# Patient Record
Sex: Female | Born: 1975 | Race: Black or African American | Hispanic: No | Smoking: Former smoker
Health system: Southern US, Community
[De-identification: ages and names within clinical notes are randomized; demographics above are authoritative.]

## PROBLEM LIST (undated history)

## (undated) DIAGNOSIS — C801 Malignant (primary) neoplasm, unspecified: Secondary | ICD-10-CM

## (undated) DIAGNOSIS — I10 Essential (primary) hypertension: Secondary | ICD-10-CM

## (undated) DIAGNOSIS — F419 Anxiety disorder, unspecified: Secondary | ICD-10-CM

## (undated) DIAGNOSIS — F32A Depression, unspecified: Secondary | ICD-10-CM

## (undated) DIAGNOSIS — R011 Cardiac murmur, unspecified: Secondary | ICD-10-CM

## (undated) HISTORY — DX: Anxiety disorder, unspecified: F41.9

## (undated) HISTORY — PX: DILATION AND CURETTAGE OF UTERUS: SHX78

## (undated) HISTORY — DX: Depression, unspecified: F32.A

## (undated) MED FILL — Dexamethasone Sodium Phosphate Inj 100 MG/10ML: INTRAMUSCULAR | Qty: 1 | Status: AC

---

## 2014-04-18 ENCOUNTER — Emergency Department (HOSPITAL_COMMUNITY)
Admission: EM | Admit: 2014-04-18 | Discharge: 2014-04-18 | Disposition: A | Discharge status: Home / Self Care | Payer: Medicaid - Out of State | Attending: Emergency Medicine | Admitting: Emergency Medicine

## 2014-04-18 ENCOUNTER — Encounter (HOSPITAL_COMMUNITY): Payer: Self-pay | Admitting: Emergency Medicine

## 2014-04-18 ENCOUNTER — Telehealth (HOSPITAL_BASED_OUTPATIENT_CLINIC_OR_DEPARTMENT_OTHER): Payer: Self-pay | Admitting: *Deleted

## 2014-04-18 DIAGNOSIS — I10 Essential (primary) hypertension: Secondary | ICD-10-CM | POA: Insufficient documentation

## 2014-04-18 DIAGNOSIS — F17200 Nicotine dependence, unspecified, uncomplicated: Secondary | ICD-10-CM | POA: Insufficient documentation

## 2014-04-18 DIAGNOSIS — H01009 Unspecified blepharitis unspecified eye, unspecified eyelid: Secondary | ICD-10-CM | POA: Insufficient documentation

## 2014-04-18 DIAGNOSIS — H01003 Unspecified blepharitis right eye, unspecified eyelid: Secondary | ICD-10-CM

## 2014-04-18 HISTORY — DX: Essential (primary) hypertension: I10

## 2014-04-18 MED ORDER — ERYTHROMYCIN 5 MG/GM OP OINT: TOPICAL_OINTMENT | OPHTHALMIC | Status: DC

## 2014-04-18 MED ORDER — TETRACAINE HCL 0.5 % OP SOLN
2.0000 [drp] | Freq: Once | OPHTHALMIC | Status: AC
  Administered 2014-04-18: 2 [drp] via OPHTHALMIC
  Filled 2014-04-18: qty 2

## 2014-04-18 MED ORDER — FLUORESCEIN SODIUM 1 MG OP STRP
1.0000 | ORAL_STRIP | Freq: Once | OPHTHALMIC | Status: AC
  Administered 2014-04-18: 1 via OPHTHALMIC
  Filled 2014-04-18: qty 1

## 2014-04-18 NOTE — ED Provider Notes (Signed)
CSN: [id number]     Arrival date & time 04/18/14  [id number] History   First MD Initiated Contact with Patient 04/18/14 0904    This chart was scribed for Sheryl FinnerErin O'Malley PA-C, a non-physician practitioner working with Layla MawKristen N Ward, DO by Lewanda RifeAlexandra Hurtado, ED Scribe. This patient was seen in room TR04C/TR04C and the patient's care was started at 9:31 AM      Chief Complaint  Patient presents with  . Eye Pain  . Belepharitis     (Consider location/radiation/quality/duration/timing/severity/associated sxs/prior Treatment) The history is provided by the patient. No language interpreter was used.   HPI Comments: Sheryl NasutiCassandra Silva is a 38 y.o. female who presents to the Emergency Department complaining of constant right eye pain onset 2 days. States she works in a [employer] Describes pain as pruritic in nature and moderate in severity. Reports associated mild eye drainage, and foreign body sensation. Reports trying Visine with no relief of symptoms. Denies any aggravating factors. Denies associated fever, recent eye trauma, photophobia, and eye redness. Denies wearing contacts.   Past Medical History  Diagnosis Date  . Hypertension    Past Surgical History  Procedure Laterality Date  . Cesarean section     No family history on file. History  Substance Use Topics  . Smoking status: Current Every Day Smoker -- 0.50 packs/day    Types: Cigarettes  . Smokeless tobacco: Not on file  . Alcohol Use: No   OB History   Grav Para Term Preterm Abortions TAB SAB Ect Mult Living                 Review of Systems  Constitutional: Negative for fever.  Eyes: Positive for pain, discharge and itching. Negative for photophobia.      Allergies  Review of patient's allergies indicates no known allergies.  Home Medications   Prior to Admission medications   Medication Sig Start Date End Date Taking? Authorizing Provider  erythromycin ophthalmic ointment Place a 1/2 inch ribbon of ointment  into the lower eyelid. Continue at least 24 hours after symptoms have resolved. 04/18/14   Sheryl FinnerErin O'Malley, PA-C   BP 159/89  Pulse 66  Temp(Src) 98.9 F (37.2 C) (Oral)  Resp 18  Ht 5\' 7"  (1.702 m)  Wt 180 lb (81.647 kg)  BMI 28.19 kg/m2  SpO2 100%  LMP 03/21/2014 Physical Exam  Nursing note and vitals reviewed. Constitutional: She is oriented to person, place, and time. She appears well-developed and well-nourished.  HENT:  Head: Normocephalic and atraumatic.  Eyes: EOM and lids are normal. Pupils are equal, round, and reactive to light. Lids are everted and swept, no foreign bodies found. Right eye exhibits no chemosis, no discharge, no exudate and no hordeolum. No foreign body present in the right eye. Left eye exhibits no chemosis, no discharge, no exudate and no hordeolum. No foreign body present in the left eye. Right conjunctiva is not injected. No scleral icterus.  Slit lamp exam:      The right eye shows no corneal abrasion, no corneal flare, no corneal ulcer, no foreign body, no hyphema, no hypopyon and no fluorescein uptake.  Right eyelid-mild erythema and edema with tenderness.    Visual Acuity  Right Eye Distance: 20/70 Left Eye Distance: 20/50 Bilateral Distance: 20/50   Neck: Normal range of motion.  Cardiovascular: Normal rate.   Pulmonary/Chest: Effort normal.  Musculoskeletal: Normal range of motion.  Neurological: She is alert and oriented to person, place, and time.  Skin: Skin is  warm and dry.  Psychiatric: She has a normal mood and affect. Her behavior is normal.    ED Course  Procedures (including critical care time) COORDINATION OF CARE:  Nursing notes reviewed. Vital signs reviewed. Initial pt interview and examination performed.   Filed Vitals:   04/18/14 0904  BP: 159/89  Pulse: 66  Temp: 98.9 F (37.2 C)  TempSrc: Oral  Resp: 18  Height: 5\' 7"  (1.702 m)  Weight: 180 lb (81.647 kg)  SpO2: 100%    9:31 AM-Discussed work up plan with pt  at bedside, which includes  Orders Placed This Encounter  Procedures  . Visual acuity screening    Standing Status: Standing     Number of Occurrences: 1     Standing Expiration Date:   . Pt agrees with plan.   Treatment plan initiated: Medications  tetracaine (PONTOCAINE) 0.5 % ophthalmic solution 2 drop (2 drops Right Eye Given 04/18/14 0919)  fluorescein ophthalmic strip 1 strip (1 strip Right Eye Given 04/18/14 0920)     Initial diagnostic testing ordered.       Labs Review Labs Reviewed - No data to display  Imaging Review No results found.   EKG Interpretation None      MDM   Final diagnoses:  Blepharitis of right eye   Pt is a 38yo female c/o 2 day hx of right eye erythema, irritation and edema with foreign body sensation. No foreign body seen on exam.  No corneal abrasion. Signs and symptoms consistent with blepharitis. Will tx with erythromycin ointment and advised to use warm compresses. Advised to f/u with Dr. Gwen PoundsKowalski, ophthalmology, in 2-3 days if not improving. Return precautions provided. Pt verbalized understanding and agreement with tx plan.    I personally performed the services described in this documentation, which was scribed in my presence. The recorded information has been reviewed and is accurate.    Sheryl FinnerErin O'Malley, PA-C 04/18/14 [phone]

## 2014-04-18 NOTE — ED Notes (Signed)
Patient refused wheelchair.  Walked patient out.

## 2014-04-18 NOTE — ED Notes (Signed)
Patient states started having eyelid swelling about 2 days ago.  Patient states some minor drainage.

## 2014-04-18 NOTE — ED Provider Notes (Signed)
Medical screening examination/treatment/procedure(s) were performed by non-physician practitioner and as supervising physician I was immediately available for consultation/collaboration.   EKG Interpretation None        Layla MawKristen N Ward, DO 04/18/14 1244

## 2014-04-18 NOTE — Discharge Instructions (Signed)
Blepharitis Blepharitis is redness, soreness, and swelling (inflammation) of one or both eyelids. It may be caused by an allergic reaction or a bacterial infection. Blepharitis may also be associated with reddened, scaly skin (seborrhea) of the scalp and eyebrows. While you sleep, eye discharge may cause your eyelashes to stick together. Your eyelids may itch, burn, swell, and may lose their lashes. These will grow back. Your eyes may become sensitive. Blepharitis may recur and need repeated treatment. If this is the case, you may require further evaluation by an eye specialist (ophthalmologist). HOME CARE INSTRUCTIONS   Keep your hands clean.  Use a clean towel each time you dry your eyelids. Do not use this towel to clean other areas. Do not share a towel or makeup with anyone.  Wash your eyelids with warm water or warm water mixed with a small amount of baby shampoo. Do this twice a day or as often as needed.  Wash your face and eyebrows at least once a day.  Use warm compresses 2 times a day for 10 minutes at a time, or as directed by your caregiver.  Apply antibiotic ointment as directed by your caregiver.  Avoid rubbing your eyes.  Avoid wearing makeup until you get better.  Follow up with your caregiver as directed. SEEK IMMEDIATE MEDICAL CARE IF:   You have pain, redness, or swelling that gets worse or spreads to other parts of your face.  Your vision changes, or you have pain when looking at lights or moving objects.  You have a fever.  Your symptoms continue for longer than 2 to 4 days or become worse. MAKE SURE YOU:   Understand these instructions.  Will watch your condition.  Will get help right away if you are not doing well or get worse. Document Released: 10/15/2000 Document Revised: 01/10/2012 Document Reviewed: 11/25/2010 ExitCare Patient Information 2015 [employer]. This information is not intended to replace advice given to you by your health care  provider. Make sure you discuss any questions you have with your health care provider.  

## 2014-04-25 ENCOUNTER — Emergency Department (HOSPITAL_COMMUNITY)
Admission: EM | Admit: 2014-04-25 | Discharge: 2014-04-25 | Disposition: A | Discharge status: Home / Self Care | Payer: Medicaid - Out of State | Attending: Emergency Medicine | Admitting: Emergency Medicine

## 2014-04-25 ENCOUNTER — Encounter (HOSPITAL_COMMUNITY): Payer: Self-pay | Admitting: Emergency Medicine

## 2014-04-25 DIAGNOSIS — F17200 Nicotine dependence, unspecified, uncomplicated: Secondary | ICD-10-CM | POA: Insufficient documentation

## 2014-04-25 DIAGNOSIS — Z79899 Other long term (current) drug therapy: Secondary | ICD-10-CM | POA: Insufficient documentation

## 2014-04-25 DIAGNOSIS — H00013 Hordeolum externum right eye, unspecified eyelid: Secondary | ICD-10-CM

## 2014-04-25 DIAGNOSIS — H00019 Hordeolum externum unspecified eye, unspecified eyelid: Secondary | ICD-10-CM | POA: Insufficient documentation

## 2014-04-25 DIAGNOSIS — I10 Essential (primary) hypertension: Secondary | ICD-10-CM | POA: Insufficient documentation

## 2014-04-25 NOTE — ED Notes (Signed)
Pt from home with c/o worsening eye lid swelling.  Pt states she was seen last week for the same but it is getting worse and is more painful.  Pt in NAD, A&O.

## 2014-04-25 NOTE — Discharge Instructions (Signed)

## 2014-04-25 NOTE — ED Notes (Signed)
Observed room was empty and pt was gone.  Discussed with PA Konrad DoloresMerrell, who stated she had not seen the pt recently and believed the pt has left already.  Pt left prior to discharge paperwork and additional vital signs.

## 2014-04-25 NOTE — ED Provider Notes (Signed)
Medical screening examination/treatment/procedure(s) were performed by non-physician practitioner and as supervising physician I was immediately available for consultation/collaboration.   EKG Interpretation None        Joya Gaskinsonald W Wickline, MD 04/25/14 2001

## 2014-04-25 NOTE — ED Provider Notes (Signed)
CSN: [id number]     Arrival date & time 04/25/14  0803 History   First MD Initiated Contact with Patient 04/25/14 [phone]     Chief Complaint  Patient presents with  . Facial Swelling     (Consider location/radiation/quality/duration/timing/severity/associated sxs/prior Treatment) HPI Comments: Patient is a 38 year old female with history of hypertension who presents today with right eyelid swelling. She reports that she was seen last week for similar symptoms. When she was seen previously she was having pain, itching, drainage. She was given erythromycin ointment and instructed to use warm compresses. She has been using these medications as prescribed. She denies any blurry vision, double vision, photophobia, eye redness. She does not wear contacts. She reports that her symptoms have generally subsided with the exception of the unsightly swelling to the top of her eyelid. She was unable to see an ophthalmologist because of her insurance. She has out of state Medicaid.   The history is provided by the patient. No language interpreter was used.    Past Medical History  Diagnosis Date  . Hypertension    Past Surgical History  Procedure Laterality Date  . Cesarean section     No family history on file. History  Substance Use Topics  . Smoking status: Current Every Day Smoker -- 0.50 packs/day    Types: Cigarettes  . Smokeless tobacco: Never Used  . Alcohol Use: No   OB History   Grav Para Term Preterm Abortions TAB SAB Ect Mult Living                 Review of Systems  Constitutional: Negative for fever and chills.  Eyes: Negative for photophobia, pain, discharge, redness, itching and visual disturbance.       Eyelid swelling  Gastrointestinal: Negative for nausea and vomiting.  All other systems reviewed and are negative.     Allergies  Review of patient's allergies indicates no known allergies.  Home Medications   Prior to Admission medications   Medication Sig Start  Date End Date Taking? Authorizing Provider  atenolol-chlorthalidone (TENORETIC) 50-25 MG per tablet Take 1 tablet by mouth daily.  01/23/14  Yes Historical Provider, MD  ibuprofen (ADVIL,MOTRIN) 800 MG tablet Take 800 mg by mouth every 8 (eight) hours as needed for headache or cramping.  01/25/14  Yes Historical Provider, MD   BP 127/72  Temp(Src) 98.2 F (36.8 C) (Oral)  Resp 18  Ht 5\' 6"  (1.676 m)  Wt 180 lb (81.647 kg)  BMI 29.07 kg/m2  LMP 04/24/2014 Physical Exam  Nursing note and vitals reviewed. Constitutional: She is oriented to person, place, and time. She appears well-developed and well-nourished. No distress.  Patient is very well appearing  HENT:  Head: Normocephalic and atraumatic.  Right Ear: External ear normal.  Left Ear: External ear normal.  Nose: Nose normal.  Mouth/Throat: Oropharynx is clear and moist.  Eyes: Conjunctivae and EOM are normal. Pupils are equal, round, and reactive to light. Right eye exhibits hordeolum. Right eye exhibits no chemosis, no discharge and no exudate. No foreign body present in the right eye. Left eye exhibits no chemosis, no discharge and no exudate. No foreign body present in the left eye. Right conjunctiva is not injected. Right conjunctiva has no hemorrhage. Left conjunctiva is not injected. Left conjunctiva has no hemorrhage. Right eye exhibits normal extraocular motion and no nystagmus. Left eye exhibits normal extraocular motion and no nystagmus. Right pupil is round and reactive. Left pupil is round and reactive.  Right eyelid  with mild swelling to mid lid near eyelashes. No tenderness to palpation. No surrounding orbital erythema, streaking.  Neck: Normal range of motion.  Cardiovascular: Normal rate, regular rhythm and normal heart sounds.   Pulmonary/Chest: Effort normal and breath sounds normal. No stridor. No respiratory distress. She has no wheezes. She has no rales.  Abdominal: Soft. She exhibits no distension.  Musculoskeletal:  Normal range of motion.  Neurological: She is alert and oriented to person, place, and time. She has normal strength.  Skin: Skin is warm and dry. She is not diaphoretic. No erythema.  Psychiatric: She has a normal mood and affect. Her behavior is normal.    ED Course  Procedures (including critical care time) Labs Review Labs Reviewed - No data to display  Imaging Review No results found.   EKG Interpretation None      MDM   Final diagnoses:  Stye external, right   Patient presents to ED for evaluation of right eye swelling. Patient with small, focal swelling to right mid eyelid, along eyelash line. No active drainage or warmth. Patient without symptoms other than this swelling. Patient has been using warm compresses 2 x a day and erythromycin ointment. She cannot see opthalmology due to insurance issues. She becomes very angry with my diagnosis and states I am withholding care and not letting her see a specialist in the ED due to her insurance. I explained to patient this was not the case, that there were no in house opthamologist and that there were no emergent conditions present requiring the opthamologist to come to the ED. I offered Dr. Bebe ShaggyWickline to evaluate patient for second opinion. Patient agreed to this plan, but eloped from the department shortly after. He was unable to evaluate this patient. She eloped from the department before a visual acuity was able to be obtained. Patient without complaints of visual disturbance and I believe patient can still be discharged in good faith.  I encouraged patient to follow up with opthalmology, increase number of warm compresses done daily, and return to the ED for any new pain, swelling, visual disturbance, or any symptom that is concerning to her. Vital signs stable for discharge. Patient / Family / Caregiver informed of clinical course, understand medical decision-making process, and agree with plan.     Mora BellmanHannah S Kylar Speelman, PA-C 04/25/14  1334

## 2017-03-25 ENCOUNTER — Encounter: Payer: Self-pay | Admitting: Emergency Medicine

## 2017-03-25 ENCOUNTER — Emergency Department
Admission: EM | Admit: 2017-03-25 | Discharge: 2017-03-25 | Disposition: A | Discharge status: Home / Self Care | Payer: BLUE CROSS/BLUE SHIELD | Attending: Emergency Medicine | Admitting: Emergency Medicine

## 2017-03-25 DIAGNOSIS — F329 Major depressive disorder, single episode, unspecified: Secondary | ICD-10-CM | POA: Insufficient documentation

## 2017-03-25 DIAGNOSIS — F17210 Nicotine dependence, cigarettes, uncomplicated: Secondary | ICD-10-CM | POA: Insufficient documentation

## 2017-03-25 DIAGNOSIS — F32A Depression, unspecified: Secondary | ICD-10-CM

## 2017-03-25 DIAGNOSIS — I10 Essential (primary) hypertension: Secondary | ICD-10-CM | POA: Insufficient documentation

## 2017-03-25 DIAGNOSIS — F419 Anxiety disorder, unspecified: Secondary | ICD-10-CM | POA: Diagnosis not present

## 2017-03-25 MED ORDER — ESCITALOPRAM OXALATE 10 MG PO TABS: 10.0000 mg | ORAL_TABLET | Freq: Every day | ORAL | 2 refills | Status: DC

## 2017-03-25 MED ORDER — LORAZEPAM 0.5 MG PO TABS: 0.5000 mg | ORAL_TABLET | Freq: Three times a day (TID) | ORAL | 0 refills | Status: AC | PRN

## 2017-03-25 MED ORDER — LORAZEPAM 1 MG PO TABS
1.0000 mg | ORAL_TABLET | Freq: Once | ORAL | Status: AC
  Administered 2017-03-25: 1 mg via ORAL
  Filled 2017-03-25: qty 1

## 2017-03-25 NOTE — ED Notes (Addendum)
Pt dressed out into appropriate behavioral health clothing. Pt belongings consist of a white necklace with a cross, a pair of white ball earrings, a colorful purse,a cell phone, white flip flops, a pair of blue jeans, a black belt, a black shirt, a black bra and a pair of Menna panties. Pt boyfriend Melina ModenaJerome Payton took all pt belongings with him to lobby.

## 2017-03-25 NOTE — ED Notes (Signed)
TTS with pt  

## 2017-03-25 NOTE — ED Notes (Signed)
Pt. Verbalizes understanding of d/c instructions and follow-up. VS stable and pain controlled per pt.  Pt. In NAD at time of d/c and denies further concerns regarding this visit. Resources provided by TTS. Pt. Stable at the time of departure from the unit, departing unit by the safest and most appropriate manner per that pt condition and limitations. Pt advised to return to the ED at any time for emergent concerns, or for new/worsening symptoms.
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
## 2017-03-25 NOTE — ED Notes (Signed)
Discussed patient with ER MD (Dr. Rance MuirWillams) and patient is able to discharge home when medically cleared. Patient was giving referral information and instructions on how to follow up with Outpatient Treatment ([facility] and [facility]) and Mobile Crisis.  Writer also advised the patient to call the toll free phone on his insurance card for the counselors and agencies in his network.  Patient advised to call the [facility] through their job to help with Outpatient Treatment.  Patient denies SI/HI and AV/H.

## 2017-03-25 NOTE — ED Notes (Signed)
Melina ModenaJerome Payton (pt boyfriend) [phone].

## 2017-03-25 NOTE — ED Triage Notes (Signed)
Pt to ED via POV for c/o depression and generalized body aches. Pt states that she has been feeling depressed for the last 3-4 days. Pt states that she has been under a lot of stress recently. Pt states that she has barely been able to get out of the bed the past few days. Pt states that she is unable a lot of stress at her job because the "environment is too much". Pt denies SI/HI.

## 2017-05-23 DIAGNOSIS — Z720 Tobacco use: Secondary | ICD-10-CM | POA: Insufficient documentation

## 2017-05-23 DIAGNOSIS — R7989 Other specified abnormal findings of blood chemistry: Secondary | ICD-10-CM | POA: Insufficient documentation

## 2017-08-12 ENCOUNTER — Encounter: Payer: Self-pay | Admitting: Emergency Medicine

## 2017-08-12 ENCOUNTER — Emergency Department
Admission: EM | Admit: 2017-08-12 | Discharge: 2017-08-12 | Disposition: A | Discharge status: Home / Self Care | Payer: BLUE CROSS/BLUE SHIELD | Attending: Emergency Medicine | Admitting: Emergency Medicine

## 2017-08-12 DIAGNOSIS — I10 Essential (primary) hypertension: Secondary | ICD-10-CM | POA: Diagnosis not present

## 2017-08-12 DIAGNOSIS — F17210 Nicotine dependence, cigarettes, uncomplicated: Secondary | ICD-10-CM | POA: Insufficient documentation

## 2017-08-12 DIAGNOSIS — N939 Abnormal uterine and vaginal bleeding, unspecified: Secondary | ICD-10-CM | POA: Diagnosis not present

## 2017-08-12 DIAGNOSIS — Z79899 Other long term (current) drug therapy: Secondary | ICD-10-CM | POA: Diagnosis not present

## 2017-08-12 LAB — CBC WITH DIFFERENTIAL/PLATELET
Basophils Absolute: 0.1 10*3/uL (ref 0–0.1)
Basophils Relative: 1 %
Eosinophils Absolute: 0.1 10*3/uL (ref 0–0.7)
Eosinophils Relative: 2 %
HEMATOCRIT: 40.2 % (ref 35.0–47.0)
HEMOGLOBIN: 13.9 g/dL (ref 12.0–16.0)
LYMPHS PCT: 44 %
Lymphs Abs: 2.8 10*3/uL (ref 1.0–3.6)
MCH: 32.1 pg (ref 26.0–34.0)
MCHC: 34.6 g/dL (ref 32.0–36.0)
MCV: 92.7 fL (ref 80.0–100.0)
Monocytes Absolute: 0.4 10*3/uL (ref 0.2–0.9)
Monocytes Relative: 6 %
NEUTROS ABS: 2.9 10*3/uL (ref 1.4–6.5)
Neutrophils Relative %: 47 %
Platelets: 251 10*3/uL (ref 150–440)
RBC: 4.34 MIL/uL (ref 3.80–5.20)
RDW: 13 % (ref 11.5–14.5)
WBC: 6.3 10*3/uL (ref 3.6–11.0)

## 2017-08-12 LAB — URINALYSIS, COMPLETE (UACMP) WITH MICROSCOPIC
Bilirubin Urine: NEGATIVE
Glucose, UA: NEGATIVE mg/dL
KETONES UR: NEGATIVE mg/dL
Nitrite: NEGATIVE
PH: 6 (ref 5.0–8.0)
Protein, ur: NEGATIVE mg/dL
Specific Gravity, Urine: 1.006 (ref 1.005–1.030)

## 2017-08-12 LAB — BASIC METABOLIC PANEL
ANION GAP: 6 (ref 5–15)
BUN: 9 mg/dL (ref 6–20)
CO2: 26 mmol/L (ref 22–32)
Calcium: 9.5 mg/dL (ref 8.9–10.3)
Chloride: 107 mmol/L (ref 101–111)
Creatinine, Ser: 1.07 mg/dL — ABNORMAL HIGH (ref 0.44–1.00)
GFR calc Af Amer: >60 mL/min (ref >60)
GFR calc non Af Amer: >60 mL/min (ref >60)
Glucose, Bld: 94 mg/dL (ref 65–99)
POTASSIUM: 4.4 mmol/L (ref 3.5–5.1)
Sodium: 139 mmol/L (ref 135–145)

## 2017-08-12 LAB — POCT PREGNANCY, URINE: Preg Test, Ur: NEGATIVE

## 2017-08-12 MED ORDER — TRANEXAMIC ACID 650 MG PO TABS: 1300.0000 mg | ORAL_TABLET | Freq: Three times a day (TID) | ORAL | 0 refills | Status: AC

## 2017-08-12 MED ORDER — DICYCLOMINE HCL 20 MG PO TABS: 20.0000 mg | ORAL_TABLET | Freq: Three times a day (TID) | ORAL | 0 refills | Status: DC | PRN

## 2017-08-12 NOTE — ED Notes (Signed)
Pt alert and oriented X4, active, cooperative, pt in NAD. RR even and unlabored, color WNL.  Pt informed to return if any life threatening symptoms occur.  Discharge and followup instructions reviewed.  

## 2017-08-12 NOTE — ED Provider Notes (Signed)
[facility] Emergency Department Provider Note  ____________________________________________   I have reviewed the triage vital signs and the nursing notes.   HISTORY  Chief Complaint Vaginal Bleeding   History limited by: Not Limited   HPI Sheryl Silva is a 41 y.o. female who presents to the emergency department today because of continued vaginal bleeding.   LOCATION:vaginal DURATION:1 month TIMING: constant QUALITY: blood clots CONTEXT: patient has been having bleeding for one month. Roughly 2 weeks ago saw an ob/gyn doctor. States she had US done and a biopsy without abnormal findings. Was placed on Provera.  MODIFYING FACTORS: Provera has not helped the bleeding ASSOCIATED SYMPTOMS: abdominal discomfort.  Per medical record review patient has a history of irregular periods.  Past Medical History:  Diagnosis Date  . Hypertension     There are no active problems to display for this patient.   Past Surgical History:  Procedure Laterality Date  . CESAREAN SECTION      Prior to Admission medications   Medication Sig Start Date End Date Taking? Authorizing Provider  atenolol-chlorthalidone (TENORETIC) 50-25 MG per tablet Take 1 tablet by mouth daily.  01/23/14   [provider]  escitalopram (LEXAPRO) 10 MG tablet Take 1 tablet (10 mg total) by mouth daily. 03/25/17 03/25/18  Emily Filbert, MD  ibuprofen (ADVIL,MOTRIN) 800 MG tablet Take 800 mg by mouth every 8 (eight) hours as needed for headache or cramping.  01/25/14   [provider]  LORazepam (ATIVAN) 0.5 MG tablet Take 1 tablet (0.5 mg total) by mouth every 8 (eight) hours as needed for anxiety. 03/25/17 03/25/18  Emily Filbert, MD    Allergies Patient has no known allergies.  No family history on file.  Social History Social History  Substance Use Topics  . Smoking status: Current Every Day Smoker    Packs/day: 0.50    Types: Cigarettes  .  Smokeless tobacco: Never Used  . Alcohol use Yes     Comment: occ    Review of Systems Constitutional: No fever/chills Eyes: No visual changes. ENT: No sore throat. Cardiovascular: Denies chest pain. Respiratory: Denies shortness of breath. Gastrointestinal: Positive for abdominal pain. Genitourinary: Positive for vaginal bleeding. Musculoskeletal: Negative for back pain. Skin: Negative for rash. Neurological: Negative for headaches, focal weakness or numbness.  ____________________________________________   PHYSICAL EXAM:  VITAL SIGNS: ED Triage Vitals  Enc Vitals Group     BP 08/12/17 1716 (!) 162/98     Pulse Rate 08/12/17 1716 72     Resp 08/12/17 1716 16     Temp 08/12/17 1716 98.9 F (37.2 C)     Temp Source 08/12/17 1716 Oral     SpO2 08/12/17 1716 100 %     Weight 08/12/17 1718 180 lb (81.6 kg)     Height --      Head Circumference --      Peak Flow --      Pain Score 08/12/17 1716 9    Constitutional: Alert and oriented. Well appearing and in no distress. Eyes: Conjunctivae are normal.  ENT   Head: Normocephalic and atraumatic.   Nose: No congestion/rhinnorhea.   Mouth/Throat: Mucous membranes are moist.   Neck: No stridor. Hematological/Lymphatic/Immunilogical: No cervical lymphadenopathy. Cardiovascular: Normal rate, regular rhythm.  No murmurs, rubs, or gallops.  Respiratory: Normal respiratory effort without tachypnea nor retractions. Breath sounds are clear and equal bilaterally. No wheezes/rales/rhonchi. Gastrointestinal: Soft and non tender. No rebound. No guarding.  Genitourinary: Deferred Musculoskeletal: Normal range  of motion in all extremities. No lower extremity edema. Neurologic:  Normal speech and language. No gross focal neurologic deficits are appreciated.  Skin:  Skin is warm, dry and intact. No rash noted. Psychiatric: Mood and affect are normal. Speech and behavior are normal. Patient exhibits appropriate insight and  judgment.  ____________________________________________    LABS (pertinent positives/negatives)  preg neg Cbc wnl Bmp cr 1.07 ua with trace leukocytes, no wbcs. 6-30 rbcs.  ____________________________________________   EKG  None  ____________________________________________    RADIOLOGY  None  ____________________________________________   PROCEDURES  Procedures  ____________________________________________   INITIAL IMPRESSION / ASSESSMENT AND PLAN / ED COURSE  Pertinent labs & imaging results that were available during my care of the patient were reviewed by me and considered in my medical decision making (see chart for details).  patient presents to the emergency department today because of concerns for vaginal bleeding and abdominal pain. Differential would include cancer, fibroids, abnormal endometrial growth among other etiologies. Patient did state however she had an ultrasound and biopsy done roughly 2 weeks ago which were both normal. She was placed on Provera. I discussed with patient about importance of continuing OB/GYN follow-up. Blood work today without any concerning anemia from the bleeding. Did discuss other medications to try for abnormal uterine bleeding. Did discussed the x-ray. Patient did put on a hormonal treatment without relief. Discuss possible risk of other blood clots developing with TX a. Patient however would like to try. Also give prescription for Bentyl. Discussed with patient importance of OB/GYN follow-up.  ____________________________________________   FINAL CLINICAL IMPRESSION(S) / ED DIAGNOSES  Final diagnoses:  Abnormal uterine bleeding (AUB)     Note: This dictation was prepared with Dragon dictation. Any transcriptional errors that result from this process are unintentional     Phineas Semen, MD 08/12/17 2100

## 2017-08-12 NOTE — ED Triage Notes (Signed)
Pt to ED via POV c/o vaginal bleeding x 30 days. Pt states that she seen her OBGYN when she was in [place] 2 weeks ago and was given Provera. Pt states that she has 4 days left but she is still bleeding. Pt had an ultrasound done and states that it came back ok. Pt states that she is passing some clots. Pt states that she is using 2 pads per hours at times but her flow varies.

## 2017-08-12 NOTE — Discharge Instructions (Signed)
Please stop taking your oral hormone treatment and start taking the medication prescribed today. Please talk to your primary care doctor about following up with an ob/gyn. Please seek medical attention for any high fevers, chest pain, shortness of breath, change in behavior, persistent vomiting, bloody stool or any other new or concerning symptoms.

## 2018-09-27 ENCOUNTER — Other Ambulatory Visit: Payer: Self-pay

## 2018-09-27 ENCOUNTER — Emergency Department
Admission: EM | Admit: 2018-09-27 | Discharge: 2018-09-27 | Disposition: A | Discharge status: Home / Self Care | Payer: BLUE CROSS/BLUE SHIELD | Attending: Emergency Medicine | Admitting: Emergency Medicine

## 2018-09-27 DIAGNOSIS — I10 Essential (primary) hypertension: Secondary | ICD-10-CM | POA: Insufficient documentation

## 2018-09-27 DIAGNOSIS — Z79899 Other long term (current) drug therapy: Secondary | ICD-10-CM | POA: Insufficient documentation

## 2018-09-27 DIAGNOSIS — R519 Headache, unspecified: Secondary | ICD-10-CM

## 2018-09-27 DIAGNOSIS — F17210 Nicotine dependence, cigarettes, uncomplicated: Secondary | ICD-10-CM | POA: Insufficient documentation

## 2018-09-27 DIAGNOSIS — R51 Headache: Secondary | ICD-10-CM | POA: Insufficient documentation

## 2018-09-27 LAB — COMPREHENSIVE METABOLIC PANEL
ALK PHOS: 46 U/L (ref 38–126)
ALT: 22 U/L (ref 0–44)
AST: 24 U/L (ref 15–41)
Albumin: 4 g/dL (ref 3.5–5.0)
Anion gap: 10 (ref 5–15)
BUN: 15 mg/dL (ref 6–20)
CALCIUM: 9.2 mg/dL (ref 8.9–10.3)
CHLORIDE: 104 mmol/L (ref 98–111)
CO2: 23 mmol/L (ref 22–32)
CREATININE: 0.95 mg/dL (ref 0.44–1.00)
GFR calc Af Amer: >60 mL/min (ref >60)
GFR calc non Af Amer: >60 mL/min (ref >60)
Glucose, Bld: 94 mg/dL (ref 70–99)
Potassium: 4.3 mmol/L (ref 3.5–5.1)
Sodium: 137 mmol/L (ref 135–145)
Total Bilirubin: 0.4 mg/dL (ref 0.3–1.2)
Total Protein: 7.2 g/dL (ref 6.5–8.1)

## 2018-09-27 LAB — CBC
HCT: 44.5 % (ref 36.0–46.0)
HEMOGLOBIN: 15.1 g/dL — AB (ref 12.0–15.0)
MCH: 30.6 pg (ref 26.0–34.0)
MCHC: 33.9 g/dL (ref 30.0–36.0)
MCV: 90.3 fL (ref 80.0–100.0)
PLATELETS: 270 10*3/uL (ref 150–400)
RBC: 4.93 MIL/uL (ref 3.87–5.11)
RDW: 12.4 % (ref 11.5–15.5)
WBC: 7.2 10*3/uL (ref 4.0–10.5)
nRBC: 0 % (ref 0.0–0.2)

## 2018-09-27 LAB — TROPONIN I: Troponin I: <0.03 ng/mL (ref <0.03)

## 2018-09-27 MED ORDER — BUTALBITAL-APAP-CAFFEINE 50-325-40 MG PO TABS
1.0000 | ORAL_TABLET | Freq: Once | ORAL | Status: AC
  Administered 2018-09-27: 1 via ORAL
  Filled 2018-09-27: qty 1

## 2018-09-27 NOTE — Discharge Instructions (Addendum)
As discussed it is very important you take your blood pressure medication daily and as prescribed. Please seek medical attention for any high fevers, chest pain, shortness of breath, change in behavior, persistent vomiting, bloody stool or any other new or concerning symptoms.

## 2018-09-27 NOTE — ED Provider Notes (Signed)
[facility] Emergency Department Provider Note  _________________________________________   I have reviewed the triage vital signs and the nursing notes.   HISTORY  Chief Complaint Hypertension   History limited by: Not Limited   HPI Sheryl Silva is a 42 y.o. female who presents to the emergency department today with complaints for high blood pressure as well as headache.  Patient states that she has a long history of hypertension however does not consistently take her blood pressure medications.  She says that she is not taking her blood pressure medications in roughly 1 month.  Over the past couple days she has noticed some headache as well as feelings of dizziness.  [facility] and noted that her blood pressure was elevated.  She checked it a couple of times over the past few days.  Highest systolic she said was in the low 200s.  She did take blood pressure medication today before arrival and at the time my exam states that she is feeling better except for a slight headache.  Per medical record review patient has a history of HTN  Past Medical History:  Diagnosis Date  . Hypertension     There are no active problems to display for this patient.   Past Surgical History:  Procedure Laterality Date  . CESAREAN SECTION      Prior to Admission medications   Medication Sig Start Date End Date Taking? Authorizing Provider  atenolol-chlorthalidone (TENORETIC) 50-25 MG per tablet Take 1 tablet by mouth daily.  01/23/14   [provider]  dicyclomine (BENTYL) 20 MG tablet Take 1 tablet (20 mg total) by mouth 3 (three) times daily as needed (abdominal pain). 08/12/17   Phineas SemenGoodman, Morghan Kester, MD  escitalopram (LEXAPRO) 10 MG tablet Take 1 tablet (10 mg total) by mouth daily. 03/25/17 03/25/18  Emily FilbertWilliams, Jonathan E, MD  ibuprofen (ADVIL,MOTRIN) 800 MG tablet Take 800 mg by mouth every 8 (eight) hours as needed for headache or cramping.  01/25/14    [provider]    Allergies Patient has no known allergies.  No family history on file.  Social History Social History   Tobacco Use  . Smoking status: Current Every Day Smoker    Packs/day: 0.50    Types: Cigarettes  . Smokeless tobacco: Never Used  Substance Use Topics  . Alcohol use: Yes    Comment: occ  . Drug use: No    Review of Systems Constitutional: No fever/chills Eyes: No visual changes. ENT: No sore throat. Cardiovascular: Denies chest pain. Respiratory: Denies shortness of breath. Gastrointestinal: No abdominal pain.  No nausea, no vomiting.  No diarrhea.   Genitourinary: Negative for dysuria. Musculoskeletal: Negative for back pain. Skin: Negative for rash. Neurological: Positive for headache. Positive for dizziness.   ____________________________________________   PHYSICAL EXAM:  VITAL SIGNS: ED Triage Vitals  Enc Vitals Group     BP 09/27/18 2050 (!) 168/99     Pulse Rate 09/27/18 2050 62     Resp 09/27/18 2050 20     Temp 09/27/18 2050 98.2 F (36.8 C)     Temp Source 09/27/18 2050 Oral     SpO2 09/27/18 2050 100 %     Weight 09/27/18 2051 180 lb (81.6 kg)     Height 09/27/18 2051 5\' 6"  (1.676 m)     Head Circumference --      Peak Flow --      Pain Score 09/27/18 2051 9   Constitutional: Alert and oriented.  Eyes: Conjunctivae are normal.  ENT      Head: Normocephalic and atraumatic.      Nose: No congestion/rhinnorhea.      Mouth/Throat: Mucous membranes are moist.      Neck: No stridor. Hematological/Lymphatic/Immunilogical: No cervical lymphadenopathy. Cardiovascular: Normal rate, regular rhythm.  No murmurs, rubs, or gallops.  Respiratory: Normal respiratory effort without tachypnea nor retractions. Breath sounds are clear and equal bilaterally. No wheezes/rales/rhonchi. Gastrointestinal: Soft and non tender. No rebound. No guarding.  Genitourinary: Deferred Musculoskeletal: Normal range of motion in all extremities.  No lower extremity edema. Neurologic:  Normal speech and language. No gross focal neurologic deficits are appreciated.  Skin:  Skin is warm, dry and intact. No rash noted. Psychiatric: Mood and affect are normal. Speech and behavior are normal. Patient exhibits appropriate insight and judgment.  ____________________________________________    LABS (pertinent positives/negatives)  Trop <0.03 CMP wnl CBC wbc 7.2, hgb 15.1, plt 270  ____________________________________________   EKG  I, Phineas Semen, attending physician, personally viewed and interpreted this EKG  EKG Time: 2053 Rate: 53 Rhythm: sinus bradycardia Axis: normal Intervals: qtc 424 QRS: narrow ST changes: no st elevation Impression: abnormal ekg  ____________________________________________    RADIOLOGY  None  ____________________________________________   PROCEDURES  Procedures  ____________________________________________   INITIAL IMPRESSION / ASSESSMENT AND PLAN / ED COURSE  Pertinent labs & imaging results that were available during my care of the patient were reviewed by me and considered in my medical decision making (see chart for details).   Patient presented to the emergency department today with concerns for high blood pressure as well as some headache.  Terms of the blood pressure it is a lower than what she was reporting at home.  No evidence of kidney or heart injury.  Discussed this with the patient.  Had a discussion about the importance of taking her blood pressure medication to prevent long-term chronic illness. In terms of the headache doubt intracranial bleed at this point. Did improve with lower blood pressure. No focal neuro deficits.  Will discharge follow-up with primary care.  ____________________________________________   FINAL CLINICAL IMPRESSION(S) / ED DIAGNOSES  Final diagnoses:  Hypertension, unspecified type  Bad headache     Note: This dictation was prepared  with Dragon dictation. Any transcriptional errors that result from this process are unintentional     Phineas Semen, MD 09/27/18 2253

## 2018-09-27 NOTE — ED Notes (Signed)
Pt stated that she has not taken her BP medication for the past week (pt stated that she felt like she did not need them). Pt stated that for the past 2 days she has been feeling weak, headache and off balance. Pt stated that she took her BP medication twice today last dose was around 2pm. Pt stated that the only issue she is having now is that her head is still hurting.

## 2018-09-27 NOTE — ED Triage Notes (Signed)
Pt in with co headache and sweatiness, states feels hot. Has been off bp med for a week, started back yesterday.

## 2020-03-19 ENCOUNTER — Telehealth: Payer: Self-pay

## 2020-03-19 NOTE — Telephone Encounter (Signed)
I returned patient's call regarding scheduling a [facility] appointment , c/o breast pain. Patient states she is closer to the [employer], and has scheduled an appointment with them.

## 2020-03-26 ENCOUNTER — Encounter: Payer: Self-pay | Admitting: *Deleted

## 2020-03-26 ENCOUNTER — Other Ambulatory Visit: Payer: Self-pay

## 2020-03-26 ENCOUNTER — Ambulatory Visit
Admission: RE | Admit: 2020-03-26 | Discharge: 2020-03-26 | Disposition: A | Discharge status: Home / Self Care | Payer: Self-pay | Source: Ambulatory Visit | Attending: Oncology | Admitting: Oncology

## 2020-03-26 ENCOUNTER — Ambulatory Visit: Payer: Medicaid Other | Attending: Oncology | Admitting: *Deleted

## 2020-03-26 VITALS — BP 123/85 | HR 65 | Temp 98.2°F | Ht 68.0 in | Wt 189.0 lb

## 2020-03-26 DIAGNOSIS — Z0000 Encounter for general adult medical examination without abnormal findings: Secondary | ICD-10-CM

## 2020-03-26 IMAGING — MG DIGITAL SCREENING BILAT W/ TOMO W/ CAD
8 series · 8 of 24 positions shown · non-contrast
Comparison: None.

CLINICAL DATA: Screening.

EXAM:
DIGITAL SCREENING BILATERAL MAMMOGRAM WITH TOMO AND CAD

[R CC synth-2D]
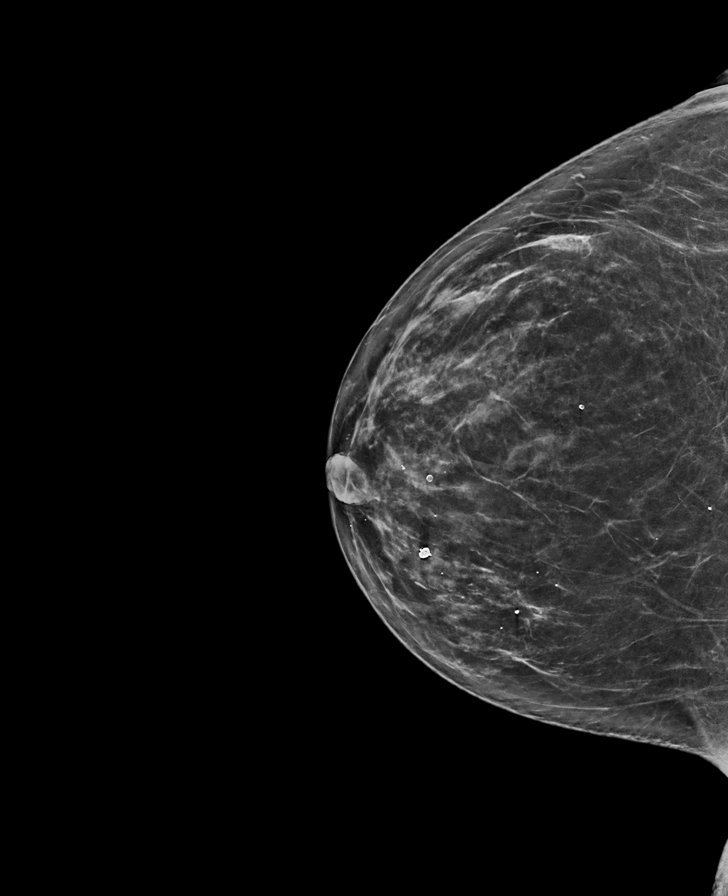

[L CC synth-2D]
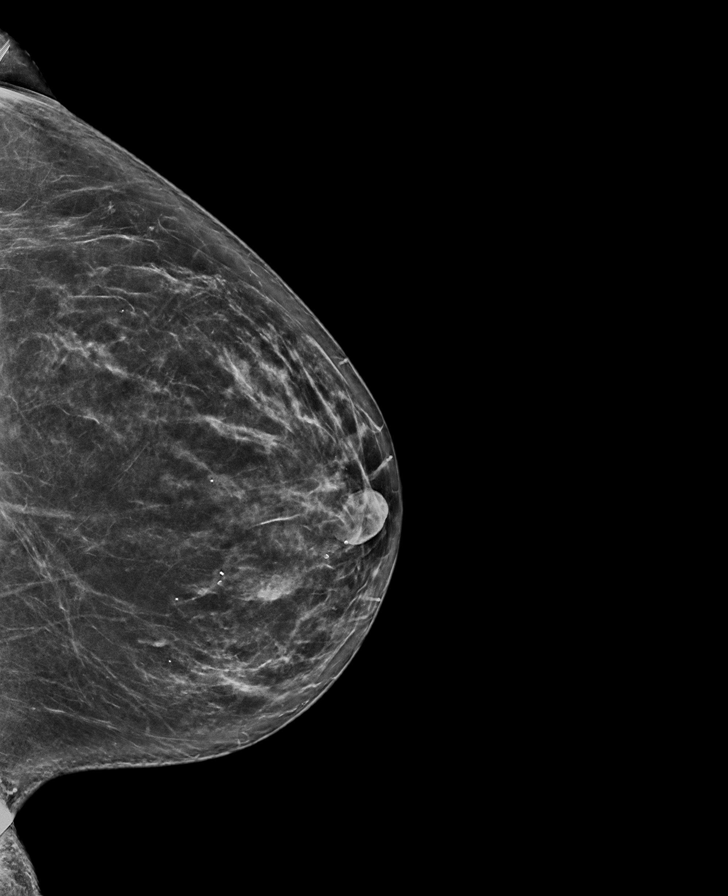

[R MLO synth-2D]
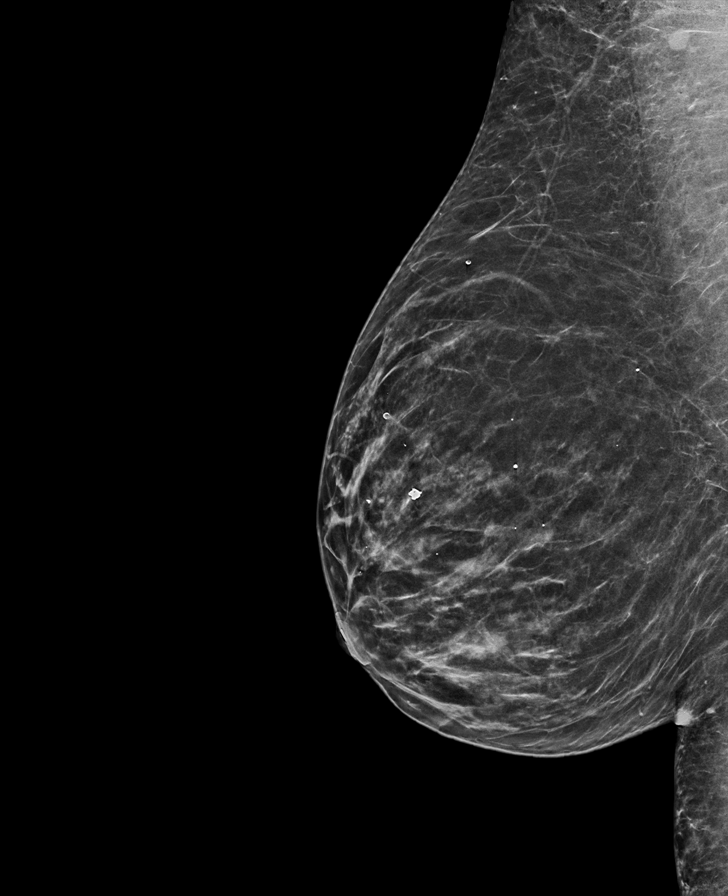

[L MLO synth-2D]
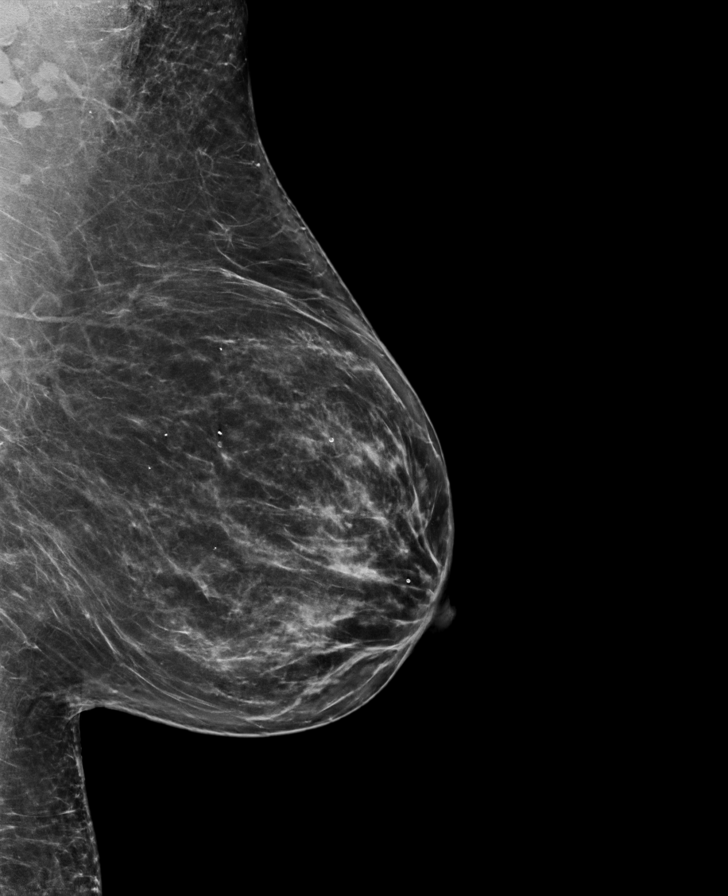

[R CC tomo · tomo slice 37/72.0]
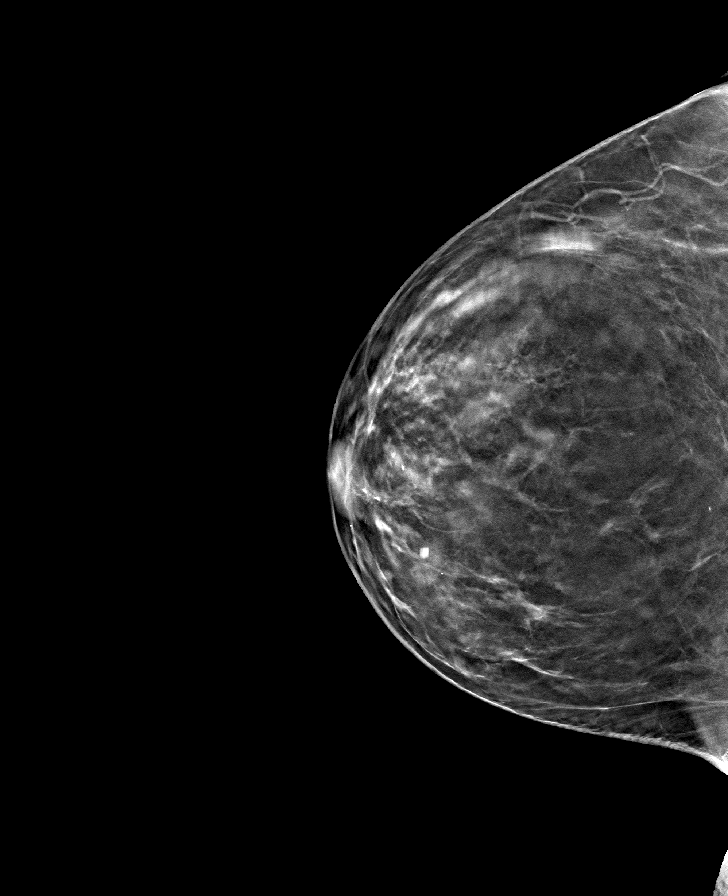

[L CC tomo · tomo slice 39/76.0]
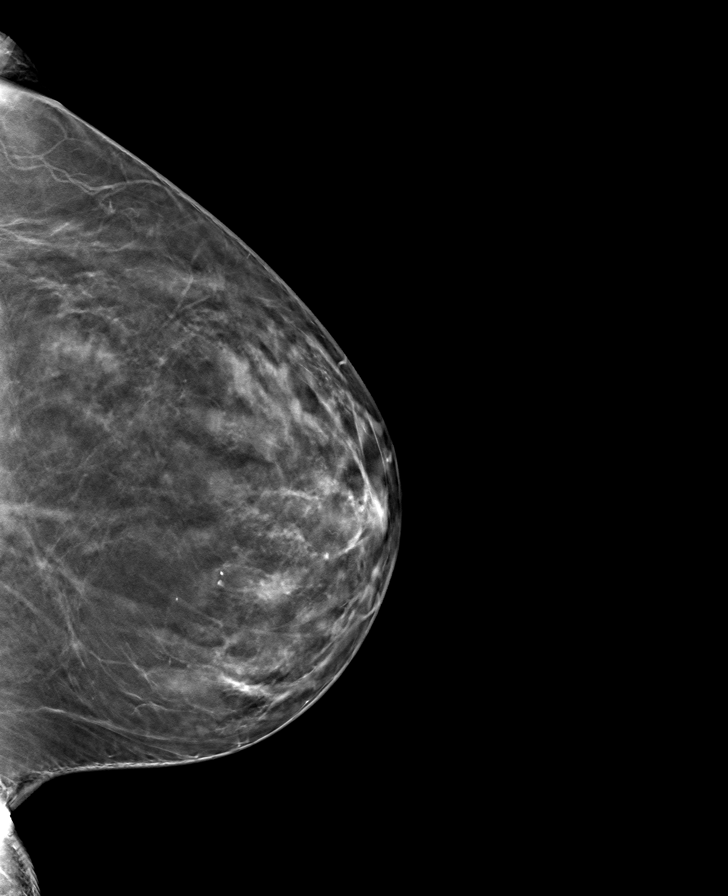

[R MLO tomo · tomo slice 32/63.0]
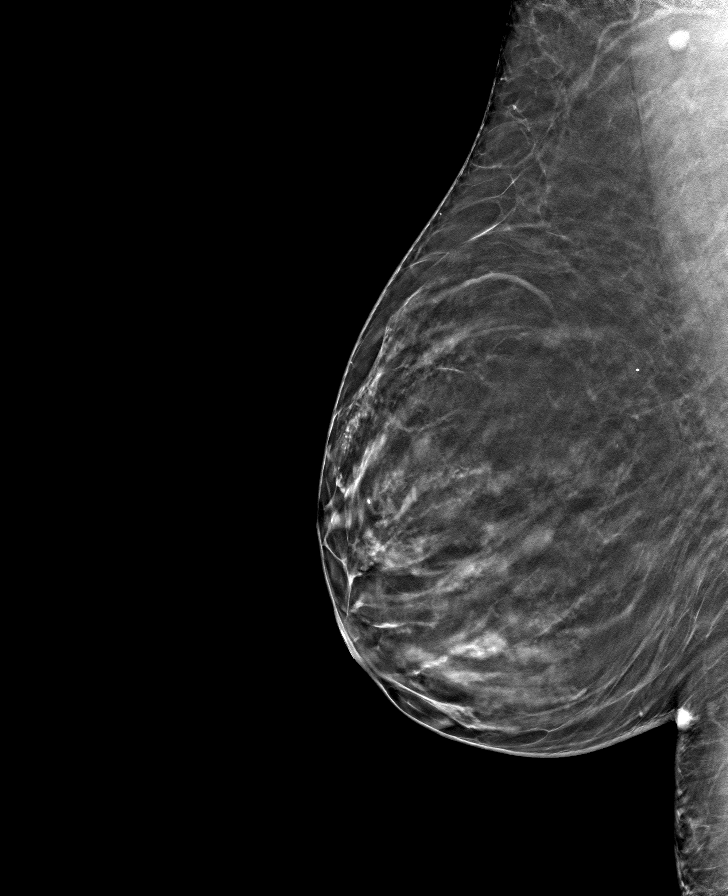

[L MLO tomo · tomo slice 41/82.0]
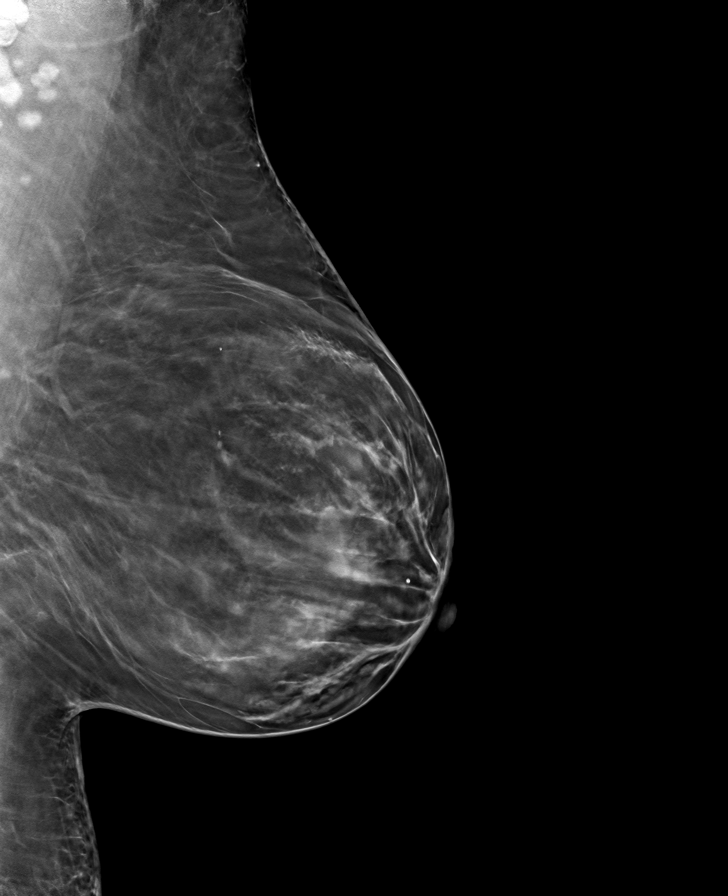

[8 of 24 positions shown; findings below may reference images not displayed]

ACR Breast Density Category b: There are scattered areas of
fibroglandular density.
FINDINGS: There are no findings suspicious for malignancy. Images were
processed with CAD.
IMPRESSION: No mammographic evidence of malignancy. A result letter of this
screening mammogram will be mailed directly to the patient.

RECOMMENDATION:
Screening mammogram in one year. (Code:[1T])

BI-RADS CATEGORY  1: Negative.

## 2020-03-26 NOTE — Patient Instructions (Signed)
Gave patient hand-out, Women Staying Healthy, Active and Well from [facility], with education on breast health, pap smears, heart and colon health. 

## 2020-03-26 NOTE — Progress Notes (Signed)
  Subjective:     Patient ID: Sheryl Silva, female   DOB: [birth year], 44 y.o.   MRN: [id number]  HPI   Review of Systems     Objective:   Physical Exam Chest:     Breasts:        Right: No swelling, bleeding, inverted nipple, mass, nipple discharge, skin change or tenderness.        Left: No swelling, bleeding, inverted nipple, mass, nipple discharge, skin change or tenderness.    Lymphadenopathy:     Upper Body:     Right upper body: No supraclavicular or axillary adenopathy.     Left upper body: No supraclavicular or axillary adenopathy.        Assessment:     44 year old Black female presents to [facility] for clinical breast exam and mammogram.  States she has been having irregular periods, bilateral breast tenderness, and hot flashes.  Discussed perimenopausal symptoms.  Clinical breast exam reveals bilateral thickening at the upper outer quadrants with slightly greater thickening on the left.  Taught self breast awareness.  Last pap smear on 05/19/17 was negative / negative.  Next pap due in 2023.  Patient has been screened for eligibility.  She does not have any insurance, Medicare or Medicaid.  She also meets financial eligibility.   Risk Assessment    Risk Scores      03/26/2020   Last edited by: Scarlett Presto, RN   5-year risk: 0.9 %   Lifetime risk: 9.3 %            Plan:     Screening mammogram ordered.  Will follow up per [facility] protocol.

## 2020-03-27 ENCOUNTER — Encounter: Payer: Self-pay | Admitting: *Deleted

## 2020-03-27 NOTE — Progress Notes (Signed)
Letter mailed from the [facility] to inform patient of her normal mammogram results.  Patient is to follow-up with annual screening in one year. 

## 2020-09-05 ENCOUNTER — Ambulatory Visit (LOCAL_COMMUNITY_HEALTH_CENTER): Payer: Medicaid Other

## 2020-09-05 ENCOUNTER — Other Ambulatory Visit: Payer: Self-pay

## 2020-09-05 DIAGNOSIS — Z111 Encounter for screening for respiratory tuberculosis: Secondary | ICD-10-CM

## 2020-09-08 ENCOUNTER — Other Ambulatory Visit: Payer: Self-pay

## 2020-09-08 ENCOUNTER — Ambulatory Visit (LOCAL_COMMUNITY_HEALTH_CENTER): Payer: Self-pay

## 2020-09-08 DIAGNOSIS — Z111 Encounter for screening for respiratory tuberculosis: Secondary | ICD-10-CM

## 2020-09-08 LAB — TB SKIN TEST
Induration: 0 mm
TB Skin Test: NEGATIVE

## 2020-10-19 ENCOUNTER — Emergency Department
Admission: EM | Admit: 2020-10-19 | Discharge: 2020-10-19 | Disposition: A | Discharge status: Home / Self Care | Payer: Medicaid Other | Attending: Emergency Medicine | Admitting: Emergency Medicine

## 2020-10-19 ENCOUNTER — Encounter: Payer: Self-pay | Admitting: Emergency Medicine

## 2020-10-19 ENCOUNTER — Other Ambulatory Visit: Payer: Self-pay

## 2020-10-19 DIAGNOSIS — F17210 Nicotine dependence, cigarettes, uncomplicated: Secondary | ICD-10-CM | POA: Insufficient documentation

## 2020-10-19 DIAGNOSIS — H6992 Unspecified Eustachian tube disorder, left ear: Secondary | ICD-10-CM | POA: Insufficient documentation

## 2020-10-19 DIAGNOSIS — Z79899 Other long term (current) drug therapy: Secondary | ICD-10-CM | POA: Insufficient documentation

## 2020-10-19 DIAGNOSIS — H6982 Other specified disorders of Eustachian tube, left ear: Secondary | ICD-10-CM

## 2020-10-19 DIAGNOSIS — H9202 Otalgia, left ear: Secondary | ICD-10-CM

## 2020-10-19 DIAGNOSIS — I10 Essential (primary) hypertension: Secondary | ICD-10-CM | POA: Insufficient documentation

## 2020-10-19 MED ORDER — KETOROLAC TROMETHAMINE 30 MG/ML IJ SOLN
30.0000 mg | Freq: Once | INTRAMUSCULAR | Status: AC
  Administered 2020-10-19: 30 mg via INTRAMUSCULAR

## 2020-10-19 MED ORDER — FLUTICASONE PROPIONATE 50 MCG/ACT NA SUSP: 1.0000 | Freq: Every day | NASAL | 2 refills | Status: DC

## 2020-10-19 MED ORDER — CETIRIZINE HCL 10 MG PO TABS: 10.0000 mg | ORAL_TABLET | Freq: Every day | ORAL | 0 refills | Status: DC

## 2020-10-19 NOTE — ED Provider Notes (Signed)
Emergency Department Provider Note  ____________________________________________  Time seen: Approximately 5:12 PM  I have reviewed the triage vital signs and the nursing notes.   HISTORY  Chief Complaint Ear Pain   Historian Patient     HPI Sheryl Silva is a 44 y.o. female presents to the emergency department with left ear pain for the past 2 weeks.  Patient states that it feels like she has consistent pressure and pain.  She has been on amoxicillin for the past 3 days.  She denies fever and chills.  No discharge from the left ear.  No changes in hearing.  No similar issues in the past.  No other alleviating measures have been attempted.   Past Medical History:  Diagnosis Date  . Hypertension      Immunizations up to date:  Yes.     Past Medical History:  Diagnosis Date  . Hypertension     There are no problems to display for this patient.   Past Surgical History:  Procedure Laterality Date  . CESAREAN SECTION      Prior to Admission medications   Medication Sig Start Date End Date Taking? Authorizing Provider  atenolol-chlorthalidone (TENORETIC) 50-25 MG per tablet Take 1 tablet by mouth daily.  01/23/14   [provider]  cetirizine (ZYRTEC ALLERGY) 10 MG tablet Take 1 tablet (10 mg total) by mouth daily. 10/19/20 10/19/21  Orvil Feil, PA-C  dicyclomine (BENTYL) 20 MG tablet Take 1 tablet (20 mg total) by mouth 3 (three) times daily as needed (abdominal pain). 08/12/17   Phineas Semen, MD  escitalopram (LEXAPRO) 10 MG tablet Take 1 tablet (10 mg total) by mouth daily. 03/25/17 03/25/18  Emily Filbert, MD  fluticasone (FLONASE) 50 MCG/ACT nasal spray Place 1 spray into both nostrils daily. 10/19/20 10/19/21  Orvil Feil, PA-C  ibuprofen (ADVIL,MOTRIN) 800 MG tablet Take 800 mg by mouth every 8 (eight) hours as needed for headache or cramping.  01/25/14   [provider]    Allergies Patient has no known allergies.  No  family history on file.  Social History Social History   Tobacco Use  . Smoking status: Current Every Day Smoker    Packs/day: 0.50    Types: Cigarettes  . Smokeless tobacco: Never Used  Substance Use Topics  . Alcohol use: Yes    Comment: occ  . Drug use: No     Review of Systems  Constitutional: No fever/chills Eyes:  No discharge ENT: Patient has left ear pain.  Respiratory: no cough. No SOB/ use of accessory muscles to breath Gastrointestinal:   No nausea, no vomiting.  No diarrhea.  No constipation. Musculoskeletal: Negative for musculoskeletal pain. Skin: Negative for rash, abrasions, lacerations, ecchymosis.   ____________________________________________   PHYSICAL EXAM:  VITAL SIGNS: ED Triage Vitals  Enc Vitals Group     BP 10/19/20 1519 (!) 142/84     Pulse Rate 10/19/20 1519 72     Resp 10/19/20 1519 18     Temp 10/19/20 1519 98.6 F (37 C)     Temp Source 10/19/20 1519 Oral     SpO2 10/19/20 1519 100 %     Weight 10/19/20 1504 185 lb (83.9 kg)     Height 10/19/20 1504 5\' 8"  (1.727 m)     Head Circumference --      Peak Flow --      Pain Score 10/19/20 1504 9     Pain Loc --      Pain  Edu? --      Excl. in GC? --      Constitutional: Alert and oriented. Well appearing and in no acute distress. Eyes: Conjunctivae are normal. PERRL. EOMI. Head: Atraumatic. ENT:      Ears: Patient's left TM is bulging and erythematous.  No tenderness with palpation of the left tragus.      Nose: No congestion/rhinnorhea.      Mouth/Throat: Mucous membranes are moist.  Neck: No stridor.  No cervical spine tenderness to palpation.  Cardiovascular: Normal rate, regular rhythm. Normal S1 and S2.  Good peripheral circulation. Respiratory: Normal respiratory effort without tachypnea or retractions. Lungs CTAB. Good air entry to the bases with no decreased or absent breath sounds Skin:  Skin is warm, dry and intact. No rash noted. Psychiatric: Mood and affect are  normal for age. Speech and behavior are normal.   ____________________________________________   LABS (all labs ordered are listed, but only abnormal results are displayed)  Labs Reviewed - No data to display ____________________________________________  EKG   ____________________________________________  RADIOLOGY   No results found.  ____________________________________________    PROCEDURES  Procedure(s) performed:     Procedures     Medications  ketorolac (TORADOL) 30 MG/ML injection 30 mg (30 mg Intramuscular Given 10/19/20 1710)     ____________________________________________   INITIAL IMPRESSION / ASSESSMENT AND PLAN / ED COURSE  Pertinent labs & imaging results that were available during my care of the patient were reviewed by me and considered in my medical decision making (see chart for details).      Assessment and plan Left ear pain 44 year old female presents to the emergency department with acute left ear pain.  On exam, left TM was erythematous and bulging.  Recommended patient continuing amoxicillin until completion.  Also prescribed patient Zyrtec and Flonase to address eustachian tube dysfunction.  All patient questions were answered.     ____________________________________________  FINAL CLINICAL IMPRESSION(S) / ED DIAGNOSES  Final diagnoses:  Left ear pain  Dysfunction of left eustachian tube      NEW MEDICATIONS STARTED DURING THIS VISIT:  ED Discharge Orders         Ordered    cetirizine (ZYRTEC ALLERGY) 10 MG tablet  Daily,   Status:  Discontinued        10/19/20 1629    fluticasone (FLONASE) 50 MCG/ACT nasal spray  Daily,   Status:  Discontinued        10/19/20 1629    cetirizine (ZYRTEC ALLERGY) 10 MG tablet  Daily        10/19/20 1701    fluticasone (FLONASE) 50 MCG/ACT nasal spray  Daily        10/19/20 1701              This chart was dictated using voice recognition software/Dragon. Despite best  efforts to proofread, errors can occur which can change the meaning. Any change was purely unintentional.     Orvil Feil, PA-C 10/19/20 1726    Sharyn Creamer, MD 10/20/20 1534

## 2020-10-19 NOTE — ED Triage Notes (Signed)
Pt reports pain to her left ear for 2 weeks. Pt took abx but still feels pressure

## 2020-10-19 NOTE — Discharge Instructions (Signed)
Take Zyrtec once daily before bed. Use 1 spray of Flonase on each side.

## 2022-02-17 ENCOUNTER — Other Ambulatory Visit: Payer: Self-pay | Admitting: Internal Medicine

## 2022-02-17 DIAGNOSIS — R69 Illness, unspecified: Secondary | ICD-10-CM | POA: Diagnosis not present

## 2022-02-17 DIAGNOSIS — E538 Deficiency of other specified B group vitamins: Secondary | ICD-10-CM | POA: Diagnosis not present

## 2022-02-17 DIAGNOSIS — F17210 Nicotine dependence, cigarettes, uncomplicated: Secondary | ICD-10-CM | POA: Diagnosis not present

## 2022-02-17 DIAGNOSIS — R7989 Other specified abnormal findings of blood chemistry: Secondary | ICD-10-CM | POA: Diagnosis not present

## 2022-02-17 DIAGNOSIS — Z1231 Encounter for screening mammogram for malignant neoplasm of breast: Secondary | ICD-10-CM

## 2022-02-17 DIAGNOSIS — Z124 Encounter for screening for malignant neoplasm of cervix: Secondary | ICD-10-CM | POA: Diagnosis not present

## 2022-02-17 DIAGNOSIS — M545 Low back pain, unspecified: Secondary | ICD-10-CM | POA: Diagnosis not present

## 2022-02-17 DIAGNOSIS — I10 Essential (primary) hypertension: Secondary | ICD-10-CM | POA: Diagnosis not present

## 2022-02-17 DIAGNOSIS — R7309 Other abnormal glucose: Secondary | ICD-10-CM | POA: Diagnosis not present

## 2022-02-17 DIAGNOSIS — Z0000 Encounter for general adult medical examination without abnormal findings: Secondary | ICD-10-CM | POA: Diagnosis not present

## 2022-02-17 DIAGNOSIS — F411 Generalized anxiety disorder: Secondary | ICD-10-CM | POA: Diagnosis not present

## 2022-03-15 DIAGNOSIS — G8929 Other chronic pain: Secondary | ICD-10-CM | POA: Diagnosis not present

## 2022-03-15 DIAGNOSIS — E876 Hypokalemia: Secondary | ICD-10-CM | POA: Diagnosis not present

## 2022-03-15 DIAGNOSIS — M545 Low back pain, unspecified: Secondary | ICD-10-CM | POA: Diagnosis not present

## 2022-03-23 ENCOUNTER — Ambulatory Visit
Admission: RE | Admit: 2022-03-23 | Discharge: 2022-03-23 | Disposition: A | Discharge status: Home / Self Care | Payer: 59 | Source: Ambulatory Visit | Attending: Internal Medicine | Admitting: Internal Medicine

## 2022-03-23 DIAGNOSIS — Z1231 Encounter for screening mammogram for malignant neoplasm of breast: Secondary | ICD-10-CM | POA: Insufficient documentation

## 2022-03-23 IMAGING — MG MM DIGITAL SCREENING BILAT W/ TOMO AND CAD
8 series · 8 of 24 positions shown · non-contrast
Comparison: Previous exam(s).

CLINICAL DATA: Screening.

EXAM:
DIGITAL SCREENING BILATERAL MAMMOGRAM WITH TOMOSYNTHESIS AND CAD
TECHNIQUE: Bilateral screening digital craniocaudal and mediolateral oblique
mammograms were obtained. Bilateral screening digital breast
tomosynthesis was performed. The images were evaluated with
computer-aided detection.

[R CC synth-2D]
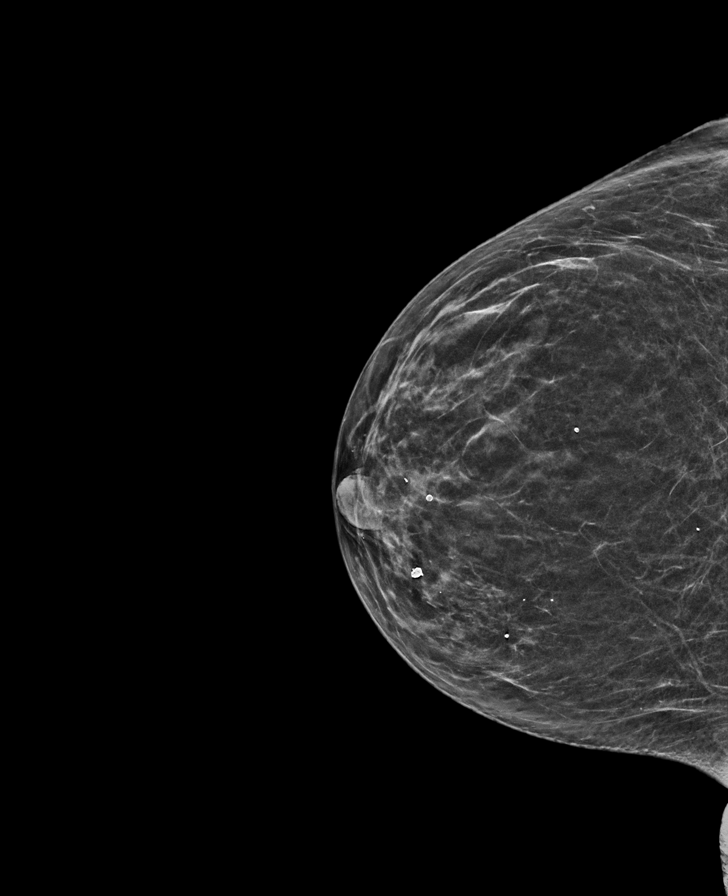

[L MLO synth-2D]
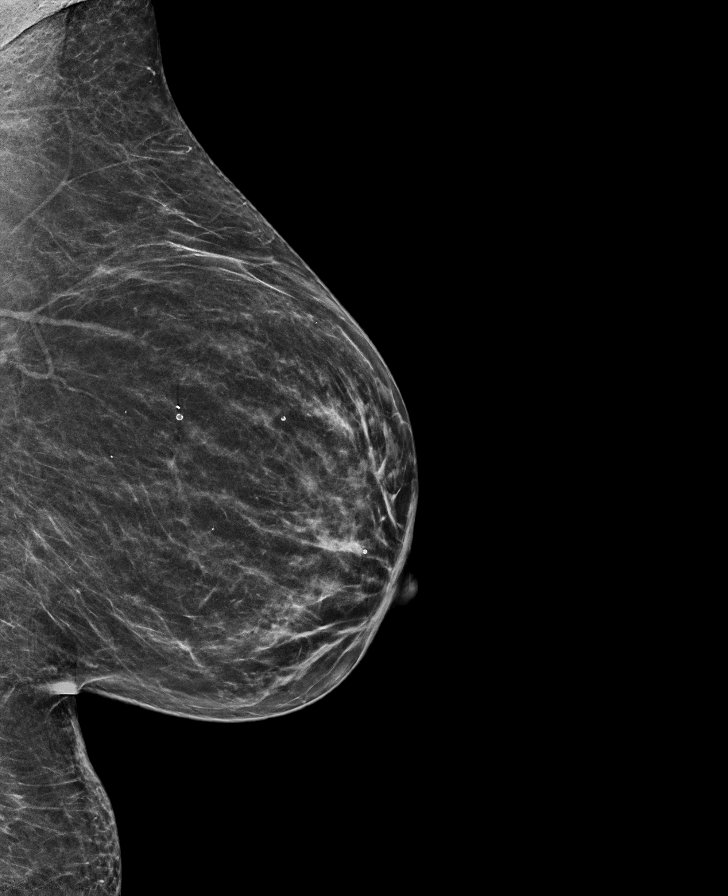

[L CC synth-2D]
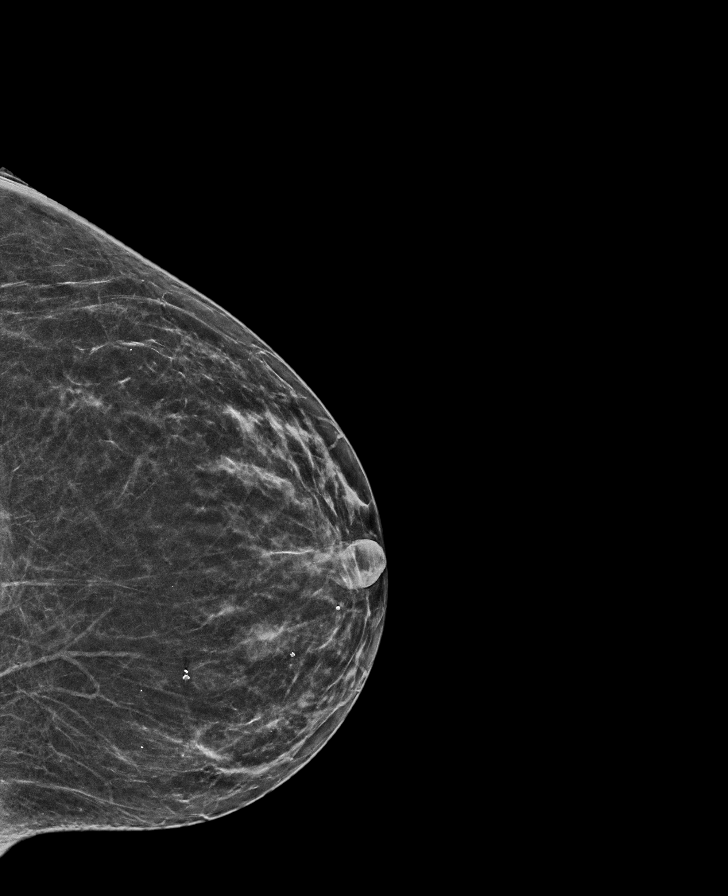

[R MLO synth-2D]
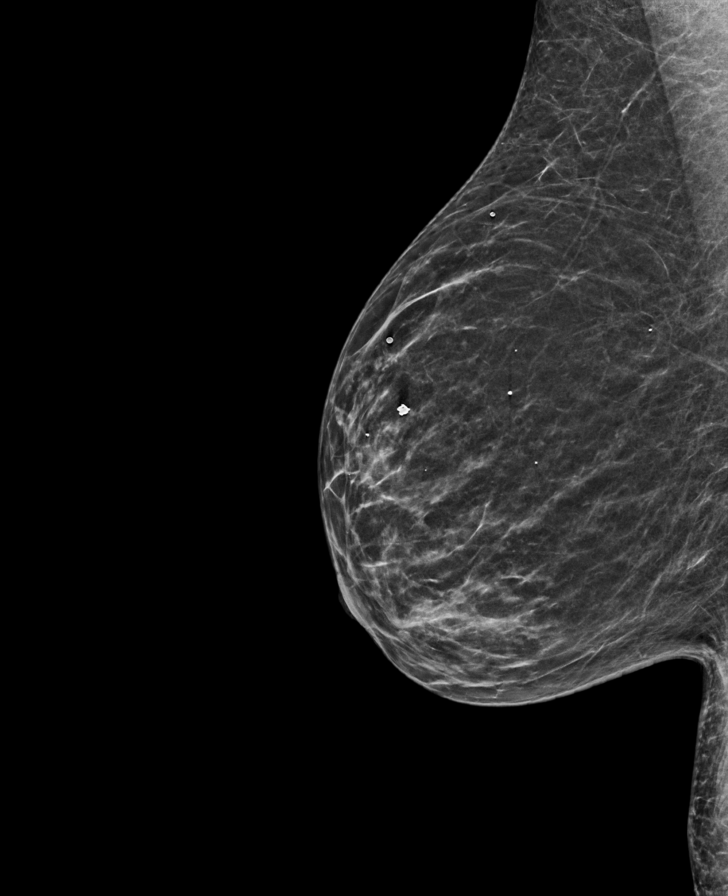

[L MLO tomo · tomo slice 29/57.0]
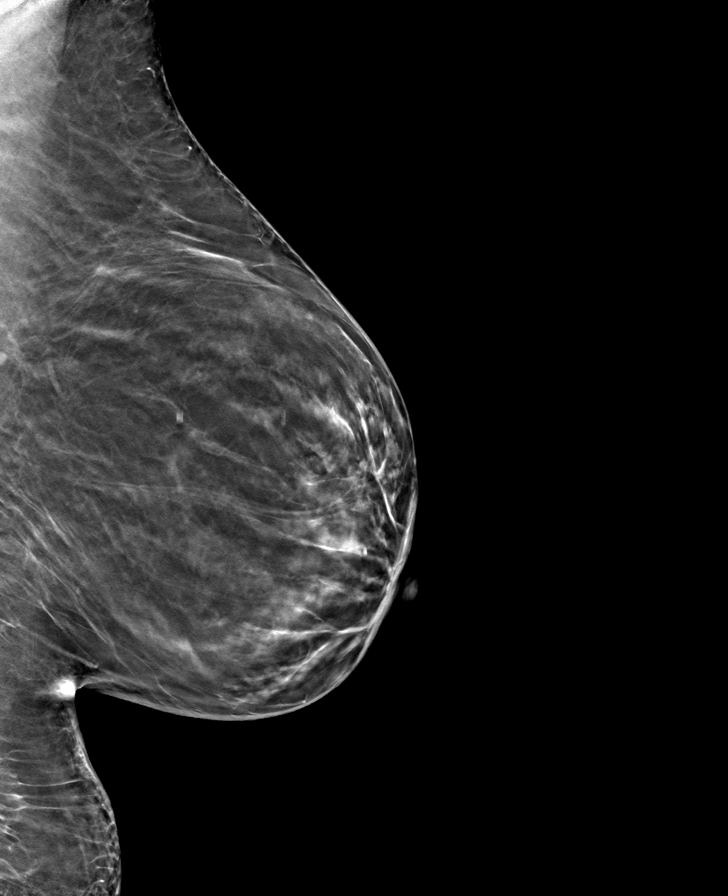

[L CC tomo · tomo slice 28/55.0]
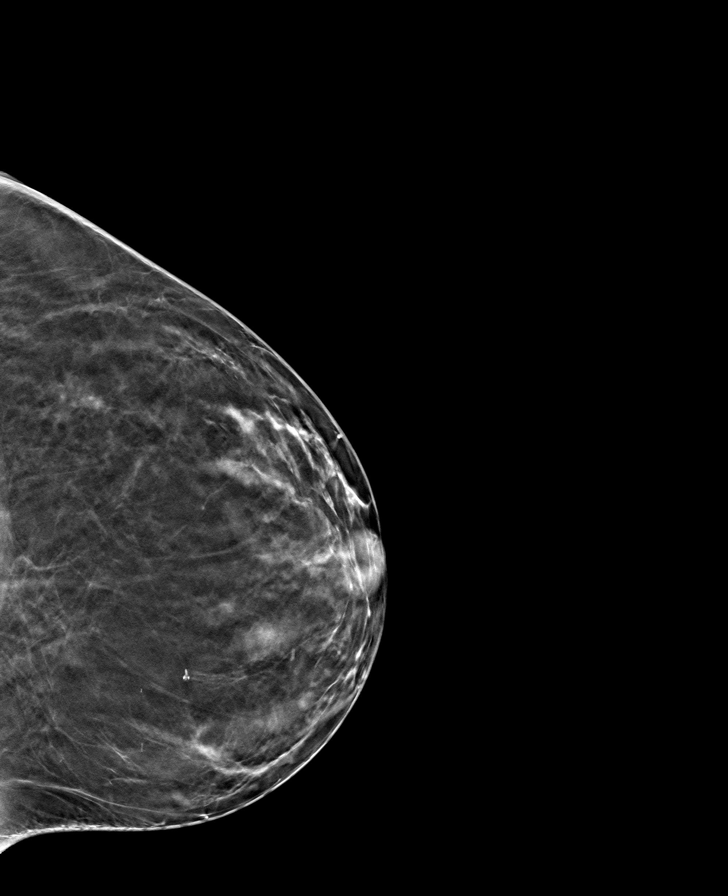

[R CC tomo · tomo slice 27/52.0]
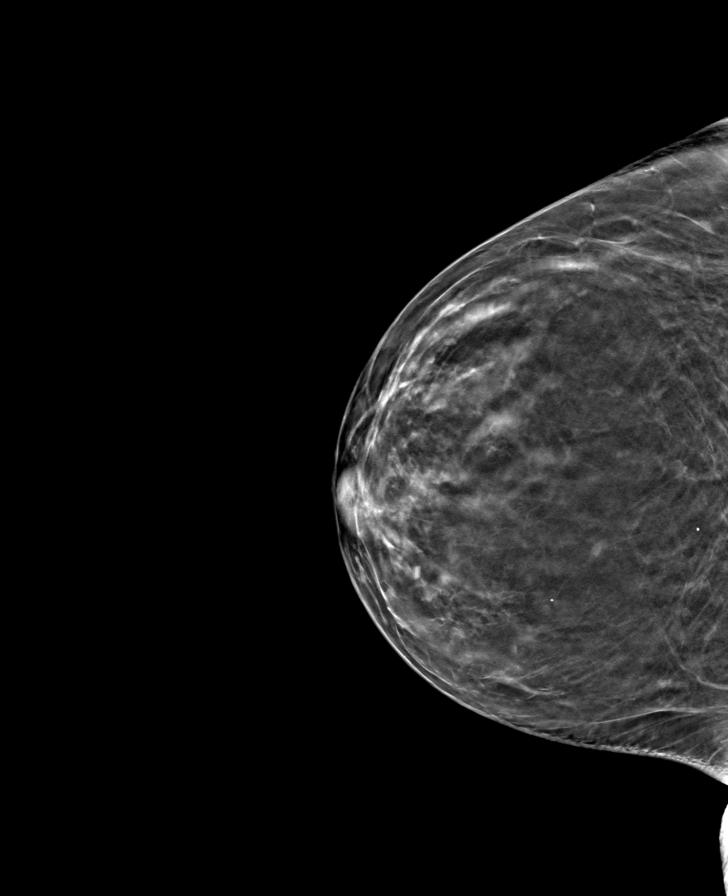

[R MLO tomo · tomo slice 27/52.0]
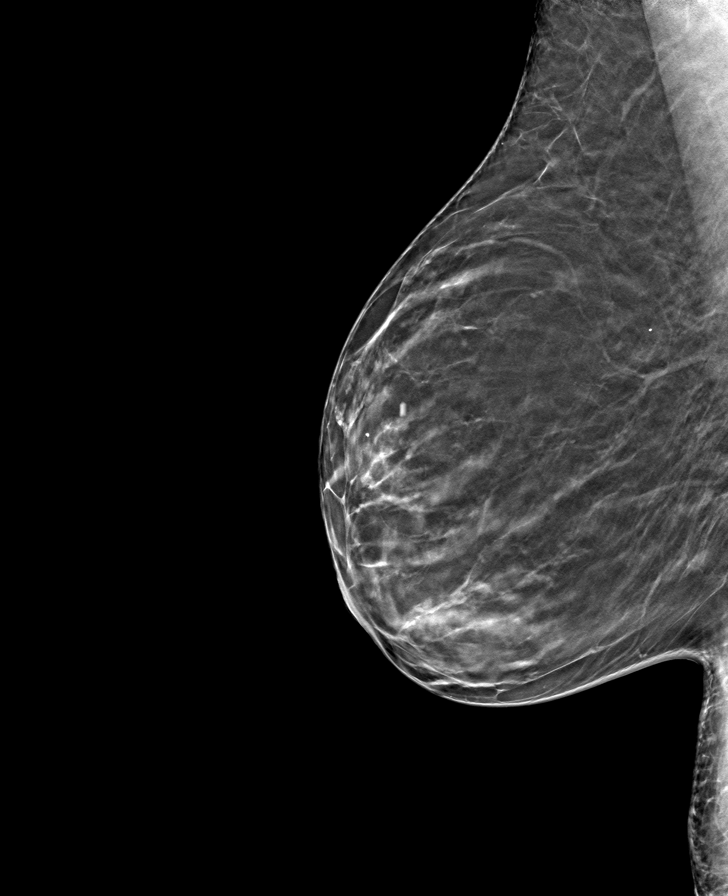

[8 of 24 positions shown; findings below may reference images not displayed]

ACR Breast Density Category b: There are scattered areas of
fibroglandular density.
FINDINGS: There are no findings suspicious for malignancy.
IMPRESSION: No mammographic evidence of malignancy. A result letter of this
screening mammogram will be mailed directly to the patient.

RECOMMENDATION:
Screening mammogram in one year. (Code:[BY])

BI-RADS CATEGORY  1: Negative.

## 2022-03-25 DIAGNOSIS — R29898 Other symptoms and signs involving the musculoskeletal system: Secondary | ICD-10-CM | POA: Diagnosis not present

## 2022-03-25 DIAGNOSIS — R42 Dizziness and giddiness: Secondary | ICD-10-CM | POA: Diagnosis not present

## 2022-03-25 DIAGNOSIS — G8929 Other chronic pain: Secondary | ICD-10-CM | POA: Diagnosis not present

## 2022-03-25 DIAGNOSIS — Z720 Tobacco use: Secondary | ICD-10-CM | POA: Diagnosis not present

## 2022-03-25 DIAGNOSIS — M5441 Lumbago with sciatica, right side: Secondary | ICD-10-CM | POA: Diagnosis not present

## 2022-03-25 DIAGNOSIS — M5442 Lumbago with sciatica, left side: Secondary | ICD-10-CM | POA: Diagnosis not present

## 2022-03-25 DIAGNOSIS — E876 Hypokalemia: Secondary | ICD-10-CM | POA: Diagnosis not present

## 2022-03-25 DIAGNOSIS — E538 Deficiency of other specified B group vitamins: Secondary | ICD-10-CM | POA: Diagnosis not present

## 2022-04-04 DIAGNOSIS — M4802 Spinal stenosis, cervical region: Secondary | ICD-10-CM | POA: Diagnosis not present

## 2022-04-04 DIAGNOSIS — R208 Other disturbances of skin sensation: Secondary | ICD-10-CM | POA: Diagnosis not present

## 2022-04-04 DIAGNOSIS — R200 Anesthesia of skin: Secondary | ICD-10-CM | POA: Diagnosis not present

## 2022-04-04 DIAGNOSIS — M50221 Other cervical disc displacement at C4-C5 level: Secondary | ICD-10-CM | POA: Diagnosis not present

## 2022-04-04 DIAGNOSIS — E876 Hypokalemia: Secondary | ICD-10-CM | POA: Diagnosis not present

## 2022-04-04 DIAGNOSIS — M50021 Cervical disc disorder at C4-C5 level with myelopathy: Secondary | ICD-10-CM | POA: Diagnosis not present

## 2022-04-04 DIAGNOSIS — M5442 Lumbago with sciatica, left side: Secondary | ICD-10-CM | POA: Diagnosis not present

## 2022-04-04 DIAGNOSIS — Z9289 Personal history of other medical treatment: Secondary | ICD-10-CM | POA: Diagnosis not present

## 2022-04-06 DIAGNOSIS — M4802 Spinal stenosis, cervical region: Secondary | ICD-10-CM | POA: Insufficient documentation

## 2022-04-09 DIAGNOSIS — Z09 Encounter for follow-up examination after completed treatment for conditions other than malignant neoplasm: Secondary | ICD-10-CM | POA: Diagnosis not present

## 2022-04-09 DIAGNOSIS — R519 Headache, unspecified: Secondary | ICD-10-CM | POA: Diagnosis not present

## 2022-04-09 DIAGNOSIS — R29810 Facial weakness: Secondary | ICD-10-CM | POA: Diagnosis not present

## 2022-04-09 DIAGNOSIS — M542 Cervicalgia: Secondary | ICD-10-CM | POA: Diagnosis not present

## 2022-04-09 DIAGNOSIS — R531 Weakness: Secondary | ICD-10-CM | POA: Diagnosis not present

## 2022-04-09 DIAGNOSIS — R202 Paresthesia of skin: Secondary | ICD-10-CM | POA: Diagnosis not present

## 2022-04-09 DIAGNOSIS — M5442 Lumbago with sciatica, left side: Secondary | ICD-10-CM | POA: Diagnosis not present

## 2022-04-12 ENCOUNTER — Other Ambulatory Visit: Payer: Self-pay | Admitting: Physician Assistant

## 2022-04-12 DIAGNOSIS — M5442 Lumbago with sciatica, left side: Secondary | ICD-10-CM

## 2022-04-12 DIAGNOSIS — R519 Headache, unspecified: Secondary | ICD-10-CM

## 2022-04-12 DIAGNOSIS — R531 Weakness: Secondary | ICD-10-CM

## 2022-04-12 DIAGNOSIS — R29810 Facial weakness: Secondary | ICD-10-CM

## 2022-04-13 ENCOUNTER — Encounter: Payer: Self-pay | Admitting: Radiology

## 2022-04-13 ENCOUNTER — Ambulatory Visit
Admission: RE | Admit: 2022-04-13 | Discharge: 2022-04-13 | Disposition: A | Discharge status: Home / Self Care | Payer: 59 | Source: Ambulatory Visit | Attending: Physician Assistant | Admitting: Physician Assistant

## 2022-04-13 DIAGNOSIS — R29898 Other symptoms and signs involving the musculoskeletal system: Secondary | ICD-10-CM | POA: Diagnosis not present

## 2022-04-13 DIAGNOSIS — R531 Weakness: Secondary | ICD-10-CM | POA: Diagnosis not present

## 2022-04-13 DIAGNOSIS — R519 Headache, unspecified: Secondary | ICD-10-CM | POA: Insufficient documentation

## 2022-04-13 DIAGNOSIS — M5442 Lumbago with sciatica, left side: Secondary | ICD-10-CM | POA: Diagnosis not present

## 2022-04-13 DIAGNOSIS — R29810 Facial weakness: Secondary | ICD-10-CM | POA: Insufficient documentation

## 2022-04-13 IMAGING — MR MR HEAD WO/W CM
15 series · 48 of 48 positions shown · IV contrast (gadavist)
Comparison: None Available.

CLINICAL DATA: Left leg weakness.

EXAM:
MRI HEAD WITHOUT AND WITH CONTRAST
TECHNIQUE: Multiplanar, multiecho pulse sequences of the brain and surrounding
structures were obtained without and with intravenous contrast.
CONTRAST:  7.5mL GADAVIST GADOBUTROL 1 MMOL/ML IV SOLN

[Series 5: ax dwi_tracew · axial · 3.0mm · 0.71mm/px · z∈[-91,+74]mm · 4 of 56 slices shown]
[im 1/56]
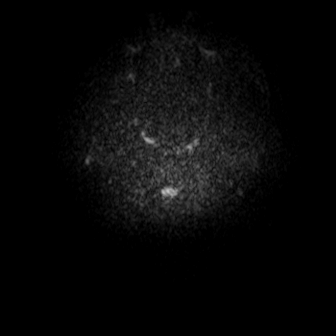
[im 19/56]
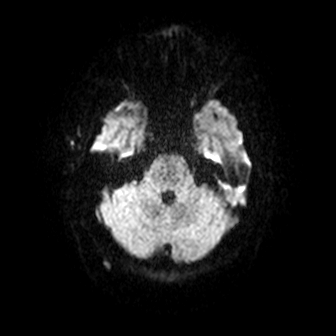
[im 37/56]
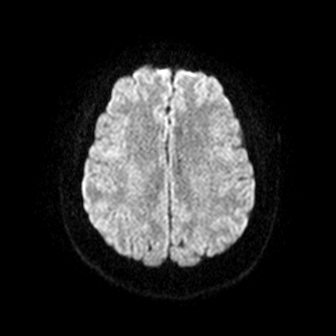
[im 56/56]
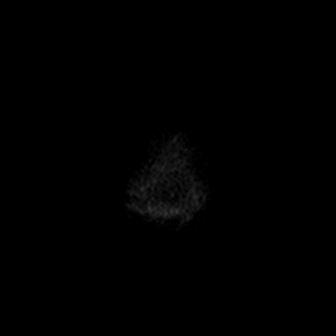

[Series 6: ax dwi_adc · axial · 3.0mm · 0.71mm/px · z∈[-91,+74]mm · 3 of 56 slices shown]
[im 1/56]
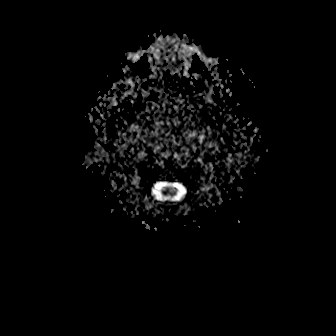
[im 28/56]
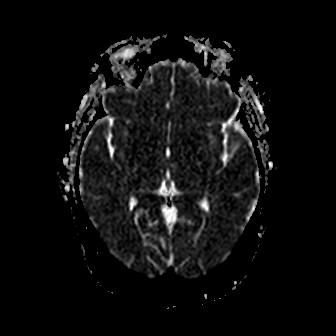
[im 56/56]
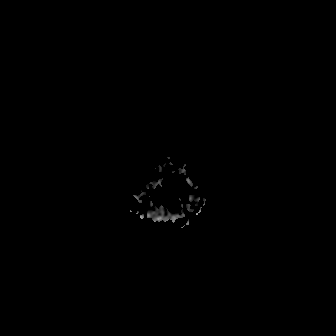

[Series 7: cor dwi_tracew · coronal · 5.0mm · 0.68mm/px · 2 of 40 slices shown]
[im 1/40]
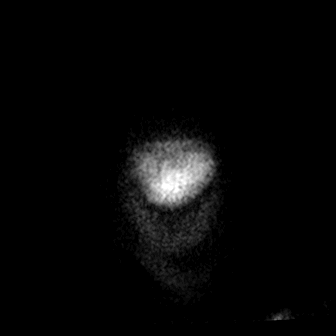
[im 40/40]
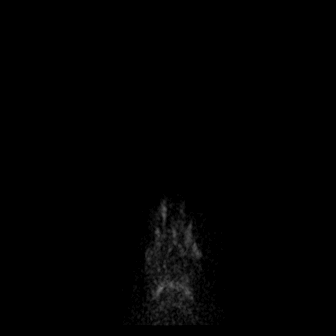

[Series 8: cor dwi_adc · coronal · 5.0mm · 0.68mm/px · 2 of 40 slices shown]
[im 1/40]
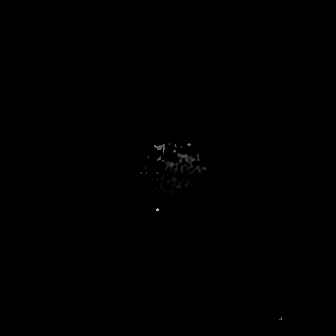
[im 40/40]
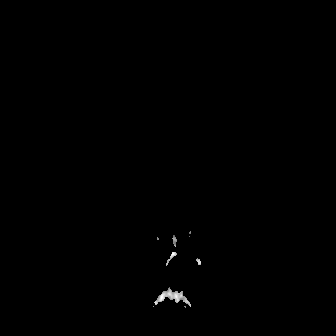

[Series 9: T1 · sagittal · 5.0mm · 0.47mm/px · 1 of 24 slices shown (1 of 2)]
[im 1/24]
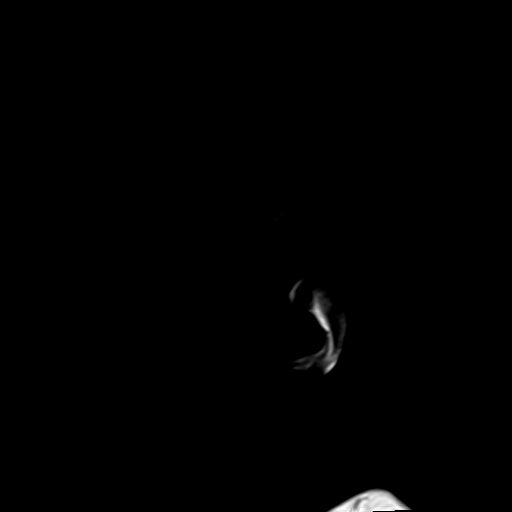

[Series 10: T2 · axial · 5.0mm · 0.86mm/px · 1 of 25 slices shown (1 of 2)]
[im 1/25]
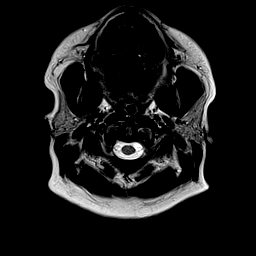

[Series 11: ax swi_mag · axial · 3.0mm · 0.90mm/px · z∈[-92,+72]mm · 3 of 56 slices shown]
[im 1/56]
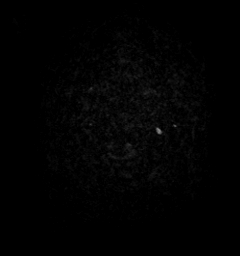
[im 28/56]
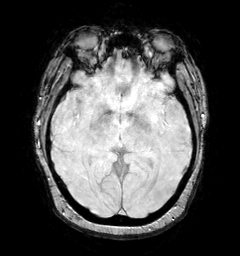
[im 56/56]
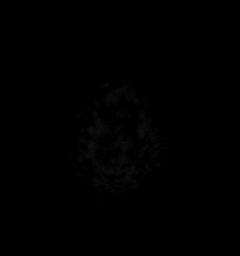

[Series 12: ax swi_pha · axial · 3.0mm · 0.90mm/px · z∈[-92,+72]mm · 3 of 56 slices shown]
[im 1/56]
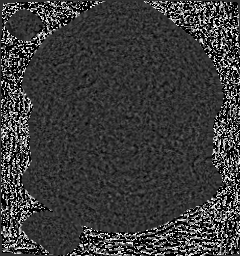
[im 28/56]
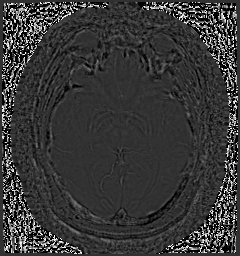
[im 56/56]
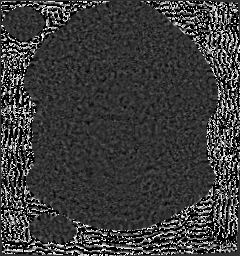

[Series 13: ax swi_swi · axial · 3.0mm · 0.90mm/px · z∈[-92,+72]mm · 3 of 56 slices shown]
[im 1/56]
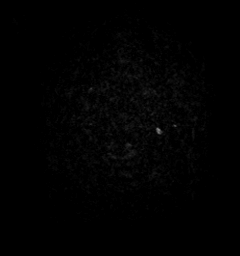
[im 28/56]
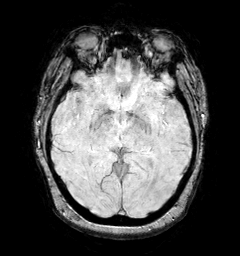
[im 56/56]
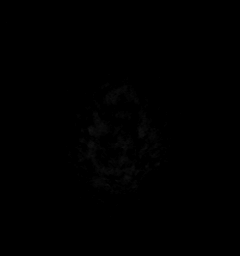

[Series 15: FLAIR · axial · 3.0mm · 0.69mm/px · z∈[-91,+71]mm · 3 of 55 slices shown]
[im 1/55]
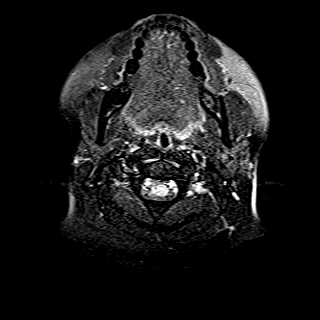
[im 28/55]
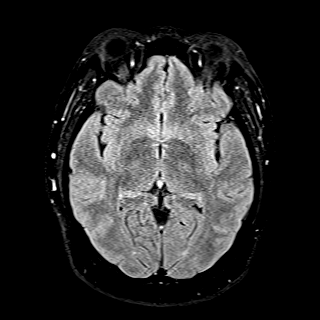
[im 55/55]
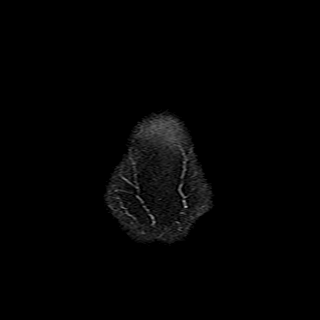

[Series 16: T1 · axial · 1.0mm · 0.98mm/px · z∈[-89,+85]mm · 9 of 175 slices shown (2 of 2)]
[im 1/175]
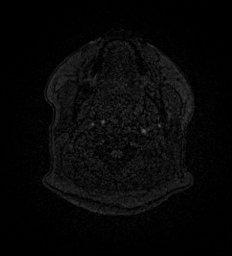
[im 22/175]
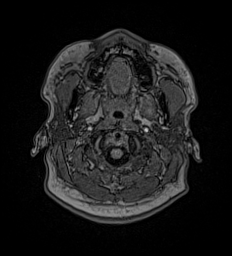
[im 44/175]
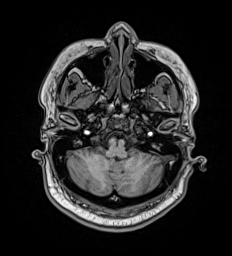
[im 66/175]
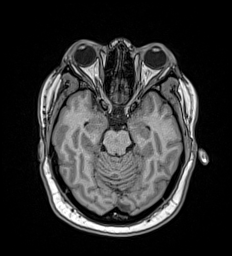
[im 88/175]
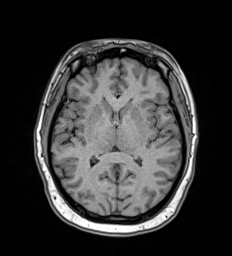
[im 109/175]
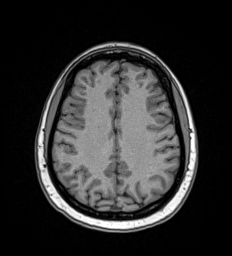
[im 131/175]
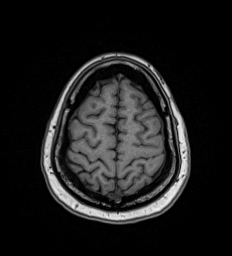
[im 153/175]
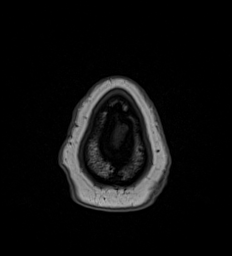
[im 175/175]
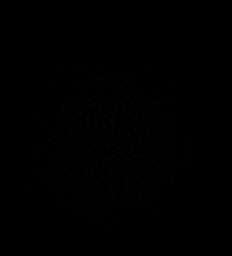

[Series 17: T2 · coronal · 5.0mm · 0.86mm/px · 2 of 30 slices shown (2 of 2)]
[im 1/30]
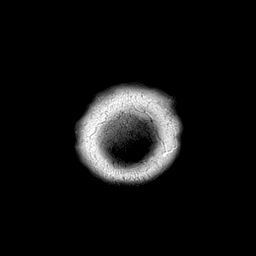
[im 30/30]
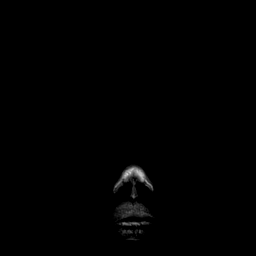

[Series 18: T1 post-contrast · axial · 1.0mm · 0.98mm/px · z∈[-89,+85]mm · 9 of 175 slices shown (1 of 3)]
[im 1/175]
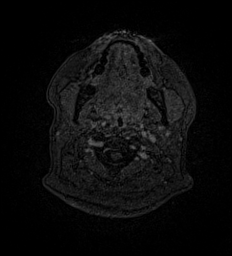
[im 22/175]
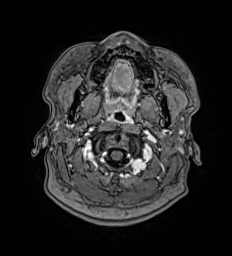
[im 44/175]
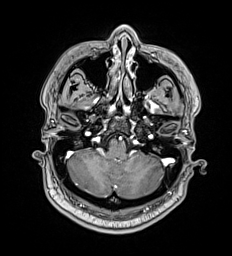
[im 66/175]
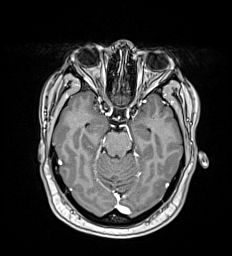
[im 88/175]
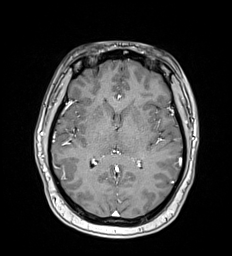
[im 109/175]
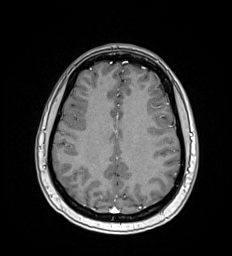
[im 131/175]
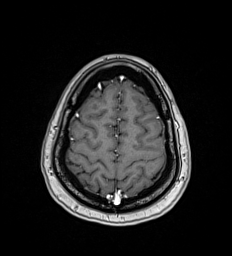
[im 153/175]
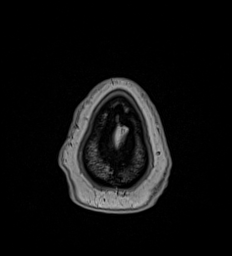
[im 175/175]
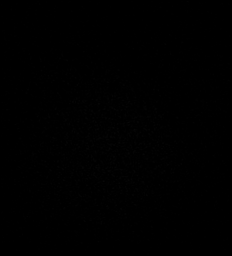

[Series 19: T1 post-contrast · coronal · 5.0mm · 0.43mm/px · 2 of 30 slices shown (2 of 3)]
[im 1/30]
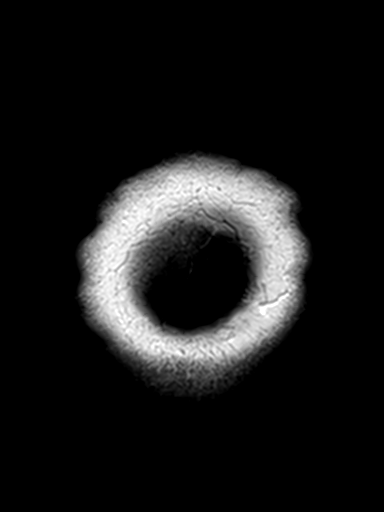
[im 30/30]
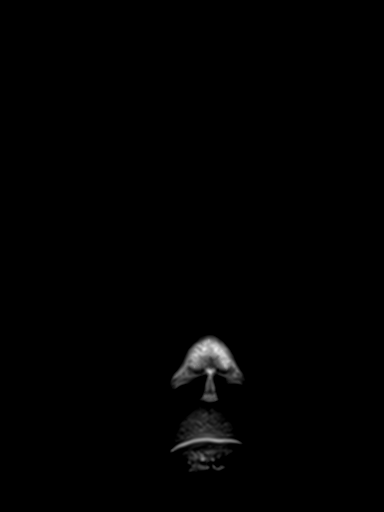

[Series 20: T1 post-contrast · sagittal · 5.0mm · 0.47mm/px · 1 of 24 slices shown (3 of 3)]
[im 1/24]
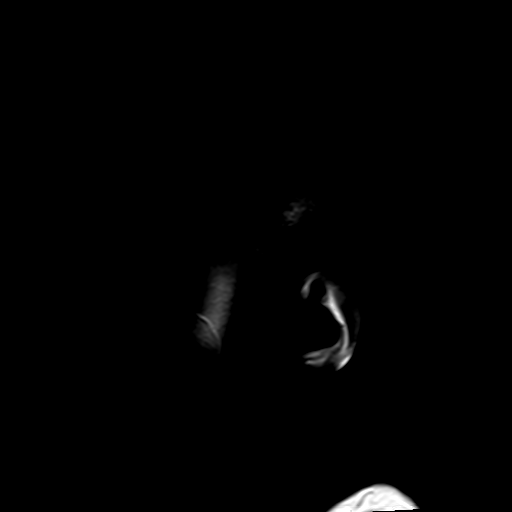

[48 of 48 positions shown; findings below may reference images not displayed]

FINDINGS: Brain: No acute infarction, hemorrhage, hydrocephalus, extra-axial
collection or mass lesion. No pathologic enhancement. A few small
T2/FLAIR hyperintensities in the white matter, nonspecific but
considered within normal limits for patient age.

Vascular: Major arterial flow voids are maintained at the skull
base.

Skull and upper cervical spine: Normal marrow signal.

Sinuses/Orbits: Clear sinuses.  No acute orbital findings.

Other: No mastoid effusions.
IMPRESSION: Normal brain MRI.  No evidence of acute intracranial abnormality.

## 2022-04-13 MED ORDER — GADOBUTROL 1 MMOL/ML IV SOLN
7.5000 mL | Freq: Once | INTRAVENOUS | Status: AC | PRN
  Administered 2022-04-13: 7.5 mL via INTRAVENOUS

## 2022-04-14 DIAGNOSIS — M5126 Other intervertebral disc displacement, lumbar region: Secondary | ICD-10-CM | POA: Diagnosis not present

## 2022-04-14 DIAGNOSIS — R262 Difficulty in walking, not elsewhere classified: Secondary | ICD-10-CM | POA: Diagnosis not present

## 2022-04-14 DIAGNOSIS — E7800 Pure hypercholesterolemia, unspecified: Secondary | ICD-10-CM | POA: Diagnosis not present

## 2022-04-14 DIAGNOSIS — M5416 Radiculopathy, lumbar region: Secondary | ICD-10-CM | POA: Diagnosis not present

## 2022-04-15 ENCOUNTER — Other Ambulatory Visit: Payer: Self-pay | Admitting: Family Medicine

## 2022-04-15 DIAGNOSIS — M5412 Radiculopathy, cervical region: Secondary | ICD-10-CM | POA: Diagnosis not present

## 2022-04-15 DIAGNOSIS — M5030 Other cervical disc degeneration, unspecified cervical region: Secondary | ICD-10-CM | POA: Diagnosis not present

## 2022-04-15 DIAGNOSIS — M5136 Other intervertebral disc degeneration, lumbar region: Secondary | ICD-10-CM | POA: Diagnosis not present

## 2022-04-15 DIAGNOSIS — M5416 Radiculopathy, lumbar region: Secondary | ICD-10-CM | POA: Diagnosis not present

## 2022-04-17 ENCOUNTER — Ambulatory Visit
Admission: RE | Admit: 2022-04-17 | Discharge: 2022-04-17 | Disposition: A | Discharge status: Home / Self Care | Payer: 59 | Source: Ambulatory Visit | Attending: Family Medicine | Admitting: Family Medicine

## 2022-04-17 DIAGNOSIS — M5416 Radiculopathy, lumbar region: Secondary | ICD-10-CM | POA: Insufficient documentation

## 2022-04-17 DIAGNOSIS — M545 Low back pain, unspecified: Secondary | ICD-10-CM | POA: Diagnosis not present

## 2022-04-17 DIAGNOSIS — R200 Anesthesia of skin: Secondary | ICD-10-CM | POA: Diagnosis not present

## 2022-04-17 IMAGING — MR MR LUMBAR SPINE W/O CM
5 series · 31 of 48 positions shown · non-contrast
Comparison: Report of lumbar spine radiographs [DATE] at
[REDACTED].

CLINICAL DATA: Low back pain for 1 month. Pain extends into the
left lower extremity. Left lower extremity numbness and weakness.

EXAM:
MRI LUMBAR SPINE WITHOUT CONTRAST
TECHNIQUE: Multiplanar, multisequence MR imaging of the lumbar spine was
performed. No intravenous contrast was administered.

[Series 5: T2 · sagittal · 4.0mm · 0.81mm/px · 6 of 17 slices shown (1 of 2)]
[im 1/17]
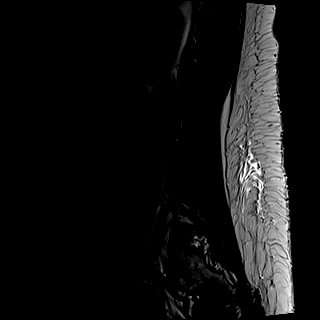
[im 4/17]
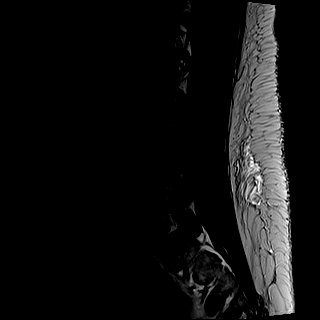
[im 7/17]
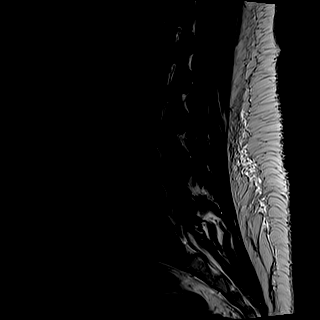
[im 10/17]
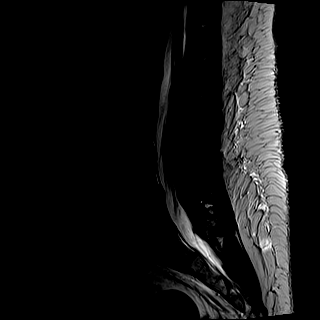
[im 13/17]
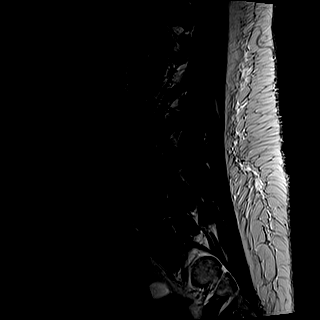
[im 17/17]
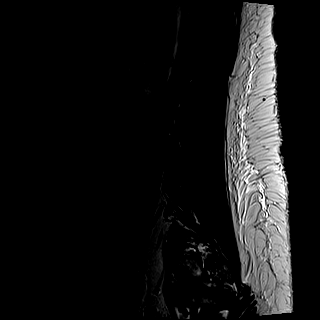

[Series 6: T1 · sagittal · 4.0mm · 0.81mm/px · 7 of 17 slices shown (1 of 2)]
[im 1/17]
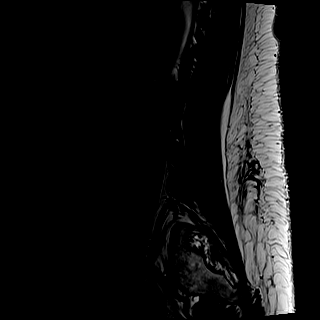
[im 3/17]
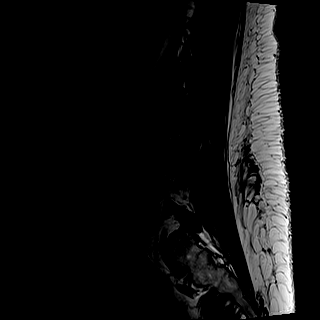
[im 6/17]
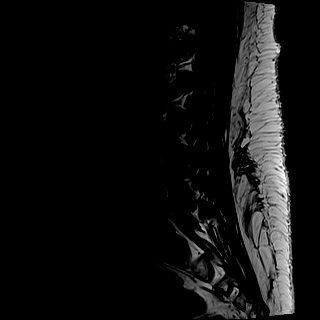
[im 9/17]
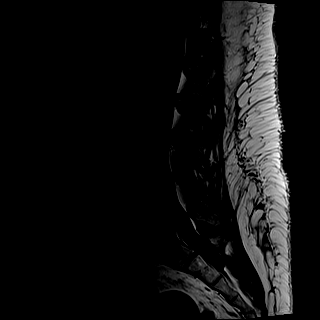
[im 11/17]
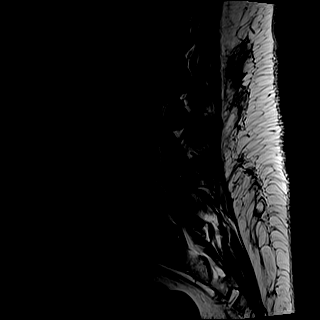
[im 14/17]
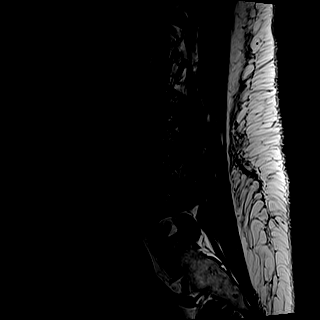
[im 17/17]
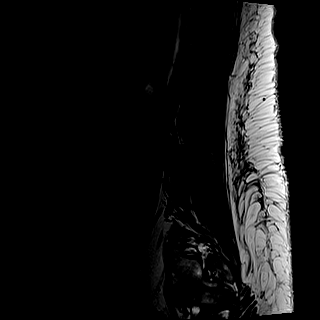

[Series 7: STIR · sagittal · 4.0mm · 0.41mm/px · 2 of 17 slices shown]
[im 1/17]
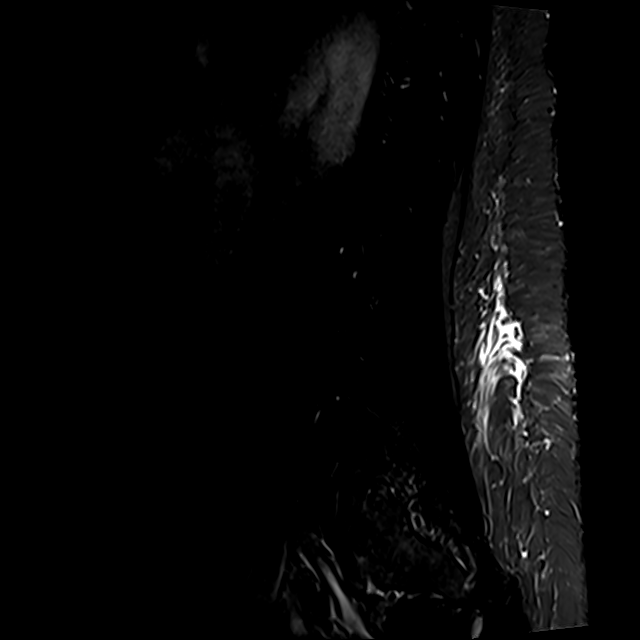
[im 3/17]
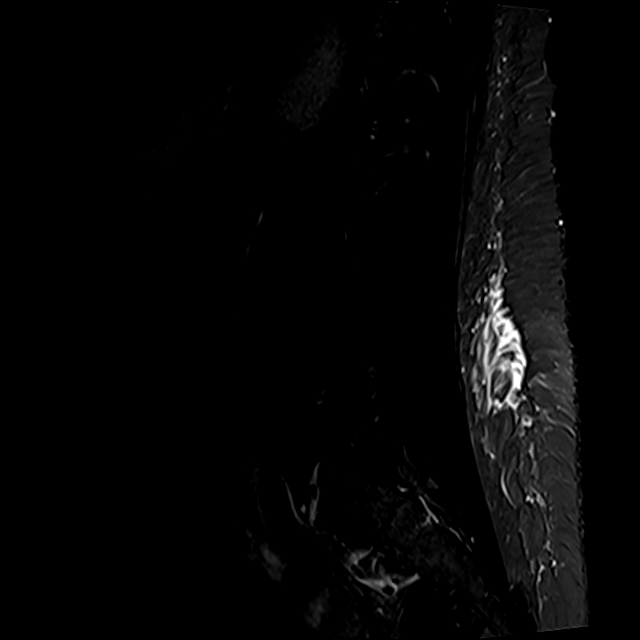

[Series 8: T2 · axial · 4.0mm · 0.78mm/px · z∈[-31,+176]mm · 8 of 35 slices shown (2 of 2)]
[im 1/35]
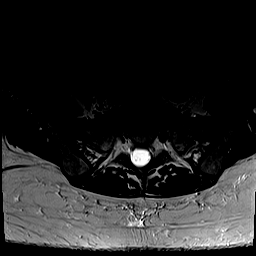
[im 6/35]
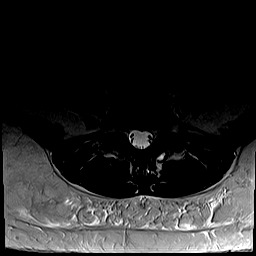
[im 11/35]
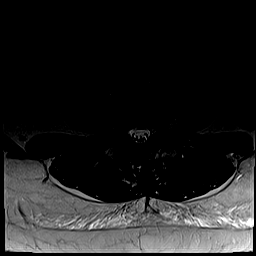
[im 16/35]
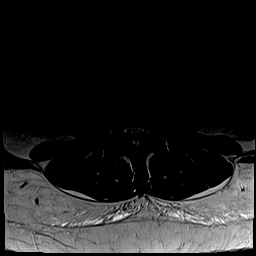
[im 19/35]
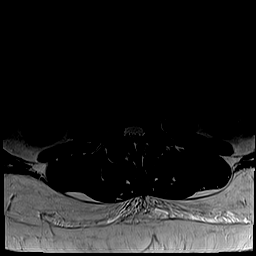
[im 24/35]
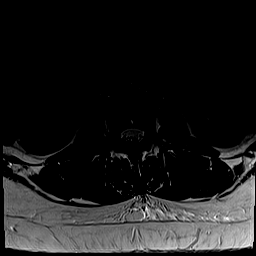
[im 29/35]
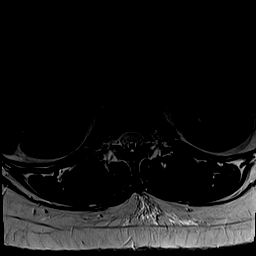
[im 35/35]
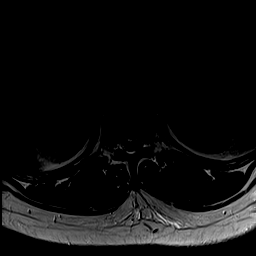

[Series 9: T1 · axial · 4.0mm · 0.39mm/px · z∈[-31,+176]mm · 8 of 35 slices shown (2 of 2)]
[im 1/35]
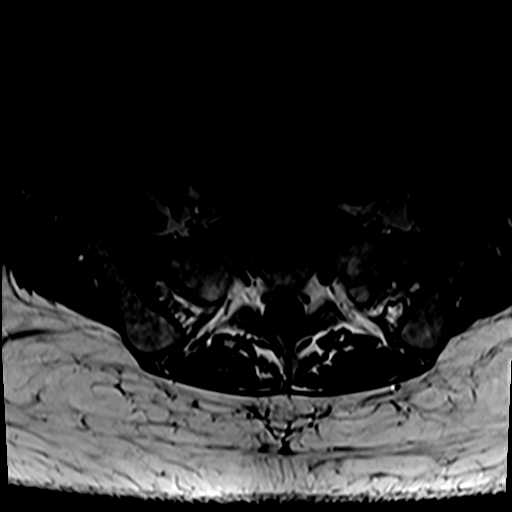
[im 6/35]
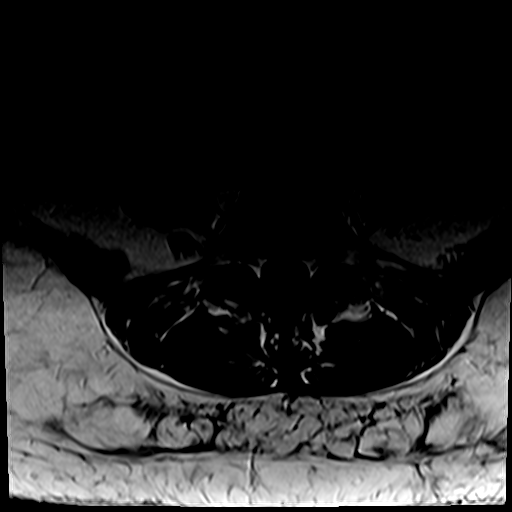
[im 11/35]
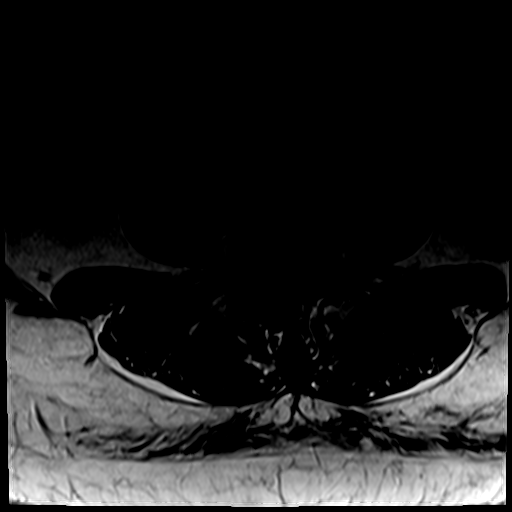
[im 16/35]
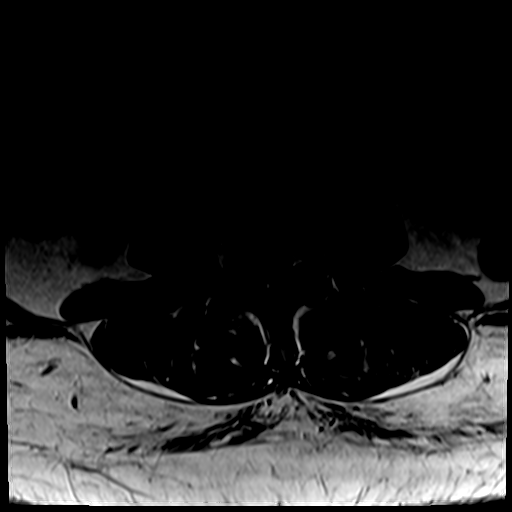
[im 19/35]
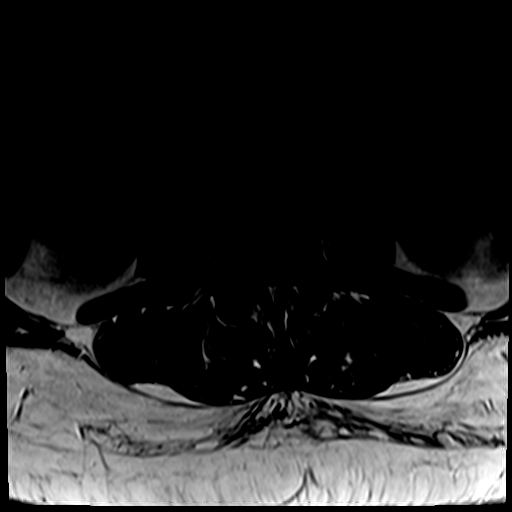
[im 24/35]
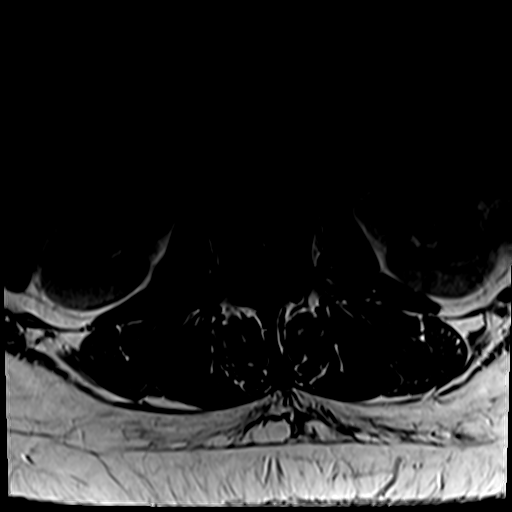
[im 29/35]
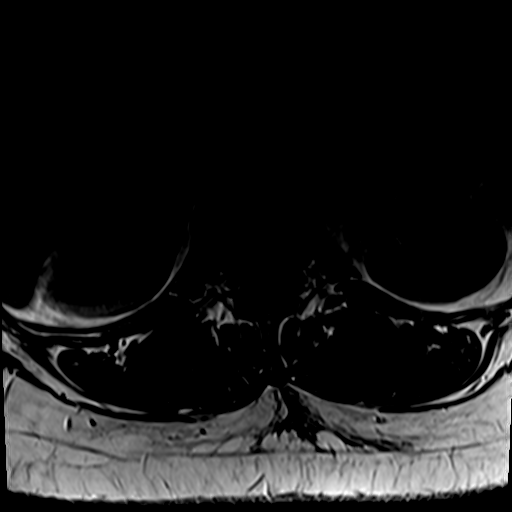
[im 35/35]
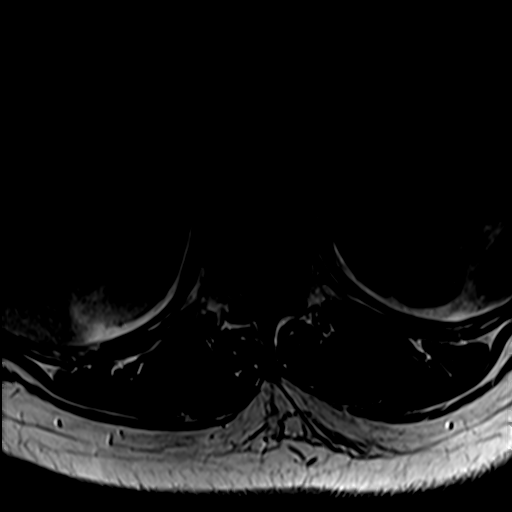

[31 of 48 positions shown; findings below may reference images not displayed]

FINDINGS: Segmentation: 5 non rib-bearing lumbar type vertebral bodies are
present. The lowest fully formed vertebral body is L5.

Alignment: No significant listhesis is present. Lumbar lordosis is
preserved.

Vertebrae:  Marrow signal and vertebral body heights are normal.

Conus medullaris and cauda equina: Conus extends to the L1 level.
Conus and cauda equina appear normal.

Paraspinal and other soft tissues: Limited imaging the abdomen is
unremarkable. There is no significant adenopathy. No solid organ
lesions are present.

Disc levels:

L1-2: Negative.

L2-3: Negative.

L3-4: Broad-based disc protrusion is asymmetric to the left.
Moderate facet hypertrophy is noted bilaterally. Moderate foraminal
stenosis is worse left than right.

L4-5: A broad-based disc protrusion is present. A far left annular
tear is noted. Moderate facet hypertrophy is present bilaterally.
Moderate foraminal stenosis is worse left than right.

L5-S1: A left paramedian disc protrusion is present. This contacts
and slightly displaces the left S1 nerve roots. Foramina are patent.
IMPRESSION: 1. Moderate foraminal stenosis bilaterally at L3-4 and L4-5 is worse
on the left than the right.
2. Left paramedian disc protrusion at L5-S1 contacts and slightly
displaces the left S1 nerve roots without significant foraminal
stenosis.

## 2022-04-21 DIAGNOSIS — M5412 Radiculopathy, cervical region: Secondary | ICD-10-CM | POA: Diagnosis not present

## 2022-04-21 DIAGNOSIS — M4802 Spinal stenosis, cervical region: Secondary | ICD-10-CM | POA: Diagnosis not present

## 2022-04-21 DIAGNOSIS — M5126 Other intervertebral disc displacement, lumbar region: Secondary | ICD-10-CM | POA: Diagnosis not present

## 2022-04-21 DIAGNOSIS — M62838 Other muscle spasm: Secondary | ICD-10-CM | POA: Diagnosis not present

## 2022-04-21 DIAGNOSIS — M5030 Other cervical disc degeneration, unspecified cervical region: Secondary | ICD-10-CM | POA: Diagnosis not present

## 2022-04-21 DIAGNOSIS — M5136 Other intervertebral disc degeneration, lumbar region: Secondary | ICD-10-CM | POA: Diagnosis not present

## 2022-04-21 DIAGNOSIS — M5416 Radiculopathy, lumbar region: Secondary | ICD-10-CM | POA: Diagnosis not present

## 2022-04-26 DIAGNOSIS — M5416 Radiculopathy, lumbar region: Secondary | ICD-10-CM | POA: Diagnosis not present

## 2022-04-26 DIAGNOSIS — M5126 Other intervertebral disc displacement, lumbar region: Secondary | ICD-10-CM | POA: Diagnosis not present

## 2022-05-19 DIAGNOSIS — M4802 Spinal stenosis, cervical region: Secondary | ICD-10-CM | POA: Diagnosis not present

## 2022-05-19 DIAGNOSIS — M5416 Radiculopathy, lumbar region: Secondary | ICD-10-CM | POA: Diagnosis not present

## 2022-05-19 DIAGNOSIS — M5412 Radiculopathy, cervical region: Secondary | ICD-10-CM | POA: Diagnosis not present

## 2022-05-19 DIAGNOSIS — M5030 Other cervical disc degeneration, unspecified cervical region: Secondary | ICD-10-CM | POA: Diagnosis not present

## 2022-05-19 DIAGNOSIS — M62838 Other muscle spasm: Secondary | ICD-10-CM | POA: Diagnosis not present

## 2022-05-19 DIAGNOSIS — M5126 Other intervertebral disc displacement, lumbar region: Secondary | ICD-10-CM | POA: Diagnosis not present

## 2022-05-19 DIAGNOSIS — M5136 Other intervertebral disc degeneration, lumbar region: Secondary | ICD-10-CM | POA: Diagnosis not present

## 2022-05-20 DIAGNOSIS — J45909 Unspecified asthma, uncomplicated: Secondary | ICD-10-CM | POA: Diagnosis not present

## 2022-06-20 DIAGNOSIS — J45909 Unspecified asthma, uncomplicated: Secondary | ICD-10-CM | POA: Diagnosis not present

## 2022-06-23 DIAGNOSIS — F331 Major depressive disorder, recurrent, moderate: Secondary | ICD-10-CM | POA: Diagnosis not present

## 2022-06-23 DIAGNOSIS — Z720 Tobacco use: Secondary | ICD-10-CM | POA: Diagnosis not present

## 2022-06-23 DIAGNOSIS — M5416 Radiculopathy, lumbar region: Secondary | ICD-10-CM | POA: Diagnosis not present

## 2022-06-23 DIAGNOSIS — R262 Difficulty in walking, not elsewhere classified: Secondary | ICD-10-CM | POA: Diagnosis not present

## 2022-06-23 DIAGNOSIS — E8809 Other disorders of plasma-protein metabolism, not elsewhere classified: Secondary | ICD-10-CM | POA: Diagnosis not present

## 2022-06-23 DIAGNOSIS — M5442 Lumbago with sciatica, left side: Secondary | ICD-10-CM | POA: Diagnosis not present

## 2022-06-23 DIAGNOSIS — E876 Hypokalemia: Secondary | ICD-10-CM | POA: Insufficient documentation

## 2022-06-23 DIAGNOSIS — M542 Cervicalgia: Secondary | ICD-10-CM | POA: Diagnosis not present

## 2022-06-23 DIAGNOSIS — M5126 Other intervertebral disc displacement, lumbar region: Secondary | ICD-10-CM | POA: Diagnosis not present

## 2022-07-21 DIAGNOSIS — J45909 Unspecified asthma, uncomplicated: Secondary | ICD-10-CM | POA: Diagnosis not present

## 2022-07-26 DIAGNOSIS — M4802 Spinal stenosis, cervical region: Secondary | ICD-10-CM | POA: Diagnosis not present

## 2022-07-26 DIAGNOSIS — M5136 Other intervertebral disc degeneration, lumbar region: Secondary | ICD-10-CM | POA: Diagnosis not present

## 2022-07-26 DIAGNOSIS — M5030 Other cervical disc degeneration, unspecified cervical region: Secondary | ICD-10-CM | POA: Diagnosis not present

## 2022-07-26 DIAGNOSIS — M5126 Other intervertebral disc displacement, lumbar region: Secondary | ICD-10-CM | POA: Diagnosis not present

## 2022-07-26 DIAGNOSIS — M5412 Radiculopathy, cervical region: Secondary | ICD-10-CM | POA: Diagnosis not present

## 2022-07-26 DIAGNOSIS — M5416 Radiculopathy, lumbar region: Secondary | ICD-10-CM | POA: Diagnosis not present

## 2022-07-27 ENCOUNTER — Other Ambulatory Visit: Payer: Self-pay | Admitting: Physical Medicine and Rehabilitation

## 2022-07-27 DIAGNOSIS — M5416 Radiculopathy, lumbar region: Secondary | ICD-10-CM

## 2022-08-05 ENCOUNTER — Other Ambulatory Visit: Payer: Self-pay

## 2022-08-05 ENCOUNTER — Ambulatory Visit
Admission: RE | Admit: 2022-08-05 | Discharge: 2022-08-05 | Disposition: A | Discharge status: Home / Self Care | Payer: Self-pay | Source: Ambulatory Visit | Attending: Orthopedic Surgery | Admitting: Orthopedic Surgery

## 2022-08-05 DIAGNOSIS — Z049 Encounter for examination and observation for unspecified reason: Secondary | ICD-10-CM

## 2022-08-09 ENCOUNTER — Ambulatory Visit
Admission: RE | Admit: 2022-08-09 | Discharge: 2022-08-09 | Disposition: A | Discharge status: Home / Self Care | Payer: Self-pay | Source: Ambulatory Visit | Attending: Orthopedic Surgery | Admitting: Orthopedic Surgery

## 2022-08-09 DIAGNOSIS — Z049 Encounter for examination and observation for unspecified reason: Secondary | ICD-10-CM

## 2022-08-10 NOTE — Progress Notes (Signed)
Referring Physician:  Harvest Dark, [address] [place],  [place] [address]  Primary Physician:  Tracie Harrier, MD  History of Present Illness: 08/13/2022 Ms. Sheryl Silva has been seeing PMR at [facility] and [facility]).   She has constant neck pain with right arm pain to her hand x 4 months, but has been severe in last 2 months. She has numbness, tingling, and weakness in her right arm. No left arm pain. She feels like her balance is off. She has difficulty opening a water bottle and picking things up. She has headaches as well. No alleviating factors. She has trouble sleeping at night due to pain.   She also has constant mid to lower back pain with radiation to buttocks and right posterior leg pain to her foot. No left leg pain. She has numbness and tingling in both feet. No weakness in her legs. No alleviating factors.   Waiting to be seen by the pain clinic [facility]).   Leaving to [place] tomorrow to stay with her daughter for a couple of weeks for help.  She is on wait list for PT at [facility]. She is being scheduled for bilateral L4-L5 TF ESI with Dr. Sharlet Salina.   Conservative measures:  Physical therapy: on wait list for [facility]  Multimodal medical therapy including regular antiinflammatories: flexeril, percocet, ultram, neurontin, motrin Injections:  04/26/2022: Left L5-S1 and left S1 transforaminal ESI (mild relief for approximately 2 days) 04/21/2022: Left trapezius ridge trigger point injection (no relief)  Past Surgery: no spinal surgery.   Sheryl Silva has symptoms of cervical myelopathy.  The symptoms are causing a significant impact on the patient's life.   Review of Systems:  A 10 point review of systems is negative, except for the pertinent positives and negatives detailed in the HPI.  Past Medical History: Past Medical History:  Diagnosis Date   Hypertension     Past Surgical History: Past  Surgical History:  Procedure Laterality Date   CESAREAN SECTION      Allergies: Allergies as of 08/13/2022   (No Known Allergies)    Medications: Outpatient Encounter Medications as of 08/13/2022  Medication Sig   albuterol (PROVENTIL) (2.5 MG/3ML) 0.083% nebulizer solution Take 2.5 mg by nebulization every 6 (six) hours as needed for wheezing or shortness of breath.   atenolol-chlorthalidone (TENORETIC) 50-25 MG per tablet Take 1 tablet by mouth daily.    atorvastatin (LIPITOR) 10 MG tablet Take 10 mg by mouth daily.   cetirizine (ZYRTEC ALLERGY) 10 MG tablet Take 1 tablet (10 mg total) by mouth daily.   cyanocobalamin (VITAMIN B12) 500 MCG tablet Take 500 mcg by mouth daily.   DULoxetine (CYMBALTA) 30 MG capsule Take 30 mg by mouth daily.   gabapentin (NEURONTIN) 300 MG capsule Take 300 mg by mouth 3 (three) times daily.   Multiple Vitamin (MULTIVITAMIN) tablet Take 1 tablet by mouth daily.   omeprazole (PRILOSEC) 20 MG capsule Take 20 mg by mouth daily.   potassium chloride (KLOR-CON) 10 MEQ tablet Take 10 mEq by mouth daily.   fluticasone (FLONASE) 50 MCG/ACT nasal spray Place 1 spray into both nostrils daily.   [DISCONTINUED] dicyclomine (BENTYL) 20 MG tablet Take 1 tablet (20 mg total) by mouth 3 (three) times daily as needed (abdominal pain).   [DISCONTINUED] escitalopram (LEXAPRO) 10 MG tablet Take 1 tablet (10 mg total) by mouth daily.   [DISCONTINUED] ibuprofen (ADVIL,MOTRIN) 800 MG tablet Take 800 mg by mouth every 8 (eight) hours  as needed for headache or cramping.    No facility-administered encounter medications on file as of 08/13/2022.    Social History: Social History   Tobacco Use   Smoking status: Every Day    Packs/day: 0.10    Types: Cigarettes   Smokeless tobacco: Never   Tobacco comments:    About 2 cigarettes per day  Vaping Use   Vaping Use: Never used  Substance Use Topics   Alcohol use: Yes    Comment: occ   Drug use: No    Family Medical  History: History reviewed. No pertinent family history.  Physical Examination: Vitals:   08/13/22 0914  BP: (!) 145/96  Pulse: 65    General: Patient is well developed, well nourished, calm, collected, and in no apparent distress. Attention to examination is appropriate.  Respiratory: Patient is breathing without any difficulty.   NEUROLOGICAL:     Awake, alert, oriented to person, place, and time.  Speech is clear and fluent. Fund of knowledge is appropriate.   Cranial Nerves: Pupils equal round and reactive to light.  Facial tone is symmetric.  Facial sensation is symmetric.  ROM of spine:  Limited ROM of cervical spine with pain Limited ROM of lumbar spine with pain  No abnormal lesions on exposed skin.   Strength: Side Biceps Triceps Deltoid Interossei Grip Wrist Ext. Wrist Flex.  R 4 5 5 4 4 5 5   L 5 5 5 5 5 5 5    Side Iliopsoas Quads Hamstring PF DF EHL  R 5 5 5 5 5 5   L 5 5 5 5 5 5    Reflexes are 2+ and symmetric at the biceps, triceps, brachioradialis, patella and achilles.   Hoffman's is absent.  Clonus is not present.   Bilateral upper and lower extremity sensation is intact to light touch, but diminished right arm from elbow down and entire right leg.   Gait is slow and unsteady. She ambulates with a cane.    Medical Decision Making  Imaging: MRI cervical spine 04/04/22:  FINDINGS:  Alignment: Trace retrolisthesis of C4 on C5 and straightening of lordotic  curvature.  Bone marrow Signal: no suspicious lesions  Spinal cord: No compression.  Normal in size and signal.   Paraspinal Soft Tissues: Unremarkable.   Occiput-C1: no significant degenerative change  Atlanto-dental interval: normal  C1-2 lateral masses: no significant degenerative change   C2-C3: Unremarkable.  C3-C4: Small central disc protrusion. No significant stenosis.  C4-C5: Right central disc extrusion abuts abuts and flattens the ventral  cervical spinal cord. In combination with  uncovertebral degenerative  changes there is mild-to-moderate right neuroforaminal stenosis, no  significant left neuroforaminal stenosis.  C5-C6: Unremarkable.  C6-C7: Mild disc bulge. No significant stenosis.  C7-T1: Unremarkable.    IMPRESSION:  Degenerative changes of the cervical spine most notably at the C4-C5 level  where there is right central disc extrusion which results in flattening of  the ventral spinal cord at this level and moderate to severe central spinal  stenosis involving the right aspect of the spinal canal.   The preliminary report (critical or emergent communication) was reviewed  prior to this dictation and there are no substantial differences between  the preliminary results and the impressions in this final report.    Electronically Reviewed by:  Jackqulyn Livings, MD, [facility]  Electronically Reviewed on:  04/05/2022 9:28 AM   I have reviewed the images and concur with the above findings.   Electronically Signed by:  Thayer Jew  Barbie Banner, MD, [facility]  Electronically Signed on:  04/05/2022 12:31 PM  MRI lumbar spine 04/17/22:  FINDINGS: Segmentation: 5 non rib-bearing lumbar type vertebral bodies are present. The lowest fully formed vertebral body is L5.   Alignment: No significant listhesis is present. Lumbar lordosis is preserved.   Vertebrae:  Marrow signal and vertebral body heights are normal.   Conus medullaris and cauda equina: Conus extends to the L1 level. Conus and cauda equina appear normal.   Paraspinal and other soft tissues: Limited imaging the abdomen is unremarkable. There is no significant adenopathy. No solid organ lesions are present.   Disc levels:   L1-2: Negative.   L2-3: Negative.   L3-4: Broad-based disc protrusion is asymmetric to the left. Moderate facet hypertrophy is noted bilaterally. Moderate foraminal stenosis is worse left than right.   L4-5: A broad-based disc protrusion is present. A far left annular tear  is noted. Moderate facet hypertrophy is present bilaterally. Moderate foraminal stenosis is worse left than right.   L5-S1: A left paramedian disc protrusion is present. This contacts and slightly displaces the left S1 nerve roots. Foramina are patent.   IMPRESSION: 1. Moderate foraminal stenosis bilaterally at L3-4 and L4-5 is worse on the left than the right. 2. Left paramedian disc protrusion at L5-S1 contacts and slightly displaces the left S1 nerve roots without significant foraminal stenosis.     Electronically Signed   By: San Morelle M.D.   On: 04/17/2022 13:57  I have personally reviewed the images and agree with the above interpretation.  Xrays of lumbar spine 03/15/22: Slip at L4-L5 with mild DDD. Report not available for review.   Assessment and Plan: Ms. Kurzawa is a pleasant 46 y.o. female with constant neck pain with right arm pain to her hand x 4 months, but has been severe in last 2 months. No left arm pain. She feels like her balance is off. She has difficulty opening a water bottle and picking things up.   She has weakness in right arm on exam. She has balance issues, but no signs of myelopathy. She has known right sided disc at C4-C5 level with flattening of  the ventral spinal cord at this level and moderate to severe central spinal stenosis on the right. This is likely cause of above symptoms.   She also has constant mid to lower back pain with radiation to buttocks and right posterior leg pain to her foot. No left leg pain.   She has known moderate foraminal stenosis bilaterally at L3-4 and L4-5, worse on left. Also with left sided disc at L5-S1 that displaces the left S1 nerve roots. LBP and right leg pain are likely multifactorial.   Treatment options discussed with patient and following plan made:   - Recommend she see Dr. Izora Ribas to discuss surgery options for her cervical spine due to right arm weakness and findings of cervical stenosis on her  MRI.  - Smoking cessation discussed and recommended prior to any cervical surgery.  - Order for physical therapy for lumbar spine. Orders printed for her to take to [place]. Will be there with her daughter for a few weeks.  - Agree with repeat lumbar ESI with Dr. Sharlet Salina.  - She asked about pain medication and we reviewed our office policy. She is to discuss further with PCP. May need referral to another pain clinic.  - She has known depression that has been worse recently. No SI/HI. Discussed she should go to ED if this changes.  -  BP today was 145/96 and this is likely elevated due to pain. Advised to check at home and if it remains elevated to call her PCP.  - Patient called me back into the room after her visit and at her request, I spoke with her friend on speaker phone about above plan.  - She will f/u with Dr. Izora Ribas on 09/02/22. Sooner appointment declined by patient as she will out of town.   Of note, Dr. Izora Ribas was on his way over from the hospital and I offered for her to wait 30 minutes to see him. She declined.   She was in the lobby when he arrived and I went back out to make sure she did not want to see him now to discuss possible cervical surgery. She declined stating she was too tired and that she needed her daughter or her friend to be with her to help her make a decision about surgery.   I spent a total of 45 minutes in face-to-face and non-face-to-face activities related to this patient's care today.  Thank you for involving me in the care of this patient.   Geronimo Boot PA-C Dept. of Neurosurgery

## 2022-08-13 ENCOUNTER — Encounter: Payer: Self-pay | Admitting: Orthopedic Surgery

## 2022-08-13 ENCOUNTER — Ambulatory Visit: Payer: 59 | Admitting: Orthopedic Surgery

## 2022-08-13 VITALS — BP 145/96 | HR 65 | Ht 66.0 in | Wt 186.0 lb

## 2022-08-13 DIAGNOSIS — M4316 Spondylolisthesis, lumbar region: Secondary | ICD-10-CM

## 2022-08-13 DIAGNOSIS — M5416 Radiculopathy, lumbar region: Secondary | ICD-10-CM

## 2022-08-13 DIAGNOSIS — M5010 Cervical disc disorder with radiculopathy, unspecified cervical region: Secondary | ICD-10-CM

## 2022-08-13 DIAGNOSIS — M5136 Other intervertebral disc degeneration, lumbar region: Secondary | ICD-10-CM

## 2022-08-13 DIAGNOSIS — R29898 Other symptoms and signs involving the musculoskeletal system: Secondary | ICD-10-CM | POA: Diagnosis not present

## 2022-08-13 DIAGNOSIS — M51369 Other intervertebral disc degeneration, lumbar region without mention of lumbar back pain or lower extremity pain: Secondary | ICD-10-CM

## 2022-08-13 NOTE — Patient Instructions (Signed)
It was so nice to see you today, I am sorry that you are hurting so much.   You have a herniated disc in your neck that is likely causing your neck and arm pain/weakness.   I want you to see Dr. Izora Ribas for this to discuss surgery. We will schedule this for you.   You also have wear and tear in your lower back and this is likely causing your back and leg pain.   I agree with the injection you have scheduled with Dr. Sharlet Salina in your lower back.   I recommend physical therapy for your lower back. You can start in [place] if you like. We will give you a prescription.   Discuss pain medications with your PCP.   Try to cut down/quit smoking- this interferes with healing after surgery.   Please do not hesitate to call if you have any questions or concerns. You can also message me in [place].   Geronimo Boot PA-C [phone]

## 2022-09-02 ENCOUNTER — Ambulatory Visit: Payer: 59 | Admitting: Neurosurgery

## 2022-09-13 DIAGNOSIS — G894 Chronic pain syndrome: Secondary | ICD-10-CM | POA: Diagnosis not present

## 2022-09-13 DIAGNOSIS — Z79891 Long term (current) use of opiate analgesic: Secondary | ICD-10-CM | POA: Diagnosis not present

## 2022-09-13 DIAGNOSIS — G8929 Other chronic pain: Secondary | ICD-10-CM | POA: Diagnosis not present

## 2022-09-13 DIAGNOSIS — M545 Low back pain, unspecified: Secondary | ICD-10-CM | POA: Diagnosis not present

## 2022-09-13 DIAGNOSIS — M542 Cervicalgia: Secondary | ICD-10-CM | POA: Diagnosis not present

## 2022-09-13 DIAGNOSIS — M62838 Other muscle spasm: Secondary | ICD-10-CM | POA: Diagnosis not present

## 2022-10-08 DIAGNOSIS — N898 Other specified noninflammatory disorders of vagina: Secondary | ICD-10-CM | POA: Diagnosis not present

## 2022-10-08 DIAGNOSIS — R399 Unspecified symptoms and signs involving the genitourinary system: Secondary | ICD-10-CM | POA: Diagnosis not present

## 2022-10-08 DIAGNOSIS — Z113 Encounter for screening for infections with a predominantly sexual mode of transmission: Secondary | ICD-10-CM | POA: Diagnosis not present

## 2022-10-12 DIAGNOSIS — G8929 Other chronic pain: Secondary | ICD-10-CM | POA: Diagnosis not present

## 2022-10-12 DIAGNOSIS — M545 Low back pain, unspecified: Secondary | ICD-10-CM | POA: Diagnosis not present

## 2022-10-12 DIAGNOSIS — Z79891 Long term (current) use of opiate analgesic: Secondary | ICD-10-CM | POA: Diagnosis not present

## 2022-10-12 DIAGNOSIS — M542 Cervicalgia: Secondary | ICD-10-CM | POA: Diagnosis not present

## 2022-10-12 DIAGNOSIS — G894 Chronic pain syndrome: Secondary | ICD-10-CM | POA: Diagnosis not present

## 2022-10-12 DIAGNOSIS — M62838 Other muscle spasm: Secondary | ICD-10-CM | POA: Diagnosis not present

## 2022-11-09 DIAGNOSIS — Z79891 Long term (current) use of opiate analgesic: Secondary | ICD-10-CM | POA: Diagnosis not present

## 2022-11-09 DIAGNOSIS — G894 Chronic pain syndrome: Secondary | ICD-10-CM | POA: Diagnosis not present

## 2022-11-09 DIAGNOSIS — M62838 Other muscle spasm: Secondary | ICD-10-CM | POA: Diagnosis not present

## 2022-11-09 DIAGNOSIS — M545 Low back pain, unspecified: Secondary | ICD-10-CM | POA: Diagnosis not present

## 2022-11-09 DIAGNOSIS — M542 Cervicalgia: Secondary | ICD-10-CM | POA: Diagnosis not present

## 2022-11-09 DIAGNOSIS — G8929 Other chronic pain: Secondary | ICD-10-CM | POA: Diagnosis not present

## 2022-12-01 DIAGNOSIS — F331 Major depressive disorder, recurrent, moderate: Secondary | ICD-10-CM | POA: Diagnosis not present

## 2022-12-01 DIAGNOSIS — R209 Unspecified disturbances of skin sensation: Secondary | ICD-10-CM | POA: Diagnosis not present

## 2022-12-01 DIAGNOSIS — M79645 Pain in left finger(s): Secondary | ICD-10-CM | POA: Diagnosis not present

## 2022-12-01 DIAGNOSIS — R262 Difficulty in walking, not elsewhere classified: Secondary | ICD-10-CM | POA: Diagnosis not present

## 2022-12-01 DIAGNOSIS — Z720 Tobacco use: Secondary | ICD-10-CM | POA: Diagnosis not present

## 2022-12-01 DIAGNOSIS — M4802 Spinal stenosis, cervical region: Secondary | ICD-10-CM | POA: Diagnosis not present

## 2022-12-01 DIAGNOSIS — G8929 Other chronic pain: Secondary | ICD-10-CM | POA: Diagnosis not present

## 2022-12-01 DIAGNOSIS — M79644 Pain in right finger(s): Secondary | ICD-10-CM | POA: Diagnosis not present

## 2022-12-01 DIAGNOSIS — R202 Paresthesia of skin: Secondary | ICD-10-CM | POA: Diagnosis not present

## 2022-12-01 DIAGNOSIS — E538 Deficiency of other specified B group vitamins: Secondary | ICD-10-CM | POA: Diagnosis not present

## 2022-12-05 DIAGNOSIS — M79642 Pain in left hand: Secondary | ICD-10-CM | POA: Diagnosis not present

## 2022-12-05 DIAGNOSIS — R200 Anesthesia of skin: Secondary | ICD-10-CM | POA: Diagnosis not present

## 2022-12-05 DIAGNOSIS — M79641 Pain in right hand: Secondary | ICD-10-CM | POA: Diagnosis not present

## 2022-12-05 DIAGNOSIS — F17210 Nicotine dependence, cigarettes, uncomplicated: Secondary | ICD-10-CM | POA: Diagnosis not present

## 2022-12-07 DIAGNOSIS — M62838 Other muscle spasm: Secondary | ICD-10-CM | POA: Diagnosis not present

## 2022-12-07 DIAGNOSIS — G894 Chronic pain syndrome: Secondary | ICD-10-CM | POA: Diagnosis not present

## 2022-12-07 DIAGNOSIS — M545 Low back pain, unspecified: Secondary | ICD-10-CM | POA: Diagnosis not present

## 2022-12-07 DIAGNOSIS — Z79891 Long term (current) use of opiate analgesic: Secondary | ICD-10-CM | POA: Diagnosis not present

## 2022-12-07 DIAGNOSIS — M542 Cervicalgia: Secondary | ICD-10-CM | POA: Diagnosis not present

## 2022-12-07 DIAGNOSIS — G8929 Other chronic pain: Secondary | ICD-10-CM | POA: Diagnosis not present

## 2022-12-10 ENCOUNTER — Ambulatory Visit (INDEPENDENT_AMBULATORY_CARE_PROVIDER_SITE_OTHER): Payer: 59 | Admitting: Vascular Surgery

## 2022-12-10 ENCOUNTER — Encounter (INDEPENDENT_AMBULATORY_CARE_PROVIDER_SITE_OTHER): Payer: Self-pay | Admitting: Vascular Surgery

## 2022-12-10 VITALS — BP 136/86 | HR 96 | Resp 16

## 2022-12-10 DIAGNOSIS — R230 Cyanosis: Secondary | ICD-10-CM | POA: Diagnosis not present

## 2022-12-10 DIAGNOSIS — I10 Essential (primary) hypertension: Secondary | ICD-10-CM | POA: Diagnosis not present

## 2022-12-10 NOTE — Progress Notes (Signed)
Patient ID: Sheryl Silva, female   DOB: 06/03/1976, 47 y.o.   MRN: [id number]  Chief Complaint  Patient presents with   New Patient (Initial Visit)    Ref Hande consult paresthesia of both hands, bilateral pain in fingers    HPI Sheryl Silva is a 47 y.o. female.  I am asked to see the patient by Dr. Ginette Pitman for evaluation of painful, cool and discolored fingers on both hands.  Patient reports this happened abruptly about 3 to 4 weeks ago.  This is very painful.  She describes it as a 10 out of 10 and nothing has been helping.  She has had both injections as well as narcotic pills.  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  She has been trying to avoid cold stimulation as this seems to exacerbate her symptoms.  She does still smoke but only a couple of cigarettes a day.  She denies any open wounds or infection.  The skin has gotten thick and dark and very tender to touch on her fingertips.  She has other fingers that are cold, but the middle fingers are the worst and the most painful.  There is no clear cause or inciting event that started the symptoms.  No trauma or injury.   Past Medical History:  Diagnosis Date   Anxiety    Depression    Hypertension     Past Surgical History:  Procedure Laterality Date   CESAREAN SECTION       Family History No bleeding disorders, clotting disorders, or autoimmune diseases. Mother died from an aneurysm   Social History   Tobacco Use   Smoking status: Every Day    Packs/day: 0.10    Types: Cigarettes   Smokeless tobacco: Never   Tobacco comments:    About 2 cigarettes per day  Vaping Use   Vaping Use: Never used  Substance Use Topics   Alcohol use: Yes    Comment: occ   Drug use: No     No Known Allergies  Current Outpatient Medications  Medication Sig Dispense Refill   albuterol (PROVENTIL) (2.5 MG/3ML) 0.083% nebulizer solution Take 2.5 mg by nebulization every 6 (six) hours as needed for wheezing or  shortness of breath.     ALPRAZolam (XANAX) 0.25 MG tablet Take 0.25 mg by mouth at bedtime as needed for anxiety.     atorvastatin (LIPITOR) 10 MG tablet Take 10 mg by mouth daily.     cetirizine (ZYRTEC ALLERGY) 10 MG tablet Take 1 tablet (10 mg total) by mouth daily. 30 tablet 0   cyanocobalamin (VITAMIN B12) 500 MCG tablet Take 500 mcg by mouth daily.     cyclobenzaprine (FLEXERIL) 10 MG tablet Take 10 mg by mouth 3 (three) times daily as needed for muscle spasms.     DULoxetine (CYMBALTA) 30 MG capsule Take 30 mg by mouth daily.     gabapentin (NEURONTIN) 300 MG capsule Take 300 mg by mouth 3 (three) times daily.     Multiple Vitamin (MULTIVITAMIN) tablet Take 1 tablet by mouth daily.     NIFEdipine (ADALAT CC) 30 MG 24 hr tablet Take 30 mg by mouth daily.     omeprazole (PRILOSEC) 20 MG capsule Take 20 mg by mouth daily.     oxyCODONE-acetaminophen (PERCOCET/ROXICET) 5-325 MG tablet Take 1 tablet by mouth 2 (two) times daily.     potassium chloride (KLOR-CON) 10 MEQ tablet Take 10 mEq by mouth daily.  atenolol-chlorthalidone (TENORETIC) 50-25 MG per tablet Take 1 tablet by mouth daily.  (Patient not taking: Reported on 12/10/2022)     No current facility-administered medications for this visit.      REVIEW OF SYSTEMS (Negative unless checked)  Constitutional: []$ Weight loss  []$ Fever  []$ Chills Cardiac: []$ Chest pain   []$ Chest pressure   []$ Palpitations   []$ Shortness of breath when laying flat   []$ Shortness of breath at rest   []$ Shortness of breath with exertion. Vascular:  []$ Pain in legs with walking   []$ Pain in legs at rest   []$ Pain in legs when laying flat   []$ Claudication   []$ Pain in feet when walking  []$ Pain in feet at rest  []$ Pain in feet when laying flat   []$ History of DVT   []$ Phlebitis   []$ Swelling in legs   []$ Varicose veins   []$ Non-healing ulcers Pulmonary:   []$ Uses home oxygen   []$ Productive cough   []$ Hemoptysis   []$ Wheeze  []$ COPD   []$ Asthma Neurologic:  []$ Dizziness  []$ Blackouts    []$ Seizures   []$ History of stroke   []$ History of TIA  []$ Aphasia   []$ Temporary blindness   []$ Dysphagia   []$ Weakness or numbness in arms   []$ Weakness or numbness in legs Musculoskeletal:  [x]$ Arthritis   []$ Joint swelling   []$ Joint pain   [x]$ Low back pain Hematologic:  []$ Easy bruising  []$ Easy bleeding   []$ Hypercoagulable state   []$ Anemic  []$ Hepatitis Gastrointestinal:  []$ Blood in stool   []$ Vomiting blood  []$ Gastroesophageal reflux/heartburn   []$ Abdominal pain Genitourinary:  []$ Chronic kidney disease   []$ Difficult urination  []$ Frequent urination  []$ Burning with urination   []$ Hematuria Skin:  []$ Rashes   []$ Ulcers   []$ Wounds Psychological:  [x]$ History of anxiety   [x]$  History of major depression.    Physical Exam BP 136/86 (BP Location: Left Arm)   Pulse 96   Resp 16  Gen:  WD/WN, NAD Head: [place]/AT, No temporalis wasting.  Ear/Nose/Throat: Hearing grossly intact, nares w/o erythema or drainage, oropharynx w/o Erythema/Exudate Eyes: Conjunctiva clear, sclera non-icteric  Neck: trachea midline.  No JVD.  Pulmonary:  Good air movement, respirations not labored, no use of accessory muscles  Cardiac: RRR, no JVD Vascular:  Vessel Right Left  Radial Palpable Palpable                                   Gastrointestinal:. No masses, surgical incisions, or scars. Musculoskeletal: M/S 5/5 throughout.  Dark discoloration of the tips of the middle fingers bilaterally.  The second and third fingers are cool to touch.  The middle fingers are tender to palpation. Neurologic: Sensation grossly intact in extremities.  Symmetrical.  Speech is fluent. Motor exam as listed above. Psychiatric: Judgment intact, Mood & affect appropriate for pt's clinical situation. Dermatologic: No rashes or ulcers noted.  No cellulitis or open wounds.    Radiology No results found.  Labs No results found for this or any previous visit (from the past 2160 hour(s)).  Assessment/Plan:  Cyanosis of tip of finger The  patient has dark discoloration which is persistent on the tips of the third fingers bilaterally.  This is painful and cool.  She has some pallor and discoloration of other fingertips, but the third finger on each side is the most severe.  The fact that this happened simultaneously bilaterally would rule against native atherosclerotic disease in most instances.  Thoracic aortic aneurysm or severe atherosclerotic disease of the thoracic aortic could cause  embolization to both sides concomitantly but that is not his typical.  Cardiac embolization would also be a possibility.  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  She is already on nifedipine which would be a treatment for vasospastic disorder.  To evaluate for thoracic aneurysm, dissection, or other sources of embolization CT angiogram will be performed.  I am also going to refer her to cardiology to assess her for potential cardiac embolic source. We can see her back after her CT angiogram.    Hypertension blood pressure control important in reducing the progression of atherosclerotic disease. On appropriate oral medications.      Leotis Pain 12/10/2022, 5:11 PM   This note was created with Dragon medical transcription system.  Any errors from dictation are unintentional.

## 2022-12-10 NOTE — Assessment & Plan Note (Signed)
The patient has dark discoloration which is persistent on the tips of the third fingers bilaterally.  This is painful and cool.  She has some pallor and discoloration of other fingertips, but the third finger on each side is the most severe.  The fact that this happened simultaneously bilaterally would rule against native atherosclerotic disease in most instances.  Thoracic aortic aneurysm or severe atherosclerotic disease of the thoracic aortic could cause embolization to both sides concomitantly but that is not his typical.  Cardiac embolization would also be a possibility.  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  She is already on nifedipine which would be a treatment for vasospastic disorder.  To evaluate for thoracic aneurysm, dissection, or other sources of embolization CT angiogram will be performed.  I am also going to refer her to cardiology to assess her for potential cardiac embolic source. We can see her back after her CT angiogram.

## 2022-12-10 NOTE — Assessment & Plan Note (Signed)
blood pressure control important in reducing the progression of atherosclerotic disease. On appropriate oral medications.  

## 2022-12-15 ENCOUNTER — Telehealth (INDEPENDENT_AMBULATORY_CARE_PROVIDER_SITE_OTHER): Payer: Self-pay | Admitting: Vascular Surgery

## 2022-12-15 NOTE — Telephone Encounter (Signed)
Patient called stating that her fingers are red and painful. They are hurting worse. Patient states that they have not been red before this. She knows that she needs a CT (I am waiting on prior auth and pt knows that). She wants to know if she should go to the ED or wait to have the CT. Please advise.

## 2022-12-15 NOTE — Telephone Encounter (Signed)
If she's in pain and unable to wait, I would advise going to the ED. I'd also strongly advise her to stop smoking because that is certainly making this worse

## 2022-12-16 ENCOUNTER — Ambulatory Visit: Payer: 59

## 2022-12-16 ENCOUNTER — Encounter (INDEPENDENT_AMBULATORY_CARE_PROVIDER_SITE_OTHER): Payer: 59 | Admitting: Vascular Surgery

## 2022-12-16 NOTE — Telephone Encounter (Signed)
Left a detailed message on patient voicemail

## 2022-12-17 DIAGNOSIS — Q789 Osteochondrodysplasia, unspecified: Secondary | ICD-10-CM | POA: Diagnosis not present

## 2022-12-17 DIAGNOSIS — R0789 Other chest pain: Secondary | ICD-10-CM | POA: Diagnosis not present

## 2022-12-17 DIAGNOSIS — M79645 Pain in left finger(s): Secondary | ICD-10-CM | POA: Diagnosis not present

## 2022-12-17 DIAGNOSIS — M7989 Other specified soft tissue disorders: Secondary | ICD-10-CM | POA: Diagnosis not present

## 2022-12-17 DIAGNOSIS — R590 Localized enlarged lymph nodes: Secondary | ICD-10-CM | POA: Diagnosis not present

## 2022-12-17 DIAGNOSIS — R079 Chest pain, unspecified: Secondary | ICD-10-CM | POA: Diagnosis not present

## 2022-12-17 DIAGNOSIS — M79644 Pain in right finger(s): Secondary | ICD-10-CM | POA: Diagnosis not present

## 2022-12-17 DIAGNOSIS — I700 Atherosclerosis of aorta: Secondary | ICD-10-CM | POA: Diagnosis not present

## 2022-12-17 DIAGNOSIS — M8500 Fibrous dysplasia (monostotic), unspecified site: Secondary | ICD-10-CM | POA: Diagnosis not present

## 2022-12-17 NOTE — Telephone Encounter (Signed)
Received a denial for the CT requested by Dr. Lucky Cowboy.  I LVM for pt explaining that we will be requesting a peer to peer with her insurance. I advised that I will update her as soon as I know a little more information.

## 2022-12-18 DIAGNOSIS — I10 Essential (primary) hypertension: Secondary | ICD-10-CM | POA: Insufficient documentation

## 2022-12-18 DIAGNOSIS — M8500 Fibrous dysplasia (monostotic), unspecified site: Secondary | ICD-10-CM | POA: Insufficient documentation

## 2022-12-18 DIAGNOSIS — Q789 Osteochondrodysplasia, unspecified: Secondary | ICD-10-CM | POA: Diagnosis not present

## 2022-12-18 DIAGNOSIS — E8809 Other disorders of plasma-protein metabolism, not elsewhere classified: Secondary | ICD-10-CM | POA: Diagnosis not present

## 2022-12-18 DIAGNOSIS — M79645 Pain in left finger(s): Secondary | ICD-10-CM | POA: Insufficient documentation

## 2022-12-18 DIAGNOSIS — R809 Proteinuria, unspecified: Secondary | ICD-10-CM | POA: Diagnosis not present

## 2022-12-18 DIAGNOSIS — M79601 Pain in right arm: Secondary | ICD-10-CM | POA: Diagnosis not present

## 2022-12-18 DIAGNOSIS — M7989 Other specified soft tissue disorders: Secondary | ICD-10-CM | POA: Diagnosis not present

## 2022-12-18 DIAGNOSIS — R590 Localized enlarged lymph nodes: Secondary | ICD-10-CM | POA: Diagnosis not present

## 2022-12-18 DIAGNOSIS — I771 Stricture of artery: Secondary | ICD-10-CM | POA: Diagnosis not present

## 2022-12-18 DIAGNOSIS — C773 Secondary and unspecified malignant neoplasm of axilla and upper limb lymph nodes: Secondary | ICD-10-CM | POA: Diagnosis not present

## 2022-12-18 DIAGNOSIS — G9589 Other specified diseases of spinal cord: Secondary | ICD-10-CM | POA: Diagnosis not present

## 2022-12-18 DIAGNOSIS — C801 Malignant (primary) neoplasm, unspecified: Secondary | ICD-10-CM | POA: Diagnosis not present

## 2022-12-18 DIAGNOSIS — F331 Major depressive disorder, recurrent, moderate: Secondary | ICD-10-CM | POA: Diagnosis not present

## 2022-12-18 DIAGNOSIS — I731 Thromboangiitis obliterans [Buerger's disease]: Secondary | ICD-10-CM | POA: Diagnosis not present

## 2022-12-18 DIAGNOSIS — R079 Chest pain, unspecified: Secondary | ICD-10-CM | POA: Diagnosis not present

## 2022-12-18 DIAGNOSIS — M79644 Pain in right finger(s): Secondary | ICD-10-CM | POA: Diagnosis not present

## 2022-12-18 DIAGNOSIS — I700 Atherosclerosis of aorta: Secondary | ICD-10-CM | POA: Diagnosis not present

## 2022-12-18 DIAGNOSIS — C799 Secondary malignant neoplasm of unspecified site: Secondary | ICD-10-CM | POA: Diagnosis not present

## 2022-12-18 DIAGNOSIS — F411 Generalized anxiety disorder: Secondary | ICD-10-CM | POA: Diagnosis not present

## 2022-12-18 DIAGNOSIS — F17209 Nicotine dependence, unspecified, with unspecified nicotine-induced disorders: Secondary | ICD-10-CM | POA: Diagnosis not present

## 2022-12-18 DIAGNOSIS — M79602 Pain in left arm: Secondary | ICD-10-CM | POA: Diagnosis not present

## 2022-12-18 DIAGNOSIS — Z171 Estrogen receptor negative status [ER-]: Secondary | ICD-10-CM | POA: Diagnosis not present

## 2022-12-18 DIAGNOSIS — M899 Disorder of bone, unspecified: Secondary | ICD-10-CM | POA: Diagnosis not present

## 2022-12-18 DIAGNOSIS — F17200 Nicotine dependence, unspecified, uncomplicated: Secondary | ICD-10-CM | POA: Diagnosis not present

## 2022-12-18 DIAGNOSIS — R92323 Mammographic fibroglandular density, bilateral breasts: Secondary | ICD-10-CM | POA: Diagnosis not present

## 2022-12-18 DIAGNOSIS — R0789 Other chest pain: Secondary | ICD-10-CM | POA: Diagnosis not present

## 2022-12-19 DIAGNOSIS — R079 Chest pain, unspecified: Secondary | ICD-10-CM | POA: Diagnosis not present

## 2022-12-19 DIAGNOSIS — R809 Proteinuria, unspecified: Secondary | ICD-10-CM | POA: Diagnosis not present

## 2022-12-19 DIAGNOSIS — M79645 Pain in left finger(s): Secondary | ICD-10-CM | POA: Diagnosis not present

## 2022-12-19 DIAGNOSIS — M79644 Pain in right finger(s): Secondary | ICD-10-CM | POA: Diagnosis not present

## 2022-12-19 DIAGNOSIS — M8500 Fibrous dysplasia (monostotic), unspecified site: Secondary | ICD-10-CM | POA: Diagnosis not present

## 2022-12-20 DIAGNOSIS — I731 Thromboangiitis obliterans [Buerger's disease]: Secondary | ICD-10-CM | POA: Diagnosis not present

## 2022-12-20 DIAGNOSIS — M8500 Fibrous dysplasia (monostotic), unspecified site: Secondary | ICD-10-CM | POA: Diagnosis not present

## 2022-12-20 DIAGNOSIS — M79645 Pain in left finger(s): Secondary | ICD-10-CM | POA: Diagnosis not present

## 2022-12-20 DIAGNOSIS — M79644 Pain in right finger(s): Secondary | ICD-10-CM | POA: Diagnosis not present

## 2022-12-21 DIAGNOSIS — F17200 Nicotine dependence, unspecified, uncomplicated: Secondary | ICD-10-CM | POA: Diagnosis not present

## 2022-12-21 DIAGNOSIS — M79645 Pain in left finger(s): Secondary | ICD-10-CM | POA: Diagnosis not present

## 2022-12-21 DIAGNOSIS — M79644 Pain in right finger(s): Secondary | ICD-10-CM | POA: Diagnosis not present

## 2022-12-21 DIAGNOSIS — I771 Stricture of artery: Secondary | ICD-10-CM | POA: Diagnosis not present

## 2022-12-21 DIAGNOSIS — M8500 Fibrous dysplasia (monostotic), unspecified site: Secondary | ICD-10-CM | POA: Diagnosis not present

## 2022-12-21 DIAGNOSIS — I731 Thromboangiitis obliterans [Buerger's disease]: Secondary | ICD-10-CM | POA: Diagnosis not present

## 2022-12-22 DIAGNOSIS — M8500 Fibrous dysplasia (monostotic), unspecified site: Secondary | ICD-10-CM | POA: Diagnosis not present

## 2022-12-22 DIAGNOSIS — M79644 Pain in right finger(s): Secondary | ICD-10-CM | POA: Diagnosis not present

## 2022-12-22 DIAGNOSIS — I731 Thromboangiitis obliterans [Buerger's disease]: Secondary | ICD-10-CM | POA: Diagnosis not present

## 2022-12-22 DIAGNOSIS — M79645 Pain in left finger(s): Secondary | ICD-10-CM | POA: Diagnosis not present

## 2022-12-23 DIAGNOSIS — R92323 Mammographic fibroglandular density, bilateral breasts: Secondary | ICD-10-CM | POA: Diagnosis not present

## 2022-12-23 DIAGNOSIS — R590 Localized enlarged lymph nodes: Secondary | ICD-10-CM | POA: Diagnosis not present

## 2022-12-23 DIAGNOSIS — C801 Malignant (primary) neoplasm, unspecified: Secondary | ICD-10-CM | POA: Diagnosis not present

## 2022-12-23 DIAGNOSIS — M79645 Pain in left finger(s): Secondary | ICD-10-CM | POA: Diagnosis not present

## 2022-12-23 DIAGNOSIS — M79644 Pain in right finger(s): Secondary | ICD-10-CM | POA: Diagnosis not present

## 2022-12-23 DIAGNOSIS — M8500 Fibrous dysplasia (monostotic), unspecified site: Secondary | ICD-10-CM | POA: Diagnosis not present

## 2022-12-23 DIAGNOSIS — Z171 Estrogen receptor negative status [ER-]: Secondary | ICD-10-CM | POA: Diagnosis not present

## 2022-12-23 DIAGNOSIS — I731 Thromboangiitis obliterans [Buerger's disease]: Secondary | ICD-10-CM | POA: Diagnosis not present

## 2022-12-23 DIAGNOSIS — C773 Secondary and unspecified malignant neoplasm of axilla and upper limb lymph nodes: Secondary | ICD-10-CM | POA: Diagnosis not present

## 2022-12-23 DIAGNOSIS — C799 Secondary malignant neoplasm of unspecified site: Secondary | ICD-10-CM | POA: Diagnosis not present

## 2022-12-24 DIAGNOSIS — M8500 Fibrous dysplasia (monostotic), unspecified site: Secondary | ICD-10-CM | POA: Diagnosis not present

## 2022-12-24 DIAGNOSIS — I731 Thromboangiitis obliterans [Buerger's disease]: Secondary | ICD-10-CM | POA: Diagnosis not present

## 2022-12-24 DIAGNOSIS — F17209 Nicotine dependence, unspecified, with unspecified nicotine-induced disorders: Secondary | ICD-10-CM | POA: Diagnosis not present

## 2022-12-24 DIAGNOSIS — M79645 Pain in left finger(s): Secondary | ICD-10-CM | POA: Diagnosis not present

## 2022-12-24 DIAGNOSIS — M79644 Pain in right finger(s): Secondary | ICD-10-CM | POA: Diagnosis not present

## 2022-12-25 DIAGNOSIS — M899 Disorder of bone, unspecified: Secondary | ICD-10-CM | POA: Diagnosis not present

## 2022-12-25 DIAGNOSIS — I731 Thromboangiitis obliterans [Buerger's disease]: Secondary | ICD-10-CM | POA: Diagnosis not present

## 2022-12-25 DIAGNOSIS — M79601 Pain in right arm: Secondary | ICD-10-CM | POA: Diagnosis not present

## 2022-12-25 DIAGNOSIS — M79602 Pain in left arm: Secondary | ICD-10-CM | POA: Diagnosis not present

## 2022-12-26 DIAGNOSIS — M79601 Pain in right arm: Secondary | ICD-10-CM | POA: Diagnosis not present

## 2022-12-26 DIAGNOSIS — M899 Disorder of bone, unspecified: Secondary | ICD-10-CM | POA: Diagnosis not present

## 2022-12-26 DIAGNOSIS — M79602 Pain in left arm: Secondary | ICD-10-CM | POA: Diagnosis not present

## 2022-12-26 DIAGNOSIS — I731 Thromboangiitis obliterans [Buerger's disease]: Secondary | ICD-10-CM | POA: Diagnosis not present

## 2022-12-27 DIAGNOSIS — M899 Disorder of bone, unspecified: Secondary | ICD-10-CM | POA: Diagnosis not present

## 2022-12-27 DIAGNOSIS — M79602 Pain in left arm: Secondary | ICD-10-CM | POA: Diagnosis not present

## 2022-12-27 DIAGNOSIS — I731 Thromboangiitis obliterans [Buerger's disease]: Secondary | ICD-10-CM | POA: Diagnosis not present

## 2022-12-27 DIAGNOSIS — M79601 Pain in right arm: Secondary | ICD-10-CM | POA: Diagnosis not present

## 2022-12-28 DIAGNOSIS — F17200 Nicotine dependence, unspecified, uncomplicated: Secondary | ICD-10-CM | POA: Diagnosis not present

## 2022-12-28 DIAGNOSIS — C799 Secondary malignant neoplasm of unspecified site: Secondary | ICD-10-CM | POA: Diagnosis not present

## 2022-12-28 DIAGNOSIS — I731 Thromboangiitis obliterans [Buerger's disease]: Secondary | ICD-10-CM | POA: Diagnosis not present

## 2022-12-28 DIAGNOSIS — M79601 Pain in right arm: Secondary | ICD-10-CM | POA: Diagnosis not present

## 2022-12-28 DIAGNOSIS — M79602 Pain in left arm: Secondary | ICD-10-CM | POA: Diagnosis not present

## 2022-12-28 DIAGNOSIS — M8500 Fibrous dysplasia (monostotic), unspecified site: Secondary | ICD-10-CM | POA: Diagnosis not present

## 2022-12-28 DIAGNOSIS — M899 Disorder of bone, unspecified: Secondary | ICD-10-CM | POA: Diagnosis not present

## 2022-12-29 DIAGNOSIS — R109 Unspecified abdominal pain: Secondary | ICD-10-CM | POA: Diagnosis not present

## 2022-12-29 DIAGNOSIS — M899 Disorder of bone, unspecified: Secondary | ICD-10-CM | POA: Diagnosis not present

## 2022-12-29 DIAGNOSIS — R1013 Epigastric pain: Secondary | ICD-10-CM | POA: Diagnosis not present

## 2022-12-29 DIAGNOSIS — C801 Malignant (primary) neoplasm, unspecified: Secondary | ICD-10-CM | POA: Diagnosis not present

## 2022-12-29 DIAGNOSIS — K3189 Other diseases of stomach and duodenum: Secondary | ICD-10-CM | POA: Diagnosis not present

## 2022-12-29 DIAGNOSIS — M79602 Pain in left arm: Secondary | ICD-10-CM | POA: Diagnosis not present

## 2022-12-29 DIAGNOSIS — I731 Thromboangiitis obliterans [Buerger's disease]: Secondary | ICD-10-CM | POA: Diagnosis not present

## 2022-12-29 DIAGNOSIS — M79601 Pain in right arm: Secondary | ICD-10-CM | POA: Diagnosis not present

## 2022-12-31 DIAGNOSIS — D4989 Neoplasm of unspecified behavior of other specified sites: Secondary | ICD-10-CM | POA: Diagnosis not present

## 2022-12-31 DIAGNOSIS — R58 Hemorrhage, not elsewhere classified: Secondary | ICD-10-CM | POA: Diagnosis not present

## 2022-12-31 DIAGNOSIS — I731 Thromboangiitis obliterans [Buerger's disease]: Secondary | ICD-10-CM | POA: Diagnosis not present

## 2022-12-31 DIAGNOSIS — Z87891 Personal history of nicotine dependence: Secondary | ICD-10-CM | POA: Diagnosis not present

## 2022-12-31 DIAGNOSIS — C799 Secondary malignant neoplasm of unspecified site: Secondary | ICD-10-CM | POA: Diagnosis not present

## 2022-12-31 DIAGNOSIS — I10 Essential (primary) hypertension: Secondary | ICD-10-CM | POA: Diagnosis not present

## 2023-01-11 DIAGNOSIS — D4989 Neoplasm of unspecified behavior of other specified sites: Secondary | ICD-10-CM | POA: Diagnosis not present

## 2023-01-11 DIAGNOSIS — C773 Secondary and unspecified malignant neoplasm of axilla and upper limb lymph nodes: Secondary | ICD-10-CM | POA: Diagnosis not present

## 2023-01-11 DIAGNOSIS — C801 Malignant (primary) neoplasm, unspecified: Secondary | ICD-10-CM | POA: Diagnosis not present

## 2023-01-11 DIAGNOSIS — Z87891 Personal history of nicotine dependence: Secondary | ICD-10-CM | POA: Diagnosis not present

## 2023-01-11 DIAGNOSIS — C799 Secondary malignant neoplasm of unspecified site: Secondary | ICD-10-CM | POA: Diagnosis not present

## 2023-01-13 DIAGNOSIS — I731 Thromboangiitis obliterans [Buerger's disease]: Secondary | ICD-10-CM | POA: Insufficient documentation

## 2023-01-13 DIAGNOSIS — C801 Malignant (primary) neoplasm, unspecified: Secondary | ICD-10-CM | POA: Diagnosis not present

## 2023-01-13 DIAGNOSIS — M79645 Pain in left finger(s): Secondary | ICD-10-CM | POA: Diagnosis not present

## 2023-01-13 DIAGNOSIS — M79644 Pain in right finger(s): Secondary | ICD-10-CM | POA: Diagnosis not present

## 2023-01-18 DIAGNOSIS — I731 Thromboangiitis obliterans [Buerger's disease]: Secondary | ICD-10-CM | POA: Diagnosis not present

## 2023-01-18 DIAGNOSIS — Q782 Osteopetrosis: Secondary | ICD-10-CM | POA: Diagnosis not present

## 2023-01-18 DIAGNOSIS — M899 Disorder of bone, unspecified: Secondary | ICD-10-CM | POA: Diagnosis not present

## 2023-01-18 DIAGNOSIS — R590 Localized enlarged lymph nodes: Secondary | ICD-10-CM | POA: Diagnosis not present

## 2023-01-18 DIAGNOSIS — F411 Generalized anxiety disorder: Secondary | ICD-10-CM | POA: Diagnosis not present

## 2023-01-18 DIAGNOSIS — C773 Secondary and unspecified malignant neoplasm of axilla and upper limb lymph nodes: Secondary | ICD-10-CM | POA: Diagnosis not present

## 2023-01-18 DIAGNOSIS — I10 Essential (primary) hypertension: Secondary | ICD-10-CM | POA: Diagnosis not present

## 2023-01-19 DIAGNOSIS — C801 Malignant (primary) neoplasm, unspecified: Secondary | ICD-10-CM | POA: Diagnosis not present

## 2023-01-19 DIAGNOSIS — C773 Secondary and unspecified malignant neoplasm of axilla and upper limb lymph nodes: Secondary | ICD-10-CM | POA: Diagnosis not present

## 2023-01-19 DIAGNOSIS — M8500 Fibrous dysplasia (monostotic), unspecified site: Secondary | ICD-10-CM | POA: Diagnosis not present

## 2023-01-20 DIAGNOSIS — Z79891 Long term (current) use of opiate analgesic: Secondary | ICD-10-CM | POA: Diagnosis not present

## 2023-01-20 DIAGNOSIS — M545 Low back pain, unspecified: Secondary | ICD-10-CM | POA: Diagnosis not present

## 2023-01-20 DIAGNOSIS — I731 Thromboangiitis obliterans [Buerger's disease]: Secondary | ICD-10-CM | POA: Diagnosis not present

## 2023-01-20 DIAGNOSIS — G894 Chronic pain syndrome: Secondary | ICD-10-CM | POA: Diagnosis not present

## 2023-01-20 DIAGNOSIS — M5416 Radiculopathy, lumbar region: Secondary | ICD-10-CM | POA: Diagnosis not present

## 2023-01-20 DIAGNOSIS — M5431 Sciatica, right side: Secondary | ICD-10-CM | POA: Diagnosis not present

## 2023-01-20 DIAGNOSIS — M5412 Radiculopathy, cervical region: Secondary | ICD-10-CM | POA: Diagnosis not present

## 2023-01-20 DIAGNOSIS — M542 Cervicalgia: Secondary | ICD-10-CM | POA: Diagnosis not present

## 2023-01-27 DIAGNOSIS — F411 Generalized anxiety disorder: Secondary | ICD-10-CM | POA: Insufficient documentation

## 2023-01-27 DIAGNOSIS — M5030 Other cervical disc degeneration, unspecified cervical region: Secondary | ICD-10-CM | POA: Insufficient documentation

## 2023-01-27 DIAGNOSIS — M5136 Other intervertebral disc degeneration, lumbar region: Secondary | ICD-10-CM | POA: Insufficient documentation

## 2023-01-27 DIAGNOSIS — F332 Major depressive disorder, recurrent severe without psychotic features: Secondary | ICD-10-CM | POA: Insufficient documentation

## 2023-01-27 DIAGNOSIS — Z79891 Long term (current) use of opiate analgesic: Secondary | ICD-10-CM | POA: Insufficient documentation

## 2023-02-02 DIAGNOSIS — R0602 Shortness of breath: Secondary | ICD-10-CM | POA: Diagnosis not present

## 2023-02-02 DIAGNOSIS — C169 Malignant neoplasm of stomach, unspecified: Secondary | ICD-10-CM | POA: Diagnosis not present

## 2023-02-02 DIAGNOSIS — M545 Low back pain, unspecified: Secondary | ICD-10-CM | POA: Diagnosis not present

## 2023-02-02 DIAGNOSIS — R079 Chest pain, unspecified: Secondary | ICD-10-CM | POA: Diagnosis not present

## 2023-02-03 DIAGNOSIS — G893 Neoplasm related pain (acute) (chronic): Secondary | ICD-10-CM | POA: Diagnosis not present

## 2023-02-03 DIAGNOSIS — K5903 Drug induced constipation: Secondary | ICD-10-CM | POA: Diagnosis not present

## 2023-02-03 DIAGNOSIS — E871 Hypo-osmolality and hyponatremia: Secondary | ICD-10-CM | POA: Diagnosis not present

## 2023-02-03 DIAGNOSIS — C7951 Secondary malignant neoplasm of bone: Secondary | ICD-10-CM | POA: Diagnosis not present

## 2023-02-03 DIAGNOSIS — T40605A Adverse effect of unspecified narcotics, initial encounter: Secondary | ICD-10-CM | POA: Diagnosis not present

## 2023-02-03 DIAGNOSIS — R0602 Shortness of breath: Secondary | ICD-10-CM | POA: Diagnosis not present

## 2023-02-03 DIAGNOSIS — M549 Dorsalgia, unspecified: Secondary | ICD-10-CM | POA: Diagnosis not present

## 2023-02-03 DIAGNOSIS — K5900 Constipation, unspecified: Secondary | ICD-10-CM | POA: Diagnosis not present

## 2023-02-03 DIAGNOSIS — R52 Pain, unspecified: Secondary | ICD-10-CM | POA: Diagnosis not present

## 2023-02-03 DIAGNOSIS — I10 Essential (primary) hypertension: Secondary | ICD-10-CM | POA: Diagnosis not present

## 2023-02-03 DIAGNOSIS — C169 Malignant neoplasm of stomach, unspecified: Secondary | ICD-10-CM | POA: Diagnosis not present

## 2023-02-03 DIAGNOSIS — C773 Secondary and unspecified malignant neoplasm of axilla and upper limb lymph nodes: Secondary | ICD-10-CM | POA: Diagnosis not present

## 2023-02-03 DIAGNOSIS — Z5989 Other problems related to housing and economic circumstances: Secondary | ICD-10-CM | POA: Insufficient documentation

## 2023-02-03 DIAGNOSIS — C768 Malignant neoplasm of other specified ill-defined sites: Secondary | ICD-10-CM | POA: Diagnosis not present

## 2023-02-03 DIAGNOSIS — E785 Hyperlipidemia, unspecified: Secondary | ICD-10-CM | POA: Diagnosis not present

## 2023-02-03 DIAGNOSIS — R109 Unspecified abdominal pain: Secondary | ICD-10-CM | POA: Diagnosis not present

## 2023-02-03 DIAGNOSIS — N838 Other noninflammatory disorders of ovary, fallopian tube and broad ligament: Secondary | ICD-10-CM | POA: Diagnosis not present

## 2023-02-03 DIAGNOSIS — I731 Thromboangiitis obliterans [Buerger's disease]: Secondary | ICD-10-CM | POA: Diagnosis not present

## 2023-02-03 DIAGNOSIS — M545 Low back pain, unspecified: Secondary | ICD-10-CM | POA: Diagnosis not present

## 2023-02-03 DIAGNOSIS — C801 Malignant (primary) neoplasm, unspecified: Secondary | ICD-10-CM | POA: Diagnosis not present

## 2023-02-03 DIAGNOSIS — F419 Anxiety disorder, unspecified: Secondary | ICD-10-CM | POA: Diagnosis not present

## 2023-02-03 DIAGNOSIS — Z515 Encounter for palliative care: Secondary | ICD-10-CM | POA: Diagnosis not present

## 2023-02-03 DIAGNOSIS — R1909 Other intra-abdominal and pelvic swelling, mass and lump: Secondary | ICD-10-CM | POA: Diagnosis not present

## 2023-02-03 DIAGNOSIS — R079 Chest pain, unspecified: Secondary | ICD-10-CM | POA: Diagnosis not present

## 2023-02-03 DIAGNOSIS — Z20822 Contact with and (suspected) exposure to covid-19: Secondary | ICD-10-CM | POA: Diagnosis not present

## 2023-02-04 DIAGNOSIS — C801 Malignant (primary) neoplasm, unspecified: Secondary | ICD-10-CM | POA: Insufficient documentation

## 2023-02-04 DIAGNOSIS — Z515 Encounter for palliative care: Secondary | ICD-10-CM | POA: Insufficient documentation

## 2023-02-04 DIAGNOSIS — R1030 Lower abdominal pain, unspecified: Secondary | ICD-10-CM | POA: Insufficient documentation

## 2023-02-04 DIAGNOSIS — N838 Other noninflammatory disorders of ovary, fallopian tube and broad ligament: Secondary | ICD-10-CM | POA: Insufficient documentation

## 2023-02-04 DIAGNOSIS — R0602 Shortness of breath: Secondary | ICD-10-CM | POA: Insufficient documentation

## 2023-02-04 DIAGNOSIS — F419 Anxiety disorder, unspecified: Secondary | ICD-10-CM | POA: Insufficient documentation

## 2023-02-04 DIAGNOSIS — M549 Dorsalgia, unspecified: Secondary | ICD-10-CM | POA: Insufficient documentation

## 2023-02-07 DIAGNOSIS — G893 Neoplasm related pain (acute) (chronic): Secondary | ICD-10-CM | POA: Insufficient documentation

## 2023-02-07 DIAGNOSIS — K5900 Constipation, unspecified: Secondary | ICD-10-CM | POA: Insufficient documentation

## 2023-02-09 DIAGNOSIS — I440 Atrioventricular block, first degree: Secondary | ICD-10-CM | POA: Diagnosis not present

## 2023-02-09 DIAGNOSIS — I252 Old myocardial infarction: Secondary | ICD-10-CM | POA: Diagnosis not present

## 2023-02-09 DIAGNOSIS — I4940 Unspecified premature depolarization: Secondary | ICD-10-CM | POA: Diagnosis not present

## 2023-02-09 DIAGNOSIS — R9431 Abnormal electrocardiogram [ECG] [EKG]: Secondary | ICD-10-CM | POA: Diagnosis not present

## 2023-02-10 ENCOUNTER — Inpatient Hospital Stay
Admission: RE | Admit: 2023-02-10 | Discharge: 2023-02-10 | Disposition: A | Discharge status: Home / Self Care | Payer: Self-pay | Source: Ambulatory Visit | Attending: Oncology | Admitting: Oncology

## 2023-02-10 ENCOUNTER — Other Ambulatory Visit: Payer: Self-pay

## 2023-02-10 DIAGNOSIS — C801 Malignant (primary) neoplasm, unspecified: Secondary | ICD-10-CM

## 2023-02-14 ENCOUNTER — Encounter: Payer: Self-pay | Admitting: Oncology

## 2023-02-14 ENCOUNTER — Encounter: Payer: Self-pay | Admitting: Student

## 2023-02-14 ENCOUNTER — Inpatient Hospital Stay: Payer: 59 | Attending: Oncology | Admitting: Oncology

## 2023-02-14 ENCOUNTER — Telehealth: Payer: Self-pay | Admitting: Oncology

## 2023-02-14 ENCOUNTER — Inpatient Hospital Stay (HOSPITAL_BASED_OUTPATIENT_CLINIC_OR_DEPARTMENT_OTHER): Payer: 59 | Admitting: Hospice and Palliative Medicine

## 2023-02-14 ENCOUNTER — Inpatient Hospital Stay: Payer: 59

## 2023-02-14 VITALS — BP 136/88 | HR 72 | Temp 96.4°F | Resp 16 | Ht 66.0 in | Wt 185.3 lb

## 2023-02-14 DIAGNOSIS — Z87891 Personal history of nicotine dependence: Secondary | ICD-10-CM | POA: Diagnosis not present

## 2023-02-14 DIAGNOSIS — M545 Low back pain, unspecified: Secondary | ICD-10-CM | POA: Diagnosis not present

## 2023-02-14 DIAGNOSIS — D239 Other benign neoplasm of skin, unspecified: Secondary | ICD-10-CM | POA: Diagnosis not present

## 2023-02-14 DIAGNOSIS — R970 Elevated carcinoembryonic antigen [CEA]: Secondary | ICD-10-CM | POA: Diagnosis not present

## 2023-02-14 DIAGNOSIS — C7951 Secondary malignant neoplasm of bone: Secondary | ICD-10-CM | POA: Diagnosis not present

## 2023-02-14 DIAGNOSIS — R52 Pain, unspecified: Secondary | ICD-10-CM | POA: Insufficient documentation

## 2023-02-14 DIAGNOSIS — C801 Malignant (primary) neoplasm, unspecified: Secondary | ICD-10-CM | POA: Diagnosis not present

## 2023-02-14 DIAGNOSIS — I731 Thromboangiitis obliterans [Buerger's disease]: Secondary | ICD-10-CM | POA: Diagnosis not present

## 2023-02-14 DIAGNOSIS — R590 Localized enlarged lymph nodes: Secondary | ICD-10-CM | POA: Diagnosis not present

## 2023-02-14 DIAGNOSIS — G8929 Other chronic pain: Secondary | ICD-10-CM | POA: Diagnosis not present

## 2023-02-14 DIAGNOSIS — R971 Elevated cancer antigen 125 [CA 125]: Secondary | ICD-10-CM | POA: Diagnosis not present

## 2023-02-14 DIAGNOSIS — Z09 Encounter for follow-up examination after completed treatment for conditions other than malignant neoplasm: Secondary | ICD-10-CM | POA: Diagnosis not present

## 2023-02-14 NOTE — Progress Notes (Signed)
Mentioned having bilateral under arm pain and what feels like swelling and lower abdominal pain that has occurred within the last couple of weeks.

## 2023-02-14 NOTE — Progress Notes (Signed)
Dr. Marcello Fennel has sent a referral to [facility] for colonoscopy. This appointment was scheduled in August. I have reached out to GI to see if this can be done urgently. Will contact Ms. Sobczak with port appointment once arranged. She did not feel that she needed to see palliative care today and this appointment was cancelled. Following with pain management in [place]. Referral sent to social work for financial assist request.

## 2023-02-14 NOTE — Telephone Encounter (Signed)
PER [place] [facility], Auth req for PET Scan. Will schedule when approved

## 2023-02-14 NOTE — Progress Notes (Signed)
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  INTERVAL HISTORY: Patient is a 47 year old female who was diagnosed with the above-stated malignancy approximately 2 months ago at [facility].  She received a second opinion at [facility] last week.  She presents to clinic today to discuss her diagnostic workup and treatment planning.  She has significant lower back pain, but otherwise feels well.  She has no neurologic complaints.  She denies any recent fevers or illnesses.  She has a good appetite and denies weight loss.  She has no chest pain, shortness of breath, cough, or hemoptysis.  She denies any nausea, vomiting, constipation, or diarrhea.  She has no urinary complaints.  Patient otherwise feels well and offers no further specific complaints today.  REVIEW OF SYSTEMS:   Review of Systems  Constitutional: Negative.  Negative for fever, malaise/fatigue and weight loss.  Respiratory: Negative.  Negative for cough, hemoptysis and shortness of breath.   Cardiovascular: Negative.  Negative for chest pain and leg swelling.  Gastrointestinal: Negative.  Negative for abdominal pain.  Genitourinary: Negative.  Negative for dysuria.  Musculoskeletal:  Positive for back pain.  Skin: Negative.  Negative for rash.  Neurological: Negative.  Negative for dizziness, focal weakness, weakness and headaches.  Psychiatric/Behavioral: Negative.  The patient is not nervous/anxious.     As per HPI. Otherwise, a complete review of systems is negative.  PAST MEDICAL HISTORY: Past Medical History:  Diagnosis  Date   Anxiety    Depression    Hypertension     PAST SURGICAL HISTORY: Past Surgical History:  Procedure Laterality Date   CESAREAN SECTION      FAMILY HISTORY: Family History  Problem Relation Age of Onset   Cancer Maternal Grandmother     ADVANCED DIRECTIVES (Y/N):  N  HEALTH MAINTENANCE: Social History   Tobacco Use   Smoking status: Former    Packs/day: .1    Types: Cigarettes    Quit date: 12/19/2022    Years since quitting: 0.1   Smokeless tobacco: Never   Tobacco comments:    Quit smoking 12/2022  Vaping Use   Vaping Use: Never used  Substance Use Topics   Alcohol use: Yes    Comment: occ   Drug use: No     Colonoscopy:  PAP:  Bone density:  Lipid panel:  Allergies  Allergen Reactions   Nifedipine Palpitations    Current Outpatient Medications  Medication Sig Dispense Refill   acetaminophen (TYLENOL) 500 MG tablet Take 1,000 mg by mouth every 8 (eight) hours as needed.     albuterol (PROVENTIL) (2.5 MG/3ML) 0.083% nebulizer solution Take 2.5 mg by nebulization every 6 (six) hours as needed for wheezing or shortness of breath.     Oxycodone HCl 10 MG TABS Take 10 mg by mouth every 4 (four) hours.     oxyCODONE-acetaminophen (PERCOCET/ROXICET) 5-325 MG tablet Take 1 tablet by mouth 2 (two) times daily.     polyethylene glycol (MIRALAX / GLYCOLAX) 17 g packet Take 17 g by mouth daily.     tiZANidine (ZANAFLEX) 2 MG tablet Take 2 mg by mouth at bedtime.     traMADol (ULTRAM-ER)  100 MG 24 hr tablet Take 100 mg by mouth daily.     cetirizine (ZYRTEC ALLERGY) 10 MG tablet Take 1 tablet (10 mg total) by mouth daily. 30 tablet 0   cilostazol (PLETAL) 100 MG tablet Take 100 mg by mouth 2 (two) times daily. (Patient not taking: Reported on 02/14/2023)     cyanocobalamin (VITAMIN B12) 500 MCG tablet Take 500 mcg by mouth daily. (Patient not taking: Reported on 02/14/2023)     cyclobenzaprine (FLEXERIL) 10 MG tablet Take 10 mg by mouth 3 (three) times daily as  needed for muscle spasms. (Patient not taking: Reported on 02/14/2023)     DULoxetine (CYMBALTA) 30 MG capsule Take 30 mg by mouth daily. (Patient not taking: Reported on 02/14/2023)     gabapentin (NEURONTIN) 300 MG capsule Take 300 mg by mouth 3 (three) times daily. (Patient not taking: Reported on 02/14/2023)     Multiple Vitamin (MULTIVITAMIN) tablet Take 1 tablet by mouth daily. (Patient not taking: Reported on 02/14/2023)     naloxone [facility]) nasal spray 4 mg/0.1 mL Place 1 spray into the nose once. (Patient not taking: Reported on 02/14/2023)     NIFEdipine (ADALAT CC) 30 MG 24 hr tablet Take 30 mg by mouth daily. (Patient not taking: Reported on 02/14/2023)     omeprazole (PRILOSEC) 20 MG capsule Take 20 mg by mouth daily. (Patient not taking: Reported on 02/14/2023)     ondansetron (ZOFRAN-ODT) 4 MG disintegrating tablet Take 4 mg by mouth 2 (two) times daily as needed for nausea or vomiting. (Patient not taking: Reported on 02/14/2023)     potassium chloride (KLOR-CON) 10 MEQ tablet Take 10 mEq by mouth daily. (Patient not taking: Reported on 02/14/2023)     Vitamin D, Ergocalciferol, (DRISDOL) 1.25 MG (50000 UNIT) CAPS capsule Take 50,000 Units by mouth every 7 (seven) days. (Patient not taking: Reported on 02/14/2023)     No current facility-administered medications for this visit.    OBJECTIVE: Vitals:   02/14/23 1137  BP: 136/88  Pulse: 72  Resp: 16  Temp: (!) 96.4 F (35.8 C)  SpO2: 100%     Body mass index is 29.91 kg/m.    ECOG FS:1 - Symptomatic but completely ambulatory  General: Well-developed, well-nourished, no acute distress.  Sitting in a wheelchair. Eyes: Pink conjunctiva, anicteric sclera. HEENT: Normocephalic, moist mucous membranes. Lungs: No audible wheezing or coughing. Heart: Regular rate and rhythm. Abdomen: Soft, nontender, no obvious distention. Musculoskeletal: No edema, cyanosis, or clubbing. Neuro: Alert, answering all questions appropriately. Cranial  nerves grossly intact. Skin: No rashes or petechiae noted. Psych: Normal affect. Lymphatics: No cervical, calvicular, axillary or inguinal LAD.   LAB RESULTS:  Lab Results  Component Value Date   NA 137 09/27/2018   K 4.3 09/27/2018   CL 104 09/27/2018   CO2 23 09/27/2018   GLUCOSE 94 09/27/2018   BUN 15 09/27/2018   CREATININE 0.95 09/27/2018   CALCIUM 9.2 09/27/2018   PROT 7.2 09/27/2018   ALBUMIN 4.0 09/27/2018   AST 24 09/27/2018   ALT 22 09/27/2018   ALKPHOS 46 09/27/2018   BILITOT 0.4 09/27/2018   GFRNONAA >60 09/27/2018   GFRAA >60 09/27/2018    Lab Results  Component Value Date   WBC 7.2 09/27/2018   NEUTROABS 2.9 08/12/2017   HGB 15.1 (H) 09/27/2018   HCT 44.5 09/27/2018   MCV 90.3 09/27/2018   PLT 270 09/27/2018     STUDIES: No results found.  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  Subsequent biopsy of a right axillary lymph node revealed the above-stated adenocarcinoma.  By report patient also had a type tumor mutational burden.  Additional workup at [facility] in April 2024 revealed a negative EGD, and negative CT scanning of the brain.  CT scan of the abdomen and follow-up pelvic ultrasound revealed a large left adnexal mass that was not commented on PET scan from February.  CA 19-9 is normal, but CA125 and CEA are mildly elevated.  Patient will likely require systemic chemotherapy either with infusional 5-FU or FOLFOX plus immunotherapy.  Patient will also require port placement.  Will repeat PET scan in the next 1 to 2 weeks to assess for interval change.  Return to clinic 2 to 3 days after PET scan to discuss the  results and treatment planning. Adnexal mass: PET scan as above.  If hypermetabolic, will recommend biopsy to assess whether this is the primary.  If negative, will consider a benign cyst and proceed with GI based regimen as above. Pain: Patient reports that she is seen in the pain clinic in [place], [place].  If PET scan positive in her lower back, will give a referral to radiation oncology.  I spent a total of 60 minutes reviewing chart data, face-to-face evaluation with the patient, counseling and coordination of care as detailed above.   Patient expressed understanding and was in agreement with this plan. She also understands that She can call clinic at any time with any questions, concerns, or complaints.    Cancer Staging  Signet ring cell adenocarcinoma Staging form: Exocrine Pancreas, AJCC 8th Edition - Clinical stage from 02/07/2023: Stage IV (cTX, cNX, pM1) - Signed by Jeralyn Ruths, MD on 02/14/2023 Stage prefix: Initial diagnosis   Jeralyn Ruths, MD   02/14/2023 4:06 PM

## 2023-02-14 NOTE — Progress Notes (Signed)
Patient here for second opinion to see Dr. Orlie Dakin.  Palliative care consult canceled per request of Dr. Orlie Dakin. Patient not seen.

## 2023-02-15 ENCOUNTER — Other Ambulatory Visit: Payer: Self-pay | Admitting: Student

## 2023-02-15 ENCOUNTER — Inpatient Hospital Stay: Payer: 59 | Admitting: Licensed Clinical Social Worker

## 2023-02-15 DIAGNOSIS — C801 Malignant (primary) neoplasm, unspecified: Secondary | ICD-10-CM

## 2023-02-15 NOTE — Progress Notes (Signed)
[facility] Clinical Social Work  Initial Assessment   Sheryl Silva is a 47 y.o. year old female contacted by phone. Clinical Social Work was referred by medical provider for assessment of psychosocial needs.   SDOH (Social Determinants of Health) assessments performed: Yes SDOH Interventions    Flowsheet Row Clinical Support from 02/15/2023 in [facility] at [facility] Visit from 02/14/2023 in [facility]  SDOH Interventions    Housing Interventions -- Intervention Not Indicated  Utilities Interventions -- Intervention Not Indicated  Alcohol Usage Interventions Intervention Not Indicated (Score <7) --  Financial Strain Interventions Other (Comment)  [employer] referral] --  Physical Activity Interventions Intervention Not Indicated --  Stress Interventions Intervention Not Indicated --  Social Connections Interventions Intervention Not Indicated --       SDOH Screenings   Food Insecurity: Food Insecurity Present (02/14/2023)  Housing: Low Risk  (02/14/2023)  Transportation Needs: Unmet Transportation Needs (02/14/2023)  Utilities: Not At Risk (02/14/2023)  Alcohol Screen: Low Risk  (02/15/2023)  Depression (PHQ2-9): Low Risk  (02/14/2023)  Financial Resource Strain: Medium Risk (02/15/2023)  Physical Activity: Inactive (02/15/2023)  Social Connections: Socially Isolated (02/15/2023)  Stress: Stress Concern Present (02/15/2023)  Tobacco Use: Medium Risk (02/14/2023)     Distress Screen completed: Yes     No data to display            Family/Social Information:  Housing Arrangement: Patient lives alone, main contact is daughter Sheryl Silva [phone]  Family members/support persons in your life? Family, Friends, and Ambulance person concerns: yes, patient concern with cost of fuel, requested fuel card assistance, patient does have Medicaid transportation available  Employment: Unemployed patient stated  she stopped working "some time ago".  Income source: Supported by [employer] and Friends and No income Financial concerns: Yes, due to illness and/or loss of work during treatment Type of concern:  general financial concerns , food insecurity Food access concerns: yes Religious or spiritual practice: Not known Services Currently in place:  [employer] [facility], [place] MEDICAID [employer] - Patient is planning to move back to [place] to be closer to family.  Patient is currently working on the move but does not have a specific date.  Coping/ Adjustment to diagnosis: Patient understands treatment plan and what happens next? yes Concerns about diagnosis and/or treatment: Pain or discomfort during procedures, Losing my job and/or losing income, Overwhelmed by information, Afraid of cancer, How will I care for myself, and Quality of life Patient reported stressors: Housing, Actuary, English as a second language teacher, Arts administrator, Anxiety/ nervousness, Adjusting to my illness, and Physical issues Hopes and/or priorities: to move to [place] Patient enjoys  N/A Current coping skills/ strengths: Average or above average intelligence , Capable of independent living , Manufacturing systems engineer , Motivation for treatment/growth , Supportive family/friends , and Work skills     SUMMARY: Current SDOH Barriers:  Financial constraints related to no current income, Limited social support, Transportation, and Limited access to food  Clinical Social Work Clinical Goal(s):  No clinical social work goals at this time  Interventions: Discussed common feeling and emotions when being diagnosed with cancer, and the importance of support during treatment Informed patient of the support team roles and support services at [facility] Provided CSW contact information and encouraged patient to call with any questions or concerns Referred patient to [employer] for financial assistance, food pantry assistance,  and Provided patient with information  about CSW role in patient care and other available resources.  CSW will email calendar of events/groups to [id number]   Follow Up Plan: Patient will contact CSW with any support or resource needs Patient verbalizes understanding of plan: Yes    Joseph Art, LCSW   Patient is participating in a Managed Medicaid Plan:  Yes

## 2023-02-16 ENCOUNTER — Other Ambulatory Visit: Payer: Self-pay

## 2023-02-16 ENCOUNTER — Ambulatory Visit
Admission: RE | Admit: 2023-02-16 | Discharge: 2023-02-16 | Disposition: A | Discharge status: Home / Self Care | Payer: 59 | Source: Ambulatory Visit | Attending: Oncology | Admitting: Oncology

## 2023-02-16 ENCOUNTER — Encounter: Payer: Self-pay | Admitting: Radiology

## 2023-02-16 ENCOUNTER — Inpatient Hospital Stay (HOSPITAL_BASED_OUTPATIENT_CLINIC_OR_DEPARTMENT_OTHER): Payer: 59 | Admitting: Hospice and Palliative Medicine

## 2023-02-16 DIAGNOSIS — F32A Depression, unspecified: Secondary | ICD-10-CM | POA: Diagnosis not present

## 2023-02-16 DIAGNOSIS — I10 Essential (primary) hypertension: Secondary | ICD-10-CM | POA: Diagnosis not present

## 2023-02-16 DIAGNOSIS — Z452 Encounter for adjustment and management of vascular access device: Secondary | ICD-10-CM | POA: Diagnosis not present

## 2023-02-16 DIAGNOSIS — Z87891 Personal history of nicotine dependence: Secondary | ICD-10-CM | POA: Insufficient documentation

## 2023-02-16 DIAGNOSIS — C801 Malignant (primary) neoplasm, unspecified: Secondary | ICD-10-CM | POA: Diagnosis not present

## 2023-02-16 DIAGNOSIS — F419 Anxiety disorder, unspecified: Secondary | ICD-10-CM | POA: Diagnosis not present

## 2023-02-16 HISTORY — PX: IR IMAGING GUIDED PORT INSERTION: IMG5740

## 2023-02-16 MED ORDER — HEPARIN SOD (PORK) LOCK FLUSH 100 UNIT/ML IV SOLN
500.0000 [IU] | Freq: Once | INTRAVENOUS | Status: AC
  Administered 2023-02-16: 500 [IU]

## 2023-02-16 MED ORDER — MIDAZOLAM HCL 2 MG/2ML IJ SOLN
INTRAMUSCULAR | Status: AC
  Filled 2023-02-16: qty 2

## 2023-02-16 MED ORDER — SODIUM CHLORIDE 0.9 % IV SOLN: INTRAVENOUS | Status: DC

## 2023-02-16 MED ORDER — MIDAZOLAM HCL 2 MG/2ML IJ SOLN
INTRAMUSCULAR | Status: AC | PRN
  Administered 2023-02-16 (×2): 1 mg via INTRAVENOUS

## 2023-02-16 MED ORDER — LIDOCAINE-EPINEPHRINE 1 %-1:100000 IJ SOLN
20.0000 mL | Freq: Once | INTRAMUSCULAR | Status: AC
  Administered 2023-02-16: 20 mL via INTRADERMAL

## 2023-02-16 MED ORDER — LIDOCAINE HCL 1 % IJ SOLN: 10.0000 mL | Freq: Once | INTRAMUSCULAR | Status: DC

## 2023-02-16 MED ORDER — FENTANYL CITRATE (PF) 100 MCG/2ML IJ SOLN
INTRAMUSCULAR | Status: AC
  Filled 2023-02-16: qty 2

## 2023-02-16 MED ORDER — FENTANYL CITRATE (PF) 100 MCG/2ML IJ SOLN
INTRAMUSCULAR | Status: AC | PRN
  Administered 2023-02-16 (×2): 50 ug via INTRAVENOUS
  Administered 2023-02-16: 25 ug via INTRAVENOUS

## 2023-02-16 MED ORDER — HEPARIN SOD (PORK) LOCK FLUSH 100 UNIT/ML IV SOLN
INTRAVENOUS | Status: AC
  Filled 2023-02-16: qty 5

## 2023-02-16 MED ORDER — LIDOCAINE-EPINEPHRINE 1 %-1:100000 IJ SOLN
INTRAMUSCULAR | Status: AC
  Filled 2023-02-16: qty 1

## 2023-02-16 NOTE — Telephone Encounter (Signed)
PET scheduled for 4/25.

## 2023-02-16 NOTE — Progress Notes (Signed)
Spoke with Sheryl Silva, she will be in next week to sign paperwork for [employer], she will also bring in letter of support.

## 2023-02-16 NOTE — Progress Notes (Signed)
Pt eating and drinking and talking on phone

## 2023-02-16 NOTE — Progress Notes (Signed)
Multidisciplinary Oncology Council Documentation  Sheryl Silva was presented by our [facility] on 02/16/2023, which included representatives from:  Palliative Care Dietitian  Physical/Occupational Therapist Nurse Navigator Genetics Speech Therapist Social work Survivorship RN Financial Navigator Research RN   Sheryl Silva currently presents with history of signet ring cancer  We reviewed previous medical and familial history, history of present illness, and recent lab results along with all available histopathologic and imaging studies. The MOC considered available treatment options and made the following recommendations/referrals:  Nutrition  The MOC is a meeting of clinicians from various specialty areas who evaluate and discuss patients for whom a multidisciplinary approach is being considered. Final determinations in the plan of care are those of the provider(s).   Today's extended care, comprehensive team conference, Sheryl Silva was not present for the discussion and was not examined.

## 2023-02-16 NOTE — Procedures (Signed)
Interventional Radiology Procedure Note  Date of Procedure: 02/16/2023  Procedure: Port placement   Findings:  1. Right chest port placement    Complications: No immediate complications noted.   Estimated Blood Loss: minimal  Follow-up and Recommendations: 1. Ready for use    Olive Bass, MD  Vascular & Interventional Radiology  02/16/2023 3:13 PM

## 2023-02-16 NOTE — H&P (Signed)
Chief Complaint: Patient was seen in consultation today for metastatic adenocarcinoma; port-a-catheter placement.    Referring Physician(s): Finnegan,Timothy J  Supervising Physician: Pernell Dupre  Patient Status: [facility] - Out-pt  History of Present Illness: Sheryl Silva is a 47 y.o. 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 She was diagnosed approximately 2 months ago at [facility] and received a second opinion at [facility] last week. The cancer was discovered during work up for significant lower back pain. Her oncology team is preparing her for systemic therapy and she will require durable venous access.   Interventional Radiology has been asked to evaluate this patient for an image-guided port-a-catheter placement to facilitate her treatment goals.   Past Medical History:  Diagnosis Date   Anxiety    Depression    Hypertension     Past Surgical History:  Procedure Laterality Date   CESAREAN SECTION      Allergies: Nifedipine  Medications: Prior to Admission medications   Medication Sig Start Date End Date Taking? Authorizing Provider  acetaminophen (TYLENOL) 500 MG tablet Take 1,000 mg by mouth every 8 (eight) hours as needed. 02/09/23 02/19/23  [provider]  albuterol (PROVENTIL) (2.5 MG/3ML) 0.083% nebulizer solution Take 2.5 mg by nebulization every 6 (six) hours as needed for wheezing or shortness of breath.    [provider]  cetirizine (ZYRTEC ALLERGY) 10 MG tablet Take 1 tablet (10 mg total) by mouth daily. 10/19/20 12/10/22  Orvil Feil, PA-C  cilostazol (PLETAL) 100 MG tablet Take 100 mg by mouth 2 (two) times daily. Patient not taking: Reported on 02/14/2023 01/24/23 04/24/23  [provider]  cyanocobalamin (VITAMIN B12) 500 MCG tablet Take 500 mcg by mouth daily. Patient not taking: Reported on 02/14/2023    [provider]  cyclobenzaprine (FLEXERIL) 10 MG tablet Take 10 mg by mouth 3 (three) times daily as needed for muscle spasms. Patient not taking: Reported on 02/14/2023    [provider]  DULoxetine (CYMBALTA) 30 MG capsule Take 30 mg by mouth daily. Patient not taking: Reported on 02/14/2023    [provider]  gabapentin (NEURONTIN) 300 MG capsule Take 300 mg by mouth 3 (three) times daily. Patient not taking: Reported on 02/14/2023    [provider]  Multiple Vitamin (MULTIVITAMIN) tablet Take 1 tablet by mouth daily. Patient not taking: Reported on 02/14/2023    [provider]  naloxone [facility]) nasal spray 4 mg/0.1 mL Place 1 spray into the nose once. Patient not taking: Reported on 02/14/2023 02/09/23   [provider]  NIFEdipine (ADALAT CC) 30 MG 24 hr tablet Take 30 mg by mouth daily. Patient not taking: Reported on 02/14/2023 12/01/22 12/01/23  [provider]  omeprazole (PRILOSEC) 20 MG capsule Take 20 mg by mouth daily. Patient not taking: Reported on 02/14/2023    [provider]  ondansetron (ZOFRAN-ODT) 4 MG disintegrating tablet Take 4 mg by mouth 2 (two) times daily as needed for nausea or vomiting. Patient not taking: Reported on 02/14/2023 01/26/23   [provider]  Oxycodone HCl 10 MG TABS Take 10 mg by mouth every 4 (four) hours. 02/09/23 02/23/23  [provider]  oxyCODONE-acetaminophen (PERCOCET/ROXICET) 5-325 MG tablet Take 1 tablet by mouth 2 (two) times daily. 09/14/22   [provider]  polyethylene glycol (MIRALAX / GLYCOLAX) 17 g packet Take 17 g by mouth daily. 02/09/23   [provider]  potassium chloride (KLOR-CON) 10 MEQ tablet Take 10 mEq by mouth daily. Patient not taking: Reported on 02/14/2023    [provider]  tiZANidine (ZANAFLEX) 2 MG tablet Take 2 mg by mouth at bedtime. 01/20/23   [provider]  traMADol (ULTRAM-ER) 100 MG 24 hr tablet Take  100 mg by mouth daily. 01/26/23   [provider]  Vitamin D, Ergocalciferol, (DRISDOL) 1.25 MG (50000 UNIT) CAPS capsule Take 50,000 Units by mouth every 7 (seven) days. Patient not taking: Reported on 02/14/2023 01/30/23   [provider]     Family History  Problem Relation Age of Onset   Cancer Maternal Grandmother     Social History   Socioeconomic History   Marital status: Divorced    Spouse name: Not on file   Number of children: Not on file   Years of education: Not on file   Highest education level: Not on file  Occupational History   Not on file  Tobacco Use   Smoking status: Former    Packs/day: .1    Types: Cigarettes    Quit date: 12/19/2022    Years since quitting: 0.1   Smokeless tobacco: Never   Tobacco comments:    Quit smoking 12/2022  Vaping Use   Vaping Use: Never used  Substance and Sexual Activity   Alcohol use: Yes    Comment: occ   Drug use: No   Sexual activity: Not on file  Other Topics Concern   Not on file  Social History Narrative   Not on file   Social Determinants of Health   Financial Resource Strain: Medium Risk (02/15/2023)   Overall Financial Resource Strain (CARDIA)    Difficulty of Paying Living Expenses: Somewhat hard  Food Insecurity: Food Insecurity Present (02/14/2023)   Hunger Vital Sign    Worried About Running Out of Food in the Last Year: Sometimes true    Ran Out of Food in the Last Year: Sometimes true  Transportation Needs: Unmet Transportation Needs (02/14/2023)   PRAPARE - Transportation    Lack of Transportation (Medical): Yes    Lack of Transportation (Non-Medical): Yes  Physical Activity: Inactive (02/15/2023)   Exercise Vital Sign    Days of Exercise per Week: 0 days    Minutes of Exercise per Session: 0 min  Stress: Stress Concern Present (02/15/2023)   [employer] of Occupational Health - Occupational Stress Questionnaire    Feeling of Stress : To some extent  Social Connections:  Socially Isolated (02/15/2023)   Social Connection and Isolation Panel [NHANES]    Frequency of Communication with Friends and Family: More than three times a week    Frequency of Social Gatherings with Friends and Family: Once a week    Attends Religious Services: Never    Database administrator or Organizations: No    Attends Banker Meetings: Never    Marital Status: Divorced    Review of Systems: A 12 point ROS discussed and pertinent positives are indicated in the HPI above.  All other systems are negative.  Review of Systems  Constitutional:  Negative for appetite change and fatigue.  Respiratory:  Negative for cough and shortness of breath.   Cardiovascular:  Negative for chest pain and leg swelling.  Gastrointestinal:  Negative for abdominal pain, diarrhea, nausea and vomiting.  Musculoskeletal:  Positive for arthralgias, back pain, joint swelling and myalgias.  Neurological:  Negative for dizziness and headaches.    Vital Signs: BP 130/81  Pulse 69   Temp 98 F (36.7 C) (Oral)   Resp 17   Ht 5\' 6"  (1.676 m)   Wt 187 lb 6.3 oz (85 kg)   SpO2 100%   BMI 30.25 kg/m   Physical Exam Constitutional:      General: She is not in acute distress.    Appearance: She is not ill-appearing.  HENT:     Mouth/Throat:     Mouth: Mucous membranes are moist.     Pharynx: Oropharynx is clear.  Cardiovascular:     Rate and Rhythm: Normal rate and regular rhythm.     Pulses: Normal pulses.  Pulmonary:     Effort: Pulmonary effort is normal.  Abdominal:     Palpations: Abdomen is soft.     Tenderness: There is no abdominal tenderness.  Musculoskeletal:     Right lower leg: No edema.     Left lower leg: No edema.  Skin:    General: Skin is warm and dry.  Neurological:     Mental Status: She is alert and oriented to person, place, and time.  Psychiatric:        Mood and Affect: Mood normal.        Behavior: Behavior normal.        Thought Content: Thought  content normal.        Judgment: Judgment normal.     Imaging: CT OUTSIDE FILMS CHEST  Result Date: 02/15/2023 This examination belongs to an outside facility and is stored here for comparison purposes only.  Contact the originating outside institution for any associated report or interpretation.   Labs:  CBC: No results for input(s): "WBC", "HGB", "HCT", "PLT" in the last 8760 hours.  COAGS: No results for input(s): "INR", "APTT" in the last 8760 hours.  BMP: No results for input(s): "NA", "K", "CL", "CO2", "GLUCOSE", "BUN", "CALCIUM", "CREATININE", "GFRNONAA", "GFRAA" in the last 8760 hours.  Invalid input(s): "CMP"  LIVER FUNCTION TESTS: No results for input(s): "BILITOT", "AST", "ALT", "ALKPHOS", "PROT", "ALBUMIN" in the last 8760 hours.  TUMOR MARKERS: No results for input(s): "AFPTM", "CEA", "CA199", "CHROMGRNA" in the last 8760 hours.  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 Interventional Radiology department for an image-guided port-a-catheter placement.  Risks and benefits of image-guided port-a-catheter placement were discussed with the patient including, but not limited to bleeding, infection, pneumothorax, or fibrin sheath development and need for additional procedures.  All of the patient's questions were answered, patient is agreeable to proceed. She has been NPO. She is a full code.   Consent signed and in chart.  Thank you for this interesting consult.  I greatly enjoyed meeting Sheryl Silva and look forward to participating in their care.  A copy of this report was sent to the requesting provider on this date.  Electronically Signed: Alwyn Ren, AGACNP-BC [phone] 02/16/2023, 1:26 PM   I spent a total of  30 Minutes   in face to face in clinical consultation, greater than 50% of which was  counseling/coordinating care for port-a-catheter placement.

## 2023-02-17 DIAGNOSIS — M5431 Sciatica, right side: Secondary | ICD-10-CM | POA: Diagnosis not present

## 2023-02-17 DIAGNOSIS — Z79891 Long term (current) use of opiate analgesic: Secondary | ICD-10-CM | POA: Diagnosis not present

## 2023-02-17 DIAGNOSIS — M5416 Radiculopathy, lumbar region: Secondary | ICD-10-CM | POA: Diagnosis not present

## 2023-02-17 DIAGNOSIS — M542 Cervicalgia: Secondary | ICD-10-CM | POA: Diagnosis not present

## 2023-02-17 DIAGNOSIS — G894 Chronic pain syndrome: Secondary | ICD-10-CM | POA: Diagnosis not present

## 2023-02-17 DIAGNOSIS — M5412 Radiculopathy, cervical region: Secondary | ICD-10-CM | POA: Diagnosis not present

## 2023-02-17 DIAGNOSIS — M545 Low back pain, unspecified: Secondary | ICD-10-CM | POA: Diagnosis not present

## 2023-02-17 DIAGNOSIS — I731 Thromboangiitis obliterans [Buerger's disease]: Secondary | ICD-10-CM | POA: Diagnosis not present

## 2023-02-18 ENCOUNTER — Encounter: Payer: Self-pay | Admitting: *Deleted

## 2023-02-21 ENCOUNTER — Ambulatory Visit
Admission: RE | Admit: 2023-02-21 | Discharge: 2023-02-21 | Disposition: A | Discharge status: Home / Self Care | Payer: 59 | Attending: Gastroenterology | Admitting: Gastroenterology

## 2023-02-21 ENCOUNTER — Ambulatory Visit: Payer: 59 | Admitting: General Practice

## 2023-02-21 ENCOUNTER — Encounter: Payer: Self-pay | Admitting: *Deleted

## 2023-02-21 ENCOUNTER — Telehealth: Payer: Self-pay | Admitting: Oncology

## 2023-02-21 ENCOUNTER — Encounter
Admission: RE | Disposition: A | Discharge status: Home / Self Care | Payer: Self-pay | Source: Home / Self Care | Attending: Gastroenterology

## 2023-02-21 DIAGNOSIS — I10 Essential (primary) hypertension: Secondary | ICD-10-CM | POA: Insufficient documentation

## 2023-02-21 DIAGNOSIS — K219 Gastro-esophageal reflux disease without esophagitis: Secondary | ICD-10-CM | POA: Diagnosis not present

## 2023-02-21 DIAGNOSIS — J45909 Unspecified asthma, uncomplicated: Secondary | ICD-10-CM | POA: Diagnosis not present

## 2023-02-21 DIAGNOSIS — Z86010 Personal history of colonic polyps: Secondary | ICD-10-CM | POA: Diagnosis not present

## 2023-02-21 DIAGNOSIS — Z1211 Encounter for screening for malignant neoplasm of colon: Secondary | ICD-10-CM | POA: Insufficient documentation

## 2023-02-21 DIAGNOSIS — I739 Peripheral vascular disease, unspecified: Secondary | ICD-10-CM | POA: Insufficient documentation

## 2023-02-21 DIAGNOSIS — Z800 Family history of malignant neoplasm of digestive organs: Secondary | ICD-10-CM | POA: Diagnosis not present

## 2023-02-21 DIAGNOSIS — C801 Malignant (primary) neoplasm, unspecified: Secondary | ICD-10-CM | POA: Insufficient documentation

## 2023-02-21 DIAGNOSIS — Z79899 Other long term (current) drug therapy: Secondary | ICD-10-CM | POA: Diagnosis not present

## 2023-02-21 DIAGNOSIS — Z87891 Personal history of nicotine dependence: Secondary | ICD-10-CM | POA: Insufficient documentation

## 2023-02-21 HISTORY — PX: COLONOSCOPY WITH PROPOFOL: SHX5780

## 2023-02-21 HISTORY — DX: Cardiac murmur, unspecified: R01.1

## 2023-02-21 HISTORY — DX: Malignant (primary) neoplasm, unspecified: C80.1

## 2023-02-21 LAB — POCT PREGNANCY, URINE: Preg Test, Ur: POSITIVE — AB

## 2023-02-21 SURGERY — COLONOSCOPY WITH PROPOFOL
Anesthesia: General

## 2023-02-21 MED ORDER — PROPOFOL 10 MG/ML IV BOLUS
INTRAVENOUS | Status: DC | PRN
  Administered 2023-02-21: 150 ug/kg/min via INTRAVENOUS
  Administered 2023-02-21: 100 mg via INTRAVENOUS

## 2023-02-21 MED ORDER — SODIUM CHLORIDE 0.9 % IV SOLN: INTRAVENOUS | Status: DC

## 2023-02-21 MED ORDER — PHENYLEPHRINE HCL (PRESSORS) 10 MG/ML IV SOLN
INTRAVENOUS | Status: DC | PRN
  Administered 2023-02-21: 80 ug via INTRAVENOUS

## 2023-02-21 MED ORDER — PROPOFOL 10 MG/ML IV BOLUS
INTRAVENOUS | Status: AC
  Filled 2023-02-21: qty 40

## 2023-02-21 MED ORDER — STERILE WATER FOR IRRIGATION IR SOLN
Status: DC | PRN
  Administered 2023-02-21: 200 mL

## 2023-02-21 NOTE — Transfer of Care (Signed)
Immediate Anesthesia Transfer of Care Note  Patient: Sheryl Silva  Procedure(s) Performed: COLONOSCOPY WITH PROPOFOL  Patient Location: Endoscopy Unit  Anesthesia Type:General  Level of Consciousness: drowsy  Airway & Oxygen Therapy: Patient Spontanous Breathing  Post-op Assessment: Report given to RN and Post -op Vital signs reviewed and stable  Post vital signs: Reviewed and stable  Last Vitals:  Vitals Value Taken Time  BP 101/77 0823  Temp 35.9 0823  Pulse 74 0823  Resp 15 02/21/23 0823  SpO2 98 0823  Vitals shown include unvalidated device data.  Last Pain:  Vitals:   02/21/23 0720  TempSrc: Temporal  PainSc: 8          Complications: No notable events documented.

## 2023-02-21 NOTE — H&P (Signed)
Outpatient short stay form Pre-procedure 02/21/2023  Sheryl Bill, MD  Primary Physician: [name], MD  Reason for visit:  Adenocarcinoma of unknown origin  History of present illness:    47 y/o lady with adenocarcinoma of unknown primary here for colonoscopy. Has never had colonoscopy before. No blood thinners. Had a grandparent with esophageal cancer. History of a c-section.    Current Facility-Administered Medications:    0.9 %  sodium chloride infusion, , Intravenous, Continuous, Daimien Patmon, Rossie Muskrat, MD, Last Rate: 20 mL/hr at 02/21/23 0744, New Bag at 02/21/23 0744  Medications Prior to Admission  Medication Sig Dispense Refill Last Dose   albuterol (PROVENTIL) (2.5 MG/3ML) 0.083% nebulizer solution Take 2.5 mg by nebulization every 6 (six) hours as needed for wheezing or shortness of breath.      cetirizine (ZYRTEC ALLERGY) 10 MG tablet Take 1 tablet (10 mg total) by mouth daily. 30 tablet 0    cilostazol (PLETAL) 100 MG tablet Take 100 mg by mouth 2 (two) times daily. (Patient not taking: Reported on 02/14/2023)      cyanocobalamin (VITAMIN B12) 500 MCG tablet Take 500 mcg by mouth daily.      cyclobenzaprine (FLEXERIL) 10 MG tablet Take 10 mg by mouth 3 (three) times daily as needed for muscle spasms. (Patient not taking: Reported on 02/14/2023)      DULoxetine (CYMBALTA) 30 MG capsule Take 30 mg by mouth daily. (Patient not taking: Reported on 02/14/2023)      gabapentin (NEURONTIN) 300 MG capsule Take 300 mg by mouth 3 (three) times daily.      Multiple Vitamin (MULTIVITAMIN) tablet Take 1 tablet by mouth daily.      naloxone (NARCAN) nasal spray 4 mg/0.1 mL Place 1 spray into the nose once. (Patient not taking: Reported on 02/14/2023)      NIFEdipine (ADALAT CC) 30 MG 24 hr tablet Take 30 mg by mouth daily. (Patient not taking: Reported on 02/14/2023)      omeprazole (PRILOSEC) 20 MG capsule Take 20 mg by mouth daily. (Patient not taking: Reported on 02/14/2023)       ondansetron (ZOFRAN-ODT) 4 MG disintegrating tablet Take 4 mg by mouth 2 (two) times daily as needed for nausea or vomiting. (Patient not taking: Reported on 02/14/2023)      Oxycodone HCl 10 MG TABS Take 10 mg by mouth every 4 (four) hours.   4   oxyCODONE-acetaminophen (PERCOCET/ROXICET) 5-325 MG tablet Take 1 tablet by mouth 2 (two) times daily.      polyethylene glycol (MIRALAX / GLYCOLAX) 17 g packet Take 17 g by mouth daily.      potassium chloride (KLOR-CON) 10 MEQ tablet Take 10 mEq by mouth daily. (Patient not taking: Reported on 02/14/2023)      tiZANidine (ZANAFLEX) 2 MG tablet Take 2 mg by mouth at bedtime.   02/19/2023   traMADol (ULTRAM-ER) 100 MG 24 hr tablet Take 100 mg by mouth daily.      Vitamin D, Ergocalciferol, (DRISDOL) 1.25 MG (50000 UNIT) CAPS capsule Take 50,000 Units by mouth every 7 (seven) days.        Allergies  Allergen Reactions   Nifedipine Palpitations     Past Medical History:  Diagnosis Date   Anxiety    Cancer    Depression    Heart murmur    Hypertension     Review of systems:  Otherwise negative.    Physical Exam  Gen: Alert, oriented. Appears stated age.  HEENT: PERRLA. Lungs: No  respiratory distress CV: RRR Abd: soft, benign, no masses Ext: No edema    Planned procedures: Proceed with colonoscopy. The patient understands the nature of the planned procedure, indications, risks, alternatives and potential complications including but not limited to bleeding, infection, perforation, damage to internal organs and possible oversedation/side effects from anesthesia. The patient agrees and gives consent to proceed.  Please refer to procedure notes for findings, recommendations and patient disposition/instructions.     Sheryl Rubenstein, MD [facility]

## 2023-02-21 NOTE — Telephone Encounter (Signed)
Created in error

## 2023-02-21 NOTE — Op Note (Addendum)
[facility] Gastroenterology Patient Name: Sheryl Silva Procedure Date: 02/21/2023 7:27 AM MRN: [id number] Account #: [id number] Date of Birth: 31-Dec-1975 Admit Type: Outpatient Age: 47 Room: [facility] ENDO ROOM 3 Gender: Female Note Status: Finalized Instrument Name: Prentice Docker [id number] Procedure:             Colonoscopy Indications:           Personal history of malignant neoplasm Providers:             Eather Colas MD, MD Medicines:             Monitored Anesthesia Care Complications:         No immediate complications. Procedure:             Pre-Anesthesia Assessment:                        - Prior to the procedure, a History and Physical was                         performed, and patient medications and allergies were                         reviewed. The patient is competent. The risks and                         benefits of the procedure and the sedation options and                         risks were discussed with the patient. All questions                         were answered and informed consent was obtained.                         Patient identification and proposed procedure were                         verified by the physician, the nurse, the                         anesthesiologist, the anesthetist and the technician                         in the endoscopy suite. Mental Status Examination:                         alert and oriented. Airway Examination: normal                         oropharyngeal airway and neck mobility. Respiratory                         Examination: clear to auscultation. CV Examination:                         normal. Prophylactic Antibiotics: The patient does not                         require prophylactic antibiotics. Prior  Anticoagulants: The patient has taken no anticoagulant                         or antiplatelet agents. ASA Grade Assessment: II - A                         patient with mild  systemic disease. After reviewing                         the risks and benefits, the patient was deemed in                         satisfactory condition to undergo the procedure. The                         anesthesia plan was to use monitored anesthesia care                         (MAC). Immediately prior to administration of                         medications, the patient was re-assessed for adequacy                         to receive sedatives. The heart rate, respiratory                         rate, oxygen saturations, blood pressure, adequacy of                         pulmonary ventilation, and response to care were                         monitored throughout the procedure. The physical                         status of the patient was re-assessed after the                         procedure.                        After obtaining informed consent, the colonoscope was                         passed under direct vision. Throughout the procedure,                         the patient's blood pressure, pulse, and oxygen                         saturations were monitored continuously. The                         Colonoscope was introduced through the anus and                         advanced to the the terminal ileum. The colonoscopy  was performed without difficulty. The patient                         tolerated the procedure well. The quality of the bowel                         preparation was adequate to identify polyps. The                         terminal ileum, ileocecal valve, appendiceal orifice,                         and rectum were photographed. Findings:      The perianal and digital rectal examinations were normal.      The terminal ileum appeared normal.      The entire examined colon appeared normal on direct and retroflexion       views. Impression:            - The examined portion of the ileum was normal.                        - The entire  examined colon is normal on direct and                         retroflexion views.                        - No specimens collected. Recommendation:        - Discharge patient to home.                        - Resume previous diet.                        - Continue present medications.                        - Repeat colonoscopy in 10 years for screening                         purposes.                        - Return to referring physician as previously                         scheduled. Procedure Code(s):     --- Professional ---                        [phone], Colonoscopy, flexible; diagnostic, including                         collection of specimen(s) by brushing or washing, when                         performed (separate procedure) Diagnosis Code(s):     --- Professional ---                        Z85.9, Personal history of malignant neoplasm,  unspecified CPT copyright 2022 American Medical Association. All rights reserved. The codes documented in this report are preliminary and upon coder review may  be revised to meet current compliance requirements. Eather Colas MD, MD 02/21/2023 8:23:34 AM Number of Addenda: 0 Note Initiated On: 02/21/2023 7:27 AM Scope Withdrawal Time: 0 hours 11 minutes 8 seconds  Total Procedure Duration: 0 hours 16 minutes 38 seconds  Estimated Blood Loss:  Estimated blood loss: none.      [facility]

## 2023-02-21 NOTE — Addendum Note (Signed)
Addendum  created 02/21/23 0900 by Morene Crocker, CRNA   Intraprocedure Meds edited

## 2023-02-21 NOTE — Interval H&P Note (Signed)
History and Physical Interval Note:  02/21/2023 7:53 AM  Sheryl Silva  has presented today for surgery, with the diagnosis of adenocarcinoma of unknown primary.  The various methods of treatment have been discussed with the patient and family. After consideration of risks, benefits and other options for treatment, the patient has consented to  Procedure(s): COLONOSCOPY WITH PROPOFOL (N/A) as a surgical intervention.  The patient's history has been reviewed, patient examined, no change in status, stable for surgery.  I have reviewed the patient's chart and labs.  Questions were answered to the patient's satisfaction.     Regis Bill  Ok to proceed with colonoscopy

## 2023-02-21 NOTE — Anesthesia Postprocedure Evaluation (Signed)
Anesthesia Post Note  Patient: Carle Dargan  Procedure(s) Performed: COLONOSCOPY WITH PROPOFOL  Patient location during evaluation: Endoscopy Anesthesia Type: General Level of consciousness: awake and alert Pain management: pain level controlled Vital Signs Assessment: post-procedure vital signs reviewed and stable Respiratory status: spontaneous breathing, nonlabored ventilation, respiratory function stable and patient connected to nasal cannula oxygen Cardiovascular status: blood pressure returned to baseline and stable Postop Assessment: no apparent nausea or vomiting Anesthetic complications: no  No notable events documented.   Last Vitals:  Vitals:   02/21/23 0838 02/21/23 0843  BP: (!) 87/31 115/84  Pulse:  70  Resp: 20 (!) 22  Temp:    SpO2:  100%    Last Pain:  Vitals:   02/21/23 0843  TempSrc:   PainSc: 0-No pain                 Stephanie Coup

## 2023-02-21 NOTE — Anesthesia Preprocedure Evaluation (Addendum)
Anesthesia Evaluation  Patient identified by MRN, date of birth, ID band Patient awake    Reviewed: Allergy & Precautions, NPO status , Patient's Chart, lab work & pertinent test results  Airway Mallampati: III  TM Distance: >3 FB Neck ROM: full    Dental  (+) Chipped, Dental Advidsory Given   Pulmonary asthma , former smoker   Pulmonary exam normal        Cardiovascular hypertension, (-) angina + Peripheral Vascular Disease  (-) Past MI Normal cardiovascular exam     Neuro/Psych  PSYCHIATRIC DISORDERS      negative neurological ROS     GI/Hepatic Neg liver ROS,GERD  Medicated and Controlled,,  Endo/Other  negative endocrine ROS    Renal/GU negative Renal ROS  negative genitourinary   Musculoskeletal   Abdominal   Peds  Hematology negative hematology ROS (+)   Anesthesia Other Findings Patient has a positive pregnancy test done here pre op. Patient states that she was told with her cancer that she would always test postive but there is no chance that she is actually pregnant. Explained all risks for undergoing anesthesia while pregnant. Will sign a consent form accepting all risk to the baby if she is pregnant.   Past Medical History: No date: Anxiety No date: Cancer No date: Depression No date: Heart murmur No date: Hypertension  Past Surgical History: No date: CESAREAN SECTION No date: DILATION AND CURETTAGE OF UTERUS 02/16/2023: IR IMAGING GUIDED PORT INSERTION     Reproductive/Obstetrics negative OB ROS                             Anesthesia Physical Anesthesia Plan  ASA: 2  Anesthesia Plan: General   Post-op Pain Management: Minimal or no pain anticipated   Induction: Intravenous  PONV Risk Score and Plan: 3 and Propofol infusion, TIVA and Ondansetron  Airway Management Planned: Nasal Cannula  Additional Equipment: None  Intra-op Plan:   Post-operative Plan:    Informed Consent: I have reviewed the patients History and Physical, chart, labs and discussed the procedure including the risks, benefits and alternatives for the proposed anesthesia with the patient or authorized representative who has indicated his/her understanding and acceptance.     Dental advisory given  Plan Discussed with: CRNA and Surgeon  Anesthesia Plan Comments: (Discussed risks of anesthesia with patient, including possibility of difficulty with spontaneous ventilation under anesthesia necessitating airway intervention, PONV, and rare risks such as cardiac or respiratory or neurological events, and allergic reactions. Discussed the role of CRNA in patient's perioperative care. Patient understands.)        Anesthesia Quick Evaluation

## 2023-02-23 ENCOUNTER — Encounter: Payer: Self-pay | Admitting: Gastroenterology

## 2023-02-24 ENCOUNTER — Ambulatory Visit: Payer: 59 | Admitting: Oncology

## 2023-02-24 ENCOUNTER — Inpatient Hospital Stay: Payer: 59

## 2023-02-24 ENCOUNTER — Ambulatory Visit
Admission: RE | Admit: 2023-02-24 | Discharge: 2023-02-24 | Disposition: A | Discharge status: Home / Self Care | Payer: 59 | Source: Ambulatory Visit | Attending: Oncology | Admitting: Oncology

## 2023-02-24 ENCOUNTER — Inpatient Hospital Stay: Payer: 59 | Admitting: Oncology

## 2023-02-24 ENCOUNTER — Other Ambulatory Visit: Payer: Self-pay

## 2023-02-24 DIAGNOSIS — I700 Atherosclerosis of aorta: Secondary | ICD-10-CM | POA: Insufficient documentation

## 2023-02-24 DIAGNOSIS — R590 Localized enlarged lymph nodes: Secondary | ICD-10-CM | POA: Insufficient documentation

## 2023-02-24 DIAGNOSIS — D259 Leiomyoma of uterus, unspecified: Secondary | ICD-10-CM | POA: Diagnosis not present

## 2023-02-24 DIAGNOSIS — C7970 Secondary malignant neoplasm of unspecified adrenal gland: Secondary | ICD-10-CM | POA: Diagnosis not present

## 2023-02-24 DIAGNOSIS — C801 Malignant (primary) neoplasm, unspecified: Secondary | ICD-10-CM | POA: Insufficient documentation

## 2023-02-24 DIAGNOSIS — Z3201 Encounter for pregnancy test, result positive: Secondary | ICD-10-CM | POA: Insufficient documentation

## 2023-02-24 DIAGNOSIS — C7951 Secondary malignant neoplasm of bone: Secondary | ICD-10-CM | POA: Insufficient documentation

## 2023-02-24 LAB — GLUCOSE, CAPILLARY: Glucose-Capillary: 89 mg/dL (ref 70–99)

## 2023-02-24 MED ORDER — FLUDEOXYGLUCOSE F - 18 (FDG) INJECTION
9.5000 | Freq: Once | INTRAVENOUS | Status: AC | PRN
  Administered 2023-02-24: 9.41 via INTRAVENOUS

## 2023-02-25 ENCOUNTER — Telehealth: Payer: Self-pay

## 2023-02-25 ENCOUNTER — Encounter: Payer: Self-pay | Admitting: Oncology

## 2023-02-25 ENCOUNTER — Other Ambulatory Visit: Payer: Self-pay | Admitting: Oncology

## 2023-02-25 DIAGNOSIS — C801 Malignant (primary) neoplasm, unspecified: Secondary | ICD-10-CM

## 2023-02-25 MED ORDER — LIDOCAINE-PRILOCAINE 2.5-2.5 % EX CREA
TOPICAL_CREAM | CUTANEOUS | 3 refills | Status: DC
Start: 2023-02-25 — End: 2023-08-31

## 2023-02-25 MED ORDER — ONDANSETRON HCL 8 MG PO TABS
8.0000 mg | ORAL_TABLET | Freq: Three times a day (TID) | ORAL | 2 refills | Status: DC | PRN
Start: 2023-02-25 — End: 2023-10-11

## 2023-02-25 MED ORDER — PROCHLORPERAZINE MALEATE 10 MG PO TABS
10.0000 mg | ORAL_TABLET | Freq: Four times a day (QID) | ORAL | 2 refills | Status: DC | PRN
Start: 2023-02-25 — End: 2023-10-06

## 2023-02-25 NOTE — Telephone Encounter (Signed)
Received call back from Sheryl Silva with family members on the phone. Answered multiple questions to the best of my ability for her. Encouraged her to keep appointment on Monday with intent to start treatment on Wednesday as it has been 2 months since she was diagnosed with cancer.

## 2023-02-25 NOTE — Telephone Encounter (Signed)
Spoke with Sheryl Silva. Updated that Dr. Orlie Dakin will see her Monday 4/29 at 1030 to review her treatment plan. She will attend chemo class on 4/30 with the plan to start treatment on Wednesday, 5/1. She verbalized understanding. She is having increased abdominal pain and back pain and was thinking about going to the ED. She was offered a symptom management visit here at the cancer center. She is going to speak to her family and let us know. Will request slides from [facility] for pathology review.

## 2023-02-25 NOTE — Progress Notes (Signed)
START OFF PATHWAY REGIMEN - Colorectal   [id number] (Leucovorin IV D1 + Fluorouracil IV D1/CIV D1,2 + Oxaliplatin IV D1) q14 Days:   A cycle is every 14 days:     Oxaliplatin      Leucovorin      Fluorouracil      Fluorouracil   **Always confirm dose/schedule in your pharmacy ordering system**  Patient Characteristics: Distant Metastases, Nonsurgical Candidate, KRAS/NRAS Wild-Type (BRAF V600 Wild-Type/Unknown), Targeted/Immunotherapy (MSI-H/dMMR or HER2-Positive), MSS/pMMR and HER2 Negative/Unknown Tumor Location: Colon Therapeutic Status: Distant Metastases Microsatellite/Mismatch Repair Status: Unknown BRAF Mutation Status: Wild-Type (no mutation) KRAS/NRAS Mutation Status: Wild-Type (no mutation) Preferred Therapy Approach: Targeted/Immunotherapy (MSI-H/dMMR or  HER2-Positive) HER2 Status: Negative Intent of Therapy: Non-Curative / Palliative Intent, Discussed with Patient

## 2023-02-26 ENCOUNTER — Other Ambulatory Visit: Payer: Self-pay

## 2023-02-28 ENCOUNTER — Other Ambulatory Visit: Payer: Self-pay

## 2023-02-28 ENCOUNTER — Encounter: Payer: Self-pay | Admitting: Oncology

## 2023-02-28 ENCOUNTER — Ambulatory Visit: Payer: 59 | Admitting: Oncology

## 2023-02-28 ENCOUNTER — Inpatient Hospital Stay (HOSPITAL_BASED_OUTPATIENT_CLINIC_OR_DEPARTMENT_OTHER): Payer: 59 | Admitting: Oncology

## 2023-02-28 VITALS — BP 127/80 | HR 73 | Temp 96.0°F | Resp 18 | Wt 178.0 lb

## 2023-02-28 DIAGNOSIS — C801 Malignant (primary) neoplasm, unspecified: Secondary | ICD-10-CM | POA: Diagnosis not present

## 2023-02-28 DIAGNOSIS — C7951 Secondary malignant neoplasm of bone: Secondary | ICD-10-CM | POA: Diagnosis not present

## 2023-02-28 DIAGNOSIS — R52 Pain, unspecified: Secondary | ICD-10-CM | POA: Diagnosis not present

## 2023-02-28 DIAGNOSIS — R590 Localized enlarged lymph nodes: Secondary | ICD-10-CM | POA: Diagnosis not present

## 2023-02-28 NOTE — Progress Notes (Signed)
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  INTERVAL HISTORY: Patient returns to clinic today for further evaluation, discussion of her PET scan results, and treatment planning.  She continues to have pain and weight loss, but otherwise feels well. She has no neurologic complaints.  She denies any recent fevers or illnesses.  She has no chest pain, shortness of breath, cough, or hemoptysis.  She denies any nausea, vomiting, constipation, or diarrhea.  She has no urinary complaints.  Patient offers no further specific complaints today.  REVIEW OF SYSTEMS:   Review of Systems  Constitutional:  Positive for malaise/fatigue and weight loss. Negative for fever.  Respiratory: Negative.  Negative for cough, hemoptysis and shortness of breath.   Cardiovascular: Negative.  Negative for chest pain and leg swelling.  Gastrointestinal: Negative.  Negative for abdominal pain.  Genitourinary: Negative.  Negative for dysuria.  Musculoskeletal:  Positive for back pain.  Skin: Negative.  Negative for rash.  Neurological: Negative.  Negative for dizziness, focal weakness, weakness and headaches.  Psychiatric/Behavioral: Negative.  The patient is not nervous/anxious.     As per HPI. Otherwise, a complete review of systems is negative.  PAST MEDICAL HISTORY: Past Medical History:  Diagnosis Date   Anxiety    Cancer (HCC)    Depression    Heart murmur    Hypertension     PAST SURGICAL HISTORY: Past Surgical History:  Procedure Laterality Date   CESAREAN SECTION     COLONOSCOPY WITH PROPOFOL N/A  02/21/2023   Procedure: COLONOSCOPY WITH PROPOFOL;  Surgeon: Regis Bill, MD;  Location: [facility] ENDOSCOPY;  Service: Endoscopy;  Laterality: N/A;   DILATION AND CURETTAGE OF UTERUS     IR IMAGING GUIDED PORT INSERTION  02/16/2023    FAMILY HISTORY: Family History  Problem Relation Age of Onset   Cancer Maternal Grandmother     ADVANCED DIRECTIVES (Y/N):  N  HEALTH MAINTENANCE: Social History   Tobacco Use   Smoking status: Former    Packs/day: .1    Types: Cigarettes    Quit date: 12/19/2022    Years since quitting: 0.1   Smokeless tobacco: Never   Tobacco comments:    Quit smoking 12/2022  Vaping Use   Vaping Use: Never used  Substance Use Topics   Alcohol use: Yes    Comment: occ   Drug use: No     Colonoscopy:  PAP:  Bone density:  Lipid panel:  Allergies  Allergen Reactions   Nifedipine Palpitations    Current Outpatient Medications  Medication Sig Dispense Refill   albuterol (PROVENTIL) (2.5 MG/3ML) 0.083% nebulizer solution Take 2.5 mg by nebulization every 6 (six) hours as needed for wheezing or shortness of breath.     cetirizine (ZYRTEC ALLERGY) 10 MG tablet Take 1 tablet (10 mg total) by mouth daily. 30 tablet 0   cyanocobalamin (VITAMIN B12) 500 MCG tablet Take 500 mcg by mouth daily.     gabapentin (NEURONTIN) 300 MG capsule Take 300 mg by mouth 3 (three) times daily.     lidocaine-prilocaine (EMLA) cream Apply to affected area once 30 g 3   Multiple Vitamin (MULTIVITAMIN) tablet Take 1 tablet  by mouth daily.     omeprazole (PRILOSEC) 20 MG capsule Take 20 mg by mouth daily.     ondansetron (ZOFRAN) 8 MG tablet Take 1 tablet (8 mg total) by mouth every 8 (eight) hours as needed for nausea or vomiting. Start on the third day after chemotherapy. 60 tablet 2   ondansetron (ZOFRAN-ODT) 4 MG disintegrating tablet Take 4 mg by mouth 2 (two) times daily as needed for nausea or vomiting.     oxyCODONE-acetaminophen (PERCOCET/ROXICET) 5-325 MG tablet Take  1 tablet by mouth 2 (two) times daily.     polyethylene glycol (MIRALAX / GLYCOLAX) 17 g packet Take 17 g by mouth daily.     prochlorperazine (COMPAZINE) 10 MG tablet Take 1 tablet (10 mg total) by mouth every 6 (six) hours as needed for nausea or vomiting. 60 tablet 2   senna (SENOKOT) 8.6 MG tablet Take 2 tablets by mouth daily.     tiZANidine (ZANAFLEX) 2 MG tablet Take 2 mg by mouth at bedtime.     traMADol (ULTRAM-ER) 100 MG 24 hr tablet Take 100 mg by mouth daily.     Vitamin D, Ergocalciferol, (DRISDOL) 1.25 MG (50000 UNIT) CAPS capsule Take 50,000 Units by mouth every 7 (seven) days.     cilostazol (PLETAL) 100 MG tablet Take 100 mg by mouth 2 (two) times daily. (Patient not taking: Reported on 02/14/2023)     cyclobenzaprine (FLEXERIL) 10 MG tablet Take 10 mg by mouth 3 (three) times daily as needed for muscle spasms. (Patient not taking: Reported on 02/14/2023)     DULoxetine (CYMBALTA) 30 MG capsule Take 30 mg by mouth daily. (Patient not taking: Reported on 02/14/2023)     naloxone [facility]) nasal spray 4 mg/0.1 mL Place 1 spray into the nose once. (Patient not taking: Reported on 02/14/2023)     NIFEdipine (ADALAT CC) 30 MG 24 hr tablet Take 30 mg by mouth daily. (Patient not taking: Reported on 02/14/2023)     potassium chloride (KLOR-CON) 10 MEQ tablet Take 10 mEq by mouth daily. (Patient not taking: Reported on 02/14/2023)     No current facility-administered medications for this visit.    OBJECTIVE: Vitals:   02/28/23 1028  BP: 127/80  Pulse: 73  Resp: 18  Temp: (!) 96 F (35.6 C)  SpO2: 100%     Body mass index is 34.76 kg/m.    ECOG FS:1 - Symptomatic but completely ambulatory  General: Well-developed, well-nourished, no acute distress. Eyes: Pink conjunctiva, anicteric sclera. HEENT: Normocephalic, moist mucous membranes. Lungs: No audible wheezing or coughing. Heart: Regular rate and rhythm. Abdomen: Soft, nontender, no obvious distention. Musculoskeletal: No edema,  cyanosis, or clubbing. Neuro: Alert, answering all questions appropriately. Cranial nerves grossly intact. Skin: No rashes or petechiae noted. Psych: Normal affect.  LAB RESULTS:  Lab Results  Component Value Date   NA 137 09/27/2018   K 4.3 09/27/2018   CL 104 09/27/2018   CO2 23 09/27/2018   GLUCOSE 94 09/27/2018   BUN 15 09/27/2018   CREATININE 0.95 09/27/2018   CALCIUM 9.2 09/27/2018   PROT 7.2 09/27/2018   ALBUMIN 4.0 09/27/2018   AST 24 09/27/2018   ALT 22 09/27/2018   ALKPHOS 46 09/27/2018   BILITOT 0.4 09/27/2018   GFRNONAA >60 09/27/2018   GFRAA >60 09/27/2018    Lab Results  Component Value Date   WBC 7.2 09/27/2018   NEUTROABS 2.9 08/12/2017   HGB 15.1 (H) 09/27/2018   HCT 44.5 09/27/2018   MCV  90.3 09/27/2018   PLT 270 09/27/2018     STUDIES: NM PET Image Initial (PI) Skull Base To Thigh  Result Date: 02/24/2023 CLINICAL DATA:  Subsequent treatment strategy for adenocarcinoma of unknown origin. EXAM: NUCLEAR MEDICINE PET SKULL BASE TO THIGH TECHNIQUE: 9.41 mCi F-18 FDG was injected intravenously. Full-ring PET imaging was performed from the skull base to thigh after the radiotracer. CT data was obtained and used for attenuation correction and anatomic localization. Fasting blood glucose: 89 mg/dl COMPARISON:  Outside PET-CT from [facility] dated 12/22/2022 FINDINGS: Mediastinal blood pool activity: SUV max 2.39 Liver activity: SUV max NA NECK: Multi station hypermetabolic cervical lymphadenopathy. SUV max in right level 2 node has an SUV max of 8.07. Left-sided posterior cervical lymph node has an SUV max of 6.0 Incidental CT findings: None CHEST: Hypermetabolic axillary and mediastinal lymph nodes. 9 mm node in the left axilla on image 47/4 has an SUV max of 6.77. Right axillary node measuring 10 mm on image 46/4 has an SUV max of 5.79. 9 mm prevascular node has an SUV max of 5.12. Precarinal node has an SUV max of 4.10. No worrisome pulmonary nodules. No  hypermetabolic breast lesions are identified. Incidental CT findings: Right IJ Port-A-Cath in good position. ABDOMEN/PELVIS: 9 mm celiac axis node has an SUV max of 8.19. 12 mm para duodenal node has an SUV max of 4.67. Bilateral retroperitoneal hypermetabolic adenopathy. Inter aortocaval node has an SUV max of 6.98. Left para-aortic lymph nodes with SUV max of 6.17. No hepatic, splenic, pancreatic or adrenal gland lesions. No hypermetabolic bowel lesions are identified. No obstructive findings. Incidental CT findings: Cholelithiasis noted. Age advanced atherosclerotic calcifications involving the abdominal aorta and iliac arteries. Left adnexal adnexal lesion measuring 4.6 x 4.4 cm. Part of this is a simple cyst but other parts are solid and demonstrate low level hypermetabolism with SUV max of 3.62. Interval increase in size when compared to the prior CT scan from 12/18/2022. The right ovary is normal. The uterus is unremarkable. SKELETON: Diffuse hypermetabolic skeletal metastatic disease. This is largely sclerotic disease although there are some areas of mixed lytic and sclerotic changes. There is diffuse involvement of the axial and appendicular skeleton. Index lesions: Left proximal humerus SUV max 6.20, C2 SUV max 5.63, left manubrial sternum SUV max 7.08, T11 7.75, L5 8.87, left iliac bone 9.47, intertrochanteric region of the right hip 9.25. Incidental CT findings: No findings for spinal canal compromise. There is some cortical irregularity/erosive change involving the right hip lesion. IMPRESSION: 1. Diffuse hypermetabolic skeletal metastatic disease. 2. Hypermetabolic cervical, axillary, mediastinal, upper abdominal and retroperitoneal adenopathy. 3. Enlarging left adnexal lesion with low level hypermetabolism. No other definite primary neoplastic site is identified. 4. Age advanced atherosclerotic calcifications involving the abdominal aorta and iliac arteries. 5. Aortic atherosclerosis. Aortic  Atherosclerosis (ICD10-I70.0). Electronically Signed   By: Rudie Meyer M.D.   On: 02/24/2023 15:52   US PELVIC COMPLETE WITH TRANSVAGINAL  Result Date: 02/24/2023 CLINICAL DATA:  Positive pregnancy test. Thickened endometrial stripe. Quantitative beta HCG was 53.1 on 02/08/2023. EXAM: TRANSABDOMINAL AND TRANSVAGINAL ULTRASOUND OF PELVIS TECHNIQUE: Both transabdominal and transvaginal ultrasound examinations of the pelvis were performed. Transabdominal technique was performed for global imaging of the pelvis including uterus, ovaries, adnexal regions, and pelvic cul-de-sac. It was necessary to proceed with endovaginal exam following the transabdominal exam to visualize the uterus, endometrium ovaries, and adnexal regions. COMPARISON:  02/04/2023 ultrasound and 02/03/2023 CT of the abdomen and pelvis from [facility] FINDINGS: Uterus Measurements:  7.6 x 3.9 x 4.0 centimeters = volume: 61 mL. Uterus is anteverted. There is no intrauterine gestational sac. Small posterior intramural fibroid is 1.4 x 0.9 x 1.3 centimeters. Nabothian cyst in the UPPER cervix is 1.9 centimeters. Endometrium Thickness: 6.7 millimeters.  No focal abnormality visualized. Right ovary Measurements: 2.2 x 1.4 x 1.7 centimeters = volume: 3.0 mL. Normal appearance/no adnexal mass. Left ovary Measurements: A normal LEFT ovary is not identified. Within the LEFT adnexal region there is a solid hypervascular mass measuring 4.8 x 4.6 x 4.3 centimeters. Other findings Small moderate free pelvic fluid. IMPRESSION: 1. No ultrasound evidence for intrauterine pregnancy. Early intrauterine or adnexal pregnancy cannot be entirely excluded. 2. Normal appearance of the RIGHT ovary. 3. Solid hypervascular mass in the LEFT adnexal region is 4.8 centimeters. Findings are suspicious for ovarian metastasis or primary ovarian neoplasm. Electronically Signed   By: Norva Pavlov M.D.   On: 02/24/2023 14:15   IR IMAGING GUIDED PORT INSERTION  Result Date:  02/16/2023 INDICATION: chemotherapy administration EXAM: Chest port placement using ultrasound and fluoroscopic guidance MEDICATIONS: Documented in the EMR ANESTHESIA/SEDATION: Moderate (conscious) sedation was employed during this procedure. A total of Versed 2 mg and Fentanyl 125 mcg was administered intravenously. Moderate Sedation Time: 36 minutes. The patient's level of consciousness and vital signs were monitored continuously by radiology nursing throughout the procedure under my direct supervision. FLUOROSCOPY TIME:  Fluoroscopy Time: 0.8 minutes (4 mGy) COMPLICATIONS: None immediate. PROCEDURE: Informed written consent was obtained from the patient after a thorough discussion of the procedural risks, benefits and alternatives. All questions were addressed. Maximal Sterile Barrier Technique was utilized including caps, mask, sterile gowns, sterile gloves, sterile drape, hand hygiene and skin antiseptic. A timeout was performed prior to the initiation of the procedure. The patient was placed supine on the exam table. The right neck and chest was prepped and draped in the standard sterile fashion. A preliminary ultrasound of the right neck was performed and demonstrates a patent right internal jugular vein. A permanent ultrasound image was stored in the electronic medical record. The overlying skin was anesthetized with 1% Lidocaine. Using ultrasound guidance, access was obtained into the right internal jugular vein using a 21 gauge micropuncture set. A wire was advanced into the SVC, a short incision was made at the puncture site, and serial dilatation performed. Next, in an ipsilateral infraclavicular location, an incision was made at the site of the subcutaneous reservoir. Blunt dissection was used to open a pocket to contain the reservoir. A subcutaneous tunnel was then created from the port site to the puncture site. A(n) 8 Fr single lumen catheter was advanced through the tunnel. The catheter was attached  to the port and this was placed in the subcutaneous pocket. Under fluoroscopic guidance, a peel away sheath was placed, and the catheter was trimmed to the appropriate length and was advanced into the central veins. The catheter length is 23 cm. The tip of the catheter lies near the superior cavoatrial junction. The port flushes and aspirates appropriately. The port was flushed and locked with heparinized saline. The port pocket was closed in 2 layers using 3-0 and 4-0 Vicryl/absorbable suture. Dermabond was also applied to both incisions. The patient tolerated the procedure well and was transferred to recovery in stable condition. IMPRESSION: Successful placement of a right-sided chest port via the right internal jugular vein. The port is ready for immediate use. Electronically Signed   By: Olive Bass M.D.   On: 02/16/2023 15:16  CT OUTSIDE FILMS CHEST  Result Date: 02/15/2023 This examination belongs to an outside facility and is stored here for comparison purposes only.  Contact the originating outside institution for any associated report or interpretation.   ONCOLOGY: Patient's workup was initiated at [facility] in February 2024 at which point PET scan revealed widely metastatic disease with innumerable sclerotic bony lesions as well as axillary, mediastinal, and abdominal lymphadenopathy.  Subsequent biopsy of a right axillary lymph node revealed signet ring adenocarcinoma, likely GI origin.  Additional testing revealed high tumor mutational burden.  Further workup at [facility] in April 2024 revealed a negative EGD, and negative CT scanning of the brain.  CT scan of the abdomen and follow-up pelvic ultrasound revealed a large left adnexal mass that was not commented on PET scan from February.  CA 19-9 is normal, but CA125 and CEA are mildly elevated.    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  PLAN:    Metastatic signet ring adenocarcinoma,  likely GI origin: See workup from outside facilities as above.  Repeat PET scan on February 24, 2023 reviewed independently and report as above confirming widespread metastatic disease.  Left adnexal mass is now PET positive of unclear clinical significance.  Patient will have a gyn-onc appointment on Wednesday.  Plan is to treat with FOLFOX plus Keytruda every 3 weeks for 12 cycles followed by maintenance Keytruda for at least 2 years.  Patient has had port placed.  Return to clinic on Mar 02, 2023 to initiate cycle 1.   Adnexal mass: PET scan as above.  I have requested pathology from outside biopsy for further evaluation.  Follow-up with gyn-onc as above. Pain: Patient reports that she is seen in the pain clinic in [place], [place].  Can consider radiation oncology in the future if necessary.  I spent a total of 30 minutes reviewing chart data, face-to-face evaluation with the patient, counseling and coordination of care as detailed above.   Patient expressed understanding and was in agreement with this plan. She also understands that She can call clinic at any time with any questions, concerns, or complaints.    Cancer Staging  Signet ring cell adenocarcinoma [facility]) Staging form: Exocrine Pancreas, AJCC 8th Edition - Clinical stage from 02/07/2023: Stage IV (cTX, cNX, pM1) - Signed by Jeralyn Ruths, MD on 02/14/2023 Stage prefix: Initial diagnosis   Jeralyn Ruths, MD   02/28/2023 10:43 AM

## 2023-03-01 ENCOUNTER — Inpatient Hospital Stay: Payer: 59

## 2023-03-01 ENCOUNTER — Other Ambulatory Visit: Payer: Self-pay | Admitting: Oncology

## 2023-03-01 ENCOUNTER — Telehealth: Payer: Self-pay | Admitting: Oncology

## 2023-03-01 ENCOUNTER — Encounter: Payer: Self-pay | Admitting: Gastroenterology

## 2023-03-01 ENCOUNTER — Inpatient Hospital Stay: Payer: 59 | Admitting: Oncology

## 2023-03-01 MED FILL — Dexamethasone Sodium Phosphate Inj 100 MG/10ML: INTRAMUSCULAR | Qty: 1 | Status: AC

## 2023-03-01 NOTE — Telephone Encounter (Signed)
Patient would like to change 5/21 and future appointments to be on Monday. Please advise on scheduling.   Thank you,

## 2023-03-01 NOTE — Progress Notes (Signed)
Nutrition Assessment   Reason for Assessment:  Starting chemotherapy   ASSESSMENT:  47 year old female with metastatic signet ring adenocarcinoma, likely GI origin.  Past medical history of anxiety, depression, heart murmur, HTN.  Planning folfox plus keytruda for 12 cycles than maintenance keytruda.  Met with patient following chemotherapy education.  Patient reports that she is eating to live, not living to eat.  Has done this for most of her life.  Reports that her family has been doing research and wants her to eat just fruits and vegetables.  She has been doing this mostly along with jucing.  She ate chicken and broccoli recently.  Did have pizza and salad.  She wants to be able to eat what she wants to eat. Denies any nutrition impact symptoms at this time.    Medications: compazine, zofran, MVI, KCL, miralax, senna, prilosec, Vit D, Vit B 12   Labs: reviewed   Anthropometrics:   Height: 60 inches Weight: 178 lb UBW: 190-200 lb a couple of months ago per patient BMI: 34  6% weight loss in the last 2 months, concerning   Estimated Energy Needs  Kcals: 2025-2400 Protein: 101-120 g Fluid: 2025-2400 ml   NUTRITION DIAGNOSIS: Unintentional weight loss related to cancer diagnosis as evidenced by 6% weight loss in the last 2 months and changing eating habits.    INTERVENTION:  Recommend liberalizing diet especially with planned chemotherapy and potential side effects from treatment effecting intake.  Food is also causing additional stress on patient at this time.  RD worked to relieve some of this stress Encouraged good sources of protein Contact information provided   MONITORING, EVALUATION, GOAL: weight trends, intake   Next Visit: Tuesday, May 21 phone call  Sheryl Silva B. Sheryl Silva, RD, LDN Registered Dietitian [phone]

## 2023-03-02 ENCOUNTER — Inpatient Hospital Stay (HOSPITAL_BASED_OUTPATIENT_CLINIC_OR_DEPARTMENT_OTHER): Payer: 59 | Admitting: Obstetrics and Gynecology

## 2023-03-02 ENCOUNTER — Other Ambulatory Visit: Payer: Self-pay

## 2023-03-02 ENCOUNTER — Other Ambulatory Visit: Payer: 59

## 2023-03-02 ENCOUNTER — Inpatient Hospital Stay: Payer: 59

## 2023-03-02 ENCOUNTER — Inpatient Hospital Stay: Payer: 59 | Attending: Oncology | Admitting: Oncology

## 2023-03-02 VITALS — BP 146/87 | HR 72 | Temp 97.8°F | Resp 20 | Wt 175.0 lb

## 2023-03-02 DIAGNOSIS — Z452 Encounter for adjustment and management of vascular access device: Secondary | ICD-10-CM | POA: Insufficient documentation

## 2023-03-02 DIAGNOSIS — N9489 Other specified conditions associated with female genital organs and menstrual cycle: Secondary | ICD-10-CM

## 2023-03-02 DIAGNOSIS — C801 Malignant (primary) neoplasm, unspecified: Secondary | ICD-10-CM

## 2023-03-02 DIAGNOSIS — Z79620 Long term (current) use of immunosuppressive biologic: Secondary | ICD-10-CM | POA: Diagnosis not present

## 2023-03-02 DIAGNOSIS — R7401 Elevation of levels of liver transaminase levels: Secondary | ICD-10-CM | POA: Diagnosis not present

## 2023-03-02 DIAGNOSIS — D649 Anemia, unspecified: Secondary | ICD-10-CM | POA: Insufficient documentation

## 2023-03-02 DIAGNOSIS — C778 Secondary and unspecified malignant neoplasm of lymph nodes of multiple regions: Secondary | ICD-10-CM | POA: Diagnosis not present

## 2023-03-02 DIAGNOSIS — C7951 Secondary malignant neoplasm of bone: Secondary | ICD-10-CM | POA: Diagnosis not present

## 2023-03-02 DIAGNOSIS — R970 Elevated carcinoembryonic antigen [CEA]: Secondary | ICD-10-CM | POA: Diagnosis not present

## 2023-03-02 DIAGNOSIS — Z111 Encounter for screening for respiratory tuberculosis: Secondary | ICD-10-CM | POA: Insufficient documentation

## 2023-03-02 DIAGNOSIS — Z5112 Encounter for antineoplastic immunotherapy: Secondary | ICD-10-CM | POA: Insufficient documentation

## 2023-03-02 LAB — CBC WITH DIFFERENTIAL (CANCER CENTER ONLY)
Abs Immature Granulocytes: 0.03 10*3/uL (ref 0.00–0.07)
Basophils Absolute: 0 10*3/uL (ref 0.0–0.1)
Basophils Relative: 1 %
Eosinophils Absolute: 0.2 10*3/uL (ref 0.0–0.5)
Eosinophils Relative: 4 %
HCT: 35.5 % — ABNORMAL LOW (ref 36.0–46.0)
Hemoglobin: 11.8 g/dL — ABNORMAL LOW (ref 12.0–15.0)
Immature Granulocytes: 1 %
Lymphocytes Relative: 25 %
Lymphs Abs: 1.4 10*3/uL (ref 0.7–4.0)
MCH: 29.8 pg (ref 26.0–34.0)
MCHC: 33.2 g/dL (ref 30.0–36.0)
MCV: 89.6 fL (ref 80.0–100.0)
Monocytes Absolute: 0.5 10*3/uL (ref 0.1–1.0)
Monocytes Relative: 9 %
Neutro Abs: 3.4 10*3/uL (ref 1.7–7.7)
Neutrophils Relative %: 60 %
Platelet Count: 265 10*3/uL (ref 150–400)
RBC: 3.96 MIL/uL (ref 3.87–5.11)
RDW: 12.5 % (ref 11.5–15.5)
WBC Count: 5.5 10*3/uL (ref 4.0–10.5)
nRBC: 0 % (ref 0.0–0.2)

## 2023-03-02 LAB — PREGNANCY, URINE: Preg Test, Ur: NEGATIVE

## 2023-03-02 LAB — TSH: TSH: 0.637 u[IU]/mL (ref 0.350–4.500)

## 2023-03-02 LAB — CMP (CANCER CENTER ONLY)
ALT: 77 U/L — ABNORMAL HIGH (ref 0–44)
AST: 56 U/L — ABNORMAL HIGH (ref 15–41)
Albumin: 3.7 g/dL (ref 3.5–5.0)
Alkaline Phosphatase: 599 U/L — ABNORMAL HIGH (ref 38–126)
Anion gap: 7 (ref 5–15)
BUN: 16 mg/dL (ref 6–20)
CO2: 23 mmol/L (ref 22–32)
Calcium: 8.7 mg/dL — ABNORMAL LOW (ref 8.9–10.3)
Chloride: 103 mmol/L (ref 98–111)
Creatinine: 0.83 mg/dL (ref 0.44–1.00)
GFR, Estimated: >60 mL/min (ref >60)
Glucose, Bld: 89 mg/dL (ref 70–99)
Potassium: 4 mmol/L (ref 3.5–5.1)
Sodium: 134 mmol/L — ABNORMAL LOW (ref 135–145)
Total Bilirubin: 0.4 mg/dL (ref 0.3–1.2)
Total Protein: 7.2 g/dL (ref 6.5–8.1)

## 2023-03-02 MED ORDER — FLUOROURACIL CHEMO INJECTION 2.5 GM/50ML
400.0000 mg/m2 | Freq: Once | INTRAVENOUS | Status: AC
  Administered 2023-03-02: 750 mg via INTRAVENOUS
  Filled 2023-03-02: qty 15

## 2023-03-02 MED ORDER — LEUCOVORIN CALCIUM INJECTION 350 MG
400.0000 mg/m2 | Freq: Once | INTRAVENOUS | Status: AC
  Administered 2023-03-02: 748 mg via INTRAVENOUS
  Filled 2023-03-02: qty 37.4

## 2023-03-02 MED ORDER — SODIUM CHLORIDE 0.9 % IV SOLN
200.0000 mg | Freq: Once | INTRAVENOUS | Status: AC
  Administered 2023-03-02: 200 mg via INTRAVENOUS
  Filled 2023-03-02: qty 200

## 2023-03-02 MED ORDER — DEXTROSE 5 % IV SOLN
Freq: Once | INTRAVENOUS | Status: AC
  Filled 2023-03-02: qty 250

## 2023-03-02 MED ORDER — SODIUM CHLORIDE 0.9 % IV SOLN
2400.0000 mg/m2 | INTRAVENOUS | Status: DC
  Administered 2023-03-02: 4500 mg via INTRAVENOUS
  Filled 2023-03-02: qty 90

## 2023-03-02 MED ORDER — PALONOSETRON HCL INJECTION 0.25 MG/5ML
0.2500 mg | Freq: Once | INTRAVENOUS | Status: AC
  Administered 2023-03-02: 0.25 mg via INTRAVENOUS
  Filled 2023-03-02: qty 5

## 2023-03-02 MED ORDER — SODIUM CHLORIDE 0.9 % IV SOLN
10.0000 mg | Freq: Once | INTRAVENOUS | Status: AC
  Administered 2023-03-02: 10 mg via INTRAVENOUS
  Filled 2023-03-02: qty 10

## 2023-03-02 MED ORDER — OXALIPLATIN CHEMO INJECTION 100 MG/20ML
85.0000 mg/m2 | Freq: Once | INTRAVENOUS | Status: AC
  Administered 2023-03-02: 150 mg via INTRAVENOUS
  Filled 2023-03-02: qty 30

## 2023-03-02 NOTE — Progress Notes (Signed)
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 female who is seen in consultation from Dr. 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   12/17/22- presented to [facility] with finger pain & was admitted.  12/18/22- CTA Chest showed diffuse sclerotic lesions through axial and appendicular skeleton 12/19/22- CT AP with widespread sclerotic lesions throughout skeleton, enlarged porta hepatic LN, CEA 20, CA 19-9 6, CA125 35.9 12/22/22- PET with diffuse sclerosis throughout appendicular and axial skeleton without hypermetabolic activity, mild avidity in porta hepatis LN and mildly prominent R>L axillary LN 12/23/22, mammogram with no breast mass, re-demonstration of axillary adenopathy. 12/23/22, biopsy of axillary LN with pathology revelaing the following: Metastatic adenocarcinoma involving lymph node. Comment: "The biopsies show lymph node extensively replaced by adenocarcinoma. Many of the tumor cells have a signet ring cell morphology. The presence of CDX2 expression by the tumor cells suggests this could be of gastrointestinal origin, with stomach being a relatively common site for signet ring cell carcinomas. Breast origin is less likely, but cannot be entirely excluded." ER/PR negative and her2 2+ by IHC / negative by FISH; CPS 50 12/29/22, EGD with no abnormality and random biopsy negative for malignancy / H. Pylori 01/13/23, seen in clinic by Dr. Rod Mae. 5FU/Nivo was recommended with palliative intent.  02/02/23- went to ER at [facility] for mid chest pain, shortness of breath. She was admitted for management of pain. She was seen by palliative care and pain medication was adjusted. CT CAP while here at [facility] with "innumerable sclerotic lesions throughout the axillary and appendicular skeleton  consistent with known metastatic disease, no acute pathologic fracture. It demonstrated axillary, mediastinal, and abdominal lymphadenopathy. There is a heterogenous structure in the left adnexa continuous with the uterus, most likely representing an exophytic subserosal fibroid. No PE." Pelvic TVUS demonstrating 3.9 x 2.9 x 3.8 solid left adnexal mass with internal vascularity and single unilocular cyst corresponding to mass seen on CT. Normal right ovary and normal uterus. EMS measuring 6 mm, and overall average sized uterus. At [facility], Solid oncology consulted, recommended AFP, tumor markers, gyn consult and OP colonoscopy.: so far results as follows:CEA 33.5 (H), CA 125 56 (H), AFP 5.6 (wnl) , HCG 46.4, inhibin A 3 and inhibin B <10. 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 Patient was seen by gyn onc and med onc during hospitalization.  02/14/23- Seen by Dr Orlie Dakin for 2nd opinion at [facility].  02/15/23- Seen by GI at [facility] for colonoscopy. Perianal, DRE were normal. Terminal ileum was normal. Entire colon was normal. No specimens were collected.  02/21/23- Colonoscopy with Dr. Mia Creek  02/24/23- PET at [facility]- diffuse hypermetabolic skeletal metastatic disease. Hypermetabolic cervical, axillary, mediastinal, upper abdominal, and retroperitoneal adenopathy, enlarging left adnexal lesion with low level hypermetabolism. No other definite primary neoplastic site identified. Age advanced atherosclerotic calcifications involving the abdominal aorta and iliac arteries.   Of note, she had endometrial biopsy at [facility] in 2018 for AUB, disordered proliferative endometrium, no hyperplasia or cancer identified. No menses in past 6 months.   She would like another opinion regarding enlarging adnexal mass and questions chemotherapy plan. She is scheduled to start chemo & immunotherapy with Dr. Orlie Dakin today. Complains of ongoing weight loss, low grade fevers, and night sweats.   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   Cyanosis of tip of finger 12/10/2022   Hypertension 12/10/2022    Past Medical History: Past Medical History:  Diagnosis Date   Anxiety    Cancer (HCC)    Depression    Heart murmur    Hypertension     Past Surgical History: Past Surgical History:  Procedure Laterality Date   CESAREAN SECTION     COLONOSCOPY WITH PROPOFOL N/A 02/21/2023   Procedure: COLONOSCOPY WITH PROPOFOL;  Surgeon: Regis Bill, MD;  Location: [facility] ENDOSCOPY;  Service: Endoscopy;  Laterality: N/A;   DILATION AND CURETTAGE OF UTERUS     IR IMAGING GUIDED PORT INSERTION  02/16/2023    Past Gynecologic History:  Post menopausal    OB History:  OB History  No obstetric history on file.  G5 P2 T0 P0 A2 L2 SAB2 TAB0 Ectopic0 Molar0 Multiple0 Live Births2   Family History: Family History  Problem Relation Age of Onset   Cancer Maternal Grandmother     Social History: Social History   Socioeconomic History   Marital status: Divorced    Spouse name: Not on file   Number of children: Not on file   Years of education: Not on file   Highest education level: Not on file  Occupational History   Not on file  Tobacco Use   Smoking status: Former    Packs/day: .1    Types: Cigarettes    Quit date: 12/19/2022    Years since quitting: 0.2   Smokeless tobacco: Never   Tobacco comments:    Quit smoking 12/2022  Vaping Use   Vaping Use: Never used  Substance and Sexual Activity   Alcohol use: Yes    Comment: occ   Drug use: No   Sexual activity: Not on file  Other Topics Concern   Not on file  Social History Narrative   Not on file   Social Determinants of Health   Financial Resource Strain: Medium Risk (02/15/2023)   Overall Financial Resource Strain (CARDIA)    Difficulty of Paying Living Expenses: Somewhat hard  Food Insecurity: Food Insecurity Present (02/14/2023)   Hunger  Vital Sign    Worried About Running Out of Food in the Last Year: Sometimes true    Ran Out of Food in the Last Year: Sometimes true  Transportation Needs: Unmet Transportation Needs (02/14/2023)   PRAPARE - Transportation    Lack of Transportation (Medical): Yes    Lack of Transportation (Non-Medical): Yes  Physical Activity: Inactive (02/15/2023)   Exercise Vital Sign    Days of Exercise per Week: 0 days    Minutes of Exercise per Session: 0 min  Stress: Stress Concern Present (02/15/2023)   [employer] of Occupational Health - Occupational Stress Questionnaire    Feeling of Stress : To some extent  Social Connections: Socially Isolated (02/15/2023)   Social Connection and Isolation Panel [NHANES]    Frequency of Communication with Friends and Family: More than three times a week    Frequency of Social Gatherings with Friends and Family: Once a week    Attends Religious Services: Never    Database administrator or Organizations: No    Attends Banker Meetings: Never    Marital Status: Divorced  Catering manager Violence: Not At Risk (02/14/2023)   Humiliation, Afraid, Rape, and Kick questionnaire    Fear of Current or Ex-Partner: No    Emotionally Abused: No  Physically Abused: No    Sexually Abused: No    Allergies: Allergies  Allergen Reactions   Nifedipine Palpitations    Current Medications: Current Outpatient Medications  Medication Sig Dispense Refill   albuterol (PROVENTIL) (2.5 MG/3ML) 0.083% nebulizer solution Take 2.5 mg by nebulization every 6 (six) hours as needed for wheezing or shortness of breath.     cetirizine (ZYRTEC ALLERGY) 10 MG tablet Take 1 tablet (10 mg total) by mouth daily. 30 tablet 0   cyanocobalamin (VITAMIN B12) 500 MCG tablet Take 500 mcg by mouth daily.     DULoxetine (CYMBALTA) 30 MG capsule Take 30 mg by mouth daily.     gabapentin (NEURONTIN) 300 MG capsule Take 300 mg by mouth 3 (three) times daily.      lidocaine-prilocaine (EMLA) cream Apply to affected area once 30 g 3   Multiple Vitamin (MULTIVITAMIN) tablet Take 1 tablet by mouth daily.     omeprazole (PRILOSEC) 20 MG capsule Take 20 mg by mouth daily.     ondansetron (ZOFRAN) 8 MG tablet Take 1 tablet (8 mg total) by mouth every 8 (eight) hours as needed for nausea or vomiting. Start on the third day after chemotherapy. 60 tablet 2   ondansetron (ZOFRAN-ODT) 4 MG disintegrating tablet Take 4 mg by mouth 2 (two) times daily as needed for nausea or vomiting.     oxyCODONE-acetaminophen (PERCOCET/ROXICET) 5-325 MG tablet Take 1 tablet by mouth 2 (two) times daily.     polyethylene glycol (MIRALAX / GLYCOLAX) 17 g packet Take 17 g by mouth daily.     prochlorperazine (COMPAZINE) 10 MG tablet Take 1 tablet (10 mg total) by mouth every 6 (six) hours as needed for nausea or vomiting. 60 tablet 2   senna (SENOKOT) 8.6 MG tablet Take 2 tablets by mouth daily.     tiZANidine (ZANAFLEX) 2 MG tablet Take 2 mg by mouth at bedtime.     traMADol (ULTRAM-ER) 100 MG 24 hr tablet Take 100 mg by mouth daily.     Vitamin D, Ergocalciferol, (DRISDOL) 1.25 MG (50000 UNIT) CAPS capsule Take 50,000 Units by mouth every 7 (seven) days.     cilostazol (PLETAL) 100 MG tablet Take 100 mg by mouth 2 (two) times daily. (Patient not taking: Reported on 02/14/2023)     cyclobenzaprine (FLEXERIL) 10 MG tablet Take 10 mg by mouth 3 (three) times daily as needed for muscle spasms. (Patient not taking: Reported on 02/14/2023)     naloxone [facility]) nasal spray 4 mg/0.1 mL Place 1 spray into the nose once. (Patient not taking: Reported on 02/14/2023)     NIFEdipine (ADALAT CC) 30 MG 24 hr tablet Take 30 mg by mouth daily. (Patient not taking: Reported on 02/14/2023)     potassium chloride (KLOR-CON) 10 MEQ tablet Take 10 mEq by mouth daily. (Patient not taking: Reported on 02/14/2023)     No current facility-administered medications for this visit.    Review of Systems General:  per HPI Skin: negative for changes in moles or sores or rash Eyes: negative for changes in vision HEENT: negative for change in hearing, tinnitus, voice changes Pulmonary: negative for dyspnea, orthopnea, productive cough, wheezing Cardiac: negative for palpitations, pain Gastrointestinal: negative for nausea, vomiting, constipation, diarrhea, hematemesis, hematochezia Genitourinary/Sexual: negative for dysuria, retention, hematuria, incontinence Ob/Gyn:  negative for abnormal bleeding, or pain Musculoskeletal: negative for pain, joint pain, back pain Hematology: negative for easy bruising, abnormal bleeding Neurologic/Psych: negative for headaches, seizures, paralysis, weakness, numbness  Objective:  Physical  Examination:  BP (!) 146/87   Pulse 72   Temp 97.8 F (36.6 C)   Resp 20   Wt 175 lb (79.4 kg)   SpO2 100%   BMI 34.18 kg/m     ECOG Performance Status: 2 - Symptomatic, <50% confined to bed  GENERAL: Patient is a well appearing female in no acute distress HEENT:  Sclera clear. Anicteric NODES:  Negative axillary, supraclavicular, inguinal lymph node survery LUNGS:  Clear to auscultation bilaterally.   HEART:  Regular rate and rhythm.  ABDOMEN:  Soft, nontender.  No hernias, incisions well healed. No masses or ascites EXTREMITIES:  No peripheral edema. Atraumatic. No cyanosis SKIN:  Clear with no obvious rashes or skin changes.  NEURO:  Nonfocal. Well oriented.  Appropriate affect.  Pelvic: Exam chaperoned by RN EGBUS: no lesions Cervix: no lesions, nontender, mobile Vagina: no lesions, no discharge or bleeding Uterus: normal size, nontender, mobile Adnexa: no palpable masses Rectovaginal: confirmatory  Lab Review Labs on site today: Lab Results  Component Value Date   WBC 5.5 03/02/2023   HGB 11.8 (L) 03/02/2023   HCT 35.5 (L) 03/02/2023   MCV 89.6 03/02/2023   PLT 265 03/02/2023     Chemistry      Component Value Date/Time   NA 134 (L) 03/02/2023 0821    K 4.0 03/02/2023 0821   CL 103 03/02/2023 0821   CO2 23 03/02/2023 0821   BUN 16 03/02/2023 0821   CREATININE 0.83 03/02/2023 0821      Component Value Date/Time   CALCIUM 8.7 (L) 03/02/2023 0821   ALKPHOS 599 (H) 03/02/2023 0821   AST 56 (H) 03/02/2023 0821   ALT 77 (H) 03/02/2023 0821   BILITOT 0.4 03/02/2023 1610       Assessment:  Sheryl Silva is a 47 y.o. 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  She has extensive nodal and skeletal disease.  Although it is possible that this cancer arose in the ovarian mass, this is unlikely in view of low level PET positivity and treatment would be the same regardless.  CEA is elevated to 33 with CA125 only 56, and this also more consistent with GI primary.   She has low level elevation of hCG which is likely due to ectopic secretion by the cancer or heterophile antibody.   Medical co-morbidities complicating care: HTN, obesity BMI 34.  Plan:   Problem List Items Addressed This Visit       Other   Signet ring cell adenocarcinoma (HCC)   Other Visit Diagnoses     Adnexal mass    -  Primary      We discussed options for management including FOLFOX chemotherapy with Keytruda per Dr Milinda Cave plan.  This was also recommended by Dr Rod Mae at [facility] GI med onc. She will start treatment today.  Also discussed that there is nothing to be gained by removing the left ovarian tumor in the presence of extensive skeletal and nodal metastases, as it is not large or causing symptoms.  If this changes in the future, we could reconsider the option of surgery.  The patient's diagnosis, an outline of the further diagnostic and laboratory studies which will be required, the recommendation for surgery, and alternatives were discussed with her and  her accompanying family members.  All questions were  answered to their satisfaction.  Alinda Dooms, NP  I personally interviewed and examined the patient. Agreed with the above/below plan of care. I have directly contributed to assessment and plan of care of this patient and educated and discussed with patient and family.  Leida Lauth, MD    CC:  [name], MD [address] [place],  [place] [address] [phone]

## 2023-03-02 NOTE — Progress Notes (Signed)
Slides have been received and taken to pathology for review.

## 2023-03-02 NOTE — Patient Instructions (Signed)
[facility]  Discharge Instructions: Thank you for choosing [facility] to provide your oncology and hematology care.  If you have a lab appointment with the [facility], please go directly to the [facility] and check in at the registration area.  Wear comfortable clothing and clothing appropriate for easy access to any Portacath or PICC line.   We strive to give you quality time with your provider. You may need to reschedule your appointment if you arrive late (15 or more minutes).  Arriving late affects you and other patients whose appointments are after yours.  Also, if you miss three or more appointments without notifying the office, you may be dismissed from the clinic at the provider's discretion.      For prescription refill requests, have your pharmacy contact our office and allow 72 hours for refills to be completed.    Today you received the following chemotherapy and/or immunotherapy agents KEYTRUDA, OXALIPLATIN, LEUCOVORIN, 5 FU      To help prevent nausea and vomiting after your treatment, we encourage you to take your nausea medication as directed.  BELOW ARE SYMPTOMS THAT SHOULD BE REPORTED IMMEDIATELY: *FEVER GREATER THAN 100.4 F (38 C) OR HIGHER *CHILLS OR SWEATING *NAUSEA AND VOMITING THAT IS NOT CONTROLLED WITH YOUR NAUSEA MEDICATION *UNUSUAL SHORTNESS OF BREATH *UNUSUAL BRUISING OR BLEEDING *URINARY PROBLEMS (pain or burning when urinating, or frequent urination) *BOWEL PROBLEMS (unusual diarrhea, constipation, pain near the anus) TENDERNESS IN MOUTH AND THROAT WITH OR WITHOUT PRESENCE OF ULCERS (sore throat, sores in mouth, or a toothache) UNUSUAL RASH, SWELLING OR PAIN  UNUSUAL VAGINAL DISCHARGE OR ITCHING   Items with * indicate a potential emergency and should be followed up as soon as possible or go to the Emergency Department if any problems should occur.  Please show the CHEMOTHERAPY ALERT CARD or  IMMUNOTHERAPY ALERT CARD at check-in to the Emergency Department and triage nurse.  Should you have questions after your visit or need to cancel or reschedule your appointment, please contact [name]  [phone] and follow the prompts.  Office hours are 8:00 a.m. to 4:30 p.m. Monday - Friday. Please note that voicemails left after 4:00 p.m. may not be returned until the following business day.  We are closed weekends and major holidays. You have access to a nurse at all times for urgent questions. Please call the main number to the clinic [phone] and follow the prompts.  For any non-urgent questions, you may also contact your provider using MyChart. We now offer e-Visits for anyone 7 and older to request care online for non-urgent symptoms. For details visit [email or web address].   Also download the MyChart app! Go to the app store, search "MyChart", open the app, select , and log in with your MyChart username and password.  Pembrolizumab Injection What is this medication? PEMBROLIZUMAB (PEM broe LIZ ue mab) treats some types of cancer. It works by helping your immune system slow or stop the spread of cancer cells. It is a monoclonal antibody. This medicine may be used for other purposes; ask your health care provider or pharmacist if you have questions. COMMON BRAND NAME(S): Keytruda What should I tell my care team before I take this medication? They need to know if you have any of these conditions: Allogeneic stem cell transplant (uses someone else's stem cells) Autoimmune diseases, such as Crohn disease, ulcerative colitis, lupus History of chest radiation Nervous system problems, such as  Guillain-Barre syndrome, myasthenia gravis Organ transplant An unusual or allergic reaction to pembrolizumab, other medications, foods, dyes, or preservatives Pregnant or trying to get pregnant Breast-feeding How should I use this  medication? This medication is injected into a vein. It is given by your care team in a hospital or clinic setting. A special MedGuide will be given to you before each treatment. Be sure to read this information carefully each time. Talk to your care team about the use of this medication in children. While it may be prescribed for children as young as 6 months for selected conditions, precautions do apply. Overdosage: If you think you have taken too much of this medicine contact a poison control [facility] or emergency room at once. NOTE: This medicine is only for you. Do not share this medicine with others. What if I miss a dose? Keep appointments for follow-up doses. It is important not to miss your dose. Call your care team if you are unable to keep an appointment. What may interact with this medication? Interactions have not been studied. This list may not describe all possible interactions. Give your health care provider a list of all the medicines, herbs, non-prescription drugs, or dietary supplements you use. Also tell them if you smoke, drink alcohol, or use illegal drugs. Some items may interact with your medicine. What should I watch for while using this medication? Your condition will be monitored carefully while you are receiving this medication. You may need blood work while taking this medication. This medication may cause serious skin reactions. They can happen weeks to months after starting the medication. Contact your care team right away if you notice fevers or flu-like symptoms with a rash. The rash may be red or purple and then turn into blisters or peeling of the skin. You may also notice a red rash with swelling of the face, lips, or lymph nodes in your neck or under your arms. Tell your care team right away if you have any change in your eyesight. Talk to your care team if you may be pregnant. Serious birth defects can occur if you take this medication during pregnancy and for 4  months after the last dose. You will need a negative pregnancy test before starting this medication. Contraception is recommended while taking this medication and for 4 months after the last dose. Your care team can help you find the option that works for you. Do not breastfeed while taking this medication and for 4 months after the last dose. What side effects may I notice from receiving this medication? Side effects that you should report to your care team as soon as possible: Allergic reactions--skin rash, itching, hives, swelling of the face, lips, tongue, or throat Dry cough, shortness of breath or trouble breathing Eye pain, redness, irritation, or discharge with blurry or decreased vision Heart muscle inflammation--unusual weakness or fatigue, shortness of breath, chest pain, fast or irregular heartbeat, dizziness, swelling of the ankles, feet, or hands Hormone gland problems--headache, sensitivity to light, unusual weakness or fatigue, dizziness, fast or irregular heartbeat, increased sensitivity to cold or heat, excessive sweating, constipation, hair loss, increased thirst or amount of urine, tremors or shaking, irritability Infusion reactions--chest pain, shortness of breath or trouble breathing, feeling faint or lightheaded Kidney injury (glomerulonephritis)--decrease in the amount of urine, red or dark Kleven urine, foamy or bubbly urine, swelling of the ankles, hands, or feet Liver injury--right upper belly pain, loss of appetite, nausea, light-colored stool, dark yellow or [place] urine, yellowing skin  or eyes, unusual weakness or fatigue Pain, tingling, or numbness in the hands or feet, muscle weakness, change in vision, confusion or trouble speaking, loss of balance or coordination, trouble walking, seizures Rash, fever, and swollen lymph nodes Redness, blistering, peeling, or loosening of the skin, including inside the mouth Sudden or severe stomach pain, bloody diarrhea, fever, nausea,  vomiting Side effects that usually do not require medical attention (report to your care team if they continue or are bothersome): Bone, joint, or muscle pain Diarrhea Fatigue Loss of appetite Nausea Skin rash This list may not describe all possible side effects. Call your doctor for medical advice about side effects. You may report side effects to FDA at 1-800-FDA-1088. Where should I keep my medication? This medication is given in a hospital or clinic. It will not be stored at home. NOTE: This sheet is a summary. It may not cover all possible information. If you have questions about this medicine, talk to your doctor, pharmacist, or health care provider.  2023 Elsevier/Gold Standard (2022-03-02 00:00:00)  Oxaliplatin Injection What is this medication? OXALIPLATIN (ox AL i PLA tin) treats colorectal cancer. It works by slowing down the growth of cancer cells. This medicine may be used for other purposes; ask your health care provider or pharmacist if you have questions. COMMON BRAND NAME(S): Eloxatin What should I tell my care team before I take this medication? They need to know if you have any of these conditions: Heart disease History of irregular heartbeat or rhythm Liver disease Low blood cell levels (white cells, red cells, and platelets) Lung or breathing disease, such as asthma Take medications that treat or prevent blood clots Tingling of the fingers, toes, or other nerve disorder An unusual or allergic reaction to oxaliplatin, other medications, foods, dyes, or preservatives If you or your partner are pregnant or trying to get pregnant Breast-feeding How should I use this medication? This medication is injected into a vein. It is given by your care team in a hospital or clinic setting. Talk to your care team about the use of this medication in children. Special care may be needed. Overdosage: If you think you have taken too much of this medicine contact a poison control  [facility] or emergency room at once. NOTE: This medicine is only for you. Do not share this medicine with others. What if I miss a dose? Keep appointments for follow-up doses. It is important not to miss a dose. Call your care team if you are unable to keep an appointment. What may interact with this medication? Do not take this medication with any of the following: Cisapride Dronedarone Pimozide Thioridazine This medication may also interact with the following: Aspirin and aspirin-like medications Certain medications that treat or prevent blood clots, such as warfarin, apixaban, dabigatran, and rivaroxaban Cisplatin Cyclosporine Diuretics Medications for infection, such as acyclovir, adefovir, amphotericin B, bacitracin, cidofovir, foscarnet, ganciclovir, gentamicin, pentamidine, vancomycin NSAIDs, medications for pain and inflammation, such as ibuprofen or naproxen Other medications that cause heart rhythm changes Pamidronate Zoledronic acid This list may not describe all possible interactions. Give your health care provider a list of all the medicines, herbs, non-prescription drugs, or dietary supplements you use. Also tell them if you smoke, drink alcohol, or use illegal drugs. Some items may interact with your medicine. What should I watch for while using this medication? Your condition will be monitored carefully while you are receiving this medication. You may need blood work while taking this medication. This medication may make you  feel generally unwell. This is not uncommon as chemotherapy can affect healthy cells as well as cancer cells. Report any side effects. Continue your course of treatment even though you feel ill unless your care team tells you to stop. This medication may increase your risk of getting an infection. Call your care team for advice if you get a fever, chills, sore throat, or other symptoms of a cold or flu. Do not treat yourself. Try to avoid being around  people who are sick. Avoid taking medications that contain aspirin, acetaminophen, ibuprofen, naproxen, or ketoprofen unless instructed by your care team. These medications may hide a fever. Be careful brushing or flossing your teeth or using a toothpick because you may get an infection or bleed more easily. If you have any dental work done, tell your dentist you are receiving this medication. This medication can make you more sensitive to cold. Do not drink cold drinks or use ice. Cover exposed skin before coming in contact with cold temperatures or cold objects. When out in cold weather wear warm clothing and cover your mouth and nose to warm the air that goes into your lungs. Tell your care team if you get sensitive to the cold. Talk to your care team if you or your partner are pregnant or think either of you might be pregnant. This medication can cause serious birth defects if taken during pregnancy and for 9 months after the last dose. A negative pregnancy test is required before starting this medication. A reliable form of contraception is recommended while taking this medication and for 9 months after the last dose. Talk to your care team about effective forms of contraception. Do not father a child while taking this medication and for 6 months after the last dose. Use a condom while having sex during this time period. Do not breastfeed while taking this medication and for 3 months after the last dose. This medication may cause infertility. Talk to your care team if you are concerned about your fertility. What side effects may I notice from receiving this medication? Side effects that you should report to your care team as soon as possible: Allergic reactions--skin rash, itching, hives, swelling of the face, lips, tongue, or throat Bleeding--bloody or black, tar-like stools, vomiting blood or [place] material that looks like coffee grounds, red or dark Brines urine, small red or purple spots on skin,  unusual bruising or bleeding Dry cough, shortness of breath or trouble breathing Heart rhythm changes--fast or irregular heartbeat, dizziness, feeling faint or lightheaded, chest pain, trouble breathing Infection--fever, chills, cough, sore throat, wounds that don't heal, pain or trouble when passing urine, general feeling of discomfort or being unwell Liver injury--right upper belly pain, loss of appetite, nausea, light-colored stool, dark yellow or [place] urine, yellowing skin or eyes, unusual weakness or fatigue Low red blood cell level--unusual weakness or fatigue, dizziness, headache, trouble breathing Muscle injury--unusual weakness or fatigue, muscle pain, dark yellow or Shedd urine, decrease in amount of urine Pain, tingling, or numbness in the hands or feet Sudden and severe headache, confusion, change in vision, seizures, which may be signs of posterior reversible encephalopathy syndrome (PRES) Unusual bruising or bleeding Side effects that usually do not require medical attention (report to your care team if they continue or are bothersome): Diarrhea Nausea Pain, redness, or swelling with sores inside the mouth or throat Unusual weakness or fatigue Vomiting This list may not describe all possible side effects. Call your doctor for medical advice about side  effects. You may report side effects to FDA at 1-800-FDA-1088. Where should I keep my medication? This medication is given in a hospital or clinic. It will not be stored at home. NOTE: This sheet is a summary. It may not cover all possible information. If you have questions about this medicine, talk to your doctor, pharmacist, or health care provider.  2023 Elsevier/Gold Standard (2007-12-09 00:00:00)  Leucovorin Injection What is this medication? LEUCOVORIN (loo koe VOR in) prevents side effects from certain medications, such as methotrexate. It works by increasing folate levels. This helps protect healthy cells in your body. It  may also be used to treat anemia caused by low levels of folate. It can also be used with fluorouracil, a type of chemotherapy, to treat colorectal cancer. It works by increasing the effects of fluorouracil in the body. This medicine may be used for other purposes; ask your health care provider or pharmacist if you have questions. What should I tell my care team before I take this medication? They need to know if you have any of these conditions: Anemia from low levels of vitamin B12 in the blood An unusual or allergic reaction to leucovorin, folic acid, other medications, foods, dyes, or preservatives Pregnant or trying to get pregnant Breastfeeding How should I use this medication? This medication is injected into a vein or a muscle. It is given by your care team in a hospital or clinic setting. Talk to your care team about the use of this medication in children. Special care may be needed. Overdosage: If you think you have taken too much of this medicine contact a poison control [facility] or emergency room at once. NOTE: This medicine is only for you. Do not share this medicine with others. What if I miss a dose? Keep appointments for follow-up doses. It is important not to miss your dose. Call your care team if you are unable to keep an appointment. What may interact with this medication? Capecitabine Fluorouracil Phenobarbital Phenytoin Primidone Trimethoprim;sulfamethoxazole This list may not describe all possible interactions. Give your health care provider a list of all the medicines, herbs, non-prescription drugs, or dietary supplements you use. Also tell them if you smoke, drink alcohol, or use illegal drugs. Some items may interact with your medicine. What should I watch for while using this medication? Your condition will be monitored carefully while you are receiving this medication. This medication may increase the side effects of 5-fluorouracil. Tell your care team if you have  diarrhea or mouth sores that do not get better or that get worse. What side effects may I notice from receiving this medication? Side effects that you should report to your care team as soon as possible: Allergic reactions--skin rash, itching, hives, swelling of the face, lips, tongue, or throat This list may not describe all possible side effects. Call your doctor for medical advice about side effects. You may report side effects to FDA at 1-800-FDA-1088. Where should I keep my medication? This medication is given in a hospital or clinic. It will not be stored at home. NOTE: This sheet is a summary. It may not cover all possible information. If you have questions about this medicine, talk to your doctor, pharmacist, or health care provider.  2023 Elsevier/Gold Standard (2022-02-26 00:00:00)  Fluorouracil Injection What is this medication? FLUOROURACIL (flure oh YOOR a sil) treats some types of cancer. It works by slowing down the growth of cancer cells. This medicine may be used for other purposes; ask your health  care provider or pharmacist if you have questions. COMMON BRAND NAME(S): Adrucil What should I tell my care team before I take this medication? They need to know if you have any of these conditions: Blood disorders Dihydropyrimidine dehydrogenase (DPD) deficiency Infection, such as chickenpox, cold sores, herpes Kidney disease Liver disease Poor nutrition Recent or ongoing radiation therapy An unusual or allergic reaction to fluorouracil, other medications, foods, dyes, or preservatives If you or your partner are pregnant or trying to get pregnant Breast-feeding How should I use this medication? This medication is injected into a vein. It is administered by your care team in a hospital or clinic setting. Talk to your care team about the use of this medication in children. Special care may be needed. Overdosage: If you think you have taken too much of this medicine contact a  poison control [facility] or emergency room at once. NOTE: This medicine is only for you. Do not share this medicine with others. What if I miss a dose? Keep appointments for follow-up doses. It is important not to miss your dose. Call your care team if you are unable to keep an appointment. What may interact with this medication? Do not take this medication with any of the following: Live virus vaccines This medication may also interact with the following: Medications that treat or prevent blood clots, such as warfarin, enoxaparin, dalteparin This list may not describe all possible interactions. Give your health care provider a list of all the medicines, herbs, non-prescription drugs, or dietary supplements you use. Also tell them if you smoke, drink alcohol, or use illegal drugs. Some items may interact with your medicine. What should I watch for while using this medication? Your condition will be monitored carefully while you are receiving this medication. This medication may make you feel generally unwell. This is not uncommon as chemotherapy can affect healthy cells as well as cancer cells. Report any side effects. Continue your course of treatment even though you feel ill unless your care team tells you to stop. In some cases, you may be given additional medications to help with side effects. Follow all directions for their use. This medication may increase your risk of getting an infection. Call your care team for advice if you get a fever, chills, sore throat, or other symptoms of a cold or flu. Do not treat yourself. Try to avoid being around people who are sick. This medication may increase your risk to bruise or bleed. Call your care team if you notice any unusual bleeding. Be careful brushing or flossing your teeth or using a toothpick because you may get an infection or bleed more easily. If you have any dental work done, tell your dentist you are receiving this medication. Avoid taking  medications that contain aspirin, acetaminophen, ibuprofen, naproxen, or ketoprofen unless instructed by your care team. These medications may hide a fever. Do not treat diarrhea with over the counter products. Contact your care team if you have diarrhea that lasts more than 2 days or if it is severe and watery. This medication can make you more sensitive to the sun. Keep out of the sun. If you cannot avoid being in the sun, wear protective clothing and sunscreen. Do not use sun lamps, tanning beds, or tanning booths. Talk to your care team if you or your partner wish to become pregnant or think you might be pregnant. This medication can cause serious birth defects if taken during pregnancy and for 3 months after the last dose.  A reliable form of contraception is recommended while taking this medication and for 3 months after the last dose. Talk to your care team about effective forms of contraception. Do not father a child while taking this medication and for 3 months after the last dose. Use a condom while having sex during this time period. Do not breastfeed while taking this medication. This medication may cause infertility. Talk to your care team if you are concerned about your fertility. What side effects may I notice from receiving this medication? Side effects that you should report to your care team as soon as possible: Allergic reactions--skin rash, itching, hives, swelling of the face, lips, tongue, or throat Heart attack--pain or tightness in the chest, shoulders, arms, or jaw, nausea, shortness of breath, cold or clammy skin, feeling faint or lightheaded Heart failure--shortness of breath, swelling of the ankles, feet, or hands, sudden weight gain, unusual weakness or fatigue Heart rhythm changes--fast or irregular heartbeat, dizziness, feeling faint or lightheaded, chest pain, trouble breathing High ammonia level--unusual weakness or fatigue, confusion, loss of appetite, nausea, vomiting,  seizures Infection--fever, chills, cough, sore throat, wounds that don't heal, pain or trouble when passing urine, general feeling of discomfort or being unwell Low red blood cell level--unusual weakness or fatigue, dizziness, headache, trouble breathing Pain, tingling, or numbness in the hands or feet, muscle weakness, change in vision, confusion or trouble speaking, loss of balance or coordination, trouble walking, seizures Redness, swelling, and blistering of the skin over hands and feet Severe or prolonged diarrhea Unusual bruising or bleeding Side effects that usually do not require medical attention (report to your care team if they continue or are bothersome): Dry skin Headache Increased tears Nausea Pain, redness, or swelling with sores inside the mouth or throat Sensitivity to light Vomiting This list may not describe all possible side effects. Call your doctor for medical advice about side effects. You may report side effects to FDA at 1-800-FDA-1088. Where should I keep my medication? This medication is given in a hospital or clinic. It will not be stored at home. NOTE: This sheet is a summary. It may not cover all possible information. If you have questions about this medicine, talk to your doctor, pharmacist, or health care provider.  2023 Elsevier/Gold Standard (2022-02-16 00:00:00)

## 2023-03-02 NOTE — Progress Notes (Signed)
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  INTERVAL HISTORY: Patient returns to clinic today for further evaluation and initiation of cycle 1 of FOLFOX plus Keytruda.  She continues to have pain and weight loss, but otherwise feels well. She has no neurologic complaints.  She denies any recent fevers or illnesses.  She has no chest pain, shortness of breath, cough, or hemoptysis.  She denies any nausea, vomiting, constipation, or diarrhea.  She has no urinary complaints.  Patient offers no further specific complaints today.  REVIEW OF SYSTEMS:   Review of Systems  Constitutional:  Positive for malaise/fatigue and weight loss. Negative for fever.  Respiratory: Negative.  Negative for cough, hemoptysis and shortness of breath.   Cardiovascular: Negative.  Negative for chest pain and leg swelling.  Gastrointestinal: Negative.  Negative for abdominal pain.  Genitourinary: Negative.  Negative for dysuria.  Musculoskeletal:  Positive for back pain.  Skin: Negative.  Negative for rash.  Neurological: Negative.  Negative for dizziness, focal weakness, weakness and headaches.  Psychiatric/Behavioral: Negative.  The patient is not nervous/anxious.     As per HPI. Otherwise, a complete review of systems is negative.  PAST MEDICAL HISTORY: Past Medical History:  Diagnosis Date   Anxiety    Cancer (HCC)    Depression    Heart murmur    Hypertension     PAST SURGICAL HISTORY: Past Surgical History:  Procedure Laterality Date   CESAREAN SECTION     COLONOSCOPY WITH PROPOFOL N/A 02/21/2023    Procedure: COLONOSCOPY WITH PROPOFOL;  Surgeon: Regis Bill, MD;  Location: [facility] ENDOSCOPY;  Service: Endoscopy;  Laterality: N/A;   DILATION AND CURETTAGE OF UTERUS     IR IMAGING GUIDED PORT INSERTION  02/16/2023    FAMILY HISTORY: Family History  Problem Relation Age of Onset   Cancer Maternal Grandmother     ADVANCED DIRECTIVES (Y/N):  N  HEALTH MAINTENANCE: Social History   Tobacco Use   Smoking status: Former    Packs/day: .1    Types: Cigarettes    Quit date: 12/19/2022    Years since quitting: 0.2   Smokeless tobacco: Never   Tobacco comments:    Quit smoking 12/2022  Vaping Use   Vaping Use: Never used  Substance Use Topics   Alcohol use: Yes    Comment: occ   Drug use: No     Colonoscopy:  PAP:  Bone density:  Lipid panel:  Allergies  Allergen Reactions   Nifedipine Palpitations    Current Outpatient Medications  Medication Sig Dispense Refill   albuterol (PROVENTIL) (2.5 MG/3ML) 0.083% nebulizer solution Take 2.5 mg by nebulization every 6 (six) hours as needed for wheezing or shortness of breath.     cetirizine (ZYRTEC ALLERGY) 10 MG tablet Take 1 tablet (10 mg total) by mouth daily. 30 tablet 0   cilostazol (PLETAL) 100 MG tablet Take 100 mg by mouth 2 (two) times daily. (Patient not taking: Reported on 02/14/2023)     cyanocobalamin (VITAMIN B12) 500 MCG tablet Take 500 mcg by mouth daily.     cyclobenzaprine (FLEXERIL) 10 MG tablet Take 10 mg by mouth 3 (three) times daily  as needed for muscle spasms. (Patient not taking: Reported on 02/14/2023)     DULoxetine (CYMBALTA) 30 MG capsule Take 30 mg by mouth daily.     gabapentin (NEURONTIN) 300 MG capsule Take 300 mg by mouth 3 (three) times daily.     lidocaine-prilocaine (EMLA) cream Apply to affected area once 30 g 3   Multiple Vitamin (MULTIVITAMIN) tablet Take 1 tablet by mouth daily.     naloxone (NARCAN) nasal spray 4 mg/0.1 mL Place 1 spray into the nose once. (Patient not taking: Reported  on 02/14/2023)     NIFEdipine (ADALAT CC) 30 MG 24 hr tablet Take 30 mg by mouth daily. (Patient not taking: Reported on 02/14/2023)     omeprazole (PRILOSEC) 20 MG capsule Take 20 mg by mouth daily.     ondansetron (ZOFRAN) 8 MG tablet Take 1 tablet (8 mg total) by mouth every 8 (eight) hours as needed for nausea or vomiting. Start on the third day after chemotherapy. 60 tablet 2   ondansetron (ZOFRAN-ODT) 4 MG disintegrating tablet Take 4 mg by mouth 2 (two) times daily as needed for nausea or vomiting.     oxyCODONE-acetaminophen (PERCOCET/ROXICET) 5-325 MG tablet Take 1 tablet by mouth 2 (two) times daily.     polyethylene glycol (MIRALAX / GLYCOLAX) 17 g packet Take 17 g by mouth daily.     potassium chloride (KLOR-CON) 10 MEQ tablet Take 10 mEq by mouth daily. (Patient not taking: Reported on 02/14/2023)     prochlorperazine (COMPAZINE) 10 MG tablet Take 1 tablet (10 mg total) by mouth every 6 (six) hours as needed for nausea or vomiting. 60 tablet 2   senna (SENOKOT) 8.6 MG tablet Take 2 tablets by mouth daily.     tiZANidine (ZANAFLEX) 2 MG tablet Take 2 mg by mouth at bedtime.     traMADol (ULTRAM-ER) 100 MG 24 hr tablet Take 100 mg by mouth daily.     Vitamin D, Ergocalciferol, (DRISDOL) 1.25 MG (50000 UNIT) CAPS capsule Take 50,000 Units by mouth every 7 (seven) days.     No current facility-administered medications for this visit.   Facility-Administered Medications Ordered in Other Visits  Medication Dose Route Frequency Provider Last Rate Last Admin   fluorouracil (ADRUCIL) 4,500 mg in sodium chloride 0.9 % 60 mL chemo infusion  2,400 mg/m2 (Treatment Plan Recorded) Intravenous 1 day or 1 dose Orlie Dakin, Tollie Pizza, MD       fluorouracil (ADRUCIL) chemo injection 750 mg  400 mg/m2 (Treatment Plan Recorded) Intravenous Once Jeralyn Ruths, MD       leucovorin 748 mg in dextrose 5 % 250 mL infusion  400 mg/m2 (Treatment Plan Recorded) Intravenous Once Jeralyn Ruths, MD 144 mL/hr  at 03/02/23 1111 748 mg at 03/02/23 1111   oxaliplatin (ELOXATIN) 150 mg in dextrose 5 % 500 mL chemo infusion  85 mg/m2 (Treatment Plan Recorded) Intravenous Once Jeralyn Ruths, MD 265 mL/hr at 03/02/23 1113 150 mg at 03/02/23 1113    OBJECTIVE: There were no vitals filed for this visit.    There is no height or weight on file to calculate BMI.    ECOG FS:1 - Symptomatic but completely ambulatory  General: Well-developed, well-nourished, no acute distress.  Sitting in a wheelchair. Eyes: Pink conjunctiva, anicteric sclera. HEENT: Normocephalic, moist mucous membranes. Lungs: No audible wheezing or coughing. Heart: Regular rate and rhythm. Abdomen: Soft, nontender, no obvious distention. Musculoskeletal: No edema, cyanosis, or clubbing. Neuro: Alert, answering all questions appropriately. Cranial nerves  grossly intact. Skin: No rashes or petechiae noted. Psych: Normal affect.  LAB RESULTS:  Lab Results  Component Value Date   NA 134 (L) 03/02/2023   K 4.0 03/02/2023   CL 103 03/02/2023   CO2 23 03/02/2023   GLUCOSE 89 03/02/2023   BUN 16 03/02/2023   CREATININE 0.83 03/02/2023   CALCIUM 8.7 (L) 03/02/2023   PROT 7.2 03/02/2023   ALBUMIN 3.7 03/02/2023   AST 56 (H) 03/02/2023   ALT 77 (H) 03/02/2023   ALKPHOS 599 (H) 03/02/2023   BILITOT 0.4 03/02/2023   GFRNONAA >60 03/02/2023   GFRAA >60 09/27/2018    Lab Results  Component Value Date   WBC 5.5 03/02/2023   NEUTROABS 3.4 03/02/2023   HGB 11.8 (L) 03/02/2023   HCT 35.5 (L) 03/02/2023   MCV 89.6 03/02/2023   PLT 265 03/02/2023     STUDIES: NM PET Image Initial (PI) Skull Base To Thigh  Result Date: 02/24/2023 CLINICAL DATA:  Subsequent treatment strategy for adenocarcinoma of unknown origin. EXAM: NUCLEAR MEDICINE PET SKULL BASE TO THIGH TECHNIQUE: 9.41 mCi F-18 FDG was injected intravenously. Full-ring PET imaging was performed from the skull base to thigh after the radiotracer. CT data was obtained and  used for attenuation correction and anatomic localization. Fasting blood glucose: 89 mg/dl COMPARISON:  Outside PET-CT from [facility] dated 12/22/2022 FINDINGS: Mediastinal blood pool activity: SUV max 2.39 Liver activity: SUV max NA NECK: Multi station hypermetabolic cervical lymphadenopathy. SUV max in right level 2 node has an SUV max of 8.07. Left-sided posterior cervical lymph node has an SUV max of 6.0 Incidental CT findings: None CHEST: Hypermetabolic axillary and mediastinal lymph nodes. 9 mm node in the left axilla on image 47/4 has an SUV max of 6.77. Right axillary node measuring 10 mm on image 46/4 has an SUV max of 5.79. 9 mm prevascular node has an SUV max of 5.12. Precarinal node has an SUV max of 4.10. No worrisome pulmonary nodules. No hypermetabolic breast lesions are identified. Incidental CT findings: Right IJ Port-A-Cath in good position. ABDOMEN/PELVIS: 9 mm celiac axis node has an SUV max of 8.19. 12 mm para duodenal node has an SUV max of 4.67. Bilateral retroperitoneal hypermetabolic adenopathy. Inter aortocaval node has an SUV max of 6.98. Left para-aortic lymph nodes with SUV max of 6.17. No hepatic, splenic, pancreatic or adrenal gland lesions. No hypermetabolic bowel lesions are identified. No obstructive findings. Incidental CT findings: Cholelithiasis noted. Age advanced atherosclerotic calcifications involving the abdominal aorta and iliac arteries. Left adnexal adnexal lesion measuring 4.6 x 4.4 cm. Part of this is a simple cyst but other parts are solid and demonstrate low level hypermetabolism with SUV max of 3.62. Interval increase in size when compared to the prior CT scan from 12/18/2022. The right ovary is normal. The uterus is unremarkable. SKELETON: Diffuse hypermetabolic skeletal metastatic disease. This is largely sclerotic disease although there are some areas of mixed lytic and sclerotic changes. There is diffuse involvement of the axial and appendicular skeleton. Index  lesions: Left proximal humerus SUV max 6.20, C2 SUV max 5.63, left manubrial sternum SUV max 7.08, T11 7.75, L5 8.87, left iliac bone 9.47, intertrochanteric region of the right hip 9.25. Incidental CT findings: No findings for spinal canal compromise. There is some cortical irregularity/erosive change involving the right hip lesion. IMPRESSION: 1. Diffuse hypermetabolic skeletal metastatic disease. 2. Hypermetabolic cervical, axillary, mediastinal, upper abdominal and retroperitoneal adenopathy. 3. Enlarging left adnexal lesion with low level hypermetabolism. No other definite  primary neoplastic site is identified. 4. Age advanced atherosclerotic calcifications involving the abdominal aorta and iliac arteries. 5. Aortic atherosclerosis. Aortic Atherosclerosis (ICD10-I70.0). Electronically Signed   By: Rudie Meyer M.D.   On: 02/24/2023 15:52   US PELVIC COMPLETE WITH TRANSVAGINAL  Result Date: 02/24/2023 CLINICAL DATA:  Positive pregnancy test. Thickened endometrial stripe. Quantitative beta HCG was 53.1 on 02/08/2023. EXAM: TRANSABDOMINAL AND TRANSVAGINAL ULTRASOUND OF PELVIS TECHNIQUE: Both transabdominal and transvaginal ultrasound examinations of the pelvis were performed. Transabdominal technique was performed for global imaging of the pelvis including uterus, ovaries, adnexal regions, and pelvic cul-de-sac. It was necessary to proceed with endovaginal exam following the transabdominal exam to visualize the uterus, endometrium ovaries, and adnexal regions. COMPARISON:  02/04/2023 ultrasound and 02/03/2023 CT of the abdomen and pelvis from [facility] FINDINGS: Uterus Measurements: 7.6 x 3.9 x 4.0 centimeters = volume: 61 mL. Uterus is anteverted. There is no intrauterine gestational sac. Small posterior intramural fibroid is 1.4 x 0.9 x 1.3 centimeters. Nabothian cyst in the UPPER cervix is 1.9 centimeters. Endometrium Thickness: 6.7 millimeters.  No focal abnormality visualized. Right ovary  Measurements: 2.2 x 1.4 x 1.7 centimeters = volume: 3.0 mL. Normal appearance/no adnexal mass. Left ovary Measurements: A normal LEFT ovary is not identified. Within the LEFT adnexal region there is a solid hypervascular mass measuring 4.8 x 4.6 x 4.3 centimeters. Other findings Small moderate free pelvic fluid. IMPRESSION: 1. No ultrasound evidence for intrauterine pregnancy. Early intrauterine or adnexal pregnancy cannot be entirely excluded. 2. Normal appearance of the RIGHT ovary. 3. Solid hypervascular mass in the LEFT adnexal region is 4.8 centimeters. Findings are suspicious for ovarian metastasis or primary ovarian neoplasm. Electronically Signed   By: Norva Pavlov M.D.   On: 02/24/2023 14:15   IR IMAGING GUIDED PORT INSERTION  Result Date: 02/16/2023 INDICATION: chemotherapy administration EXAM: Chest port placement using ultrasound and fluoroscopic guidance MEDICATIONS: Documented in the EMR ANESTHESIA/SEDATION: Moderate (conscious) sedation was employed during this procedure. A total of Versed 2 mg and Fentanyl 125 mcg was administered intravenously. Moderate Sedation Time: 36 minutes. The patient's level of consciousness and vital signs were monitored continuously by radiology nursing throughout the procedure under my direct supervision. FLUOROSCOPY TIME:  Fluoroscopy Time: 0.8 minutes (4 mGy) COMPLICATIONS: None immediate. PROCEDURE: Informed written consent was obtained from the patient after a thorough discussion of the procedural risks, benefits and alternatives. All questions were addressed. Maximal Sterile Barrier Technique was utilized including caps, mask, sterile gowns, sterile gloves, sterile drape, hand hygiene and skin antiseptic. A timeout was performed prior to the initiation of the procedure. The patient was placed supine on the exam table. The right neck and chest was prepped and draped in the standard sterile fashion. A preliminary ultrasound of the right neck was performed and  demonstrates a patent right internal jugular vein. A permanent ultrasound image was stored in the electronic medical record. The overlying skin was anesthetized with 1% Lidocaine. Using ultrasound guidance, access was obtained into the right internal jugular vein using a 21 gauge micropuncture set. A wire was advanced into the SVC, a short incision was made at the puncture site, and serial dilatation performed. Next, in an ipsilateral infraclavicular location, an incision was made at the site of the subcutaneous reservoir. Blunt dissection was used to open a pocket to contain the reservoir. A subcutaneous tunnel was then created from the port site to the puncture site. A(n) 8 Fr single lumen catheter was advanced through the tunnel. The catheter was attached  to the port and this was placed in the subcutaneous pocket. Under fluoroscopic guidance, a peel away sheath was placed, and the catheter was trimmed to the appropriate length and was advanced into the central veins. The catheter length is 23 cm. The tip of the catheter lies near the superior cavoatrial junction. The port flushes and aspirates appropriately. The port was flushed and locked with heparinized saline. The port pocket was closed in 2 layers using 3-0 and 4-0 Vicryl/absorbable suture. Dermabond was also applied to both incisions. The patient tolerated the procedure well and was transferred to recovery in stable condition. IMPRESSION: Successful placement of a right-sided chest port via the right internal jugular vein. The port is ready for immediate use. Electronically Signed   By: Olive Bass M.D.   On: 02/16/2023 15:16   CT OUTSIDE FILMS CHEST  Result Date: 02/15/2023 This examination belongs to an outside facility and is stored here for comparison purposes only.  Contact the originating outside institution for any associated report or interpretation.   ONCOLOGY: Patient's workup was initiated at [facility] in February 2024 at which  point PET scan revealed widely metastatic disease with innumerable sclerotic bony lesions as well as axillary, mediastinal, and abdominal lymphadenopathy.  Subsequent biopsy of a right axillary lymph node revealed signet ring adenocarcinoma, likely GI origin.  Additional testing revealed high tumor mutational burden.  Further workup at [facility] in April 2024 revealed a negative EGD, and negative CT scanning of the brain.  CT scan of the abdomen and follow-up pelvic ultrasound revealed a large left adnexal mass that was not commented on PET scan from February.  CA 19-9 is normal, but CA125 and CEA are mildly elevated.    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  Repeat PET scan on February 24, 2023 reviewed independently confirming widespread metastatic disease.  Left adnexal mass is now PET positive of unclear clinical significance and after evaluation by gyn-onc, ultimately would not change the overall treatment plan.  Patient will receive FOLFOX plus Keytruda every 3 weeks for 12 cycles followed by maintenance Keytruda for at least 2 years.  Patient has had port placed.  Proceed with cycle 1 of treatment today.  Patient is returning to [place] in between treatments, therefore she will return to clinic in 2 days for pump removal and then in 2 weeks for further evaluation and consideration of cycle 2.  Will also add Zometa in with even number treatments.  Adnexal mass: PET scan as above.  I have requested pathology from outside biopsy for further evaluation.  Appreciate gyn-onc input. Pain: Patient reports that she is seen in the pain clinic in [place], [place].  Can consider radiation oncology in the future if necessary. Bony disease: Zometa as above. Transaminitis: Mild, monitor. Anemia: Mild, monitor.  Patient expressed understanding and was  in agreement with this plan. She also understands that She can call clinic at any time with any questions, concerns, or complaints.    Cancer Staging  Signet ring cell adenocarcinoma [facility]) Staging form: Exocrine Pancreas, AJCC 8th Edition - Clinical stage from 02/07/2023: Stage IV (cTX, cNX, pM1) - Signed by Jeralyn Ruths, MD on 02/14/2023 Stage prefix: Initial diagnosis   Jeralyn Ruths, MD   03/02/2023 12:24 PM

## 2023-03-03 ENCOUNTER — Telehealth: Payer: Self-pay

## 2023-03-03 LAB — SLIDE CONSULT, PATHOLOGY ARMC

## 2023-03-03 NOTE — Progress Notes (Signed)
Enrolled [employer] into the [employer] and [employer].

## 2023-03-03 NOTE — Telephone Encounter (Signed)
Telephone call to patient for follow up after receiving first infusion.   Patient states infusion went great.  States eating good and drinking plenty of fluids.   Denies any nausea or vomiting.  Encouraged patient to call for any concerns or questions. 

## 2023-03-04 ENCOUNTER — Inpatient Hospital Stay: Payer: 59

## 2023-03-04 VITALS — BP 143/93 | HR 74 | Resp 18

## 2023-03-04 DIAGNOSIS — C801 Malignant (primary) neoplasm, unspecified: Secondary | ICD-10-CM

## 2023-03-04 DIAGNOSIS — Z5112 Encounter for antineoplastic immunotherapy: Secondary | ICD-10-CM | POA: Diagnosis not present

## 2023-03-04 LAB — T4: T4, Total: 7.9 ug/dL (ref 4.5–12.0)

## 2023-03-04 MED ORDER — HEPARIN SOD (PORK) LOCK FLUSH 100 UNIT/ML IV SOLN
500.0000 [IU] | Freq: Once | INTRAVENOUS | Status: AC | PRN
  Administered 2023-03-04: 500 [IU]
  Filled 2023-03-04: qty 5

## 2023-03-04 MED ORDER — SODIUM CHLORIDE 0.9% FLUSH
10.0000 mL | INTRAVENOUS | Status: DC | PRN
  Administered 2023-03-04: 10 mL
  Filled 2023-03-04: qty 10

## 2023-03-09 ENCOUNTER — Telehealth: Payer: Self-pay

## 2023-03-09 NOTE — Telephone Encounter (Signed)
Received a call from Ms. Rockwell with complaints of increasing back pain and decreased appetite. I have encouraged her to see palliative care and she now states she is ready. In the meantime, I have encouraged her to reach out to triage for her symptoms. Scheduling notified to arrange palliative care visit.

## 2023-03-10 ENCOUNTER — Telehealth: Payer: Self-pay | Admitting: *Deleted

## 2023-03-10 ENCOUNTER — Inpatient Hospital Stay (HOSPITAL_BASED_OUTPATIENT_CLINIC_OR_DEPARTMENT_OTHER): Payer: 59 | Admitting: Hospice and Palliative Medicine

## 2023-03-10 DIAGNOSIS — C801 Malignant (primary) neoplasm, unspecified: Secondary | ICD-10-CM | POA: Diagnosis not present

## 2023-03-10 NOTE — Telephone Encounter (Signed)
Per Sheryl Ditty, NP- RN contacted [facility] [phone]. Left msg with staff from Dr. Meryl Crutch office (pain mgmt). I explained that pt is currently out of town and experiencing worsening back pain. She is currently followed by pain mgmt and presumably under a pain contract. Pt taking her percocet every 4 hours for her back pain (which is different than prescribed by pain mgmt). Pt noted improvement in symptoms. Staff will let Dr. Chriss Silva know and they will contact the patient directly with their recommendations.

## 2023-03-10 NOTE — Progress Notes (Signed)
Virtual Visit via Telephone Note  I connected with Sheryl Silva on 03/10/23 at  1:30 PM EDT by telephone and verified that I am speaking with the correct person using two identifiers.  Location: Patient: Home Provider: Clinic   I discussed the limitations, risks, security and privacy concerns of performing an evaluation and management service by telephone and the availability of in person appointments. I also discussed with the patient that there may be a patient responsible charge related to this service. The patient expressed understanding and agreed to proceed.   History of Present Illness: Sheryl Silva is a 47 year old woman with multiple medical problems including metastatic signet ring adenocarcinoma of likely GI origin.  Patient has known skeletal metastases and has had ongoing pain.  She also has chronic pain and is followed by pain clinic in [place]. She was referred to palliative care to address goals and manage ongoing symptoms.   Observations/Objective: I spoke with patient by phone.  She is currently traveling and visiting family in [place].  Patient says that she has had worse back pain, which she describes as a sharp, stabbing pain.  She is followed by pain management and is on Percocet 5-325 mg in addition to Ultram ER 100 mg daily.  Patient states that she has recently been taking Percocet every 4 hours and she says that that has improved her pain overall.  She understands that we will not be independently dose adjusting her opioids as she is currently under pain contract and she wants to continue having her pain physician - Dr. Derek Mound [facility]) to manage.  However, will reach out to her pain physician about her reported symptoms.  Alternatives to opioids, could consider NSAIDs given bony metastasis.  Will send referral to radiation oncology as well.  Assessment and Plan: Stage IV metastatic signet ring adenocarcinoma -on treatment with FOLFOX plus  Keytruda  Neoplasm related pain -on Ultram ER + Percocet.  Patient is followed by pain clinic.  Will reach out to pain physician regarding management.  Referral to radiation oncology.  Follow Up Instructions: Follow-up telephone visit 1 month   I discussed the assessment and treatment plan with the patient. The patient was provided an opportunity to ask questions and all were answered. The patient agreed with the plan and demonstrated an understanding of the instructions.   The patient was advised to call back or seek an in-person evaluation if the symptoms worsen or if the condition fails to improve as anticipated.  I provided 10 minutes of non-face-to-face time during this encounter.   Malachy Moan, NP

## 2023-03-16 ENCOUNTER — Ambulatory Visit: Payer: 59 | Attending: Radiation Oncology | Admitting: Radiation Oncology

## 2023-03-18 MED FILL — Dexamethasone Sodium Phosphate Inj 100 MG/10ML: INTRAMUSCULAR | Qty: 1 | Status: AC

## 2023-03-21 ENCOUNTER — Inpatient Hospital Stay: Payer: 59

## 2023-03-21 ENCOUNTER — Encounter: Payer: Self-pay | Admitting: Oncology

## 2023-03-21 ENCOUNTER — Encounter: Payer: Self-pay | Admitting: *Deleted

## 2023-03-21 ENCOUNTER — Inpatient Hospital Stay (HOSPITAL_BASED_OUTPATIENT_CLINIC_OR_DEPARTMENT_OTHER): Payer: 59 | Admitting: Oncology

## 2023-03-21 ENCOUNTER — Other Ambulatory Visit: Payer: Self-pay | Admitting: Lab

## 2023-03-21 ENCOUNTER — Telehealth: Payer: Self-pay | Admitting: Oncology

## 2023-03-21 ENCOUNTER — Ambulatory Visit: Payer: 59

## 2023-03-21 VITALS — BP 155/95 | HR 91 | Temp 96.3°F | Resp 16 | Ht 60.0 in | Wt 182.0 lb

## 2023-03-21 DIAGNOSIS — C801 Malignant (primary) neoplasm, unspecified: Secondary | ICD-10-CM

## 2023-03-21 DIAGNOSIS — R350 Frequency of micturition: Secondary | ICD-10-CM

## 2023-03-21 DIAGNOSIS — Z5112 Encounter for antineoplastic immunotherapy: Secondary | ICD-10-CM | POA: Diagnosis not present

## 2023-03-21 LAB — CBC WITH DIFFERENTIAL (CANCER CENTER ONLY)
Abs Immature Granulocytes: 0.03 10*3/uL (ref 0.00–0.07)
Basophils Absolute: 0 10*3/uL (ref 0.0–0.1)
Basophils Relative: 1 %
Eosinophils Absolute: 0.2 10*3/uL (ref 0.0–0.5)
Eosinophils Relative: 8 %
HCT: 33.9 % — ABNORMAL LOW (ref 36.0–46.0)
Hemoglobin: 11.3 g/dL — ABNORMAL LOW (ref 12.0–15.0)
Immature Granulocytes: 1 %
Lymphocytes Relative: 49 %
Lymphs Abs: 1.3 10*3/uL (ref 0.7–4.0)
MCH: 30 pg (ref 26.0–34.0)
MCHC: 33.3 g/dL (ref 30.0–36.0)
MCV: 89.9 fL (ref 80.0–100.0)
Monocytes Absolute: 0.3 10*3/uL (ref 0.1–1.0)
Monocytes Relative: 13 %
Neutro Abs: 0.7 10*3/uL — ABNORMAL LOW (ref 1.7–7.7)
Neutrophils Relative %: 28 %
Platelet Count: 254 10*3/uL (ref 150–400)
RBC: 3.77 MIL/uL — ABNORMAL LOW (ref 3.87–5.11)
RDW: 13.2 % (ref 11.5–15.5)
WBC Count: 2.7 10*3/uL — ABNORMAL LOW (ref 4.0–10.5)
nRBC: 0 % (ref 0.0–0.2)

## 2023-03-21 LAB — URINALYSIS, COMPLETE (UACMP) WITH MICROSCOPIC
Bacteria, UA: NONE SEEN
Bilirubin Urine: NEGATIVE
Glucose, UA: NEGATIVE mg/dL
Hgb urine dipstick: NEGATIVE
Ketones, ur: 5 mg/dL — AB
Leukocytes,Ua: NEGATIVE
Nitrite: NEGATIVE
Protein, ur: 30 mg/dL — AB
Specific Gravity, Urine: 1.034 — ABNORMAL HIGH (ref 1.005–1.030)
pH: 5 (ref 5.0–8.0)

## 2023-03-21 LAB — CMP (CANCER CENTER ONLY)
ALT: 23 U/L (ref 0–44)
AST: 34 U/L (ref 15–41)
Albumin: 3.5 g/dL (ref 3.5–5.0)
Alkaline Phosphatase: 591 U/L — ABNORMAL HIGH (ref 38–126)
Anion gap: 9 (ref 5–15)
BUN: 16 mg/dL (ref 6–20)
CO2: 24 mmol/L (ref 22–32)
Calcium: 8.7 mg/dL — ABNORMAL LOW (ref 8.9–10.3)
Chloride: 103 mmol/L (ref 98–111)
Creatinine: 0.69 mg/dL (ref 0.44–1.00)
GFR, Estimated: >60 mL/min (ref >60)
Glucose, Bld: 145 mg/dL — ABNORMAL HIGH (ref 70–99)
Potassium: 3.3 mmol/L — ABNORMAL LOW (ref 3.5–5.1)
Sodium: 136 mmol/L (ref 135–145)
Total Bilirubin: 0.6 mg/dL (ref 0.3–1.2)
Total Protein: 6.9 g/dL (ref 6.5–8.1)

## 2023-03-21 MED ORDER — MEGESTROL ACETATE 40 MG PO TABS: 40.0000 mg | ORAL_TABLET | Freq: Every day | ORAL | 2 refills | Status: DC

## 2023-03-21 NOTE — Progress Notes (Signed)
Having urinary frequency. Having pressure and discomfort. States she feels a pins and needles feeling in her port area. Did use her cream this morning. Wants to know if she can be given something to help with her appetite. Swelling under left arm.

## 2023-03-21 NOTE — Telephone Encounter (Signed)
Vm left with patient notifying her that due to the upcoming Holiday, she will skip her treatment next week and instead return on 6/3 at 8:15. These changes have been reflected in [facility] per Dr. Orlie Dakin. Requested a call back with any conflicts, questions or concerns.

## 2023-03-21 NOTE — Progress Notes (Signed)
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  INTERVAL HISTORY: Patient returns to clinic today for further evaluation and consideration of cycle 2 of FOLFOX plus Keytruda.  She has some increased fatigue and nausea with her first treatment, but otherwise tolerated it well.  She continues to have chronic pain and poor appetite. She has no neurologic complaints.  She denies any recent fevers or illnesses.  She has no chest pain, shortness of breath, cough, or hemoptysis.  She denies any nausea, vomiting, constipation, or diarrhea.  She has no urinary complaints.  Patient offers no further specific complaints today.  REVIEW OF SYSTEMS:   Review of Systems  Constitutional:  Positive for malaise/fatigue and weight loss. Negative for fever.  Respiratory: Negative.  Negative for cough, hemoptysis and shortness of breath.   Cardiovascular: Negative.  Negative for chest pain and leg swelling.  Gastrointestinal: Negative.  Negative for abdominal pain.  Genitourinary:  Positive for frequency. Negative for dysuria.  Musculoskeletal:  Positive for back pain.  Skin: Negative.  Negative for rash.  Neurological: Negative.  Negative for dizziness, focal weakness, weakness and headaches.  Psychiatric/Behavioral: Negative.  The patient is not nervous/anxious.     As per HPI. Otherwise, a complete review of systems is negative.  PAST MEDICAL HISTORY: Past Medical History:  Diagnosis Date   Anxiety    Cancer (HCC)    Depression    Heart murmur    Hypertension     PAST SURGICAL HISTORY: Past Surgical  History:  Procedure Laterality Date   CESAREAN SECTION     COLONOSCOPY WITH PROPOFOL N/A 02/21/2023   Procedure: COLONOSCOPY WITH PROPOFOL;  Surgeon: Regis Bill, MD;  Location: [facility] ENDOSCOPY;  Service: Endoscopy;  Laterality: N/A;   DILATION AND CURETTAGE OF UTERUS     IR IMAGING GUIDED PORT INSERTION  02/16/2023    FAMILY HISTORY: Family History  Problem Relation Age of Onset   Cancer Maternal Grandmother     ADVANCED DIRECTIVES (Y/N):  N  HEALTH MAINTENANCE: Social History   Tobacco Use   Smoking status: Former    Packs/day: .1    Types: Cigarettes    Quit date: 12/19/2022    Years since quitting: 0.2   Smokeless tobacco: Never   Tobacco comments:    Quit smoking 12/2022  Vaping Use   Vaping Use: Never used  Substance Use Topics   Alcohol use: Yes    Comment: occ   Drug use: No     Colonoscopy:  PAP:  Bone density:  Lipid panel:  Allergies  Allergen Reactions   Nifedipine Palpitations    Current Outpatient Medications  Medication Sig Dispense Refill   albuterol (PROVENTIL) (2.5 MG/3ML) 0.083% nebulizer solution Take 2.5 mg by nebulization every 6 (six) hours as needed for wheezing or shortness of breath.     cyanocobalamin (VITAMIN B12) 500 MCG tablet Take 500 mcg by mouth daily.     DULoxetine (CYMBALTA) 30 MG capsule Take 30 mg by mouth daily.     gabapentin (NEURONTIN) 300 MG capsule Take 300 mg by mouth 3 (three) times daily.     lidocaine-prilocaine (EMLA) cream Apply to affected area once 30  g 3   megestrol (MEGACE) 40 MG tablet Take 1 tablet (40 mg total) by mouth daily. 30 tablet 2   Multiple Vitamin (MULTIVITAMIN) tablet Take 1 tablet by mouth daily.     omeprazole (PRILOSEC) 20 MG capsule Take 20 mg by mouth daily.     ondansetron (ZOFRAN) 8 MG tablet Take 1 tablet (8 mg total) by mouth every 8 (eight) hours as needed for nausea or vomiting. Start on the third day after chemotherapy. 60 tablet 2   ondansetron (ZOFRAN-ODT) 4 MG  disintegrating tablet Take 4 mg by mouth 2 (two) times daily as needed for nausea or vomiting.     oxyCODONE-acetaminophen (PERCOCET/ROXICET) 5-325 MG tablet Take 1 tablet by mouth 2 (two) times daily.     polyethylene glycol (MIRALAX / GLYCOLAX) 17 g packet Take 17 g by mouth daily.     prochlorperazine (COMPAZINE) 10 MG tablet Take 1 tablet (10 mg total) by mouth every 6 (six) hours as needed for nausea or vomiting. 60 tablet 2   tiZANidine (ZANAFLEX) 2 MG tablet Take 2 mg by mouth at bedtime.     traMADol (ULTRAM-ER) 100 MG 24 hr tablet Take 100 mg by mouth daily.     Vitamin D, Ergocalciferol, (DRISDOL) 1.25 MG (50000 UNIT) CAPS capsule Take 50,000 Units by mouth every 7 (seven) days.     cetirizine (ZYRTEC ALLERGY) 10 MG tablet Take 1 tablet (10 mg total) by mouth daily. 30 tablet 0   cilostazol (PLETAL) 100 MG tablet Take 100 mg by mouth 2 (two) times daily. (Patient not taking: Reported on 02/14/2023)     cyclobenzaprine (FLEXERIL) 10 MG tablet Take 10 mg by mouth 3 (three) times daily as needed for muscle spasms. (Patient not taking: Reported on 02/14/2023)     naloxone [facility]) nasal spray 4 mg/0.1 mL Place 1 spray into the nose once. (Patient not taking: Reported on 02/14/2023)     NIFEdipine (ADALAT CC) 30 MG 24 hr tablet Take 30 mg by mouth daily. (Patient not taking: Reported on 02/14/2023)     potassium chloride (KLOR-CON) 10 MEQ tablet Take 10 mEq by mouth daily. (Patient not taking: Reported on 02/14/2023)     No current facility-administered medications for this visit.    OBJECTIVE: Vitals:   03/21/23 0900  BP: (!) 155/95  Pulse: 91  Resp: 16  Temp: (!) 96.3 F (35.7 C)  SpO2: 100%      Body mass index is 35.54 kg/m.    ECOG FS:1 - Symptomatic but completely ambulatory  General: Well-developed, well-nourished, no acute distress. Eyes: Pink conjunctiva, anicteric sclera. HEENT: Normocephalic, moist mucous membranes. Lungs: No audible wheezing or coughing. Heart: Regular  rate and rhythm. Abdomen: Soft, nontender, no obvious distention. Musculoskeletal: No edema, cyanosis, or clubbing. Neuro: Alert, answering all questions appropriately. Cranial nerves grossly intact. Skin: No rashes or petechiae noted. Psych: Normal affect.   LAB RESULTS:  Lab Results  Component Value Date   NA 136 03/21/2023   K 3.3 (L) 03/21/2023   CL 103 03/21/2023   CO2 24 03/21/2023   GLUCOSE 145 (H) 03/21/2023   BUN 16 03/21/2023   CREATININE 0.69 03/21/2023   CALCIUM 8.7 (L) 03/21/2023   PROT 6.9 03/21/2023   ALBUMIN 3.5 03/21/2023   AST 34 03/21/2023   ALT 23 03/21/2023   ALKPHOS 591 (H) 03/21/2023   BILITOT 0.6 03/21/2023   GFRNONAA >60 03/21/2023   GFRAA >60 09/27/2018    Lab Results  Component Value Date   WBC  2.7 (L) 03/21/2023   NEUTROABS 0.7 (L) 03/21/2023   HGB 11.3 (L) 03/21/2023   HCT 33.9 (L) 03/21/2023   MCV 89.9 03/21/2023   PLT 254 03/21/2023     STUDIES: NM PET Image Initial (PI) Skull Base To Thigh  Result Date: 02/24/2023 CLINICAL DATA:  Subsequent treatment strategy for adenocarcinoma of unknown origin. EXAM: NUCLEAR MEDICINE PET SKULL BASE TO THIGH TECHNIQUE: 9.41 mCi F-18 FDG was injected intravenously. Full-ring PET imaging was performed from the skull base to thigh after the radiotracer. CT data was obtained and used for attenuation correction and anatomic localization. Fasting blood glucose: 89 mg/dl COMPARISON:  Outside PET-CT from [facility] dated 12/22/2022 FINDINGS: Mediastinal blood pool activity: SUV max 2.39 Liver activity: SUV max NA NECK: Multi station hypermetabolic cervical lymphadenopathy. SUV max in right level 2 node has an SUV max of 8.07. Left-sided posterior cervical lymph node has an SUV max of 6.0 Incidental CT findings: None CHEST: Hypermetabolic axillary and mediastinal lymph nodes. 9 mm node in the left axilla on image 47/4 has an SUV max of 6.77. Right axillary node measuring 10 mm on image 46/4 has an SUV max of 5.79. 9 mm  prevascular node has an SUV max of 5.12. Precarinal node has an SUV max of 4.10. No worrisome pulmonary nodules. No hypermetabolic breast lesions are identified. Incidental CT findings: Right IJ Port-A-Cath in good position. ABDOMEN/PELVIS: 9 mm celiac axis node has an SUV max of 8.19. 12 mm para duodenal node has an SUV max of 4.67. Bilateral retroperitoneal hypermetabolic adenopathy. Inter aortocaval node has an SUV max of 6.98. Left para-aortic lymph nodes with SUV max of 6.17. No hepatic, splenic, pancreatic or adrenal gland lesions. No hypermetabolic bowel lesions are identified. No obstructive findings. Incidental CT findings: Cholelithiasis noted. Age advanced atherosclerotic calcifications involving the abdominal aorta and iliac arteries. Left adnexal adnexal lesion measuring 4.6 x 4.4 cm. Part of this is a simple cyst but other parts are solid and demonstrate low level hypermetabolism with SUV max of 3.62. Interval increase in size when compared to the prior CT scan from 12/18/2022. The right ovary is normal. The uterus is unremarkable. SKELETON: Diffuse hypermetabolic skeletal metastatic disease. This is largely sclerotic disease although there are some areas of mixed lytic and sclerotic changes. There is diffuse involvement of the axial and appendicular skeleton. Index lesions: Left proximal humerus SUV max 6.20, C2 SUV max 5.63, left manubrial sternum SUV max 7.08, T11 7.75, L5 8.87, left iliac bone 9.47, intertrochanteric region of the right hip 9.25. Incidental CT findings: No findings for spinal canal compromise. There is some cortical irregularity/erosive change involving the right hip lesion. IMPRESSION: 1. Diffuse hypermetabolic skeletal metastatic disease. 2. Hypermetabolic cervical, axillary, mediastinal, upper abdominal and retroperitoneal adenopathy. 3. Enlarging left adnexal lesion with low level hypermetabolism. No other definite primary neoplastic site is identified. 4. Age advanced  atherosclerotic calcifications involving the abdominal aorta and iliac arteries. 5. Aortic atherosclerosis. Aortic Atherosclerosis (ICD10-I70.0). Electronically Signed   By: Rudie Meyer M.D.   On: 02/24/2023 15:52   US PELVIC COMPLETE WITH TRANSVAGINAL  Result Date: 02/24/2023 CLINICAL DATA:  Positive pregnancy test. Thickened endometrial stripe. Quantitative beta HCG was 53.1 on 02/08/2023. EXAM: TRANSABDOMINAL AND TRANSVAGINAL ULTRASOUND OF PELVIS TECHNIQUE: Both transabdominal and transvaginal ultrasound examinations of the pelvis were performed. Transabdominal technique was performed for global imaging of the pelvis including uterus, ovaries, adnexal regions, and pelvic cul-de-sac. It was necessary to proceed with endovaginal exam following the transabdominal exam to visualize the  uterus, endometrium ovaries, and adnexal regions. COMPARISON:  02/04/2023 ultrasound and 02/03/2023 CT of the abdomen and pelvis from [facility] FINDINGS: Uterus Measurements: 7.6 x 3.9 x 4.0 centimeters = volume: 61 mL. Uterus is anteverted. There is no intrauterine gestational sac. Small posterior intramural fibroid is 1.4 x 0.9 x 1.3 centimeters. Nabothian cyst in the UPPER cervix is 1.9 centimeters. Endometrium Thickness: 6.7 millimeters.  No focal abnormality visualized. Right ovary Measurements: 2.2 x 1.4 x 1.7 centimeters = volume: 3.0 mL. Normal appearance/no adnexal mass. Left ovary Measurements: A normal LEFT ovary is not identified. Within the LEFT adnexal region there is a solid hypervascular mass measuring 4.8 x 4.6 x 4.3 centimeters. Other findings Small moderate free pelvic fluid. IMPRESSION: 1. No ultrasound evidence for intrauterine pregnancy. Early intrauterine or adnexal pregnancy cannot be entirely excluded. 2. Normal appearance of the RIGHT ovary. 3. Solid hypervascular mass in the LEFT adnexal region is 4.8 centimeters. Findings are suspicious for ovarian metastasis or primary ovarian neoplasm.  Electronically Signed   By: Norva Pavlov M.D.   On: 02/24/2023 14:15    ONCOLOGY: Patient's workup was initiated at [facility] in February 2024 at which point PET scan revealed widely metastatic disease with innumerable sclerotic bony lesions as well as axillary, mediastinal, and abdominal lymphadenopathy.  Subsequent biopsy of a right axillary lymph node revealed signet ring adenocarcinoma, likely GI origin.  Additional testing revealed high tumor mutational burden.  Further workup at [facility] in April 2024 revealed a negative EGD, and negative CT scanning of the brain.  CT scan of the abdomen and follow-up pelvic ultrasound revealed a large left adnexal mass that was not commented on PET scan from February.  CA 19-9 is normal, but CA125 and CEA are mildly elevated.    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  Repeat PET scan on February 24, 2023 reviewed independently confirming widespread metastatic disease.  Left adnexal mass is now PET positive of unclear clinical significance and after evaluation by gyn-onc, ultimately would not change the overall treatment plan.  Patient will receive FOLFOX plus Keytruda every 3 weeks for 12 cycles followed by maintenance Keytruda for at least 2 years.  Patient has had port placed.  Delay cycle 2 of treatment today secondary to neutropenia.  Will add for fulphilia with pump removal for the remainder of the cycles.  Return to clinic as scheduled for further evaluation and reconsideration of cycle 2. Will also add Zometa in with even number treatments.  Adnexal mass: PET scan as above.  Awaiting pathology from outside biopsy for further evaluation.  Appreciate gyn-onc input. Pain: Patient reports that she is seen in the pain clinic in [place], [place].  Can consider radiation oncology in the future if  necessary. Bony disease: Zometa as above. Transaminitis: Resolved. Anemia: Chronic and needs.  Patient's hemoglobin is 11.3. Neutropenia: Delay treatment as above and add fulphilia with pump removal.  Patient expressed understanding and was in agreement with this plan. She also understands that She can call clinic at any time with any questions, concerns, or complaints.    Cancer Staging  Signet ring cell adenocarcinoma [facility]) Staging form: Exocrine Pancreas, AJCC 8th Edition - Clinical stage from 02/07/2023: Stage IV (cTX, cNX, pM1) - Signed by Jeralyn Ruths, MD on 02/14/2023 Stage prefix: Initial diagnosis   Jeralyn Ruths, MD   03/21/2023 1:17 PM

## 2023-03-22 ENCOUNTER — Inpatient Hospital Stay: Payer: 59

## 2023-03-22 LAB — URINE CULTURE: Culture: NO GROWTH

## 2023-03-22 NOTE — Progress Notes (Signed)
Nutrition Follow-up:  Patient with metastatic signet ring adenocarcinoma, likely GI origin.  Patient received cycle 1 of folfox and keytruda, cycle 2 held due to neutropenia.  Fulphilia being added at pump removal.  Spoke with patient via phone for follow-up.  Reports that her appetite has been good since starting megace.  Able to eat egg and bacon sandwich this am.  Had nausea for about 3-4 days after first treatment but medication helped.  Has been drinking oral nutrition supplements. Yesterday able to eat steak and cheese sub and fruit.  Has also been eating chicken salad that daughter prepared for her.      Medications: megace added  Labs: reviewed  Anthropometrics:   Weight 182 lb on 5/20 178 lb on 4/30 UBW 190-200 lb   NUTRITION DIAGNOSIS: Unintentional weight loss stable    INTERVENTION:  Encouraged high calorie, high protein foods to maintain weight Continue antiemetics to help control symptoms so patient can eat.      MONITORING, EVALUATION, GOAL: weight trends, intake   NEXT VISIT: Wednesday, June 5 after pump removal  Amaree Leeper B. Freida Busman, RD, LDN Registered Dietitian [phone]

## 2023-03-23 ENCOUNTER — Ambulatory Visit: Payer: 59

## 2023-03-23 ENCOUNTER — Inpatient Hospital Stay: Payer: 59

## 2023-03-23 ENCOUNTER — Other Ambulatory Visit: Payer: 59

## 2023-03-23 ENCOUNTER — Ambulatory Visit: Payer: 59 | Admitting: Oncology

## 2023-03-23 ENCOUNTER — Other Ambulatory Visit: Payer: Self-pay

## 2023-03-25 ENCOUNTER — Telehealth: Payer: Self-pay | Admitting: *Deleted

## 2023-03-25 NOTE — Telephone Encounter (Signed)
Patient called and left message that she has a lump on her shoulder and that she is having sharp pain in her left lung when she breathes. I returned her call for more info and she states that she awoke this morning and has a tangerine sized lump on her shoulder next to her neck , has swelling under her left arm and has sharp pain in her left lung when she takes a deep breath. She then said that she is in [place]. I advised that she go to the ER for evaluation today. She agrees to get her daughter to take her to ER.  I asked that she lt [place] know what happens and she agreed

## 2023-03-26 ENCOUNTER — Other Ambulatory Visit: Payer: Self-pay

## 2023-03-29 ENCOUNTER — Encounter: Payer: Self-pay | Admitting: Oncology

## 2023-03-29 ENCOUNTER — Other Ambulatory Visit: Payer: 59

## 2023-03-29 ENCOUNTER — Ambulatory Visit: Payer: 59

## 2023-03-29 ENCOUNTER — Ambulatory Visit: Payer: 59 | Admitting: Oncology

## 2023-03-31 MED FILL — Dexamethasone Sodium Phosphate Inj 100 MG/10ML: INTRAMUSCULAR | Qty: 1 | Status: AC

## 2023-04-01 ENCOUNTER — Other Ambulatory Visit: Payer: Self-pay

## 2023-04-01 MED FILL — Fluorouracil IV Soln 5 GM/100ML (50 MG/ML): INTRAVENOUS | Qty: 90 | Status: AC

## 2023-04-01 MED FILL — Fluorouracil IV Soln 2.5 GM/50ML (50 MG/ML): INTRAVENOUS | Qty: 15 | Status: AC

## 2023-04-04 ENCOUNTER — Encounter: Payer: Self-pay | Admitting: Oncology

## 2023-04-04 ENCOUNTER — Inpatient Hospital Stay (HOSPITAL_BASED_OUTPATIENT_CLINIC_OR_DEPARTMENT_OTHER): Payer: 59 | Admitting: Oncology

## 2023-04-04 ENCOUNTER — Inpatient Hospital Stay: Payer: 59

## 2023-04-04 ENCOUNTER — Inpatient Hospital Stay: Payer: 59 | Attending: Oncology

## 2023-04-04 DIAGNOSIS — R7401 Elevation of levels of liver transaminase levels: Secondary | ICD-10-CM | POA: Diagnosis not present

## 2023-04-04 DIAGNOSIS — I10 Essential (primary) hypertension: Secondary | ICD-10-CM | POA: Diagnosis not present

## 2023-04-04 DIAGNOSIS — C801 Malignant (primary) neoplasm, unspecified: Secondary | ICD-10-CM

## 2023-04-04 DIAGNOSIS — M549 Dorsalgia, unspecified: Secondary | ICD-10-CM | POA: Diagnosis not present

## 2023-04-04 DIAGNOSIS — Z79620 Long term (current) use of immunosuppressive biologic: Secondary | ICD-10-CM | POA: Diagnosis not present

## 2023-04-04 DIAGNOSIS — Z5112 Encounter for antineoplastic immunotherapy: Secondary | ICD-10-CM | POA: Diagnosis present

## 2023-04-04 DIAGNOSIS — Z3201 Encounter for pregnancy test, result positive: Secondary | ICD-10-CM | POA: Diagnosis not present

## 2023-04-04 DIAGNOSIS — Z5189 Encounter for other specified aftercare: Secondary | ICD-10-CM | POA: Diagnosis not present

## 2023-04-04 DIAGNOSIS — Z111 Encounter for screening for respiratory tuberculosis: Secondary | ICD-10-CM | POA: Diagnosis present

## 2023-04-04 DIAGNOSIS — D649 Anemia, unspecified: Secondary | ICD-10-CM | POA: Diagnosis not present

## 2023-04-04 DIAGNOSIS — R634 Abnormal weight loss: Secondary | ICD-10-CM | POA: Insufficient documentation

## 2023-04-04 DIAGNOSIS — Z452 Encounter for adjustment and management of vascular access device: Secondary | ICD-10-CM | POA: Diagnosis not present

## 2023-04-04 LAB — CBC WITH DIFFERENTIAL (CANCER CENTER ONLY)
Abs Immature Granulocytes: 0.04 10*3/uL (ref 0.00–0.07)
Basophils Absolute: 0 10*3/uL (ref 0.0–0.1)
Basophils Relative: 1 %
Eosinophils Absolute: 0.4 10*3/uL (ref 0.0–0.5)
Eosinophils Relative: 6 %
HCT: 33.8 % — ABNORMAL LOW (ref 36.0–46.0)
Hemoglobin: 11.3 g/dL — ABNORMAL LOW (ref 12.0–15.0)
Immature Granulocytes: 1 %
Lymphocytes Relative: 29 %
Lymphs Abs: 2 10*3/uL (ref 0.7–4.0)
MCH: 30.1 pg (ref 26.0–34.0)
MCHC: 33.4 g/dL (ref 30.0–36.0)
MCV: 89.9 fL (ref 80.0–100.0)
Monocytes Absolute: 0.7 10*3/uL (ref 0.1–1.0)
Monocytes Relative: 9 %
Neutro Abs: 3.9 10*3/uL (ref 1.7–7.7)
Neutrophils Relative %: 54 %
Platelet Count: 269 10*3/uL (ref 150–400)
RBC: 3.76 MIL/uL — ABNORMAL LOW (ref 3.87–5.11)
RDW: 13.7 % (ref 11.5–15.5)
WBC Count: 7.1 10*3/uL (ref 4.0–10.5)
nRBC: 0 % (ref 0.0–0.2)

## 2023-04-04 LAB — PREGNANCY, URINE: Preg Test, Ur: NEGATIVE

## 2023-04-04 LAB — CMP (CANCER CENTER ONLY)
ALT: 29 U/L (ref 0–44)
AST: 29 U/L (ref 15–41)
Albumin: 3.6 g/dL (ref 3.5–5.0)
Alkaline Phosphatase: 572 U/L — ABNORMAL HIGH (ref 38–126)
Anion gap: 8 (ref 5–15)
BUN: 17 mg/dL (ref 6–20)
CO2: 20 mmol/L — ABNORMAL LOW (ref 22–32)
Calcium: 8.9 mg/dL (ref 8.9–10.3)
Chloride: 107 mmol/L (ref 98–111)
Creatinine: 0.6 mg/dL (ref 0.44–1.00)
GFR, Estimated: >60 mL/min (ref >60)
Glucose, Bld: 118 mg/dL — ABNORMAL HIGH (ref 70–99)
Potassium: 3.6 mmol/L (ref 3.5–5.1)
Sodium: 135 mmol/L (ref 135–145)
Total Bilirubin: 0.5 mg/dL (ref 0.3–1.2)
Total Protein: 7.5 g/dL (ref 6.5–8.1)

## 2023-04-04 MED ORDER — OXALIPLATIN CHEMO INJECTION 100 MG/20ML
85.0000 mg/m2 | Freq: Once | INTRAVENOUS | Status: AC
  Administered 2023-04-04: 150 mg via INTRAVENOUS
  Filled 2023-04-04: qty 25.67

## 2023-04-04 MED ORDER — DEXTROSE 5 % IV SOLN
Freq: Once | INTRAVENOUS | Status: AC
  Filled 2023-04-04: qty 250

## 2023-04-04 MED ORDER — LEUCOVORIN CALCIUM INJECTION 350 MG
400.0000 mg/m2 | Freq: Once | INTRAVENOUS | Status: AC
  Administered 2023-04-04: 748 mg via INTRAVENOUS
  Filled 2023-04-04: qty 37.4

## 2023-04-04 MED ORDER — ZOLEDRONIC ACID 4 MG/100ML IV SOLN
4.0000 mg | Freq: Once | INTRAVENOUS | Status: AC
  Administered 2023-04-04: 4 mg via INTRAVENOUS
  Filled 2023-04-04: qty 100

## 2023-04-04 MED ORDER — SODIUM CHLORIDE 0.9 % IV SOLN
200.0000 mg | Freq: Once | INTRAVENOUS | Status: AC
  Administered 2023-04-04: 200 mg via INTRAVENOUS
  Filled 2023-04-04: qty 200

## 2023-04-04 MED ORDER — SODIUM CHLORIDE 0.9 % IV SOLN
Freq: Once | INTRAVENOUS | Status: DC
  Filled 2023-04-04: qty 250

## 2023-04-04 MED ORDER — FLUOROURACIL CHEMO INJECTION 2.5 GM/50ML
400.0000 mg/m2 | Freq: Once | INTRAVENOUS | Status: AC
  Administered 2023-04-04: 750 mg via INTRAVENOUS
  Filled 2023-04-04: qty 15

## 2023-04-04 MED ORDER — SODIUM CHLORIDE 0.9 % IV SOLN
10.0000 mg | Freq: Once | INTRAVENOUS | Status: AC
  Administered 2023-04-04: 10 mg via INTRAVENOUS
  Filled 2023-04-04: qty 10
  Filled 2023-04-04: qty 1

## 2023-04-04 MED ORDER — SODIUM CHLORIDE 0.9 % IV SOLN
2400.0000 mg/m2 | INTRAVENOUS | Status: DC
  Administered 2023-04-04: 4500 mg via INTRAVENOUS
  Filled 2023-04-04: qty 90

## 2023-04-04 MED ORDER — PALONOSETRON HCL INJECTION 0.25 MG/5ML
0.2500 mg | Freq: Once | INTRAVENOUS | Status: AC
  Administered 2023-04-04: 0.25 mg via INTRAVENOUS
  Filled 2023-04-04: qty 5

## 2023-04-04 NOTE — Progress Notes (Addendum)
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  INTERVAL HISTORY: Patient returns to clinic today for further evaluation and reconsideration of cycle 2 of FOLFOX plus Keytruda.  She noticed increased swelling in her left supraclavicular fossa, but otherwise feels well.  She does not complain of pain today.  Her appetite has improved, but has continued weight loss.  She has no neurologic complaints.  She denies any recent fevers or illnesses.  She has no chest pain, shortness of breath, cough, or hemoptysis.  She denies any nausea, vomiting, constipation, or diarrhea.  She has no urinary complaints.  Patient offers no further specific complaints today.  REVIEW OF SYSTEMS:   Review of Systems  Constitutional:  Positive for malaise/fatigue and weight loss. Negative for fever.  Respiratory: Negative.  Negative for cough, hemoptysis and shortness of breath.   Cardiovascular: Negative.  Negative for chest pain and leg swelling.  Gastrointestinal: Negative.  Negative for abdominal pain.  Genitourinary: Negative.  Negative for dysuria and frequency.  Musculoskeletal: Negative.  Negative for back pain.  Skin: Negative.  Negative for rash.  Neurological: Negative.  Negative for dizziness, focal weakness, weakness and headaches.  Psychiatric/Behavioral: Negative.  The patient is not nervous/anxious.     As per HPI. Otherwise, a complete review of systems is negative.  PAST MEDICAL HISTORY: Past Medical History:  Diagnosis Date   Anxiety    Cancer (HCC)    Depression    Heart murmur    Hypertension      PAST SURGICAL HISTORY: Past Surgical History:  Procedure Laterality Date   CESAREAN SECTION     COLONOSCOPY WITH PROPOFOL N/A 02/21/2023   Procedure: COLONOSCOPY WITH PROPOFOL;  Surgeon: Regis Bill, MD;  Location: [facility] ENDOSCOPY;  Service: Endoscopy;  Laterality: N/A;   DILATION AND CURETTAGE OF UTERUS     IR IMAGING GUIDED PORT INSERTION  02/16/2023    FAMILY HISTORY: Family History  Problem Relation Age of Onset   Cancer Maternal Grandmother     ADVANCED DIRECTIVES (Y/N):  N  HEALTH MAINTENANCE: Social History   Tobacco Use   Smoking status: Former    Packs/day: .1    Types: Cigarettes    Quit date: 12/19/2022    Years since quitting: 0.2   Smokeless tobacco: Never   Tobacco comments:    Quit smoking 12/2022  Vaping Use   Vaping Use: Never used  Substance Use Topics   Alcohol use: Yes    Comment: occ   Drug use: No     Colonoscopy:  PAP:  Bone density:  Lipid panel:  Allergies  Allergen Reactions   Nifedipine Palpitations    Current Outpatient Medications  Medication Sig Dispense Refill   albuterol (PROVENTIL) (2.5 MG/3ML) 0.083% nebulizer solution Take 2.5 mg by nebulization every 6 (six) hours as needed for wheezing or shortness of breath.     Buprenorphine HCl-Naloxone HCl 4-1 MG FILM Place 4 mg of opioid under the tongue every 12 (twelve) hours.     cyanocobalamin (VITAMIN B12) 500 MCG tablet Take 500 mcg by mouth daily.     DULoxetine (CYMBALTA) 30 MG capsule Take 30 mg by mouth daily.  gabapentin (NEURONTIN) 300 MG capsule Take 300 mg by mouth 3 (three) times daily.     lidocaine-prilocaine (EMLA) cream Apply to affected area once 30 g 3   megestrol (MEGACE) 40 MG tablet Take 1 tablet (40 mg total) by mouth daily. 30 tablet 2   Multiple Vitamin (MULTIVITAMIN) tablet Take 1 tablet by mouth daily.     omeprazole (PRILOSEC) 20 MG capsule Take 20 mg by mouth daily.     ondansetron (ZOFRAN) 8 MG tablet Take 1 tablet (8 mg total) by mouth  every 8 (eight) hours as needed for nausea or vomiting. Start on the third day after chemotherapy. 60 tablet 2   ondansetron (ZOFRAN-ODT) 4 MG disintegrating tablet Take 4 mg by mouth 2 (two) times daily as needed for nausea or vomiting.     polyethylene glycol (MIRALAX / GLYCOLAX) 17 g packet Take 17 g by mouth daily.     prochlorperazine (COMPAZINE) 10 MG tablet Take 1 tablet (10 mg total) by mouth every 6 (six) hours as needed for nausea or vomiting. 60 tablet 2   tiZANidine (ZANAFLEX) 2 MG tablet Take 2 mg by mouth at bedtime.     traMADol (ULTRAM-ER) 100 MG 24 hr tablet Take 100 mg by mouth daily.     Vitamin D, Ergocalciferol, (DRISDOL) 1.25 MG (50000 UNIT) CAPS capsule Take 50,000 Units by mouth every 7 (seven) days.     cetirizine (ZYRTEC ALLERGY) 10 MG tablet Take 1 tablet (10 mg total) by mouth daily. 30 tablet 0   cilostazol (PLETAL) 100 MG tablet Take 100 mg by mouth 2 (two) times daily. (Patient not taking: Reported on 02/14/2023)     cyclobenzaprine (FLEXERIL) 10 MG tablet Take 10 mg by mouth 3 (three) times daily as needed for muscle spasms. (Patient not taking: Reported on 02/14/2023)     naloxone [facility]) nasal spray 4 mg/0.1 mL Place 1 spray into the nose once. (Patient not taking: Reported on 02/14/2023)     NIFEdipine (ADALAT CC) 30 MG 24 hr tablet Take 30 mg by mouth daily. (Patient not taking: Reported on 02/14/2023)     oxyCODONE-acetaminophen (PERCOCET/ROXICET) 5-325 MG tablet Take 1 tablet by mouth 2 (two) times daily. (Patient not taking: Reported on 04/04/2023)     potassium chloride (KLOR-CON) 10 MEQ tablet Take 10 mEq by mouth daily. (Patient not taking: Reported on 02/14/2023)     No current facility-administered medications for this visit.   Facility-Administered Medications Ordered in Other Visits  Medication Dose Route Frequency Provider Last Rate Last Admin   fluorouracil (ADRUCIL) 4,500 mg in sodium chloride 0.9 % 60 mL chemo infusion  2,400 mg/m2 (Treatment Plan  Recorded) Intravenous 1 day or 1 dose Orlie Dakin, Tollie Pizza, MD       fluorouracil (ADRUCIL) chemo injection 750 mg  400 mg/m2 (Treatment Plan Recorded) Intravenous Once Jeralyn Ruths, MD       leucovorin 748 mg in dextrose 5 % 250 mL infusion  400 mg/m2 (Treatment Plan Recorded) Intravenous Once Jeralyn Ruths, MD       oxaliplatin (ELOXATIN) 150 mg in dextrose 5 % 500 mL chemo infusion  85 mg/m2 (Treatment Plan Recorded) Intravenous Once Jeralyn Ruths, MD       palonosetron (ALOXI) injection 0.25 mg  0.25 mg Intravenous Once Jeralyn Ruths, MD       pembrolizumab [facility]) 200 mg in sodium chloride 0.9 % 50 mL chemo infusion  200 mg Intravenous Once Jeralyn Ruths, MD  OBJECTIVE: Vitals:   04/04/23 0841  BP: (!) 166/81  Pulse: 87  Resp: 16  Temp: (!) 96.5 F (35.8 C)  SpO2: 100%      Body mass index is 34.94 kg/m.    ECOG FS:1 - Symptomatic but completely ambulatory  General: Well-developed, well-nourished, no acute distress. Eyes: Pink conjunctiva, anicteric sclera. HEENT: Normocephalic, moist mucous membranes. Lungs: No audible wheezing or coughing. Heart: Regular rate and rhythm. Abdomen: Soft, nontender, no obvious distention. Musculoskeletal: No edema, cyanosis, or clubbing. Neuro: Alert, answering all questions appropriately. Cranial nerves grossly intact. Skin: No rashes or petechiae noted. Psych: Normal affect.  LAB RESULTS:  Lab Results  Component Value Date   NA 135 04/04/2023   K 3.6 04/04/2023   CL 107 04/04/2023   CO2 20 (L) 04/04/2023   GLUCOSE 118 (H) 04/04/2023   BUN 17 04/04/2023   CREATININE 0.60 04/04/2023   CALCIUM 8.9 04/04/2023   PROT 7.5 04/04/2023   ALBUMIN 3.6 04/04/2023   AST 29 04/04/2023   ALT 29 04/04/2023   ALKPHOS 572 (H) 04/04/2023   BILITOT 0.5 04/04/2023   GFRNONAA >60 04/04/2023   GFRAA >60 09/27/2018    Lab Results  Component Value Date   WBC 7.1 04/04/2023   NEUTROABS 3.9 04/04/2023   HGB  11.3 (L) 04/04/2023   HCT 33.8 (L) 04/04/2023   MCV 89.9 04/04/2023   PLT 269 04/04/2023     STUDIES: No results found.  ONCOLOGY: Patient's workup was initiated at [facility] in February 2024 at which point PET scan revealed widely metastatic disease with innumerable sclerotic bony lesions as well as axillary, mediastinal, and abdominal lymphadenopathy.  Subsequent biopsy of a right axillary lymph node revealed signet ring adenocarcinoma, likely GI origin.  Additional testing revealed high tumor mutational burden.  Further workup at [facility] in April 2024 revealed a negative EGD, and negative CT scanning of the brain.  CT scan of the abdomen and follow-up pelvic ultrasound revealed a large left adnexal mass that was not commented on PET scan from February.  CA 19-9 is normal, but CA125 and CEA are mildly elevated.    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  Repeat PET scan on February 24, 2023 reviewed independently confirming widespread metastatic disease.  Left adnexal mass is now PET positive of unclear clinical significance and after evaluation by gyn-onc, ultimately would not change the overall treatment plan.  Patient will receive FOLFOX plus Keytruda every 3 weeks for 12 cycles followed by maintenance Keytruda for at least 2 years.  Patient has had port placed.  Proceed with cycle 2 today.  Return to clinic in 2 days for pump removal and  fulphilia.  Patient will then return to clinic in 3 weeks for further evaluation and consideration of cycle 3. Will also add Zometa in with even number treatments.  Adnexal mass: PET scan as above.  Awaiting pathology from outside biopsy for further evaluation.  Appreciate gyn-onc input. Pain: Patient does not complain of this today.  Patient reports that she is seen in the pain clinic in [place],  [place].  Can consider radiation oncology in the future if necessary. Bony disease: Zometa on even number cycles as above. Transaminitis: Resolved. Anemia: Chronic and unchanged.  Patient's hemoglobin is 11.3 today. Neutropenia: Resolved.  Fulphilia with pump removal as above. Weight loss: Follow-up with dietary as scheduled. Hypertension:  Monitor.  Patient expressed understanding and was in agreement with this plan. She also understands that She can call clinic at any time with any questions, concerns, or complaints.    Cancer Staging  Signet ring cell adenocarcinoma [facility]) Staging form: Exocrine Pancreas, AJCC 8th Edition - Clinical stage from 02/07/2023: Stage IV (cTX, cNX, pM1) - Signed by Jeralyn Ruths, MD on 02/14/2023 Stage prefix: Initial diagnosis   Jeralyn Ruths, MD   04/04/2023 9:47 AM

## 2023-04-04 NOTE — Patient Instructions (Addendum)
Please pick up Calcium 600mg /Vitamin D tablets over the counter. You will also need to get Claritin and Tylenol to prevent bone pain. Please let us know if you have any questions.   [facility]  Discharge Instructions: Thank you for choosing [facility] to provide your oncology and hematology care.  If you have a lab appointment with the Cancer Center, please go directly to the Cancer Center and check in at the registration area.  Wear comfortable clothing and clothing appropriate for easy access to any Portacath or PICC line.   We strive to give you quality time with your provider. You may need to reschedule your appointment if you arrive late (15 or more minutes).  Arriving late affects you and other patients whose appointments are after yours.  Also, if you miss three or more appointments without notifying the office, you may be dismissed from the clinic at the provider's discretion.      For prescription refill requests, have your pharmacy contact our office and allow 72 hours for refills to be completed.    Today you received the following chemotherapy and/or immunotherapy agents FOLFOX, Keytruda      To help prevent nausea and vomiting after your treatment, we encourage you to take your nausea medication as directed.  BELOW ARE SYMPTOMS THAT SHOULD BE REPORTED IMMEDIATELY: *FEVER GREATER THAN 100.4 F (38 C) OR HIGHER *CHILLS OR SWEATING *NAUSEA AND VOMITING THAT IS NOT CONTROLLED WITH YOUR NAUSEA MEDICATION *UNUSUAL SHORTNESS OF BREATH *UNUSUAL BRUISING OR BLEEDING *URINARY PROBLEMS (pain or burning when urinating, or frequent urination) *BOWEL PROBLEMS (unusual diarrhea, constipation, pain near the anus) TENDERNESS IN MOUTH AND THROAT WITH OR WITHOUT PRESENCE OF ULCERS (sore throat, sores in mouth, or a toothache) UNUSUAL RASH, SWELLING OR PAIN  UNUSUAL VAGINAL DISCHARGE OR ITCHING   Items with * indicate a potential emergency and  should be followed up as soon as possible or go to the Emergency Department if any problems should occur.  Please show the CHEMOTHERAPY ALERT CARD or IMMUNOTHERAPY ALERT CARD at check-in to the Emergency Department and triage nurse.  Should you have questions after your visit or need to cancel or reschedule your appointment, please contact [facility]  [phone] and follow the prompts.  Office hours are 8:00 a.m. to 4:30 p.m. Monday - Friday. Please note that voicemails left after 4:00 p.m. may not be returned until the following business day.  We are closed weekends and major holidays. You have access to a nurse at all times for urgent questions. Please call the main number to the clinic [phone] and follow the prompts.  For any non-urgent questions, you may also contact your provider using MyChart. We now offer e-Visits for anyone 79 and older to request care online for non-urgent symptoms. For details visit [email or web address].   Also download the MyChart app! Go to the app store, search "MyChart", open the app, select [place], and log in with your MyChart username and password.

## 2023-04-04 NOTE — Progress Notes (Signed)
Has knot in left neck/shoulder area. No pain associated.

## 2023-04-06 ENCOUNTER — Inpatient Hospital Stay: Payer: 59

## 2023-04-06 VITALS — BP 130/84 | HR 90 | Resp 18

## 2023-04-06 DIAGNOSIS — C801 Malignant (primary) neoplasm, unspecified: Secondary | ICD-10-CM

## 2023-04-06 DIAGNOSIS — Z5112 Encounter for antineoplastic immunotherapy: Secondary | ICD-10-CM | POA: Diagnosis not present

## 2023-04-06 MED ORDER — PEGFILGRASTIM-JMDB 6 MG/0.6ML ~~LOC~~ SOSY
6.0000 mg | PREFILLED_SYRINGE | Freq: Once | SUBCUTANEOUS | Status: AC
  Administered 2023-04-06: 6 mg via SUBCUTANEOUS
  Filled 2023-04-06: qty 0.6

## 2023-04-06 MED ORDER — HEPARIN SOD (PORK) LOCK FLUSH 100 UNIT/ML IV SOLN
500.0000 [IU] | Freq: Once | INTRAVENOUS | Status: AC | PRN
  Administered 2023-04-06: 500 [IU]
  Filled 2023-04-06: qty 5

## 2023-04-06 MED ORDER — SODIUM CHLORIDE 0.9% FLUSH
10.0000 mL | INTRAVENOUS | Status: DC | PRN
  Administered 2023-04-06: 10 mL
  Filled 2023-04-06: qty 10

## 2023-04-06 NOTE — Progress Notes (Signed)
Nutrition Follow-up:  Patient with metastatic signet ring adenocarcinoma, likely GI origin.  Patient receiving folfox and Clover Mealy with patient and daughter following pump removal.  Patient reports that overall appetite is pretty good.  Appetite decreases usually today and for few more days will be down then will pick back up.  Has had some nausea and taking medication.  Ate a grilled cheese today and drank and ensure.  Has been trying to eat good sources of protein.    Medications: reviewed  Labs: reviewed  Anthropometrics:   Weight 178 lb 14.4 oz on 6/3 182 lb on 5/20 178 lb on 4/30  UBW of 190-200 lb   NUTRITION DIAGNOSIS: Unintentional weight loss stable    INTERVENTION:  Encouraged high calorie, high protein foods to maintain weight Complimentary case of ensure HP provided to patient due to decreased intake, history of weight loss, financial concerns.       MONITORING, EVALUATION, GOAL: weight trends, intake   NEXT VISIT: Wednesday, July 10 phone call  Antawan Mchugh B. Freida Busman, RD, LDN Registered Dietitian [phone]

## 2023-04-07 ENCOUNTER — Inpatient Hospital Stay: Payer: 59 | Admitting: Hospice and Palliative Medicine

## 2023-04-07 ENCOUNTER — Other Ambulatory Visit: Payer: Self-pay

## 2023-04-08 ENCOUNTER — Inpatient Hospital Stay (HOSPITAL_BASED_OUTPATIENT_CLINIC_OR_DEPARTMENT_OTHER): Payer: 59 | Admitting: Hospice and Palliative Medicine

## 2023-04-08 DIAGNOSIS — C801 Malignant (primary) neoplasm, unspecified: Secondary | ICD-10-CM

## 2023-04-08 NOTE — Progress Notes (Signed)
Unable to reach patient for scheduled telephone visit.  Unable to leave voicemail as mailbox is full.  Will reschedule.

## 2023-04-09 ENCOUNTER — Other Ambulatory Visit: Payer: Self-pay

## 2023-04-11 ENCOUNTER — Ambulatory Visit: Payer: 59

## 2023-04-11 ENCOUNTER — Other Ambulatory Visit: Payer: 59

## 2023-04-11 ENCOUNTER — Ambulatory Visit: Payer: 59 | Admitting: Oncology

## 2023-04-12 ENCOUNTER — Telehealth: Payer: Self-pay | Admitting: *Deleted

## 2023-04-12 NOTE — Telephone Encounter (Signed)
Patient called yesterday and left message that she thinks she has a UTI. I had returned her call and got her voice mail and left message that I needed more info as to her symptoms. She called back this afternoon stating that she is having frequency of small amounts of urine. Please advise

## 2023-04-22 MED FILL — Dexamethasone Sodium Phosphate Inj 100 MG/10ML: INTRAMUSCULAR | Qty: 1 | Status: AC

## 2023-04-22 MED FILL — Fluorouracil IV Soln 5 GM/100ML (50 MG/ML): INTRAVENOUS | Qty: 90 | Status: AC

## 2023-04-22 MED FILL — Fluorouracil IV Soln 2.5 GM/50ML (50 MG/ML): INTRAVENOUS | Qty: 15 | Status: AC

## 2023-04-25 ENCOUNTER — Other Ambulatory Visit: Payer: Self-pay

## 2023-04-25 ENCOUNTER — Inpatient Hospital Stay: Payer: 59

## 2023-04-25 ENCOUNTER — Inpatient Hospital Stay (HOSPITAL_BASED_OUTPATIENT_CLINIC_OR_DEPARTMENT_OTHER): Payer: 59 | Admitting: Nurse Practitioner

## 2023-04-25 ENCOUNTER — Encounter: Payer: Self-pay | Admitting: Nurse Practitioner

## 2023-04-25 VITALS — BP 140/86 | Temp 96.3°F | Wt 181.0 lb

## 2023-04-25 DIAGNOSIS — Z5111 Encounter for antineoplastic chemotherapy: Secondary | ICD-10-CM

## 2023-04-25 DIAGNOSIS — C801 Malignant (primary) neoplasm, unspecified: Secondary | ICD-10-CM

## 2023-04-25 DIAGNOSIS — F410 Panic disorder [episodic paroxysmal anxiety] without agoraphobia: Secondary | ICD-10-CM | POA: Insufficient documentation

## 2023-04-25 DIAGNOSIS — Z5112 Encounter for antineoplastic immunotherapy: Secondary | ICD-10-CM

## 2023-04-25 DIAGNOSIS — G893 Neoplasm related pain (acute) (chronic): Secondary | ICD-10-CM

## 2023-04-25 DIAGNOSIS — R7989 Other specified abnormal findings of blood chemistry: Secondary | ICD-10-CM

## 2023-04-25 DIAGNOSIS — C7951 Secondary malignant neoplasm of bone: Secondary | ICD-10-CM

## 2023-04-25 LAB — CBC WITH DIFFERENTIAL (CANCER CENTER ONLY)
Abs Immature Granulocytes: 0.16 10*3/uL — ABNORMAL HIGH (ref 0.00–0.07)
Basophils Absolute: 0 10*3/uL (ref 0.0–0.1)
Basophils Relative: 1 %
Eosinophils Absolute: 0.2 10*3/uL (ref 0.0–0.5)
Eosinophils Relative: 2 %
HCT: 30.6 % — ABNORMAL LOW (ref 36.0–46.0)
Hemoglobin: 10.2 g/dL — ABNORMAL LOW (ref 12.0–15.0)
Immature Granulocytes: 2 %
Lymphocytes Relative: 25 %
Lymphs Abs: 1.8 10*3/uL (ref 0.7–4.0)
MCH: 30.4 pg (ref 26.0–34.0)
MCHC: 33.3 g/dL (ref 30.0–36.0)
MCV: 91.3 fL (ref 80.0–100.0)
Monocytes Absolute: 0.6 10*3/uL (ref 0.1–1.0)
Monocytes Relative: 8 %
Neutro Abs: 4.7 10*3/uL (ref 1.7–7.7)
Neutrophils Relative %: 62 %
Platelet Count: 184 10*3/uL (ref 150–400)
RBC: 3.35 MIL/uL — ABNORMAL LOW (ref 3.87–5.11)
RDW: 15.1 % (ref 11.5–15.5)
WBC Count: 7.5 10*3/uL (ref 4.0–10.5)
nRBC: 1.2 % — ABNORMAL HIGH (ref 0.0–0.2)

## 2023-04-25 LAB — HCG, QUANTITATIVE, PREGNANCY: hCG, Beta Chain, Quant, S: 66 m[IU]/mL — ABNORMAL HIGH (ref <5)

## 2023-04-25 LAB — CMP (CANCER CENTER ONLY)
ALT: 19 U/L (ref 0–44)
AST: 26 U/L (ref 15–41)
Albumin: 3.4 g/dL — ABNORMAL LOW (ref 3.5–5.0)
Alkaline Phosphatase: 399 U/L — ABNORMAL HIGH (ref 38–126)
Anion gap: 6 (ref 5–15)
BUN: 15 mg/dL (ref 6–20)
CO2: 21 mmol/L — ABNORMAL LOW (ref 22–32)
Calcium: 8.1 mg/dL — ABNORMAL LOW (ref 8.9–10.3)
Chloride: 108 mmol/L (ref 98–111)
Creatinine: 0.66 mg/dL (ref 0.44–1.00)
GFR, Estimated: >60 mL/min (ref >60)
Glucose, Bld: 137 mg/dL — ABNORMAL HIGH (ref 70–99)
Potassium: 3.5 mmol/L (ref 3.5–5.1)
Sodium: 135 mmol/L (ref 135–145)
Total Bilirubin: 0.8 mg/dL (ref 0.3–1.2)
Total Protein: 6.8 g/dL (ref 6.5–8.1)

## 2023-04-25 LAB — IRON AND TIBC
Iron: 93 ug/dL (ref 28–170)
Saturation Ratios: 34 % — ABNORMAL HIGH (ref 10.4–31.8)
TIBC: 273 ug/dL (ref 250–450)
UIBC: 180 ug/dL

## 2023-04-25 LAB — VITAMIN B12: Vitamin B-12: 660 pg/mL (ref 180–914)

## 2023-04-25 LAB — FERRITIN: Ferritin: 500 ng/mL — ABNORMAL HIGH (ref 11–307)

## 2023-04-25 LAB — PREGNANCY, URINE: Preg Test, Ur: POSITIVE — AB

## 2023-04-25 LAB — TSH: TSH: 0.714 u[IU]/mL (ref 0.350–4.500)

## 2023-04-25 MED ORDER — SODIUM CHLORIDE 0.9 % IV SOLN
2400.0000 mg/m2 | INTRAVENOUS | Status: DC
  Administered 2023-04-25: 4500 mg via INTRAVENOUS
  Filled 2023-04-25: qty 90

## 2023-04-25 MED ORDER — FLUOROURACIL CHEMO INJECTION 2.5 GM/50ML
400.0000 mg/m2 | Freq: Once | INTRAVENOUS | Status: AC
  Administered 2023-04-25: 750 mg via INTRAVENOUS
  Filled 2023-04-25: qty 15

## 2023-04-25 MED ORDER — LEUCOVORIN CALCIUM INJECTION 350 MG
400.0000 mg/m2 | Freq: Once | INTRAVENOUS | Status: AC
  Administered 2023-04-25: 748 mg via INTRAVENOUS
  Filled 2023-04-25: qty 37.4

## 2023-04-25 MED ORDER — SODIUM CHLORIDE 0.9 % IV SOLN
10.0000 mg | Freq: Once | INTRAVENOUS | Status: AC
  Administered 2023-04-25: 10 mg via INTRAVENOUS
  Filled 2023-04-25: qty 10

## 2023-04-25 MED ORDER — SODIUM CHLORIDE 0.9 % IV SOLN
200.0000 mg | Freq: Once | INTRAVENOUS | Status: AC
  Administered 2023-04-25: 200 mg via INTRAVENOUS
  Filled 2023-04-25: qty 200

## 2023-04-25 MED ORDER — DEXTROSE 5 % IV SOLN
Freq: Once | INTRAVENOUS | Status: AC
  Filled 2023-04-25: qty 250

## 2023-04-25 MED ORDER — OXALIPLATIN CHEMO INJECTION 100 MG/20ML
85.0000 mg/m2 | Freq: Once | INTRAVENOUS | Status: AC
  Administered 2023-04-25: 150 mg via INTRAVENOUS
  Filled 2023-04-25: qty 10

## 2023-04-25 MED ORDER — PALONOSETRON HCL INJECTION 0.25 MG/5ML
0.2500 mg | Freq: Once | INTRAVENOUS | Status: AC
  Administered 2023-04-25: 0.25 mg via INTRAVENOUS
  Filled 2023-04-25: qty 5

## 2023-04-25 NOTE — Progress Notes (Signed)
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  INTERVAL HISTORY: Patient returns to clinic today for further evaluation and consideration of cycle 3 of FOLFOX plus Keytruda. She complains of back pain which is ongoing. She has gained some weight today and is taking appetite stimulant. She has no neurologic complaints.  She denies any recent fevers or illnesses.  She has no chest pain, shortness of breath, cough, or hemoptysis.  She denies any nausea, vomiting, constipation, or diarrhea.  She has no urinary complaints.  Patient offers no further specific complaints today.  REVIEW OF SYSTEMS:   Review of Systems  Constitutional:  Positive for malaise/fatigue. Negative for fever and weight loss.  Respiratory: Negative.  Negative for cough, hemoptysis and shortness of breath.   Cardiovascular: Negative.  Negative for chest pain and leg swelling.  Gastrointestinal: Negative.  Negative for abdominal pain.  Genitourinary: Negative.  Negative for dysuria and frequency.       Recent menstrual period  Musculoskeletal:  Positive for myalgias. Negative for back pain and falls.       Aching legs, worse at night  Skin: Negative.  Negative for rash.  Neurological: Negative.  Negative for dizziness, focal weakness, weakness and headaches.  Psychiatric/Behavioral: Negative.  Negative for depression. The patient is not nervous/anxious.   As per HPI. Otherwise, a complete review of systems is negative.  PAST MEDICAL HISTORY: Past Medical History:  Diagnosis Date   Anxiety    Cancer (HCC)    Depression    Heart  murmur    Hypertension     PAST SURGICAL HISTORY: Past Surgical History:  Procedure Laterality Date   CESAREAN SECTION     COLONOSCOPY WITH PROPOFOL N/A 02/21/2023   Procedure: COLONOSCOPY WITH PROPOFOL;  Surgeon: Regis Bill, MD;  Location: [facility] ENDOSCOPY;  Service: Endoscopy;  Laterality: N/A;   DILATION AND CURETTAGE OF UTERUS     IR IMAGING GUIDED PORT INSERTION  02/16/2023    FAMILY HISTORY: Family History  Problem Relation Age of Onset   Cancer Maternal Grandmother     ADVANCED DIRECTIVES (Y/N):  N  HEALTH MAINTENANCE: Social History   Tobacco Use   Smoking status: Former    Packs/day: .1    Types: Cigarettes    Quit date: 12/19/2022    Years since quitting: 0.3   Smokeless tobacco: Never   Tobacco comments:    Quit smoking 12/2022  Vaping Use   Vaping Use: Never used  Substance Use Topics   Alcohol use: Yes    Comment: occ   Drug use: No    Colonoscopy:  PAP:  Bone density:  Lipid panel:  Allergies  Allergen Reactions   Nifedipine Palpitations    Current Outpatient Medications  Medication Sig Dispense Refill   albuterol (PROVENTIL) (2.5 MG/3ML) 0.083% nebulizer solution Take 2.5 mg by nebulization every 6 (six) hours as needed for wheezing or shortness of breath.     atenolol-chlorthalidone (TENORETIC) 50-25 MG tablet Take 1 tablet by mouth daily.     atorvastatin (LIPITOR) 10 MG tablet Take 10 mg by mouth daily.     Buprenorphine HCl-Naloxone HCl 4-1 MG FILM Place 4 mg of opioid  under the tongue every 12 (twelve) hours.     cilostazol (PLETAL) 100 MG tablet Take 100 mg by mouth 2 (two) times daily.     cyanocobalamin (VITAMIN B12) 500 MCG tablet Take 500 mcg by mouth daily.     DULoxetine (CYMBALTA) 30 MG capsule Take 30 mg by mouth daily.     gabapentin (NEURONTIN) 300 MG capsule Take 300 mg by mouth 3 (three) times daily.     hydrocortisone 2.5 % cream Apply 1 Application topically 2 (two) times daily.     lidocaine-prilocaine (EMLA) cream  Apply to affected area once 30 g 3   megestrol (MEGACE) 40 MG tablet Take 1 tablet (40 mg total) by mouth daily. 30 tablet 2   Multiple Vitamin (MULTIVITAMIN) tablet Take 1 tablet by mouth daily.     omeprazole (PRILOSEC) 20 MG capsule Take 20 mg by mouth daily.     ondansetron (ZOFRAN) 8 MG tablet Take 1 tablet (8 mg total) by mouth every 8 (eight) hours as needed for nausea or vomiting. Start on the third day after chemotherapy. 60 tablet 2   ondansetron (ZOFRAN-ODT) 4 MG disintegrating tablet Take 4 mg by mouth 2 (two) times daily as needed for nausea or vomiting.     polyethylene glycol (MIRALAX / GLYCOLAX) 17 g packet Take 17 g by mouth daily.     prochlorperazine (COMPAZINE) 10 MG tablet Take 1 tablet (10 mg total) by mouth every 6 (six) hours as needed for nausea or vomiting. 60 tablet 2   SENEXON-S 8.6-50 MG tablet Take 1 tablet by mouth 2 (two) times daily.     tiZANidine (ZANAFLEX) 2 MG tablet Take 2 mg by mouth at bedtime.     traMADol (ULTRAM-ER) 100 MG 24 hr tablet Take 100 mg by mouth daily.     Vitamin D, Ergocalciferol, (DRISDOL) 1.25 MG (50000 UNIT) CAPS capsule Take 50,000 Units by mouth every 7 (seven) days.     cetirizine (ZYRTEC ALLERGY) 10 MG tablet Take 1 tablet (10 mg total) by mouth daily. 30 tablet 0   cyclobenzaprine (FLEXERIL) 10 MG tablet Take 10 mg by mouth 3 (three) times daily as needed for muscle spasms. (Patient not taking: Reported on 02/14/2023)     naloxone [facility]) nasal spray 4 mg/0.1 mL Place 1 spray into the nose once. (Patient not taking: Reported on 02/14/2023)     NIFEdipine (ADALAT CC) 30 MG 24 hr tablet Take 30 mg by mouth daily. (Patient not taking: Reported on 02/14/2023)     oxyCODONE-acetaminophen (PERCOCET/ROXICET) 5-325 MG tablet Take 1 tablet by mouth 2 (two) times daily. (Patient not taking: Reported on 04/04/2023)     potassium chloride (KLOR-CON) 10 MEQ tablet Take 10 mEq by mouth daily. (Patient not taking: Reported on 02/14/2023)     No current  facility-administered medications for this visit.    OBJECTIVE: Vitals:   04/25/23 0858  BP: (!) 140/86  Temp: (!) 96.3 F (35.7 C)      Body mass index is 35.35 kg/m.     ECOG FS:1 - Symptomatic but completely ambulatory  General: Well-developed, well-nourished, no acute distress. Eyes: Pink conjunctiva, anicteric sclera. Lungs: Clear to auscultation bilaterally.  No audible wheezing or coughing Heart: Regular rate and rhythm.  Abdomen: Soft, nontender, nondistended.  Musculoskeletal: No edema, cyanosis, or clubbing. Neuro: Alert, answering all questions appropriately. Cranial nerves grossly intact. Skin: No rashes or petechiae noted. Psych: Normal affect.   LAB RESULTS:  Lab Results  Component Value Date  NA 135 04/25/2023   K 3.5 04/25/2023   CL 108 04/25/2023   CO2 21 (L) 04/25/2023   GLUCOSE 137 (H) 04/25/2023   BUN 15 04/25/2023   CREATININE 0.66 04/25/2023   CALCIUM 8.1 (L) 04/25/2023   PROT 6.8 04/25/2023   ALBUMIN 3.4 (L) 04/25/2023   AST 26 04/25/2023   ALT 19 04/25/2023   ALKPHOS 399 (H) 04/25/2023   BILITOT 0.8 04/25/2023   GFRNONAA >60 04/25/2023   GFRAA >60 09/27/2018    Lab Results  Component Value Date   WBC 7.5 04/25/2023   NEUTROABS 4.7 04/25/2023   HGB 10.2 (L) 04/25/2023   HCT 30.6 (L) 04/25/2023   MCV 91.3 04/25/2023   PLT 184 04/25/2023   Iron/TIBC/Ferritin/ %Sat    Component Value Date/Time   IRON 93 04/25/2023 0834   TIBC 273 04/25/2023 0834   FERRITIN 500 (H) 04/25/2023 0834   IRONPCTSAT 34 (H) 04/25/2023 0834     STUDIES: No results found.  ONCOLOGY: Patient's workup was initiated at [facility] in February 2024 at which point PET scan revealed widely metastatic disease with innumerable sclerotic bony lesions as well as axillary, mediastinal, and abdominal lymphadenopathy.  Subsequent biopsy of a right axillary lymph node revealed signet ring adenocarcinoma, likely GI origin.  Additional testing revealed high tumor  mutational burden.  Further workup at [facility] in April 2024 revealed a negative EGD, and negative CT scanning of the brain.  CT scan of the abdomen and follow-up pelvic ultrasound revealed a large left adnexal mass that was not commented on PET scan from February.  CA 19-9 is normal, but CA125 and CEA are mildly elevated.    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  Repeat PET scan on February 24, 2023 confirmed widespread metastatic disease.  Left adnexal mass is now PET positive of unclear clinical significance and after evaluation by gyn-onc, ultimately would not change the overall treatment plan.  Patient will receive FOLFOX plus Keytruda every 3 weeks for 12 cycles followed by maintenance Keytruda for at least 2 years.  Patient has had port placed. Labs reviewed and acceptable for treatment. Proceed with cycle 3 today. She will return to clinic in 3 days for pump removal and fulphila/GCSF support. She will then return to clinic in 3 weeks for further evaluation and consideration of cycle 4. TSH normal. Monitor in setting of keytruda.  Adnexal mass: PET scan with incidental mass as above. Saw Dr Berchuck/gyn onc 03/02/23. Hold on biopsy or resection of mass given her above metastatic disease and that hse is asymptomatic. If that changes, could have her reevaluated with gyn onc or possible surgery.  Pain: Patient does not complain of this today.  Patient reports that she is seen in the pain clinic in [place], [place].  Can consider radiation oncology in the future if necessary. Bony disease: Zometa on even number cycles as above. Continue calcium 1200 mg and vitamin d 1000 units daily. Calcium 8.1. Encouraged her to check dose of oral calcium as we may need to increase.  Transaminitis: Alk phos elevated likely related to disease and improving. AST  and ALT elevations have resolved.  Anemia: Chronic and unchanged.  Patient's hemoglobin is 10.2 today. Symptomatic of IDA. Add ferritin and iron studies today. Iron stores are well replenished. Likely d/t chemotherapy. Monitor.  Neutropenia: Fulphilia with pump removal as above. Resolved.  Weight loss: Follow-up with dietary as scheduled.  Hypertension: Monitor. Positive hcg- likely related to her tumor vs hertophile antibody. Patient is sexually active, menstruating, and not on birth control however. She verbalizes consent to proceed with treatment today after discussion of potential life threatening or damaging effects to embryo. Megace not likely to cause ovarian suppression. Quantitative hcg is elevated at 66 but not a significant change from 50s in April.  Declines pelvic ultrasound to evaluate for pregnancy. Recommend birth control including abstinence, condoms, OCPs, Depo-Lupron, vs IUD. Discussed pros and cons of each. She will think about these and let us know how she would like to proceed.   Disposition:  Treatment today D3 pump off, fulphila 3 weeks- port/labs, Dr Orlie Dakin, +/- Cycle 4 FOLFOX + Keytruda. D3 Fulphila & pump off - la  Patient expressed understanding and was in agreement with this plan. She also understands that She can call clinic at any time with any questions, concerns, or complaints.    Cancer Staging  Signet ring cell adenocarcinoma [facility] [place]) Staging form: Exocrine Pancreas, AJCC 8th Edition - Clinical stage from 02/07/2023: Stage IV (cTX, cNX, pM1) - Signed by Jeralyn Ruths, MD on 02/14/2023 Stage prefix: Initial diagnosis  Alinda Dooms, NP   04/25/2023   CC: Dr. Orlie Dakin

## 2023-04-25 NOTE — Progress Notes (Signed)
Positive preg test - see NP notes.  Ok to proceed

## 2023-04-25 NOTE — Patient Instructions (Signed)
[place] [facility]  Discharge Instructions: Thank you for choosing [facility] to provide your oncology and hematology care.  If you have a lab appointment with the Cancer Center, please go directly to the Cancer Center and check in at the registration area.  Wear comfortable clothing and clothing appropriate for easy access to any Portacath or PICC line.   We strive to give you quality time with your provider. You may need to reschedule your appointment if you arrive late (15 or more minutes).  Arriving late affects you and other patients whose appointments are after yours.  Also, if you miss three or more appointments without notifying the office, you may be dismissed from the clinic at the provider's discretion.      For prescription refill requests, have your pharmacy contact our office and allow 72 hours for refills to be completed.    Today you received the following chemotherapy and/or immunotherapy agents Keytruda, Oxaliplatin, Leucovorin and Adrucil.    To help prevent nausea and vomiting after your treatment, we encourage you to take your nausea medication as directed.  BELOW ARE SYMPTOMS THAT SHOULD BE REPORTED IMMEDIATELY: *FEVER GREATER THAN 100.4 F (38 C) OR HIGHER *CHILLS OR SWEATING *NAUSEA AND VOMITING THAT IS NOT CONTROLLED WITH YOUR NAUSEA MEDICATION *UNUSUAL SHORTNESS OF BREATH *UNUSUAL BRUISING OR BLEEDING *URINARY PROBLEMS (pain or burning when urinating, or frequent urination) *BOWEL PROBLEMS (unusual diarrhea, constipation, pain near the anus) TENDERNESS IN MOUTH AND THROAT WITH OR WITHOUT PRESENCE OF ULCERS (sore throat, sores in mouth, or a toothache) UNUSUAL RASH, SWELLING OR PAIN  UNUSUAL VAGINAL DISCHARGE OR ITCHING   Items with * indicate a potential emergency and should be followed up as soon as possible or go to the Emergency Department if any problems should occur.  Please show the CHEMOTHERAPY ALERT CARD or  IMMUNOTHERAPY ALERT CARD at check-in to the Emergency Department and triage nurse.  Should you have questions after your visit or need to cancel or reschedule your appointment, please contact [place] [facility]  [phone] and follow the prompts.  Office hours are 8:00 a.m. to 4:30 p.m. Monday - Friday. Please note that voicemails left after 4:00 p.m. may not be returned until the following business day.  We are closed weekends and major holidays. You have access to a nurse at all times for urgent questions. Please call the main number to the clinic [phone] and follow the prompts.  For any non-urgent questions, you may also contact your provider using MyChart. We now offer e-Visits for anyone 67 and older to request care online for non-urgent symptoms. For details visit [email or web address].   Also download the MyChart app! Go to the app store, search "MyChart", open the app, select [place], and log in with your MyChart username and password.

## 2023-04-26 LAB — T4: T4, Total: 6.5 ug/dL (ref 4.5–12.0)

## 2023-04-27 ENCOUNTER — Inpatient Hospital Stay: Payer: 59

## 2023-04-27 VITALS — BP 165/85 | HR 86 | Resp 18

## 2023-04-27 DIAGNOSIS — C801 Malignant (primary) neoplasm, unspecified: Secondary | ICD-10-CM

## 2023-04-27 DIAGNOSIS — Z5112 Encounter for antineoplastic immunotherapy: Secondary | ICD-10-CM | POA: Diagnosis not present

## 2023-04-27 MED ORDER — HEPARIN SOD (PORK) LOCK FLUSH 100 UNIT/ML IV SOLN
500.0000 [IU] | Freq: Once | INTRAVENOUS | Status: AC | PRN
  Administered 2023-04-27: 500 [IU]
  Filled 2023-04-27: qty 5

## 2023-04-27 MED ORDER — SODIUM CHLORIDE 0.9% FLUSH
10.0000 mL | INTRAVENOUS | Status: DC | PRN
  Administered 2023-04-27: 10 mL
  Filled 2023-04-27: qty 10

## 2023-04-27 MED ORDER — PEGFILGRASTIM-JMDB 6 MG/0.6ML ~~LOC~~ SOSY
6.0000 mg | PREFILLED_SYRINGE | Freq: Once | SUBCUTANEOUS | Status: AC
  Administered 2023-04-27: 6 mg via SUBCUTANEOUS
  Filled 2023-04-27: qty 0.6

## 2023-05-03 ENCOUNTER — Other Ambulatory Visit: Payer: Self-pay

## 2023-05-05 ENCOUNTER — Other Ambulatory Visit: Payer: Self-pay

## 2023-05-11 ENCOUNTER — Inpatient Hospital Stay: Payer: 59 | Attending: Oncology

## 2023-05-11 DIAGNOSIS — Z5189 Encounter for other specified aftercare: Secondary | ICD-10-CM | POA: Insufficient documentation

## 2023-05-11 DIAGNOSIS — C801 Malignant (primary) neoplasm, unspecified: Secondary | ICD-10-CM | POA: Insufficient documentation

## 2023-05-11 DIAGNOSIS — Z5112 Encounter for antineoplastic immunotherapy: Secondary | ICD-10-CM | POA: Insufficient documentation

## 2023-05-11 DIAGNOSIS — R9389 Abnormal findings on diagnostic imaging of other specified body structures: Secondary | ICD-10-CM | POA: Insufficient documentation

## 2023-05-11 DIAGNOSIS — M549 Dorsalgia, unspecified: Secondary | ICD-10-CM | POA: Insufficient documentation

## 2023-05-11 DIAGNOSIS — D649 Anemia, unspecified: Secondary | ICD-10-CM | POA: Insufficient documentation

## 2023-05-11 DIAGNOSIS — Z5111 Encounter for antineoplastic chemotherapy: Secondary | ICD-10-CM | POA: Insufficient documentation

## 2023-05-11 DIAGNOSIS — I10 Essential (primary) hypertension: Secondary | ICD-10-CM | POA: Insufficient documentation

## 2023-05-11 DIAGNOSIS — C7951 Secondary malignant neoplasm of bone: Secondary | ICD-10-CM | POA: Insufficient documentation

## 2023-05-11 DIAGNOSIS — D709 Neutropenia, unspecified: Secondary | ICD-10-CM | POA: Insufficient documentation

## 2023-05-11 DIAGNOSIS — Z452 Encounter for adjustment and management of vascular access device: Secondary | ICD-10-CM | POA: Insufficient documentation

## 2023-05-11 NOTE — Progress Notes (Signed)
Nutrition Follow-up:   Patient with metastatic signet ring adenocarcinoma, likely GI origin.  Patient receiving folfox and keytruda.    Spoke with patient via phone.  Reports that appetite is usually 50% while on the pump and then increases back to normal following pump removal.  Reports that she wants to increase her protein in her diet.  Has been drinking ensure shakes  Medications: megace  Labs: glucose 137  Anthropometrics:   Weight 181 lb on 6/24 178 lb 14.4 oz on 6/3 182 lb on 5/20 178 lb on 4/30   NUTRITION DIAGNOSIS: Unintentional weight loss improved    INTERVENTION:  Encouraged high calorie, high protein foods to maintain weight Reviewed foods high in protein. Encouraged protein rich food at every meal Patient provided with one complimentary case of ensure due to one or more of the following reasons:  malnutrition, weight loss/maintenance, poor appetite AND food insecurity or financial hardship.  Patient to pick up on 7/15     MONITORING, EVALUATION, GOAL: weight trends, intake   NEXT VISIT: Wednesday, August 7 at pump removal   Maressa Apollo B. Freida Busman, RD, LDN Registered Dietitian [phone]

## 2023-05-16 ENCOUNTER — Encounter: Payer: Self-pay | Admitting: Oncology

## 2023-05-16 ENCOUNTER — Inpatient Hospital Stay (HOSPITAL_BASED_OUTPATIENT_CLINIC_OR_DEPARTMENT_OTHER): Payer: 59 | Admitting: Oncology

## 2023-05-16 ENCOUNTER — Inpatient Hospital Stay: Payer: 59

## 2023-05-16 DIAGNOSIS — D649 Anemia, unspecified: Secondary | ICD-10-CM | POA: Diagnosis not present

## 2023-05-16 DIAGNOSIS — C801 Malignant (primary) neoplasm, unspecified: Secondary | ICD-10-CM

## 2023-05-16 DIAGNOSIS — Z5111 Encounter for antineoplastic chemotherapy: Secondary | ICD-10-CM | POA: Diagnosis present

## 2023-05-16 DIAGNOSIS — Z5189 Encounter for other specified aftercare: Secondary | ICD-10-CM | POA: Diagnosis not present

## 2023-05-16 DIAGNOSIS — I10 Essential (primary) hypertension: Secondary | ICD-10-CM | POA: Diagnosis not present

## 2023-05-16 DIAGNOSIS — Z5112 Encounter for antineoplastic immunotherapy: Secondary | ICD-10-CM | POA: Diagnosis present

## 2023-05-16 DIAGNOSIS — M549 Dorsalgia, unspecified: Secondary | ICD-10-CM | POA: Diagnosis not present

## 2023-05-16 DIAGNOSIS — D709 Neutropenia, unspecified: Secondary | ICD-10-CM | POA: Diagnosis not present

## 2023-05-16 DIAGNOSIS — C7951 Secondary malignant neoplasm of bone: Secondary | ICD-10-CM | POA: Diagnosis not present

## 2023-05-16 DIAGNOSIS — Z452 Encounter for adjustment and management of vascular access device: Secondary | ICD-10-CM | POA: Diagnosis not present

## 2023-05-16 DIAGNOSIS — R9389 Abnormal findings on diagnostic imaging of other specified body structures: Secondary | ICD-10-CM | POA: Diagnosis not present

## 2023-05-16 LAB — CBC WITH DIFFERENTIAL (CANCER CENTER ONLY)
Abs Immature Granulocytes: 0.27 10*3/uL — ABNORMAL HIGH (ref 0.00–0.07)
Basophils Absolute: 0.1 10*3/uL (ref 0.0–0.1)
Basophils Relative: 1 %
Eosinophils Absolute: 0.2 10*3/uL (ref 0.0–0.5)
Eosinophils Relative: 3 %
HCT: 30.1 % — ABNORMAL LOW (ref 36.0–46.0)
Hemoglobin: 10 g/dL — ABNORMAL LOW (ref 12.0–15.0)
Immature Granulocytes: 3 %
Lymphocytes Relative: 22 %
Lymphs Abs: 1.9 10*3/uL (ref 0.7–4.0)
MCH: 30.7 pg (ref 26.0–34.0)
MCHC: 33.2 g/dL (ref 30.0–36.0)
MCV: 92.3 fL (ref 80.0–100.0)
Monocytes Absolute: 0.8 10*3/uL (ref 0.1–1.0)
Monocytes Relative: 9 %
Neutro Abs: 5.4 10*3/uL (ref 1.7–7.7)
Neutrophils Relative %: 62 %
Platelet Count: 191 10*3/uL (ref 150–400)
RBC: 3.26 MIL/uL — ABNORMAL LOW (ref 3.87–5.11)
RDW: 16.5 % — ABNORMAL HIGH (ref 11.5–15.5)
WBC Count: 8.7 10*3/uL (ref 4.0–10.5)
nRBC: 0.7 % — ABNORMAL HIGH (ref 0.0–0.2)

## 2023-05-16 LAB — CMP (CANCER CENTER ONLY)
ALT: 21 U/L (ref 0–44)
AST: 31 U/L (ref 15–41)
Albumin: 3.5 g/dL (ref 3.5–5.0)
Alkaline Phosphatase: 426 U/L — ABNORMAL HIGH (ref 38–126)
Anion gap: 6 (ref 5–15)
BUN: 16 mg/dL (ref 6–20)
CO2: 19 mmol/L — ABNORMAL LOW (ref 22–32)
Calcium: 8.5 mg/dL — ABNORMAL LOW (ref 8.9–10.3)
Chloride: 111 mmol/L (ref 98–111)
Creatinine: 0.9 mg/dL (ref 0.44–1.00)
GFR, Estimated: >60 mL/min (ref >60)
Glucose, Bld: 136 mg/dL — ABNORMAL HIGH (ref 70–99)
Potassium: 3.6 mmol/L (ref 3.5–5.1)
Sodium: 136 mmol/L (ref 135–145)
Total Bilirubin: 0.5 mg/dL (ref 0.3–1.2)
Total Protein: 6.8 g/dL (ref 6.5–8.1)

## 2023-05-16 MED ORDER — OXALIPLATIN CHEMO INJECTION 100 MG/20ML
85.0000 mg/m2 | Freq: Once | INTRAVENOUS | Status: AC
  Administered 2023-05-16: 150 mg via INTRAVENOUS
  Filled 2023-05-16: qty 10

## 2023-05-16 MED ORDER — SODIUM CHLORIDE 0.9 % IV SOLN
200.0000 mg | Freq: Once | INTRAVENOUS | Status: AC
  Administered 2023-05-16: 200 mg via INTRAVENOUS
  Filled 2023-05-16: qty 200

## 2023-05-16 MED ORDER — LEUCOVORIN CALCIUM INJECTION 350 MG
400.0000 mg/m2 | Freq: Once | INTRAVENOUS | Status: AC
  Administered 2023-05-16: 748 mg via INTRAVENOUS
  Filled 2023-05-16: qty 37.4

## 2023-05-16 MED ORDER — DEXTROSE 5 % IV SOLN
Freq: Once | INTRAVENOUS | Status: AC
  Filled 2023-05-16: qty 250

## 2023-05-16 MED ORDER — PALONOSETRON HCL INJECTION 0.25 MG/5ML
0.2500 mg | Freq: Once | INTRAVENOUS | Status: AC
  Administered 2023-05-16: 0.25 mg via INTRAVENOUS
  Filled 2023-05-16: qty 5

## 2023-05-16 MED ORDER — SODIUM CHLORIDE 0.9 % IV SOLN
2400.0000 mg/m2 | INTRAVENOUS | Status: DC
  Administered 2023-05-16: 4500 mg via INTRAVENOUS
  Filled 2023-05-16: qty 90

## 2023-05-16 MED ORDER — FLUOROURACIL CHEMO INJECTION 2.5 GM/50ML
400.0000 mg/m2 | Freq: Once | INTRAVENOUS | Status: AC
  Administered 2023-05-16: 750 mg via INTRAVENOUS
  Filled 2023-05-16: qty 15

## 2023-05-16 MED ORDER — ZOLEDRONIC ACID 4 MG/100ML IV SOLN
4.0000 mg | Freq: Once | INTRAVENOUS | Status: AC
  Administered 2023-05-16: 4 mg via INTRAVENOUS
  Filled 2023-05-16: qty 100

## 2023-05-16 MED ORDER — SODIUM CHLORIDE 0.9 % IV SOLN
Freq: Once | INTRAVENOUS | Status: AC
  Filled 2023-05-16: qty 250

## 2023-05-16 MED ORDER — SODIUM CHLORIDE 0.9 % IV SOLN
10.0000 mg | Freq: Once | INTRAVENOUS | Status: AC
  Administered 2023-05-16: 10 mg via INTRAVENOUS
  Filled 2023-05-16: qty 1

## 2023-05-16 NOTE — Patient Instructions (Signed)
[place] [facility]  Discharge Instructions: Thank you for choosing [facility] to provide your oncology and hematology care.  If you have a lab appointment with the Cancer Center, please go directly to the Cancer Center and check in at the registration area.  Wear comfortable clothing and clothing appropriate for easy access to any Portacath or PICC line.   We strive to give you quality time with your provider. You may need to reschedule your appointment if you arrive late (15 or more minutes).  Arriving late affects you and other patients whose appointments are after yours.  Also, if you miss three or more appointments without notifying the office, you may be dismissed from the clinic at the provider's discretion.      For prescription refill requests, have your pharmacy contact our office and allow 72 hours for refills to be completed.    Today you received the following chemotherapy and/or immunotherapy agents Keytruda, oxaliplatin, adrucil    To help prevent nausea and vomiting after your treatment, we encourage you to take your nausea medication as directed.  BELOW ARE SYMPTOMS THAT SHOULD BE REPORTED IMMEDIATELY: *FEVER GREATER THAN 100.4 F (38 C) OR HIGHER *CHILLS OR SWEATING *NAUSEA AND VOMITING THAT IS NOT CONTROLLED WITH YOUR NAUSEA MEDICATION *UNUSUAL SHORTNESS OF BREATH *UNUSUAL BRUISING OR BLEEDING *URINARY PROBLEMS (pain or burning when urinating, or frequent urination) *BOWEL PROBLEMS (unusual diarrhea, constipation, pain near the anus) TENDERNESS IN MOUTH AND THROAT WITH OR WITHOUT PRESENCE OF ULCERS (sore throat, sores in mouth, or a toothache) UNUSUAL RASH, SWELLING OR PAIN  UNUSUAL VAGINAL DISCHARGE OR ITCHING   Items with * indicate a potential emergency and should be followed up as soon as possible or go to the Emergency Department if any problems should occur.  Please show the CHEMOTHERAPY ALERT CARD or IMMUNOTHERAPY ALERT  CARD at check-in to the Emergency Department and triage nurse.  Should you have questions after your visit or need to cancel or reschedule your appointment, please contact [place] [facility]  [phone] and follow the prompts.  Office hours are 8:00 a.m. to 4:30 p.m. Monday - Friday. Please note that voicemails left after 4:00 p.m. may not be returned until the following business day.  We are closed weekends and major holidays. You have access to a nurse at all times for urgent questions. Please call the main number to the clinic [phone] and follow the prompts.  For any non-urgent questions, you may also contact your provider using MyChart. We now offer e-Visits for anyone 74 and older to request care online for non-urgent symptoms. For details visit [email or web address].   Also download the MyChart app! Go to the app store, search "MyChart", open the app, select [place], and log in with your MyChart username and password.

## 2023-05-16 NOTE — Progress Notes (Signed)
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  INTERVAL HISTORY: Patient returns to clinic today for further evaluation and consideration of cycle 4 of FOLFOX plus Keytruda.  She continues to have chronic back and leg pain, but otherwise feels well and is tolerating her treatments without significant side effects.  She has an improved appetite.  She has no neurologic complaints.  She denies any recent fevers or illnesses.  She has no chest pain, shortness of breath, cough, or hemoptysis.  She denies any nausea, vomiting, constipation, or diarrhea.  She has no urinary complaints.  Patient offers no further specific complaints today.    REVIEW OF SYSTEMS:   Review of Systems  Constitutional:  Positive for malaise/fatigue. Negative for fever and weight loss.  Respiratory: Negative.  Negative for cough, hemoptysis and shortness of breath.   Cardiovascular: Negative.  Negative for chest pain and leg swelling.  Gastrointestinal: Negative.  Negative for abdominal pain.  Genitourinary: Negative.  Negative for dysuria and frequency.  Musculoskeletal:  Positive for back pain.  Skin: Negative.  Negative for rash.  Neurological: Negative.  Negative for dizziness, focal weakness, weakness and headaches.  Psychiatric/Behavioral: Negative.  The patient is not nervous/anxious.     As per HPI. Otherwise, a complete review of systems is negative.  PAST MEDICAL HISTORY: Past Medical History:  Diagnosis Date   Anxiety    Cancer (HCC)    Depression    Heart murmur    Hypertension     PAST SURGICAL  HISTORY: Past Surgical History:  Procedure Laterality Date   CESAREAN SECTION     COLONOSCOPY WITH PROPOFOL N/A 02/21/2023   Procedure: COLONOSCOPY WITH PROPOFOL;  Surgeon: Regis Bill, MD;  Location: [facility] ENDOSCOPY;  Service: Endoscopy;  Laterality: N/A;   DILATION AND CURETTAGE OF UTERUS     IR IMAGING GUIDED PORT INSERTION  02/16/2023    FAMILY HISTORY: Family History  Problem Relation Age of Onset   Cancer Maternal Grandmother     ADVANCED DIRECTIVES (Y/N):  N  HEALTH MAINTENANCE: Social History   Tobacco Use   Smoking status: Former    Current packs/day: 0.00    Types: Cigarettes    Quit date: 12/19/2022    Years since quitting: 0.4   Smokeless tobacco: Never   Tobacco comments:    Quit smoking 12/2022  Vaping Use   Vaping status: Never Used  Substance Use Topics   Alcohol use: Yes    Comment: occ   Drug use: No     Colonoscopy:  PAP:  Bone density:  Lipid panel:  Allergies  Allergen Reactions   Nifedipine Palpitations    Current Outpatient Medications  Medication Sig Dispense Refill   albuterol (PROVENTIL) (2.5 MG/3ML) 0.083% nebulizer solution Take 2.5 mg by nebulization every 6 (six) hours as needed for wheezing or shortness of breath.     atenolol-chlorthalidone (TENORETIC) 50-25 MG tablet Take 1 tablet by mouth daily.     atorvastatin (LIPITOR) 10 MG tablet Take 10 mg by mouth daily.     Buprenorphine HCl-Naloxone HCl 4-1 MG FILM Place 4 mg of opioid under the tongue every 12 (twelve) hours.  cilostazol (PLETAL) 100 MG tablet Take 100 mg by mouth 2 (two) times daily.     cyanocobalamin (VITAMIN B12) 500 MCG tablet Take 500 mcg by mouth daily.     DULoxetine (CYMBALTA) 30 MG capsule Take 30 mg by mouth daily.     gabapentin (NEURONTIN) 300 MG capsule Take 300 mg by mouth 3 (three) times daily.     hydrocortisone 2.5 % cream Apply 1 Application topically 2 (two) times daily.     lidocaine-prilocaine (EMLA) cream Apply to affected area once 30  g 3   megestrol (MEGACE) 40 MG tablet Take 1 tablet (40 mg total) by mouth daily. 30 tablet 2   Multiple Vitamin (MULTIVITAMIN) tablet Take 1 tablet by mouth daily.     omeprazole (PRILOSEC) 20 MG capsule Take 20 mg by mouth daily.     ondansetron (ZOFRAN) 8 MG tablet Take 1 tablet (8 mg total) by mouth every 8 (eight) hours as needed for nausea or vomiting. Start on the third day after chemotherapy. 60 tablet 2   ondansetron (ZOFRAN-ODT) 4 MG disintegrating tablet Take 4 mg by mouth 2 (two) times daily as needed for nausea or vomiting.     polyethylene glycol (MIRALAX / GLYCOLAX) 17 g packet Take 17 g by mouth daily.     prochlorperazine (COMPAZINE) 10 MG tablet Take 1 tablet (10 mg total) by mouth every 6 (six) hours as needed for nausea or vomiting. 60 tablet 2   SENEXON-S 8.6-50 MG tablet Take 1 tablet by mouth 2 (two) times daily.     tiZANidine (ZANAFLEX) 2 MG tablet Take 2 mg by mouth at bedtime.     traMADol (ULTRAM-ER) 100 MG 24 hr tablet Take 100 mg by mouth daily.     Vitamin D, Ergocalciferol, (DRISDOL) 1.25 MG (50000 UNIT) CAPS capsule Take 50,000 Units by mouth every 7 (seven) days.     cetirizine (ZYRTEC ALLERGY) 10 MG tablet Take 1 tablet (10 mg total) by mouth daily. 30 tablet 0   cyclobenzaprine (FLEXERIL) 10 MG tablet Take 10 mg by mouth 3 (three) times daily as needed for muscle spasms. (Patient not taking: Reported on 02/14/2023)     naloxone [facility]) nasal spray 4 mg/0.1 mL Place 1 spray into the nose once. (Patient not taking: Reported on 02/14/2023)     NIFEdipine (ADALAT CC) 30 MG 24 hr tablet Take 30 mg by mouth daily. (Patient not taking: Reported on 02/14/2023)     oxyCODONE-acetaminophen (PERCOCET/ROXICET) 5-325 MG tablet Take 1 tablet by mouth 2 (two) times daily. (Patient not taking: Reported on 04/04/2023)     potassium chloride (KLOR-CON) 10 MEQ tablet Take 10 mEq by mouth daily. (Patient not taking: Reported on 02/14/2023)     No current facility-administered  medications for this visit.   Facility-Administered Medications Ordered in Other Visits  Medication Dose Route Frequency Provider Last Rate Last Admin   dexamethasone (DECADRON) 10 mg in sodium chloride 0.9 % 50 mL IVPB  10 mg Intravenous Once Orlie Dakin, Tollie Pizza, MD       dextrose 5 % solution   Intravenous Once Orlie Dakin, Tollie Pizza, MD       fluorouracil (ADRUCIL) 4,500 mg in sodium chloride 0.9 % 60 mL chemo infusion  2,400 mg/m2 (Treatment Plan Recorded) Intravenous 1 day or 1 dose Jeralyn Ruths, MD       fluorouracil (ADRUCIL) chemo injection 750 mg  400 mg/m2 (Treatment Plan Recorded) Intravenous Once Jeralyn Ruths, MD       leucovorin  748 mg in dextrose 5 % 250 mL infusion  400 mg/m2 (Treatment Plan Recorded) Intravenous Once Jeralyn Ruths, MD       oxaliplatin (ELOXATIN) 150 mg in dextrose 5 % 500 mL chemo infusion  85 mg/m2 (Treatment Plan Recorded) Intravenous Once Jeralyn Ruths, MD       palonosetron (ALOXI) injection 0.25 mg  0.25 mg Intravenous Once Jeralyn Ruths, MD       pembrolizumab [facility]) 200 mg in sodium chloride 0.9 % 50 mL chemo infusion  200 mg Intravenous Once Jeralyn Ruths, MD        OBJECTIVE: Vitals:   05/16/23 0840  BP: (!) 151/95  Pulse: 88  Temp: (!) 97.2 F (36.2 C)  SpO2: 100%      Body mass index is 35.94 kg/m.    ECOG FS:1 - Symptomatic but completely ambulatory  General: Well-developed, well-nourished, no acute distress. Eyes: Pink conjunctiva, anicteric sclera. HEENT: Normocephalic, moist mucous membranes. Lungs: No audible wheezing or coughing. Heart: Regular rate and rhythm. Abdomen: Soft, nontender, no obvious distention. Musculoskeletal: No edema, cyanosis, or clubbing. Neuro: Alert, answering all questions appropriately. Cranial nerves grossly intact. Skin: No rashes or petechiae noted. Psych: Normal affect.  LAB RESULTS:  Lab Results  Component Value Date   NA 136 05/16/2023   K 3.6 05/16/2023    CL 111 05/16/2023   CO2 19 (L) 05/16/2023   GLUCOSE 136 (H) 05/16/2023   BUN 16 05/16/2023   CREATININE 0.90 05/16/2023   CALCIUM 8.5 (L) 05/16/2023   PROT 6.8 05/16/2023   ALBUMIN 3.5 05/16/2023   AST 31 05/16/2023   ALT 21 05/16/2023   ALKPHOS 426 (H) 05/16/2023   BILITOT 0.5 05/16/2023   GFRNONAA >60 05/16/2023   GFRAA >60 09/27/2018    Lab Results  Component Value Date   WBC 8.7 05/16/2023   NEUTROABS 5.4 05/16/2023   HGB 10.0 (L) 05/16/2023   HCT 30.1 (L) 05/16/2023   MCV 92.3 05/16/2023   PLT 191 05/16/2023     STUDIES: No results found.  ONCOLOGY: Patient's workup was initiated at [facility] in February 2024 at which point PET scan revealed widely metastatic disease with innumerable sclerotic bony lesions as well as axillary, mediastinal, and abdominal lymphadenopathy.  Subsequent biopsy of a right axillary lymph node revealed signet ring adenocarcinoma, likely GI origin.  Additional testing revealed high tumor mutational burden.  Further workup at [facility] in April 2024 revealed a negative EGD, and negative CT scanning of the brain.  CT scan of the abdomen and follow-up pelvic ultrasound revealed a large left adnexal mass that was not commented on PET scan from February.  CA 19-9 is normal, but CA125 and CEA are mildly elevated.    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  Repeat PET scan on February 24, 2023 reviewed independently confirming widespread metastatic disease.  Left adnexal mass is now PET positive of unclear clinical significance and after evaluation by gyn-onc, ultimately would not change the overall treatment plan.  Patient will receive FOLFOX plus Keytruda every 3 weeks for 12 cycles followed by maintenance Keytruda for at least 2 years.  Patient also received Zometa on even number cycles.  She has  had had port placed.  Proceed with cycle 4 today.  Return to clinic in 2 days for pump removal and fulphilia.  She will then return  to clinic in 3 weeks for further evaluation and consideration of cycle 5.  Plan to reimage at the conclusion of cycle 6. Adnexal mass: PET scan as above.  Awaiting pathology from outside biopsy for further evaluation.  Appreciate gyn-onc input. Pain: Patient's back pain is worse, but states she is off oxycodone.  She has an appointment later this week with her physician in the pain clinic in [place], [place].  Can consider radiation oncology in the future if necessary. Bony disease: Zometa on even number cycles as above. Transaminitis: Resolved. Anemia: Hemoglobin has trended down slightly to 10.0.  Monitor.   Neutropenia: Continue Fulphilia with pump removal as above. Weight loss: Resolved.  Patient reports she is now gaining weight. Hypertension: Chronic and unchanged. 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 Patient is sexually active, menstruating, and not on birth control however. She verbalizes consent to proceed with treatment after previous discussion of potential life threatening or damaging effects to embryo. Megace not likely to cause ovarian suppression.  Patient also previously declined pelvic ultrasound to evaluate for pregnancy.  Patient previously discussed with nurse practitioner birth control including abstinence, condoms, OCPs, Depo-Lupron, vs IUD. Discussed pros and cons of each. She will think about these and let us know how she would like to proceed.   Patient expressed understanding and was in agreement with this plan. She also understands that She can call clinic at any time with any questions, concerns, or complaints.    Cancer Staging  Signet ring cell adenocarcinoma [facility]) Staging form: Exocrine Pancreas, AJCC 8th Edition - Clinical stage from 02/07/2023: Stage IV (cTX, cNX, pM1) - Signed by Jeralyn Ruths, MD  on 02/14/2023 Stage prefix: Initial diagnosis   Jeralyn Ruths, MD   05/16/2023 10:18 AM

## 2023-05-18 ENCOUNTER — Inpatient Hospital Stay: Payer: 59

## 2023-05-18 VITALS — BP 143/92 | HR 100 | Temp 97.5°F | Resp 16

## 2023-05-18 DIAGNOSIS — C801 Malignant (primary) neoplasm, unspecified: Secondary | ICD-10-CM

## 2023-05-18 DIAGNOSIS — Z5112 Encounter for antineoplastic immunotherapy: Secondary | ICD-10-CM | POA: Diagnosis not present

## 2023-05-18 MED ORDER — PEGFILGRASTIM-JMDB 6 MG/0.6ML ~~LOC~~ SOSY
6.0000 mg | PREFILLED_SYRINGE | Freq: Once | SUBCUTANEOUS | Status: AC
  Administered 2023-05-18: 6 mg via SUBCUTANEOUS

## 2023-05-18 MED ORDER — SODIUM CHLORIDE 0.9% FLUSH
10.0000 mL | INTRAVENOUS | Status: DC | PRN
  Administered 2023-05-18: 10 mL
  Filled 2023-05-18: qty 10

## 2023-05-18 MED ORDER — HEPARIN SOD (PORK) LOCK FLUSH 100 UNIT/ML IV SOLN
500.0000 [IU] | Freq: Once | INTRAVENOUS | Status: AC | PRN
  Administered 2023-05-18: 500 [IU]
  Filled 2023-05-18: qty 5

## 2023-05-18 NOTE — Patient Instructions (Signed)

## 2023-05-20 ENCOUNTER — Inpatient Hospital Stay (HOSPITAL_BASED_OUTPATIENT_CLINIC_OR_DEPARTMENT_OTHER): Payer: 59 | Admitting: Hospice and Palliative Medicine

## 2023-05-20 DIAGNOSIS — G893 Neoplasm related pain (acute) (chronic): Secondary | ICD-10-CM

## 2023-05-20 DIAGNOSIS — C801 Malignant (primary) neoplasm, unspecified: Secondary | ICD-10-CM

## 2023-05-20 DIAGNOSIS — Z515 Encounter for palliative care: Secondary | ICD-10-CM

## 2023-05-20 NOTE — Progress Notes (Signed)
Virtual Visit via Telephone Note  I connected with Sheryl Silva on 05/20/23 at  1:20 PM EDT by telephone and verified that I am speaking with the correct person using two identifiers.  Location: Patient: Home Provider: Clinic   I discussed the limitations, risks, security and privacy concerns of performing an evaluation and management service by telephone and the availability of in person appointments. I also discussed with the patient that there may be a patient responsible charge related to this service. The patient expressed understanding and agreed to proceed.   History of Present Illness: Sheryl Silva is a 47 year old woman with multiple medical problems including metastatic signet ring adenocarcinoma of likely GI origin.  Patient has known skeletal metastases and has had ongoing pain.  She also has chronic pain and is followed by pain clinic in [place]. She was referred to palliative care to address goals and manage ongoing symptoms.   Observations/Objective: I spoke with patient by phone.    She reports that her pain has been worse recently but she was able to speak with her pain physician (Dr. Chriss Driver in [place]) and he started her on oxycodone 15mg  tablets for breakthrough pain and has increased her tramadol XR to 200mg . She feels this change has been helpful. She says she intends to ask about Percocet instead of the oxycodone IR as she feels the acetaminophen dose helps. She sees her pain physician again in 2 weeks.   Patient denies any other distressing symptoms or concerns. Otherwise, she feels she is doing well.   Assessment and Plan: Stage IV metastatic signet ring adenocarcinoma -on treatment with FOLFOX plus Keytruda  Neoplasm related pain -on Ultram ER + oxycodone.  Patient is followed by pain clinic.   Follow Up Instructions: Follow-up telephone visit 1 month   I discussed the assessment and treatment plan with the patient. The patient was provided an opportunity to  ask questions and all were answered. The patient agreed with the plan and demonstrated an understanding of the instructions.   The patient was advised to call back or seek an in-person evaluation if the symptoms worsen or if the condition fails to improve as anticipated.  I provided 10 minutes of non-face-to-face time during this encounter.   Malachy Moan, NP

## 2023-05-23 ENCOUNTER — Other Ambulatory Visit: Payer: Self-pay

## 2023-06-01 ENCOUNTER — Encounter: Payer: Self-pay | Admitting: Oncology

## 2023-06-06 ENCOUNTER — Inpatient Hospital Stay (HOSPITAL_BASED_OUTPATIENT_CLINIC_OR_DEPARTMENT_OTHER): Payer: 59 | Admitting: Nurse Practitioner

## 2023-06-06 ENCOUNTER — Inpatient Hospital Stay: Payer: 59

## 2023-06-06 ENCOUNTER — Other Ambulatory Visit: Payer: Self-pay

## 2023-06-06 ENCOUNTER — Encounter: Payer: Self-pay | Admitting: Nurse Practitioner

## 2023-06-06 VITALS — BP 108/74 | HR 95 | Temp 97.4°F | Wt 181.0 lb

## 2023-06-06 DIAGNOSIS — C7951 Secondary malignant neoplasm of bone: Secondary | ICD-10-CM

## 2023-06-06 DIAGNOSIS — R197 Diarrhea, unspecified: Secondary | ICD-10-CM | POA: Insufficient documentation

## 2023-06-06 DIAGNOSIS — R748 Abnormal levels of other serum enzymes: Secondary | ICD-10-CM | POA: Insufficient documentation

## 2023-06-06 DIAGNOSIS — C801 Malignant (primary) neoplasm, unspecified: Secondary | ICD-10-CM

## 2023-06-06 DIAGNOSIS — M79606 Pain in leg, unspecified: Secondary | ICD-10-CM | POA: Insufficient documentation

## 2023-06-06 DIAGNOSIS — G893 Neoplasm related pain (acute) (chronic): Secondary | ICD-10-CM | POA: Diagnosis not present

## 2023-06-06 DIAGNOSIS — Z87891 Personal history of nicotine dependence: Secondary | ICD-10-CM | POA: Diagnosis not present

## 2023-06-06 DIAGNOSIS — Z7689 Persons encountering health services in other specified circumstances: Secondary | ICD-10-CM | POA: Insufficient documentation

## 2023-06-06 DIAGNOSIS — D649 Anemia, unspecified: Secondary | ICD-10-CM | POA: Insufficient documentation

## 2023-06-06 DIAGNOSIS — Z6834 Body mass index (BMI) 34.0-34.9, adult: Secondary | ICD-10-CM | POA: Insufficient documentation

## 2023-06-06 DIAGNOSIS — Z5111 Encounter for antineoplastic chemotherapy: Secondary | ICD-10-CM | POA: Insufficient documentation

## 2023-06-06 DIAGNOSIS — Z5189 Encounter for other specified aftercare: Secondary | ICD-10-CM | POA: Insufficient documentation

## 2023-06-06 DIAGNOSIS — R630 Anorexia: Secondary | ICD-10-CM | POA: Insufficient documentation

## 2023-06-06 DIAGNOSIS — R7401 Elevation of levels of liver transaminase levels: Secondary | ICD-10-CM | POA: Insufficient documentation

## 2023-06-06 DIAGNOSIS — E860 Dehydration: Secondary | ICD-10-CM | POA: Insufficient documentation

## 2023-06-06 DIAGNOSIS — I10 Essential (primary) hypertension: Secondary | ICD-10-CM | POA: Insufficient documentation

## 2023-06-06 DIAGNOSIS — R110 Nausea: Secondary | ICD-10-CM | POA: Insufficient documentation

## 2023-06-06 DIAGNOSIS — Z510 Encounter for antineoplastic radiation therapy: Secondary | ICD-10-CM | POA: Insufficient documentation

## 2023-06-06 DIAGNOSIS — C778 Secondary and unspecified malignant neoplasm of lymph nodes of multiple regions: Secondary | ICD-10-CM | POA: Insufficient documentation

## 2023-06-06 DIAGNOSIS — Z5112 Encounter for antineoplastic immunotherapy: Secondary | ICD-10-CM | POA: Insufficient documentation

## 2023-06-06 DIAGNOSIS — Z452 Encounter for adjustment and management of vascular access device: Secondary | ICD-10-CM | POA: Insufficient documentation

## 2023-06-06 LAB — CBC WITH DIFFERENTIAL (CANCER CENTER ONLY)
Abs Immature Granulocytes: 0.28 10*3/uL — ABNORMAL HIGH (ref 0.00–0.07)
Basophils Absolute: 0.1 10*3/uL (ref 0.0–0.1)
Basophils Relative: 1 %
Eosinophils Absolute: 0.2 10*3/uL (ref 0.0–0.5)
Eosinophils Relative: 2 %
HCT: 30.4 % — ABNORMAL LOW (ref 36.0–46.0)
Hemoglobin: 10 g/dL — ABNORMAL LOW (ref 12.0–15.0)
Immature Granulocytes: 3 %
Lymphocytes Relative: 18 %
Lymphs Abs: 1.7 10*3/uL (ref 0.7–4.0)
MCH: 30.6 pg (ref 26.0–34.0)
MCHC: 32.9 g/dL (ref 30.0–36.0)
MCV: 93 fL (ref 80.0–100.0)
Monocytes Absolute: 1 10*3/uL (ref 0.1–1.0)
Monocytes Relative: 10 %
Neutro Abs: 6.4 10*3/uL (ref 1.7–7.7)
Neutrophils Relative %: 66 %
Platelet Count: 177 10*3/uL (ref 150–400)
RBC: 3.27 MIL/uL — ABNORMAL LOW (ref 3.87–5.11)
RDW: 17.2 % — ABNORMAL HIGH (ref 11.5–15.5)
WBC Count: 9.7 10*3/uL (ref 4.0–10.5)
nRBC: 0.8 % — ABNORMAL HIGH (ref 0.0–0.2)

## 2023-06-06 LAB — CMP (CANCER CENTER ONLY)
ALT: 55 U/L — ABNORMAL HIGH (ref 0–44)
AST: 28 U/L (ref 15–41)
Albumin: 3.5 g/dL (ref 3.5–5.0)
Alkaline Phosphatase: 382 U/L — ABNORMAL HIGH (ref 38–126)
Anion gap: 7 (ref 5–15)
BUN: 17 mg/dL (ref 6–20)
CO2: 19 mmol/L — ABNORMAL LOW (ref 22–32)
Calcium: 8.6 mg/dL — ABNORMAL LOW (ref 8.9–10.3)
Chloride: 106 mmol/L (ref 98–111)
Creatinine: 1.01 mg/dL — ABNORMAL HIGH (ref 0.44–1.00)
GFR, Estimated: >60 mL/min (ref >60)
Glucose, Bld: 130 mg/dL — ABNORMAL HIGH (ref 70–99)
Potassium: 3.9 mmol/L (ref 3.5–5.1)
Sodium: 132 mmol/L — ABNORMAL LOW (ref 135–145)
Total Bilirubin: 0.4 mg/dL (ref 0.3–1.2)
Total Protein: 6.8 g/dL (ref 6.5–8.1)

## 2023-06-06 LAB — PREGNANCY, URINE: Preg Test, Ur: POSITIVE — AB

## 2023-06-06 LAB — HCG, QUANTITATIVE, PREGNANCY: hCG, Beta Chain, Quant, S: 73 m[IU]/mL — ABNORMAL HIGH (ref <5)

## 2023-06-06 LAB — TSH: TSH: 1.962 u[IU]/mL (ref 0.350–4.500)

## 2023-06-06 MED ORDER — OXALIPLATIN CHEMO INJECTION 100 MG/20ML
85.0000 mg/m2 | Freq: Once | INTRAVENOUS | Status: AC
  Administered 2023-06-06: 150 mg via INTRAVENOUS
  Filled 2023-06-06: qty 10

## 2023-06-06 MED ORDER — SODIUM CHLORIDE 0.9 % IV SOLN
2400.0000 mg/m2 | INTRAVENOUS | Status: DC
  Administered 2023-06-06: 4500 mg via INTRAVENOUS
  Filled 2023-06-06: qty 90

## 2023-06-06 MED ORDER — SODIUM CHLORIDE 0.9 % IV SOLN
200.0000 mg | Freq: Once | INTRAVENOUS | Status: AC
  Administered 2023-06-06: 200 mg via INTRAVENOUS
  Filled 2023-06-06: qty 8

## 2023-06-06 MED ORDER — SODIUM CHLORIDE 0.9 % IV SOLN
10.0000 mg | Freq: Once | INTRAVENOUS | Status: AC
  Administered 2023-06-06: 10 mg via INTRAVENOUS
  Filled 2023-06-06: qty 10

## 2023-06-06 MED ORDER — FLUOROURACIL CHEMO INJECTION 2.5 GM/50ML
400.0000 mg/m2 | Freq: Once | INTRAVENOUS | Status: AC
  Administered 2023-06-06: 750 mg via INTRAVENOUS
  Filled 2023-06-06: qty 15

## 2023-06-06 MED ORDER — PALONOSETRON HCL INJECTION 0.25 MG/5ML
0.2500 mg | Freq: Once | INTRAVENOUS | Status: AC
  Administered 2023-06-06: 0.25 mg via INTRAVENOUS
  Filled 2023-06-06: qty 5

## 2023-06-06 MED ORDER — LEUCOVORIN CALCIUM INJECTION 350 MG
400.0000 mg/m2 | Freq: Once | INTRAVENOUS | Status: AC
  Administered 2023-06-06: 748 mg via INTRAVENOUS
  Filled 2023-06-06: qty 37.4

## 2023-06-06 MED ORDER — DEXTROSE 5 % IV SOLN
Freq: Once | INTRAVENOUS | Status: AC
  Filled 2023-06-06: qty 250

## 2023-06-06 NOTE — Progress Notes (Signed)
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  INTERVAL HISTORY: Patient returns to clinic today for further evaluation and consideration of cycle 5 of FOLFOX plus Keytruda with fulphila support. Complains of leg pain. Localizes to right upper leg. Rates 10/10. She has no neurologic complaints.  She denies any recent fevers or illnesses.  She has no chest pain, shortness of breath, cough, or hemoptysis.  She denies any nausea, vomiting, constipation, or diarrhea.  She has no urinary complaints.  Patient offers no further specific complaints today.  REVIEW OF SYSTEMS:   Review of Systems  Constitutional:  Positive for malaise/fatigue. Negative for fever and weight loss.  Respiratory: Negative.  Negative for cough, hemoptysis and shortness of breath.   Cardiovascular: Negative.  Negative for chest pain and leg swelling.  Gastrointestinal: Negative.  Negative for abdominal pain, blood in stool, constipation, diarrhea, heartburn, melena, nausea and vomiting.  Genitourinary: Negative.  Negative for dysuria and frequency.       Recent menstrual period  Musculoskeletal:  Positive for myalgias (right upper leg pain). Negative for back pain and falls.       Pain of legs worse at night.   Skin: Negative.  Negative for rash.  Neurological: Negative.  Negative for dizziness, focal weakness, weakness and headaches.  Psychiatric/Behavioral: Negative.  Negative for depression. The patient is not nervous/anxious.   As per HPI. Otherwise, a complete review of systems is negative.  PAST MEDICAL HISTORY: Past  Medical History:  Diagnosis Date   Anxiety    Cancer (HCC)    Depression    Heart murmur    Hypertension     PAST SURGICAL HISTORY: Past Surgical History:  Procedure Laterality Date   CESAREAN SECTION     COLONOSCOPY WITH PROPOFOL N/A 02/21/2023   Procedure: COLONOSCOPY WITH PROPOFOL;  Surgeon: Regis Bill, MD;  Location: [facility] ENDOSCOPY;  Service: Endoscopy;  Laterality: N/A;   DILATION AND CURETTAGE OF UTERUS     IR IMAGING GUIDED PORT INSERTION  02/16/2023    FAMILY HISTORY: Family History  Problem Relation Age of Onset   Cancer Maternal Grandmother     ADVANCED DIRECTIVES (Y/N):  N  HEALTH MAINTENANCE: Social History   Tobacco Use   Smoking status: Former    Current packs/day: 0.00    Types: Cigarettes    Quit date: 12/19/2022    Years since quitting: 0.4   Smokeless tobacco: Never   Tobacco comments:    Quit smoking 12/2022  Vaping Use   Vaping status: Never Used  Substance Use Topics   Alcohol use: Yes    Comment: occ   Drug use: No    Colonoscopy:  PAP:  Bone density:  Lipid panel:  Allergies  Allergen Reactions   Nifedipine Palpitations    Current Outpatient Medications  Medication Sig Dispense Refill   albuterol (PROVENTIL) (2.5 MG/3ML) 0.083% nebulizer solution Take 2.5 mg by nebulization every 6 (six) hours as needed for wheezing or shortness of breath.     atenolol-chlorthalidone (TENORETIC) 50-25 MG tablet Take 1 tablet by mouth daily.     atorvastatin (LIPITOR) 10 MG tablet Take 10 mg by mouth daily.  Buprenorphine HCl-Naloxone HCl 4-1 MG FILM Place 4 mg of opioid under the tongue every 12 (twelve) hours.     cilostazol (PLETAL) 100 MG tablet Take 100 mg by mouth 2 (two) times daily.     cyanocobalamin (VITAMIN B12) 500 MCG tablet Take 500 mcg by mouth daily.     DULoxetine (CYMBALTA) 30 MG capsule Take 30 mg by mouth daily.     gabapentin (NEURONTIN) 300 MG capsule Take 300 mg by mouth 3 (three) times daily.     hydrocortisone  2.5 % cream Apply 1 Application topically 2 (two) times daily.     lidocaine-prilocaine (EMLA) cream Apply to affected area once 30 g 3   megestrol (MEGACE) 40 MG tablet Take 1 tablet (40 mg total) by mouth daily. 30 tablet 2   Multiple Vitamin (MULTIVITAMIN) tablet Take 1 tablet by mouth daily.     omeprazole (PRILOSEC) 20 MG capsule Take 20 mg by mouth daily.     ondansetron (ZOFRAN) 8 MG tablet Take 1 tablet (8 mg total) by mouth every 8 (eight) hours as needed for nausea or vomiting. Start on the third day after chemotherapy. 60 tablet 2   ondansetron (ZOFRAN-ODT) 4 MG disintegrating tablet Take 4 mg by mouth 2 (two) times daily as needed for nausea or vomiting.     polyethylene glycol (MIRALAX / GLYCOLAX) 17 g packet Take 17 g by mouth daily.     prochlorperazine (COMPAZINE) 10 MG tablet Take 1 tablet (10 mg total) by mouth every 6 (six) hours as needed for nausea or vomiting. 60 tablet 2   SENEXON-S 8.6-50 MG tablet Take 1 tablet by mouth 2 (two) times daily.     tiZANidine (ZANAFLEX) 2 MG tablet Take 2 mg by mouth at bedtime.     traMADol (ULTRAM-ER) 100 MG 24 hr tablet Take 100 mg by mouth daily.     Vitamin D, Ergocalciferol, (DRISDOL) 1.25 MG (50000 UNIT) CAPS capsule Take 50,000 Units by mouth every 7 (seven) days.     cetirizine (ZYRTEC ALLERGY) 10 MG tablet Take 1 tablet (10 mg total) by mouth daily. 30 tablet 0   cyclobenzaprine (FLEXERIL) 10 MG tablet Take 10 mg by mouth 3 (three) times daily as needed for muscle spasms. (Patient not taking: Reported on 02/14/2023)     naloxone [facility]) nasal spray 4 mg/0.1 mL Place 1 spray into the nose once. (Patient not taking: Reported on 02/14/2023)     NIFEdipine (ADALAT CC) 30 MG 24 hr tablet Take 30 mg by mouth daily. (Patient not taking: Reported on 02/14/2023)     oxyCODONE-acetaminophen (PERCOCET/ROXICET) 5-325 MG tablet Take 1 tablet by mouth 2 (two) times daily. (Patient not taking: Reported on 04/04/2023)     potassium chloride (KLOR-CON) 10  MEQ tablet Take 10 mEq by mouth daily. (Patient not taking: Reported on 02/14/2023)     No current facility-administered medications for this visit.    OBJECTIVE: Vitals:   06/06/23 0849  BP: 108/74  Pulse: 95  Temp: (!) 97.4 F (36.3 C)  SpO2: 100%      Body mass index is 35.35 kg/m.     ECOG FS:2 - Symptomatic, <50% confined to bed  General: Well-developed, well-nourished, no acute distress. Eyes: Pink conjunctiva, anicteric sclera. Lungs: Clear to auscultation bilaterally.  No audible wheezing or coughing Heart: Regular rate and rhythm.  Abdomen: Soft, nontender, nondistended.  Musculoskeletal: No edema, cyanosis, or clubbing. Neuro: Alert, answering all questions appropriately. Cranial nerves grossly intact. Skin: No rashes or petechiae  noted. Psych: Normal affect.   LAB RESULTS:  Lab Results  Component Value Date   NA 136 05/16/2023   K 3.6 05/16/2023   CL 111 05/16/2023   CO2 19 (L) 05/16/2023   GLUCOSE 136 (H) 05/16/2023   BUN 16 05/16/2023   CREATININE 0.90 05/16/2023   CALCIUM 8.5 (L) 05/16/2023   PROT 6.8 05/16/2023   ALBUMIN 3.5 05/16/2023   AST 31 05/16/2023   ALT 21 05/16/2023   ALKPHOS 426 (H) 05/16/2023   BILITOT 0.5 05/16/2023   GFRNONAA >60 05/16/2023   GFRAA >60 09/27/2018    Lab Results  Component Value Date   WBC 9.7 06/06/2023   NEUTROABS 6.4 06/06/2023   HGB 10.0 (L) 06/06/2023   HCT 30.4 (L) 06/06/2023   MCV 93.0 06/06/2023   PLT 177 06/06/2023   Iron/TIBC/Ferritin/ %Sat    Component Value Date/Time   IRON 93 04/25/2023 0834   TIBC 273 04/25/2023 0834   FERRITIN 500 (H) 04/25/2023 0834   IRONPCTSAT 34 (H) 04/25/2023 0834     STUDIES: No results found.  ONCOLOGY: Patient's workup was initiated at [facility] in February 2024 at which point PET scan revealed widely metastatic disease with innumerable sclerotic bony lesions as well as axillary, mediastinal, and abdominal lymphadenopathy.  Subsequent biopsy of a right  axillary lymph node revealed signet ring adenocarcinoma, likely GI origin.  Additional testing revealed high tumor mutational burden.  Further workup at [facility] in April 2024 revealed a negative EGD, and negative CT scanning of the brain.  CT scan of the abdomen and follow-up pelvic ultrasound revealed a large left adnexal mass that was not commented on PET scan from February.  CA 19-9 is normal, but CA125 and CEA are mildly elevated.    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  Repeat PET scan on February 24, 2023 confirmed widespread metastatic disease.  Left adnexal mass is now PET positive of unclear clinical significance and after evaluation by gyn-onc, ultimately would not change the overall treatment plan.  Patient will receive FOLFOX plus Keytruda every 3 weeks for 12 cycles followed by maintenance Keytruda for at least 2 years.  Patient has had port placed. Labs reviewed and acceptable for treatment. Proceed with cycle 5 today. She will return to clinic in 3 days for pump removal and fulphila/GCSF support. She will then return to clinic in 3 weeks for further evaluation and consideration of cycle 6. TSH normal. Monitor in setting of keytruda.  Adnexal mass: PET scan with incidental mass as above. Saw Dr Berchuck/gyn onc 03/02/23. Hold on biopsy or resection of mass given her above metastatic disease and that hse is asymptomatic. If that changes, could have her reevaluated with gyn onc or possible surgery.  Pain: Followed by pain clinic in [place], [place].  Can consider radiation oncology in the future if necessary. Bony disease: Zometa on even number cycles as above. Continue calcium 1200 mg and vitamin d 1000 units daily. Calcium 8.1. Encouraged her to check dose of oral calcium as we may need to increase.  Transaminitis: Alk phos elevated  likely related to disease and improving. AST and ALT elevations have resolved.  Anemia: Chronic and unchanged.  Patient's hemoglobin is 10.2 today. Symptomatic of IDA. Add ferritin and iron studies today. Iron stores are well replenished. Likely d/t chemotherapy. Monitor.  Neutropenia: Fulphilia with pump removal as above.  Resolved.  Weight loss: Follow-up with dietary as scheduled.  Hypertension: Monitor. Positive hcg- likely related to her tumor vs hertophile antibody. Patient is sexually active, menstruating, and not on birth control. She wishes to proceed with treatments after again discussing potential life threatening and damaging effects to embryo if one exists. She does not wish to evaluate via pelvic ultrasound. Will check quantitative hcg. Megace is unlikely to cause ovarian suppression. Previously in 59s in April, 65 most recently. Today is pending. Recommend birth control including abstinence, condoms, OCPs, Depo-Lupron, vs IUD. Discussed pros and cons of each. She will think about these and let us know how she would like to proceed.  Right leg pain- question if related to right hip bone metastases vs GCSF vs others. Pain managed by pain management in [place]. Will refer to rad onc for evaluation and consideration of palliative radiation.   Disposition:  Treatment today D3 pump off, fulphila 3 weeks- port/labs, Dr Orlie Dakin, +/- Cycle 6 FOLFOX + Keytruda and zometa. D3 Fulphila & pump off - la  Patient expressed understanding and was in agreement with this plan. She also understands that She can call clinic at any time with any questions, concerns, or complaints.    Cancer Staging  Signet ring cell adenocarcinoma [facility]) Staging form: Exocrine Pancreas, AJCC 8th Edition - Clinical stage from 02/07/2023: Stage IV (cTX, cNX, pM1) - Signed by Jeralyn Ruths, MD on 02/14/2023 Stage prefix: Initial diagnosis  Alinda Dooms, NP   06/06/2023   CC: Dr. Orlie Dakin

## 2023-06-06 NOTE — Patient Instructions (Signed)
[facility] [place] [facility]  Discharge Instructions: Thank you for choosing [facility] to provide your oncology and hematology care.  If you have a lab appointment with the Cancer Center, please go directly to the Cancer Center and check in at the registration area.  Wear comfortable clothing and clothing appropriate for easy access to any Portacath or PICC line.   We strive to give you quality time with your provider. You may need to reschedule your appointment if you arrive late (15 or more minutes).  Arriving late affects you and other patients whose appointments are after yours.  Also, if you miss three or more appointments without notifying the office, you may be dismissed from the clinic at the provider's discretion.      For prescription refill requests, have your pharmacy contact our office and allow 72 hours for refills to be completed.     To help prevent nausea and vomiting after your treatment, we encourage you to take your nausea medication as directed.  BELOW ARE SYMPTOMS THAT SHOULD BE REPORTED IMMEDIATELY: *FEVER GREATER THAN 100.4 F (38 C) OR HIGHER *CHILLS OR SWEATING *NAUSEA AND VOMITING THAT IS NOT CONTROLLED WITH YOUR NAUSEA MEDICATION *UNUSUAL SHORTNESS OF BREATH *UNUSUAL BRUISING OR BLEEDING *URINARY PROBLEMS (pain or burning when urinating, or frequent urination) *BOWEL PROBLEMS (unusual diarrhea, constipation, pain near the anus) TENDERNESS IN MOUTH AND THROAT WITH OR WITHOUT PRESENCE OF ULCERS (sore throat, sores in mouth, or a toothache) UNUSUAL RASH, SWELLING OR PAIN  UNUSUAL VAGINAL DISCHARGE OR ITCHING   Items with * indicate a potential emergency and should be followed up as soon as possible or go to the Emergency Department if any problems should occur.  Please show the CHEMOTHERAPY ALERT CARD or IMMUNOTHERAPY ALERT CARD at check-in to the Emergency Department and triage nurse.  Should you have questions after your visit  or need to cancel or reschedule your appointment, please contact [facility] [place] [facility]  [phone] and follow the prompts.  Office hours are 8:00 a.m. to 4:30 p.m. Monday - Friday. Please note that voicemails left after 4:00 p.m. may not be returned until the following business day.  We are closed weekends and major holidays. You have access to a nurse at all times for urgent questions. Please call the main number to the clinic [phone] and follow the prompts.  For any non-urgent questions, you may also contact your provider using MyChart. We now offer e-Visits for anyone 18 and older to request care online for non-urgent symptoms. For details visit [email or web address].   Also download the MyChart app! Go to the app store, search "MyChart", open the app, select [place], and log in with your MyChart username and password.    

## 2023-06-08 ENCOUNTER — Inpatient Hospital Stay: Payer: 59

## 2023-06-08 VITALS — BP 140/90 | HR 95 | Temp 96.9°F | Resp 16

## 2023-06-08 DIAGNOSIS — C801 Malignant (primary) neoplasm, unspecified: Secondary | ICD-10-CM

## 2023-06-08 DIAGNOSIS — Z510 Encounter for antineoplastic radiation therapy: Secondary | ICD-10-CM | POA: Diagnosis not present

## 2023-06-08 MED ORDER — HEPARIN SOD (PORK) LOCK FLUSH 100 UNIT/ML IV SOLN
500.0000 [IU] | Freq: Once | INTRAVENOUS | Status: AC | PRN
  Administered 2023-06-08: 500 [IU]
  Filled 2023-06-08: qty 5

## 2023-06-08 MED ORDER — PEGFILGRASTIM-JMDB 6 MG/0.6ML ~~LOC~~ SOSY
6.0000 mg | PREFILLED_SYRINGE | Freq: Once | SUBCUTANEOUS | Status: AC
  Administered 2023-06-08: 6 mg via SUBCUTANEOUS

## 2023-06-08 MED ORDER — SODIUM CHLORIDE 0.9% FLUSH
10.0000 mL | INTRAVENOUS | Status: DC | PRN
  Administered 2023-06-08: 10 mL
  Filled 2023-06-08: qty 10

## 2023-06-08 NOTE — Progress Notes (Signed)
Nutrition Follow-up:  Patient with metastatic signet ring adenocarcinoma, likely GI origin.  Patient receiving folfox and keytruda.    Met with patient at pump removal.  Patient tearful and reports that she is having issues with pain.  Says that she is eating well and drinking ensure.  Eating ice cream > 7 days after treatment.  Able to eat 2 boiled eggs this am with sausage and drink ensure shake.    Medications: reviewed  Labs: reviewed  Anthropometrics:   Weight 181 lb on 8/5 181 lb on 6/24 178 lb on 6/3 182 lb on 5/20 178 lb on 4/30   NUTRITION DIAGNOSIS: Unintentional weight loss stable    INTERVENTION:  Continue high calorie, high protein foods to help maintain weight Coupons given for ensure shakes.      MONITORING, EVALUATION, GOAL: weight trends, intake   NEXT VISIT: as needed   B. Freida Busman, RD, LDN Registered Dietitian [phone]

## 2023-06-09 ENCOUNTER — Ambulatory Visit: Payer: 59 | Admitting: Radiation Oncology

## 2023-06-13 ENCOUNTER — Inpatient Hospital Stay: Payer: 59

## 2023-06-13 ENCOUNTER — Telehealth: Payer: Self-pay

## 2023-06-13 ENCOUNTER — Encounter: Payer: Self-pay | Admitting: Hospice and Palliative Medicine

## 2023-06-13 ENCOUNTER — Other Ambulatory Visit: Payer: Self-pay

## 2023-06-13 ENCOUNTER — Inpatient Hospital Stay (HOSPITAL_BASED_OUTPATIENT_CLINIC_OR_DEPARTMENT_OTHER): Payer: 59 | Admitting: Hospice and Palliative Medicine

## 2023-06-13 VITALS — BP 173/104 | HR 91 | Temp 96.8°F | Resp 18

## 2023-06-13 DIAGNOSIS — R197 Diarrhea, unspecified: Secondary | ICD-10-CM

## 2023-06-13 DIAGNOSIS — C801 Malignant (primary) neoplasm, unspecified: Secondary | ICD-10-CM | POA: Diagnosis not present

## 2023-06-13 DIAGNOSIS — E860 Dehydration: Secondary | ICD-10-CM | POA: Diagnosis not present

## 2023-06-13 DIAGNOSIS — Z510 Encounter for antineoplastic radiation therapy: Secondary | ICD-10-CM | POA: Diagnosis not present

## 2023-06-13 LAB — CBC WITH DIFFERENTIAL (CANCER CENTER ONLY)
Abs Immature Granulocytes: 0.14 10*3/uL — ABNORMAL HIGH (ref 0.00–0.07)
Basophils Absolute: 0.1 10*3/uL (ref 0.0–0.1)
Basophils Relative: 1 %
Eosinophils Absolute: 0.2 10*3/uL (ref 0.0–0.5)
Eosinophils Relative: 2 %
HCT: 30.7 % — ABNORMAL LOW (ref 36.0–46.0)
Hemoglobin: 10.3 g/dL — ABNORMAL LOW (ref 12.0–15.0)
Immature Granulocytes: 2 %
Lymphocytes Relative: 16 %
Lymphs Abs: 1.4 10*3/uL (ref 0.7–4.0)
MCH: 30.7 pg (ref 26.0–34.0)
MCHC: 33.6 g/dL (ref 30.0–36.0)
MCV: 91.6 fL (ref 80.0–100.0)
Monocytes Absolute: 0.7 10*3/uL (ref 0.1–1.0)
Monocytes Relative: 8 %
Neutro Abs: 6.1 10*3/uL (ref 1.7–7.7)
Neutrophils Relative %: 71 %
Platelet Count: 128 10*3/uL — ABNORMAL LOW (ref 150–400)
RBC: 3.35 MIL/uL — ABNORMAL LOW (ref 3.87–5.11)
RDW: 16.3 % — ABNORMAL HIGH (ref 11.5–15.5)
WBC Count: 8.6 10*3/uL (ref 4.0–10.5)
nRBC: 0.2 % (ref 0.0–0.2)

## 2023-06-13 LAB — MAGNESIUM: Magnesium: 2.1 mg/dL (ref 1.7–2.4)

## 2023-06-13 LAB — CMP (CANCER CENTER ONLY)
ALT: 24 U/L (ref 0–44)
AST: 32 U/L (ref 15–41)
Albumin: 4 g/dL (ref 3.5–5.0)
Alkaline Phosphatase: 371 U/L — ABNORMAL HIGH (ref 38–126)
Anion gap: 9 (ref 5–15)
BUN: 14 mg/dL (ref 6–20)
CO2: 19 mmol/L — ABNORMAL LOW (ref 22–32)
Calcium: 9.4 mg/dL (ref 8.9–10.3)
Chloride: 103 mmol/L (ref 98–111)
Creatinine: 0.66 mg/dL (ref 0.44–1.00)
GFR, Estimated: >60 mL/min (ref >60)
Glucose, Bld: 102 mg/dL — ABNORMAL HIGH (ref 70–99)
Potassium: 4.3 mmol/L (ref 3.5–5.1)
Sodium: 131 mmol/L — ABNORMAL LOW (ref 135–145)
Total Bilirubin: 1 mg/dL (ref 0.3–1.2)
Total Protein: 8 g/dL (ref 6.5–8.1)

## 2023-06-13 MED ORDER — SODIUM CHLORIDE 0.9 % IV SOLN
INTRAVENOUS | Status: DC
  Filled 2023-06-13 (×2): qty 250

## 2023-06-13 MED ORDER — SODIUM CHLORIDE 0.9% FLUSH
10.0000 mL | Freq: Once | INTRAVENOUS | Status: AC
  Administered 2023-06-13: 10 mL via INTRAVENOUS
  Filled 2023-06-13: qty 10

## 2023-06-13 MED ORDER — HEPARIN SOD (PORK) LOCK FLUSH 100 UNIT/ML IV SOLN
500.0000 [IU] | Freq: Once | INTRAVENOUS | Status: AC
  Administered 2023-06-13: 500 [IU] via INTRAVENOUS
  Filled 2023-06-13: qty 5

## 2023-06-13 NOTE — Progress Notes (Signed)
Symptom Management Clinic [facility] [phone] Fax:(336) [phone]  Patient Care Team: [name], MD as PCP - General (Internal Medicine) Jim Like, RN as Registered Nurse Scarlett Presto, RN (Inactive) as Registered Nurse Benita Gutter, RN as Oncology Nurse Navigator Jeralyn Ruths, MD as Consulting Physician (Oncology)   NAME OF PATIENT: Sheryl Silva  [id number]  01/02/1976   DATE OF VISIT: 06/13/23  REASON FOR CONSULT: Sheryl Silva is a 47 y.o. 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   INTERVAL HISTORY: Patient received day 1, cycle 5 FOLFOX plus Keytruda on 06/06/2023.  She presents [facility] today with complaint that since receiving chemotherapy, she has had intermittent loose stools and nausea.  She denies vomiting.  Reports poor oral intake.  Denies fever or chills.  No abdominal pain.  Denies GU symptoms. Patient offers no further specific complaints today.  PAST MEDICAL HISTORY: Past Medical History:  Diagnosis Date   Anxiety    Cancer (HCC)    Depression    Heart murmur    Hypertension     PAST SURGICAL HISTORY:  Past Surgical History:  Procedure Laterality Date   CESAREAN SECTION     COLONOSCOPY WITH PROPOFOL N/A 02/21/2023   Procedure: COLONOSCOPY WITH PROPOFOL;  Surgeon: Regis Bill, MD;  Location: [facility] ENDOSCOPY;  Service: Endoscopy;  Laterality: N/A;   DILATION AND CURETTAGE OF UTERUS     IR IMAGING GUIDED PORT INSERTION  02/16/2023    HEMATOLOGY/ONCOLOGY HISTORY:  Oncology History  Signet ring cell adenocarcinoma (HCC)  02/07/2023 Cancer Staging   Staging form: Exocrine Pancreas, AJCC 8th Edition - Clinical stage from 02/07/2023: Stage IV (cTX, cNX, pM1) - Signed by Jeralyn Ruths, MD on 02/14/2023 Stage prefix: Initial diagnosis   02/14/2023 Initial Diagnosis   Signet ring cell adenocarcinoma    03/02/2023 -  Chemotherapy   Patient is on Treatment Plan : GI ORIGIN FOLFOX+KEYTRUDA q21d x12 followed by Gwenlyn Fudge       ALLERGIES:  is allergic to nifedipine.  MEDICATIONS:  Current Outpatient Medications  Medication Sig Dispense Refill   cyanocobalamin (VITAMIN B12) 500 MCG tablet Take 500 mcg by mouth daily.     DULoxetine (CYMBALTA) 30 MG capsule Take 30 mg by mouth daily.     gabapentin (NEURONTIN) 300 MG capsule Take 300 mg by mouth 3 (three) times daily.     hydrocortisone 2.5 % cream Apply 1 Application topically 2 (two) times daily.     lidocaine-prilocaine (EMLA) cream Apply to affected area once 30 g 3   megestrol (MEGACE) 40 MG tablet Take 1 tablet (40 mg total) by mouth daily. 30 tablet 2   Multiple Vitamin (MULTIVITAMIN) tablet Take 1 tablet by mouth daily.     omeprazole (PRILOSEC) 20 MG capsule Take 20 mg by mouth daily.     ondansetron (ZOFRAN) 8 MG tablet Take 1 tablet (8 mg total) by mouth every 8 (eight) hours as needed for nausea or vomiting. Start on the third day after chemotherapy. 60 tablet 2   prochlorperazine (COMPAZINE) 10 MG tablet Take 1 tablet (10 mg total) by mouth every 6 (six) hours as needed for nausea or vomiting. 60 tablet 2   tiZANidine (ZANAFLEX) 2 MG tablet Take 2 mg by mouth at bedtime.     traMADol (ULTRAM-ER) 100 MG 24 hr tablet Take 100 mg by mouth daily.     Vitamin D, Ergocalciferol, (DRISDOL) 1.25 MG (  50000 UNIT) CAPS capsule Take 50,000 Units by mouth every 7 (seven) days.     albuterol (PROVENTIL) (2.5 MG/3ML) 0.083% nebulizer solution Take 2.5 mg by nebulization every 6 (six) hours as needed for wheezing or shortness of breath. (Patient not taking: Reported on 06/13/2023)     atenolol-chlorthalidone (TENORETIC) 50-25 MG tablet Take 1 tablet by mouth daily. (Patient not taking: Reported on 06/13/2023)     Buprenorphine HCl-Naloxone HCl 4-1 MG FILM Place 4 mg of opioid under the tongue every 12 (twelve) hours. (Patient not taking:  Reported on 06/13/2023)     cetirizine (ZYRTEC ALLERGY) 10 MG tablet Take 1 tablet (10 mg total) by mouth daily. (Patient not taking: Reported on 06/13/2023) 30 tablet 0   cilostazol (PLETAL) 100 MG tablet Take 100 mg by mouth 2 (two) times daily.     naloxone (NARCAN) nasal spray 4 mg/0.1 mL Place 1 spray into the nose once. (Patient not taking: Reported on 02/14/2023)     ondansetron (ZOFRAN-ODT) 4 MG disintegrating tablet Take 4 mg by mouth 2 (two) times daily as needed for nausea or vomiting. (Patient not taking: Reported on 06/13/2023)     oxyCODONE-acetaminophen (PERCOCET/ROXICET) 5-325 MG tablet Take 1 tablet by mouth 2 (two) times daily. (Patient not taking: Reported on 04/04/2023)     polyethylene glycol (MIRALAX / GLYCOLAX) 17 g packet Take 17 g by mouth daily. (Patient not taking: Reported on 06/13/2023)     SENEXON-S 8.6-50 MG tablet Take 1 tablet by mouth 2 (two) times daily. (Patient not taking: Reported on 06/13/2023)     No current facility-administered medications for this visit.   Facility-Administered Medications Ordered in Other Visits  Medication Dose Route Frequency Provider Last Rate Last Admin   0.9 %  sodium chloride infusion   Intravenous Continuous , Daryl Eastern, NP 999 mL/hr at 06/13/23 1303 New Bag at 06/13/23 1303   heparin lock flush 100 unit/mL  500 Units Intravenous Once , Daryl Eastern, NP        VITAL SIGNS: BP (!) 173/104   Pulse 91   Temp (!) 96.8 F (36 C) (Tympanic)   Resp 18  There were no vitals filed for this visit.  Estimated body mass index is 35.35 kg/m as calculated from the following:   Height as of 04/04/23: 5' (1.524 m).   Weight as of 06/06/23: 181 lb (82.1 kg).  LABS: CBC:    Component Value Date/Time   WBC 8.6 06/13/2023 1253   WBC 7.2 09/27/2018 2052   HGB 10.3 (L) 06/13/2023 1253   HCT 30.7 (L) 06/13/2023 1253   PLT 128 (L) 06/13/2023 1253   MCV 91.6 06/13/2023 1253   NEUTROABS 6.1 06/13/2023 1253   LYMPHSABS 1.4 06/13/2023 1253    MONOABS 0.7 06/13/2023 1253   EOSABS 0.2 06/13/2023 1253   BASOSABS 0.1 06/13/2023 1253   Comprehensive Metabolic Panel:    Component Value Date/Time   NA 131 (L) 06/13/2023 1253   K 4.3 06/13/2023 1253   CL 103 06/13/2023 1253   CO2 19 (L) 06/13/2023 1253   BUN 14 06/13/2023 1253   CREATININE 0.66 06/13/2023 1253   GLUCOSE 102 (H) 06/13/2023 1253   CALCIUM 9.4 06/13/2023 1253   AST 32 06/13/2023 1253   ALT 24 06/13/2023 1253   ALKPHOS 371 (H) 06/13/2023 1253   BILITOT 1.0 06/13/2023 1253   PROT 8.0 06/13/2023 1253   ALBUMIN 4.0 06/13/2023 1253    RADIOGRAPHIC STUDIES: No results found.  PERFORMANCE STATUS (ECOG) : 1 -  Symptomatic but completely ambulatory  Review of Systems Unless otherwise noted, a complete review of systems is negative.  Physical Exam General: NAD Cardiovascular: regular rate and rhythm Pulmonary: clear ant fields Abdomen: soft, nontender, + bowel sounds GU: no suprapubic tenderness Extremities: no edema, no joint deformities Skin: no rashes Neurological: Weakness but otherwise nonfocal  IMPRESSION/PLAN: metastatic signet ring adenocarcinoma of likely GI origin -on FOLFOX plus Keytruda  Nausea/diarrhea -recommend supportive care with antiemetics/antidiarrheals.  Most likely, the symptoms stem from her chemotherapy.  Dehydration -proceed with IV fluids today.  Labs/fluids next week if needed   Patient expressed understanding and was in agreement with this plan. She also understands that She can call clinic at any time with any questions, concerns, or complaints.   Thank you for allowing me to participate in the care of this very pleasant patient.   Time Total: 15 minutes  Visit consisted of counseling and education dealing with the complex and emotionally intense issues of symptom management in the setting of serious illness.Greater than 50%  of this time was spent counseling and coordinating care related to the above assessment and  plan.  Signed by: Laurette Schimke, PhD, NP-C

## 2023-06-13 NOTE — Progress Notes (Signed)
Patient instructed to contact Dr. Marcello Fennel for abnormal blood pressures readings. Pt has not been taking her atenolol chlorthalidone.

## 2023-06-13 NOTE — Telephone Encounter (Signed)
Patient states that she was told to call if she felt like she needs fluids. She thinks that she does. She has been having diarrhea, nausea, and her lips are very dry no matter how much water she drinks. She says that she overall just doesn't feel well. Call back # [phone].

## 2023-06-13 NOTE — Telephone Encounter (Signed)
Spoke with Sharia Reeve- ok to bring patient in this am for port labs smc and iv fluids. I called pt and asked if she could come this morning. She can not and would prefer to come first patient this afternoon. I asked pt to come at 1245 for port labs/ followed by smc and fluid apt.

## 2023-06-15 ENCOUNTER — Ambulatory Visit
Admission: RE | Admit: 2023-06-15 | Discharge: 2023-06-15 | Disposition: A | Discharge status: Home / Self Care | Payer: 59 | Source: Ambulatory Visit | Attending: Radiation Oncology | Admitting: Radiation Oncology

## 2023-06-15 ENCOUNTER — Encounter: Payer: Self-pay | Admitting: Radiation Oncology

## 2023-06-15 VITALS — BP 173/109 | HR 86 | Temp 98.2°F | Resp 16 | Wt 181.0 lb

## 2023-06-15 DIAGNOSIS — Z510 Encounter for antineoplastic radiation therapy: Secondary | ICD-10-CM | POA: Diagnosis not present

## 2023-06-15 DIAGNOSIS — C801 Malignant (primary) neoplasm, unspecified: Secondary | ICD-10-CM

## 2023-06-15 NOTE — Consult Note (Signed)
NEW PATIENT EVALUATION  Name: Sheryl Silva  MRN: [id number]  Date:   06/15/2023     DOB: [birth year]   This 47 y.o. 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  She is currently undergoing FOLFOX plus Keytruda therapy.  She is having PET CT scan shows diffuse hypermetabolic skeletal metastasis.  She also has hypermetabolic cervical axillary mediastinal abdominal and retroperitoneal involved adenopathy.  She is enlarged left axilla and adnexal lesion with low-level hypermetabolic activity.  She is complaining of low back pain and pain in her bilateral hips.  All of these areas are involved by metastatic disease by PET/CT criteria.  She is having focal no focal neurologic deficits.  She is ambulating fair although painful.  She is seen today for consideration of palliative radiation.  PLANNED TREATMENT REGIMEN: Palliative radiation therapy to her pelvis and bilateral hips  PAST MEDICAL HISTORY:  has a past medical history of Anxiety, Cancer (HCC), Depression, Heart murmur, and Hypertension.    PAST SURGICAL HISTORY:  Past Surgical History:  Procedure Laterality Date   CESAREAN SECTION     COLONOSCOPY WITH PROPOFOL N/A 02/21/2023   Procedure: COLONOSCOPY WITH PROPOFOL;  Surgeon: Regis Bill, MD;  Location: [facility] ENDOSCOPY;  Service: Endoscopy;  Laterality: N/A;   DILATION AND CURETTAGE OF UTERUS     IR IMAGING GUIDED PORT INSERTION  02/16/2023    FAMILY  HISTORY: family history includes Cancer in her maternal grandmother.  SOCIAL HISTORY:  reports that she quit smoking about 5 months ago. Her smoking use included cigarettes. She has never used smokeless tobacco. She reports current alcohol use. She reports that she does not use drugs.  ALLERGIES: Nifedipine  MEDICATIONS:  Current Outpatient Medications  Medication Sig Dispense Refill   cilostazol (PLETAL) 100 MG tablet Take 100 mg by mouth 2 (two) times daily.     cyanocobalamin (VITAMIN B12) 500 MCG tablet Take 500 mcg by mouth daily.     DULoxetine (CYMBALTA) 30 MG capsule Take 30 mg by mouth daily.     gabapentin (NEURONTIN) 300 MG capsule Take 300 mg by mouth 3 (three) times daily.     hydrocortisone 2.5 % cream Apply 1 Application topically 2 (two) times daily.     lidocaine-prilocaine (EMLA) cream Apply to affected area once 30 g 3   megestrol (MEGACE) 40 MG tablet Take 1 tablet (40 mg total) by mouth daily. 30 tablet 2   Multiple Vitamin (MULTIVITAMIN) tablet Take 1 tablet by mouth daily.     naloxone (NARCAN) nasal spray 4 mg/0.1 mL Place 1 spray into the nose once.     omeprazole (PRILOSEC) 20 MG capsule Take 20 mg by mouth daily.     ondansetron (ZOFRAN) 8 MG tablet Take 1 tablet (8 mg total) by mouth every 8 (eight) hours as needed for nausea or vomiting. Start on the third day after chemotherapy. 60 tablet 2   prochlorperazine (COMPAZINE) 10 MG tablet Take 1 tablet (10 mg total) by mouth every 6 (six) hours as needed  for nausea or vomiting. 60 tablet 2   tiZANidine (ZANAFLEX) 2 MG tablet Take 2 mg by mouth at bedtime.     traMADol (ULTRAM-ER) 100 MG 24 hr tablet Take 100 mg by mouth daily.     Vitamin D, Ergocalciferol, (DRISDOL) 1.25 MG (50000 UNIT) CAPS capsule Take 50,000 Units by mouth every 7 (seven) days.     albuterol (PROVENTIL) (2.5 MG/3ML) 0.083% nebulizer solution Take 2.5 mg by nebulization every 6 (six) hours as needed for wheezing or shortness of breath. (Patient not  taking: Reported on 06/13/2023)     atenolol-chlorthalidone (TENORETIC) 50-25 MG tablet Take 1 tablet by mouth daily. (Patient not taking: Reported on 06/13/2023)     Buprenorphine HCl-Naloxone HCl 4-1 MG FILM Place 4 mg of opioid under the tongue every 12 (twelve) hours. (Patient not taking: Reported on 06/13/2023)     cetirizine (ZYRTEC ALLERGY) 10 MG tablet Take 1 tablet (10 mg total) by mouth daily. (Patient not taking: Reported on 06/13/2023) 30 tablet 0   ondansetron (ZOFRAN-ODT) 4 MG disintegrating tablet Take 4 mg by mouth 2 (two) times daily as needed for nausea or vomiting. (Patient not taking: Reported on 06/13/2023)     oxyCODONE-acetaminophen (PERCOCET/ROXICET) 5-325 MG tablet Take 1 tablet by mouth 2 (two) times daily. (Patient not taking: Reported on 04/04/2023)     polyethylene glycol (MIRALAX / GLYCOLAX) 17 g packet Take 17 g by mouth daily. (Patient not taking: Reported on 06/13/2023)     SENEXON-S 8.6-50 MG tablet Take 1 tablet by mouth 2 (two) times daily. (Patient not taking: Reported on 06/13/2023)     No current facility-administered medications for this encounter.    ECOG PERFORMANCE STATUS:  1 - Symptomatic but completely ambulatory  REVIEW OF SYSTEMS: Patient denies any weight loss, fatigue, weakness, fever, chills or night sweats. Patient denies any loss of vision, blurred vision. Patient denies any ringing  of the ears or hearing loss. No irregular heartbeat. Patient denies heart murmur or history of fainting. Patient denies any chest pain or pain radiating to her upper extremities. Patient denies any shortness of breath, difficulty breathing at night, cough or hemoptysis. Patient denies any swelling in the lower legs. Patient denies any nausea vomiting, vomiting of blood, or coffee ground material in the vomitus. Patient denies any stomach pain. Patient states has had normal bowel movements no significant constipation or diarrhea. Patient denies any dysuria, hematuria or  significant nocturia. Patient denies any problems walking, swelling in the joints or loss of balance. Patient denies any skin changes, loss of hair or loss of weight. Patient denies any excessive worrying or anxiety or significant depression. Patient denies any problems with insomnia. Patient denies excessive thirst, polyuria, polydipsia. Patient denies any swollen glands, patient denies easy bruising or easy bleeding. Patient denies any recent infections, allergies or URI. Patient "s visual fields have not changed significantly in recent time.   PHYSICAL EXAM: BP (!) 173/109 Comment: Patient going to PCP to check on BP medications  Pulse 86   Temp 98.2 F (36.8 C) (Tympanic)   Resp 16   Wt 181 lb (82.1 kg) Comment: stated wt  BMI 35.35 kg/m  Range of motion of the lower extremities does not elicit pain motor and sensory levels are equal and symmetric in the lower extremities proprioception is intact.  Deep palpation of her lower spine does not elicit pain.  Well-developed well-nourished patient in NAD. HEENT reveals PERLA, EOMI, discs not visualized.  Oral cavity is clear. No oral mucosal  lesions are identified. Neck is clear without evidence of cervical or supraclavicular adenopathy. Lungs are clear to A&P. Cardiac examination is essentially unremarkable with regular rate and rhythm without murmur rub or thrill. Abdomen is benign with no organomegaly or masses noted. Motor sensory and DTR levels are equal and symmetric in the upper and lower extremities. Cranial nerves II through XII are grossly intact. Proprioception is intact. No peripheral adenopathy or edema is identified. No motor or sensory levels are noted. Crude visual fields are within normal range.  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  In  47 year old female  PLAN: At this time all tried on Compass is much area of metastatic disease in her pelvis and bilateral hips since it seems to be the source of her pain.  Would plan on delivering 30 Gray in 10 fractions.  Risks and benefits of treatment including possible diarrhea fatigue alteration blood counts possible increased lower urinary tract symptoms and skin reaction all were discussed with the patient and her family.  I have personally 7 ordered CT simulation for early next week.  Patient comprehends my recommendations well.  I would like to take this opportunity to thank you for allowing me to participate in the care of your patient.Carmina Miller, MD

## 2023-06-16 ENCOUNTER — Other Ambulatory Visit: Payer: Self-pay

## 2023-06-16 ENCOUNTER — Encounter: Payer: Self-pay | Admitting: Emergency Medicine

## 2023-06-16 ENCOUNTER — Emergency Department
Admission: EM | Admit: 2023-06-16 | Discharge: 2023-06-16 | Disposition: A | Discharge status: Home / Self Care | Payer: 59 | Attending: Emergency Medicine | Admitting: Emergency Medicine

## 2023-06-16 DIAGNOSIS — I10 Essential (primary) hypertension: Secondary | ICD-10-CM | POA: Diagnosis not present

## 2023-06-16 DIAGNOSIS — I160 Hypertensive urgency: Secondary | ICD-10-CM | POA: Diagnosis not present

## 2023-06-16 DIAGNOSIS — Z1152 Encounter for screening for COVID-19: Secondary | ICD-10-CM | POA: Insufficient documentation

## 2023-06-16 DIAGNOSIS — R030 Elevated blood-pressure reading, without diagnosis of hypertension: Secondary | ICD-10-CM | POA: Diagnosis present

## 2023-06-16 LAB — COMPREHENSIVE METABOLIC PANEL
ALT: 29 U/L (ref 0–44)
AST: 34 U/L (ref 15–41)
Albumin: 4.4 g/dL (ref 3.5–5.0)
Alkaline Phosphatase: 427 U/L — ABNORMAL HIGH (ref 38–126)
Anion gap: 8 (ref 5–15)
BUN: 10 mg/dL (ref 6–20)
CO2: 17 mmol/L — ABNORMAL LOW (ref 22–32)
Calcium: 8.9 mg/dL (ref 8.9–10.3)
Chloride: 106 mmol/L (ref 98–111)
Creatinine, Ser: 0.69 mg/dL (ref 0.44–1.00)
GFR, Estimated: >60 mL/min (ref >60)
Glucose, Bld: 110 mg/dL — ABNORMAL HIGH (ref 70–99)
Potassium: 4.1 mmol/L (ref 3.5–5.1)
Sodium: 131 mmol/L — ABNORMAL LOW (ref 135–145)
Total Bilirubin: 0.7 mg/dL (ref 0.3–1.2)
Total Protein: 8.2 g/dL — ABNORMAL HIGH (ref 6.5–8.1)

## 2023-06-16 LAB — CBC WITH DIFFERENTIAL/PLATELET
Abs Immature Granulocytes: 0.49 10*3/uL — ABNORMAL HIGH (ref 0.00–0.07)
Basophils Absolute: 0.1 10*3/uL (ref 0.0–0.1)
Basophils Relative: 1 %
Eosinophils Absolute: 0.2 10*3/uL (ref 0.0–0.5)
Eosinophils Relative: 2 %
HCT: 32.8 % — ABNORMAL LOW (ref 36.0–46.0)
Hemoglobin: 11.2 g/dL — ABNORMAL LOW (ref 12.0–15.0)
Immature Granulocytes: 5 %
Lymphocytes Relative: 17 %
Lymphs Abs: 1.8 10*3/uL (ref 0.7–4.0)
MCH: 31.1 pg (ref 26.0–34.0)
MCHC: 34.1 g/dL (ref 30.0–36.0)
MCV: 91.1 fL (ref 80.0–100.0)
Monocytes Absolute: 1.8 10*3/uL — ABNORMAL HIGH (ref 0.1–1.0)
Monocytes Relative: 17 %
Neutro Abs: 6.5 10*3/uL (ref 1.7–7.7)
Neutrophils Relative %: 58 %
Platelets: 141 10*3/uL — ABNORMAL LOW (ref 150–400)
RBC: 3.6 MIL/uL — ABNORMAL LOW (ref 3.87–5.11)
RDW: 16.6 % — ABNORMAL HIGH (ref 11.5–15.5)
WBC: 10.9 10*3/uL — ABNORMAL HIGH (ref 4.0–10.5)
nRBC: 1 % — ABNORMAL HIGH (ref 0.0–0.2)

## 2023-06-16 LAB — SARS CORONAVIRUS 2 BY RT PCR: SARS Coronavirus 2 by RT PCR: NEGATIVE

## 2023-06-16 MED ORDER — CLONIDINE HCL 0.1 MG PO TABS
0.1000 mg | ORAL_TABLET | Freq: Once | ORAL | Status: DC
  Filled 2023-06-16: qty 1

## 2023-06-16 MED ORDER — CLONIDINE HCL 0.1 MG PO TABS: 0.1000 mg | ORAL_TABLET | Freq: Every day | ORAL | 0 refills | Status: DC | PRN

## 2023-06-16 NOTE — ED Provider Notes (Signed)
[facility] Provider Note  Patient Contact: 5:17 PM (approximate)   History   Hypertension   HPI  Sheryl Silva is a 47 y.o. female who presents the emergency department for concerns of hypertension.  Patient states that she is undergoing chemo and scheduled to start radiation treatments for colonic cancer.  Patient states that over the last several days her blood pressure has been running elevated into the 160s and 170s.  Patient states that she used to be on antihypertensives, was able to finally come off of antihypertensives as her pressures have been normalized.  She has been very stressed out about this new plan for radiation over the last 4 days.  She is also felt off, felt achy, some headaches.  There is been no fevers, chills, congestion, sore throat, cough, urinary changes, GI pain.  She has ongoing nausea and vomiting and diarrhea from the chemo but no changes with this.  No changes in frequency or hematic emesis or hematochezia.     Physical Exam   Triage Vital Signs: ED Triage Vitals  Encounter Vitals Group     BP 06/16/23 1326 (!) 186/103     Systolic BP Percentile --      Diastolic BP Percentile --      Pulse Rate 06/16/23 1326 86     Resp 06/16/23 1326 16     Temp 06/16/23 1326 99.5 F (37.5 C)     Temp Source 06/16/23 1621 Oral     SpO2 06/16/23 1326 100 %     Weight 06/16/23 1620 181 lb (82.1 kg)     Height 06/16/23 1620 5' (1.524 m)     Head Circumference --      Peak Flow --      Pain Score 06/16/23 1341 9     Pain Loc --      Pain Education --      Exclude from Growth Chart --     Most recent vital signs: Vitals:   06/16/23 1621 06/16/23 1644  BP: (!) 167/79 (!) 160/95  Pulse: 92 89  Resp: 16 14  Temp: 98.6 F (37 C)   SpO2: 100% 100%     General: Alert and in no acute distress. ENT:      Ears:       Nose: No congestion/rhinnorhea.      Mouth/Throat: Mucous membranes are moist. Neck: No stridor. No cervical spine  tenderness to palpation.  Cardiovascular:  Good peripheral perfusion Respiratory: Normal respiratory effort without tachypnea or retractions. Lungs CTAB. Good air entry to the bases with no decreased or absent breath sounds. Gastrointestinal: Bowel sounds 4 quadrants. Soft and nontender to palpation. No guarding or rigidity. No palpable masses. No distention. No CVA tenderness. Musculoskeletal: Full range of motion to all extremities.  Neurologic:  No gross focal neurologic deficits are appreciated.  Skin:   No rash noted Other:   ED Results / Procedures / Treatments   Labs (all labs ordered are listed, but only abnormal results are displayed) Labs Reviewed  CBC WITH DIFFERENTIAL/PLATELET - Abnormal; Notable for the following components:      Result Value   WBC 10.9 (*)    RBC 3.60 (*)    Hemoglobin 11.2 (*)    HCT 32.8 (*)    RDW 16.6 (*)    Platelets 141 (*)    nRBC 1.0 (*)    Monocytes Absolute 1.8 (*)    Abs Immature Granulocytes 0.49 (*)    All other  components within normal limits  COMPREHENSIVE METABOLIC PANEL - Abnormal; Notable for the following components:   Sodium 131 (*)    CO2 17 (*)    Glucose, Bld 110 (*)    Total Protein 8.2 (*)    Alkaline Phosphatase 427 (*)    All other components within normal limits  SARS CORONAVIRUS 2 BY RT PCR     EKG     RADIOLOGY    No results found.  PROCEDURES:  Critical Care performed: No  Procedures   MEDICATIONS ORDERED IN ED: Medications  cloNIDine (CATAPRES) tablet 0.1 mg (0.1 mg Oral Not Given 06/16/23 1731)     IMPRESSION / MDM / ASSESSMENT AND PLAN / ED COURSE  I reviewed the triage vital signs and the nursing notes.                                 Differential diagnosis includes, but is not limited to, hypertension, hypertensive urgency, hypertensive emergency, viral illness, dehydration, endorgan damage, chronic kidney disease   Patient's presentation is most consistent with acute  presentation with potential threat to life or bodily function.   Patient's diagnosis is consistent with hypertension.  Patient presents emergency department with elevated blood pressure readings at home.  She has been running in the 170s at home, typically she is normotensive without medication.  Patient is currently undergoing chemo, states that she has been told that she needs to start radiation and has been very stressed out for the past several days.  She believes that this is the cause of her hypertension, but also states that she has had some just generalized malaise.  This is not atypical with her chemo.  She also has nausea vomiting and diarrhea with no changes from the chemo.  Labs are reassuring.  Patient pressure was improving in the emergency department without medication.  I suspect that this was in large part secondary to both physical stress of the body undergoing chemo as well as mental stress knowing that she is now starting another treatment therapy.  I recommended checking her blood pressures sporadically throughout the day over the next 3 weeks to trend her blood pressure.  She has appointments with both her oncology and primary care team within the next several days.  Concerning signs and symptoms and return precautions discussed with the patient.  If patient starts having elevated pressures running around 200 systolic for periods of time I do recommend using clonidine.  Small prescription of this as a rescue medicine as prescribed though I did advise the patient that if she has to use this for several days that she should be reevaluated even if she has no other symptoms.  Patient is agreeable with this plan.  She will follow-up with oncology and primary care..  Patient is given ED precautions to return to the ED for any worsening or new symptoms.     FINAL CLINICAL IMPRESSION(S) / ED DIAGNOSES   Final diagnoses:  Primary hypertension     Rx / DC Orders   ED Discharge Orders           Ordered    cloNIDine (CATAPRES) 0.1 MG tablet  Daily PRN        06/16/23 1731             Note:  This document was prepared using Dragon voice recognition software and may include unintentional dictation errors.   , Delorise Royals, PA-C  06/16/23 1754    Chesley Noon, MD 06/16/23 1920

## 2023-06-16 NOTE — ED Triage Notes (Signed)
Pt c/o hypertension x4 days. Pt says she has been off of her BP medication since Feb 2024. Pt endorses HA and feeling generally weak and fatigued. Pt took recently prescribed amlodipine yesterday and today with no improvement in BP. Pt undergoing chemo/radiation for cancer.

## 2023-06-17 ENCOUNTER — Other Ambulatory Visit: Payer: Self-pay | Admitting: *Deleted

## 2023-06-17 ENCOUNTER — Encounter: Payer: Self-pay | Admitting: Oncology

## 2023-06-17 DIAGNOSIS — E860 Dehydration: Secondary | ICD-10-CM

## 2023-06-17 DIAGNOSIS — C801 Malignant (primary) neoplasm, unspecified: Secondary | ICD-10-CM

## 2023-06-18 ENCOUNTER — Other Ambulatory Visit: Payer: Self-pay

## 2023-06-20 ENCOUNTER — Inpatient Hospital Stay: Payer: 59

## 2023-06-20 ENCOUNTER — Other Ambulatory Visit: Payer: Self-pay | Admitting: *Deleted

## 2023-06-20 ENCOUNTER — Ambulatory Visit
Admission: RE | Admit: 2023-06-20 | Discharge: 2023-06-20 | Disposition: A | Discharge status: Home / Self Care | Payer: 59 | Source: Ambulatory Visit | Attending: Radiation Oncology | Admitting: Radiation Oncology

## 2023-06-20 VITALS — BP 155/92 | HR 118 | Temp 97.0°F | Resp 18

## 2023-06-20 DIAGNOSIS — E860 Dehydration: Secondary | ICD-10-CM

## 2023-06-20 DIAGNOSIS — Z510 Encounter for antineoplastic radiation therapy: Secondary | ICD-10-CM | POA: Diagnosis not present

## 2023-06-20 DIAGNOSIS — Z95828 Presence of other vascular implants and grafts: Secondary | ICD-10-CM

## 2023-06-20 DIAGNOSIS — C801 Malignant (primary) neoplasm, unspecified: Secondary | ICD-10-CM

## 2023-06-20 DIAGNOSIS — E871 Hypo-osmolality and hyponatremia: Secondary | ICD-10-CM

## 2023-06-20 LAB — CBC WITH DIFFERENTIAL (CANCER CENTER ONLY)
Abs Immature Granulocytes: 0.71 10*3/uL — ABNORMAL HIGH (ref 0.00–0.07)
Basophils Absolute: 0.1 10*3/uL (ref 0.0–0.1)
Basophils Relative: 1 %
Eosinophils Absolute: 0.2 10*3/uL (ref 0.0–0.5)
Eosinophils Relative: 2 %
HCT: 32.2 % — ABNORMAL LOW (ref 36.0–46.0)
Hemoglobin: 10.6 g/dL — ABNORMAL LOW (ref 12.0–15.0)
Immature Granulocytes: 6 %
Lymphocytes Relative: 15 %
Lymphs Abs: 1.8 10*3/uL (ref 0.7–4.0)
MCH: 30.6 pg (ref 26.0–34.0)
MCHC: 32.9 g/dL (ref 30.0–36.0)
MCV: 93.1 fL (ref 80.0–100.0)
Monocytes Absolute: 1.2 10*3/uL — ABNORMAL HIGH (ref 0.1–1.0)
Monocytes Relative: 11 %
Neutro Abs: 7.5 10*3/uL (ref 1.7–7.7)
Neutrophils Relative %: 65 %
Platelet Count: 167 10*3/uL (ref 150–400)
RBC: 3.46 MIL/uL — ABNORMAL LOW (ref 3.87–5.11)
RDW: 16.8 % — ABNORMAL HIGH (ref 11.5–15.5)
Smear Review: NORMAL
WBC Count: 11.4 10*3/uL — ABNORMAL HIGH (ref 4.0–10.5)
nRBC: 0.3 % — ABNORMAL HIGH (ref 0.0–0.2)

## 2023-06-20 LAB — BASIC METABOLIC PANEL - CANCER CENTER ONLY
Anion gap: 7 (ref 5–15)
BUN: 14 mg/dL (ref 6–20)
CO2: 19 mmol/L — ABNORMAL LOW (ref 22–32)
Calcium: 8.9 mg/dL (ref 8.9–10.3)
Chloride: 103 mmol/L (ref 98–111)
Creatinine: 0.7 mg/dL (ref 0.44–1.00)
GFR, Estimated: >60 mL/min (ref >60)
Glucose, Bld: 134 mg/dL — ABNORMAL HIGH (ref 70–99)
Potassium: 3.7 mmol/L (ref 3.5–5.1)
Sodium: 129 mmol/L — ABNORMAL LOW (ref 135–145)

## 2023-06-20 MED ORDER — SODIUM CHLORIDE 0.9% FLUSH
10.0000 mL | Freq: Once | INTRAVENOUS | Status: AC
  Administered 2023-06-20: 10 mL via INTRAVENOUS
  Filled 2023-06-20: qty 10

## 2023-06-20 MED ORDER — HEPARIN SOD (PORK) LOCK FLUSH 100 UNIT/ML IV SOLN
500.0000 [IU] | Freq: Once | INTRAVENOUS | Status: AC | PRN
  Administered 2023-06-20: 500 [IU]
  Filled 2023-06-20: qty 5

## 2023-06-20 MED ORDER — SODIUM CHLORIDE 0.9 % IV SOLN
INTRAVENOUS | Status: DC
  Filled 2023-06-20 (×2): qty 250

## 2023-06-20 NOTE — Progress Notes (Signed)
1030-spoke with Dr. Orlie Dakin. Sodium level today is 129 which is dropping from last lab check. Provider will like to check metb at end of week. No iv fluids will be scheduled at this time due to hyponatremia. I discussed plan of care with patient. She gave verbal understanding. Apt was sch for recheck labs on Thursday 8/22

## 2023-06-21 ENCOUNTER — Other Ambulatory Visit: Payer: Self-pay

## 2023-06-23 ENCOUNTER — Inpatient Hospital Stay: Payer: 59

## 2023-06-23 ENCOUNTER — Ambulatory Visit: Payer: 59

## 2023-06-23 DIAGNOSIS — Z510 Encounter for antineoplastic radiation therapy: Secondary | ICD-10-CM | POA: Diagnosis not present

## 2023-06-23 DIAGNOSIS — E871 Hypo-osmolality and hyponatremia: Secondary | ICD-10-CM

## 2023-06-23 DIAGNOSIS — E860 Dehydration: Secondary | ICD-10-CM

## 2023-06-23 DIAGNOSIS — C801 Malignant (primary) neoplasm, unspecified: Secondary | ICD-10-CM

## 2023-06-23 LAB — BASIC METABOLIC PANEL - CANCER CENTER ONLY
Anion gap: 7 (ref 5–15)
BUN: 12 mg/dL (ref 6–20)
CO2: 20 mmol/L — ABNORMAL LOW (ref 22–32)
Calcium: 9 mg/dL (ref 8.9–10.3)
Chloride: 104 mmol/L (ref 98–111)
Creatinine: 0.68 mg/dL (ref 0.44–1.00)
GFR, Estimated: >60 mL/min (ref >60)
Glucose, Bld: 103 mg/dL — ABNORMAL HIGH (ref 70–99)
Potassium: 3.9 mmol/L (ref 3.5–5.1)
Sodium: 131 mmol/L — ABNORMAL LOW (ref 135–145)

## 2023-06-24 MED FILL — Dexamethasone Sodium Phosphate Inj 100 MG/10ML: INTRAMUSCULAR | Qty: 1 | Status: AC

## 2023-06-27 ENCOUNTER — Inpatient Hospital Stay: Payer: 59

## 2023-06-27 ENCOUNTER — Inpatient Hospital Stay (HOSPITAL_BASED_OUTPATIENT_CLINIC_OR_DEPARTMENT_OTHER): Payer: 59 | Admitting: Oncology

## 2023-06-27 ENCOUNTER — Other Ambulatory Visit: Payer: Self-pay | Admitting: Oncology

## 2023-06-27 ENCOUNTER — Encounter: Payer: Self-pay | Admitting: Oncology

## 2023-06-27 ENCOUNTER — Ambulatory Visit: Payer: 59

## 2023-06-27 VITALS — BP 135/90 | HR 99 | Temp 97.6°F | Resp 18 | Wt 176.4 lb

## 2023-06-27 DIAGNOSIS — C801 Malignant (primary) neoplasm, unspecified: Secondary | ICD-10-CM | POA: Diagnosis not present

## 2023-06-27 DIAGNOSIS — Z510 Encounter for antineoplastic radiation therapy: Secondary | ICD-10-CM | POA: Diagnosis not present

## 2023-06-27 DIAGNOSIS — E860 Dehydration: Secondary | ICD-10-CM

## 2023-06-27 LAB — CBC WITH DIFFERENTIAL (CANCER CENTER ONLY)
Abs Immature Granulocytes: 0.18 10*3/uL — ABNORMAL HIGH (ref 0.00–0.07)
Basophils Absolute: 0.1 10*3/uL (ref 0.0–0.1)
Basophils Relative: 1 %
Eosinophils Absolute: 0.1 10*3/uL (ref 0.0–0.5)
Eosinophils Relative: 1 %
HCT: 30.3 % — ABNORMAL LOW (ref 36.0–46.0)
Hemoglobin: 10.1 g/dL — ABNORMAL LOW (ref 12.0–15.0)
Immature Granulocytes: 2 %
Lymphocytes Relative: 18 %
Lymphs Abs: 1.9 10*3/uL (ref 0.7–4.0)
MCH: 31.2 pg (ref 26.0–34.0)
MCHC: 33.3 g/dL (ref 30.0–36.0)
MCV: 93.5 fL (ref 80.0–100.0)
Monocytes Absolute: 1.1 10*3/uL — ABNORMAL HIGH (ref 0.1–1.0)
Monocytes Relative: 10 %
Neutro Abs: 7.1 10*3/uL (ref 1.7–7.7)
Neutrophils Relative %: 68 %
Platelet Count: 185 10*3/uL (ref 150–400)
RBC: 3.24 MIL/uL — ABNORMAL LOW (ref 3.87–5.11)
RDW: 16.3 % — ABNORMAL HIGH (ref 11.5–15.5)
WBC Count: 10.3 10*3/uL (ref 4.0–10.5)
nRBC: 0.7 % — ABNORMAL HIGH (ref 0.0–0.2)

## 2023-06-27 LAB — CMP (CANCER CENTER ONLY)
ALT: 21 U/L (ref 0–44)
AST: 36 U/L (ref 15–41)
Albumin: 4 g/dL (ref 3.5–5.0)
Alkaline Phosphatase: 423 U/L — ABNORMAL HIGH (ref 38–126)
Anion gap: 5 (ref 5–15)
BUN: 11 mg/dL (ref 6–20)
CO2: 20 mmol/L — ABNORMAL LOW (ref 22–32)
Calcium: 8.8 mg/dL — ABNORMAL LOW (ref 8.9–10.3)
Chloride: 106 mmol/L (ref 98–111)
Creatinine: 0.61 mg/dL (ref 0.44–1.00)
GFR, Estimated: >60 mL/min (ref >60)
Glucose, Bld: 121 mg/dL — ABNORMAL HIGH (ref 70–99)
Potassium: 3.9 mmol/L (ref 3.5–5.1)
Sodium: 131 mmol/L — ABNORMAL LOW (ref 135–145)
Total Bilirubin: 0.6 mg/dL (ref 0.3–1.2)
Total Protein: 7.8 g/dL (ref 6.5–8.1)

## 2023-06-27 LAB — PREGNANCY, URINE: Preg Test, Ur: POSITIVE — AB

## 2023-06-27 LAB — HCG, QUANTITATIVE, PREGNANCY: hCG, Beta Chain, Quant, S: 101 m[IU]/mL — ABNORMAL HIGH (ref <5)

## 2023-06-27 MED ORDER — PALONOSETRON HCL INJECTION 0.25 MG/5ML
0.2500 mg | Freq: Once | INTRAVENOUS | Status: AC
  Administered 2023-06-27: 0.25 mg via INTRAVENOUS
  Filled 2023-06-27: qty 5

## 2023-06-27 MED ORDER — SODIUM CHLORIDE 0.9 % IV SOLN
2400.0000 mg/m2 | INTRAVENOUS | Status: DC
  Administered 2023-06-27: 4500 mg via INTRAVENOUS
  Filled 2023-06-27: qty 90

## 2023-06-27 MED ORDER — LEUCOVORIN CALCIUM INJECTION 350 MG
400.0000 mg/m2 | Freq: Once | INTRAVENOUS | Status: AC
  Administered 2023-06-27: 748 mg via INTRAVENOUS
  Filled 2023-06-27: qty 37.4

## 2023-06-27 MED ORDER — SODIUM CHLORIDE 0.9 % IV SOLN
200.0000 mg | Freq: Once | INTRAVENOUS | Status: AC
  Administered 2023-06-27: 200 mg via INTRAVENOUS
  Filled 2023-06-27: qty 8

## 2023-06-27 MED ORDER — OXALIPLATIN CHEMO INJECTION 100 MG/20ML
85.0000 mg/m2 | Freq: Once | INTRAVENOUS | Status: AC
  Administered 2023-06-27: 150 mg via INTRAVENOUS
  Filled 2023-06-27: qty 30

## 2023-06-27 MED ORDER — FLUOROURACIL CHEMO INJECTION 2.5 GM/50ML
400.0000 mg/m2 | Freq: Once | INTRAVENOUS | Status: AC
  Administered 2023-06-27: 750 mg via INTRAVENOUS
  Filled 2023-06-27: qty 15

## 2023-06-27 MED ORDER — DEXTROSE 5 % IV SOLN
Freq: Once | INTRAVENOUS | Status: AC
  Filled 2023-06-27: qty 250

## 2023-06-27 MED ORDER — ZOLEDRONIC ACID 4 MG/100ML IV SOLN
4.0000 mg | Freq: Once | INTRAVENOUS | Status: AC
  Administered 2023-06-27: 4 mg via INTRAVENOUS
  Filled 2023-06-27: qty 100

## 2023-06-27 MED ORDER — SODIUM CHLORIDE 0.9 % IV SOLN
10.0000 mg | Freq: Once | INTRAVENOUS | Status: AC
  Administered 2023-06-27: 10 mg via INTRAVENOUS
  Filled 2023-06-27: qty 10

## 2023-06-27 NOTE — Patient Instructions (Signed)
[facility]  Discharge Instructions: Thank you for choosing [facility] to provide your oncology and hematology care.  If you have a lab appointment with the Cancer Center, please go directly to the Cancer Center and check in at the registration area.  Wear comfortable clothing and clothing appropriate for easy access to any Portacath or PICC line.   We strive to give you quality time with your provider. You may need to reschedule your appointment if you arrive late (15 or more minutes).  Arriving late affects you and other patients whose appointments are after yours.  Also, if you miss three or more appointments without notifying the office, you may be dismissed from the clinic at the provider's discretion.      For prescription refill requests, have your pharmacy contact our office and allow 72 hours for refills to be completed.    Today you received the following chemotherapy and/or immunotherapy agents Leucovorin, Keytruda, Oxaliplatin, 5FU and pump.      To help prevent nausea and vomiting after your treatment, we encourage you to take your nausea medication as directed.  BELOW ARE SYMPTOMS THAT SHOULD BE REPORTED IMMEDIATELY: *FEVER GREATER THAN 100.4 F (38 C) OR HIGHER *CHILLS OR SWEATING *NAUSEA AND VOMITING THAT IS NOT CONTROLLED WITH YOUR NAUSEA MEDICATION *UNUSUAL SHORTNESS OF BREATH *UNUSUAL BRUISING OR BLEEDING *URINARY PROBLEMS (pain or burning when urinating, or frequent urination) *BOWEL PROBLEMS (unusual diarrhea, constipation, pain near the anus) TENDERNESS IN MOUTH AND THROAT WITH OR WITHOUT PRESENCE OF ULCERS (sore throat, sores in mouth, or a toothache) UNUSUAL RASH, SWELLING OR PAIN  UNUSUAL VAGINAL DISCHARGE OR ITCHING   Items with * indicate a potential emergency and should be followed up as soon as possible or go to the Emergency Department if any problems should occur.  Please show the CHEMOTHERAPY ALERT CARD or  IMMUNOTHERAPY ALERT CARD at check-in to the Emergency Department and triage nurse.  Should you have questions after your visit or need to cancel or reschedule your appointment, please contact [facility]  [phone] and follow the prompts.  Office hours are 8:00 a.m. to 4:30 p.m. Monday - Friday. Please note that voicemails left after 4:00 p.m. may not be returned until the following business day.  We are closed weekends and major holidays. You have access to a nurse at all times for urgent questions. Please call the main number to the clinic [phone] and follow the prompts.  For any non-urgent questions, you may also contact your provider using MyChart. We now offer e-Visits for anyone 17 and older to request care online for non-urgent symptoms. For details visit [email or web address].   Also download the MyChart app! Go to the app store, search "MyChart", open the app, select [place], and log in with your MyChart username and password.

## 2023-06-27 NOTE — Progress Notes (Signed)
Patient states she's been feeling really tired, has right leg pain, and has some bleeding. She thinks bleeding may be coming form the mass. Patient also has hot flashes frequently.

## 2023-06-27 NOTE — Progress Notes (Signed)
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  INTERVAL HISTORY: Patient returns to clinic today for further evaluation and consideration of cycle 6 of FOLFOX plus Keytruda.  She continues to have chronic back and leg pain, but is initiating palliative XRT.  She is tolerating her treatments well without significant side effects. She has an improved appetite.  She has no neurologic complaints.  She denies any recent fevers or illnesses.  She has no chest pain, shortness of breath, cough, or hemoptysis.  She denies any nausea, vomiting, constipation, or diarrhea.  She has no urinary complaints.  Patient offers no further specific complaints today.  REVIEW OF SYSTEMS:   Review of Systems  Constitutional:  Positive for malaise/fatigue. Negative for fever and weight loss.  Respiratory: Negative.  Negative for cough, hemoptysis and shortness of breath.   Cardiovascular: Negative.  Negative for chest pain and leg swelling.  Gastrointestinal: Negative.  Negative for abdominal pain.  Genitourinary: Negative.  Negative for dysuria and frequency.  Musculoskeletal:  Positive for back pain.  Skin: Negative.  Negative for rash.  Neurological: Negative.  Negative for dizziness, focal weakness, weakness and headaches.  Psychiatric/Behavioral: Negative.  The patient is not nervous/anxious.     As per HPI. Otherwise, a complete review of systems is negative.  PAST MEDICAL HISTORY: Past Medical History:  Diagnosis Date   Anxiety    Cancer (HCC)     Depression    Heart murmur    Hypertension     PAST SURGICAL HISTORY: Past Surgical History:  Procedure Laterality Date   CESAREAN SECTION     COLONOSCOPY WITH PROPOFOL N/A 02/21/2023   Procedure: COLONOSCOPY WITH PROPOFOL;  Surgeon: Regis Bill, MD;  Location: [facility] ENDOSCOPY;  Service: Endoscopy;  Laterality: N/A;   DILATION AND CURETTAGE OF UTERUS     IR IMAGING GUIDED PORT INSERTION  02/16/2023    FAMILY HISTORY: Family History  Problem Relation Age of Onset   Cancer Maternal Grandmother     ADVANCED DIRECTIVES (Y/N):  N  HEALTH MAINTENANCE: Social History   Tobacco Use   Smoking status: Former    Current packs/day: 0.00    Types: Cigarettes    Quit date: 12/19/2022    Years since quitting: 0.5   Smokeless tobacco: Never   Tobacco comments:    Quit smoking 12/2022  Vaping Use   Vaping status: Never Used  Substance Use Topics   Alcohol use: Yes    Comment: occ   Drug use: No     Colonoscopy:  PAP:  Bone density:  Lipid panel:  Allergies  Allergen Reactions   Nifedipine Palpitations    Current Outpatient Medications  Medication Sig Dispense Refill   cilostazol (PLETAL) 100 MG tablet Take 100 mg by mouth 2 (two) times daily.     cloNIDine (CATAPRES) 0.1 MG tablet Take 1 tablet (0.1 mg total) by mouth daily as needed (Sustained Systolic pressure of 200 or higher). 20 tablet 0   cyanocobalamin (VITAMIN B12) 500 MCG tablet Take 500 mcg by mouth daily.     DULoxetine (CYMBALTA) 30 MG capsule Take 30 mg by mouth  daily.     gabapentin (NEURONTIN) 300 MG capsule Take 300 mg by mouth 3 (three) times daily.     hydrocortisone 2.5 % cream Apply 1 Application topically 2 (two) times daily.     lidocaine-prilocaine (EMLA) cream Apply to affected area once 30 g 3   Multiple Vitamin (MULTIVITAMIN) tablet Take 1 tablet by mouth daily.     naloxone (NARCAN) nasal spray 4 mg/0.1 mL Place 1 spray into the nose once.     omeprazole (PRILOSEC) 20 MG capsule Take 20 mg  by mouth daily.     ondansetron (ZOFRAN) 8 MG tablet Take 1 tablet (8 mg total) by mouth every 8 (eight) hours as needed for nausea or vomiting. Start on the third day after chemotherapy. 60 tablet 2   prochlorperazine (COMPAZINE) 10 MG tablet Take 1 tablet (10 mg total) by mouth every 6 (six) hours as needed for nausea or vomiting. 60 tablet 2   tiZANidine (ZANAFLEX) 2 MG tablet Take 2 mg by mouth at bedtime.     traMADol (ULTRAM-ER) 100 MG 24 hr tablet Take 100 mg by mouth daily.     Vitamin D, Ergocalciferol, (DRISDOL) 1.25 MG (50000 UNIT) CAPS capsule Take 50,000 Units by mouth every 7 (seven) days.     albuterol (PROVENTIL) (2.5 MG/3ML) 0.083% nebulizer solution Take 2.5 mg by nebulization every 6 (six) hours as needed for wheezing or shortness of breath. (Patient not taking: Reported on 06/13/2023)     atenolol-chlorthalidone (TENORETIC) 50-25 MG tablet Take 1 tablet by mouth daily. (Patient not taking: Reported on 06/13/2023)     Buprenorphine HCl-Naloxone HCl 4-1 MG FILM Place 4 mg of opioid under the tongue every 12 (twelve) hours. (Patient not taking: Reported on 06/13/2023)     cetirizine (ZYRTEC ALLERGY) 10 MG tablet Take 1 tablet (10 mg total) by mouth daily. (Patient not taking: Reported on 06/13/2023) 30 tablet 0   megestrol (MEGACE) 40 MG tablet TAKE 1 TABLET BY MOUTH EVERY DAY 30 tablet 2   ondansetron (ZOFRAN-ODT) 4 MG disintegrating tablet Take 4 mg by mouth 2 (two) times daily as needed for nausea or vomiting. (Patient not taking: Reported on 06/13/2023)     oxyCODONE-acetaminophen (PERCOCET/ROXICET) 5-325 MG tablet Take 1 tablet by mouth 2 (two) times daily. (Patient not taking: Reported on 04/04/2023)     polyethylene glycol (MIRALAX / GLYCOLAX) 17 g packet Take 17 g by mouth daily. (Patient not taking: Reported on 06/13/2023)     SENEXON-S 8.6-50 MG tablet Take 1 tablet by mouth 2 (two) times daily. (Patient not taking: Reported on 06/13/2023)     No current facility-administered  medications for this visit.   Facility-Administered Medications Ordered in Other Visits  Medication Dose Route Frequency Provider Last Rate Last Admin   fluorouracil (ADRUCIL) 4,500 mg in sodium chloride 0.9 % 60 mL chemo infusion  2,400 mg/m2 (Treatment Plan Recorded) Intravenous 1 day or 1 dose Orlie Dakin, Tollie Pizza, MD       fluorouracil (ADRUCIL) chemo injection 750 mg  400 mg/m2 (Treatment Plan Recorded) Intravenous Once Jeralyn Ruths, MD       leucovorin 748 mg in dextrose 5 % 250 mL infusion  400 mg/m2 (Treatment Plan Recorded) Intravenous Once Jeralyn Ruths, MD 144 mL/hr at 06/27/23 1239 748 mg at 06/27/23 1239   oxaliplatin (ELOXATIN) 150 mg in dextrose 5 % 500 mL chemo infusion  85 mg/m2 (Treatment Plan Recorded) Intravenous Once Jeralyn Ruths, MD 265 mL/hr at 06/27/23 1237 150 mg at  06/27/23 1237    OBJECTIVE: Vitals:   06/27/23 0916  BP: (!) 135/90  Pulse: 99  Resp: 18  Temp: 97.6 F (36.4 C)  SpO2: 100%      Body mass index is 34.45 kg/m.    ECOG FS:1 - Symptomatic but completely ambulatory  General: Well-developed, well-nourished, no acute distress. Eyes: Pink conjunctiva, anicteric sclera. HEENT: Normocephalic, moist mucous membranes. Lungs: No audible wheezing or coughing. Heart: Regular rate and rhythm. Abdomen: Soft, nontender, no obvious distention. Musculoskeletal: No edema, cyanosis, or clubbing. Neuro: Alert, answering all questions appropriately. Cranial nerves grossly intact. Skin: No rashes or petechiae noted. Psych: Normal affect.  LAB RESULTS:  Lab Results  Component Value Date   NA 131 (L) 06/27/2023   K 3.9 06/27/2023   CL 106 06/27/2023   CO2 20 (L) 06/27/2023   GLUCOSE 121 (H) 06/27/2023   BUN 11 06/27/2023   CREATININE 0.61 06/27/2023   CALCIUM 8.8 (L) 06/27/2023   PROT 7.8 06/27/2023   ALBUMIN 4.0 06/27/2023   AST 36 06/27/2023   ALT 21 06/27/2023   ALKPHOS 423 (H) 06/27/2023   BILITOT 0.6 06/27/2023   GFRNONAA >60  06/27/2023   GFRAA >60 09/27/2018    Lab Results  Component Value Date   WBC 10.3 06/27/2023   NEUTROABS 7.1 06/27/2023   HGB 10.1 (L) 06/27/2023   HCT 30.3 (L) 06/27/2023   MCV 93.5 06/27/2023   PLT 185 06/27/2023     STUDIES: No results found.  ONCOLOGY: Patient's workup was initiated at [facility] in February 2024 at which point PET scan revealed widely metastatic disease with innumerable sclerotic bony lesions as well as axillary, mediastinal, and abdominal lymphadenopathy.  Subsequent biopsy of a right axillary lymph node revealed signet ring adenocarcinoma, likely GI origin.  Additional testing revealed high tumor mutational burden.  Further workup at [facility] in April 2024 revealed a negative EGD, and negative CT scanning of the brain.  CT scan of the abdomen and follow-up pelvic ultrasound revealed a large left adnexal mass that was not commented on PET scan from February.  CA 19-9 is normal, but CA125 and CEA are mildly elevated.    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  Repeat PET scan on February 24, 2023 reviewed independently confirming widespread metastatic disease.  Left adnexal mass is now PET positive of unclear clinical significance and after evaluation by gyn-onc, ultimately would not change the overall treatment plan.  Patient will receive FOLFOX plus Keytruda every 3 weeks for 12 cycles followed by maintenance Keytruda for at least 2 years.  Patient also received Zometa on even number cycles.  She has had had port placed.  Proceed with cycle 6 today.  Return to clinic in 2 days for pump removal and fulphilia.  She will then return to clinic in 3 weeks for further evaluation and consideration of cycle 6.  Will reimage with PET scan 1 week prior to next infusion.   Adnexal mass: Repeat PET scan as above.  Awaiting  pathology from outside biopsy for further evaluation.  Appreciate gyn-onc input. Pain: Continue follow-up with her physician in the pain clinic in [place], [place].  Proceed with XRT as scheduled.   Bony disease: Zometa on even number cycles as above. Transaminitis: Resolved. Anemia: Chronic and unchanged.  Patient's hemoglobin is 10.1 today. Neutropenia: Resolved.  Continue Fulphilia with pump removal  as above. Weight loss: Resolved.  Patient reports she is now gaining weight. Hypertension: Improved.  Patient was evaluated in the emergency room last week. 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 Patient is sexually active, menstruating, and not on birth control however. She verbalizes consent to proceed with treatment after previous discussion of potential life threatening or damaging effects to embryo. Megace not likely to cause ovarian suppression.  Patient also previously declined pelvic ultrasound to evaluate for pregnancy.  Patient previously discussed with nurse practitioner birth control including abstinence, condoms, OCPs, Depo-Lupron, vs IUD. Discussed pros and cons of each. She will think about these and let us know how she would like to proceed.  Poor appetite: Continue Megace as prescribed.  Patient expressed understanding and was in agreement with this plan. She also understands that She can call clinic at any time with any questions, concerns, or complaints.    Cancer Staging  Signet ring cell adenocarcinoma [facility]) Staging form: Exocrine Pancreas, AJCC 8th Edition - Clinical stage from 02/07/2023: Stage IV (cTX, cNX, pM1) - Signed by Jeralyn Ruths, MD on 02/14/2023 Stage prefix: Initial diagnosis   Jeralyn Ruths, MD   06/27/2023 1:42 PM

## 2023-06-28 ENCOUNTER — Ambulatory Visit: Payer: 59

## 2023-06-28 ENCOUNTER — Other Ambulatory Visit: Payer: Self-pay

## 2023-06-29 ENCOUNTER — Ambulatory Visit: Payer: 59

## 2023-06-29 ENCOUNTER — Inpatient Hospital Stay: Payer: 59

## 2023-06-29 VITALS — BP 134/81 | HR 100 | Temp 97.8°F | Resp 18

## 2023-06-29 DIAGNOSIS — Z510 Encounter for antineoplastic radiation therapy: Secondary | ICD-10-CM | POA: Diagnosis not present

## 2023-06-29 DIAGNOSIS — C801 Malignant (primary) neoplasm, unspecified: Secondary | ICD-10-CM

## 2023-06-29 MED ORDER — SODIUM CHLORIDE 0.9% FLUSH
10.0000 mL | INTRAVENOUS | Status: AC | PRN
  Administered 2023-06-29: 10 mL
  Filled 2023-06-29: qty 10

## 2023-06-29 MED ORDER — HEPARIN SOD (PORK) LOCK FLUSH 100 UNIT/ML IV SOLN
500.0000 [IU] | Freq: Once | INTRAVENOUS | Status: AC | PRN
  Administered 2023-06-29: 500 [IU]
  Filled 2023-06-29: qty 5

## 2023-06-29 MED ORDER — PEGFILGRASTIM-JMDB 6 MG/0.6ML ~~LOC~~ SOSY
6.0000 mg | PREFILLED_SYRINGE | Freq: Once | SUBCUTANEOUS | Status: AC
  Administered 2023-06-29: 6 mg via SUBCUTANEOUS
  Filled 2023-06-29: qty 0.6

## 2023-06-30 ENCOUNTER — Ambulatory Visit: Payer: 59

## 2023-07-01 ENCOUNTER — Ambulatory Visit: Payer: 59

## 2023-07-01 ENCOUNTER — Other Ambulatory Visit: Payer: Self-pay | Admitting: *Deleted

## 2023-07-01 DIAGNOSIS — C801 Malignant (primary) neoplasm, unspecified: Secondary | ICD-10-CM

## 2023-07-05 ENCOUNTER — Other Ambulatory Visit: Payer: Self-pay | Admitting: *Deleted

## 2023-07-05 ENCOUNTER — Ambulatory Visit: Payer: 59

## 2023-07-05 ENCOUNTER — Inpatient Hospital Stay: Payer: 59

## 2023-07-05 ENCOUNTER — Inpatient Hospital Stay: Payer: 59 | Attending: Oncology

## 2023-07-05 ENCOUNTER — Ambulatory Visit: Admission: RE | Admit: 2023-07-05 | Payer: 59 | Source: Ambulatory Visit

## 2023-07-05 DIAGNOSIS — D649 Anemia, unspecified: Secondary | ICD-10-CM | POA: Insufficient documentation

## 2023-07-05 DIAGNOSIS — Z7902 Long term (current) use of antithrombotics/antiplatelets: Secondary | ICD-10-CM | POA: Diagnosis not present

## 2023-07-05 DIAGNOSIS — Z5112 Encounter for antineoplastic immunotherapy: Secondary | ICD-10-CM | POA: Insufficient documentation

## 2023-07-05 DIAGNOSIS — C801 Malignant (primary) neoplasm, unspecified: Secondary | ICD-10-CM

## 2023-07-05 DIAGNOSIS — R7401 Elevation of levels of liver transaminase levels: Secondary | ICD-10-CM | POA: Insufficient documentation

## 2023-07-05 DIAGNOSIS — Z87891 Personal history of nicotine dependence: Secondary | ICD-10-CM | POA: Diagnosis not present

## 2023-07-05 DIAGNOSIS — C7951 Secondary malignant neoplasm of bone: Secondary | ICD-10-CM | POA: Insufficient documentation

## 2023-07-05 DIAGNOSIS — I10 Essential (primary) hypertension: Secondary | ICD-10-CM | POA: Diagnosis not present

## 2023-07-05 DIAGNOSIS — Z79899 Other long term (current) drug therapy: Secondary | ICD-10-CM | POA: Diagnosis not present

## 2023-07-05 DIAGNOSIS — Z5111 Encounter for antineoplastic chemotherapy: Secondary | ICD-10-CM | POA: Insufficient documentation

## 2023-07-05 DIAGNOSIS — Z5189 Encounter for other specified aftercare: Secondary | ICD-10-CM | POA: Insufficient documentation

## 2023-07-05 DIAGNOSIS — E860 Dehydration: Secondary | ICD-10-CM | POA: Diagnosis not present

## 2023-07-05 DIAGNOSIS — Z510 Encounter for antineoplastic radiation therapy: Secondary | ICD-10-CM | POA: Insufficient documentation

## 2023-07-05 DIAGNOSIS — R748 Abnormal levels of other serum enzymes: Secondary | ICD-10-CM | POA: Diagnosis not present

## 2023-07-05 DIAGNOSIS — F419 Anxiety disorder, unspecified: Secondary | ICD-10-CM | POA: Diagnosis not present

## 2023-07-05 LAB — CBC (CANCER CENTER ONLY)
HCT: 32 % — ABNORMAL LOW (ref 36.0–46.0)
Hemoglobin: 10.5 g/dL — ABNORMAL LOW (ref 12.0–15.0)
MCH: 30.9 pg (ref 26.0–34.0)
MCHC: 32.8 g/dL (ref 30.0–36.0)
MCV: 94.1 fL (ref 80.0–100.0)
Platelet Count: 130 10*3/uL — ABNORMAL LOW (ref 150–400)
RBC: 3.4 MIL/uL — ABNORMAL LOW (ref 3.87–5.11)
RDW: 15.5 % (ref 11.5–15.5)
WBC Count: 11 10*3/uL — ABNORMAL HIGH (ref 4.0–10.5)
nRBC: 0.5 % — ABNORMAL HIGH (ref 0.0–0.2)

## 2023-07-05 MED ORDER — SODIUM CHLORIDE 0.9% FLUSH
10.0000 mL | Freq: Once | INTRAVENOUS | Status: DC | PRN
  Filled 2023-07-05: qty 10

## 2023-07-05 MED ORDER — HEPARIN SOD (PORK) LOCK FLUSH 100 UNIT/ML IV SOLN
500.0000 [IU] | Freq: Once | INTRAVENOUS | Status: DC | PRN
  Filled 2023-07-05: qty 5

## 2023-07-05 MED ORDER — SODIUM CHLORIDE 0.9 % IV SOLN
INTRAVENOUS | Status: DC
  Filled 2023-07-05 (×2): qty 250

## 2023-07-05 MED ORDER — DIAZEPAM 5 MG PO TABS: 10.0000 mg | ORAL_TABLET | Freq: Once | ORAL | 0 refills | Status: AC

## 2023-07-06 ENCOUNTER — Ambulatory Visit: Payer: 59

## 2023-07-06 ENCOUNTER — Ambulatory Visit
Admission: RE | Admit: 2023-07-06 | Discharge: 2023-07-06 | Disposition: A | Discharge status: Home / Self Care | Payer: 59 | Source: Ambulatory Visit | Attending: Oncology | Admitting: Oncology

## 2023-07-06 ENCOUNTER — Ambulatory Visit
Admission: RE | Admit: 2023-07-06 | Discharge: 2023-07-06 | Disposition: A | Discharge status: Home / Self Care | Payer: 59 | Source: Ambulatory Visit | Attending: Radiation Oncology | Admitting: Radiation Oncology

## 2023-07-06 ENCOUNTER — Other Ambulatory Visit: Payer: Self-pay

## 2023-07-06 VITALS — Wt 171.8 lb

## 2023-07-06 DIAGNOSIS — Z5112 Encounter for antineoplastic immunotherapy: Secondary | ICD-10-CM | POA: Diagnosis not present

## 2023-07-06 DIAGNOSIS — C801 Malignant (primary) neoplasm, unspecified: Secondary | ICD-10-CM | POA: Insufficient documentation

## 2023-07-06 LAB — RAD ONC ARIA SESSION SUMMARY
Course Elapsed Days: 0
Plan Fractions Treated to Date: 1
Plan Prescribed Dose Per Fraction: 3 Gy
Plan Total Fractions Prescribed: 10
Plan Total Prescribed Dose: 30 Gy
Reference Point Dosage Given to Date: 3 Gy
Reference Point Session Dosage Given: 3 Gy
Session Number: 1

## 2023-07-06 LAB — GLUCOSE, CAPILLARY: Glucose-Capillary: 93 mg/dL (ref 70–99)

## 2023-07-06 MED ORDER — FLUDEOXYGLUCOSE F - 18 (FDG) INJECTION
9.6100 | Freq: Once | INTRAVENOUS | Status: AC | PRN
  Administered 2023-07-06: 9.61 via INTRAVENOUS

## 2023-07-07 ENCOUNTER — Ambulatory Visit
Admission: RE | Admit: 2023-07-07 | Discharge: 2023-07-07 | Disposition: A | Discharge status: Home / Self Care | Payer: 59 | Source: Ambulatory Visit | Attending: Radiation Oncology | Admitting: Radiation Oncology

## 2023-07-07 ENCOUNTER — Ambulatory Visit: Payer: 59

## 2023-07-07 ENCOUNTER — Other Ambulatory Visit: Payer: Self-pay

## 2023-07-07 ENCOUNTER — Inpatient Hospital Stay (HOSPITAL_BASED_OUTPATIENT_CLINIC_OR_DEPARTMENT_OTHER): Payer: 59 | Admitting: Hospice and Palliative Medicine

## 2023-07-07 DIAGNOSIS — C801 Malignant (primary) neoplasm, unspecified: Secondary | ICD-10-CM

## 2023-07-07 DIAGNOSIS — Z5112 Encounter for antineoplastic immunotherapy: Secondary | ICD-10-CM | POA: Diagnosis not present

## 2023-07-07 LAB — RAD ONC ARIA SESSION SUMMARY
Course Elapsed Days: 1
Plan Fractions Treated to Date: 2
Plan Prescribed Dose Per Fraction: 3 Gy
Plan Total Fractions Prescribed: 10
Plan Total Prescribed Dose: 30 Gy
Reference Point Dosage Given to Date: 6 Gy
Reference Point Session Dosage Given: 3 Gy
Session Number: 2

## 2023-07-07 NOTE — Progress Notes (Signed)
VM left 

## 2023-07-08 ENCOUNTER — Other Ambulatory Visit: Payer: Self-pay

## 2023-07-08 ENCOUNTER — Ambulatory Visit
Admission: RE | Admit: 2023-07-08 | Discharge: 2023-07-08 | Disposition: A | Discharge status: Home / Self Care | Payer: 59 | Source: Ambulatory Visit | Attending: Radiation Oncology | Admitting: Radiation Oncology

## 2023-07-08 ENCOUNTER — Ambulatory Visit: Payer: 59

## 2023-07-08 DIAGNOSIS — Z5112 Encounter for antineoplastic immunotherapy: Secondary | ICD-10-CM | POA: Diagnosis not present

## 2023-07-08 LAB — RAD ONC ARIA SESSION SUMMARY
Course Elapsed Days: 2
Plan Fractions Treated to Date: 3
Plan Prescribed Dose Per Fraction: 3 Gy
Plan Total Fractions Prescribed: 10
Plan Total Prescribed Dose: 30 Gy
Reference Point Dosage Given to Date: 9 Gy
Reference Point Session Dosage Given: 3 Gy
Session Number: 3

## 2023-07-11 ENCOUNTER — Other Ambulatory Visit: Payer: Self-pay

## 2023-07-11 ENCOUNTER — Encounter: Payer: Self-pay | Admitting: Oncology

## 2023-07-11 ENCOUNTER — Ambulatory Visit: Payer: 59

## 2023-07-11 ENCOUNTER — Inpatient Hospital Stay: Payer: 59

## 2023-07-11 ENCOUNTER — Ambulatory Visit
Admission: RE | Admit: 2023-07-11 | Discharge: 2023-07-11 | Disposition: A | Discharge status: Home / Self Care | Payer: 59 | Source: Ambulatory Visit | Attending: Radiation Oncology | Admitting: Radiation Oncology

## 2023-07-11 DIAGNOSIS — Z5112 Encounter for antineoplastic immunotherapy: Secondary | ICD-10-CM | POA: Diagnosis not present

## 2023-07-11 LAB — RAD ONC ARIA SESSION SUMMARY
Course Elapsed Days: 5
Plan Fractions Treated to Date: 4
Plan Prescribed Dose Per Fraction: 3 Gy
Plan Total Fractions Prescribed: 10
Plan Total Prescribed Dose: 30 Gy
Reference Point Dosage Given to Date: 12 Gy
Reference Point Session Dosage Given: 3 Gy
Session Number: 4

## 2023-07-11 NOTE — Progress Notes (Signed)
[facility] CSW Progress Note  Clinical Child psychotherapist returned patient's call regarding support groups.  Provided information regarding support groups that would fit her cancer type, which is GI.  Told her that the next Living With Cancer would be on 10/21 from 2-3.  She was also interested in other activities at the center and she will contact Alena Bills to register.  She expressed no other needs.    Dorothey Baseman, LCSW Clinical Social Worker [facility]

## 2023-07-12 ENCOUNTER — Ambulatory Visit
Admission: RE | Admit: 2023-07-12 | Discharge: 2023-07-12 | Disposition: A | Discharge status: Home / Self Care | Payer: 59 | Source: Ambulatory Visit | Attending: Radiation Oncology | Admitting: Radiation Oncology

## 2023-07-12 ENCOUNTER — Inpatient Hospital Stay: Payer: 59

## 2023-07-12 ENCOUNTER — Other Ambulatory Visit: Payer: Self-pay

## 2023-07-12 DIAGNOSIS — Z5112 Encounter for antineoplastic immunotherapy: Secondary | ICD-10-CM | POA: Diagnosis not present

## 2023-07-12 LAB — RAD ONC ARIA SESSION SUMMARY
Course Elapsed Days: 6
Plan Fractions Treated to Date: 5
Plan Prescribed Dose Per Fraction: 3 Gy
Plan Total Fractions Prescribed: 10
Plan Total Prescribed Dose: 30 Gy
Reference Point Dosage Given to Date: 15 Gy
Reference Point Session Dosage Given: 3 Gy
Session Number: 5

## 2023-07-13 ENCOUNTER — Ambulatory Visit
Admission: RE | Admit: 2023-07-13 | Discharge: 2023-07-13 | Disposition: A | Discharge status: Home / Self Care | Payer: 59 | Source: Ambulatory Visit | Attending: Radiation Oncology | Admitting: Radiation Oncology

## 2023-07-13 ENCOUNTER — Other Ambulatory Visit: Payer: Self-pay

## 2023-07-13 DIAGNOSIS — Z5112 Encounter for antineoplastic immunotherapy: Secondary | ICD-10-CM | POA: Diagnosis not present

## 2023-07-13 LAB — RAD ONC ARIA SESSION SUMMARY
Course Elapsed Days: 7
Plan Fractions Treated to Date: 6
Plan Prescribed Dose Per Fraction: 3 Gy
Plan Total Fractions Prescribed: 10
Plan Total Prescribed Dose: 30 Gy
Reference Point Dosage Given to Date: 18 Gy
Reference Point Session Dosage Given: 3 Gy
Session Number: 6

## 2023-07-14 ENCOUNTER — Ambulatory Visit
Admission: RE | Admit: 2023-07-14 | Discharge: 2023-07-14 | Disposition: A | Discharge status: Home / Self Care | Payer: 59 | Source: Ambulatory Visit | Attending: Radiation Oncology | Admitting: Radiation Oncology

## 2023-07-14 ENCOUNTER — Other Ambulatory Visit: Payer: Self-pay

## 2023-07-14 DIAGNOSIS — Z5112 Encounter for antineoplastic immunotherapy: Secondary | ICD-10-CM | POA: Diagnosis not present

## 2023-07-14 LAB — RAD ONC ARIA SESSION SUMMARY
Course Elapsed Days: 8
Plan Fractions Treated to Date: 7
Plan Prescribed Dose Per Fraction: 3 Gy
Plan Total Fractions Prescribed: 10
Plan Total Prescribed Dose: 30 Gy
Reference Point Dosage Given to Date: 21 Gy
Reference Point Session Dosage Given: 3 Gy
Session Number: 7

## 2023-07-15 ENCOUNTER — Ambulatory Visit
Admission: RE | Admit: 2023-07-15 | Discharge: 2023-07-15 | Disposition: A | Discharge status: Home / Self Care | Payer: 59 | Source: Ambulatory Visit | Attending: Radiation Oncology | Admitting: Radiation Oncology

## 2023-07-15 ENCOUNTER — Other Ambulatory Visit: Payer: Self-pay

## 2023-07-15 DIAGNOSIS — Z5112 Encounter for antineoplastic immunotherapy: Secondary | ICD-10-CM | POA: Diagnosis not present

## 2023-07-15 LAB — RAD ONC ARIA SESSION SUMMARY
Course Elapsed Days: 9
Plan Fractions Treated to Date: 8
Plan Prescribed Dose Per Fraction: 3 Gy
Plan Total Fractions Prescribed: 10
Plan Total Prescribed Dose: 30 Gy
Reference Point Dosage Given to Date: 24 Gy
Reference Point Session Dosage Given: 3 Gy
Session Number: 8

## 2023-07-15 MED FILL — Dexamethasone Sodium Phosphate Inj 100 MG/10ML: INTRAMUSCULAR | Qty: 1 | Status: AC

## 2023-07-18 ENCOUNTER — Encounter: Payer: Self-pay | Admitting: Oncology

## 2023-07-18 ENCOUNTER — Inpatient Hospital Stay: Payer: 59

## 2023-07-18 ENCOUNTER — Other Ambulatory Visit: Payer: Self-pay

## 2023-07-18 ENCOUNTER — Ambulatory Visit
Admission: RE | Admit: 2023-07-18 | Discharge: 2023-07-18 | Disposition: A | Discharge status: Home / Self Care | Payer: 59 | Source: Ambulatory Visit | Attending: Radiation Oncology | Admitting: Radiation Oncology

## 2023-07-18 ENCOUNTER — Inpatient Hospital Stay (HOSPITAL_BASED_OUTPATIENT_CLINIC_OR_DEPARTMENT_OTHER): Payer: 59 | Admitting: Oncology

## 2023-07-18 VITALS — HR 100

## 2023-07-18 DIAGNOSIS — Z5112 Encounter for antineoplastic immunotherapy: Secondary | ICD-10-CM | POA: Diagnosis not present

## 2023-07-18 DIAGNOSIS — C801 Malignant (primary) neoplasm, unspecified: Secondary | ICD-10-CM

## 2023-07-18 LAB — CBC WITH DIFFERENTIAL (CANCER CENTER ONLY)
Abs Immature Granulocytes: 0.35 10*3/uL — ABNORMAL HIGH (ref 0.00–0.07)
Basophils Absolute: 0 10*3/uL (ref 0.0–0.1)
Basophils Relative: 1 %
Eosinophils Absolute: 0.4 10*3/uL (ref 0.0–0.5)
Eosinophils Relative: 5 %
HCT: 28.4 % — ABNORMAL LOW (ref 36.0–46.0)
Hemoglobin: 9.3 g/dL — ABNORMAL LOW (ref 12.0–15.0)
Immature Granulocytes: 5 %
Lymphocytes Relative: 14 %
Lymphs Abs: 1 10*3/uL (ref 0.7–4.0)
MCH: 31.4 pg (ref 26.0–34.0)
MCHC: 32.7 g/dL (ref 30.0–36.0)
MCV: 95.9 fL (ref 80.0–100.0)
Monocytes Absolute: 0.6 10*3/uL (ref 0.1–1.0)
Monocytes Relative: 8 %
Neutro Abs: 5.1 10*3/uL (ref 1.7–7.7)
Neutrophils Relative %: 67 %
Platelet Count: 101 10*3/uL — ABNORMAL LOW (ref 150–400)
RBC: 2.96 MIL/uL — ABNORMAL LOW (ref 3.87–5.11)
RDW: 16 % — ABNORMAL HIGH (ref 11.5–15.5)
WBC Count: 7.5 10*3/uL (ref 4.0–10.5)
nRBC: 1.3 % — ABNORMAL HIGH (ref 0.0–0.2)

## 2023-07-18 LAB — CMP (CANCER CENTER ONLY)
ALT: 113 U/L — ABNORMAL HIGH (ref 0–44)
AST: 106 U/L — ABNORMAL HIGH (ref 15–41)
Albumin: 3.8 g/dL (ref 3.5–5.0)
Alkaline Phosphatase: 389 U/L — ABNORMAL HIGH (ref 38–126)
Anion gap: 7 (ref 5–15)
BUN: 16 mg/dL (ref 6–20)
CO2: 19 mmol/L — ABNORMAL LOW (ref 22–32)
Calcium: 8.8 mg/dL — ABNORMAL LOW (ref 8.9–10.3)
Chloride: 103 mmol/L (ref 98–111)
Creatinine: 0.65 mg/dL (ref 0.44–1.00)
GFR, Estimated: >60 mL/min (ref >60)
Glucose, Bld: 159 mg/dL — ABNORMAL HIGH (ref 70–99)
Potassium: 3.7 mmol/L (ref 3.5–5.1)
Sodium: 129 mmol/L — ABNORMAL LOW (ref 135–145)
Total Bilirubin: 0.5 mg/dL (ref 0.3–1.2)
Total Protein: 7.4 g/dL (ref 6.5–8.1)

## 2023-07-18 LAB — RAD ONC ARIA SESSION SUMMARY
Course Elapsed Days: 12
Plan Fractions Treated to Date: 9
Plan Prescribed Dose Per Fraction: 3 Gy
Plan Total Fractions Prescribed: 10
Plan Total Prescribed Dose: 30 Gy
Reference Point Dosage Given to Date: 27 Gy
Reference Point Session Dosage Given: 3 Gy
Session Number: 9

## 2023-07-18 MED ORDER — SODIUM CHLORIDE 0.9 % IV SOLN
200.0000 mg | Freq: Once | INTRAVENOUS | Status: AC
  Administered 2023-07-18: 200 mg via INTRAVENOUS
  Filled 2023-07-18: qty 200

## 2023-07-18 MED ORDER — SODIUM CHLORIDE 0.9 % IV SOLN
10.0000 mg | Freq: Once | INTRAVENOUS | Status: AC
  Administered 2023-07-18: 10 mg via INTRAVENOUS
  Filled 2023-07-18: qty 10

## 2023-07-18 MED ORDER — DEXTROSE 5 % IV SOLN
Freq: Once | INTRAVENOUS | Status: AC
  Filled 2023-07-18: qty 250

## 2023-07-18 MED ORDER — PALONOSETRON HCL INJECTION 0.25 MG/5ML
0.2500 mg | Freq: Once | INTRAVENOUS | Status: AC
  Administered 2023-07-18: 0.25 mg via INTRAVENOUS
  Filled 2023-07-18: qty 5

## 2023-07-18 MED ORDER — OXALIPLATIN CHEMO INJECTION 100 MG/20ML
85.0000 mg/m2 | Freq: Once | INTRAVENOUS | Status: AC
  Administered 2023-07-18: 150 mg via INTRAVENOUS
  Filled 2023-07-18: qty 10

## 2023-07-18 MED ORDER — LEUCOVORIN CALCIUM INJECTION 350 MG
400.0000 mg/m2 | Freq: Once | INTRAVENOUS | Status: AC
  Administered 2023-07-18: 748 mg via INTRAVENOUS
  Filled 2023-07-18: qty 37.4

## 2023-07-18 MED ORDER — FLUOROURACIL CHEMO INJECTION 2.5 GM/50ML
400.0000 mg/m2 | Freq: Once | INTRAVENOUS | Status: AC
  Administered 2023-07-18: 750 mg via INTRAVENOUS
  Filled 2023-07-18: qty 15

## 2023-07-18 MED ORDER — SODIUM CHLORIDE 0.9 % IV SOLN
2400.0000 mg/m2 | INTRAVENOUS | Status: DC
  Administered 2023-07-18: 4500 mg via INTRAVENOUS
  Filled 2023-07-18: qty 90

## 2023-07-18 NOTE — Progress Notes (Signed)
OK to proceed w/ Pembrolizumab w/ today's LFT's per Dr. Orlie Dakin.  Ebony Hail, Pharm.D., CPP 07/18/2023@11 :03 AM

## 2023-07-18 NOTE — Progress Notes (Addendum)
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  INTERVAL HISTORY: Patient returns to clinic today for further evaluation, discussion of her PET scan results, and consideration of cycle 7 of FOLFOX plus Keytruda.  She continues to have chronic back and leg pain and is currently receiving palliative XRT.  She is tolerating her treatments well without significant side effects.  She has a fair appetite.  She has no neurologic complaints.  She denies any recent fevers or illnesses.  She has no chest pain, shortness of breath, cough, or hemoptysis.  She denies any nausea, vomiting, constipation, or diarrhea.  She has no urinary complaints.  Patient offers no further specific complaints today.  REVIEW OF SYSTEMS:   Review of Systems  Constitutional:  Positive for malaise/fatigue. Negative for fever and weight loss.  Respiratory: Negative.  Negative for cough, hemoptysis and shortness of breath.   Cardiovascular: Negative.  Negative for chest pain and leg swelling.  Gastrointestinal: Negative.  Negative for abdominal pain.  Genitourinary: Negative.  Negative for dysuria and frequency.  Musculoskeletal:  Positive for back pain.  Skin: Negative.  Negative for rash.  Neurological: Negative.  Negative for dizziness, focal weakness, weakness and headaches.  Psychiatric/Behavioral: Negative.  The patient is not nervous/anxious.     As per HPI. Otherwise, a complete review of systems is negative.  PAST MEDICAL HISTORY: Past Medical History:  Diagnosis  Date   Anxiety    Cancer (HCC)    Depression    Heart murmur    Hypertension     PAST SURGICAL HISTORY: Past Surgical History:  Procedure Laterality Date   CESAREAN SECTION     COLONOSCOPY WITH PROPOFOL N/A 02/21/2023   Procedure: COLONOSCOPY WITH PROPOFOL;  Surgeon: Regis Bill, MD;  Location: [facility] ENDOSCOPY;  Service: Endoscopy;  Laterality: N/A;   DILATION AND CURETTAGE OF UTERUS     IR IMAGING GUIDED PORT INSERTION  02/16/2023    FAMILY HISTORY: Family History  Problem Relation Age of Onset   Cancer Maternal Grandmother     ADVANCED DIRECTIVES (Y/N):  N  HEALTH MAINTENANCE: Social History   Tobacco Use   Smoking status: Former    Current packs/day: 0.00    Types: Cigarettes    Quit date: 12/19/2022    Years since quitting: 0.5   Smokeless tobacco: Never   Tobacco comments:    Quit smoking 12/2022  Vaping Use   Vaping status: Never Used  Substance Use Topics   Alcohol use: Yes    Comment: occ   Drug use: No     Colonoscopy:  PAP:  Bone density:  Lipid panel:  Allergies  Allergen Reactions   Nifedipine Palpitations    Current Outpatient Medications  Medication Sig Dispense Refill   cilostazol (PLETAL) 100 MG tablet Take 100 mg by mouth 2 (two) times daily.     cloNIDine (CATAPRES) 0.1 MG tablet Take 1 tablet (0.1 mg total) by mouth daily as needed (Sustained Systolic pressure of 200 or higher). 20 tablet 0   cyanocobalamin (VITAMIN B12) 500 MCG tablet Take 500 mcg by mouth daily.     DULoxetine (CYMBALTA)  30 MG capsule Take 30 mg by mouth daily.     gabapentin (NEURONTIN) 300 MG capsule Take 300 mg by mouth 3 (three) times daily.     hydrocortisone 2.5 % cream Apply 1 Application topically 2 (two) times daily.     lidocaine-prilocaine (EMLA) cream Apply to affected area once 30 g 3   megestrol (MEGACE) 40 MG tablet TAKE 1 TABLET BY MOUTH EVERY DAY 30 tablet 2   Multiple Vitamin (MULTIVITAMIN) tablet Take 1 tablet by mouth daily.     naloxone  (NARCAN) nasal spray 4 mg/0.1 mL Place 1 spray into the nose once.     omeprazole (PRILOSEC) 20 MG capsule Take 20 mg by mouth daily.     ondansetron (ZOFRAN) 8 MG tablet Take 1 tablet (8 mg total) by mouth every 8 (eight) hours as needed for nausea or vomiting. Start on the third day after chemotherapy. 60 tablet 2   prochlorperazine (COMPAZINE) 10 MG tablet Take 1 tablet (10 mg total) by mouth every 6 (six) hours as needed for nausea or vomiting. 60 tablet 2   tiZANidine (ZANAFLEX) 2 MG tablet Take 2 mg by mouth at bedtime.     traMADol (ULTRAM-ER) 100 MG 24 hr tablet Take 100 mg by mouth daily.     Vitamin D, Ergocalciferol, (DRISDOL) 1.25 MG (50000 UNIT) CAPS capsule Take 50,000 Units by mouth every 7 (seven) days.     albuterol (PROVENTIL) (2.5 MG/3ML) 0.083% nebulizer solution Take 2.5 mg by nebulization every 6 (six) hours as needed for wheezing or shortness of breath. (Patient not taking: Reported on 06/13/2023)     atenolol-chlorthalidone (TENORETIC) 50-25 MG tablet Take 1 tablet by mouth daily. (Patient not taking: Reported on 06/13/2023)     Buprenorphine HCl-Naloxone HCl 4-1 MG FILM Place 4 mg of opioid under the tongue every 12 (twelve) hours. (Patient not taking: Reported on 06/13/2023)     cetirizine (ZYRTEC ALLERGY) 10 MG tablet Take 1 tablet (10 mg total) by mouth daily. (Patient not taking: Reported on 06/13/2023) 30 tablet 0   ondansetron (ZOFRAN-ODT) 4 MG disintegrating tablet Take 4 mg by mouth 2 (two) times daily as needed for nausea or vomiting. (Patient not taking: Reported on 06/13/2023)     oxyCODONE-acetaminophen (PERCOCET/ROXICET) 5-325 MG tablet Take 1 tablet by mouth 2 (two) times daily. (Patient not taking: Reported on 04/04/2023)     polyethylene glycol (MIRALAX / GLYCOLAX) 17 g packet Take 17 g by mouth daily. (Patient not taking: Reported on 06/13/2023)     SENEXON-S 8.6-50 MG tablet Take 1 tablet by mouth 2 (two) times daily. (Patient not taking: Reported on 06/13/2023)      No current facility-administered medications for this visit.   Facility-Administered Medications Ordered in Other Visits  Medication Dose Route Frequency Provider Last Rate Last Admin   sodium chloride flush (NS) 0.9 % injection 10 mL  10 mL Intracatheter PRN Jeralyn Ruths, MD   10 mL at 06/29/23 1430    OBJECTIVE: Vitals:   07/18/23 0900  BP: 136/87  Pulse: (!) 104  Temp: (!) 96.3 F (35.7 C)  SpO2: 100%      Body mass index is 34 kg/m.    ECOG FS:1 - Symptomatic but completely ambulatory  General: Well-developed, well-nourished, no acute distress.  Sitting in a wheelchair. Eyes: Pink conjunctiva, anicteric sclera. HEENT: Normocephalic, moist mucous membranes. Lungs: No audible wheezing or coughing. Heart: Regular rate and rhythm. Abdomen: Soft, nontender, no obvious distention. Musculoskeletal: No edema, cyanosis, or clubbing.  Neuro: Alert, answering all questions appropriately. Cranial nerves grossly intact. Skin: No rashes or petechiae noted. Psych: Normal affect.  LAB RESULTS:  Lab Results  Component Value Date   NA 129 (L) 07/18/2023   K 3.7 07/18/2023   CL 103 07/18/2023   CO2 19 (L) 07/18/2023   GLUCOSE 159 (H) 07/18/2023   BUN 16 07/18/2023   CREATININE 0.65 07/18/2023   CALCIUM 8.8 (L) 07/18/2023   PROT 7.4 07/18/2023   ALBUMIN 3.8 07/18/2023   AST 106 (H) 07/18/2023   ALT 113 (H) 07/18/2023   ALKPHOS 389 (H) 07/18/2023   BILITOT 0.5 07/18/2023   GFRNONAA >60 07/18/2023   GFRAA >60 09/27/2018    Lab Results  Component Value Date   WBC 7.5 07/18/2023   NEUTROABS 5.1 07/18/2023   HGB 9.3 (L) 07/18/2023   HCT 28.4 (L) 07/18/2023   MCV 95.9 07/18/2023   PLT 101 (L) 07/18/2023     STUDIES: NM PET Image Restag (PS) Skull Base To Thigh  Result Date: 07/15/2023 CLINICAL DATA:  Subsequent treatment strategy for adrenal carcinoma. EXAM: NUCLEAR MEDICINE PET SKULL BASE TO THIGH TECHNIQUE: 9.6 mCi F-18 FDG was injected intravenously. Full-ring  PET imaging was performed from the skull base to thigh after the radiotracer. CT data was obtained and used for attenuation correction and anatomic localization. Fasting blood glucose: 93 mg/dl COMPARISON:  08/65/7846. FINDINGS: Mediastinal blood pool activity: SUV max 2.3 Liver activity: SUV max NA NECK: Worsening cervical hypermetabolic adenopathy. Index posterior left level II/III lymph node measures 8 mm (7/25), SUV max 6.1, previously 4 mm and 2.2. Incidental CT findings: None. CHEST: Hypermetabolic mediastinal and axillary lymph nodes, progressive. Index left axillary lymph node measures 13 mm (7/47), SUV max 7.9, compared to 10 mm and 6.8. Incidental CT findings: Right IJ Port-A-Cath terminates in the right atrium. Heart is enlarged. No pericardial or pleural effusion. ABDOMEN/PELVIS: Similar hypermetabolic abdominal retroperitoneal lymph nodes. Index aortocaval lymph node measures 8 mm (7/96), SUV max 4.5. Progressive pelvic retroperitoneal lymph nodes. Index left external iliac lymph node measures 12 mm (7/134), SUV max 8.2, previously 10 mm and 2.4. Additional small hypermetabolic inguinal lymph nodes. Enlarging left adnexal mass now measures 4.5 x 5.5 cm (7/128), SUV max similar at 3.3, previously 4.4 x 4.7 cm. Incidental CT findings: Liver is grossly unremarkable. Gallstones. Adrenal glands, kidneys, spleen, pancreas, stomach and bowel are grossly unremarkable. SKELETON: Mottled sclerosis and hypermetabolism throughout the visualized osseous structures, similar to the prior exam. Index hypermetabolism in the proximal right femur, SUV max 8.3, compared to 9.3. Incidental CT findings: None. IMPRESSION: 1. Slight interval progression in metastatic disease as evidenced by enlarging and/or increasing hypermetabolic lymph nodes in the neck, chest and pelvis. Abdominal retroperitoneal lymph nodes and osseous metastatic disease appear similar. 2. Enlarging and mildly hypermetabolic left adnexal mass is worrisome  for a metastasis. Previous ultrasound 02/24/2023. 3. Cholelithiasis. Electronically Signed   By: Leanna Battles M.D.   On: 07/15/2023 13:06    ONCOLOGY: Patient's workup was initiated at [facility] in February 2024 at which point PET scan revealed widely metastatic disease with innumerable sclerotic bony lesions as well as axillary, mediastinal, and abdominal lymphadenopathy.  Subsequent biopsy of a right axillary lymph node revealed signet ring adenocarcinoma, likely GI origin.  Additional testing revealed high tumor mutational burden.  Further workup at [facility] in April 2024 revealed a negative EGD, and negative CT scanning of the brain.  CT scan of the abdomen and follow-up pelvic ultrasound revealed a  large left adnexal mass that was not commented on PET scan from February.  CA 19-9 is normal, but CA125 and CEA are mildly elevated.    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  Repeat PET scan on February 24, 2023 reviewed independently confirming widespread metastatic disease.  Left adnexal mass is now PET positive of unclear clinical significance and after evaluation by gyn-onc, ultimately would not change the overall treatment plan.  Repeat PET scan on July 06, 2023 reviewed independently and reported as above with stable to mildly progressive disease, but not significant enough to alter the treatment plan.  Patient is receiving FOLFOX plus Keytruda every 3 weeks for 12 cycles followed by maintenance Keytruda for at least 2 years.  Patient also received Zometa on even number cycles.  She has had had port placed.  Proceed with cycle 7 of treatment today.  Return to clinic in 2 days for pump removal and fulphilia.  Patient will then return to clinic in 3 weeks for further evaluation and consideration of cycle 7.  Will reimage with PET scan in  December 2024.   Adnexal mass: Repeat PET scan as above.  Awaiting pathology from outside biopsy for further evaluation.  Appreciate gyn-onc input. Pain: Chronic and unchanged.  Continue follow-up with her physician in the pain clinic in [place], [place].  Continue with XRT completing on July 19, 2023. Bony disease: Continue Zometa on even number cycles as above. Transaminitis: AST and ALT elevated today.  Proceed with treatment as planned.  Neither oxaliplatin nor 5-FU need to be dose reduced. Anemia: Hemoglobin is trended down slightly to 9.3.  Monitor.   Neutropenia: Resolved.  Continue Fulphilia with pump removal as above. Thrombocytopenia: Patient's platelet count 101 today.  Proceed with treatment as planned. Weight loss: Resolved.  Continue Megace as prescribed. Hypertension: Well-controlled, monitor.   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 Patient is sexually active, menstruating, and not on birth control however. She verbalizes consent to proceed with treatment after previous discussion of potential life threatening or damaging effects to embryo. Megace not likely to cause ovarian suppression.  Patient also previously declined pelvic ultrasound to evaluate for pregnancy.  Patient previously discussed with nurse practitioner birth control including abstinence, condoms, OCPs, Depo-Lupron, vs IUD. Discussed pros and cons of each. She will think about these and let us know how she would like to proceed.  Coping/anxiety: Patient was given a referral to [facility].  Patient expressed understanding and was in agreement with this plan. She also understands that She can call clinic at any time with any questions, concerns, or complaints.    Cancer Staging  Signet ring cell adenocarcinoma [facility]) Staging form: Exocrine Pancreas, AJCC 8th Edition - Clinical stage from 02/07/2023: Stage IV (cTX, cNX, pM1) - Signed by Jeralyn Ruths, MD on 02/14/2023 Stage  prefix: Initial diagnosis   Jeralyn Ruths, MD   07/18/2023 10:10 AM

## 2023-07-19 ENCOUNTER — Other Ambulatory Visit: Payer: Self-pay

## 2023-07-19 ENCOUNTER — Inpatient Hospital Stay: Payer: 59

## 2023-07-19 ENCOUNTER — Ambulatory Visit
Admission: RE | Admit: 2023-07-19 | Discharge: 2023-07-19 | Disposition: A | Discharge status: Home / Self Care | Payer: 59 | Source: Ambulatory Visit | Attending: Radiation Oncology | Admitting: Radiation Oncology

## 2023-07-19 DIAGNOSIS — C801 Malignant (primary) neoplasm, unspecified: Secondary | ICD-10-CM

## 2023-07-19 DIAGNOSIS — Z5112 Encounter for antineoplastic immunotherapy: Secondary | ICD-10-CM | POA: Diagnosis not present

## 2023-07-19 LAB — RAD ONC ARIA SESSION SUMMARY
Course Elapsed Days: 13
Plan Fractions Treated to Date: 10
Plan Prescribed Dose Per Fraction: 3 Gy
Plan Total Fractions Prescribed: 10
Plan Total Prescribed Dose: 30 Gy
Reference Point Dosage Given to Date: 30 Gy
Reference Point Session Dosage Given: 3 Gy
Session Number: 10

## 2023-07-19 LAB — CBC (CANCER CENTER ONLY)
HCT: 28.9 % — ABNORMAL LOW (ref 36.0–46.0)
Hemoglobin: 9.4 g/dL — ABNORMAL LOW (ref 12.0–15.0)
MCH: 31.3 pg (ref 26.0–34.0)
MCHC: 32.5 g/dL (ref 30.0–36.0)
MCV: 96.3 fL (ref 80.0–100.0)
Platelet Count: 97 10*3/uL — ABNORMAL LOW (ref 150–400)
RBC: 3 MIL/uL — ABNORMAL LOW (ref 3.87–5.11)
RDW: 16.3 % — ABNORMAL HIGH (ref 11.5–15.5)
WBC Count: 6.5 10*3/uL (ref 4.0–10.5)
nRBC: 0.3 % — ABNORMAL HIGH (ref 0.0–0.2)

## 2023-07-20 ENCOUNTER — Inpatient Hospital Stay (HOSPITAL_BASED_OUTPATIENT_CLINIC_OR_DEPARTMENT_OTHER): Payer: 59 | Admitting: Medical Oncology

## 2023-07-20 ENCOUNTER — Other Ambulatory Visit: Payer: Self-pay

## 2023-07-20 ENCOUNTER — Inpatient Hospital Stay: Payer: 59

## 2023-07-20 VITALS — BP 138/87 | HR 110 | Temp 95.3°F | Resp 19

## 2023-07-20 VITALS — BP 138/87 | HR 120 | Temp 95.3°F | Resp 19

## 2023-07-20 DIAGNOSIS — E860 Dehydration: Secondary | ICD-10-CM

## 2023-07-20 DIAGNOSIS — C801 Malignant (primary) neoplasm, unspecified: Secondary | ICD-10-CM

## 2023-07-20 DIAGNOSIS — E871 Hypo-osmolality and hyponatremia: Secondary | ICD-10-CM | POA: Diagnosis not present

## 2023-07-20 DIAGNOSIS — Z5112 Encounter for antineoplastic immunotherapy: Secondary | ICD-10-CM | POA: Diagnosis not present

## 2023-07-20 LAB — CBC WITH DIFFERENTIAL/PLATELET
Abs Immature Granulocytes: 0.09 10*3/uL — ABNORMAL HIGH (ref 0.00–0.07)
Basophils Absolute: 0 10*3/uL (ref 0.0–0.1)
Basophils Relative: 0 %
Eosinophils Absolute: 0.1 10*3/uL (ref 0.0–0.5)
Eosinophils Relative: 2 %
HCT: 29.4 % — ABNORMAL LOW (ref 36.0–46.0)
Hemoglobin: 9.9 g/dL — ABNORMAL LOW (ref 12.0–15.0)
Immature Granulocytes: 2 %
Lymphocytes Relative: 6 %
Lymphs Abs: 0.3 10*3/uL — ABNORMAL LOW (ref 0.7–4.0)
MCH: 31.5 pg (ref 26.0–34.0)
MCHC: 33.7 g/dL (ref 30.0–36.0)
MCV: 93.6 fL (ref 80.0–100.0)
Monocytes Absolute: 0.2 10*3/uL (ref 0.1–1.0)
Monocytes Relative: 3 %
Neutro Abs: 4.7 10*3/uL (ref 1.7–7.7)
Neutrophils Relative %: 87 %
Platelets: 84 10*3/uL — ABNORMAL LOW (ref 150–400)
RBC: 3.14 MIL/uL — ABNORMAL LOW (ref 3.87–5.11)
RDW: 15.9 % — ABNORMAL HIGH (ref 11.5–15.5)
WBC: 5.4 10*3/uL (ref 4.0–10.5)
nRBC: 0.4 % — ABNORMAL HIGH (ref 0.0–0.2)

## 2023-07-20 LAB — COMPREHENSIVE METABOLIC PANEL WITH GFR
ALT: 105 U/L — ABNORMAL HIGH (ref 0–44)
AST: 81 U/L — ABNORMAL HIGH (ref 15–41)
Albumin: 3.7 g/dL (ref 3.5–5.0)
Alkaline Phosphatase: 367 U/L — ABNORMAL HIGH (ref 38–126)
Anion gap: 6 (ref 5–15)
BUN: 24 mg/dL — ABNORMAL HIGH (ref 6–20)
CO2: 20 mmol/L — ABNORMAL LOW (ref 22–32)
Calcium: 8.6 mg/dL — ABNORMAL LOW (ref 8.9–10.3)
Chloride: 101 mmol/L (ref 98–111)
Creatinine, Ser: 0.53 mg/dL (ref 0.44–1.00)
GFR, Estimated: >60 mL/min (ref >60)
Glucose, Bld: 142 mg/dL — ABNORMAL HIGH (ref 70–99)
Potassium: 3.4 mmol/L — ABNORMAL LOW (ref 3.5–5.1)
Sodium: 127 mmol/L — ABNORMAL LOW (ref 135–145)
Total Bilirubin: 0.7 mg/dL (ref 0.3–1.2)
Total Protein: 7 g/dL (ref 6.5–8.1)

## 2023-07-20 LAB — MAGNESIUM: Magnesium: 2 mg/dL (ref 1.7–2.4)

## 2023-07-20 LAB — SAMPLE TO BLOOD BANK

## 2023-07-20 MED ORDER — PEGFILGRASTIM-JMDB 6 MG/0.6ML ~~LOC~~ SOSY
6.0000 mg | PREFILLED_SYRINGE | Freq: Once | SUBCUTANEOUS | Status: AC
  Administered 2023-07-20: 6 mg via SUBCUTANEOUS
  Filled 2023-07-20: qty 0.6

## 2023-07-20 MED ORDER — HEPARIN SOD (PORK) LOCK FLUSH 100 UNIT/ML IV SOLN
500.0000 [IU] | Freq: Once | INTRAVENOUS | Status: AC | PRN
  Administered 2023-07-20: 500 [IU]
  Filled 2023-07-20: qty 5

## 2023-07-20 MED ORDER — SODIUM CHLORIDE 0.9 % IV SOLN
Freq: Once | INTRAVENOUS | Status: AC
  Filled 2023-07-20: qty 250

## 2023-07-20 NOTE — Progress Notes (Signed)
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   Current Treatment: FOLFOX + Keytruda s/p cycle 7  Date of visit: 07/20/23  Reason for Consult: Sheryl Silva is a 47 y.o. female who presents today for:  Fatigue: Patient presented with her daughter for pump d/c today. She mentioned feeling lethargic and was added on for a visit with our [facility] team.   She reports that after every chemotherapy she feels weak, fatigues and has no appetite. These symptoms get worse after each chemotherapy treatment. She subsequently has not eaten or had much to drink since her last treatment on 07/18/2023. She experienced left hip bone pain yesterday but this has fully resolved.    Denies any neurologic complaints. Denies recent fevers or illnesses. Denies any easy bleeding or bruising. Denies chest pain. Denies any nausea, vomiting, constipation, or diarrhea. Denies urinary complaints. Patient offers no further specific complaints today.  Wt Readings from Last 3 Encounters:  07/18/23 174 lb 1.6 oz (79 kg)  07/06/23 171 lb 12.8 oz (77.9 kg)  06/27/23 176 lb 6.4 oz (80 kg)     PAST MEDICAL HISTORY: Past Medical History:  Diagnosis Date   Anxiety    Cancer (HCC)    Depression    Heart murmur    Hypertension     PAST SURGICAL HISTORY:  Past Surgical History:  Procedure Laterality Date   CESAREAN SECTION     COLONOSCOPY WITH PROPOFOL N/A 02/21/2023   Procedure: COLONOSCOPY WITH PROPOFOL;  Surgeon:  Regis Bill, MD;  Location: [facility] ENDOSCOPY;  Service: Endoscopy;  Laterality: N/A;   DILATION AND CURETTAGE OF UTERUS     IR IMAGING GUIDED PORT INSERTION  02/16/2023    HEMATOLOGY/ONCOLOGY HISTORY:  Oncology History  Signet ring cell adenocarcinoma (HCC)  02/07/2023 Cancer Staging   Staging form: Exocrine Pancreas, AJCC 8th Edition - Clinical stage from 02/07/2023: Stage IV (cTX, cNX, pM1) - Signed by Jeralyn Ruths, MD on 02/14/2023 Stage prefix: Initial diagnosis   02/14/2023 Initial Diagnosis   Signet ring cell adenocarcinoma   03/02/2023 -  Chemotherapy   Patient is on Treatment Plan : GI ORIGIN FOLFOX+KEYTRUDA q21d x12 followed by Gwenlyn Fudge       ALLERGIES:  is allergic to nifedipine.  MEDICATIONS:  Current Outpatient Medications  Medication Sig Dispense Refill   albuterol (PROVENTIL) (2.5 MG/3ML) 0.083% nebulizer solution Take 2.5 mg by nebulization every 6 (six) hours as needed for wheezing or shortness of breath. (Patient not taking: Reported on 06/13/2023)     atenolol-chlorthalidone (TENORETIC) 50-25 MG tablet Take 1 tablet by mouth daily. (Patient not taking: Reported on 06/13/2023)     Buprenorphine HCl-Naloxone HCl 4-1 MG FILM Place 4 mg of opioid under the tongue every 12 (twelve) hours. (Patient not taking: Reported on 06/13/2023)     cetirizine (ZYRTEC ALLERGY) 10 MG tablet Take 1 tablet (10 mg total) by mouth daily. (Patient not taking: Reported on 06/13/2023) 30 tablet 0   cilostazol (PLETAL) 100 MG tablet Take 100 mg by mouth 2 (two) times daily.     cloNIDine (CATAPRES)  0.1 MG tablet Take 1 tablet (0.1 mg total) by mouth daily as needed (Sustained Systolic pressure of 200 or higher). 20 tablet 0   cyanocobalamin (VITAMIN B12) 500 MCG tablet Take 500 mcg by mouth daily.     DULoxetine (CYMBALTA) 30 MG capsule Take 30 mg by mouth daily.     gabapentin (NEURONTIN) 300 MG capsule Take 300 mg by mouth 3 (three) times daily.     hydrocortisone 2.5 % cream  Apply 1 Application topically 2 (two) times daily.     lidocaine-prilocaine (EMLA) cream Apply to affected area once 30 g 3   megestrol (MEGACE) 40 MG tablet TAKE 1 TABLET BY MOUTH EVERY DAY 30 tablet 2   Multiple Vitamin (MULTIVITAMIN) tablet Take 1 tablet by mouth daily.     naloxone (NARCAN) nasal spray 4 mg/0.1 mL Place 1 spray into the nose once.     omeprazole (PRILOSEC) 20 MG capsule Take 20 mg by mouth daily.     ondansetron (ZOFRAN) 8 MG tablet Take 1 tablet (8 mg total) by mouth every 8 (eight) hours as needed for nausea or vomiting. Start on the third day after chemotherapy. 60 tablet 2   ondansetron (ZOFRAN-ODT) 4 MG disintegrating tablet Take 4 mg by mouth 2 (two) times daily as needed for nausea or vomiting. (Patient not taking: Reported on 06/13/2023)     oxyCODONE-acetaminophen (PERCOCET/ROXICET) 5-325 MG tablet Take 1 tablet by mouth 2 (two) times daily. (Patient not taking: Reported on 04/04/2023)     polyethylene glycol (MIRALAX / GLYCOLAX) 17 g packet Take 17 g by mouth daily. (Patient not taking: Reported on 06/13/2023)     prochlorperazine (COMPAZINE) 10 MG tablet Take 1 tablet (10 mg total) by mouth every 6 (six) hours as needed for nausea or vomiting. 60 tablet 2   SENEXON-S 8.6-50 MG tablet Take 1 tablet by mouth 2 (two) times daily. (Patient not taking: Reported on 06/13/2023)     tiZANidine (ZANAFLEX) 2 MG tablet Take 2 mg by mouth at bedtime.     traMADol (ULTRAM-ER) 100 MG 24 hr tablet Take 100 mg by mouth daily.     Vitamin D, Ergocalciferol, (DRISDOL) 1.25 MG (50000 UNIT) CAPS capsule Take 50,000 Units by mouth every 7 (seven) days.     No current facility-administered medications for this visit.   Facility-Administered Medications Ordered in Other Visits  Medication Dose Route Frequency Provider Last Rate Last Admin   heparin lock flush 100 unit/mL  500 Units Intracatheter Once PRN Jeralyn Ruths, MD       sodium chloride flush (NS) 0.9 % injection 10 mL  10 mL  Intracatheter PRN Jeralyn Ruths, MD   10 mL at 06/29/23 1430    VITAL SIGNS: BP 138/87   Temp (!) 95.3 F (35.2 C)   Resp 19   SpO2 100%  There were no vitals filed for this visit.  Estimated body mass index is 34 kg/m as calculated from the following:   Height as of 06/16/23: 5' (1.524 m).   Weight as of 07/18/23: 174 lb 1.6 oz (79 kg).  LABS: CBC:    Component Value Date/Time   WBC 5.4 07/20/2023 1515   HGB 9.9 (L) 07/20/2023 1515   HGB 9.4 (L) 07/19/2023 1432   HCT 29.4 (L) 07/20/2023 1515   PLT 84 (L) 07/20/2023 1515   PLT 97 (L) 07/19/2023 1432   MCV 93.6 07/20/2023 1515   NEUTROABS 4.7 07/20/2023 1515   LYMPHSABS 0.3 (L) 07/20/2023 1515  MONOABS 0.2 07/20/2023 1515   EOSABS 0.1 07/20/2023 1515   BASOSABS 0.0 07/20/2023 1515   Comprehensive Metabolic Panel:    Component Value Date/Time   NA 127 (L) 07/20/2023 1515   K 3.4 (L) 07/20/2023 1515   CL 101 07/20/2023 1515   CO2 20 (L) 07/20/2023 1515   BUN 24 (H) 07/20/2023 1515   CREATININE 0.53 07/20/2023 1515   CREATININE 0.65 07/18/2023 0829   GLUCOSE 142 (H) 07/20/2023 1515   CALCIUM 8.6 (L) 07/20/2023 1515   AST 81 (H) 07/20/2023 1515   AST 106 (H) 07/18/2023 0829   ALT 105 (H) 07/20/2023 1515   ALT 113 (H) 07/18/2023 0829   ALKPHOS 367 (H) 07/20/2023 1515   BILITOT 0.7 07/20/2023 1515   BILITOT 0.5 07/18/2023 0829   PROT 7.0 07/20/2023 1515   ALBUMIN 3.7 07/20/2023 1515    PERFORMANCE STATUS (ECOG) : 2 - Symptomatic, <50% confined to bed  Review of Systems Unless otherwise noted, a complete review of systems is negative.  Physical Exam General: NAD. Sitting in infusion chair  Cardiovascular: regular rate and rhythm, no peripheral edema Pulmonary: clear ant fields Abdomen: soft, nontender, + bowel sounds GU: no suprapubic tenderness Extremities: no edema, no joint deformities Skin: no rashes Neurological: Weakness but otherwise nonfocal  Assessment and Plan- Patient is a 47 y.o. female     Encounter Diagnoses  Name Primary?   Dehydration Yes   Signet ring cell adenocarcinoma (HCC)    Hyponatremia    Today we will offer supportive care with 1L IVF. Feeling much better s/p IVF. Pulse normalizing with IVF. She will try to eat and drink as tolerable. We will see her for follow up on Friday and she will discuss potential dose reduction with Dr. Orlie Dakin at her next follow up.   Patient expressed understanding and was in agreement with this plan. She also understands that She can call clinic at any time with any questions, concerns, or complaints.   Disposition 1L IVF today RTC Friday APP, labs (CBC w/, CMP) +_ 1L IVF+_ 20 MEQ K-   Thank you for allowing me to participate in the care of this very pleasant patient.   Time Total: 25  Visit consisted of counseling and education dealing with the complex and emotionally intense issues of symptom management in the setting of serious illness.Greater than 50%  of this time was spent counseling and coordinating care related to the above assessment and plan.  Signed by: Clent Jacks, PA-C

## 2023-07-20 NOTE — Progress Notes (Signed)
Patient arrived for pump stop diaphoretic and "not feeling well". Pt stated she had severe left side pain the past two days that had resolved, however today she feels extremely fatigued and is having generalized aching. She stated she had not been eating or drinking and was unable to sleep due to overall discomfort since pump was started on Monday. Pt afebrile and absent of cough and nausea. All of this reported to Orlie Dakin, MD and team. Symptom management consulted and after labs ordered .1 L NS over an hour ordered after reviewing labs. Will proceed with fulphila injection halfway through NS bolus per [place], [place].

## 2023-07-22 ENCOUNTER — Other Ambulatory Visit: Payer: Self-pay

## 2023-07-22 ENCOUNTER — Emergency Department: Payer: 59

## 2023-07-22 ENCOUNTER — Encounter: Payer: Self-pay | Admitting: Internal Medicine

## 2023-07-22 ENCOUNTER — Inpatient Hospital Stay
Admission: EM | Admit: 2023-07-22 | Discharge: 2023-08-04 | DRG: 871 | Disposition: A | Discharge status: Home / Self Care | Payer: 59 | Attending: Internal Medicine | Admitting: Internal Medicine

## 2023-07-22 DIAGNOSIS — C7951 Secondary malignant neoplasm of bone: Secondary | ICD-10-CM | POA: Diagnosis present

## 2023-07-22 DIAGNOSIS — R748 Abnormal levels of other serum enzymes: Secondary | ICD-10-CM | POA: Diagnosis present

## 2023-07-22 DIAGNOSIS — K5732 Diverticulitis of large intestine without perforation or abscess without bleeding: Secondary | ICD-10-CM | POA: Diagnosis present

## 2023-07-22 DIAGNOSIS — I3139 Other pericardial effusion (noninflammatory): Secondary | ICD-10-CM | POA: Diagnosis present

## 2023-07-22 DIAGNOSIS — K559 Vascular disorder of intestine, unspecified: Secondary | ICD-10-CM | POA: Diagnosis not present

## 2023-07-22 DIAGNOSIS — R0600 Dyspnea, unspecified: Secondary | ICD-10-CM

## 2023-07-22 DIAGNOSIS — C801 Malignant (primary) neoplasm, unspecified: Secondary | ICD-10-CM | POA: Diagnosis present

## 2023-07-22 DIAGNOSIS — K529 Noninfective gastroenteritis and colitis, unspecified: Principal | ICD-10-CM

## 2023-07-22 DIAGNOSIS — R1900 Intra-abdominal and pelvic swelling, mass and lump, unspecified site: Secondary | ICD-10-CM | POA: Diagnosis present

## 2023-07-22 DIAGNOSIS — B259 Cytomegaloviral disease, unspecified: Secondary | ICD-10-CM | POA: Diagnosis not present

## 2023-07-22 DIAGNOSIS — R000 Tachycardia, unspecified: Secondary | ICD-10-CM | POA: Diagnosis not present

## 2023-07-22 DIAGNOSIS — Z5986 Financial insecurity: Secondary | ICD-10-CM

## 2023-07-22 DIAGNOSIS — E860 Dehydration: Secondary | ICD-10-CM | POA: Diagnosis present

## 2023-07-22 DIAGNOSIS — G893 Neoplasm related pain (acute) (chronic): Secondary | ICD-10-CM

## 2023-07-22 DIAGNOSIS — R069 Unspecified abnormalities of breathing: Secondary | ICD-10-CM | POA: Diagnosis not present

## 2023-07-22 DIAGNOSIS — G8929 Other chronic pain: Secondary | ICD-10-CM | POA: Diagnosis present

## 2023-07-22 DIAGNOSIS — I119 Hypertensive heart disease without heart failure: Secondary | ICD-10-CM | POA: Diagnosis present

## 2023-07-22 DIAGNOSIS — R7401 Elevation of levels of liver transaminase levels: Secondary | ICD-10-CM | POA: Diagnosis present

## 2023-07-22 DIAGNOSIS — Z1152 Encounter for screening for COVID-19: Secondary | ICD-10-CM

## 2023-07-22 DIAGNOSIS — E785 Hyperlipidemia, unspecified: Secondary | ICD-10-CM | POA: Diagnosis present

## 2023-07-22 DIAGNOSIS — E222 Syndrome of inappropriate secretion of antidiuretic hormone: Secondary | ICD-10-CM | POA: Diagnosis present

## 2023-07-22 DIAGNOSIS — Z5982 Transportation insecurity: Secondary | ICD-10-CM

## 2023-07-22 DIAGNOSIS — I731 Thromboangiitis obliterans [Buerger's disease]: Secondary | ICD-10-CM | POA: Diagnosis present

## 2023-07-22 DIAGNOSIS — I2510 Atherosclerotic heart disease of native coronary artery without angina pectoris: Secondary | ICD-10-CM | POA: Diagnosis present

## 2023-07-22 DIAGNOSIS — D696 Thrombocytopenia, unspecified: Secondary | ICD-10-CM | POA: Diagnosis not present

## 2023-07-22 DIAGNOSIS — M549 Dorsalgia, unspecified: Secondary | ICD-10-CM | POA: Diagnosis present

## 2023-07-22 DIAGNOSIS — N9489 Other specified conditions associated with female genital organs and menstrual cycle: Secondary | ICD-10-CM

## 2023-07-22 DIAGNOSIS — D61811 Other drug-induced pancytopenia: Secondary | ICD-10-CM | POA: Diagnosis not present

## 2023-07-22 DIAGNOSIS — D84821 Immunodeficiency due to drugs: Secondary | ICD-10-CM | POA: Diagnosis present

## 2023-07-22 DIAGNOSIS — K5150 Left sided colitis without complications: Secondary | ICD-10-CM | POA: Diagnosis present

## 2023-07-22 DIAGNOSIS — A419 Sepsis, unspecified organism: Secondary | ICD-10-CM | POA: Diagnosis present

## 2023-07-22 DIAGNOSIS — Z515 Encounter for palliative care: Secondary | ICD-10-CM

## 2023-07-22 DIAGNOSIS — R112 Nausea with vomiting, unspecified: Secondary | ICD-10-CM

## 2023-07-22 DIAGNOSIS — R6510 Systemic inflammatory response syndrome (SIRS) of non-infectious origin without acute organ dysfunction: Secondary | ICD-10-CM

## 2023-07-22 DIAGNOSIS — B258 Other cytomegaloviral diseases: Secondary | ICD-10-CM | POA: Diagnosis present

## 2023-07-22 DIAGNOSIS — J449 Chronic obstructive pulmonary disease, unspecified: Secondary | ICD-10-CM | POA: Diagnosis present

## 2023-07-22 DIAGNOSIS — A0839 Other viral enteritis: Secondary | ICD-10-CM

## 2023-07-22 DIAGNOSIS — Z5941 Food insecurity: Secondary | ICD-10-CM

## 2023-07-22 DIAGNOSIS — R0689 Other abnormalities of breathing: Secondary | ICD-10-CM | POA: Diagnosis not present

## 2023-07-22 DIAGNOSIS — C779 Secondary and unspecified malignant neoplasm of lymph node, unspecified: Secondary | ICD-10-CM | POA: Diagnosis present

## 2023-07-22 DIAGNOSIS — D61810 Antineoplastic chemotherapy induced pancytopenia: Secondary | ICD-10-CM | POA: Diagnosis present

## 2023-07-22 DIAGNOSIS — E876 Hypokalemia: Secondary | ICD-10-CM | POA: Diagnosis not present

## 2023-07-22 DIAGNOSIS — D61818 Other pancytopenia: Secondary | ICD-10-CM | POA: Diagnosis not present

## 2023-07-22 DIAGNOSIS — Z79899 Other long term (current) drug therapy: Secondary | ICD-10-CM

## 2023-07-22 DIAGNOSIS — Z87891 Personal history of nicotine dependence: Secondary | ICD-10-CM

## 2023-07-22 DIAGNOSIS — M62838 Other muscle spasm: Secondary | ICD-10-CM | POA: Diagnosis present

## 2023-07-22 DIAGNOSIS — Z923 Personal history of irradiation: Secondary | ICD-10-CM

## 2023-07-22 DIAGNOSIS — R197 Diarrhea, unspecified: Secondary | ICD-10-CM | POA: Diagnosis not present

## 2023-07-22 LAB — RESP PANEL BY RT-PCR (RSV, FLU A&B, COVID)  RVPGX2
Influenza A by PCR: NEGATIVE
Influenza B by PCR: NEGATIVE
Resp Syncytial Virus by PCR: NEGATIVE
SARS Coronavirus 2 by RT PCR: NEGATIVE

## 2023-07-22 LAB — OSMOLALITY, URINE: Osmolality, Ur: 400 mOsm/kg (ref 300–900)

## 2023-07-22 LAB — GASTROINTESTINAL PANEL BY PCR, STOOL (REPLACES STOOL CULTURE)

## 2023-07-22 LAB — CBC WITH DIFFERENTIAL/PLATELET
Abs Immature Granulocytes: 1.23 10*3/uL — ABNORMAL HIGH (ref 0.00–0.07)
Basophils Absolute: 0 10*3/uL (ref 0.0–0.1)
Basophils Relative: 0 %
Eosinophils Absolute: 0.1 10*3/uL (ref 0.0–0.5)
Eosinophils Relative: 2 %
HCT: 31.5 % — ABNORMAL LOW (ref 36.0–46.0)
Hemoglobin: 10.4 g/dL — ABNORMAL LOW (ref 12.0–15.0)
Immature Granulocytes: 21 %
Lymphocytes Relative: 7 %
Lymphs Abs: 0.4 10*3/uL — ABNORMAL LOW (ref 0.7–4.0)
MCH: 30.9 pg (ref 26.0–34.0)
MCHC: 33 g/dL (ref 30.0–36.0)
MCV: 93.5 fL (ref 80.0–100.0)
Monocytes Absolute: 0.1 10*3/uL (ref 0.1–1.0)
Monocytes Relative: 1 %
Neutro Abs: 3.9 10*3/uL (ref 1.7–7.7)
Neutrophils Relative %: 69 %
Platelets: 85 10*3/uL — ABNORMAL LOW (ref 150–400)
RBC: 3.37 MIL/uL — ABNORMAL LOW (ref 3.87–5.11)
RDW: 15.4 % (ref 11.5–15.5)
Smear Review: NORMAL
WBC: 5.8 10*3/uL (ref 4.0–10.5)
nRBC: 0 % (ref 0.0–0.2)

## 2023-07-22 LAB — URINALYSIS, W/ REFLEX TO CULTURE (INFECTION SUSPECTED)
Bacteria, UA: NONE SEEN
Bilirubin Urine: NEGATIVE
Glucose, UA: NEGATIVE mg/dL
Hgb urine dipstick: NEGATIVE
Ketones, ur: NEGATIVE mg/dL
Leukocytes,Ua: NEGATIVE
Nitrite: NEGATIVE
Protein, ur: NEGATIVE mg/dL
Specific Gravity, Urine: >1.046 — ABNORMAL HIGH (ref 1.005–1.030)
pH: 6 (ref 5.0–8.0)

## 2023-07-22 LAB — COMPREHENSIVE METABOLIC PANEL
ALT: 55 U/L — ABNORMAL HIGH (ref 0–44)
AST: 37 U/L (ref 15–41)
Albumin: 3.6 g/dL (ref 3.5–5.0)
Alkaline Phosphatase: 348 U/L — ABNORMAL HIGH (ref 38–126)
Anion gap: 10 (ref 5–15)
BUN: 17 mg/dL (ref 6–20)
CO2: 19 mmol/L — ABNORMAL LOW (ref 22–32)
Calcium: 8.2 mg/dL — ABNORMAL LOW (ref 8.9–10.3)
Chloride: 99 mmol/L (ref 98–111)
Creatinine, Ser: 0.54 mg/dL (ref 0.44–1.00)
GFR, Estimated: >60 mL/min (ref >60)
Glucose, Bld: 115 mg/dL — ABNORMAL HIGH (ref 70–99)
Potassium: 3.4 mmol/L — ABNORMAL LOW (ref 3.5–5.1)
Sodium: 128 mmol/L — ABNORMAL LOW (ref 135–145)
Total Bilirubin: 0.9 mg/dL (ref 0.3–1.2)
Total Protein: 7.2 g/dL (ref 6.5–8.1)

## 2023-07-22 LAB — POC URINE PREG, ED: Preg Test, Ur: NEGATIVE

## 2023-07-22 LAB — APTT: aPTT: 32 seconds (ref 24–36)

## 2023-07-22 LAB — HIV ANTIBODY (ROUTINE TESTING W REFLEX): HIV Screen 4th Generation wRfx: NONREACTIVE

## 2023-07-22 LAB — LACTIC ACID, PLASMA: Lactic Acid, Venous: 1 mmol/L (ref 0.5–1.9)

## 2023-07-22 LAB — OSMOLALITY: Osmolality: 274 mOsm/kg — ABNORMAL LOW (ref 275–295)

## 2023-07-22 LAB — C DIFFICILE QUICK SCREEN W PCR REFLEX
C Diff antigen: NEGATIVE
C Diff interpretation: NOT DETECTED
C Diff toxin: NEGATIVE

## 2023-07-22 LAB — LIPASE, BLOOD: Lipase: 19 U/L (ref 11–51)

## 2023-07-22 LAB — PROTIME-INR
INR: 1.1 (ref 0.8–1.2)
Prothrombin Time: 13.9 seconds (ref 11.4–15.2)

## 2023-07-22 LAB — SODIUM, URINE, RANDOM: Sodium, Ur: 57 mmol/L

## 2023-07-22 LAB — SODIUM: Sodium: 129 mmol/L — ABNORMAL LOW (ref 135–145)

## 2023-07-22 LAB — BRAIN NATRIURETIC PEPTIDE: B Natriuretic Peptide: 33.9 pg/mL (ref 0.0–100.0)

## 2023-07-22 LAB — PROCALCITONIN: Procalcitonin: 0.27 ng/mL

## 2023-07-22 MED ORDER — TIZANIDINE HCL 2 MG PO TABS: 2.0000 mg | ORAL_TABLET | Freq: Every day | ORAL | Status: DC

## 2023-07-22 MED ORDER — PROCHLORPERAZINE MALEATE 10 MG PO TABS: 10.0000 mg | ORAL_TABLET | Freq: Four times a day (QID) | ORAL | Status: DC | PRN

## 2023-07-22 MED ORDER — MEGESTROL ACETATE 20 MG PO TABS
40.0000 mg | ORAL_TABLET | Freq: Every day | ORAL | Status: DC
  Administered 2023-07-23 – 2023-08-03 (×10): 40 mg via ORAL
  Filled 2023-07-22 (×14): qty 2

## 2023-07-22 MED ORDER — PANTOPRAZOLE SODIUM 40 MG PO TBEC
40.0000 mg | DELAYED_RELEASE_TABLET | Freq: Every day | ORAL | Status: DC
  Administered 2023-07-23 – 2023-08-03 (×10): 40 mg via ORAL
  Filled 2023-07-22 (×12): qty 1

## 2023-07-22 MED ORDER — SODIUM CHLORIDE 0.9 % IV SOLN
2.0000 g | INTRAVENOUS | Status: DC
  Administered 2023-07-22 – 2023-07-24 (×3): 2 g via INTRAVENOUS
  Filled 2023-07-22 (×3): qty 20

## 2023-07-22 MED ORDER — ONDANSETRON HCL 4 MG/2ML IJ SOLN
4.0000 mg | Freq: Four times a day (QID) | INTRAMUSCULAR | Status: DC | PRN
  Administered 2023-07-22 – 2023-07-30 (×13): 4 mg via INTRAVENOUS
  Filled 2023-07-22 (×15): qty 2

## 2023-07-22 MED ORDER — HYDROMORPHONE HCL 1 MG/ML IJ SOLN: 0.5000 mg | INTRAMUSCULAR | Status: DC | PRN

## 2023-07-22 MED ORDER — FLORANEX PO PACK
1.0000 g | PACK | Freq: Three times a day (TID) | ORAL | Status: DC
  Administered 2023-07-22 – 2023-07-27 (×11): 1 g via ORAL
  Filled 2023-07-22 (×22): qty 1

## 2023-07-22 MED ORDER — ENOXAPARIN SODIUM 40 MG/0.4ML IJ SOSY
40.0000 mg | PREFILLED_SYRINGE | INTRAMUSCULAR | Status: DC
  Administered 2023-07-22 – 2023-07-27 (×6): 40 mg via SUBCUTANEOUS
  Filled 2023-07-22 (×6): qty 0.4

## 2023-07-22 MED ORDER — LACTATED RINGERS IV BOLUS (SEPSIS)
1000.0000 mL | Freq: Once | INTRAVENOUS | Status: AC
  Administered 2023-07-22: 1000 mL via INTRAVENOUS

## 2023-07-22 MED ORDER — TRAMADOL HCL ER 100 MG PO TB24: 100.0000 mg | ORAL_TABLET | Freq: Every day | ORAL | Status: DC

## 2023-07-22 MED ORDER — VANCOMYCIN HCL 10 G IV SOLR
1750.0000 mg | Freq: Once | INTRAVENOUS | Status: DC
  Filled 2023-07-22: qty 17.5

## 2023-07-22 MED ORDER — MORPHINE SULFATE (PF) 4 MG/ML IV SOLN
4.0000 mg | Freq: Once | INTRAVENOUS | Status: AC
  Administered 2023-07-22: 4 mg via INTRAVENOUS
  Filled 2023-07-22: qty 1

## 2023-07-22 MED ORDER — MORPHINE SULFATE (PF) 4 MG/ML IV SOLN
4.0000 mg | INTRAVENOUS | Status: DC | PRN
  Administered 2023-07-22 – 2023-07-23 (×8): 4 mg via INTRAVENOUS
  Filled 2023-07-22 (×8): qty 1

## 2023-07-22 MED ORDER — ONDANSETRON HCL 4 MG/2ML IJ SOLN
4.0000 mg | Freq: Once | INTRAMUSCULAR | Status: AC
  Administered 2023-07-22: 4 mg via INTRAVENOUS
  Filled 2023-07-22: qty 2

## 2023-07-22 MED ORDER — VANCOMYCIN HCL IN DEXTROSE 750-5 MG/150ML-% IV SOLN
750.0000 mg | Freq: Once | INTRAVENOUS | Status: AC
  Administered 2023-07-22: 750 mg via INTRAVENOUS
  Filled 2023-07-22: qty 150

## 2023-07-22 MED ORDER — IOHEXOL 350 MG/ML SOLN
100.0000 mL | Freq: Once | INTRAVENOUS | Status: AC | PRN
  Administered 2023-07-22: 100 mL via INTRAVENOUS

## 2023-07-22 MED ORDER — VANCOMYCIN HCL IN DEXTROSE 1-5 GM/200ML-% IV SOLN
1000.0000 mg | Freq: Once | INTRAVENOUS | Status: AC
  Administered 2023-07-22: 1000 mg via INTRAVENOUS
  Filled 2023-07-22: qty 200

## 2023-07-22 MED ORDER — BACID PO TABS
2.0000 | ORAL_TABLET | Freq: Three times a day (TID) | ORAL | Status: DC
  Filled 2023-07-22: qty 2

## 2023-07-22 MED ORDER — GABAPENTIN 300 MG PO CAPS
300.0000 mg | ORAL_CAPSULE | Freq: Three times a day (TID) | ORAL | Status: DC
  Administered 2023-07-22 – 2023-08-03 (×31): 300 mg via ORAL
  Filled 2023-07-22 (×39): qty 1

## 2023-07-22 MED ORDER — VANCOMYCIN HCL IN DEXTROSE 1-5 GM/200ML-% IV SOLN: 1000.0000 mg | Freq: Once | INTRAVENOUS | Status: DC

## 2023-07-22 MED ORDER — HYDROCORTISONE (PERIANAL) 2.5 % EX CREA
TOPICAL_CREAM | Freq: Two times a day (BID) | CUTANEOUS | Status: DC
  Filled 2023-07-22 (×2): qty 28.35

## 2023-07-22 MED ORDER — LACTATED RINGERS IV SOLN: INTRAVENOUS | Status: AC

## 2023-07-22 MED ORDER — METRONIDAZOLE 500 MG/100ML IV SOLN
500.0000 mg | Freq: Two times a day (BID) | INTRAVENOUS | Status: DC
  Administered 2023-07-22 – 2023-07-24 (×4): 500 mg via INTRAVENOUS
  Filled 2023-07-22 (×5): qty 100

## 2023-07-22 MED ORDER — DULOXETINE HCL 30 MG PO CPEP
30.0000 mg | ORAL_CAPSULE | Freq: Every day | ORAL | Status: DC
  Administered 2023-07-23 – 2023-07-24 (×2): 30 mg via ORAL
  Filled 2023-07-22 (×2): qty 1

## 2023-07-22 MED ORDER — KETOROLAC TROMETHAMINE 30 MG/ML IJ SOLN
30.0000 mg | Freq: Once | INTRAMUSCULAR | Status: AC
  Administered 2023-07-22: 30 mg via INTRAVENOUS
  Filled 2023-07-22: qty 1

## 2023-07-22 MED ORDER — SODIUM CHLORIDE 0.9 % IV SOLN
2.0000 g | Freq: Once | INTRAVENOUS | Status: AC
  Administered 2023-07-22: 2 g via INTRAVENOUS
  Filled 2023-07-22: qty 12.5

## 2023-07-22 MED ORDER — ACETAMINOPHEN 325 MG PO TABS
650.0000 mg | ORAL_TABLET | Freq: Four times a day (QID) | ORAL | Status: DC | PRN
  Administered 2023-07-23 – 2023-08-01 (×10): 650 mg via ORAL
  Filled 2023-07-22 (×11): qty 2

## 2023-07-22 MED ORDER — HYDROCORTISONE 2.5 % EX CREA: 1.0000 | TOPICAL_CREAM | Freq: Two times a day (BID) | CUTANEOUS | Status: DC

## 2023-07-22 MED ORDER — FENTANYL CITRATE PF 50 MCG/ML IJ SOSY
50.0000 ug | PREFILLED_SYRINGE | Freq: Once | INTRAMUSCULAR | Status: AC
  Administered 2023-07-22: 50 ug via INTRAVENOUS
  Filled 2023-07-22: qty 1

## 2023-07-22 MED ORDER — CILOSTAZOL 100 MG PO TABS: 100.0000 mg | ORAL_TABLET | Freq: Two times a day (BID) | ORAL | Status: DC

## 2023-07-22 MED ORDER — HYDRALAZINE HCL 20 MG/ML IJ SOLN: 5.0000 mg | Freq: Four times a day (QID) | INTRAMUSCULAR | Status: DC | PRN

## 2023-07-22 MED ORDER — POTASSIUM CHLORIDE CRYS ER 20 MEQ PO TBCR
40.0000 meq | EXTENDED_RELEASE_TABLET | Freq: Once | ORAL | Status: DC
  Filled 2023-07-22 (×2): qty 2

## 2023-07-22 MED ORDER — TRAMADOL HCL 50 MG PO TABS
50.0000 mg | ORAL_TABLET | Freq: Two times a day (BID) | ORAL | Status: DC | PRN
  Administered 2023-07-22 – 2023-07-23 (×2): 50 mg via ORAL
  Filled 2023-07-22 (×3): qty 1

## 2023-07-22 MED ORDER — LACTATED RINGERS IV BOLUS (SEPSIS)
500.0000 mL | Freq: Once | INTRAVENOUS | Status: AC
  Administered 2023-07-22: 500 mL via INTRAVENOUS

## 2023-07-22 MED ORDER — METRONIDAZOLE 500 MG/100ML IV SOLN
500.0000 mg | Freq: Once | INTRAVENOUS | Status: AC
  Administered 2023-07-22: 500 mg via INTRAVENOUS
  Filled 2023-07-22: qty 100

## 2023-07-22 NOTE — Progress Notes (Signed)
PHARMACY -  BRIEF ANTIBIOTIC NOTE   Pharmacy has received consult(s) for Vancomycin from an ED provider.  The patient's profile has been reviewed for ht / wt / allergies / indication / available labs.    One time order(s) placed for: Vancomycin 1750 mg per pt wt: 79 kg  Further antibiotics/pharmacy consults should be ordered by admitting physician if indicated.                       Thank you, Otelia Sergeant, PharmD, [facility] 07/22/2023 2:11 AM

## 2023-07-22 NOTE — Progress Notes (Signed)
CODE SEPSIS - PHARMACY COMMUNICATION  **Broad Spectrum Antibiotics should be administered within 1 hour of Sepsis diagnosis**  Time Code Sepsis Called/Page Received: 0212  Antibiotics Ordered: Cefepime, Flagyl, Vancomycin  Time of 1st antibiotic administration: 0240  Otelia Sergeant, PharmD, MBA 07/22/2023 2:12 AM

## 2023-07-22 NOTE — Progress Notes (Signed)
Elink monitoring for the code sepsis protocol.  

## 2023-07-22 NOTE — ED Provider Notes (Addendum)
[facility] Provider Note    Event Date/Time   First MD Initiated Contact with Patient 07/22/23 0142     (approximate)   History   Weakness and Emesis   HPI  Sheryl Silva is a 47 y.o. 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  Patient has a known left adnexal mass but states that she has not had pain in this area until today.  She has had shortness of breath with her chemotherapy but it is worse today.  No chest pain.  She is not aware of any fevers.  States her last chemotherapy was 9/16 with radiation on 9/17.   History provided by patient.    Past Medical History:  Diagnosis Date   Anxiety    Cancer (HCC)    Depression    Heart murmur    Hypertension     Past Surgical History:  Procedure Laterality Date   CESAREAN SECTION     COLONOSCOPY WITH PROPOFOL N/A 02/21/2023   Procedure: COLONOSCOPY WITH PROPOFOL;  Surgeon: Regis Bill, MD;  Location: [facility] ENDOSCOPY;  Service: Endoscopy;  Laterality: N/A;   DILATION AND CURETTAGE OF UTERUS     IR IMAGING GUIDED PORT INSERTION  02/16/2023    MEDICATIONS:  Prior to Admission medications   Medication Sig Start Date End Date Taking? Authorizing Provider  albuterol (PROVENTIL) (2.5 MG/3ML) 0.083% nebulizer solution Take 2.5 mg by nebulization every 6 (six) hours as needed for wheezing or shortness of breath. Patient not taking: Reported on 06/13/2023    [provider]  atenolol-chlorthalidone (TENORETIC) 50-25 MG tablet Take 1 tablet by mouth daily. Patient not taking: Reported on 06/13/2023 03/22/23   [provider]  Buprenorphine HCl-Naloxone HCl 4-1 MG FILM Place 4 mg of opioid under the tongue every 12 (twelve) hours. Patient not taking: Reported on 06/13/2023 03/24/23    [provider]  cetirizine (ZYRTEC ALLERGY) 10 MG tablet Take 1 tablet (10 mg total) by mouth daily. Patient not taking: Reported on 06/13/2023 10/19/20 03/02/23  Orvil Feil, PA-C  cilostazol (PLETAL) 100 MG tablet Take 100 mg by mouth 2 (two) times daily. 04/25/23   [provider]  cloNIDine (CATAPRES) 0.1 MG tablet Take 1 tablet (0.1 mg total) by mouth daily as needed (Sustained Systolic pressure of 200 or higher). 06/16/23 06/15/24  Cuthriell, Delorise Royals, PA-C  cyanocobalamin (VITAMIN B12) 500 MCG tablet Take 500 mcg by mouth daily.    [provider]  DULoxetine (CYMBALTA) 30 MG capsule Take 30 mg by mouth daily.    [provider]  gabapentin (NEURONTIN) 300 MG capsule Take 300 mg by mouth 3 (three) times daily.    [provider]  hydrocortisone 2.5 % cream Apply 1 Application topically 2 (two) times daily. 03/26/23   [provider]  lidocaine-prilocaine (EMLA) cream Apply to affected area once 02/25/23   Jeralyn Ruths, MD  megestrol (MEGACE) 40 MG tablet TAKE 1 TABLET BY MOUTH EVERY DAY 06/27/23   Jeralyn Ruths, MD  Multiple Vitamin (MULTIVITAMIN) tablet Take 1 tablet by mouth daily.    [provider]  naloxone [facility]) nasal spray 4 mg/0.1 mL Place 1 spray into the nose once. 02/09/23   [provider]  omeprazole (PRILOSEC) 20 MG capsule Take 20 mg by mouth daily.    [provider]  ondansetron (ZOFRAN) 8 MG tablet Take 1 tablet (8 mg total) by mouth every 8 (eight) hours as needed for nausea or vomiting. Start on the third day after chemotherapy. 02/25/23   Jeralyn Ruths, MD  ondansetron (ZOFRAN-ODT) 4 MG disintegrating tablet Take 4 mg by mouth 2 (two) times daily as needed for nausea or vomiting. Patient not taking: Reported on 06/13/2023 01/26/23   [provider]  oxyCODONE-acetaminophen (PERCOCET/ROXICET) 5-325 MG tablet Take 1 tablet by mouth 2 (two) times daily. Patient not  taking: Reported on 04/04/2023 09/14/22   [provider]  polyethylene glycol (MIRALAX / GLYCOLAX) 17 g packet Take 17 g by mouth daily. Patient not taking: Reported on 06/13/2023 02/09/23   [provider]  prochlorperazine (COMPAZINE) 10 MG tablet Take 1 tablet (10 mg total) by mouth every 6 (six) hours as needed for nausea or vomiting. 02/25/23   Jeralyn Ruths, MD  SENEXON-S 8.6-50 MG tablet Take 1 tablet by mouth 2 (two) times daily. Patient not taking: Reported on 06/13/2023 03/26/23   [provider]  tiZANidine (ZANAFLEX) 2 MG tablet Take 2 mg by mouth at bedtime. 01/20/23   [provider]  traMADol (ULTRAM-ER) 100 MG 24 hr tablet Take 100 mg by mouth daily. 01/26/23   [provider]  Vitamin D, Ergocalciferol, (DRISDOL) 1.25 MG (50000 UNIT) CAPS capsule Take 50,000 Units by mouth every 7 (seven) days. 01/30/23   [provider]    Physical Exam   Triage Vital Signs: ED Triage Vitals  Encounter Vitals Group     BP 07/22/23 0200 124/87     Systolic BP Percentile --      Diastolic BP Percentile --      Pulse Rate 07/22/23 0200 (!) 115     Resp 07/22/23 0148 (!) 21     Temp 07/22/23 0200 98.6 F (37 C)     Temp Source 07/22/23 0200 Oral     SpO2 07/22/23 0148 100 %     Weight --      Height --      Head Circumference --      Peak Flow --      Pain Score 07/22/23 0146 0     Pain Loc --      Pain Education --      Exclude from Growth Chart --      Most recent vital signs: Vitals:   07/22/23 0600 07/22/23 0700  BP: 108/73 108/79  Pulse: (!) 110 (!) 101  Resp: 17 (!) 21  Temp:    SpO2: 96% 96%    CONSTITUTIONAL: Alert, responds appropriately to questions.  Appears uncomfortable. HEAD: Normocephalic, atraumatic EYES: Conjunctivae clear, pupils appear equal, sclera nonicteric ENT: normal nose; moist mucous membranes NECK: Supple, normal ROM CARD: Regular and tachycardic; S1 and S2 appreciated RESP: Slightly  tachypneic, breath sounds clear and equal bilaterally; no wheezes, no rhonchi, no rales, no hypoxia or respiratory distress, speaking full sentences ABD/GI: Non-distended; soft, tender to palpation the left lower quadrant with guarding, no rebound BACK: The back appears normal EXT: Normal ROM in all joints; no deformity noted, no edema SKIN: Normal color for age and race; warm; no rash on exposed skin NEURO: Moves all extremities equally, normal speech PSYCH: The patient's mood and manner are appropriate.   ED Results / Procedures / Treatments   LABS: (all labs ordered are listed, but only abnormal results are displayed) Labs Reviewed  COMPREHENSIVE METABOLIC PANEL - Abnormal; Notable for  the following components:      Result Value   Sodium 128 (*)    Potassium 3.4 (*)    CO2 19 (*)    Glucose, Bld 115 (*)    Calcium 8.2 (*)    ALT 55 (*)    Alkaline Phosphatase 348 (*)    All other components within normal limits  CBC WITH DIFFERENTIAL/PLATELET - Abnormal; Notable for the following components:   RBC 3.37 (*)    Hemoglobin 10.4 (*)    HCT 31.5 (*)    Platelets 85 (*)    Lymphs Abs 0.4 (*)    Abs Immature Granulocytes 1.23 (*)    All other components within normal limits  URINALYSIS, W/ REFLEX TO CULTURE (INFECTION SUSPECTED) - Abnormal; Notable for the following components:   Color, Urine YELLOW (*)    APPearance HAZY (*)    Specific Gravity, Urine >1.046 (*)    All other components within normal limits  RESP PANEL BY RT-PCR (RSV, FLU A&B, COVID)  RVPGX2  CULTURE, BLOOD (ROUTINE X 2)  CULTURE, BLOOD (ROUTINE X 2)  C DIFFICILE QUICK SCREEN W PCR REFLEX    GASTROINTESTINAL PANEL BY PCR, STOOL (REPLACES STOOL CULTURE)  LACTIC ACID, PLASMA  PROTIME-INR  APTT  LIPASE, BLOOD  PROCALCITONIN  POC URINE PREG, ED     EKG:   Date: 07/22/2023 1:49AM  Rate: 121  Rhythm: Sinus tachycardia  QRS Axis: normal  Intervals: normal  ST/T Wave abnormalities: normal  Conduction  Disutrbances: none  Narrative Interpretation: Sinus tachycardia     RADIOLOGY: My personal review and interpretation of imaging: CT scan shows colitis.  I have personally reviewed all radiology reports.   US PELVIC COMPLETE W TRANSVAGINAL AND TORSION R/O  Result Date: 07/22/2023 CLINICAL DATA:  [id number] with pelvic pain for 5 days. History of metastatic adrenal carcinoma and steadily enlarging left adnexal/pelvic mass on CT with a normal left ovary not able to be distinguished from it. There was also evidence of distal descending and proximal to mid sigmoid colitis/diverticulitis on CT today. EXAM: TRANSABDOMINAL AND TRANSVAGINAL ULTRASOUND OF PELVIS DOPPLER ULTRASOUND OF OVARIES TECHNIQUE: Both transabdominal and transvaginal ultrasound examinations of the pelvis were performed. Transabdominal technique was performed for global imaging of the pelvis including uterus, ovaries, adnexal regions, and pelvic cul-de-sac. It was necessary to proceed with endovaginal exam following the transabdominal exam to visualize the uterus, right ovary and endometrium better. Color and duplex Doppler ultrasound was utilized to evaluate blood flow to the ovaries. COMPARISON:  CT with IV contrast today, PET-CT 07/06/2023, and earlier CT studies from this year back to 12/18/2022. FINDINGS: Uterus Measurements: Anteverted and heterogeneous measuring 7.2 x 3.8 x 4.3 cm = volume: 61.2 mL. No fibroids or other mass visualized. Incidentally noted is a 2 cm simple nabothian cysts in the cervix. Despite myometrial heterogeneity there is no appreciable focal wall mass. Endometrium Thickness: 5.7 mm.  No focal abnormality visualized. Right ovary Measurements: 3.0 x 1.4 x 2.0 = volume: 4.3 mL. Normal appearance/no adnexal mass. Left ovary There is a heterogeneous hypervascular left adnexal mass which is inseparable and indistinguishable from the left ovary. On CT this measured 5.4 x 4.8 cm. Ultrasound is 5.5 x 4.8 x 5.2 cm and 72 mL.  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 Pulsed Doppler evaluation of both ovaries demonstrates normal low-resistance  arterial and venous waveforms. Other findings Mild anechoic free fluid, which was also seen on CT. IMPRESSION: Heterogeneous hypervascular left adnexal mass which is inseparable from the left ovary. 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 Right ovary, uterus and endometrium are essentially unremarkable with incidental 2 cm simple nabothian cervical cyst. Mildly heterogeneous myometrium. Mild free fluid, nonspecific. Electronically Signed   By: Almira Bar M.D.   On: 07/22/2023 06:01   CT Angio Chest PE W and/or Wo Contrast  Result Date: 07/22/2023 CLINICAL DATA:  Weakness, emesis and diarrhea for the last several days. Currently taking chemotherapy and radiation for metastatic adrenal carcinoma. EXAM: CT ANGIOGRAPHY CHEST CT ABDOMEN AND PELVIS WITH CONTRAST TECHNIQUE: Multidetector CT imaging of the chest was performed using the standard protocol during bolus administration of intravenous contrast. Multiplanar CT image reconstructions and MIPs were obtained to evaluate the vascular anatomy. Multidetector CT imaging of the abdomen and pelvis was performed using the standard protocol during bolus administration of intravenous contrast. RADIATION DOSE REDUCTION: This exam was performed according to the departmental dose-optimization program which includes automated exposure control, adjustment of the mA and/or kV according to patient size and/or use of iterative reconstruction technique. CONTRAST:  OMNIPAQUE IOHEXOL 350  MG/ML SOLN COMPARISON:  Portable chest today, PET-CT 07/06/2023, chest, abdomen and pelvis outside CT images only dated 02/03/2023. FINDINGS: CTA CHEST FINDINGS Cardiovascular: Pulmonary arteries are normal in caliber and do not show embolic filling defects. There is minimal atherosclerosis in the aortic arch. There is no aneurysm, stenosis or dissection. The great vessels are clear.  The pulmonary veins are nondistended. There is mild cardiomegaly with a left chamber predominance. There is a right chest port with IJ approach catheter with tip in the right atrium. There is a small pericardial effusion new from 07/06/2023. No visible coronary calcific plaques. Mediastinum/Nodes: Lower paratracheal space node measuring 8 mm in short axis on 4:45 and adjacent right-sided precarinal lymph node also 8 mm short axis. Subcarinal lymph node up to 9 mm short axis. All of these with uptake on PET-CT but no bulky adenopathy. No hilar adenopathy. In the left axilla multiple enlarged lymph nodes again are noted, with multifocal PET activity. Largest of these is 1.4 x 3.3 cm on 4:17, 2 x 1.5 cm on 4:20 and 2 x 1.5 cm on 4:31. There has been no change since 07/06/2023. There is an enlarged right axillary node measuring 1.3 x 1.8 cm on 4:34. Right cardiophrenic angle lymph nodes largest of these is 9 mm in short axis on 4:95. Lungs/Pleura: Mildly elevated right hemidiaphragm. Unchanged 6 mm pleural-based posterior left upper lobe nodule on 5:36. Unchanged 5 mm pleural-based nodule anteriorly in the left upper lobe on 5:49. There are no other appreciable nodules. There is no consolidation, effusion or pneumothorax. 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 There are scattered thoracic spine endplate Schmorl's nodes but no acute compression fracture. Findings are more confluent and widespread than on 02/03/2023. Review of the MIP images confirms the above findings. CT ABDOMEN and PELVIS FINDINGS  Hepatobiliary: No hepatic mass enhancement. Mild hepatic steatosis. Cholelithiasis without evidence of cholecystitis or biliary dilatation. Pancreas: No abnormality. Spleen: No abnormality. 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 Both kidneys enhance homogeneously. There is no urinary stone or obstruction. No bladder thickening.  Stomach/Bowel: There is fluid in the colon in the ascending and transverse segments. There are increasing thickened folds in the distal descending and proximal/mid sigmoid colon and adjacent stranding consistent with colitis or diverticulitis. There is no wall pneumatosis, free air, or abscess. Vascular/Lymphatic: Small hypermetabolic lymph nodes were noted on the recent PET-CT and have not changed. Examples include an aortocaval lymph node 7 mm short axis on 11: 39, left external iliac chain node is 9 mm short axis on 11:77, with scattered subcentimeter hypermetabolic inguinal lymph nodes also recently seen. By strict size criteria, majority of the hypermetabolic nodes are not frankly enlarged. There are slightly prominent left periaortic chain nodes up to 1 cm in short axis. Large heterogeneous left adnexal/pelvic mass again measures 5.4 x 4.8 cm on 11:72. This is about double the size it was on an earlier study this year on 12/18/2022. Reproductive: The uterus is intact. No new adnexal abnormality. 2 cm transmural fibroid of the right side of the fundus. Other: Small volume of pelvic ascites. No free hemorrhage, free air or localizing collections. 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 No visible pathologic regional fracture. Review of the MIP images confirms the above findings. IMPRESSION: 1. No evidence of arterial embolus or acute chest process. 2. Small pericardial effusion, new from 07/06/2023. 3. Cardiomegaly without evidence of CHF. 4. Unchanged 6 mm and 5 mm left upper lobe  nodules. 5. Bilateral axillary adenopathy, left greater than right, with borderline to slightly prominent mediastinal, retroperitoneal and pelvic lymph nodes. Positive PET activity on recent PET-CT. 6. 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 7. Increasing thickened folds in the distal descending and proximal/mid sigmoid colon with adjacent stranding consistent with colitis or diverticulitis. No wall pneumatosis, free air, or abscess. 8. 5.4 x 4.8 cm heterogeneous left adnexal/pelvic mass almost certainly metastatic, about double the size it was on an earlier study this year on 12/18/2022. 9. Small volume of pelvic ascites.  No free air. 10. Cholelithiasis without evidence of cholecystitis. 11. Mild hepatic steatosis. Electronically Signed   By: Almira Bar M.D.   On: 07/22/2023 04:40   CT ABDOMEN PELVIS W CONTRAST  Result Date: 07/22/2023 CLINICAL DATA:  Weakness, emesis and diarrhea for the last several days. Currently taking chemotherapy and radiation for metastatic adrenal carcinoma. EXAM: CT ANGIOGRAPHY CHEST CT ABDOMEN AND PELVIS WITH CONTRAST TECHNIQUE: Multidetector CT imaging of the chest was performed using the standard protocol during bolus administration of intravenous contrast. Multiplanar CT image reconstructions and MIPs were obtained to evaluate the vascular anatomy. Multidetector CT imaging of the abdomen and pelvis was performed using the standard protocol during bolus administration of intravenous contrast. RADIATION DOSE REDUCTION: This exam was performed according to the departmental dose-optimization program which includes automated exposure control, adjustment of the mA and/or kV according to patient size and/or use of iterative reconstruction technique. CONTRAST:  OMNIPAQUE IOHEXOL 350 MG/ML SOLN COMPARISON:  Portable chest today, PET-CT 07/06/2023, chest, abdomen and pelvis outside CT images only dated 02/03/2023. FINDINGS: CTA CHEST FINDINGS  Cardiovascular: Pulmonary arteries are normal in caliber and do not show embolic filling defects. There is minimal atherosclerosis in the aortic arch. There is no aneurysm, stenosis or dissection. The great vessels are clear.  The pulmonary veins are nondistended. There is mild cardiomegaly with a left chamber predominance. There is a right chest port with IJ approach catheter with tip in the right atrium. There is a small pericardial effusion new from 07/06/2023. No visible coronary calcific  plaques. Mediastinum/Nodes: Lower paratracheal space node measuring 8 mm in short axis on 4:45 and adjacent right-sided precarinal lymph node also 8 mm short axis. Subcarinal lymph node up to 9 mm short axis. All of these with uptake on PET-CT but no bulky adenopathy. No hilar adenopathy. In the left axilla multiple enlarged lymph nodes again are noted, with multifocal PET activity. Largest of these is 1.4 x 3.3 cm on 4:17, 2 x 1.5 cm on 4:20 and 2 x 1.5 cm on 4:31. There has been no change since 07/06/2023. There is an enlarged right axillary node measuring 1.3 x 1.8 cm on 4:34. Right cardiophrenic angle lymph nodes largest of these is 9 mm in short axis on 4:95. Lungs/Pleura: Mildly elevated right hemidiaphragm. Unchanged 6 mm pleural-based posterior left upper lobe nodule on 5:36. Unchanged 5 mm pleural-based nodule anteriorly in the left upper lobe on 5:49. There are no other appreciable nodules. There is no consolidation, effusion or pneumothorax. 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 There are scattered thoracic spine endplate Schmorl's nodes but no acute compression fracture. Findings are more confluent and widespread than on 02/03/2023. Review of the MIP images confirms the above findings. CT ABDOMEN and PELVIS FINDINGS Hepatobiliary: No hepatic mass enhancement. Mild hepatic steatosis. Cholelithiasis without evidence of cholecystitis or biliary dilatation. Pancreas: No  abnormality. Spleen: No abnormality. 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 Both kidneys enhance homogeneously. There is no urinary stone or obstruction. No bladder thickening. Stomach/Bowel: There is fluid in the colon in the ascending and transverse segments. There are increasing thickened folds in the distal descending and proximal/mid sigmoid colon and adjacent stranding consistent with colitis or diverticulitis. There is no wall pneumatosis, free air, or abscess. Vascular/Lymphatic: Small hypermetabolic lymph nodes were noted on the recent PET-CT and have not changed. Examples include an aortocaval lymph node 7 mm short axis on 11: 39, left external iliac chain node is 9 mm short axis on 11:77, with scattered subcentimeter hypermetabolic inguinal lymph nodes also recently seen. By strict size criteria, majority of the hypermetabolic nodes are not frankly enlarged. There are slightly prominent left periaortic chain nodes up to 1 cm in short axis. Large heterogeneous left adnexal/pelvic mass again measures 5.4 x 4.8 cm on 11:72. This is about double the size it was on an earlier study this year on 12/18/2022. Reproductive: The uterus is intact. No new adnexal abnormality. 2 cm transmural fibroid of the right side of the fundus. Other: Small volume of pelvic ascites. No free hemorrhage, free air or localizing collections. 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 No visible pathologic regional fracture. Review of the MIP images confirms the above findings. IMPRESSION: 1. No evidence of arterial embolus or acute chest process. 2. Small pericardial effusion, new from 07/06/2023. 3. Cardiomegaly without evidence of CHF. 4. Unchanged 6 mm and 5 mm left upper lobe nodules. 5. Bilateral axillary adenopathy, left greater than right, with borderline to slightly prominent mediastinal, retroperitoneal and pelvic lymph  nodes. Positive PET activity on recent PET-CT. 6. 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 7. Increasing thickened folds in the distal descending and proximal/mid sigmoid colon with adjacent stranding consistent with colitis or diverticulitis. No wall pneumatosis, free air, or abscess. 8. 5.4 x 4.8 cm heterogeneous left adnexal/pelvic mass almost certainly metastatic, about double the size it was on an earlier study this year on 12/18/2022. 9. Small volume of pelvic ascites.  No free air.  10. Cholelithiasis without evidence of cholecystitis. 11. Mild hepatic steatosis. Electronically Signed   By: Almira Bar M.D.   On: 07/22/2023 04:40   DG Chest Port 1 View  Result Date: 07/22/2023 CLINICAL DATA:  Generalized weakness for several days, history of adrenal carcinoma EXAM: PORTABLE CHEST 1 VIEW COMPARISON:  PET-CT from 07/06/2019 FINDINGS: Cardiac shadow is within normal limits. Lungs are well aerated bilaterally. Right chest wall port is noted. Bony structures demonstrate increased sclerosis consistent with metastatic disease. IMPRESSION: Changes consistent with bony metastatic disease. These are stable from prior PET-CT. No acute abnormality noted. Electronically Signed   By: Alcide Clever M.D.   On: 07/22/2023 02:50     PROCEDURES:  Critical Care performed: Yes, see critical care procedure note(s)   CRITICAL CARE Performed by: Baxter Hire Cy Bresee   Total critical care time: 40 minutes  Critical care time was exclusive of separately billable procedures and treating other patients.  Critical care was necessary to treat or prevent imminent or life-threatening deterioration.  Critical care was time spent personally by me on the following activities: development of treatment plan with patient and/or surrogate as well as nursing, discussions with consultants, evaluation of patient's response to treatment, examination of patient, obtaining history from patient or surrogate,  ordering and performing treatments and interventions, ordering and review of laboratory studies, ordering and review of radiographic studies, pulse oximetry and re-evaluation of patient's condition.   Marland Kitchen1-3 Lead EKG Interpretation  Performed by: Olene Godfrey, Layla Maw, DO Authorized by: Snow Peoples, Layla Maw, DO     Interpretation: abnormal     ECG rate:  115   ECG rate assessment: tachycardic     Rhythm: sinus tachycardia     Ectopy: none     Conduction: normal       IMPRESSION / MDM / ASSESSMENT AND PLAN / ED COURSE  I reviewed the triage vital signs and the nursing notes.    Patient here for shortness of breath, left-sided abdominal pain, nausea, vomiting, diarrhea.  Patient is getting chemotherapy.  The patient is on the cardiac monitor to evaluate for evidence of arrhythmia and/or significant heart rate changes.   DIFFERENTIAL DIAGNOSIS (includes but not limited to):   ACS, PE, pneumonia, pleural effusion  Ovarian torsion, worsening adnexal mass, colitis, diverticulitis, viral gastroenteritis, dehydration   Patient's presentation is most consistent with acute presentation with potential threat to life or bodily function.   PLAN: Patient meets SIRS criteria with tachypnea and tachycardia.  Will initiate sepsis workup given she is immunosuppressed on chemotherapy.  Will obtain transvaginal ultrasound with Doppler given left adnexal mass with increased pain today to rule out torsion.  Will also obtain CTA of the chest given tachycardia, tachypnea and shortness of breath to rule out PE.  Will give IV fluids, antibiotics, pain and nausea medicine.   MEDICATIONS GIVEN IN ED: Medications  lactated ringers infusion ( Intravenous New Bag/Given 07/22/23 0544)  lactated ringers bolus 1,000 mL (0 mLs Intravenous Stopped 07/22/23 0310)    And  lactated ringers bolus 1,000 mL (0 mLs Intravenous Stopped 07/22/23 0358)    And  lactated ringers bolus 500 mL (0 mLs Intravenous Stopped 07/22/23 0343)   ceFEPIme (MAXIPIME) 2 g in sodium chloride 0.9 % 100 mL IVPB (0 g Intravenous Stopped 07/22/23 0301)  metroNIDAZOLE (FLAGYL) IVPB 500 mg (0 mg Intravenous Stopped 07/22/23 0433)  morphine (PF) 4 MG/ML injection 4 mg (4 mg Intravenous Given 07/22/23 0233)  ondansetron (ZOFRAN) injection 4 mg (4 mg Intravenous Given 07/22/23 0232)  vancomycin (VANCOCIN) IVPB 1000 mg/200 mL premix (0 mg Intravenous Stopped 07/22/23 0544)    And  vancomycin (VANCOCIN) IVPB 750 mg/150 ml premix (0 mg Intravenous Stopped 07/22/23 0641)  iohexol (OMNIPAQUE) 350 MG/ML injection 100 mL (100 mLs Intravenous Contrast Given 07/22/23 0312)  morphine (PF) 4 MG/ML injection 4 mg (4 mg Intravenous Given 07/22/23 0508)  fentaNYL (SUBLIMAZE) injection 50 mcg (50 mcg Intravenous Given 07/22/23 0606)  ketorolac (TORADOL) 30 MG/ML injection 30 mg (30 mg Intravenous Given 07/22/23 0605)     ED COURSE: Patient's labs show no leukocytosis.  Stable anemia and thrombocytopenia.  Mild hyponatremia which is also chronic.  COVID and flu negative.  Minimally elevated ALT and elevated alkaline phosphatase which are chronic.  Normal lipase.  Urine shows no sign of infection.  Procalcitonin slightly elevated at 0.27 but lactic is normal.  Patient's imaging reviewed and interpreted by myself and the radiologist and shows no PE, pneumonia.  She does have a small pericardial effusion but no signs of tamponade clinically.  She has signs of proximal to mid sigmoid colitis versus diverticulitis without free air, abscess, microperforation.  The left adnexal mass looks like it has doubled in size compared to imaging in February.  It is indistinguishable from the left ovary.  Normal blood flow to both ovaries.  Will discuss with hospitalist for admission given colitis, tachycardia, tachypnea in the setting of an immunocompromised patient on chemotherapy.  Patient comfortable with this plan.  She is still having pain after a couple of rounds of IV morphine.  Will  give Toradol.   CONSULTS:  Consulted and discussed patient's case with hospitalist.  I have recommended admission and consulting physician agrees and will place admission orders.  Patient (and family if present) agree with this plan.   I reviewed all nursing notes, vitals, pertinent previous records.  All labs, EKGs, imaging ordered have been independently reviewed and interpreted by myself.    OUTSIDE RECORDS REVIEWED: Reviewed last oncology note.       FINAL CLINICAL IMPRESSION(S) / ED DIAGNOSES   Final diagnoses:  Nausea vomiting and diarrhea  Dyspnea, unspecified type  Colitis  Adnexal mass  SIRS (systemic inflammatory response syndrome) (HCC)     Rx / DC Orders   ED Discharge Orders     None        Note:  This document was prepared using Dragon voice recognition software and may include unintentional dictation errors.   Sheryl Silva, Layla Maw, DO 07/22/23 0708    Mailee Klaas, Layla Maw, DO 07/22/23 [phone]

## 2023-07-22 NOTE — H&P (Addendum)
History and Physical    Sheryl Silva EXB:[id number] DOB: 05-Dec-1975 DOA: 07/22/2023  PCP: [name], MD (Confirm with patient/family/NH records and if not entered, this has to be entered at [facility] point of entry) Patient coming from: Home  I have personally briefly reviewed patient's old medical records in [facility]  Chief Complaint: Abdominal pain, diarrhea  HPI: Sheryl Silva is a 47 y.o. 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  Patient was diagnosed with stage IV metastatic cancer initially was with on no arranging, pathology from axillary lymph node showed possible GI source.  Patient was started on chemo and radiation therapy with [id number] q. 14 days and so far she completed 7 cycles.  She said that with each cycle of chemotherapy she has had mild diarrhea but was never severe and or self-limiting.  Last chemotherapy and radiation was 9/16-17, this Monday Monday, patient started develop severe cramping pain in the left lower quadrant for 2 days then started to have a severe diarrhea, she did have tenesmus, but last 2 days the cramping pain subsided and she only been feeling soreness in the LLQ area but diarrhea has been worse with watery but no full smell, she has severe night sweat and chills but no fever.  She also has had mild nauseous but no vomiting. ED Course: Afebrile, tachycardia borderline hypotensive nonhypoxic.  Blood work showed WBC 5.8, K 3.4, creatinine 0.5, glucose 115, ALT 55 ALK P 348.  Lactic acid 1.0.  CTA negative for PE, extensive evidence of metastatic cancer involving the bones, lymph nodes.  Acute colitis versus diverticulitis descending and sigmoid colon.  Left adnexal pelvic mass.  Pelvic ultrasound showed heterogeneous hypervascular left adnexal mass probably metastatic in  nature.  Patient was given IV bolus, vancomycin, cefepime and Flagyl, pain medications and Zofran  Review of Systems: As per HPI otherwise 14 point review of systems negative.    Past Medical History:  Diagnosis Date   Anxiety    Cancer (HCC)    Depression    Heart murmur    Hypertension     Past Surgical History:  Procedure Laterality Date   CESAREAN SECTION     COLONOSCOPY WITH PROPOFOL N/A 02/21/2023   Procedure: COLONOSCOPY WITH PROPOFOL;  Surgeon: Regis Bill, MD;  Location: [facility] ENDOSCOPY;  Service: Endoscopy;  Laterality: N/A;   DILATION AND CURETTAGE OF UTERUS     IR IMAGING GUIDED PORT INSERTION  02/16/2023     reports that she quit smoking about 7 months ago. Her smoking use included cigarettes. She has never used smokeless tobacco. She reports current alcohol use. She reports that she does not use drugs.  Allergies  Allergen Reactions   Nifedipine Palpitations    Family History  Problem Relation Age of Onset   Cancer Maternal Grandmother      Prior to Admission medications   Medication Sig Start Date End Date Taking? Authorizing Provider  albuterol (PROVENTIL) (2.5 MG/3ML) 0.083% nebulizer solution Take 2.5 mg by nebulization every 6 (six) hours as needed for wheezing or shortness of breath. Patient not taking: Reported on 06/13/2023    [provider]  atenolol-chlorthalidone (TENORETIC) 50-25 MG tablet Take 1 tablet by mouth daily. Patient not taking: Reported on 06/13/2023 03/22/23   [provider]  Buprenorphine HCl-Naloxone HCl 4-1 MG FILM Place 4 mg of opioid under the tongue  every 12 (twelve) hours. Patient not taking: Reported on 06/13/2023 03/24/23   [provider]  cetirizine (ZYRTEC ALLERGY) 10 MG tablet Take 1 tablet (10 mg total) by mouth daily. Patient not taking: Reported on 06/13/2023 10/19/20 03/02/23  Orvil Feil, PA-C  cilostazol (PLETAL) 100 MG tablet Take 100 mg by mouth 2 (two) times daily. 04/25/23    [provider]  cloNIDine (CATAPRES) 0.1 MG tablet Take 1 tablet (0.1 mg total) by mouth daily as needed (Sustained Systolic pressure of 200 or higher). 06/16/23 06/15/24  Cuthriell, Delorise Royals, PA-C  cyanocobalamin (VITAMIN B12) 500 MCG tablet Take 500 mcg by mouth daily.    [provider]  DULoxetine (CYMBALTA) 30 MG capsule Take 30 mg by mouth daily.    [provider]  gabapentin (NEURONTIN) 300 MG capsule Take 300 mg by mouth 3 (three) times daily.    [provider]  hydrocortisone 2.5 % cream Apply 1 Application topically 2 (two) times daily. 03/26/23   [provider]  lidocaine-prilocaine (EMLA) cream Apply to affected area once 02/25/23   Jeralyn Ruths, MD  megestrol (MEGACE) 40 MG tablet TAKE 1 TABLET BY MOUTH EVERY DAY 06/27/23   Jeralyn Ruths, MD  Multiple Vitamin (MULTIVITAMIN) tablet Take 1 tablet by mouth daily.    [provider]  naloxone [facility]) nasal spray 4 mg/0.1 mL Place 1 spray into the nose once. 02/09/23   [provider]  omeprazole (PRILOSEC) 20 MG capsule Take 20 mg by mouth daily.    [provider]  ondansetron (ZOFRAN) 8 MG tablet Take 1 tablet (8 mg total) by mouth every 8 (eight) hours as needed for nausea or vomiting. Start on the third day after chemotherapy. 02/25/23   Jeralyn Ruths, MD  ondansetron (ZOFRAN-ODT) 4 MG disintegrating tablet Take 4 mg by mouth 2 (two) times daily as needed for nausea or vomiting. Patient not taking: Reported on 06/13/2023 01/26/23   [provider]  oxyCODONE-acetaminophen (PERCOCET/ROXICET) 5-325 MG tablet Take 1 tablet by mouth 2 (two) times daily. Patient not taking: Reported on 04/04/2023 09/14/22   [provider]  polyethylene glycol (MIRALAX / GLYCOLAX) 17 g packet Take 17 g by mouth daily. Patient not taking: Reported on 06/13/2023 02/09/23   [provider]  prochlorperazine (COMPAZINE) 10 MG tablet Take 1 tablet (10  mg total) by mouth every 6 (six) hours as needed for nausea or vomiting. 02/25/23   Jeralyn Ruths, MD  SENEXON-S 8.6-50 MG tablet Take 1 tablet by mouth 2 (two) times daily. Patient not taking: Reported on 06/13/2023 03/26/23   [provider]  tiZANidine (ZANAFLEX) 2 MG tablet Take 2 mg by mouth at bedtime. 01/20/23   [provider]  traMADol (ULTRAM-ER) 100 MG 24 hr tablet Take 100 mg by mouth daily. 01/26/23   [provider]  Vitamin D, Ergocalciferol, (DRISDOL) 1.25 MG (50000 UNIT) CAPS capsule Take 50,000 Units by mouth every 7 (seven) days. 01/30/23   [provider]    Physical Exam: Vitals:   07/22/23 0400 07/22/23 0430 07/22/23 0600 07/22/23 0700  BP: 120/78 135/86 108/73 108/79  Pulse: (!) 117 (!) 104 (!) 110 (!) 101  Resp: 20 18 17  (!) 21  Temp:      TempSrc:      SpO2: 100% 100% 96% 96%    Constitutional: NAD, calm, comfortable Vitals:   07/22/23 0400 07/22/23 0430 07/22/23 0600 07/22/23 0700  BP: 120/78 135/86 108/73 108/79  Pulse: Marland Kitchen)  117 (!) 104 (!) 110 (!) 101  Resp: 20 18 17  (!) 21  Temp:      TempSrc:      SpO2: 100% 100% 96% 96%   Eyes: PERRL, lids and conjunctivae normal ENMT: Mucous membranes are dry. Posterior pharynx clear of any exudate or lesions.Normal dentition.  Neck: normal, supple, no masses, no thyromegaly Respiratory: clear to auscultation bilaterally, no wheezing, no crackles. Normal respiratory effort. No accessory muscle use.  Cardiovascular: Regular rate and rhythm, no murmurs / rubs / gallops. No extremity edema. 2+ pedal pulses. No carotid bruits.  Abdomen: tenderness on the LLQ, no rebound, no guarding no masses palpated. No hepatosplenomegaly. Bowel sounds positive.  Musculoskeletal: no clubbing / cyanosis. No joint deformity upper and lower extremities. Good ROM, no contractures. Normal muscle tone.  Skin: no rashes, lesions, ulcers. No induration Neurologic: CN 2-12 grossly intact. Sensation intact,  DTR normal. Strength 5/5 in all 4.  Psychiatric: Normal judgment and insight. Alert and oriented x 3. Normal mood.     Labs on Admission: I have personally reviewed following labs and imaging studies  CBC: Recent Labs  Lab 07/18/23 0829 07/19/23 1432 07/20/23 1515 07/22/23 0225  WBC 7.5 6.5 5.4 5.8  NEUTROABS 5.1  --  4.7 3.9  HGB 9.3* 9.4* 9.9* 10.4*  HCT 28.4* 28.9* 29.4* 31.5*  MCV 95.9 96.3 93.6 93.5  PLT 101* 97* 84* 85*   Basic Metabolic Panel: Recent Labs  Lab 07/18/23 0829 07/20/23 1515 07/22/23 0225  NA 129* 127* 128*  K 3.7 3.4* 3.4*  CL 103 101 99  CO2 19* 20* 19*  GLUCOSE 159* 142* 115*  BUN 16 24* 17  CREATININE 0.65 0.53 0.54  CALCIUM 8.8* 8.6* 8.2*  MG  --  2.0  --    GFR: Estimated Creatinine Clearance: 80 mL/min (by C-G formula based on SCr of 0.54 mg/dL). Liver Function Tests: Recent Labs  Lab 07/18/23 0829 07/20/23 1515 07/22/23 0225  AST 106* 81* 37  ALT 113* 105* 55*  ALKPHOS 389* 367* 348*  BILITOT 0.5 0.7 0.9  PROT 7.4 7.0 7.2  ALBUMIN 3.8 3.7 3.6   Recent Labs  Lab 07/22/23 0225  LIPASE 19   No results for input(s): "AMMONIA" in the last 168 hours. Coagulation Profile: Recent Labs  Lab 07/22/23 0225  INR 1.1   Cardiac Enzymes: No results for input(s): "CKTOTAL", "CKMB", "CKMBINDEX", "TROPONINI" in the last 168 hours. BNP (last 3 results) No results for input(s): "PROBNP" in the last 8760 hours. HbA1C: No results for input(s): "HGBA1C" in the last 72 hours. CBG: No results for input(s): "GLUCAP" in the last 168 hours. Lipid Profile: No results for input(s): "CHOL", "HDL", "LDLCALC", "TRIG", "CHOLHDL", "LDLDIRECT" in the last 72 hours. Thyroid Function Tests: No results for input(s): "TSH", "T4TOTAL", "FREET4", "T3FREE", "THYROIDAB" in the last 72 hours. Anemia Panel: No results for input(s): "VITAMINB12", "FOLATE", "FERRITIN", "TIBC", "IRON", "RETICCTPCT" in the last 72 hours. Urine analysis:    Component Value  Date/Time   COLORURINE YELLOW (A) 07/22/2023 0349   APPEARANCEUR HAZY (A) 07/22/2023 0349   LABSPEC >1.046 (H) 07/22/2023 0349   PHURINE 6.0 07/22/2023 0349   GLUCOSEU NEGATIVE 07/22/2023 0349   HGBUR NEGATIVE 07/22/2023 0349   BILIRUBINUR NEGATIVE 07/22/2023 0349   KETONESUR NEGATIVE 07/22/2023 0349   PROTEINUR NEGATIVE 07/22/2023 0349   NITRITE NEGATIVE 07/22/2023 0349   LEUKOCYTESUR NEGATIVE 07/22/2023 0349    Radiological Exams on Admission: US PELVIC COMPLETE W TRANSVAGINAL AND TORSION R/O  Result Date: 07/22/2023  CLINICAL DATA:  [id number] with pelvic pain for 5 days. History of metastatic adrenal carcinoma and steadily enlarging left adnexal/pelvic mass on CT with a normal left ovary not able to be distinguished from it. There was also evidence of distal descending and proximal to mid sigmoid colitis/diverticulitis on CT today. EXAM: TRANSABDOMINAL AND TRANSVAGINAL ULTRASOUND OF PELVIS DOPPLER ULTRASOUND OF OVARIES TECHNIQUE: Both transabdominal and transvaginal ultrasound examinations of the pelvis were performed. Transabdominal technique was performed for global imaging of the pelvis including uterus, ovaries, adnexal regions, and pelvic cul-de-sac. It was necessary to proceed with endovaginal exam following the transabdominal exam to visualize the uterus, right ovary and endometrium better. Color and duplex Doppler ultrasound was utilized to evaluate blood flow to the ovaries. COMPARISON:  CT with IV contrast today, PET-CT 07/06/2023, and earlier CT studies from this year back to 12/18/2022. FINDINGS: Uterus Measurements: Anteverted and heterogeneous measuring 7.2 x 3.8 x 4.3 cm = volume: 61.2 mL. No fibroids or other mass visualized. Incidentally noted is a 2 cm simple nabothian cysts in the cervix. Despite myometrial heterogeneity there is no appreciable focal wall mass. Endometrium Thickness: 5.7 mm.  No focal abnormality visualized. Right ovary Measurements: 3.0 x 1.4 x 2.0 = volume: 4.3  mL. Normal appearance/no adnexal mass. Left ovary There is a heterogeneous hypervascular left adnexal mass which is inseparable and indistinguishable from the left ovary. On CT this measured 5.4 x 4.8 cm. Ultrasound is 5.5 x 4.8 x 5.2 cm and 72 mL. 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 Pulsed Doppler evaluation of both ovaries demonstrates normal low-resistance arterial and venous waveforms. Other findings Mild anechoic free fluid, which was also seen on CT. IMPRESSION: Heterogeneous hypervascular left adnexal mass which is inseparable from the left ovary. 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 Right ovary, uterus and endometrium are essentially unremarkable with incidental 2 cm simple nabothian cervical cyst. Mildly heterogeneous myometrium. Mild free fluid, nonspecific. Electronically Signed   By: Almira Bar M.D.   On: 07/22/2023 06:01   CT Angio Chest PE W and/or Wo Contrast  Result Date: 07/22/2023 CLINICAL DATA:  Weakness, emesis and diarrhea for the last several days. Currently taking chemotherapy and radiation for metastatic adrenal carcinoma. EXAM: CT ANGIOGRAPHY CHEST CT ABDOMEN AND PELVIS WITH CONTRAST TECHNIQUE: Multidetector CT imaging of the chest was performed using the standard protocol during bolus administration of intravenous contrast. Multiplanar CT image reconstructions and MIPs were obtained to evaluate the vascular anatomy. Multidetector CT imaging of the abdomen and pelvis was performed using the standard protocol during bolus administration of intravenous contrast. RADIATION DOSE REDUCTION: This exam was  performed according to the departmental dose-optimization program which includes automated exposure control, adjustment of the mA and/or kV according to patient size and/or use of iterative reconstruction technique. CONTRAST:  OMNIPAQUE IOHEXOL 350 MG/ML SOLN COMPARISON:  Portable chest today, PET-CT 07/06/2023, chest, abdomen and pelvis outside CT images only dated 02/03/2023. FINDINGS: CTA CHEST FINDINGS Cardiovascular: Pulmonary arteries are normal in caliber and do not show embolic filling defects. There is minimal atherosclerosis in the aortic arch. There is no aneurysm, stenosis or dissection. The great vessels are clear.  The pulmonary veins are  nondistended. There is mild cardiomegaly with a left chamber predominance. There is a right chest port with IJ approach catheter with tip in the right atrium. There is a small pericardial effusion new from 07/06/2023. No visible coronary calcific plaques. Mediastinum/Nodes: Lower paratracheal space node measuring 8 mm in short axis on 4:45 and adjacent right-sided precarinal lymph node also 8 mm short axis. Subcarinal lymph node up to 9 mm short axis. All of these with uptake on PET-CT but no bulky adenopathy. No hilar adenopathy. In the left axilla multiple enlarged lymph nodes again are noted, with multifocal PET activity. Largest of these is 1.4 x 3.3 cm on 4:17, 2 x 1.5 cm on 4:20 and 2 x 1.5 cm on 4:31. There has been no change since 07/06/2023. There is an enlarged right axillary node measuring 1.3 x 1.8 cm on 4:34. Right cardiophrenic angle lymph nodes largest of these is 9 mm in short axis on 4:95. Lungs/Pleura: Mildly elevated right hemidiaphragm. Unchanged 6 mm pleural-based posterior left upper lobe nodule on 5:36. Unchanged 5 mm pleural-based nodule anteriorly in the left upper lobe on 5:49. There are no other appreciable nodules. There is no consolidation, effusion or pneumothorax. Musculoskeletal: Diffuse osteoblastic metastatic disease in the bony  thorax. There are scattered thoracic spine endplate Schmorl's nodes but no acute compression fracture. Findings are more confluent and widespread than on 02/03/2023. Review of the MIP images confirms the above findings. CT ABDOMEN and PELVIS FINDINGS Hepatobiliary: No hepatic mass enhancement. Mild hepatic steatosis. Cholelithiasis without evidence of cholecystitis or biliary dilatation. Pancreas: No abnormality. Spleen: No abnormality. 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 Both kidneys enhance homogeneously. There is no urinary stone or obstruction. No bladder thickening. Stomach/Bowel: There is fluid in the colon in the ascending and transverse segments. There are increasing thickened folds in the distal descending and proximal/mid sigmoid colon and adjacent stranding consistent with colitis or diverticulitis. There is no wall pneumatosis, free air, or abscess. Vascular/Lymphatic: Small hypermetabolic lymph nodes were noted on the recent PET-CT and have not changed. Examples include an aortocaval lymph node 7 mm short axis on 11: 39, left external iliac chain node is 9 mm short axis on 11:77, with scattered subcentimeter hypermetabolic inguinal lymph nodes also recently seen. By strict size criteria, majority of the hypermetabolic nodes are not frankly enlarged. There are slightly prominent left periaortic chain nodes up to 1 cm in short axis. Large heterogeneous left adnexal/pelvic mass again measures 5.4 x 4.8 cm on 11:72. This is about double the size it was on an earlier study this year on 12/18/2022. Reproductive: The uterus is intact. No new adnexal abnormality. 2 cm transmural fibroid of the right side of the fundus. Other: Small volume of pelvic ascites. No free hemorrhage, free air or localizing collections. 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 No visible pathologic regional fracture. Review of  the MIP images confirms the above findings. IMPRESSION: 1. No evidence of arterial embolus or acute chest process. 2. Small pericardial effusion, new from 07/06/2023. 3. Cardiomegaly without evidence of CHF. 4. Unchanged 6 mm and 5 mm left upper lobe nodules. 5. Bilateral axillary adenopathy, left greater than right, with borderline to slightly prominent mediastinal, retroperitoneal and pelvic lymph nodes. Positive PET activity on recent PET-CT. 6. 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 7. Increasing thickened folds in the distal descending and proximal/mid sigmoid colon with adjacent stranding consistent with colitis or diverticulitis. No wall pneumatosis, free  air, or abscess. 8. 5.4 x 4.8 cm heterogeneous left adnexal/pelvic mass almost certainly metastatic, about double the size it was on an earlier study this year on 12/18/2022. 9. Small volume of pelvic ascites.  No free air. 10. Cholelithiasis without evidence of cholecystitis. 11. Mild hepatic steatosis. Electronically Signed   By: Almira Bar M.D.   On: 07/22/2023 04:40   CT ABDOMEN PELVIS W CONTRAST  Result Date: 07/22/2023 CLINICAL DATA:  Weakness, emesis and diarrhea for the last several days. Currently taking chemotherapy and radiation for metastatic adrenal carcinoma. EXAM: CT ANGIOGRAPHY CHEST CT ABDOMEN AND PELVIS WITH CONTRAST TECHNIQUE: Multidetector CT imaging of the chest was performed using the standard protocol during bolus administration of intravenous contrast. Multiplanar CT image reconstructions and MIPs were obtained to evaluate the vascular anatomy. Multidetector CT imaging of the abdomen and pelvis was performed using the standard protocol during bolus administration of intravenous contrast. RADIATION DOSE REDUCTION: This exam was performed according to the departmental dose-optimization program which includes automated exposure control, adjustment of the mA and/or kV according to patient size and/or  use of iterative reconstruction technique. CONTRAST:  OMNIPAQUE IOHEXOL 350 MG/ML SOLN COMPARISON:  Portable chest today, PET-CT 07/06/2023, chest, abdomen and pelvis outside CT images only dated 02/03/2023. FINDINGS: CTA CHEST FINDINGS Cardiovascular: Pulmonary arteries are normal in caliber and do not show embolic filling defects. There is minimal atherosclerosis in the aortic arch. There is no aneurysm, stenosis or dissection. The great vessels are clear.  The pulmonary veins are nondistended. There is mild cardiomegaly with a left chamber predominance. There is a right chest port with IJ approach catheter with tip in the right atrium. There is a small pericardial effusion new from 07/06/2023. No visible coronary calcific plaques. Mediastinum/Nodes: Lower paratracheal space node measuring 8 mm in short axis on 4:45 and adjacent right-sided precarinal lymph node also 8 mm short axis. Subcarinal lymph node up to 9 mm short axis. All of these with uptake on PET-CT but no bulky adenopathy. No hilar adenopathy. In the left axilla multiple enlarged lymph nodes again are noted, with multifocal PET activity. Largest of these is 1.4 x 3.3 cm on 4:17, 2 x 1.5 cm on 4:20 and 2 x 1.5 cm on 4:31. There has been no change since 07/06/2023. There is an enlarged right axillary node measuring 1.3 x 1.8 cm on 4:34. Right cardiophrenic angle lymph nodes largest of these is 9 mm in short axis on 4:95. Lungs/Pleura: Mildly elevated right hemidiaphragm. Unchanged 6 mm pleural-based posterior left upper lobe nodule on 5:36. Unchanged 5 mm pleural-based nodule anteriorly in the left upper lobe on 5:49. There are no other appreciable nodules. There is no consolidation, effusion or pneumothorax. 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 There are scattered thoracic spine endplate Schmorl's nodes but no acute compression fracture. Findings are more confluent and widespread than on 02/03/2023. Review  of the MIP images confirms the above findings. CT ABDOMEN and PELVIS FINDINGS Hepatobiliary: No hepatic mass enhancement. Mild hepatic steatosis. Cholelithiasis without evidence of cholecystitis or biliary dilatation. Pancreas: No abnormality. Spleen: No abnormality. 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 Both kidneys enhance homogeneously. There is no urinary stone or obstruction. No bladder thickening. Stomach/Bowel: There is fluid in the colon in the ascending and transverse segments. There are increasing thickened folds in the distal descending and proximal/mid sigmoid colon and adjacent stranding consistent with colitis or diverticulitis. There is no wall pneumatosis, free air,  or abscess. Vascular/Lymphatic: Small hypermetabolic lymph nodes were noted on the recent PET-CT and have not changed. Examples include an aortocaval lymph node 7 mm short axis on 11: 39, left external iliac chain node is 9 mm short axis on 11:77, with scattered subcentimeter hypermetabolic inguinal lymph nodes also recently seen. By strict size criteria, majority of the hypermetabolic nodes are not frankly enlarged. There are slightly prominent left periaortic chain nodes up to 1 cm in short axis. Large heterogeneous left adnexal/pelvic mass again measures 5.4 x 4.8 cm on 11:72. This is about double the size it was on an earlier study this year on 12/18/2022. Reproductive: The uterus is intact. No new adnexal abnormality. 2 cm transmural fibroid of the right side of the fundus. Other: Small volume of pelvic ascites. No free hemorrhage, free air or localizing collections. 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 No visible pathologic regional fracture. Review of the MIP images confirms the above findings. IMPRESSION: 1. No evidence of arterial embolus or acute chest process. 2. Small pericardial effusion, new from 07/06/2023. 3.  Cardiomegaly without evidence of CHF. 4. Unchanged 6 mm and 5 mm left upper lobe nodules. 5. Bilateral axillary adenopathy, left greater than right, with borderline to slightly prominent mediastinal, retroperitoneal and pelvic lymph nodes. Positive PET activity on recent PET-CT. 6. 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 7. Increasing thickened folds in the distal descending and proximal/mid sigmoid colon with adjacent stranding consistent with colitis or diverticulitis. No wall pneumatosis, free air, or abscess. 8. 5.4 x 4.8 cm heterogeneous left adnexal/pelvic mass almost certainly metastatic, about double the size it was on an earlier study this year on 12/18/2022. 9. Small volume of pelvic ascites.  No free air. 10. Cholelithiasis without evidence of cholecystitis. 11. Mild hepatic steatosis. Electronically Signed   By: Almira Bar M.D.   On: 07/22/2023 04:40   DG Chest Port 1 View  Result Date: 07/22/2023 CLINICAL DATA:  Generalized weakness for several days, history of adrenal carcinoma EXAM: PORTABLE CHEST 1 VIEW COMPARISON:  PET-CT from 07/06/2019 FINDINGS: Cardiac shadow is within normal limits. Lungs are well aerated bilaterally. Right chest wall port is noted. Bony structures demonstrate increased sclerosis consistent with metastatic disease. IMPRESSION: Changes consistent with bony metastatic disease. These are stable from prior PET-CT. No acute abnormality noted. Electronically Signed   By: Alcide Clever M.D.   On: 07/22/2023 02:50    EKG: Independently reviewed.  Sinus tachycardia, no acute ST changes  Assessment/Plan Principal Problem:   Colitis Active Problems:   Adenocarcinoma (HCC)  (please populate well all problems here in Problem List. (For example, if patient is on BP meds at home and you resume or decide to hold them, it is a problem that needs to be her. Same for CAD, COPD, HLD and so on)  Probably early sepsis -Patient has partial SIRS sign with  a tachycardia, but given the patient currently on chemotherapy and immune compromised, not showing a full blown SIRS, but does have source infection acute colitis/diverticulitis -Plan to continue fluid resuscitation -Continue IV antibiotics to cover Colitis/with ceftriaxone and Flagyl -GI pathogen and C diff study pending -Had a normal colonoscopy April of this year, outpatient follow-up in 6 weeks with GI to discuss about indication for repeat colonoscopy  LLQ pain -Most likely secondary to colitis/diverticulitis -Pelvic ultrasound showed no signs of left adnexal mass torsion  Hypokalemia -P.o. 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 -Currently on hydration to  correct volume status, recheck sodium level this afternoon  Abnormal LFTs -Significant increase of alk phos, suspect this is related to metastatic lesions to the bone   Cardiomegaly and small pericardial effusion -Patient denied any shortness of breath -Volume status wise patient is volume depleted -Check proBNP, recommend outpatient follow-up with cardiology for echocardiogram  HTN -Blood pressure borderline low and volume contracted, will hold off home BP meds -As needed hydralazine  Stage IV metastatic adenocarcinoma, 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 Consults called: None Admission status: Telemetry admission   Emeline General MD [facility] Pager [phone]  07/22/2023, 7:37 AM

## 2023-07-22 NOTE — ED Triage Notes (Signed)
Pt arrives with reports of generalized weakness, emesis and diarrhea for the last several days. Pt taking chemo and radiation twice a week. Pt reports having issues with her sodium at her last blood draw.

## 2023-07-23 DIAGNOSIS — K529 Noninfective gastroenteritis and colitis, unspecified: Secondary | ICD-10-CM | POA: Diagnosis not present

## 2023-07-23 LAB — COMPREHENSIVE METABOLIC PANEL
ALT: 33 U/L (ref 0–44)
AST: 27 U/L (ref 15–41)
Albumin: 2.9 g/dL — ABNORMAL LOW (ref 3.5–5.0)
Alkaline Phosphatase: 247 U/L — ABNORMAL HIGH (ref 38–126)
Anion gap: 9 (ref 5–15)
BUN: 12 mg/dL (ref 6–20)
CO2: 18 mmol/L — ABNORMAL LOW (ref 22–32)
Calcium: 7.7 mg/dL — ABNORMAL LOW (ref 8.9–10.3)
Chloride: 103 mmol/L (ref 98–111)
Creatinine, Ser: 0.57 mg/dL (ref 0.44–1.00)
GFR, Estimated: >60 mL/min (ref >60)
Glucose, Bld: 85 mg/dL (ref 70–99)
Potassium: 3.5 mmol/L (ref 3.5–5.1)
Sodium: 130 mmol/L — ABNORMAL LOW (ref 135–145)
Total Bilirubin: 0.9 mg/dL (ref 0.3–1.2)
Total Protein: 5.9 g/dL — ABNORMAL LOW (ref 6.5–8.1)

## 2023-07-23 LAB — CBC
HCT: 25.7 % — ABNORMAL LOW (ref 36.0–46.0)
Hemoglobin: 8.9 g/dL — ABNORMAL LOW (ref 12.0–15.0)
MCH: 32.1 pg (ref 26.0–34.0)
MCHC: 34.6 g/dL (ref 30.0–36.0)
MCV: 92.8 fL (ref 80.0–100.0)
Platelets: 51 10*3/uL — ABNORMAL LOW (ref 150–400)
RBC: 2.77 MIL/uL — ABNORMAL LOW (ref 3.87–5.11)
RDW: 15 % (ref 11.5–15.5)
WBC: 5.3 10*3/uL (ref 4.0–10.5)
nRBC: 0 % (ref 0.0–0.2)

## 2023-07-23 MED ORDER — OXYCODONE HCL 5 MG PO TABS: 5.0000 mg | ORAL_TABLET | ORAL | Status: DC | PRN

## 2023-07-23 MED ORDER — OXYCODONE-ACETAMINOPHEN 10-325 MG PO TABS: 1.0000 | ORAL_TABLET | ORAL | Status: DC | PRN

## 2023-07-23 MED ORDER — DOXEPIN HCL 3 MG PO TABS: 1.0000 | ORAL_TABLET | Freq: Every day | ORAL | Status: DC

## 2023-07-23 MED ORDER — BUSPIRONE HCL 10 MG PO TABS
5.0000 mg | ORAL_TABLET | Freq: Two times a day (BID) | ORAL | Status: DC
  Administered 2023-07-23 – 2023-08-03 (×20): 5 mg via ORAL
  Filled 2023-07-23 (×23): qty 1

## 2023-07-23 MED ORDER — ALBUTEROL SULFATE (2.5 MG/3ML) 0.083% IN NEBU
2.5000 mg | INHALATION_SOLUTION | RESPIRATORY_TRACT | Status: DC | PRN
  Administered 2023-07-23 – 2023-07-24 (×2): 2.5 mg via RESPIRATORY_TRACT
  Filled 2023-07-23 (×2): qty 3

## 2023-07-23 MED ORDER — MORPHINE SULFATE (PF) 2 MG/ML IV SOLN
2.0000 mg | INTRAVENOUS | Status: DC | PRN
  Administered 2023-07-23 – 2023-07-24 (×5): 2 mg via INTRAVENOUS
  Filled 2023-07-23 (×5): qty 1

## 2023-07-23 MED ORDER — VITAMIN B-12 1000 MCG PO TABS
500.0000 ug | ORAL_TABLET | Freq: Every day | ORAL | Status: DC
  Administered 2023-07-23 – 2023-08-03 (×9): 500 ug via ORAL
  Filled 2023-07-23 (×11): qty 1

## 2023-07-23 MED ORDER — MORPHINE SULFATE (PF) 2 MG/ML IV SOLN: 2.0000 mg | Freq: Four times a day (QID) | INTRAVENOUS | Status: DC | PRN

## 2023-07-23 MED ORDER — TRAMADOL HCL 50 MG PO TABS
50.0000 mg | ORAL_TABLET | Freq: Two times a day (BID) | ORAL | Status: DC | PRN
  Administered 2023-07-24 – 2023-08-03 (×3): 50 mg via ORAL
  Filled 2023-07-23 (×3): qty 1

## 2023-07-23 MED ORDER — TRAMADOL HCL 50 MG PO TABS: 50.0000 mg | ORAL_TABLET | Freq: Four times a day (QID) | ORAL | Status: DC | PRN

## 2023-07-23 MED ORDER — LOPERAMIDE HCL 2 MG PO CAPS
2.0000 mg | ORAL_CAPSULE | Freq: Three times a day (TID) | ORAL | Status: DC | PRN
  Administered 2023-07-23 – 2023-07-24 (×2): 2 mg via ORAL
  Filled 2023-07-23 (×2): qty 1

## 2023-07-23 MED ORDER — MORPHINE SULFATE (PF) 2 MG/ML IV SOLN
2.0000 mg | Freq: Four times a day (QID) | INTRAVENOUS | Status: DC | PRN
  Filled 2023-07-23: qty 2

## 2023-07-23 MED ORDER — OXYCODONE-ACETAMINOPHEN 5-325 MG PO TABS
1.0000 | ORAL_TABLET | ORAL | Status: DC | PRN
  Administered 2023-07-23: 1 via ORAL
  Filled 2023-07-23 (×2): qty 1

## 2023-07-23 NOTE — Progress Notes (Signed)
[facility]  - [place] [facility]   PATIENT NAME: Sheryl Silva    MR#:  [id number]  DATE OF BIRTH:  [birth year]  SUBJECTIVE:   No family at bedside. Patient came in with significant abdominal pain. She has chronic pain from her metastatic cancer. Has some Ronnie stools. Denies any bloodied diarrhea. No vomiting. Tolerating liquid diet.   VITALS:  Blood pressure 118/82, pulse 99, temperature 98.5 F (36.9 C), temperature source Oral, resp. rate 16, SpO2 100%.  PHYSICAL EXAMINATION:   GENERAL:  47 y.o.-year-old patient with no acute distress.  LUNGS: Normal breath sounds bilaterally, no wheezing CARDIOVASCULAR: S1, S2 normal. No murmur   ABDOMEN: Soft, nontender, nondistended. Bowel sounds present.  EXTREMITIES: No  edema b/l.    NEUROLOGIC: nonfocal  patient is alert and awake SKIN: No obvious rash, lesion, or ulcer.   LABORATORY PANEL:  CBC Recent Labs  Lab 07/23/23 0326  WBC 5.3  HGB 8.9*  HCT 25.7*  PLT 51*    Chemistries  Recent Labs  Lab 07/20/23 1515 07/22/23 0225 07/23/23 0326  NA 127*   < > 130*  K 3.4*   < > 3.5  CL 101   < > 103  CO2 20*   < > 18*  GLUCOSE 142*   < > 85  BUN 24*   < > 12  CREATININE 0.53   < > 0.57  CALCIUM 8.6*   < > 7.7*  MG 2.0  --   --   AST 81*   < > 27  ALT 105*   < > 33  ALKPHOS 367*   < > 247*  BILITOT 0.7   < > 0.9   < > = values in this interval not displayed.   Cardiac Enzymes No results for input(s): "TROPONINI" in the last 168 hours. RADIOLOGY:  US PELVIC COMPLETE W TRANSVAGINAL AND TORSION R/O  Result Date: 07/22/2023 CLINICAL DATA:  [id number] with pelvic pain for 5 days. History of metastatic adrenal carcinoma and steadily enlarging left adnexal/pelvic mass on CT with a normal left ovary not able to be distinguished from it. There was also evidence of distal descending and proximal to mid sigmoid colitis/diverticulitis on CT today. EXAM: TRANSABDOMINAL AND TRANSVAGINAL ULTRASOUND OF PELVIS  DOPPLER ULTRASOUND OF OVARIES TECHNIQUE: Both transabdominal and transvaginal ultrasound examinations of the pelvis were performed. Transabdominal technique was performed for global imaging of the pelvis including uterus, ovaries, adnexal regions, and pelvic cul-de-sac. It was necessary to proceed with endovaginal exam following the transabdominal exam to visualize the uterus, right ovary and endometrium better. Color and duplex Doppler ultrasound was utilized to evaluate blood flow to the ovaries. COMPARISON:  CT with IV contrast today, PET-CT 07/06/2023, and earlier CT studies from this year back to 12/18/2022. FINDINGS: Uterus Measurements: Anteverted and heterogeneous measuring 7.2 x 3.8 x 4.3 cm = volume: 61.2 mL. No fibroids or other mass visualized. Incidentally noted is a 2 cm simple nabothian cysts in the cervix. Despite myometrial heterogeneity there is no appreciable focal wall mass. Endometrium Thickness: 5.7 mm.  No focal abnormality visualized. Right ovary Measurements: 3.0 x 1.4 x 2.0 = volume: 4.3 mL. Normal appearance/no adnexal mass. Left ovary There is a heterogeneous hypervascular left adnexal mass which is inseparable and indistinguishable from the left ovary. On CT this measured 5.4 x 4.8 cm. Ultrasound is 5.5 x 4.8 x 5.2 cm and 72 mL. Given steady enlargement over serial studies from this year it is probably either a primary or  more likely metastatic neoplasm in this patient with known stage IV disease with extensive bone metastases and multifocal adenopathy. Pulsed Doppler evaluation of both ovaries demonstrates normal low-resistance arterial and venous waveforms. Other findings Mild anechoic free fluid, which was also seen on CT. IMPRESSION: Heterogeneous hypervascular left adnexal mass which is inseparable from the left ovary. Given steady enlargement over serial studies from this year it is probably either a primary or more likely metastatic neoplasm in this patient with known stage IV  disease with extensive bone metastases and multifocal adenopathy. Right ovary, uterus and endometrium are essentially unremarkable with incidental 2 cm simple nabothian cervical cyst. Mildly heterogeneous myometrium. Mild free fluid, nonspecific. Electronically Signed   By: Almira Bar M.D.   On: 07/22/2023 06:01   CT Angio Chest PE W and/or Wo Contrast  Result Date: 07/22/2023 CLINICAL DATA:  Weakness, emesis and diarrhea for the last several days. Currently taking chemotherapy and radiation for metastatic adrenal carcinoma. EXAM: CT ANGIOGRAPHY CHEST CT ABDOMEN AND PELVIS WITH CONTRAST TECHNIQUE: Multidetector CT imaging of the chest was performed using the standard protocol during bolus administration of intravenous contrast. Multiplanar CT image reconstructions and MIPs were obtained to evaluate the vascular anatomy. Multidetector CT imaging of the abdomen and pelvis was performed using the standard protocol during bolus administration of intravenous contrast. RADIATION DOSE REDUCTION: This exam was performed according to the departmental dose-optimization program which includes automated exposure control, adjustment of the mA and/or kV according to patient size and/or use of iterative reconstruction technique. CONTRAST:  OMNIPAQUE IOHEXOL 350 MG/ML SOLN COMPARISON:  Portable chest today, PET-CT 07/06/2023, chest, abdomen and pelvis outside CT images only dated 02/03/2023. FINDINGS: CTA CHEST FINDINGS Cardiovascular: Pulmonary arteries are normal in caliber and do not show embolic filling defects. There is minimal atherosclerosis in the aortic arch. There is no aneurysm, stenosis or dissection. The great vessels are clear.  The pulmonary veins are nondistended. There is mild cardiomegaly with a left chamber predominance. There is a right chest port with IJ approach catheter with tip in the right atrium. There is a small pericardial effusion new from 07/06/2023. No visible coronary calcific plaques.  Mediastinum/Nodes: Lower paratracheal space node measuring 8 mm in short axis on 4:45 and adjacent right-sided precarinal lymph node also 8 mm short axis. Subcarinal lymph node up to 9 mm short axis. All of these with uptake on PET-CT but no bulky adenopathy. No hilar adenopathy. In the left axilla multiple enlarged lymph nodes again are noted, with multifocal PET activity. Largest of these is 1.4 x 3.3 cm on 4:17, 2 x 1.5 cm on 4:20 and 2 x 1.5 cm on 4:31. There has been no change since 07/06/2023. There is an enlarged right axillary node measuring 1.3 x 1.8 cm on 4:34. Right cardiophrenic angle lymph nodes largest of these is 9 mm in short axis on 4:95. Lungs/Pleura: Mildly elevated right hemidiaphragm. Unchanged 6 mm pleural-based posterior left upper lobe nodule on 5:36. Unchanged 5 mm pleural-based nodule anteriorly in the left upper lobe on 5:49. There are no other appreciable nodules. There is no consolidation, effusion or pneumothorax. 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 There are scattered thoracic spine endplate Schmorl's nodes but no acute compression fracture. Findings are more confluent and widespread than on 02/03/2023. Review of the MIP images confirms the above findings. CT ABDOMEN and PELVIS FINDINGS Hepatobiliary: No hepatic mass enhancement. Mild hepatic steatosis. Cholelithiasis without evidence of cholecystitis or biliary dilatation. Pancreas: No abnormality. Spleen: No  abnormality. 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 Both kidneys enhance homogeneously. There is no urinary stone or obstruction. No bladder thickening. Stomach/Bowel: There is fluid in the colon in the ascending and transverse segments. There are increasing thickened folds in the distal descending and proximal/mid sigmoid colon and adjacent stranding consistent with colitis or diverticulitis. There is no wall pneumatosis, free air, or  abscess. Vascular/Lymphatic: Small hypermetabolic lymph nodes were noted on the recent PET-CT and have not changed. Examples include an aortocaval lymph node 7 mm short axis on 11: 39, left external iliac chain node is 9 mm short axis on 11:77, with scattered subcentimeter hypermetabolic inguinal lymph nodes also recently seen. By strict size criteria, majority of the hypermetabolic nodes are not frankly enlarged. There are slightly prominent left periaortic chain nodes up to 1 cm in short axis. Large heterogeneous left adnexal/pelvic mass again measures 5.4 x 4.8 cm on 11:72. This is about double the size it was on an earlier study this year on 12/18/2022. Reproductive: The uterus is intact. No new adnexal abnormality. 2 cm transmural fibroid of the right side of the fundus. Other: Small volume of pelvic ascites. No free hemorrhage, free air or localizing collections. 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 No visible pathologic regional fracture. Review of the MIP images confirms the above findings. IMPRESSION: 1. No evidence of arterial embolus or acute chest process. 2. Small pericardial effusion, new from 07/06/2023. 3. Cardiomegaly without evidence of CHF. 4. Unchanged 6 mm and 5 mm left upper lobe nodules. 5. Bilateral axillary adenopathy, left greater than right, with borderline to slightly prominent mediastinal, retroperitoneal and pelvic lymph nodes. Positive PET activity on recent PET-CT. 6. 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 7. Increasing thickened folds in the distal descending and proximal/mid sigmoid colon with adjacent stranding consistent with colitis or diverticulitis. No wall pneumatosis, free air, or abscess. 8. 5.4 x 4.8 cm heterogeneous left adnexal/pelvic mass almost certainly metastatic, about double the size it was on an earlier study this year on 12/18/2022. 9. Small volume of pelvic ascites.  No free  air. 10. Cholelithiasis without evidence of cholecystitis. 11. Mild hepatic steatosis. Electronically Signed   By: Almira Bar M.D.   On: 07/22/2023 04:40   CT ABDOMEN PELVIS W CONTRAST  Result Date: 07/22/2023 CLINICAL DATA:  Weakness, emesis and diarrhea for the last several days. Currently taking chemotherapy and radiation for metastatic adrenal carcinoma. EXAM: CT ANGIOGRAPHY CHEST CT ABDOMEN AND PELVIS WITH CONTRAST TECHNIQUE: Multidetector CT imaging of the chest was performed using the standard protocol during bolus administration of intravenous contrast. Multiplanar CT image reconstructions and MIPs were obtained to evaluate the vascular anatomy. Multidetector CT imaging of the abdomen and pelvis was performed using the standard protocol during bolus administration of intravenous contrast. RADIATION DOSE REDUCTION: This exam was performed according to the departmental dose-optimization program which includes automated exposure control, adjustment of the mA and/or kV according to patient size and/or use of iterative reconstruction technique. CONTRAST:  OMNIPAQUE IOHEXOL 350 MG/ML SOLN COMPARISON:  Portable chest today, PET-CT 07/06/2023, chest, abdomen and pelvis outside CT images only dated 02/03/2023. FINDINGS: CTA CHEST FINDINGS Cardiovascular: Pulmonary arteries are normal in caliber and do not show embolic filling defects. There is minimal atherosclerosis in the aortic arch. There is no aneurysm, stenosis or dissection. The great vessels are clear.  The pulmonary veins are nondistended. There is mild cardiomegaly with a left chamber predominance. There  is a right chest port with IJ approach catheter with tip in the right atrium. There is a small pericardial effusion new from 07/06/2023. No visible coronary calcific plaques. Mediastinum/Nodes: Lower paratracheal space node measuring 8 mm in short axis on 4:45 and adjacent right-sided precarinal lymph node also 8 mm short axis. Subcarinal  lymph node up to 9 mm short axis. All of these with uptake on PET-CT but no bulky adenopathy. No hilar adenopathy. In the left axilla multiple enlarged lymph nodes again are noted, with multifocal PET activity. Largest of these is 1.4 x 3.3 cm on 4:17, 2 x 1.5 cm on 4:20 and 2 x 1.5 cm on 4:31. There has been no change since 07/06/2023. There is an enlarged right axillary node measuring 1.3 x 1.8 cm on 4:34. Right cardiophrenic angle lymph nodes largest of these is 9 mm in short axis on 4:95. Lungs/Pleura: Mildly elevated right hemidiaphragm. Unchanged 6 mm pleural-based posterior left upper lobe nodule on 5:36. Unchanged 5 mm pleural-based nodule anteriorly in the left upper lobe on 5:49. There are no other appreciable nodules. There is no consolidation, effusion or pneumothorax. 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 There are scattered thoracic spine endplate Schmorl's nodes but no acute compression fracture. Findings are more confluent and widespread than on 02/03/2023. Review of the MIP images confirms the above findings. CT ABDOMEN and PELVIS FINDINGS Hepatobiliary: No hepatic mass enhancement. Mild hepatic steatosis. Cholelithiasis without evidence of cholecystitis or biliary dilatation. Pancreas: No abnormality. Spleen: No abnormality. 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 Both kidneys enhance homogeneously. There is no urinary stone or obstruction. No bladder thickening. Stomach/Bowel: There is fluid in the colon in the ascending and transverse segments. There are increasing thickened folds in the distal descending and proximal/mid sigmoid colon and adjacent stranding consistent with colitis or diverticulitis. There is no wall pneumatosis, free air, or abscess. Vascular/Lymphatic: Small hypermetabolic lymph nodes were noted on the recent PET-CT and have not changed. Examples include an aortocaval lymph node 7 mm  short axis on 11: 39, left external iliac chain node is 9 mm short axis on 11:77, with scattered subcentimeter hypermetabolic inguinal lymph nodes also recently seen. By strict size criteria, majority of the hypermetabolic nodes are not frankly enlarged. There are slightly prominent left periaortic chain nodes up to 1 cm in short axis. Large heterogeneous left adnexal/pelvic mass again measures 5.4 x 4.8 cm on 11:72. This is about double the size it was on an earlier study this year on 12/18/2022. Reproductive: The uterus is intact. No new adnexal abnormality. 2 cm transmural fibroid of the right side of the fundus. Other: Small volume of pelvic ascites. No free hemorrhage, free air or localizing collections. 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 No visible pathologic regional fracture. Review of the MIP images confirms the above findings. IMPRESSION: 1. No evidence of arterial embolus or acute chest process. 2. Small pericardial effusion, new from 07/06/2023. 3. Cardiomegaly without evidence of CHF. 4. Unchanged 6 mm and 5 mm left upper lobe nodules. 5. Bilateral axillary adenopathy, left greater than right, with borderline to slightly prominent mediastinal, retroperitoneal and pelvic lymph nodes. Positive PET activity on recent PET-CT. 6. 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 7. Increasing thickened folds in the distal descending and proximal/mid sigmoid colon with adjacent stranding consistent with colitis or diverticulitis. No wall pneumatosis, free air, or abscess. 8. 5.4 x 4.8 cm heterogeneous left adnexal/pelvic  mass almost certainly metastatic, about double the size it was on an earlier study this year on 12/18/2022. 9. Small volume of pelvic ascites.  No free air. 10. Cholelithiasis without evidence of cholecystitis. 11. Mild hepatic steatosis. Electronically Signed   By: Almira Bar M.D.   On: 07/22/2023 04:40   DG  Chest Port 1 View  Result Date: 07/22/2023 CLINICAL DATA:  Generalized weakness for several days, history of adrenal carcinoma EXAM: PORTABLE CHEST 1 VIEW COMPARISON:  PET-CT from 07/06/2019 FINDINGS: Cardiac shadow is within normal limits. Lungs are well aerated bilaterally. Right chest wall port is noted. Bony structures demonstrate increased sclerosis consistent with metastatic disease. IMPRESSION: Changes consistent with bony metastatic disease. These are stable from prior PET-CT. No acute abnormality noted. Electronically Signed   By: Alcide Clever M.D.   On: 07/22/2023 02:50    Assessment and Plan  Sheryl Silva is a 47 y.o. 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  Patient was diagnosed with stage IV metastatic cancer initially was with on no arranging, pathology from axillary lymph node showed possible GI source.  Patient was started on chemo and radiation therapy with [id number] q. 14 days and so far she completed 7 cycles.  She said that with each cycle of chemotherapy she has had mild diarrhea but was never severe and or self-limiting.  Last chemotherapy and radiation was 9/16-17 Monday, patient started develop severe cramping pain in the left lower quadrant for 2 days then started to have a severe diarrhea,   CTA negative for PE, extensive evidence of metastatic cancer involving the bones, lymph nodes.  Acute colitis versus diverticulitis descending and sigmoid colon.  Left adnexal pelvic mass.  Pelvic ultrasound showed heterogeneous hypervascular left adnexal mass probably metastatic in nature.   Sepsis due to acute colitis -- patient came in with abdominal pain was found to be tachycardic tachypnea with abnormal CT scan -- IV fluids -- clear liquid diet advance as tolerated -- IV and PO PRN pain meds -- IV  antibiotics Rocephin and Flagyl -- white count normal, lactic acid normal -- PRN Imodium for diarrhea --BC negative  Hypokalemia -- replace orally  Hyponatremia -- likely from diarrhea and poor PO intake -- sodium improving  Cardiomegaly and small pericardial effusion -Patient denied any shortness of breath -Volume status wise patient is volume depleted  Stage IV metastatic signet ring adenocarcinoma -- patient follows with Dr. Orlie Dakin -- patient getting chemotherapy. She finished radiation therapy  Chronic back pain PRN PO and IV pain meds  H/o Buerger's dz Tobacco use --stable    Procedures: Family communication :none Consults : CODE STATUS: full DVT Prophylaxis :lovenox Level of care: Telemetry Medical Status is: Inpatient Remains inpatient appropriate because: acute colitis    TOTAL TIME TAKING CARE OF THIS PATIENT: 35 minutes.  >50% time spent on counselling and coordination of care  Note: This dictation was prepared with Dragon dictation along with smaller phrase technology. Any transcriptional errors that result from this process are unintentional.  Enedina Finner M.D    [facility]   CC: Primary care physician; [name], MD

## 2023-07-24 DIAGNOSIS — K529 Noninfective gastroenteritis and colitis, unspecified: Secondary | ICD-10-CM | POA: Diagnosis not present

## 2023-07-24 MED ORDER — TRAZODONE HCL 50 MG PO TABS
50.0000 mg | ORAL_TABLET | Freq: Once | ORAL | Status: AC
  Administered 2023-07-24: 50 mg via ORAL
  Filled 2023-07-24: qty 1

## 2023-07-24 MED ORDER — BOOST PLUS PO LIQD
237.0000 mL | Freq: Three times a day (TID) | ORAL | Status: DC
  Administered 2023-07-24 – 2023-08-01 (×16): 237 mL via ORAL
  Filled 2023-07-24: qty 237

## 2023-07-24 MED ORDER — ADULT MULTIVITAMIN W/MINERALS CH
1.0000 | ORAL_TABLET | Freq: Every day | ORAL | Status: DC
  Administered 2023-07-24 – 2023-08-03 (×9): 1 via ORAL
  Filled 2023-07-24 (×10): qty 1

## 2023-07-24 MED ORDER — VITAMIN D (ERGOCALCIFEROL) 1.25 MG (50000 UNIT) PO CAPS
50000.0000 [IU] | ORAL_CAPSULE | ORAL | Status: DC
  Administered 2023-07-24 – 2023-07-31 (×2): 50000 [IU] via ORAL
  Filled 2023-07-24 (×2): qty 1

## 2023-07-24 MED ORDER — DULOXETINE HCL 30 MG PO CPEP
60.0000 mg | ORAL_CAPSULE | Freq: Every day | ORAL | Status: DC
  Administered 2023-07-25 – 2023-08-03 (×7): 60 mg via ORAL
  Filled 2023-07-24 (×9): qty 2

## 2023-07-24 MED ORDER — OXYCODONE-ACETAMINOPHEN 5-325 MG PO TABS: 1.0000 | ORAL_TABLET | ORAL | Status: DC | PRN

## 2023-07-24 MED ORDER — MORPHINE SULFATE (PF) 2 MG/ML IV SOLN
2.0000 mg | Freq: Four times a day (QID) | INTRAVENOUS | Status: DC | PRN
  Administered 2023-07-24 – 2023-07-25 (×4): 2 mg via INTRAVENOUS
  Filled 2023-07-24 (×4): qty 1

## 2023-07-24 MED ORDER — RISAQUAD PO CAPS
2.0000 | ORAL_CAPSULE | Freq: Every day | ORAL | Status: DC
  Administered 2023-07-24 – 2023-07-26 (×3): 2 via ORAL
  Filled 2023-07-24 (×5): qty 2

## 2023-07-24 MED ORDER — SODIUM CHLORIDE 0.9 % IV SOLN: INTRAVENOUS | Status: DC | PRN

## 2023-07-24 MED ORDER — DIPHENOXYLATE-ATROPINE 2.5-0.025 MG PO TABS
2.0000 | ORAL_TABLET | Freq: Four times a day (QID) | ORAL | Status: AC
  Administered 2023-07-24 – 2023-07-25 (×7): 2 via ORAL
  Filled 2023-07-24 (×6): qty 2

## 2023-07-24 MED ORDER — SODIUM CHLORIDE 0.9 % IV SOLN
3.0000 g | Freq: Four times a day (QID) | INTRAVENOUS | Status: DC
  Administered 2023-07-24 – 2023-07-25 (×5): 3 g via INTRAVENOUS
  Filled 2023-07-24 (×6): qty 8

## 2023-07-24 MED ORDER — OXYCODONE HCL 5 MG PO TABS: 5.0000 mg | ORAL_TABLET | ORAL | Status: DC | PRN

## 2023-07-24 MED ORDER — DEXTROSE 50 % IV SOLN
INTRAVENOUS | Status: AC
  Filled 2023-07-24: qty 50

## 2023-07-24 MED ORDER — AMOXICILLIN-POT CLAVULANATE 875-125 MG PO TABS: 1.0000 | ORAL_TABLET | Freq: Two times a day (BID) | ORAL | Status: DC

## 2023-07-24 MED ORDER — OXYCODONE-ACETAMINOPHEN 10-325 MG PO TABS: 1.0000 | ORAL_TABLET | ORAL | Status: DC | PRN

## 2023-07-24 NOTE — Progress Notes (Signed)
[facility]  - [place] at [facility]   PATIENT NAME: Sheryl Silva    MR#:  [id number]  DATE OF BIRTH:  1976/11/01  SUBJECTIVE:   No family at bedside. Patient came in with significant abdominal pain. She has chronic pain from her metastatic cancer. Has some runny non bloody stools.  Tolerating liquid diet. Discussed about advancing diet to soft  VITALS:  Blood pressure (!) 141/88, pulse 98, temperature 98.4 F (36.9 C), temperature source Oral, resp. rate 18, SpO2 100%.  PHYSICAL EXAMINATION:   GENERAL:  47 y.o.-year-old patient with no acute distress.  LUNGS: Normal breath sounds bilaterally, no wheezing CARDIOVASCULAR: S1, S2 normal. No murmur   ABDOMEN: Soft, nontender, nondistended. EXTREMITIES: No  edema b/l.    NEUROLOGIC: nonfocal  patient is alert and awake  LABORATORY PANEL:  CBC Recent Labs  Lab 07/23/23 0326  WBC 5.3  HGB 8.9*  HCT 25.7*  PLT 51*    Chemistries  Recent Labs  Lab 07/20/23 1515 07/22/23 0225 07/23/23 0326  NA 127*   < > 130*  K 3.4*   < > 3.5  CL 101   < > 103  CO2 20*   < > 18*  GLUCOSE 142*   < > 85  BUN 24*   < > 12  CREATININE 0.53   < > 0.57  CALCIUM 8.6*   < > 7.7*  MG 2.0  --   --   AST 81*   < > 27  ALT 105*   < > 33  ALKPHOS 367*   < > 247*  BILITOT 0.7   < > 0.9   < > = values in this interval not displayed.    RADIOLOGY:  No results found.  Assessment and Plan  Sheryl Silva is a 47 y.o. 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  Patient was diagnosed with stage IV metastatic cancer initially was with on no arranging, pathology from axillary lymph node showed possible GI source.  Patient was started on chemo and radiation therapy with mFLFOX6 q. 14 days and so far she completed 7 cycles.  She said that with each cycle of  chemotherapy she has had mild diarrhea but was never severe and or self-limiting.  Last chemotherapy and radiation was 9/16-17 Monday, patient started develop severe cramping pain in the left lower quadrant for 2 days then started to have a severe diarrhea,   CTA negative for PE, extensive evidence of metastatic cancer involving the bones, lymph nodes.  Acute colitis versus diverticulitis descending and sigmoid colon.  Left adnexal pelvic mass.  Pelvic ultrasound showed heterogeneous hypervascular left adnexal mass probably metastatic in nature.   Sepsis due to acute colitis -- patient came in with abdominal pain was found to be tachycardic tachypnea with abnormal CT scan -- IV fluids--d/c -- clear liquid diet advance as tolerated -- IV and PO PRN pain meds -- IV antibiotics Rocephin and Flagyl--change to po from am -- white count normal, lactic acid normal -- PRN Imodium for diarrhea --BC negative --stool studies negative  Hypokalemia -- replace orally  Hyponatremia -- likely from diarrhea and poor PO intake -- sodium improving  Cardiomegaly and small pericardial effusion -Patient denied any shortness of breath -Volume status wise patient is volume depleted  Stage IV metastatic signet ring adenocarcinoma -- patient follows with Dr. Orlie Dakin --  patient getting chemotherapy. She finished radiation therapy  Chronic back pain PRN PO and IV pain meds  H/o Buerger's dz Tobacco use --stable    Family communication :none Consults : CODE STATUS: full DVT Prophylaxis :lovenox Level of care: Telemetry Medical Status is: Inpatient Remains inpatient appropriate because: acute colitis    TOTAL TIME TAKING CARE OF THIS PATIENT: 35 minutes.  >50% time spent on counselling and coordination of care  Note: This dictation was prepared with Dragon dictation along with smaller phrase technology. Any transcriptional errors that result from this process are unintentional.  Enedina Finner M.D     [facility]   CC: Primary care physician; [name], MD

## 2023-07-24 NOTE — Plan of Care (Signed)

## 2023-07-24 NOTE — Consult Note (Signed)
Pharmacy Antibiotic Note  Sheryl Silva is a 47 y.o. female admitted on 07/22/2023 with  intra-abdominal infection . PMH significant for HTN, anxiety, TUD, Signet ring cell adenocarcinoma. CTA showed acute colitis versus diverticulitis descending and sigmoid colon. Pharmacy has been consulted for Unasyn dosing.  Plan: Day 3 of antibiotics Start Unasyn 3 g IV Q6H Continue to monitor renal function and follow culture results      Temp (24hrs), Avg:98.3 F (36.8 C), Min:98.1 F (36.7 C), Max:98.5 F (36.9 C)  Recent Labs  Lab 07/18/23 0829 07/19/23 1432 07/20/23 1515 07/22/23 0225 07/23/23 0326  WBC 7.5 6.5 5.4 5.8 5.3  CREATININE 0.65  --  0.53 0.54 0.57  LATICACIDVEN  --   --   --  1.0  --     Estimated Creatinine Clearance: 80 mL/min (by C-G formula based on SCr of 0.57 mg/dL).    Allergies  Allergen Reactions   Nifedipine Palpitations    Antimicrobials this admission: 9/20 Cefepime x1 9/20 Vancomycin x1  9/20 Ceftriaxone >> 9/22 9/20 Metronidazole >> 9/22 9/22 Unasyn >>  Dose adjustments this admission: N/A  Microbiology results: 9/20 BCx: [id number]  Thank you for allowing pharmacy to be a part of this patient's care.  Celene Squibb, PharmD Clinical Pharmacist 07/24/2023 6:47 PM

## 2023-07-25 DIAGNOSIS — K529 Noninfective gastroenteritis and colitis, unspecified: Secondary | ICD-10-CM | POA: Diagnosis not present

## 2023-07-25 DIAGNOSIS — K5150 Left sided colitis without complications: Secondary | ICD-10-CM

## 2023-07-25 MED ORDER — OXYCODONE HCL 5 MG PO TABS
5.0000 mg | ORAL_TABLET | ORAL | Status: DC | PRN
  Administered 2023-07-27 – 2023-08-04 (×6): 5 mg via ORAL
  Filled 2023-07-25 (×8): qty 1

## 2023-07-25 MED ORDER — POLYETHYLENE GLYCOL 3350 17 GM/SCOOP PO POWD
0.5000 | Freq: Once | ORAL | Status: AC
  Administered 2023-07-25: 127.5 g via ORAL
  Filled 2023-07-25: qty 255

## 2023-07-25 MED ORDER — MORPHINE SULFATE (PF) 2 MG/ML IV SOLN
2.0000 mg | INTRAVENOUS | Status: DC | PRN
  Administered 2023-07-25 – 2023-07-26 (×4): 2 mg via INTRAVENOUS
  Filled 2023-07-25 (×4): qty 1

## 2023-07-25 MED ORDER — OXYCODONE-ACETAMINOPHEN 5-325 MG PO TABS
1.0000 | ORAL_TABLET | ORAL | Status: DC | PRN
  Administered 2023-07-27 – 2023-07-28 (×3): 1 via ORAL
  Administered 2023-07-31 – 2023-08-04 (×9): 2 via ORAL
  Filled 2023-07-25: qty 1
  Filled 2023-07-25 (×2): qty 2
  Filled 2023-07-25 (×2): qty 1
  Filled 2023-07-25 (×7): qty 2
  Filled 2023-07-25 (×2): qty 1
  Filled 2023-07-25: qty 2

## 2023-07-25 NOTE — Progress Notes (Signed)
[facility]  - [place] at [facility]   PATIENT NAME: Sheryl Silva    MR#:  [id number]  DATE OF BIRTH:  1975/12/06  SUBJECTIVE:   No family at bedside. Patient came in with significant abdominal pain. She has chronic pain from her metastatic cancer.   No vomiting today. Runny stools today x2 Requesting GI see her VITALS:  Blood pressure 123/73, pulse (!) 108, temperature 97.9 F (36.6 C), resp. rate 16, SpO2 100%.  PHYSICAL EXAMINATION:   GENERAL:  47 y.o.-year-old patient with no acute distress.  LUNGS: Normal breath sounds bilaterally, no wheezing CARDIOVASCULAR: S1, S2 normal. No murmur   ABDOMEN: Soft, diffusely tender, nondistended. EXTREMITIES: No  edema b/l.    NEUROLOGIC: nonfocal  patient is alert and awake  LABORATORY PANEL:  CBC Recent Labs  Lab 07/23/23 0326  WBC 5.3  HGB 8.9*  HCT 25.7*  PLT 51*    Chemistries  Recent Labs  Lab 07/20/23 1515 07/22/23 0225 07/23/23 0326  NA 127*   < > 130*  K 3.4*   < > 3.5  CL 101   < > 103  CO2 20*   < > 18*  GLUCOSE 142*   < > 85  BUN 24*   < > 12  CREATININE 0.53   < > 0.57  CALCIUM 8.6*   < > 7.7*  MG 2.0  --   --   AST 81*   < > 27  ALT 105*   < > 33  ALKPHOS 367*   < > 247*  BILITOT 0.7   < > 0.9   < > = values in this interval not displayed.    RADIOLOGY:  No results found.  Assessment and Plan  Sheryl Silva is a 47 y.o. 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  Patient was diagnosed with stage IV metastatic cancer initially was with on no arranging, pathology from axillary lymph node showed possible GI source.  Patient was started on chemo and radiation therapy with mFLFOX6 q. 14 days and so far she completed 7 cycles.  She said that with each cycle of chemotherapy she has had mild diarrhea but was  never severe and or self-limiting.  Last chemotherapy and radiation was 9/16-17 Monday, patient started develop severe cramping pain in the left lower quadrant for 2 days then started to have a severe diarrhea,   CTA negative for PE, extensive evidence of metastatic cancer involving the bones, lymph nodes.  Acute colitis versus diverticulitis descending and sigmoid colon.  Left adnexal pelvic mass.  Pelvic ultrasound showed heterogeneous hypervascular left adnexal mass probably metastatic in nature.   Sepsis due to acute colitis -- patient came in with abdominal pain was found to be tachycardic tachypnea with abnormal CT scan -- IV fluids--d/c -- clear liquid diet advance as tolerated -- IV and PO PRN pain meds -- IV Unasyn -- white count normal, lactic acid normal -- PRN Imodium for diarrhea --BC negative --stool studies negative --GI consult with dr Allegra Lai  Hypokalemia -- replace orally  Hyponatremia -- likely from diarrhea and poor PO intake -- sodium improving  Cardiomegaly and small pericardial effusion -Patient denied any shortness of breath -Volume status wise patient is volume depleted  Stage IV metastatic signet ring adenocarcinoma -- patient follows with Dr. Orlie Dakin -- patient getting chemotherapy. She finished radiation therapy  Chronic back pain PRN PO and IV pain meds  H/o Buerger's dz Tobacco use --stable    Family communication :none Consults :GI CODE STATUS: full DVT Prophylaxis :lovenox Level of care: Telemetry Medical Status is: Inpatient Remains inpatient appropriate because: acute colitis    TOTAL TIME TAKING CARE OF THIS PATIENT: 35 minutes.  >50% time spent on counselling and coordination of care  Note: This dictation was prepared with Dragon dictation along with smaller phrase technology. Any transcriptional errors that result from this process are unintentional.  Enedina Finner M.D    [facility]   CC: Primary care physician;  [name], MD

## 2023-07-25 NOTE — Progress Notes (Signed)
Transition of Care [facility]) - Inpatient Brief Assessment   Patient Details  Name: Sheryl Silva MRN: [id number] Date of Birth: [birth year]  Transition of Care [facility]) CM/SW Contact:    Darolyn Rua, LCSW Phone Number: 07/25/2023, 2:07 PM   Clinical Narrative:  Patient presents from home with history of metastatic signet ring adenocarcinoma who presents to the emergency department with complaints of nausea, vomiting, diarrhea, shortness of breath and left-sided abdominal pain. Patient has a known left adnexal mass   Patient's last chemo 9/16 and last radiation 9/17  PCP: Dr. Marcello Fennel Insurance: [employer]  No identified TOC needs, please contact TOC should needs arise.    Transition of Care Asessment: Insurance and Status: Insurance coverage has been reviewed Patient has primary care physician: Yes Home environment has been reviewed: from home Prior level of function:: independent Prior/Current Home Services: No current home services Social Determinants of Health Reivew: SDOH reviewed no interventions necessary Readmission risk has been reviewed: Yes Transition of care needs: no transition of care needs at this time

## 2023-07-25 NOTE — Plan of Care (Signed)

## 2023-07-25 NOTE — Consult Note (Signed)
Arlyss Repress, MD [address]  [address]  [place], [place] [address]  Main: [phone]  Fax: [phone] Pager: [phone]   Consultation  Referring Provider:     No ref. provider found Primary Care Physician:  [name], MD Primary Gastroenterologist: Dr. [facility]        Reason for Consultation: Left-sided colitis  Date of Admission:  07/22/2023 Date of Consultation:  07/25/2023         HPI:   Sheryl Silva is a 46 y.o. female with history of metastatic signet ring cell adenocarcinoma likely GI origin, had extensive workup at [facility] currently on FOLFOX and Keytruda, received cycle 7 of treatment on 9/16 for 3 days along with XRT for 2 days.  Dr. Orlie Dakin is her primary oncologist.  She also has left adnexal mass, awaiting pathology results from biopsy.  She was found to have transaminitis which has resolved, normocytic anemia and thrombocytopenia.  Patient is admitted on 9/20 secondary to generalized weakness, nausea, vomiting, left lower quadrant pain and diarrhea that started after she received last cycle of chemo.  She reports that this is the first time she developed severe diarrhea after chemo.  Today, she had about 10 bowel movements, denies any blood in the stools or rectal bleeding.  Patient presented with dehydration, hypotensive, CT revealed thickening of the descending and sigmoid colon, infectious versus diverticulitis.  Stool studies were negative for infection.  She has been started on broad-spectrum antibiotics at the time of admission for possible sepsis.  GI is consulted for evaluation of colitis due to ongoing diarrhea Patient reports that over the last 7 months, she has been having generalized weakness, weight loss not able to efficiently perform her ADLs which is not her baseline because she is otherwise healthy and very active She had a colonoscopy in 01/2023 before initiation of chemotherapy  NSAIDs:  None  Antiplts/Anticoagulants/Anti thrombotics: None  GI Procedures: Colonoscopy 02/21/2023 - The examined portion of the ileum was normal. - The entire examined colon is normal on direct and retroflexion views. - No specimens collected.  Past Medical History:  Diagnosis Date   Anxiety    Cancer (HCC)    Depression    Heart murmur    Hypertension     Past Surgical History:  Procedure Laterality Date   CESAREAN SECTION     COLONOSCOPY WITH PROPOFOL N/A 02/21/2023   Procedure: COLONOSCOPY WITH PROPOFOL;  Surgeon: Regis Bill, MD;  Location: [facility] ENDOSCOPY;  Service: Endoscopy;  Laterality: N/A;   DILATION AND CURETTAGE OF UTERUS     IR IMAGING GUIDED PORT INSERTION  02/16/2023     Current Facility-Administered Medications:    0.9 %  sodium chloride infusion, , Intravenous, PRN, Enedina Finner, MD, Last Rate: 10 mL/hr at 07/25/23 0611, New Bag at 07/25/23 0611   acetaminophen (TYLENOL) tablet 650 mg, 650 mg, Oral, Q6H PRN, Mikey College T, MD, 650 mg at 07/25/23 0940   acidophilus (RISAQUAD) capsule 2 capsule, 2 capsule, Oral, Daily, Enedina Finner, MD, 2 capsule at 07/25/23 0928   albuterol (PROVENTIL) (2.5 MG/3ML) 0.083% nebulizer solution 2.5 mg, 2.5 mg, Nebulization, Q4H PRN, Manuela Schwartz, NP, 2.5 mg at 07/24/23 2001   Ampicillin-Sulbactam (UNASYN) 3 g in sodium chloride 0.9 % 100 mL IVPB, 3 g, Intravenous, Q6H, Enedina Finner, MD, Last Rate: 200 mL/hr at 07/25/23 1245, 3 g at 07/25/23 1245   busPIRone (BUSPAR) tablet 5 mg, 5 mg, Oral, BID, Enedina Finner, MD, 5  mg at 07/25/23 0981   cyanocobalamin (VITAMIN B12) tablet 500 mcg, 500 mcg, Oral, Daily, Enedina Finner, MD, 500 mcg at 07/25/23 1914   diphenoxylate-atropine (LOMOTIL) 2.5-0.025 MG per tablet 2 tablet, 2 tablet, Oral, QID, Enedina Finner, MD, 2 tablet at 07/25/23 7829   DULoxetine (CYMBALTA) DR capsule 60 mg, 60 mg, Oral, Daily, Enedina Finner, MD, 60 mg at 07/25/23 0928   enoxaparin (LOVENOX) injection 40 mg, 40 mg, Subcutaneous,  Q24H, Mikey College T, MD, 40 mg at 07/24/23 2036   gabapentin (NEURONTIN) capsule 300 mg, 300 mg, Oral, TID, Mikey College T, MD, 300 mg at 07/25/23 5621   hydrocortisone (ANUSOL-HC) 2.5 % rectal cream, , Topical, BID, Angelique Blonder, RPH, Given at 07/25/23 0930   lactobacillus (FLORANEX/LACTINEX) granules 1 g, 1 g, Oral, TID WC, Mikey College T, MD, 1 g at 07/25/23 1245   lactose free nutrition (BOOST PLUS) liquid 237 mL, 237 mL, Oral, TID WC, Enedina Finner, MD, 237 mL at 07/25/23 1248   megestrol (MEGACE) tablet 40 mg, 40 mg, Oral, Daily, Mikey College T, MD, 40 mg at 07/25/23 0930   morphine (PF) 2 MG/ML injection 2 mg, 2 mg, Intravenous, Q6H PRN, Enedina Finner, MD, 2 mg at 07/25/23 1257   multivitamin with minerals tablet 1 tablet, 1 tablet, Oral, Daily, Enedina Finner, MD, 1 tablet at 07/25/23 0928   ondansetron [facility]) injection 4 mg, 4 mg, Intravenous, Q6H PRN, Mikey College T, MD, 4 mg at 07/25/23 1244   oxyCODONE-acetaminophen (PERCOCET/ROXICET) 5-325 MG per tablet 1 tablet, 1 tablet, Oral, Q4H PRN **AND** oxyCODONE (Oxy IR/ROXICODONE) immediate release tablet 5 mg, 5 mg, Oral, Q4H PRN, Charlies Silvers, Howard F, RPH   pantoprazole (PROTONIX) EC tablet 40 mg, 40 mg, Oral, Daily, Chipper Herb, Ping T, MD, 40 mg at 07/25/23 3086   potassium chloride SA (KLOR-CON M) CR tablet 40 mEq, 40 mEq, Oral, Once, Mikey College T, MD   traMADol Janean Sark) tablet 50 mg, 50 mg, Oral, Q12H PRN, Enedina Finner, MD, 50 mg at 07/25/23 0250   Vitamin D (Ergocalciferol) (DRISDOL) 1.25 MG (50000 UNIT) capsule 50,000 Units, 50,000 Units, Oral, Q7 days, Enedina Finner, MD, 50,000 Units at 07/24/23 2034  Facility-Administered Medications Ordered in Other Encounters:    sodium chloride flush (NS) 0.9 % injection 10 mL, 10 mL, Intracatheter, PRN, Jeralyn Ruths, MD, 10 mL at 06/29/23 1430   Family History  Problem Relation Age of Onset   Cancer Maternal Grandmother      Social History   Tobacco Use   Smoking status: Former    Current  packs/day: 0.00    Types: Cigarettes    Quit date: 12/19/2022    Years since quitting: 0.5   Smokeless tobacco: Never   Tobacco comments:    Quit smoking 12/2022  Vaping Use   Vaping status: Never Used  Substance Use Topics   Alcohol use: Yes    Comment: occ   Drug use: No    Allergies as of 07/22/2023 - Review Complete 07/22/2023  Allergen Reaction Noted   Nifedipine Palpitations 01/24/2023    Review of Systems:    All systems reviewed and negative except where noted in HPI.   Physical Exam:  Vital signs in last 24 hours: Temp:  [97.9 F (36.6 C)-99.5 F (37.5 C)] 97.9 F (36.6 C) (09/23 0803) Pulse Rate:  [97-112] 108 (09/23 0803) Resp:  [16-18] 16 (09/23 0803) BP: (123-148)/(73-87) 123/73 (09/23 0803) SpO2:  [100 %] 100 % (09/23 0455) Last BM Date : 07/24/23 General:  Pleasant, cooperative in NAD Head:  Normocephalic and atraumatic. Eyes:   No icterus.   Conjunctiva pink. PERRLA. Ears:  Normal auditory acuity. Neck:  Supple; no masses or thyroidomegaly Lungs: Respirations even and unlabored. Lungs clear to auscultation bilaterally.   No wheezes, crackles, or rhonchi.  Heart:  Regular rate and rhythm;  Without murmur, clicks, rubs or gallops Abdomen:  Soft, nondistended, nontender. Normal bowel sounds. No appreciable masses or hepatomegaly.  No rebound or guarding.  Rectal:  Not performed. Msk:  Symmetrical without gross deformities.  Strength generalized weakness Extremities:  Without edema, cyanosis or clubbing. Neurologic:  Alert and oriented x3;  grossly normal neurologically. Skin:  Intact without significant lesions or rashes. Psych:  Alert and cooperative. Normal affect.  LAB RESULTS:    Latest Ref Rng & Units 07/23/2023    3:26 AM 07/22/2023    2:25 AM 07/20/2023    3:15 PM  CBC  WBC 4.0 - 10.5 K/uL 5.3  5.8  5.4   Hemoglobin 12.0 - 15.0 g/dL 8.9  32.4  9.9   Hematocrit 36.0 - 46.0 % 25.7  31.5  29.4   Platelets 150 - 400 K/uL 51  85  84      BMET    Latest Ref Rng & Units 07/23/2023    3:26 AM 07/22/2023    4:25 PM 07/22/2023    2:25 AM  BMP  Glucose 70 - 99 mg/dL 85   401   BUN 6 - 20 mg/dL 12   17   Creatinine 0.27 - 1.00 mg/dL 2.53   6.64   Sodium 403 - 145 mmol/L 130  129  128   Potassium 3.5 - 5.1 mmol/L 3.5   3.4   Chloride 98 - 111 mmol/L 103   99   CO2 22 - 32 mmol/L 18   19   Calcium 8.9 - 10.3 mg/dL 7.7   8.2     LFT    Latest Ref Rng & Units 07/23/2023    3:26 AM 07/22/2023    2:25 AM 07/20/2023    3:15 PM  Hepatic Function  Total Protein 6.5 - 8.1 g/dL 5.9  7.2  7.0   Albumin 3.5 - 5.0 g/dL 2.9  3.6  3.7   AST 15 - 41 U/L 27  37  81   ALT 0 - 44 U/L 33  55  105   Alk Phosphatase 38 - 126 U/L 247  348  367   Total Bilirubin 0.3 - 1.2 mg/dL 0.9  0.9  0.7      STUDIES: No results found.    Impression / Plan:   Sheryl Silva is a 47 y.o. 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  Left-sided colitis No evidence of infection based on stool studies ?  Keytruda induced checkpoint inhibitor mediated colitis or radiation-induced colitis Patient will need flexible sigmoidoscopy with biopsies to rule out immune mediated colitis If immune mediated colitis is confirmed, this will need to be treated with prednisone and Keytruda might need to be held Clear liquid diet today MiraLAX bowel prep Will plan for sigmoidoscopy tomorrow N.p.o. effective 5 AM tomorrow  I have discussed alternative options, risks & benefits,  which include, but are not limited to, bleeding, infection, perforation,respiratory complication & drug reaction.  The patient agrees with this plan & written consent will be obtained.     Thank you for involving me in the care of this  patient.      LOS: 3 days   Lannette Donath, MD  07/25/2023, 4:46 PM    Note: This dictation was prepared with Dragon dictation along with smaller phrase technology. Any transcriptional errors that result from this process are unintentional.

## 2023-07-26 ENCOUNTER — Inpatient Hospital Stay: Payer: 59 | Admitting: Anesthesiology

## 2023-07-26 ENCOUNTER — Encounter
Admission: EM | Disposition: A | Discharge status: Home / Self Care | Payer: Self-pay | Source: Home / Self Care | Attending: Internal Medicine

## 2023-07-26 ENCOUNTER — Encounter: Payer: Self-pay | Admitting: Internal Medicine

## 2023-07-26 DIAGNOSIS — Z515 Encounter for palliative care: Secondary | ICD-10-CM | POA: Diagnosis not present

## 2023-07-26 DIAGNOSIS — B259 Cytomegaloviral disease, unspecified: Secondary | ICD-10-CM | POA: Diagnosis not present

## 2023-07-26 DIAGNOSIS — K559 Vascular disorder of intestine, unspecified: Secondary | ICD-10-CM

## 2023-07-26 DIAGNOSIS — K529 Noninfective gastroenteritis and colitis, unspecified: Secondary | ICD-10-CM | POA: Diagnosis not present

## 2023-07-26 HISTORY — PX: FLEXIBLE SIGMOIDOSCOPY: SHX5431

## 2023-07-26 HISTORY — PX: BIOPSY: SHX5522

## 2023-07-26 SURGERY — SIGMOIDOSCOPY, FLEXIBLE
Anesthesia: General

## 2023-07-26 MED ORDER — PREDNISONE 20 MG PO TABS
40.0000 mg | ORAL_TABLET | Freq: Every day | ORAL | Status: DC
  Administered 2023-07-26 – 2023-07-27 (×2): 40 mg via ORAL
  Filled 2023-07-26: qty 2
  Filled 2023-07-26: qty 4

## 2023-07-26 MED ORDER — ONDANSETRON HCL 4 MG/2ML IJ SOLN
INTRAMUSCULAR | Status: DC | PRN
  Administered 2023-07-26: 4 mg via INTRAVENOUS

## 2023-07-26 MED ORDER — SODIUM CHLORIDE 0.9 % IV SOLN
INTRAVENOUS | Status: DC | PRN
Start: 2023-07-26 — End: 2023-07-26

## 2023-07-26 MED ORDER — DICYCLOMINE HCL 20 MG PO TABS
20.0000 mg | ORAL_TABLET | Freq: Three times a day (TID) | ORAL | Status: DC | PRN
  Administered 2023-07-27 – 2023-08-02 (×6): 20 mg via ORAL
  Filled 2023-07-26 (×8): qty 1

## 2023-07-26 MED ORDER — SODIUM CHLORIDE 0.9 % IV SOLN: INTRAVENOUS | Status: DC

## 2023-07-26 MED ORDER — TRAZODONE HCL 50 MG PO TABS
25.0000 mg | ORAL_TABLET | Freq: Every evening | ORAL | Status: DC | PRN
  Administered 2023-07-26 – 2023-08-02 (×5): 25 mg via ORAL
  Filled 2023-07-26 (×5): qty 1

## 2023-07-26 MED ORDER — MORPHINE SULFATE (PF) 4 MG/ML IV SOLN
4.0000 mg | INTRAVENOUS | Status: DC | PRN
  Administered 2023-07-26 – 2023-07-28 (×10): 4 mg via INTRAVENOUS
  Filled 2023-07-26 (×10): qty 1

## 2023-07-26 MED ORDER — PROPOFOL 500 MG/50ML IV EMUL
INTRAVENOUS | Status: DC | PRN
  Administered 2023-07-26 (×2): 50 mg via INTRAVENOUS
  Administered 2023-07-26: 150 ug/kg/min via INTRAVENOUS

## 2023-07-26 NOTE — Consult Note (Signed)
Palliative Medicine [facility] at [facility] [phone] [phone]   Name: Sheryl Silva Date: 07/26/2023 MRN: [id number]  DOB: [birth year]  Patient Care Team: [name], MD as PCP - General (Internal Medicine) Jim Like, RN as Registered Nurse Scarlett Presto, RN (Inactive) as Registered Nurse Benita Gutter, RN as Oncology Nurse Navigator Jeralyn Ruths, MD as Consulting Physician (Oncology)    REASON FOR CONSULTATION: Sheryl Silva is a 47 y.o. 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  Patient has been on regimen of FOLFOX plus Keytruda.  She was admitted to the hospital 07/22/2023 with abdominal pain.  CT of the abdomen and pelvis showed progressive mediastinal, retroperitoneal, and pelvic lymphadenopathy, diffuse osteoblastic metastatic disease, and left adnexal pelvic mass.  Patient also with findings of colitis.  Palliative care was consulted to address goals and manage ongoing symptoms.  SOCIAL HISTORY:     reports that she quit smoking about 7 months ago. Her smoking use included cigarettes. She has never used smokeless tobacco. She reports current alcohol use. She reports that she does not use drugs.  ADVANCE DIRECTIVES:  Not on file  CODE STATUS: Full code  PAST MEDICAL HISTORY: Past Medical History:  Diagnosis Date   Anxiety    Cancer (HCC)    Depression    Heart murmur    Hypertension     PAST SURGICAL HISTORY:  Past Surgical History:  Procedure Laterality Date   CESAREAN SECTION     COLONOSCOPY WITH PROPOFOL N/A 02/21/2023   Procedure: COLONOSCOPY WITH PROPOFOL;  Surgeon: Regis Bill, MD;  Location: [facility] ENDOSCOPY;  Service: Endoscopy;  Laterality: N/A;   DILATION AND CURETTAGE OF UTERUS     IR IMAGING GUIDED PORT INSERTION  02/16/2023    HEMATOLOGY/ONCOLOGY HISTORY:  Oncology History  Signet ring cell  adenocarcinoma (HCC)  02/07/2023 Cancer Staging   Staging form: Exocrine Pancreas, AJCC 8th Edition - Clinical stage from 02/07/2023: Stage IV (cTX, cNX, pM1) - Signed by Jeralyn Ruths, MD on 02/14/2023 Stage prefix: Initial diagnosis   02/14/2023 Initial Diagnosis   Signet ring cell adenocarcinoma   03/02/2023 -  Chemotherapy   Patient is on Treatment Plan : GI ORIGIN FOLFOX+KEYTRUDA q21d x12 followed by Gwenlyn Fudge       ALLERGIES:  is allergic to nifedipine.  MEDICATIONS:  Current Facility-Administered Medications  Medication Dose Route Frequency Provider Last Rate Last Admin   0.9 %  sodium chloride infusion   Intravenous PRN Enedina Finner, MD   Stopped at 07/25/23 1757   0.9 %  sodium chloride infusion   Intravenous Continuous Vanga, Loel Dubonnet, MD 20 mL/hr at 07/26/23 0900 Started During Downtime at 07/26/23 0900   0.9 %  sodium chloride infusion   Intravenous Continuous Vanga, Loel Dubonnet, MD       acetaminophen (TYLENOL) tablet 650 mg  650 mg Oral Q6H PRN Mikey College T, MD   650 mg at 07/26/23 1035   acidophilus (RISAQUAD) capsule 2 capsule  2 capsule Oral Daily Enedina Finner, MD   2 capsule at 07/26/23 0854   albuterol (PROVENTIL) (2.5 MG/3ML) 0.083% nebulizer solution 2.5 mg  2.5 mg Nebulization Q4H PRN Manuela Schwartz, NP   2.5 mg at 07/24/23 2001   busPIRone (BUSPAR) tablet 5 mg  5 mg Oral BID Enedina Finner, MD   5 mg at 07/26/23 0855   cyanocobalamin (VITAMIN B12) tablet 500 mcg  500 mcg  Oral Daily Enedina Finner, MD   500 mcg at 07/26/23 0855   DULoxetine (CYMBALTA) DR capsule 60 mg  60 mg Oral Daily Enedina Finner, MD   60 mg at 07/26/23 0856   enoxaparin (LOVENOX) injection 40 mg  40 mg Subcutaneous Q24H Mikey College T, MD   40 mg at 07/25/23 2039   gabapentin (NEURONTIN) capsule 300 mg  300 mg Oral TID Mikey College T, MD   300 mg at 07/26/23 0855   hydrocortisone (ANUSOL-HC) 2.5 % rectal cream   Topical BID Angelique Blonder, [facility]   Given at 07/26/23 0856   lactobacillus  (FLORANEX/LACTINEX) granules 1 g  1 g Oral TID WC Mikey College T, MD   1 g at 07/26/23 0854   lactose free nutrition (BOOST PLUS) liquid 237 mL  237 mL Oral TID WC Enedina Finner, MD   237 mL at 07/25/23 1725   megestrol (MEGACE) tablet 40 mg  40 mg Oral Daily Mikey College T, MD   40 mg at 07/26/23 0853   morphine (PF) 4 MG/ML injection 4 mg  4 mg Intravenous Q4H PRN Lillianna Sabel, Daryl Eastern, NP   4 mg at 07/26/23 1304   multivitamin with minerals tablet 1 tablet  1 tablet Oral Daily Enedina Finner, MD   1 tablet at 07/26/23 0855   ondansetron (ZOFRAN) injection 4 mg  4 mg Intravenous Q6H PRN Mikey College T, MD   4 mg at 07/26/23 1028   oxyCODONE-acetaminophen (PERCOCET/ROXICET) 5-325 MG per tablet 1-2 tablet  1-2 tablet Oral Q4H PRN Enedina Finner, MD       And   oxyCODONE (Oxy IR/ROXICODONE) immediate release tablet 5 mg  5 mg Oral Q4H PRN Enedina Finner, MD       pantoprazole (PROTONIX) EC tablet 40 mg  40 mg Oral Daily Mikey College T, MD   40 mg at 07/26/23 0855   potassium chloride SA (KLOR-CON M) CR tablet 40 mEq  40 mEq Oral Once Mikey College T, MD       traMADol Janean Sark) tablet 50 mg  50 mg Oral Q12H PRN Enedina Finner, MD   50 mg at 07/25/23 0250   Vitamin D (Ergocalciferol) (DRISDOL) 1.25 MG (50000 UNIT) capsule 50,000 Units  50,000 Units Oral Q7 days Enedina Finner, MD   50,000 Units at 07/24/23 2034   Facility-Administered Medications Ordered in Other Encounters  Medication Dose Route Frequency Provider Last Rate Last Admin   sodium chloride flush (NS) 0.9 % injection 10 mL  10 mL Intracatheter PRN Jeralyn Ruths, MD   10 mL at 06/29/23 1430    VITAL SIGNS: BP (!) 138/90 (BP Location: Right Arm)   Pulse (!) 108   Temp 98.3 F (36.8 C) (Oral)   Resp 20   LMP  (LMP Unknown)   SpO2 100%  There were no vitals filed for this visit.  Estimated body mass index is 34 kg/m as calculated from the following:   Height as of 06/16/23: 5' (1.524 m).   Weight as of 07/18/23: 174 lb 1.6 oz (79 kg).  LABS: CBC:     Component Value Date/Time   WBC 5.3 07/23/2023 0326   HGB 8.9 (L) 07/23/2023 0326   HGB 9.4 (L) 07/19/2023 1432   HCT 25.7 (L) 07/23/2023 0326   PLT 51 (L) 07/23/2023 0326   PLT 97 (L) 07/19/2023 1432   MCV 92.8 07/23/2023 0326   NEUTROABS 3.9 07/22/2023 0225   LYMPHSABS 0.4 (L) 07/22/2023 0225   MONOABS 0.1  07/22/2023 0225   EOSABS 0.1 07/22/2023 0225   BASOSABS 0.0 07/22/2023 0225   Comprehensive Metabolic Panel:    Component Value Date/Time   NA 130 (L) 07/23/2023 0326   K 3.5 07/23/2023 0326   CL 103 07/23/2023 0326   CO2 18 (L) 07/23/2023 0326   BUN 12 07/23/2023 0326   CREATININE 0.57 07/23/2023 0326   CREATININE 0.65 07/18/2023 0829   GLUCOSE 85 07/23/2023 0326   CALCIUM 7.7 (L) 07/23/2023 0326   AST 27 07/23/2023 0326   AST 106 (H) 07/18/2023 0829   ALT 33 07/23/2023 0326   ALT 113 (H) 07/18/2023 0829   ALKPHOS 247 (H) 07/23/2023 0326   BILITOT 0.9 07/23/2023 0326   BILITOT 0.5 07/18/2023 0829   PROT 5.9 (L) 07/23/2023 0326   ALBUMIN 2.9 (L) 07/23/2023 0326    RADIOGRAPHIC STUDIES: US PELVIC COMPLETE W TRANSVAGINAL AND TORSION R/O  Result Date: 07/22/2023 CLINICAL DATA:  [id number] with pelvic pain for 5 days. History of metastatic adrenal carcinoma and steadily enlarging left adnexal/pelvic mass on CT with a normal left ovary not able to be distinguished from it. There was also evidence of distal descending and proximal to mid sigmoid colitis/diverticulitis on CT today. EXAM: TRANSABDOMINAL AND TRANSVAGINAL ULTRASOUND OF PELVIS DOPPLER ULTRASOUND OF OVARIES TECHNIQUE: Both transabdominal and transvaginal ultrasound examinations of the pelvis were performed. Transabdominal technique was performed for global imaging of the pelvis including uterus, ovaries, adnexal regions, and pelvic cul-de-sac. It was necessary to proceed with endovaginal exam following the transabdominal exam to visualize the uterus, right ovary and endometrium better. Color and duplex Doppler  ultrasound was utilized to evaluate blood flow to the ovaries. COMPARISON:  CT with IV contrast today, PET-CT 07/06/2023, and earlier CT studies from this year back to 12/18/2022. FINDINGS: Uterus Measurements: Anteverted and heterogeneous measuring 7.2 x 3.8 x 4.3 cm = volume: 61.2 mL. No fibroids or other mass visualized. Incidentally noted is a 2 cm simple nabothian cysts in the cervix. Despite myometrial heterogeneity there is no appreciable focal wall mass. Endometrium Thickness: 5.7 mm.  No focal abnormality visualized. Right ovary Measurements: 3.0 x 1.4 x 2.0 = volume: 4.3 mL. Normal appearance/no adnexal mass. Left ovary There is a heterogeneous hypervascular left adnexal mass which is inseparable and indistinguishable from the left ovary. On CT this measured 5.4 x 4.8 cm. Ultrasound is 5.5 x 4.8 x 5.2 cm and 72 mL. 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 Pulsed Doppler evaluation of both ovaries demonstrates normal low-resistance arterial and venous waveforms. Other findings Mild anechoic free fluid, which was also seen on CT. IMPRESSION: Heterogeneous hypervascular left adnexal mass which is inseparable from the left ovary. 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 Right ovary, uterus and endometrium are essentially unremarkable with incidental 2 cm simple nabothian cervical cyst. Mildly heterogeneous myometrium. Mild free fluid, nonspecific. Electronically Signed   By: Almira Bar M.D.   On: 07/22/2023 06:01   CT Angio Chest PE W and/or Wo Contrast  Result Date: 07/22/2023 CLINICAL DATA:  Weakness, emesis and diarrhea for the  last several days. Currently taking chemotherapy and radiation for metastatic adrenal carcinoma. EXAM: CT ANGIOGRAPHY CHEST CT ABDOMEN  AND PELVIS WITH CONTRAST TECHNIQUE: Multidetector CT imaging of the chest was performed using the standard protocol during bolus administration of intravenous contrast. Multiplanar CT image reconstructions and MIPs were obtained to evaluate the vascular anatomy. Multidetector CT imaging of the abdomen and pelvis was performed using the standard protocol during bolus administration of intravenous contrast. RADIATION DOSE REDUCTION: This exam was performed according to the departmental dose-optimization program which includes automated exposure control, adjustment of the mA and/or kV according to patient size and/or use of iterative reconstruction technique. CONTRAST:  OMNIPAQUE IOHEXOL 350 MG/ML SOLN COMPARISON:  Portable chest today, PET-CT 07/06/2023, chest, abdomen and pelvis outside CT images only dated 02/03/2023. FINDINGS: CTA CHEST FINDINGS Cardiovascular: Pulmonary arteries are normal in caliber and do not show embolic filling defects. There is minimal atherosclerosis in the aortic arch. There is no aneurysm, stenosis or dissection. The great vessels are clear.  The pulmonary veins are nondistended. There is mild cardiomegaly with a left chamber predominance. There is a right chest port with IJ approach catheter with tip in the right atrium. There is a small pericardial effusion new from 07/06/2023. No visible coronary calcific plaques. Mediastinum/Nodes: Lower paratracheal space node measuring 8 mm in short axis on 4:45 and adjacent right-sided precarinal lymph node also 8 mm short axis. Subcarinal lymph node up to 9 mm short axis. All of these with uptake on PET-CT but no bulky adenopathy. No hilar adenopathy. In the left axilla multiple enlarged lymph nodes again are noted, with multifocal PET activity. Largest of these is 1.4 x 3.3 cm on 4:17, 2 x 1.5 cm on 4:20 and  2 x 1.5 cm on 4:31. There has been no change since 07/06/2023. There is an enlarged right axillary node measuring 1.3 x 1.8 cm on 4:34. Right cardiophrenic angle lymph nodes largest of these is 9 mm in short axis on 4:95. Lungs/Pleura: Mildly elevated right hemidiaphragm. Unchanged 6 mm pleural-based posterior left upper lobe nodule on 5:36. Unchanged 5 mm pleural-based nodule anteriorly in the left upper lobe on 5:49. There are no other appreciable nodules. There is no consolidation, effusion or pneumothorax. 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 There are scattered thoracic spine endplate Schmorl's nodes but no acute compression fracture. Findings are more confluent and widespread than on 02/03/2023. Review of the MIP images confirms the above findings. CT ABDOMEN and PELVIS FINDINGS Hepatobiliary: No hepatic mass enhancement. Mild hepatic steatosis. Cholelithiasis without evidence of cholecystitis or biliary dilatation. Pancreas: No abnormality. Spleen: No abnormality. 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 Both kidneys enhance homogeneously. There is no urinary stone or obstruction. No bladder thickening. Stomach/Bowel: There is fluid in the colon in the ascending and transverse segments. There are increasing thickened folds in the distal descending and proximal/mid sigmoid colon and adjacent stranding consistent with colitis or diverticulitis. There is no wall pneumatosis, free air, or abscess. Vascular/Lymphatic: Small hypermetabolic lymph nodes were noted on the recent PET-CT and have not changed. Examples include an aortocaval lymph node 7 mm short axis on 11: 39, left external iliac chain node is 9 mm short axis on 11:77, with scattered subcentimeter hypermetabolic inguinal lymph nodes also recently seen. By strict size criteria, majority of the hypermetabolic nodes are not frankly enlarged. There are slightly  prominent left periaortic chain nodes up to 1 cm in short axis. Large heterogeneous left adnexal/pelvic mass again measures 5.4 x 4.8 cm on 11:72. This is about double the size it was  on an earlier study this year on 12/18/2022. Reproductive: The uterus is intact. No new adnexal abnormality. 2 cm transmural fibroid of the right side of the fundus. Other: Small volume of pelvic ascites. No free hemorrhage, free air or localizing collections. 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 No visible pathologic regional fracture. Review of the MIP images confirms the above findings. IMPRESSION: 1. No evidence of arterial embolus or acute chest process. 2. Small pericardial effusion, new from 07/06/2023. 3. Cardiomegaly without evidence of CHF. 4. Unchanged 6 mm and 5 mm left upper lobe nodules. 5. Bilateral axillary adenopathy, left greater than right, with borderline to slightly prominent mediastinal, retroperitoneal and pelvic lymph nodes. Positive PET activity on recent PET-CT. 6. 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 7. Increasing thickened folds in the distal descending and proximal/mid sigmoid colon with adjacent stranding consistent with colitis or diverticulitis. No wall pneumatosis, free air, or abscess. 8. 5.4 x 4.8 cm heterogeneous left adnexal/pelvic mass almost certainly metastatic, about double the size it was on an earlier study this year on 12/18/2022. 9. Small volume of pelvic ascites.  No free air. 10. Cholelithiasis without evidence of cholecystitis. 11. Mild hepatic steatosis. Electronically Signed   By: Almira Bar M.D.   On: 07/22/2023 04:40   CT ABDOMEN PELVIS W CONTRAST  Result Date: 07/22/2023 CLINICAL DATA:  Weakness, emesis and diarrhea for the last several days. Currently taking chemotherapy and radiation for metastatic adrenal carcinoma. EXAM: CT ANGIOGRAPHY CHEST CT ABDOMEN AND PELVIS WITH CONTRAST  TECHNIQUE: Multidetector CT imaging of the chest was performed using the standard protocol during bolus administration of intravenous contrast. Multiplanar CT image reconstructions and MIPs were obtained to evaluate the vascular anatomy. Multidetector CT imaging of the abdomen and pelvis was performed using the standard protocol during bolus administration of intravenous contrast. RADIATION DOSE REDUCTION: This exam was performed according to the departmental dose-optimization program which includes automated exposure control, adjustment of the mA and/or kV according to patient size and/or use of iterative reconstruction technique. CONTRAST:  OMNIPAQUE IOHEXOL 350 MG/ML SOLN COMPARISON:  Portable chest today, PET-CT 07/06/2023, chest, abdomen and pelvis outside CT images only dated 02/03/2023. FINDINGS: CTA CHEST FINDINGS Cardiovascular: Pulmonary arteries are normal in caliber and do not show embolic filling defects. There is minimal atherosclerosis in the aortic arch. There is no aneurysm, stenosis or dissection. The great vessels are clear.  The pulmonary veins are nondistended. There is mild cardiomegaly with a left chamber predominance. There is a right chest port with IJ approach catheter with tip in the right atrium. There is a small pericardial effusion new from 07/06/2023. No visible coronary calcific plaques. Mediastinum/Nodes: Lower paratracheal space node measuring 8 mm in short axis on 4:45 and adjacent right-sided precarinal lymph node also 8 mm short axis. Subcarinal lymph node up to 9 mm short axis. All of these with uptake on PET-CT but no bulky adenopathy. No hilar adenopathy. In the left axilla multiple enlarged lymph nodes again are noted, with multifocal PET activity. Largest of these is 1.4 x 3.3 cm on 4:17, 2 x 1.5 cm on 4:20 and 2 x 1.5 cm on 4:31. There has been no change since 07/06/2023. There is an enlarged right axillary node measuring 1.3 x 1.8 cm on 4:34. Right cardiophrenic  angle lymph nodes largest of these is 9 mm in short axis on 4:95. Lungs/Pleura: Mildly elevated right hemidiaphragm. Unchanged 6 mm pleural-based posterior left upper lobe nodule on 5:36. Unchanged 5 mm  pleural-based nodule anteriorly in the left upper lobe on 5:49. There are no other appreciable nodules. There is no consolidation, effusion or pneumothorax. 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 There are scattered thoracic spine endplate Schmorl's nodes but no acute compression fracture. Findings are more confluent and widespread than on 02/03/2023. Review of the MIP images confirms the above findings. CT ABDOMEN and PELVIS FINDINGS Hepatobiliary: No hepatic mass enhancement. Mild hepatic steatosis. Cholelithiasis without evidence of cholecystitis or biliary dilatation. Pancreas: No abnormality. Spleen: No abnormality. 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 Both kidneys enhance homogeneously. There is no urinary stone or obstruction. No bladder thickening. Stomach/Bowel: There is fluid in the colon in the ascending and transverse segments. There are increasing thickened folds in the distal descending and proximal/mid sigmoid colon and adjacent stranding consistent with colitis or diverticulitis. There is no wall pneumatosis, free air, or abscess. Vascular/Lymphatic: Small hypermetabolic lymph nodes were noted on the recent PET-CT and have not changed. Examples include an aortocaval lymph node 7 mm short axis on 11: 39, left external iliac chain node is 9 mm short axis on 11:77, with scattered subcentimeter hypermetabolic inguinal lymph nodes also recently seen. By strict size criteria, majority of the hypermetabolic nodes are not frankly enlarged. There are slightly prominent left periaortic chain nodes up to 1 cm in short axis. Large heterogeneous left adnexal/pelvic mass again measures 5.4 x 4.8 cm on 11:72. This is about  double the size it was on an earlier study this year on 12/18/2022. Reproductive: The uterus is intact. No new adnexal abnormality. 2 cm transmural fibroid of the right side of the fundus. Other: Small volume of pelvic ascites. No free hemorrhage, free air or localizing collections. 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 No visible pathologic regional fracture. Review of the MIP images confirms the above findings. IMPRESSION: 1. No evidence of arterial embolus or acute chest process. 2. Small pericardial effusion, new from 07/06/2023. 3. Cardiomegaly without evidence of CHF. 4. Unchanged 6 mm and 5 mm left upper lobe nodules. 5. Bilateral axillary adenopathy, left greater than right, with borderline to slightly prominent mediastinal, retroperitoneal and pelvic lymph nodes. Positive PET activity on recent PET-CT. 6. 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 7. Increasing thickened folds in the distal descending and proximal/mid sigmoid colon with adjacent stranding consistent with colitis or diverticulitis. No wall pneumatosis, free air, or abscess. 8. 5.4 x 4.8 cm heterogeneous left adnexal/pelvic mass almost certainly metastatic, about double the size it was on an earlier study this year on 12/18/2022. 9. Small volume of pelvic ascites.  No free air. 10. Cholelithiasis without evidence of cholecystitis. 11. Mild hepatic steatosis. Electronically Signed   By: Almira Bar M.D.   On: 07/22/2023 04:40   DG Chest Port 1 View  Result Date: 07/22/2023 CLINICAL DATA:  Generalized weakness for several days, history of adrenal carcinoma EXAM: PORTABLE CHEST 1 VIEW COMPARISON:  PET-CT from 07/06/2019 FINDINGS: Cardiac shadow is within normal limits. Lungs are well aerated bilaterally. Right chest wall port is noted. Bony structures demonstrate increased sclerosis consistent with metastatic disease. IMPRESSION: Changes consistent with  bony metastatic disease. These are stable from prior PET-CT. No acute abnormality noted. Electronically Signed   By: Alcide Clever M.D.   On: 07/22/2023 02:50   NM PET Image Restag (PS) Skull Base To Thigh  Result Date: 07/15/2023 CLINICAL DATA:  Subsequent treatment strategy for adrenal carcinoma. EXAM: NUCLEAR MEDICINE  PET SKULL BASE TO THIGH TECHNIQUE: 9.6 mCi F-18 FDG was injected intravenously. Full-ring PET imaging was performed from the skull base to thigh after the radiotracer. CT data was obtained and used for attenuation correction and anatomic localization. Fasting blood glucose: 93 mg/dl COMPARISON:  30/16/0109. FINDINGS: Mediastinal blood pool activity: SUV max 2.3 Liver activity: SUV max NA NECK: Worsening cervical hypermetabolic adenopathy. Index posterior left level II/III lymph node measures 8 mm (7/25), SUV max 6.1, previously 4 mm and 2.2. Incidental CT findings: None. CHEST: Hypermetabolic mediastinal and axillary lymph nodes, progressive. Index left axillary lymph node measures 13 mm (7/47), SUV max 7.9, compared to 10 mm and 6.8. Incidental CT findings: Right IJ Port-A-Cath terminates in the right atrium. Heart is enlarged. No pericardial or pleural effusion. ABDOMEN/PELVIS: Similar hypermetabolic abdominal retroperitoneal lymph nodes. Index aortocaval lymph node measures 8 mm (7/96), SUV max 4.5. Progressive pelvic retroperitoneal lymph nodes. Index left external iliac lymph node measures 12 mm (7/134), SUV max 8.2, previously 10 mm and 2.4. Additional small hypermetabolic inguinal lymph nodes. Enlarging left adnexal mass now measures 4.5 x 5.5 cm (7/128), SUV max similar at 3.3, previously 4.4 x 4.7 cm. Incidental CT findings: Liver is grossly unremarkable. Gallstones. Adrenal glands, kidneys, spleen, pancreas, stomach and bowel are grossly unremarkable. SKELETON: Mottled sclerosis and hypermetabolism throughout the visualized osseous structures, similar to the prior exam. Index  hypermetabolism in the proximal right femur, SUV max 8.3, compared to 9.3. Incidental CT findings: None. IMPRESSION: 1. Slight interval progression in metastatic disease as evidenced by enlarging and/or increasing hypermetabolic lymph nodes in the neck, chest and pelvis. Abdominal retroperitoneal lymph nodes and osseous metastatic disease appear similar. 2. Enlarging and mildly hypermetabolic left adnexal mass is worrisome for a metastasis. Previous ultrasound 02/24/2023. 3. Cholelithiasis. Electronically Signed   By: Leanna Battles M.D.   On: 07/15/2023 13:06    PERFORMANCE STATUS (ECOG) : 1 - Symptomatic but completely ambulatory  Review of Systems Unless otherwise noted, a complete review of systems is negative.  Physical Exam General: NAD Cardiovascular: regular rate and rhythm Pulmonary: clear ant fields Abdomen: soft, nontender, + bowel sounds GU: no suprapubic tenderness Extremities: no edema, no joint deformities Skin: no rashes Neurological: Weakness but otherwise nonfocal  IMPRESSION: Patient well-known to me from the clinic.  She was admitted with abdominal pain with CT findings suggestive of colitis, presumed to either be from radiation or immunotherapy.  Patient is pending GI luminal evaluation later today.  Patient endorses 8 out of 10 abdominal pain.  She is receiving IV morphine (6 mg in past 24 hours), which she finds helpful but short-lived.  At baseline, patient is managed by a pain clinic and is on tramadol ER 300 mg daily and takes Percocet 10-325mg  6 times daily.  Total daily MME of 100 mg.  She is not currently taking tramadol or oxycodone while hospitalized as she describes both contributing to nausea.  She has only been receiving IV morphine.  Her current opioid utilization is roughly 20% of her home regimen.  This may be contributing to her report poorly controlled pain during the hospitalization.  Discussed options of starting her on transdermal fentanyl (given  reported nausea with extended release tramadol) the patient would like to resume her previous home regimen at time of discharge and adhere to her established pain contract.  Will therefore liberalize dosing of IV morphine.  Patient did have some questions regarding PCA dosing which we discussed in detail.  Hopefully, her pain will improve with  treatment of colitis.  CT did suggest progressive disease.  Patient is pending medical oncology evaluation.  PLAN: -Continue current scope of treatment -Liberalize dosing of IV morphine -Patient will likely benefit from goals of care conversation following GI workup  Case and plan discussed with Drs. Allena Katz and [place]  Time Total: 60 minutes  Visit consisted of counseling and education dealing with the complex and emotionally intense issues of symptom management and palliative care in the setting of serious and potentially life-threatening illness.Greater than 50%  of this time was spent counseling and coordinating care related to the above assessment and plan.  Signed by: Laurette Schimke, PhD, NP-C

## 2023-07-26 NOTE — Consult Note (Signed)
[facility]  [phone] [phone]  ID: Sheryl Silva OB: 07-10-1976  MR#: [id number]  WUX#:[id number]  Patient Care Team: [name], MD as PCP - General (Internal Medicine) Jim Like, RN as Registered Nurse Scarlett Presto, RN (Inactive) as Registered Nurse Benita Gutter, RN as Oncology Nurse Navigator Jeralyn Ruths, MD as Consulting Physician (Oncology)  CHIEF COMPLAINT: Stage IV signet ring cell carcinoma, colitis.  INTERVAL HISTORY: Patient is a 47 year old female actively receiving chemotherapy and Keytruda for the above-stated malignancy.  She initially presented to the emergency room with worsening abdominal pain and significant diarrhea.  Subsequent workup revealed colitis.  Despite antibiotics, patient's symptoms did not resolve.  Her pain is mildly improved, but she states her diarrhea is unchanged.  She has no neurologic complaints.  She denies any recent fevers.  She has a poor appetite, but denies weight loss.  She has no chest pain, shortness of breath, cough, or hemoptysis.  She denies any nausea, vomiting, or constipation.  She has no urinary complaints.  Patient feels generally terrible, but offers no further specific complaints today.  REVIEW OF SYSTEMS:   Review of Systems  Constitutional:  Positive for malaise/fatigue. Negative for fever and weight loss.  Respiratory: Negative.  Negative for cough, hemoptysis and shortness of breath.   Cardiovascular: Negative.  Negative for chest pain and leg swelling.  Gastrointestinal:  Positive for abdominal pain and diarrhea.  Genitourinary: Negative.  Negative for dysuria.  Musculoskeletal: Negative.  Negative for back pain.  Skin: Negative.  Negative for rash.  Neurological:  Positive for weakness. Negative for dizziness, focal weakness and headaches.  Psychiatric/Behavioral: Negative.  The patient is not nervous/anxious.     As per HPI. Otherwise, a complete  review of systems is negative.  PAST MEDICAL HISTORY: Past Medical History:  Diagnosis Date   Anxiety    Cancer (HCC)    Depression    Heart murmur    Hypertension     PAST SURGICAL HISTORY: Past Surgical History:  Procedure Laterality Date   CESAREAN SECTION     COLONOSCOPY WITH PROPOFOL N/A 02/21/2023   Procedure: COLONOSCOPY WITH PROPOFOL;  Surgeon: Regis Bill, MD;  Location: [facility] ENDOSCOPY;  Service: Endoscopy;  Laterality: N/A;   DILATION AND CURETTAGE OF UTERUS     IR IMAGING GUIDED PORT INSERTION  02/16/2023    FAMILY HISTORY: Family History  Problem Relation Age of Onset   Cancer Maternal Grandmother     ADVANCED DIRECTIVES (Y/N):  @ADVDIR @  HEALTH MAINTENANCE: Social History   Tobacco Use   Smoking status: Former    Current packs/day: 0.00    Types: Cigarettes    Quit date: 12/19/2022    Years since quitting: 0.6   Smokeless tobacco: Never   Tobacco comments:    Quit smoking 12/2022  Vaping Use   Vaping status: Never Used  Substance Use Topics   Alcohol use: Yes    Comment: occ   Drug use: No     Colonoscopy:  PAP:  Bone density:  Lipid panel:  Allergies  Allergen Reactions   Nifedipine Palpitations    Current Facility-Administered Medications  Medication Dose Route Frequency Provider Last Rate Last Admin   0.9 %  sodium chloride infusion   Intravenous PRN Enedina Finner, MD   Stopped at 07/25/23 1757   0.9 %  sodium chloride infusion   Intravenous Continuous Vanga, Loel Dubonnet, MD 20 mL/hr at 07/26/23 0900 Started During Downtime at 07/26/23 0900  0.9 %  sodium chloride infusion   Intravenous Continuous Vanga, Loel Dubonnet, MD       acetaminophen (TYLENOL) tablet 650 mg  650 mg Oral Q6H PRN Mikey College T, MD   650 mg at 07/26/23 1035   acidophilus (RISAQUAD) capsule 2 capsule  2 capsule Oral Daily Enedina Finner, MD   2 capsule at 07/26/23 0854   albuterol (PROVENTIL) (2.5 MG/3ML) 0.083% nebulizer solution 2.5 mg  2.5 mg Nebulization Q4H  PRN Manuela Schwartz, NP   2.5 mg at 07/24/23 2001   busPIRone (BUSPAR) tablet 5 mg  5 mg Oral BID Enedina Finner, MD   5 mg at 07/26/23 5784   cyanocobalamin (VITAMIN B12) tablet 500 mcg  500 mcg Oral Daily Enedina Finner, MD   500 mcg at 07/26/23 0855   DULoxetine (CYMBALTA) DR capsule 60 mg  60 mg Oral Daily Enedina Finner, MD   60 mg at 07/26/23 0856   enoxaparin (LOVENOX) injection 40 mg  40 mg Subcutaneous Q24H Mikey College T, MD   40 mg at 07/25/23 2039   gabapentin (NEURONTIN) capsule 300 mg  300 mg Oral TID Mikey College T, MD   300 mg at 07/26/23 0855   hydrocortisone (ANUSOL-HC) 2.5 % rectal cream   Topical BID Angelique Blonder, [facility]   Given at 07/26/23 0856   lactobacillus (FLORANEX/LACTINEX) granules 1 g  1 g Oral TID WC Mikey College T, MD   1 g at 07/26/23 0854   lactose free nutrition (BOOST PLUS) liquid 237 mL  237 mL Oral TID WC Enedina Finner, MD   237 mL at 07/25/23 1725   megestrol (MEGACE) tablet 40 mg  40 mg Oral Daily Mikey College T, MD   40 mg at 07/26/23 0853   morphine (PF) 4 MG/ML injection 4 mg  4 mg Intravenous Q4H PRN Borders, Daryl Eastern, NP   4 mg at 07/26/23 1304   multivitamin with minerals tablet 1 tablet  1 tablet Oral Daily Enedina Finner, MD   1 tablet at 07/26/23 0855   ondansetron (ZOFRAN) injection 4 mg  4 mg Intravenous Q6H PRN Mikey College T, MD   4 mg at 07/26/23 1028   oxyCODONE-acetaminophen (PERCOCET/ROXICET) 5-325 MG per tablet 1-2 tablet  1-2 tablet Oral Q4H PRN Enedina Finner, MD       And   oxyCODONE (Oxy IR/ROXICODONE) immediate release tablet 5 mg  5 mg Oral Q4H PRN Enedina Finner, MD       pantoprazole (PROTONIX) EC tablet 40 mg  40 mg Oral Daily Mikey College T, MD   40 mg at 07/26/23 0855   potassium chloride SA (KLOR-CON M) CR tablet 40 mEq  40 mEq Oral Once Mikey College T, MD       traMADol Janean Sark) tablet 50 mg  50 mg Oral Q12H PRN Enedina Finner, MD   50 mg at 07/25/23 0250   Vitamin D (Ergocalciferol) (DRISDOL) 1.25 MG (50000 UNIT) capsule 50,000 Units  50,000 Units Oral  Q7 days Enedina Finner, MD   50,000 Units at 07/24/23 2034   Facility-Administered Medications Ordered in Other Encounters  Medication Dose Route Frequency Provider Last Rate Last Admin   sodium chloride flush (NS) 0.9 % injection 10 mL  10 mL Intracatheter PRN Jeralyn Ruths, MD   10 mL at 06/29/23 1430    OBJECTIVE: Vitals:   07/26/23 0339 07/26/23 0725  BP: 116/62 (!) 138/90  Pulse: (!) 110 (!) 108  Resp: 16 20  Temp: 98.7 F (37.1  C) 98.3 F (36.8 C)  SpO2: 100% 100%     There is no height or weight on file to calculate BMI.    ECOG FS:3 - Symptomatic, >50% confined to bed  General: Ill-appearing, no acute distress. Eyes: Pink conjunctiva, anicteric sclera. HEENT: Normocephalic, moist mucous membranes. Lungs: No audible wheezing or coughing. Heart: Regular rate and rhythm. Abdomen: Soft, nontender, no obvious distention. Musculoskeletal: No edema, cyanosis, or clubbing. Neuro: Alert, answering all questions appropriately. Cranial nerves grossly intact. Skin: No rashes or petechiae noted. Psych: Normal affect.  LAB RESULTS:  Lab Results  Component Value Date   NA 130 (L) 07/23/2023   K 3.5 07/23/2023   CL 103 07/23/2023   CO2 18 (L) 07/23/2023   GLUCOSE 85 07/23/2023   BUN 12 07/23/2023   CREATININE 0.57 07/23/2023   CALCIUM 7.7 (L) 07/23/2023   PROT 5.9 (L) 07/23/2023   ALBUMIN 2.9 (L) 07/23/2023   AST 27 07/23/2023   ALT 33 07/23/2023   ALKPHOS 247 (H) 07/23/2023   BILITOT 0.9 07/23/2023   GFRNONAA >60 07/23/2023   GFRAA >60 09/27/2018    Lab Results  Component Value Date   WBC 5.3 07/23/2023   NEUTROABS 3.9 07/22/2023   HGB 8.9 (L) 07/23/2023   HCT 25.7 (L) 07/23/2023   MCV 92.8 07/23/2023   PLT 51 (L) 07/23/2023     STUDIES: US PELVIC COMPLETE W TRANSVAGINAL AND TORSION R/O  Result Date: 07/22/2023 CLINICAL DATA:  [id number] with pelvic pain for 5 days. History of metastatic adrenal carcinoma and steadily enlarging left adnexal/pelvic mass on CT  with a normal left ovary not able to be distinguished from it. There was also evidence of distal descending and proximal to mid sigmoid colitis/diverticulitis on CT today. EXAM: TRANSABDOMINAL AND TRANSVAGINAL ULTRASOUND OF PELVIS DOPPLER ULTRASOUND OF OVARIES TECHNIQUE: Both transabdominal and transvaginal ultrasound examinations of the pelvis were performed. Transabdominal technique was performed for global imaging of the pelvis including uterus, ovaries, adnexal regions, and pelvic cul-de-sac. It was necessary to proceed with endovaginal exam following the transabdominal exam to visualize the uterus, right ovary and endometrium better. Color and duplex Doppler ultrasound was utilized to evaluate blood flow to the ovaries. COMPARISON:  CT with IV contrast today, PET-CT 07/06/2023, and earlier CT studies from this year back to 12/18/2022. FINDINGS: Uterus Measurements: Anteverted and heterogeneous measuring 7.2 x 3.8 x 4.3 cm = volume: 61.2 mL. No fibroids or other mass visualized. Incidentally noted is a 2 cm simple nabothian cysts in the cervix. Despite myometrial heterogeneity there is no appreciable focal wall mass. Endometrium Thickness: 5.7 mm.  No focal abnormality visualized. Right ovary Measurements: 3.0 x 1.4 x 2.0 = volume: 4.3 mL. Normal appearance/no adnexal mass. Left ovary There is a heterogeneous hypervascular left adnexal mass which is inseparable and indistinguishable from the left ovary. On CT this measured 5.4 x 4.8 cm. Ultrasound is 5.5 x 4.8 x 5.2 cm and 72 mL. 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 Pulsed Doppler evaluation of both ovaries demonstrates normal low-resistance arterial and venous waveforms. Other findings Mild anechoic free fluid, which was also seen on CT. IMPRESSION: Heterogeneous  hypervascular left adnexal mass which is inseparable from the left ovary. Given steady enlargement over serial studies from this year it is probably either a primary or more likely metastatic neoplasm in this patient  with known stage IV disease with extensive bone metastases and multifocal adenopathy. Right ovary, uterus and endometrium are essentially unremarkable with incidental 2 cm simple nabothian cervical cyst. Mildly heterogeneous myometrium. Mild free fluid, nonspecific. Electronically Signed   By: Almira Bar M.D.   On: 07/22/2023 06:01   CT Angio Chest PE W and/or Wo Contrast  Result Date: 07/22/2023 CLINICAL DATA:  Weakness, emesis and diarrhea for the last several days. Currently taking chemotherapy and radiation for metastatic adrenal carcinoma. EXAM: CT ANGIOGRAPHY CHEST CT ABDOMEN AND PELVIS WITH CONTRAST TECHNIQUE: Multidetector CT imaging of the chest was performed using the standard protocol during bolus administration of intravenous contrast. Multiplanar CT image reconstructions and MIPs were obtained to evaluate the vascular anatomy. Multidetector CT imaging of the abdomen and pelvis was performed using the standard protocol during bolus administration of intravenous contrast. RADIATION DOSE REDUCTION: This exam was performed according to the departmental dose-optimization program which includes automated exposure control, adjustment of the mA and/or kV according to patient size and/or use of iterative reconstruction technique. CONTRAST:  OMNIPAQUE IOHEXOL 350 MG/ML SOLN COMPARISON:  Portable chest today, PET-CT 07/06/2023, chest, abdomen and pelvis outside CT images only dated 02/03/2023. FINDINGS: CTA CHEST FINDINGS Cardiovascular: Pulmonary arteries are normal in caliber and do not show embolic filling defects. There is minimal atherosclerosis in the aortic arch. There is no aneurysm, stenosis or dissection. The great vessels are clear.  The pulmonary veins are nondistended. There  is mild cardiomegaly with a left chamber predominance. There is a right chest port with IJ approach catheter with tip in the right atrium. There is a small pericardial effusion new from 07/06/2023. No visible coronary calcific plaques. Mediastinum/Nodes: Lower paratracheal space node measuring 8 mm in short axis on 4:45 and adjacent right-sided precarinal lymph node also 8 mm short axis. Subcarinal lymph node up to 9 mm short axis. All of these with uptake on PET-CT but no bulky adenopathy. No hilar adenopathy. In the left axilla multiple enlarged lymph nodes again are noted, with multifocal PET activity. Largest of these is 1.4 x 3.3 cm on 4:17, 2 x 1.5 cm on 4:20 and 2 x 1.5 cm on 4:31. There has been no change since 07/06/2023. There is an enlarged right axillary node measuring 1.3 x 1.8 cm on 4:34. Right cardiophrenic angle lymph nodes largest of these is 9 mm in short axis on 4:95. Lungs/Pleura: Mildly elevated right hemidiaphragm. Unchanged 6 mm pleural-based posterior left upper lobe nodule on 5:36. Unchanged 5 mm pleural-based nodule anteriorly in the left upper lobe on 5:49. There are no other appreciable nodules. There is no consolidation, effusion or pneumothorax. 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 There are scattered thoracic spine endplate Schmorl's nodes but no acute compression fracture. Findings are more confluent and widespread than on 02/03/2023. Review of the MIP images confirms the above findings. CT ABDOMEN and PELVIS FINDINGS Hepatobiliary: No hepatic mass enhancement. Mild hepatic steatosis. Cholelithiasis without evidence of cholecystitis or biliary dilatation. Pancreas: No abnormality. Spleen: No abnormality. 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 Both kidneys enhance homogeneously. There is no urinary stone or obstruction. No bladder thickening. Stomach/Bowel: There is fluid in the colon in the  ascending and transverse segments. There are increasing thickened folds in the distal descending and proximal/mid sigmoid colon and adjacent stranding consistent with colitis or diverticulitis. There is no wall pneumatosis, free air, or abscess. Vascular/Lymphatic: Small hypermetabolic lymph nodes were noted on the  recent PET-CT and have not changed. Examples include an aortocaval lymph node 7 mm short axis on 11: 39, left external iliac chain node is 9 mm short axis on 11:77, with scattered subcentimeter hypermetabolic inguinal lymph nodes also recently seen. By strict size criteria, majority of the hypermetabolic nodes are not frankly enlarged. There are slightly prominent left periaortic chain nodes up to 1 cm in short axis. Large heterogeneous left adnexal/pelvic mass again measures 5.4 x 4.8 cm on 11:72. This is about double the size it was on an earlier study this year on 12/18/2022. Reproductive: The uterus is intact. No new adnexal abnormality. 2 cm transmural fibroid of the right side of the fundus. Other: Small volume of pelvic ascites. No free hemorrhage, free air or localizing collections. 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 No visible pathologic regional fracture. Review of the MIP images confirms the above findings. IMPRESSION: 1. No evidence of arterial embolus or acute chest process. 2. Small pericardial effusion, new from 07/06/2023. 3. Cardiomegaly without evidence of CHF. 4. Unchanged 6 mm and 5 mm left upper lobe nodules. 5. Bilateral axillary adenopathy, left greater than right, with borderline to slightly prominent mediastinal, retroperitoneal and pelvic lymph nodes. Positive PET activity on recent PET-CT. 6. 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 7. Increasing thickened folds in the distal descending and proximal/mid sigmoid colon with adjacent stranding consistent with colitis or diverticulitis. No wall  pneumatosis, free air, or abscess. 8. 5.4 x 4.8 cm heterogeneous left adnexal/pelvic mass almost certainly metastatic, about double the size it was on an earlier study this year on 12/18/2022. 9. Small volume of pelvic ascites.  No free air. 10. Cholelithiasis without evidence of cholecystitis. 11. Mild hepatic steatosis. Electronically Signed   By: Almira Bar M.D.   On: 07/22/2023 04:40   CT ABDOMEN PELVIS W CONTRAST  Result Date: 07/22/2023 CLINICAL DATA:  Weakness, emesis and diarrhea for the last several days. Currently taking chemotherapy and radiation for metastatic adrenal carcinoma. EXAM: CT ANGIOGRAPHY CHEST CT ABDOMEN AND PELVIS WITH CONTRAST TECHNIQUE: Multidetector CT imaging of the chest was performed using the standard protocol during bolus administration of intravenous contrast. Multiplanar CT image reconstructions and MIPs were obtained to evaluate the vascular anatomy. Multidetector CT imaging of the abdomen and pelvis was performed using the standard protocol during bolus administration of intravenous contrast. RADIATION DOSE REDUCTION: This exam was performed according to the departmental dose-optimization program which includes automated exposure control, adjustment of the mA and/or kV according to patient size and/or use of iterative reconstruction technique. CONTRAST:  OMNIPAQUE IOHEXOL 350 MG/ML SOLN COMPARISON:  Portable chest today, PET-CT 07/06/2023, chest, abdomen and pelvis outside CT images only dated 02/03/2023. FINDINGS: CTA CHEST FINDINGS Cardiovascular: Pulmonary arteries are normal in caliber and do not show embolic filling defects. There is minimal atherosclerosis in the aortic arch. There is no aneurysm, stenosis or dissection. The great vessels are clear.  The pulmonary veins are nondistended. There is mild cardiomegaly with a left chamber predominance. There is a right chest port with IJ approach catheter with tip in the right atrium. There is a small pericardial  effusion new from 07/06/2023. No visible coronary calcific plaques. Mediastinum/Nodes: Lower paratracheal space node measuring 8 mm in short axis on 4:45 and adjacent right-sided precarinal lymph node also 8 mm short axis. Subcarinal lymph node up to 9 mm short axis. All of these with uptake on PET-CT but no bulky adenopathy. No hilar adenopathy. In the left axilla  multiple enlarged lymph nodes again are noted, with multifocal PET activity. Largest of these is 1.4 x 3.3 cm on 4:17, 2 x 1.5 cm on 4:20 and 2 x 1.5 cm on 4:31. There has been no change since 07/06/2023. There is an enlarged right axillary node measuring 1.3 x 1.8 cm on 4:34. Right cardiophrenic angle lymph nodes largest of these is 9 mm in short axis on 4:95. Lungs/Pleura: Mildly elevated right hemidiaphragm. Unchanged 6 mm pleural-based posterior left upper lobe nodule on 5:36. Unchanged 5 mm pleural-based nodule anteriorly in the left upper lobe on 5:49. There are no other appreciable nodules. There is no consolidation, effusion or pneumothorax. 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 There are scattered thoracic spine endplate Schmorl's nodes but no acute compression fracture. Findings are more confluent and widespread than on 02/03/2023. Review of the MIP images confirms the above findings. CT ABDOMEN and PELVIS FINDINGS Hepatobiliary: No hepatic mass enhancement. Mild hepatic steatosis. Cholelithiasis without evidence of cholecystitis or biliary dilatation. Pancreas: No abnormality. Spleen: No abnormality. 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 Both kidneys enhance homogeneously. There is no urinary stone or obstruction. No bladder thickening. Stomach/Bowel: There is fluid in the colon in the ascending and transverse segments. There are increasing thickened folds in the distal descending and proximal/mid sigmoid colon and adjacent stranding consistent with  colitis or diverticulitis. There is no wall pneumatosis, free air, or abscess. Vascular/Lymphatic: Small hypermetabolic lymph nodes were noted on the recent PET-CT and have not changed. Examples include an aortocaval lymph node 7 mm short axis on 11: 39, left external iliac chain node is 9 mm short axis on 11:77, with scattered subcentimeter hypermetabolic inguinal lymph nodes also recently seen. By strict size criteria, majority of the hypermetabolic nodes are not frankly enlarged. There are slightly prominent left periaortic chain nodes up to 1 cm in short axis. Large heterogeneous left adnexal/pelvic mass again measures 5.4 x 4.8 cm on 11:72. This is about double the size it was on an earlier study this year on 12/18/2022. Reproductive: The uterus is intact. No new adnexal abnormality. 2 cm transmural fibroid of the right side of the fundus. Other: Small volume of pelvic ascites. No free hemorrhage, free air or localizing collections. 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 No visible pathologic regional fracture. Review of the MIP images confirms the above findings. IMPRESSION: 1. No evidence of arterial embolus or acute chest process. 2. Small pericardial effusion, new from 07/06/2023. 3. Cardiomegaly without evidence of CHF. 4. Unchanged 6 mm and 5 mm left upper lobe nodules. 5. Bilateral axillary adenopathy, left greater than right, with borderline to slightly prominent mediastinal, retroperitoneal and pelvic lymph nodes. Positive PET activity on recent PET-CT. 6. 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 7. Increasing thickened folds in the distal descending and proximal/mid sigmoid colon with adjacent stranding consistent with colitis or diverticulitis. No wall pneumatosis, free air, or abscess. 8. 5.4 x 4.8 cm heterogeneous left adnexal/pelvic mass almost certainly metastatic, about double the size it was on an earlier study  this year on 12/18/2022. 9. Small volume of pelvic ascites.  No free air. 10. Cholelithiasis without evidence of cholecystitis. 11. Mild hepatic steatosis. Electronically Signed   By: Almira Bar M.D.   On: 07/22/2023 04:40   DG Chest Port 1 View  Result Date: 07/22/2023 CLINICAL DATA:  Generalized weakness for several days, history of adrenal carcinoma EXAM: PORTABLE CHEST 1 VIEW  COMPARISON:  PET-CT from 07/06/2019 FINDINGS: Cardiac shadow is within normal limits. Lungs are well aerated bilaterally. Right chest wall port is noted. Bony structures demonstrate increased sclerosis consistent with metastatic disease. IMPRESSION: Changes consistent with bony metastatic disease. These are stable from prior PET-CT. No acute abnormality noted. Electronically Signed   By: Alcide Clever M.D.   On: 07/22/2023 02:50   NM PET Image Restag (PS) Skull Base To Thigh  Result Date: 07/15/2023 CLINICAL DATA:  Subsequent treatment strategy for adrenal carcinoma. EXAM: NUCLEAR MEDICINE PET SKULL BASE TO THIGH TECHNIQUE: 9.6 mCi F-18 FDG was injected intravenously. Full-ring PET imaging was performed from the skull base to thigh after the radiotracer. CT data was obtained and used for attenuation correction and anatomic localization. Fasting blood glucose: 93 mg/dl COMPARISON:  78/29/5621. FINDINGS: Mediastinal blood pool activity: SUV max 2.3 Liver activity: SUV max NA NECK: Worsening cervical hypermetabolic adenopathy. Index posterior left level II/III lymph node measures 8 mm (7/25), SUV max 6.1, previously 4 mm and 2.2. Incidental CT findings: None. CHEST: Hypermetabolic mediastinal and axillary lymph nodes, progressive. Index left axillary lymph node measures 13 mm (7/47), SUV max 7.9, compared to 10 mm and 6.8. Incidental CT findings: Right IJ Port-A-Cath terminates in the right atrium. Heart is enlarged. No pericardial or pleural effusion. ABDOMEN/PELVIS: Similar hypermetabolic abdominal retroperitoneal lymph nodes.  Index aortocaval lymph node measures 8 mm (7/96), SUV max 4.5. Progressive pelvic retroperitoneal lymph nodes. Index left external iliac lymph node measures 12 mm (7/134), SUV max 8.2, previously 10 mm and 2.4. Additional small hypermetabolic inguinal lymph nodes. Enlarging left adnexal mass now measures 4.5 x 5.5 cm (7/128), SUV max similar at 3.3, previously 4.4 x 4.7 cm. Incidental CT findings: Liver is grossly unremarkable. Gallstones. Adrenal glands, kidneys, spleen, pancreas, stomach and bowel are grossly unremarkable. SKELETON: Mottled sclerosis and hypermetabolism throughout the visualized osseous structures, similar to the prior exam. Index hypermetabolism in the proximal right femur, SUV max 8.3, compared to 9.3. Incidental CT findings: None. IMPRESSION: 1. Slight interval progression in metastatic disease as evidenced by enlarging and/or increasing hypermetabolic lymph nodes in the neck, chest and pelvis. Abdominal retroperitoneal lymph nodes and osseous metastatic disease appear similar. 2. Enlarging and mildly hypermetabolic left adnexal mass is worrisome for a metastasis. Previous ultrasound 02/24/2023. 3. Cholelithiasis. Electronically Signed   By: Leanna Battles M.D.   On: 07/15/2023 13:06    ASSESSMENT: Stage IV signet ring cell carcinoma, colitis.  PLAN:    Stage IV signet ring cell carcinoma: Patient last received chemotherapy and immunotherapy with FOLFOX plus Keytruda on July 18, 2023.  Her next treatment is not scheduled until August 08, 2023.  She has been instructed keep this follow-up as scheduled. Colitis: Unclear etiology.  Antibiotics have been discontinued.  Possibly secondary to [facility] or radiation proctitis.  Appreciate GI input.  Plan is to do sigmoidoscopy today.  If related to [facility], patient may benefit from systemic steroids.  If colitis is only localized and radiation is the suspected etiology, she may benefit from local treatment with steroid  enema. Hyponatremia: Improving.  Patient sodium is 130 today. Anemia: Chronic and unchanged.  Patient's hemoglobin is slightly below her baseline at 8.9 today.  Transfuse if falls below 7.0. Thrombocytopenia: Platelet count trending down and is now 51.  Secondary to chemotherapy.  Monitor.  Appreciate consult, will follow.   Jeralyn Ruths, MD   07/26/2023 1:22 PM

## 2023-07-26 NOTE — Plan of Care (Signed)

## 2023-07-26 NOTE — Anesthesia Postprocedure Evaluation (Signed)
Anesthesia Post Note  Patient: Sheryl Silva  Procedure(s) Performed: FLEXIBLE SIGMOIDOSCOPY BIOPSY  Patient location during evaluation: Endoscopy Anesthesia Type: General Level of consciousness: awake and alert Pain management: pain level controlled Vital Signs Assessment: post-procedure vital signs reviewed and stable Respiratory status: spontaneous breathing, nonlabored ventilation and respiratory function stable Cardiovascular status: blood pressure returned to baseline and stable Postop Assessment: no apparent nausea or vomiting Anesthetic complications: no   No notable events documented.   Last Vitals:  Vitals:   07/26/23 1738 07/26/23 1748  BP: 118/84 (!) 137/96  Pulse: 96 (!) 101  Resp: 19 (!) 23  Temp:    SpO2:  100%    Last Pain:  Vitals:   07/26/23 1738  TempSrc:   PainSc: 10-Worst pain ever                 Foye Deer

## 2023-07-26 NOTE — Progress Notes (Signed)
Pt back from procedure. Pt ao x4, respirations even and unlabored on RA. Marland Kitchen Pt vitals stable at this time. Pt tolerating PO intake. Pt having pain at this time, see [facility]

## 2023-07-26 NOTE — Anesthesia Preprocedure Evaluation (Addendum)
Anesthesia Evaluation  Patient identified by MRN, date of birth, ID band Patient awake    Reviewed: Allergy & Precautions, H&P , NPO status , Patient's Chart, lab work & pertinent test results  Airway Mallampati: II  TM Distance: >3 FB Neck ROM: full    Dental no notable dental hx.    Pulmonary former smoker   Pulmonary exam normal        Cardiovascular hypertension, Normal cardiovascular exam  Buerger's disease  Cardiomegaly and small pericardial effusion   Neuro/Psych  PSYCHIATRIC DISORDERS Anxiety Depression    negative neurological ROS     GI/Hepatic Neg liver ROS,,,Left-sided colitis   Endo/Other  negative endocrine ROS    Renal/GU negative Renal ROS  negative genitourinary   Musculoskeletal   Abdominal  (+) + obese  Peds  Hematology  (+) Blood dyscrasia, anemia thrombocytopenia   Anesthesia Other Findings Stage IV signet ring cell carcinoma, colitis -actively receiving chemotherapy and Keytruda  Hyponatremia  Past Medical History: No date: Anxiety No date: Cancer (HCC) No date: Depression No date: Heart murmur No date: Hypertension  Past Surgical History: No date: CESAREAN SECTION 02/21/2023: COLONOSCOPY WITH PROPOFOL; N/A     Comment:  Procedure: COLONOSCOPY WITH PROPOFOL;  Surgeon:               Regis Bill, MD;  Location: [facility] ENDOSCOPY;                Service: Endoscopy;  Laterality: N/A; No date: DILATION AND CURETTAGE OF UTERUS 02/16/2023: IR IMAGING GUIDED PORT INSERTION     Reproductive/Obstetrics negative OB ROS                             Anesthesia Physical Anesthesia Plan  ASA: 3  Anesthesia Plan: General   Post-op Pain Management:    Induction:   PONV Risk Score and Plan: Propofol infusion and TIVA  Airway Management Planned:   Additional Equipment:   Intra-op Plan:   Post-operative Plan:   Informed Consent: I have reviewed the  patients History and Physical, chart, labs and discussed the procedure including the risks, benefits and alternatives for the proposed anesthesia with the patient or authorized representative who has indicated his/her understanding and acceptance.     Dental Advisory Given  Plan Discussed with: CRNA and Surgeon  Anesthesia Plan Comments:         Anesthesia Quick Evaluation

## 2023-07-26 NOTE — Transfer of Care (Signed)
Immediate Anesthesia Transfer of Care Note  Patient: Sheryl Silva  Procedure(s) Performed: FLEXIBLE SIGMOIDOSCOPY BIOPSY  Patient Location: PACU  Anesthesia Type:MAC  Level of Consciousness: drowsy  Airway & Oxygen Therapy: Patient Spontanous Breathing and Patient connected to face mask oxygen  Post-op Assessment: Report given to RN and Post -op Vital signs reviewed and stable  Post vital signs: Reviewed  Last Vitals:  Vitals Value Taken Time  BP 123/90 07/26/23 1728  Temp 39F   Pulse 110 07/26/23 1728  Resp 18 07/26/23 1732  SpO2 100 % 07/26/23 1728  Vitals shown include unfiled device data.  Last Pain:  Vitals:   07/26/23 1728  TempSrc:   PainSc: Asleep      Patients Stated Pain Goal: 5 (07/23/23 1918)  Complications: No notable events documented.

## 2023-07-26 NOTE — Op Note (Signed)
[facility]astroenterology Patient Name: Sheryl Silva Procedure Date: 07/26/2023 5:06 PM MRN: [id number] Account #: [id number] Date of Birth: 1976-02-20 Admit Type: Inpatient Age: 47 Room: [facility] ENDO ROOM 1 Gender: Female Note Status: Finalized Instrument Name: Upper Endoscope [id number] Procedure:             Flexible Sigmoidoscopy Indications:           Clinically significant diarrhea of unexplained origin,                         Diarrhea (secondary to noninfectious colitis) Providers:             Toney Reil MD, MD Medicines:             General Anesthesia Complications:         No immediate complications. Estimated blood loss: None. Procedure:             Pre-Anesthesia Assessment:                        - Prior to the procedure, a History and Physical was                         performed, and patient medications and allergies were                         reviewed. The patient is competent. The risks and                         benefits of the procedure and the sedation options and                         risks were discussed with the patient. All questions                         were answered and informed consent was obtained.                         Patient identification and proposed procedure were                         verified by the physician, the nurse, the                         anesthesiologist, the anesthetist and the technician                         in the pre-procedure area in the procedure room in the                         endoscopy suite. Mental Status Examination: alert and                         oriented. Airway Examination: normal oropharyngeal                         airway and neck mobility. Respiratory Examination:                         clear  to auscultation. CV Examination: RRR, no                         murmurs, no S3 or S4. Prophylactic Antibiotics: The                         patient does not require prophylactic  antibiotics.                         Prior Anticoagulants: The patient has taken no                         anticoagulant or antiplatelet agents. ASA Grade                         Assessment: III - A patient with severe systemic                         disease. After reviewing the risks and benefits, the                         patient was deemed in satisfactory condition to                         undergo the procedure. The anesthesia plan was to use                         general anesthesia. Immediately prior to                         administration of medications, the patient was                         re-assessed for adequacy to receive sedatives. The                         heart rate, respiratory rate, oxygen saturations,                         blood pressure, adequacy of pulmonary ventilation, and                         response to care were monitored throughout the                         procedure. The physical status of the patient was                         re-assessed after the procedure.                        After obtaining informed consent, the scope was passed                         under direct vision. The Endoscope was introduced                         through the anus and advanced to the the left  transverse colon. The flexible sigmoidoscopy was                         accomplished without difficulty. The patient tolerated                         the procedure well. The quality of the bowel                         preparation was adequate. Findings:      The perianal and digital rectal examinations were normal. Pertinent       negatives include normal sphincter tone and no palpable rectal lesions.      Normal mucosa was found in the descending colon and in the transverse       colon. Biopsies were taken with a cold forceps for histology.      Diffuse mild inflammation characterized by altered vascularity,       congestion (edema),  erythema and friability was found in the       recto-sigmoid colon, in the sigmoid colon and at 30 cm proximal to the       anus. Biopsies were taken with a cold forceps for histology.      The rectum appeared normal. Biopsies were taken with a cold forceps for       histology.      Non-bleeding external hemorrhoids were found during retroflexion. The       hemorrhoids were medium-sized. Impression:            - Normal mucosa in the descending colon and in the                         transverse colon. Biopsied.                        - Diffuse mild inflammation was found in the                         recto-sigmoid colon, in the sigmoid colon and at 30 cm                         proximal to the anus secondary to left-sided colitis.                         Biopsied.                        - The rectum is normal. Biopsied.                        - Non-bleeding external hemorrhoids. Recommendation:        - Return patient to hospital ward for ongoing care.                        - Resume regular diet today.                        - Await pathology results.                        - Use prednisone 40 mg PO once a day. Procedure  Code(s):     --- Professional ---                        [phone], Sigmoidoscopy, flexible; with biopsy, single or                         multiple Diagnosis Code(s):     --- Professional ---                        K64.4, Residual hemorrhoidal skin tags                        K51.50, Left sided colitis without complications                        R19.7, Diarrhea, unspecified                        K52.9, Noninfective gastroenteritis and colitis,                         unspecified CPT copyright 2022 American Medical Association. All rights reserved. The codes documented in this report are preliminary and upon coder review may  be revised to meet current compliance requirements. Dr. Libby Maw Toney Reil MD, MD 07/26/2023 5:27:47 PM This report has been  signed electronically. Number of Addenda: 0 Note Initiated On: 07/26/2023 5:06 PM Total Procedure Duration: 0 hours 12 minutes 1 second  Estimated Blood Loss:  Estimated blood loss: none.      [facility]

## 2023-07-26 NOTE — Progress Notes (Signed)
[facility]  - [place] at [facility]   PATIENT NAME: Sheryl Silva    MR#:  [id number]  DATE OF BIRTH:  Jul 11, 1976  SUBJECTIVE:   Kevin Fenton at bedside. Patient came in with significant abdominal pain. She has chronic pain from her metastatic cancer.  Continues with pain.  Flex sig this afternoon VITALS:  Blood pressure (!) 138/90, pulse (!) 108, temperature 98.3 F (36.8 C), temperature source Oral, resp. rate 20, SpO2 100%.  PHYSICAL EXAMINATION:   GENERAL:  47 y.o.-year-old patient with no acute distress.  LUNGS: Normal breath sounds bilaterally CARDIOVASCULAR: S1, S2 normal. No murmur   ABDOMEN: Soft, diffusely tender, nondistended. EXTREMITIES: No  edema b/l.    NEUROLOGIC: nonfocal  patient is alert and awake  LABORATORY PANEL:  CBC Recent Labs  Lab 07/23/23 0326  WBC 5.3  HGB 8.9*  HCT 25.7*  PLT 51*    Chemistries  Recent Labs  Lab 07/20/23 1515 07/22/23 0225 07/23/23 0326  NA 127*   < > 130*  K 3.4*   < > 3.5  CL 101   < > 103  CO2 20*   < > 18*  GLUCOSE 142*   < > 85  BUN 24*   < > 12  CREATININE 0.53   < > 0.57  CALCIUM 8.6*   < > 7.7*  MG 2.0  --   --   AST 81*   < > 27  ALT 105*   < > 33  ALKPHOS 367*   < > 247*  BILITOT 0.7   < > 0.9   < > = values in this interval not displayed.    RADIOLOGY:  No results found.  Assessment and Plan  Sheryl Silva is a 47 y.o. 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  Patient was diagnosed with stage IV metastatic cancer initially was with on no arranging, pathology from axillary lymph node showed possible GI source.  Patient was started on chemo and radiation therapy with [id number] q. 14 days and so far she completed 7 cycles.  She said that with each cycle of chemotherapy she has had mild diarrhea but was never  severe and or self-limiting.  Last chemotherapy and radiation was 9/16-17 Monday, patient started develop severe cramping pain in the left lower quadrant for 2 days then started to have a severe diarrhea,   CTA negative for PE, extensive evidence of metastatic cancer involving the bones, lymph nodes.  Acute colitis versus diverticulitis descending and sigmoid colon.  Left adnexal pelvic mass.  Pelvic ultrasound showed heterogeneous hypervascular left adnexal mass probably metastatic in nature.   Sepsis due to acute colitis -- patient came in with abdominal pain was found to be tachycardic tachypnea with abnormal CT scan -- IV fluids--d/c -- IV and PO PRN pain meds -- IV Unasyn -- white count normal, lactic acid normal -- PRN Imodium for diarrhea --BC negative --stool studies negative --GI consult with dr Mohammed Kindle flex sig today. Could be due to [facility] or Radiation Colitis.  --ok to stop abxs --flex sig today-- ?consider steroids  Hypokalemia -- replace orally  Hyponatremia -- likely from diarrhea and poor PO intake -- sodium improving  Cardiomegaly and small pericardial effusion -Patient denied any shortness of breath -Volume status wise patient is volume depleted  Stage IV metastatic signet ring adenocarcinoma -- patient follows with  Dr. Otho Perl see today -- patient getting chemotherapy. She finished radiation therapy  Chronic back pain PRN PO and IV pain meds --Palliative care Josh to adjust pain meds  H/o Buerger's dz Tobacco use --stable    Family communication : Jerome at bedside Consults :GI, Oncology CODE STATUS: full DVT Prophylaxis :lovenox Level of care: Telemetry Medical Status is: Inpatient Remains inpatient appropriate because: acute colitis    TOTAL TIME TAKING CARE OF THIS PATIENT: 35 minutes.  >50% time spent on counselling and coordination of care  Note: This dictation was prepared with Dragon dictation along with smaller phrase  technology. Any transcriptional errors that result from this process are unintentional.  Sheryl Silva M.D    [facility]   CC: Primary care physician; [name], MD

## 2023-07-27 ENCOUNTER — Encounter: Payer: Self-pay | Admitting: Gastroenterology

## 2023-07-27 DIAGNOSIS — K529 Noninfective gastroenteritis and colitis, unspecified: Secondary | ICD-10-CM | POA: Diagnosis not present

## 2023-07-27 DIAGNOSIS — Z515 Encounter for palliative care: Secondary | ICD-10-CM | POA: Diagnosis not present

## 2023-07-27 DIAGNOSIS — C801 Malignant (primary) neoplasm, unspecified: Secondary | ICD-10-CM

## 2023-07-27 DIAGNOSIS — K5150 Left sided colitis without complications: Secondary | ICD-10-CM | POA: Diagnosis not present

## 2023-07-27 LAB — CULTURE, BLOOD (ROUTINE X 2)
Culture: NO GROWTH
Culture: NO GROWTH
Special Requests: ADEQUATE

## 2023-07-27 MED ORDER — HYDROCORTISONE 100 MG/60ML RE ENEM
100.0000 mg | ENEMA | Freq: Every day | RECTAL | Status: DC
  Administered 2023-07-27 – 2023-07-28 (×2): 100 mg via RECTAL
  Filled 2023-07-27 (×2): qty 1

## 2023-07-27 MED ORDER — LOPERAMIDE HCL 2 MG PO CAPS
2.0000 mg | ORAL_CAPSULE | Freq: Four times a day (QID) | ORAL | Status: DC | PRN
  Administered 2023-07-27 – 2023-07-29 (×6): 2 mg via ORAL
  Filled 2023-07-27 (×7): qty 1

## 2023-07-27 NOTE — Plan of Care (Signed)

## 2023-07-27 NOTE — Progress Notes (Signed)
Arlyss Repress, MD [address]  [address]  [place], [place] [address]  Main: [phone]  Fax: [phone] Pager: [phone]   Subjective: She reports having lower abdominal cramps, 4 episodes of diarrhea since this morning, also reports nausea.   Objective: Vital signs in last 24 hours: Vitals:   07/26/23 1748 07/26/23 1808 07/27/23 0230 07/27/23 0731  BP: (!) 137/96 (!) 149/93 136/82 136/83  Pulse: (!) 101 99 90 (!) 110  Resp: (!) 23 20 18 16   Temp:  (!) 97.5 F (36.4 C) 97.8 F (36.6 C) 98.4 F (36.9 C)  TempSrc:  Oral    SpO2: 100% 100% 100% 100%  Weight:      Height:       Weight change:  No intake or output data in the 24 hours ending 07/27/23 1407   Exam: Heart: Tachycardia, regular rhythm Lungs: normal and clear to auscultation Abdomen: Left lower quadrant tenderness   Lab Results:    Latest Ref Rng & Units 07/23/2023    3:26 AM 07/22/2023    2:25 AM 07/20/2023    3:15 PM  CBC  WBC 4.0 - 10.5 K/uL 5.3  5.8  5.4   Hemoglobin 12.0 - 15.0 g/dL 8.9  32.4  9.9   Hematocrit 36.0 - 46.0 % 25.7  31.5  29.4   Platelets 150 - 400 K/uL 51  85  84       Latest Ref Rng & Units 07/23/2023    3:26 AM 07/22/2023    4:25 PM 07/22/2023    2:25 AM  CMP  Glucose 70 - 99 mg/dL 85   401   BUN 6 - 20 mg/dL 12   17   Creatinine 0.27 - 1.00 mg/dL 2.53   6.64   Sodium 403 - 145 mmol/L 130  129  128   Potassium 3.5 - 5.1 mmol/L 3.5   3.4   Chloride 98 - 111 mmol/L 103   99   CO2 22 - 32 mmol/L 18   19   Calcium 8.9 - 10.3 mg/dL 7.7   8.2   Total Protein 6.5 - 8.1 g/dL 5.9   7.2   Total Bilirubin 0.3 - 1.2 mg/dL 0.9   0.9   Alkaline Phos 38 - 126 U/L 247   348   AST 15 - 41 U/L 27   37   ALT 0 - 44 U/L 33   55     Micro Results: Recent Results (from the past 240 hour(s))  Resp panel by RT-PCR (RSV, Flu A&B, Covid) Anterior Nasal Swab     Status: None   Collection Time: 07/22/23  2:25 AM   Specimen: Anterior Nasal Swab  Result Value Ref Range Status    SARS Coronavirus 2 by RT PCR NEGATIVE NEGATIVE Final    Comment: (NOTE) SARS-CoV-2 target nucleic acids are NOT DETECTED.  The SARS-CoV-2 RNA is generally detectable in upper respiratory specimens during the acute phase of infection. The lowest concentration of SARS-CoV-2 viral copies this assay can detect is 138 copies/mL. A negative result does not preclude SARS-Cov-2 infection and should not be used as the sole basis for treatment or other patient management decisions. A negative result may occur with  improper specimen collection/handling, submission of specimen other than nasopharyngeal swab, presence of viral mutation(s) within the areas targeted by this assay, and inadequate number of viral copies(<138 copies/mL). A negative result must be combined with clinical observations, patient history, and epidemiological information. The expected result  is Negative.  Fact Sheet for Patients:  BloggerCourse.com  Fact Sheet for Healthcare Providers:  SeriousBroker.it  This test is no t yet approved or cleared by the [place] FDA and  has been authorized for detection and/or diagnosis of SARS-CoV-2 by FDA under an Emergency Use Authorization (EUA). This EUA will remain  in effect (meaning this test can be used) for the duration of the COVID-19 declaration under Section 564(b)(1) of the Act, 21 U.S.C.section 360bbb-3(b)(1), unless the authorization is terminated  or revoked sooner.       Influenza A by PCR NEGATIVE NEGATIVE Final   Influenza B by PCR NEGATIVE NEGATIVE Final    Comment: (NOTE) The Xpert Xpress SARS-CoV-2/FLU/RSV plus assay is intended as an aid in the diagnosis of influenza from Nasopharyngeal swab specimens and should not be used as a sole basis for treatment. Nasal washings and aspirates are unacceptable for Xpert Xpress SARS-CoV-2/FLU/RSV testing.  Fact Sheet for  Patients: BloggerCourse.com  Fact Sheet for Healthcare Providers: SeriousBroker.it  This test is not yet approved or cleared by the [place] FDA and has been authorized for detection and/or diagnosis of SARS-CoV-2 by FDA under an Emergency Use Authorization (EUA). This EUA will remain in effect (meaning this test can be used) for the duration of the COVID-19 declaration under Section 564(b)(1) of the Act, 21 U.S.C. section 360bbb-3(b)(1), unless the authorization is terminated or revoked.     Resp Syncytial Virus by PCR NEGATIVE NEGATIVE Final    Comment: (NOTE) Fact Sheet for Patients: BloggerCourse.com  Fact Sheet for Healthcare Providers: SeriousBroker.it  This test is not yet approved or cleared by the [place] FDA and has been authorized for detection and/or diagnosis of SARS-CoV-2 by FDA under an Emergency Use Authorization (EUA). This EUA will remain in effect (meaning this test can be used) for the duration of the COVID-19 declaration under Section 564(b)(1) of the Act, 21 U.S.C. section 360bbb-3(b)(1), unless the authorization is terminated or revoked.  Performed at [facility], [address] [place], [place] [address]   Blood Culture (routine x 2)     Status: None   Collection Time: 07/22/23  2:25 AM   Specimen: BLOOD  Result Value Ref Range Status   Specimen Description BLOOD  RAC  Final   Special Requests   Final    BOTTLES DRAWN AEROBIC AND ANAEROBIC Blood Culture results may not be optimal due to an excessive volume of blood received in culture bottles   Culture   Final    NO GROWTH 5 DAYS Performed at [facility], [address], [place], [place] [address]    Report Status 07/27/2023 FINAL  Final  Blood Culture (routine x 2)     Status: None   Collection Time: 07/22/23  2:26 AM   Specimen: BLOOD  Result Value Ref Range  Status   Specimen Description BLOOD  LH  Final   Special Requests   Final    BOTTLES DRAWN AEROBIC AND ANAEROBIC Blood Culture adequate volume   Culture   Final    NO GROWTH 5 DAYS Performed at [facility], [address] [place], [place] [address]    Report Status 07/27/2023 FINAL  Final  C Difficile Quick Screen w PCR reflex     Status: None   Collection Time: 07/22/23 12:19 PM   Specimen: STOOL  Result Value Ref Range Status   C Diff antigen NEGATIVE NEGATIVE Final   C Diff toxin NEGATIVE NEGATIVE Final   C  Diff interpretation No C. difficile detected.  Final    Comment: Performed at [facility], [address] [place], [place] [address]  Gastrointestinal Panel by PCR , Stool     Status: None   Collection Time: 07/22/23 12:19 PM   Specimen: Stool  Result Value Ref Range Status   Campylobacter species NOT DETECTED NOT DETECTED Final   Plesimonas shigelloides NOT DETECTED NOT DETECTED Final   Salmonella species NOT DETECTED NOT DETECTED Final   Yersinia enterocolitica NOT DETECTED NOT DETECTED Final   Vibrio species NOT DETECTED NOT DETECTED Final   Vibrio cholerae NOT DETECTED NOT DETECTED Final   Enteroaggregative E coli (EAEC) NOT DETECTED NOT DETECTED Final   Enteropathogenic E coli (EPEC) NOT DETECTED NOT DETECTED Final   Enterotoxigenic E coli (ETEC) NOT DETECTED NOT DETECTED Final   Shiga like toxin producing E coli (STEC) NOT DETECTED NOT DETECTED Final   Shigella/Enteroinvasive E coli (EIEC) NOT DETECTED NOT DETECTED Final   Cryptosporidium NOT DETECTED NOT DETECTED Final   Cyclospora cayetanensis NOT DETECTED NOT DETECTED Final   Entamoeba histolytica NOT DETECTED NOT DETECTED Final   Giardia lamblia NOT DETECTED NOT DETECTED Final   Adenovirus F40/41 NOT DETECTED NOT DETECTED Final   Astrovirus NOT DETECTED NOT DETECTED Final   Norovirus GI/GII NOT DETECTED NOT DETECTED Final   Rotavirus A NOT DETECTED NOT DETECTED Final   Sapovirus (I, II,  IV, and V) NOT DETECTED NOT DETECTED Final    Comment: Performed at [facility], [address] [place], [place] [address]   Studies/Results: No results found. Medications: I have reviewed the patient's current medications. Prior to Admission:  Medications Prior to Admission  Medication Sig Dispense Refill Last Dose   albuterol (PROVENTIL) (2.5 MG/3ML) 0.083% nebulizer solution Take 2.5 mg by nebulization every 6 (six) hours as needed for wheezing or shortness of breath.   prn at unk   albuterol (VENTOLIN HFA) 108 (90 Base) MCG/ACT inhaler Inhale 2 puffs into the lungs every 6 (six) hours as needed.   07/22/2023   amLODipine (NORVASC) 10 MG tablet Take 10 mg by mouth daily.   07/21/2023   busPIRone (BUSPAR) 5 MG tablet Take 5 mg by mouth 2 (two) times daily.   07/21/2023   cloNIDine (CATAPRES) 0.1 MG tablet Take 1 tablet (0.1 mg total) by mouth daily as needed (Sustained Systolic pressure of 200 or higher). 20 tablet 0 prn at unk   cyanocobalamin (VITAMIN B12) 500 MCG tablet Take 500 mcg by mouth daily.   07/21/2023   Doxepin HCl 3 MG TABS Take 1 tablet by mouth at bedtime.   07/21/2023   DULoxetine (CYMBALTA) 60 MG capsule Take 60 mg by mouth daily.   07/21/2023   gabapentin (NEURONTIN) 300 MG capsule Take 300 mg by mouth 3 (three) times daily.   07/21/2023   hydrocortisone 2.5 % cream Apply 1 Application topically 2 (two) times daily.   07/21/2023   lidocaine-prilocaine (EMLA) cream Apply to affected area once 30 g 3 07/21/2023   megestrol (MEGACE) 40 MG tablet TAKE 1 TABLET BY MOUTH EVERY DAY 30 tablet 2 07/21/2023   Multiple Vitamin (MULTIVITAMIN) tablet Take 1 tablet by mouth daily.   Past Week   naloxone (NARCAN) nasal spray 4 mg/0.1 mL Place 1 spray into the nose once.   prn at unk   omeprazole (PRILOSEC) 20 MG capsule Take 20 mg by mouth daily.   Past Week   ondansetron (ZOFRAN) 8 MG tablet Take 1 tablet (8  mg total) by mouth every 8 (eight) hours as needed for nausea or vomiting.  Start on the third day after chemotherapy. 60 tablet 2 07/21/2023   oxyCODONE-acetaminophen (PERCOCET) 10-325 MG tablet Take 1 tablet by mouth every 4 (four) hours as needed.   07/21/2023   polyethylene glycol (MIRALAX / GLYCOLAX) 17 g packet Take 17 g by mouth daily.   Past Week   prochlorperazine (COMPAZINE) 10 MG tablet Take 1 tablet (10 mg total) by mouth every 6 (six) hours as needed for nausea or vomiting. 60 tablet 2 Past Week   SENEXON-S 8.6-50 MG tablet Take 1 tablet by mouth 2 (two) times daily.   Past Week   tiZANidine (ZANAFLEX) 4 MG tablet Take 4 mg by mouth 2 (two) times daily.   Past Week   topiramate (TOPAMAX) 25 MG tablet Take 25 mg by mouth 2 (two) times daily.   07/21/2023   traMADol (ULTRAM-ER) 300 MG 24 hr tablet Take 300 mg by mouth daily.   07/21/2023   Vitamin D, Ergocalciferol, (DRISDOL) 1.25 MG (50000 UNIT) CAPS capsule Take 50,000 Units by mouth every 7 (seven) days.   07/16/2023   Scheduled:  acidophilus  2 capsule Oral Daily   busPIRone  5 mg Oral BID   cyanocobalamin  500 mcg Oral Daily   DULoxetine  60 mg Oral Daily   enoxaparin (LOVENOX) injection  40 mg Subcutaneous Q24H   gabapentin  300 mg Oral TID   hydrocortisone   Topical BID   lactobacillus  1 g Oral TID WC   lactose free nutrition  237 mL Oral TID WC   megestrol  40 mg Oral Daily   multivitamin with minerals  1 tablet Oral Daily   pantoprazole  40 mg Oral Daily   potassium chloride  40 mEq Oral Once   predniSONE  40 mg Oral Q breakfast   Vitamin D (Ergocalciferol)  50,000 Units Oral Q7 days   Continuous:  sodium chloride Stopped (07/25/23 1757)   ZOX:WRUEAV chloride, acetaminophen, albuterol, dicyclomine, loperamide, morphine injection, ondansetron (ZOFRAN) IV, oxyCODONE-acetaminophen **AND** oxyCODONE, traMADol, traZODone Anti-infectives (From admission, onward)    Start     Dose/Rate Route Frequency Ordered Stop   07/24/23 2200  amoxicillin-clavulanate (AUGMENTIN) 875-125 MG per tablet 1 tablet   Status:  Discontinued        1 tablet Oral Every 12 hours 07/24/23 1427 07/24/23 1832   07/24/23 1845  Ampicillin-Sulbactam (UNASYN) 3 g in sodium chloride 0.9 % 100 mL IVPB  Status:  Discontinued        3 g 200 mL/hr over 30 Minutes Intravenous Every 6 hours 07/24/23 1833 07/25/23 1826   07/22/23 1500  metroNIDAZOLE (FLAGYL) IVPB 500 mg  Status:  Discontinued        500 mg 100 mL/hr over 60 Minutes Intravenous Every 12 hours 07/22/23 0735 07/24/23 1427   07/22/23 1000  cefTRIAXone (ROCEPHIN) 2 g in sodium chloride 0.9 % 100 mL IVPB  Status:  Discontinued        2 g 200 mL/hr over 30 Minutes Intravenous Every 24 hours 07/22/23 0735 07/24/23 1427   07/22/23 0330  vancomycin (VANCOCIN) IVPB 750 mg/150 ml premix       Placed in "And" Linked Group   750 mg 150 mL/hr over 60 Minutes Intravenous  Once 07/22/23 0225 07/22/23 0641   07/22/23 0230  vancomycin (VANCOCIN) IVPB 1000 mg/200 mL premix       Placed in "And" Linked Group   1,000 mg 200 mL/hr  over 60 Minutes Intravenous  Once 07/22/23 0225 07/22/23 0544   07/22/23 0215  ceFEPIme (MAXIPIME) 2 g in sodium chloride 0.9 % 100 mL IVPB        2 g 200 mL/hr over 30 Minutes Intravenous  Once 07/22/23 0206 07/22/23 0301   07/22/23 0215  metroNIDAZOLE (FLAGYL) IVPB 500 mg        500 mg 100 mL/hr over 60 Minutes Intravenous  Once 07/22/23 0206 07/22/23 0433   07/22/23 0215  vancomycin (VANCOCIN) IVPB 1000 mg/200 mL premix  Status:  Discontinued        1,000 mg 200 mL/hr over 60 Minutes Intravenous  Once 07/22/23 0206 07/22/23 0210   07/22/23 0215  vancomycin (VANCOCIN) 1,750 mg in sodium chloride 0.9 % 500 mL IVPB  Status:  Discontinued        1,750 mg 258.8 mL/hr over 120 Minutes Intravenous  Once 07/22/23 0210 07/22/23 0223      Scheduled Meds:  acidophilus  2 capsule Oral Daily   busPIRone  5 mg Oral BID   cyanocobalamin  500 mcg Oral Daily   DULoxetine  60 mg Oral Daily   enoxaparin (LOVENOX) injection  40 mg Subcutaneous Q24H    gabapentin  300 mg Oral TID   hydrocortisone   Topical BID   lactobacillus  1 g Oral TID WC   lactose free nutrition  237 mL Oral TID WC   megestrol  40 mg Oral Daily   multivitamin with minerals  1 tablet Oral Daily   pantoprazole  40 mg Oral Daily   potassium chloride  40 mEq Oral Once   predniSONE  40 mg Oral Q breakfast   Vitamin D (Ergocalciferol)  50,000 Units Oral Q7 days   Continuous Infusions:  sodium chloride Stopped (07/25/23 1757)   PRN Meds:.sodium chloride, acetaminophen, albuterol, dicyclomine, loperamide, morphine injection, ondansetron (ZOFRAN) IV, oxyCODONE-acetaminophen **AND** oxyCODONE, traMADol, traZODone   Assessment: Principal Problem:   Colitis Active Problems:   Adenocarcinoma (HCC)   Left sided colitis without complications (HCC)   Palliative care encounter  Rawda Benally is a 47 y.o. 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   Plan: Acute diarrhea: Somewhat improving Infectious etiology negative CT revealed left-sided colitis flexible sigmoidoscopy on 9/24 revealed mild inflammation in the sigmoid colon and rectum, biopsies performed from transverse colon, descending colon, sigmoid colon as well as rectum, pathology results pending Differentials include Keytruda induced colitis or radiation colitis Initiated on prednisone 40 mg daily, can try Cortenema at bedtime Diet as tolerated Okay to take Imodium or Lomotil as needed for diarrhea Bentyl or hyoscyamine as needed for abdominal cramps  GI will sign off at this time, please call us back with questions or concerns   LOS: 5 days   Ellery Meroney 07/27/2023, 2:07 PM

## 2023-07-27 NOTE — Progress Notes (Signed)
[facility]  [phone] [phone]  ID: Sheryl Silva OB: 10/08/1976  MR#: [id number]  JJO#:[id number]  Patient Care Team: [name], MD as PCP - General (Internal Medicine) Jim Like, RN as Registered Nurse Scarlett Presto, RN (Inactive) as Registered Nurse Benita Gutter, RN as Oncology Nurse Navigator Jeralyn Ruths, MD as Consulting Physician (Oncology)  CHIEF COMPLAINT: Stage IV signet ring cell carcinoma, colitis.  INTERVAL HISTORY: Patient feels improved today and was able to tolerate p.o. without nausea or vomiting.  She continues to have diarrhea, but states this has improved as well.  Pain is still uncontrolled but improved.  REVIEW OF SYSTEMS:   Review of Systems  Constitutional:  Positive for malaise/fatigue. Negative for fever and weight loss.  Respiratory: Negative.  Negative for cough, hemoptysis and shortness of breath.   Cardiovascular: Negative.  Negative for chest pain and leg swelling.  Gastrointestinal:  Positive for abdominal pain and diarrhea.  Genitourinary: Negative.  Negative for dysuria.  Musculoskeletal: Negative.  Negative for back pain.  Skin: Negative.  Negative for rash.  Neurological:  Positive for weakness. Negative for dizziness, focal weakness and headaches.  Psychiatric/Behavioral: Negative.  The patient is not nervous/anxious.     As per HPI. Otherwise, a complete review of systems is negative.  PAST MEDICAL HISTORY: Past Medical History:  Diagnosis Date   Anxiety    Cancer (HCC)    Depression    Heart murmur    Hypertension     PAST SURGICAL HISTORY: Past Surgical History:  Procedure Laterality Date   CESAREAN SECTION     COLONOSCOPY WITH PROPOFOL N/A 02/21/2023   Procedure: COLONOSCOPY WITH PROPOFOL;  Surgeon: Regis Bill, MD;  Location: [facility] ENDOSCOPY;  Service: Endoscopy;  Laterality: N/A;   DILATION AND CURETTAGE OF UTERUS     IR IMAGING GUIDED PORT  INSERTION  02/16/2023    FAMILY HISTORY: Family History  Problem Relation Age of Onset   Cancer Maternal Grandmother     ADVANCED DIRECTIVES (Y/N):  @ADVDIR @  HEALTH MAINTENANCE: Social History   Tobacco Use   Smoking status: Former    Current packs/day: 0.00    Types: Cigarettes    Quit date: 12/19/2022    Years since quitting: 0.6   Smokeless tobacco: Never   Tobacco comments:    Quit smoking 12/2022  Vaping Use   Vaping status: Never Used  Substance Use Topics   Alcohol use: Yes    Comment: occ   Drug use: No     Colonoscopy:  PAP:  Bone density:  Lipid panel:  Allergies  Allergen Reactions   Nifedipine Palpitations    Current Facility-Administered Medications  Medication Dose Route Frequency Provider Last Rate Last Admin   0.9 %  sodium chloride infusion   Intravenous PRN Enedina Finner, MD   Stopped at 07/25/23 1757   acetaminophen (TYLENOL) tablet 650 mg  650 mg Oral Q6H PRN Mikey College T, MD   650 mg at 07/26/23 1035   acidophilus (RISAQUAD) capsule 2 capsule  2 capsule Oral Daily Enedina Finner, MD   2 capsule at 07/26/23 0854   albuterol (PROVENTIL) (2.5 MG/3ML) 0.083% nebulizer solution 2.5 mg  2.5 mg Nebulization Q4H PRN Manuela Schwartz, NP   2.5 mg at 07/24/23 2001   busPIRone (BUSPAR) tablet 5 mg  5 mg Oral BID Enedina Finner, MD   5 mg at 07/26/23 0855   cyanocobalamin (VITAMIN B12) tablet 500 mcg  500 mcg Oral Daily Enedina Finner,  MD   500 mcg at 07/26/23 0855   dicyclomine (BENTYL) tablet 20 mg  20 mg Oral TID PRN Toney Reil, MD       DULoxetine (CYMBALTA) DR capsule 60 mg  60 mg Oral Daily Enedina Finner, MD   60 mg at 07/26/23 0856   enoxaparin (LOVENOX) injection 40 mg  40 mg Subcutaneous Q24H Mikey College T, MD   40 mg at 07/26/23 2104   gabapentin (NEURONTIN) capsule 300 mg  300 mg Oral TID Mikey College T, MD   300 mg at 07/26/23 1811   hydrocortisone (ANUSOL-HC) 2.5 % rectal cream   Topical BID Angelique Blonder, [facility]   Given at 07/26/23 0856    lactobacillus (FLORANEX/LACTINEX) granules 1 g  1 g Oral TID WC Mikey College T, MD   1 g at 07/26/23 0854   lactose free nutrition (BOOST PLUS) liquid 237 mL  237 mL Oral TID WC Enedina Finner, MD   237 mL at 07/26/23 1810   loperamide (IMODIUM) capsule 2 mg  2 mg Oral Q6H PRN Delfino Lovett, MD   2 mg at 07/27/23 1052   megestrol (MEGACE) tablet 40 mg  40 mg Oral Daily Mikey College T, MD   40 mg at 07/26/23 0853   morphine (PF) 4 MG/ML injection 4 mg  4 mg Intravenous Q4H PRN Borders, Daryl Eastern, NP   4 mg at 07/27/23 1044   multivitamin with minerals tablet 1 tablet  1 tablet Oral Daily Enedina Finner, MD   1 tablet at 07/26/23 0855   ondansetron (ZOFRAN) injection 4 mg  4 mg Intravenous Q6H PRN Mikey College T, MD   4 mg at 07/27/23 1007   oxyCODONE-acetaminophen (PERCOCET/ROXICET) 5-325 MG per tablet 1-2 tablet  1-2 tablet Oral Q4H PRN Enedina Finner, MD       And   oxyCODONE (Oxy IR/ROXICODONE) immediate release tablet 5 mg  5 mg Oral Q4H PRN Enedina Finner, MD       pantoprazole (PROTONIX) EC tablet 40 mg  40 mg Oral Daily Mikey College T, MD   40 mg at 07/26/23 0855   potassium chloride SA (KLOR-CON M) CR tablet 40 mEq  40 mEq Oral Once Mikey College T, MD       predniSONE (DELTASONE) tablet 40 mg  40 mg Oral Q breakfast Toney Reil, MD   40 mg at 07/27/23 1045   traMADol (ULTRAM) tablet 50 mg  50 mg Oral Q12H PRN Enedina Finner, MD   50 mg at 07/25/23 0250   traZODone (DESYREL) tablet 25 mg  25 mg Oral QHS PRN Mansy, Jan A, MD   25 mg at 07/26/23 2209   Vitamin D (Ergocalciferol) (DRISDOL) 1.25 MG (50000 UNIT) capsule 50,000 Units  50,000 Units Oral Q7 days Enedina Finner, MD   50,000 Units at 07/24/23 2034   Facility-Administered Medications Ordered in Other Encounters  Medication Dose Route Frequency Provider Last Rate Last Admin   sodium chloride flush (NS) 0.9 % injection 10 mL  10 mL Intracatheter PRN Jeralyn Ruths, MD   10 mL at 06/29/23 1430    OBJECTIVE: Vitals:   07/27/23 0230 07/27/23 0731   BP: 136/82 136/83  Pulse: 90 (!) 110  Resp: 18 16  Temp: 97.8 F (36.6 C) 98.4 F (36.9 C)  SpO2: 100% 100%     Body mass index is 33.2 kg/m.    ECOG FS:2 - Symptomatic, <50% confined to bed  General: Well-developed, well-nourished, no acute distress. Eyes:  Pink conjunctiva, anicteric sclera. HEENT: Normocephalic, moist mucous membranes. Lungs: No audible wheezing or coughing. Heart: Regular rate and rhythm. Abdomen: Soft, nontender, no obvious distention. Musculoskeletal: No edema, cyanosis, or clubbing. Neuro: Alert, answering all questions appropriately. Cranial nerves grossly intact. Skin: No rashes or petechiae noted. Psych: Normal affect. Lymphatics: No cervical, calvicular, axillary or inguinal LAD.   LAB RESULTS:  Lab Results  Component Value Date   NA 130 (L) 07/23/2023   K 3.5 07/23/2023   CL 103 07/23/2023   CO2 18 (L) 07/23/2023   GLUCOSE 85 07/23/2023   BUN 12 07/23/2023   CREATININE 0.57 07/23/2023   CALCIUM 7.7 (L) 07/23/2023   PROT 5.9 (L) 07/23/2023   ALBUMIN 2.9 (L) 07/23/2023   AST 27 07/23/2023   ALT 33 07/23/2023   ALKPHOS 247 (H) 07/23/2023   BILITOT 0.9 07/23/2023   GFRNONAA >60 07/23/2023   GFRAA >60 09/27/2018    Lab Results  Component Value Date   WBC 5.3 07/23/2023   NEUTROABS 3.9 07/22/2023   HGB 8.9 (L) 07/23/2023   HCT 25.7 (L) 07/23/2023   MCV 92.8 07/23/2023   PLT 51 (L) 07/23/2023     STUDIES: US PELVIC COMPLETE W TRANSVAGINAL AND TORSION R/O  Result Date: 07/22/2023 CLINICAL DATA:  [id number] with pelvic pain for 5 days. History of metastatic adrenal carcinoma and steadily enlarging left adnexal/pelvic mass on CT with a normal left ovary not able to be distinguished from it. There was also evidence of distal descending and proximal to mid sigmoid colitis/diverticulitis on CT today. EXAM: TRANSABDOMINAL AND TRANSVAGINAL ULTRASOUND OF PELVIS DOPPLER ULTRASOUND OF OVARIES TECHNIQUE: Both transabdominal and transvaginal ultrasound  examinations of the pelvis were performed. Transabdominal technique was performed for global imaging of the pelvis including uterus, ovaries, adnexal regions, and pelvic cul-de-sac. It was necessary to proceed with endovaginal exam following the transabdominal exam to visualize the uterus, right ovary and endometrium better. Color and duplex Doppler ultrasound was utilized to evaluate blood flow to the ovaries. COMPARISON:  CT with IV contrast today, PET-CT 07/06/2023, and earlier CT studies from this year back to 12/18/2022. FINDINGS: Uterus Measurements: Anteverted and heterogeneous measuring 7.2 x 3.8 x 4.3 cm = volume: 61.2 mL. No fibroids or other mass visualized. Incidentally noted is a 2 cm simple nabothian cysts in the cervix. Despite myometrial heterogeneity there is no appreciable focal wall mass. Endometrium Thickness: 5.7 mm.  No focal abnormality visualized. Right ovary Measurements: 3.0 x 1.4 x 2.0 = volume: 4.3 mL. Normal appearance/no adnexal mass. Left ovary There is a heterogeneous hypervascular left adnexal mass which is inseparable and indistinguishable from the left ovary. On CT this measured 5.4 x 4.8 cm. Ultrasound is 5.5 x 4.8 x 5.2 cm and 72 mL. 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 Pulsed Doppler evaluation of both ovaries demonstrates normal low-resistance arterial and venous waveforms. Other findings Mild anechoic free fluid, which was also seen on CT. IMPRESSION: Heterogeneous hypervascular left adnexal mass which is inseparable from the left ovary. 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 Right ovary, uterus and  endometrium are essentially unremarkable with incidental 2 cm simple nabothian cervical cyst. Mildly heterogeneous myometrium. Mild free  fluid, nonspecific. Electronically Signed   By: Almira Bar M.D.   On: 07/22/2023 06:01   CT Angio Chest PE W and/or Wo Contrast  Result Date: 07/22/2023 CLINICAL DATA:  Weakness, emesis and diarrhea for the last several days. Currently taking chemotherapy and radiation for metastatic adrenal carcinoma. EXAM: CT ANGIOGRAPHY CHEST CT ABDOMEN AND PELVIS WITH CONTRAST TECHNIQUE: Multidetector CT imaging of the chest was performed using the standard protocol during bolus administration of intravenous contrast. Multiplanar CT image reconstructions and MIPs were obtained to evaluate the vascular anatomy. Multidetector CT imaging of the abdomen and pelvis was performed using the standard protocol during bolus administration of intravenous contrast. RADIATION DOSE REDUCTION: This exam was performed according to the departmental dose-optimization program which includes automated exposure control, adjustment of the mA and/or kV according to patient size and/or use of iterative reconstruction technique. CONTRAST:  OMNIPAQUE IOHEXOL 350 MG/ML SOLN COMPARISON:  Portable chest today, PET-CT 07/06/2023, chest, abdomen and pelvis outside CT images only dated 02/03/2023. FINDINGS: CTA CHEST FINDINGS Cardiovascular: Pulmonary arteries are normal in caliber and do not show embolic filling defects. There is minimal atherosclerosis in the aortic arch. There is no aneurysm, stenosis or dissection. The great vessels are clear.  The pulmonary veins are nondistended. There is mild cardiomegaly with a left chamber predominance. There is a right chest port with IJ approach catheter with tip in the right atrium. There is a small pericardial effusion new from 07/06/2023. No visible coronary calcific plaques. Mediastinum/Nodes: Lower paratracheal space node measuring 8 mm in short axis on 4:45 and  adjacent right-sided precarinal lymph node also 8 mm short axis. Subcarinal lymph node up to 9 mm short axis. All of these with uptake on PET-CT but no bulky adenopathy. No hilar adenopathy. In the left axilla multiple enlarged lymph nodes again are noted, with multifocal PET activity. Largest of these is 1.4 x 3.3 cm on 4:17, 2 x 1.5 cm on 4:20 and 2 x 1.5 cm on 4:31. There has been no change since 07/06/2023. There is an enlarged right axillary node measuring 1.3 x 1.8 cm on 4:34. Right cardiophrenic angle lymph nodes largest of these is 9 mm in short axis on 4:95. Lungs/Pleura: Mildly elevated right hemidiaphragm. Unchanged 6 mm pleural-based posterior left upper lobe nodule on 5:36. Unchanged 5 mm pleural-based nodule anteriorly in the left upper lobe on 5:49. There are no other appreciable nodules. There is no consolidation, effusion or pneumothorax. 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 There are scattered thoracic spine endplate Schmorl's nodes but no acute compression fracture. Findings are more confluent and widespread than on 02/03/2023. Review of the MIP images confirms the above findings. CT ABDOMEN and PELVIS FINDINGS Hepatobiliary: No hepatic mass enhancement. Mild hepatic steatosis. Cholelithiasis without evidence of cholecystitis or biliary dilatation. Pancreas: No abnormality. Spleen: No abnormality. 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 Both kidneys enhance homogeneously. There is no urinary stone or obstruction. No bladder thickening. Stomach/Bowel: There is fluid in the colon in the ascending and transverse segments. There are increasing thickened folds in the distal descending and proximal/mid sigmoid colon and adjacent stranding consistent with colitis or diverticulitis. There is no wall pneumatosis, free air, or abscess. Vascular/Lymphatic: Small hypermetabolic lymph nodes were noted on the recent  PET-CT and have not changed. Examples include an aortocaval lymph node 7 mm short axis on 11: 39, left external iliac chain node is 9 mm short axis on 11:77, with scattered  subcentimeter hypermetabolic inguinal lymph nodes also recently seen. By strict size criteria, majority of the hypermetabolic nodes are not frankly enlarged. There are slightly prominent left periaortic chain nodes up to 1 cm in short axis. Large heterogeneous left adnexal/pelvic mass again measures 5.4 x 4.8 cm on 11:72. This is about double the size it was on an earlier study this year on 12/18/2022. Reproductive: The uterus is intact. No new adnexal abnormality. 2 cm transmural fibroid of the right side of the fundus. Other: Small volume of pelvic ascites. No free hemorrhage, free air or localizing collections. 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 No visible pathologic regional fracture. Review of the MIP images confirms the above findings. IMPRESSION: 1. No evidence of arterial embolus or acute chest process. 2. Small pericardial effusion, new from 07/06/2023. 3. Cardiomegaly without evidence of CHF. 4. Unchanged 6 mm and 5 mm left upper lobe nodules. 5. Bilateral axillary adenopathy, left greater than right, with borderline to slightly prominent mediastinal, retroperitoneal and pelvic lymph nodes. Positive PET activity on recent PET-CT. 6. 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 7. Increasing thickened folds in the distal descending and proximal/mid sigmoid colon with adjacent stranding consistent with colitis or diverticulitis. No wall pneumatosis, free air, or abscess. 8. 5.4 x 4.8 cm heterogeneous left adnexal/pelvic mass almost certainly metastatic, about double the size it was on an earlier study this year on 12/18/2022. 9. Small volume of pelvic ascites.  No free air. 10. Cholelithiasis without evidence of cholecystitis. 11. Mild hepatic steatosis.  Electronically Signed   By: Almira Bar M.D.   On: 07/22/2023 04:40   CT ABDOMEN PELVIS W CONTRAST  Result Date: 07/22/2023 CLINICAL DATA:  Weakness, emesis and diarrhea for the last several days. Currently taking chemotherapy and radiation for metastatic adrenal carcinoma. EXAM: CT ANGIOGRAPHY CHEST CT ABDOMEN AND PELVIS WITH CONTRAST TECHNIQUE: Multidetector CT imaging of the chest was performed using the standard protocol during bolus administration of intravenous contrast. Multiplanar CT image reconstructions and MIPs were obtained to evaluate the vascular anatomy. Multidetector CT imaging of the abdomen and pelvis was performed using the standard protocol during bolus administration of intravenous contrast. RADIATION DOSE REDUCTION: This exam was performed according to the departmental dose-optimization program which includes automated exposure control, adjustment of the mA and/or kV according to patient size and/or use of iterative reconstruction technique. CONTRAST:  OMNIPAQUE IOHEXOL 350 MG/ML SOLN COMPARISON:  Portable chest today, PET-CT 07/06/2023, chest, abdomen and pelvis outside CT images only dated 02/03/2023. FINDINGS: CTA CHEST FINDINGS Cardiovascular: Pulmonary arteries are normal in caliber and do not show embolic filling defects. There is minimal atherosclerosis in the aortic arch. There is no aneurysm, stenosis or dissection. The great vessels are clear.  The pulmonary veins are nondistended. There is mild cardiomegaly with a left chamber predominance. There is a right chest port with IJ approach catheter with tip in the right atrium. There is a small pericardial effusion new from 07/06/2023. No visible coronary calcific plaques. Mediastinum/Nodes: Lower paratracheal space node measuring 8 mm in short axis on 4:45 and adjacent right-sided precarinal lymph node also 8 mm short axis. Subcarinal lymph node up to 9 mm short axis. All of these with uptake on PET-CT but no bulky  adenopathy. No hilar adenopathy. In the left axilla multiple enlarged lymph nodes again are noted, with multifocal PET activity. Largest of these is 1.4 x 3.3 cm on 4:17, 2 x 1.5 cm on 4:20 and 2 x 1.5 cm on  4:31. There has been no change since 07/06/2023. There is an enlarged right axillary node measuring 1.3 x 1.8 cm on 4:34. Right cardiophrenic angle lymph nodes largest of these is 9 mm in short axis on 4:95. Lungs/Pleura: Mildly elevated right hemidiaphragm. Unchanged 6 mm pleural-based posterior left upper lobe nodule on 5:36. Unchanged 5 mm pleural-based nodule anteriorly in the left upper lobe on 5:49. There are no other appreciable nodules. There is no consolidation, effusion or pneumothorax. 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 There are scattered thoracic spine endplate Schmorl's nodes but no acute compression fracture. Findings are more confluent and widespread than on 02/03/2023. Review of the MIP images confirms the above findings. CT ABDOMEN and PELVIS FINDINGS Hepatobiliary: No hepatic mass enhancement. Mild hepatic steatosis. Cholelithiasis without evidence of cholecystitis or biliary dilatation. Pancreas: No abnormality. Spleen: No abnormality. 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 Both kidneys enhance homogeneously. There is no urinary stone or obstruction. No bladder thickening. Stomach/Bowel: There is fluid in the colon in the ascending and transverse segments. There are increasing thickened folds in the distal descending and proximal/mid sigmoid colon and adjacent stranding consistent with colitis or diverticulitis. There is no wall pneumatosis, free air, or abscess. Vascular/Lymphatic: Small hypermetabolic lymph nodes were noted on the recent PET-CT and have not changed. Examples include an aortocaval lymph node 7 mm short axis on 11: 39, left external iliac chain node is 9 mm short axis on 11:77,  with scattered subcentimeter hypermetabolic inguinal lymph nodes also recently seen. By strict size criteria, majority of the hypermetabolic nodes are not frankly enlarged. There are slightly prominent left periaortic chain nodes up to 1 cm in short axis. Large heterogeneous left adnexal/pelvic mass again measures 5.4 x 4.8 cm on 11:72. This is about double the size it was on an earlier study this year on 12/18/2022. Reproductive: The uterus is intact. No new adnexal abnormality. 2 cm transmural fibroid of the right side of the fundus. Other: Small volume of pelvic ascites. No free hemorrhage, free air or localizing collections. 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 No visible pathologic regional fracture. Review of the MIP images confirms the above findings. IMPRESSION: 1. No evidence of arterial embolus or acute chest process. 2. Small pericardial effusion, new from 07/06/2023. 3. Cardiomegaly without evidence of CHF. 4. Unchanged 6 mm and 5 mm left upper lobe nodules. 5. Bilateral axillary adenopathy, left greater than right, with borderline to slightly prominent mediastinal, retroperitoneal and pelvic lymph nodes. Positive PET activity on recent PET-CT. 6. 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 7. Increasing thickened folds in the distal descending and proximal/mid sigmoid colon with adjacent stranding consistent with colitis or diverticulitis. No wall pneumatosis, free air, or abscess. 8. 5.4 x 4.8 cm heterogeneous left adnexal/pelvic mass almost certainly metastatic, about double the size it was on an earlier study this year on 12/18/2022. 9. Small volume of pelvic ascites.  No free air. 10. Cholelithiasis without evidence of cholecystitis. 11. Mild hepatic steatosis. Electronically Signed   By: Almira Bar M.D.   On: 07/22/2023 04:40   DG Chest Port 1 View  Result Date: 07/22/2023 CLINICAL DATA:  Generalized weakness  for several days, history of adrenal carcinoma EXAM: PORTABLE CHEST 1 VIEW COMPARISON:  PET-CT from 07/06/2019 FINDINGS: Cardiac shadow is within normal limits. Lungs are well aerated bilaterally. Right chest wall port is noted. Bony structures demonstrate increased sclerosis consistent with metastatic disease. IMPRESSION:  Changes consistent with bony metastatic disease. These are stable from prior PET-CT. No acute abnormality noted. Electronically Signed   By: Alcide Clever M.D.   On: 07/22/2023 02:50   NM PET Image Restag (PS) Skull Base To Thigh  Result Date: 07/15/2023 CLINICAL DATA:  Subsequent treatment strategy for adrenal carcinoma. EXAM: NUCLEAR MEDICINE PET SKULL BASE TO THIGH TECHNIQUE: 9.6 mCi F-18 FDG was injected intravenously. Full-ring PET imaging was performed from the skull base to thigh after the radiotracer. CT data was obtained and used for attenuation correction and anatomic localization. Fasting blood glucose: 93 mg/dl COMPARISON:  16/08/9603. FINDINGS: Mediastinal blood pool activity: SUV max 2.3 Liver activity: SUV max NA NECK: Worsening cervical hypermetabolic adenopathy. Index posterior left level II/III lymph node measures 8 mm (7/25), SUV max 6.1, previously 4 mm and 2.2. Incidental CT findings: None. CHEST: Hypermetabolic mediastinal and axillary lymph nodes, progressive. Index left axillary lymph node measures 13 mm (7/47), SUV max 7.9, compared to 10 mm and 6.8. Incidental CT findings: Right IJ Port-A-Cath terminates in the right atrium. Heart is enlarged. No pericardial or pleural effusion. ABDOMEN/PELVIS: Similar hypermetabolic abdominal retroperitoneal lymph nodes. Index aortocaval lymph node measures 8 mm (7/96), SUV max 4.5. Progressive pelvic retroperitoneal lymph nodes. Index left external iliac lymph node measures 12 mm (7/134), SUV max 8.2, previously 10 mm and 2.4. Additional small hypermetabolic inguinal lymph nodes. Enlarging left adnexal mass now measures 4.5 x 5.5 cm  (7/128), SUV max similar at 3.3, previously 4.4 x 4.7 cm. Incidental CT findings: Liver is grossly unremarkable. Gallstones. Adrenal glands, kidneys, spleen, pancreas, stomach and bowel are grossly unremarkable. SKELETON: Mottled sclerosis and hypermetabolism throughout the visualized osseous structures, similar to the prior exam. Index hypermetabolism in the proximal right femur, SUV max 8.3, compared to 9.3. Incidental CT findings: None. IMPRESSION: 1. Slight interval progression in metastatic disease as evidenced by enlarging and/or increasing hypermetabolic lymph nodes in the neck, chest and pelvis. Abdominal retroperitoneal lymph nodes and osseous metastatic disease appear similar. 2. Enlarging and mildly hypermetabolic left adnexal mass is worrisome for a metastasis. Previous ultrasound 02/24/2023. 3. Cholelithiasis. Electronically Signed   By: Leanna Battles M.D.   On: 07/15/2023 13:06    ASSESSMENT: Stage IV signet ring cell carcinoma, colitis.  PLAN:    Stage IV signet ring cell carcinoma: Patient last received chemotherapy and immunotherapy with FOLFOX plus Keytruda on July 18, 2023.  Her next treatment is not scheduled until August 08, 2023.  Colitis does not appear to be related to [facility].  She has been instructed keep this follow-up as scheduled. Colitis: Flex sigmoidoscopy results noted.  Appreciate GI input.  Given localized area, this appears to be more related to radiation than Keytruda or infectious.  Patient initiated prednisone yesterday.  Can consider enema as well.   Hyponatremia: Patient's last sodium level was 130.   Anemia: Patient's most recent hemoglobin is 8.9.  Transfuse if falls below 7.0. Thrombocytopenia: Platelet count trending down and is now 51.  Secondary to chemotherapy.  Monitor. Disposition: Okay to discharge to home from an oncology standpoint if safe and patient continues to tolerate p.o.   Appreciate consult, will follow.  Jeralyn Ruths, MD    07/27/2023 1:21 PM

## 2023-07-27 NOTE — Progress Notes (Signed)
[facility]  - [place] [facility]   PATIENT NAME: Sheryl Silva    MR#:  [id number]  DATE OF BIRTH:  [birth year]  SUBJECTIVE:  Still having diarrhea and abdominal pain, Jerome/boyfriend at bedside VITALS:  Blood pressure 136/83, pulse (!) 110, temperature 98.4 F (36.9 C), resp. rate 16, height 5' (1.524 m), weight 77.1 kg, SpO2 100%.  PHYSICAL EXAMINATION:   GENERAL:  47 y.o.-year-old patient with no acute distress.  LUNGS: Normal breath sounds bilaterally CARDIOVASCULAR: S1, S2 normal. No murmur   ABDOMEN: Soft, diffusely tender, nondistended. EXTREMITIES: No  edema b/l.    NEUROLOGIC: nonfocal  patient is alert and awake  LABORATORY PANEL:  CBC Recent Labs  Lab 07/23/23 0326  WBC 5.3  HGB 8.9*  HCT 25.7*  PLT 51*    Chemistries  Recent Labs  Lab 07/20/23 1515 07/22/23 0225 07/23/23 0326  NA 127*   < > 130*  K 3.4*   < > 3.5  CL 101   < > 103  CO2 20*   < > 18*  GLUCOSE 142*   < > 85  BUN 24*   < > 12  CREATININE 0.53   < > 0.57  CALCIUM 8.6*   < > 7.7*  MG 2.0  --   --   AST 81*   < > 27  ALT 105*   < > 33  ALKPHOS 367*   < > 247*  BILITOT 0.7   < > 0.9   < > = values in this interval not displayed.    RADIOLOGY:  No results found.  Assessment and Plan  Sheryl Silva is a 47 y.o. 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  Patient was diagnosed with stage IV metastatic cancer initially was with on no arranging, pathology from axillary lymph node showed possible GI source.  Patient was started on chemo and radiation therapy with mFLFOX6 q. 14 days and so far she completed 7 cycles.  She said that with each cycle of chemotherapy she has had mild diarrhea but was never severe and or self-limiting.  Last chemotherapy and radiation was 9/16-17 Monday, patient  started develop severe cramping pain in the left lower quadrant for 2 days then started to have a severe diarrhea,   CTA negative for PE, extensive evidence of metastatic cancer involving the bones, lymph nodes.  Acute colitis versus diverticulitis descending and sigmoid colon.  Left adnexal pelvic mass.  Pelvic ultrasound showed heterogeneous hypervascular left adnexal mass probably metastatic in nature.   Sepsis due to acute colitis -- patient came in with abdominal pain was found to be tachycardic tachypnea with abnormal CT scan -- IV and PO PRN pain meds -- PRN Imodium for diarrhea --BC negative --stool studies negative -- Likely due to radiation versus Keytruda  --flex sig showed colitis, mild, started her on prednisone 40mg  by GI.  Biopsy pending -After discussion with oncology and GI patient started on steroid enema/Cortenema  Hypokalemia -- Replete and resolved  Hyponatremia -- likely from diarrhea and poor PO intake -- sodium improving.  Will check labs tomorrow  Cardiomegaly and small pericardial effusion -Patient denied any shortness of breath  Stage IV metastatic signet ring adenocarcinoma -- patient follows with Dr. Orlie Dakin -- patient getting chemotherapy. She finished radiation therapy  Chronic back pain PRN PO and IV pain meds --Palliative care  Josh to adjust pain meds  H/o Buerger's dz Tobacco use --stable    Family communication : Jerome/boyfriend updated at bedside Consults :GI, Oncology CODE STATUS: full DVT Prophylaxis :lovenox Level of care: Telemetry Medical Status is: Inpatient Remains inpatient appropriate because: acute colitis, still quite symptomatic    TOTAL TIME TAKING CARE OF THIS PATIENT: 35 minutes.  >50% time spent on counselling and coordination of care  Note: This dictation was prepared with Dragon dictation along with smaller phrase technology. Any transcriptional errors that result from this process are unintentional.  Delfino Lovett M.D    [facility]   CC: Primary care physician; [name], MD

## 2023-07-28 DIAGNOSIS — K5150 Left sided colitis without complications: Secondary | ICD-10-CM | POA: Diagnosis not present

## 2023-07-28 DIAGNOSIS — D61811 Other drug-induced pancytopenia: Secondary | ICD-10-CM | POA: Diagnosis not present

## 2023-07-28 DIAGNOSIS — D696 Thrombocytopenia, unspecified: Secondary | ICD-10-CM

## 2023-07-28 DIAGNOSIS — Z515 Encounter for palliative care: Secondary | ICD-10-CM | POA: Diagnosis not present

## 2023-07-28 DIAGNOSIS — K529 Noninfective gastroenteritis and colitis, unspecified: Secondary | ICD-10-CM | POA: Diagnosis not present

## 2023-07-28 DIAGNOSIS — C801 Malignant (primary) neoplasm, unspecified: Secondary | ICD-10-CM | POA: Diagnosis not present

## 2023-07-28 LAB — PREPARE PLATELET PHERESIS: Unit division: 0

## 2023-07-28 LAB — BASIC METABOLIC PANEL
Anion gap: 10 (ref 5–15)
BUN: 21 mg/dL — ABNORMAL HIGH (ref 6–20)
CO2: 22 mmol/L (ref 22–32)
Calcium: 7.7 mg/dL — ABNORMAL LOW (ref 8.9–10.3)
Chloride: 101 mmol/L (ref 98–111)
Creatinine, Ser: 0.64 mg/dL (ref 0.44–1.00)
GFR, Estimated: >60 mL/min (ref >60)
Glucose, Bld: 123 mg/dL — ABNORMAL HIGH (ref 70–99)
Potassium: 2.8 mmol/L — ABNORMAL LOW (ref 3.5–5.1)
Sodium: 133 mmol/L — ABNORMAL LOW (ref 135–145)

## 2023-07-28 LAB — BPAM PLATELET PHERESIS
Blood Product Expiration Date: 202409272359
ISSUE DATE / TIME: 202409261220
Unit Type and Rh: 6200

## 2023-07-28 LAB — ABO/RH
ABO/RH(D): O POS
ABO/RH(D): O POS

## 2023-07-28 LAB — CBC
HCT: 20.5 % — ABNORMAL LOW (ref 36.0–46.0)
Hemoglobin: 7.3 g/dL — ABNORMAL LOW (ref 12.0–15.0)
MCH: 31.6 pg (ref 26.0–34.0)
MCHC: 35.6 g/dL (ref 30.0–36.0)
MCV: 88.7 fL (ref 80.0–100.0)
Platelets: 11 10*3/uL — CL (ref 150–400)
RBC: 2.31 MIL/uL — ABNORMAL LOW (ref 3.87–5.11)
RDW: 14.5 % (ref 11.5–15.5)
WBC: 1.1 10*3/uL — CL (ref 4.0–10.5)
nRBC: 5.3 % — ABNORMAL HIGH (ref 0.0–0.2)

## 2023-07-28 LAB — SURGICAL PATHOLOGY

## 2023-07-28 LAB — MAGNESIUM: Magnesium: 2.2 mg/dL (ref 1.7–2.4)

## 2023-07-28 MED ORDER — SODIUM CHLORIDE 0.9% FLUSH
10.0000 mL | Freq: Two times a day (BID) | INTRAVENOUS | Status: DC
  Administered 2023-07-28 – 2023-08-03 (×14): 10 mL

## 2023-07-28 MED ORDER — POTASSIUM CHLORIDE 10 MEQ/100ML IV SOLN
10.0000 meq | INTRAVENOUS | Status: AC
  Administered 2023-07-28 (×2): 10 meq via INTRAVENOUS
  Filled 2023-07-28 (×2): qty 100

## 2023-07-28 MED ORDER — SODIUM CHLORIDE 0.9 % IV SOLN
5.0000 mg/kg | Freq: Two times a day (BID) | INTRAVENOUS | Status: DC
  Administered 2023-07-28 – 2023-08-02 (×11): 385 mg via INTRAVENOUS
  Filled 2023-07-28 (×11): qty 7.7

## 2023-07-28 MED ORDER — SODIUM CHLORIDE 0.9% IV SOLUTION: Freq: Once | INTRAVENOUS | Status: DC

## 2023-07-28 MED ORDER — PREDNISONE 20 MG PO TABS
50.0000 mg | ORAL_TABLET | Freq: Every day | ORAL | Status: DC
  Administered 2023-07-28: 50 mg via ORAL
  Filled 2023-07-28: qty 1

## 2023-07-28 MED ORDER — PREDNISONE 20 MG PO TABS
50.0000 mg | ORAL_TABLET | Freq: Every day | ORAL | Status: AC
  Administered 2023-07-29: 50 mg via ORAL
  Filled 2023-07-28: qty 1

## 2023-07-28 MED ORDER — POTASSIUM CHLORIDE CRYS ER 20 MEQ PO TBCR
40.0000 meq | EXTENDED_RELEASE_TABLET | Freq: Once | ORAL | Status: DC
  Filled 2023-07-28: qty 2

## 2023-07-28 MED ORDER — POTASSIUM CHLORIDE 10 MEQ/100ML IV SOLN
10.0000 meq | INTRAVENOUS | Status: DC
  Administered 2023-07-28 (×2): 10 meq via INTRAVENOUS
  Filled 2023-07-28: qty 100

## 2023-07-28 MED ORDER — POTASSIUM CHLORIDE 10 MEQ/100ML IV SOLN
10.0000 meq | INTRAVENOUS | Status: DC
  Filled 2023-07-28: qty 100

## 2023-07-28 MED ORDER — HYDROMORPHONE HCL 1 MG/ML IJ SOLN
0.5000 mg | INTRAMUSCULAR | Status: DC | PRN
  Administered 2023-07-28 – 2023-07-30 (×11): 1 mg via INTRAVENOUS
  Filled 2023-07-28 (×12): qty 1

## 2023-07-28 MED ORDER — PREDNISONE 10 MG PO TABS
10.0000 mg | ORAL_TABLET | Freq: Once | ORAL | Status: AC
  Administered 2023-07-28: 10 mg via ORAL
  Filled 2023-07-28: qty 1

## 2023-07-28 MED ORDER — POTASSIUM CHLORIDE 10 MEQ/100ML IV SOLN
10.0000 meq | INTRAVENOUS | Status: AC
  Filled 2023-07-28: qty 100

## 2023-07-28 MED ORDER — CHLORHEXIDINE GLUCONATE CLOTH 2 % EX PADS
6.0000 | MEDICATED_PAD | Freq: Every day | CUTANEOUS | Status: DC
  Administered 2023-07-28 – 2023-08-03 (×7): 6 via TOPICAL

## 2023-07-28 MED ORDER — SODIUM CHLORIDE 0.9% FLUSH: 10.0000 mL | INTRAVENOUS | Status: DC | PRN

## 2023-07-28 MED ORDER — PREDNISONE 20 MG PO TABS
20.0000 mg | ORAL_TABLET | Freq: Every day | ORAL | Status: AC
  Administered 2023-08-01: 20 mg via ORAL
  Filled 2023-07-28: qty 1

## 2023-07-28 MED ORDER — PREDNISONE 20 MG PO TABS
30.0000 mg | ORAL_TABLET | Freq: Every day | ORAL | Status: AC
  Administered 2023-07-31: 30 mg via ORAL
  Filled 2023-07-28: qty 1

## 2023-07-28 MED ORDER — PREDNISONE 20 MG PO TABS
40.0000 mg | ORAL_TABLET | Freq: Every day | ORAL | Status: AC
  Administered 2023-07-30: 40 mg via ORAL
  Filled 2023-07-28: qty 2

## 2023-07-28 MED ORDER — PREDNISONE 10 MG PO TABS
10.0000 mg | ORAL_TABLET | Freq: Every day | ORAL | Status: AC
  Administered 2023-08-02: 10 mg via ORAL
  Filled 2023-07-28: qty 1

## 2023-07-28 NOTE — Progress Notes (Signed)
[facility]  - [place] at [facility]   PATIENT NAME: Sheryl Silva    MR#:  [id number]  DATE OF BIRTH:  12-17-1975  SUBJECTIVE:  Requesting to increase her pain medication.  Also to restart her oral steroids VITALS:  Blood pressure 136/75, pulse 97, temperature 98.2 F (36.8 C), temperature source Oral, resp. rate 18, height 5' (1.524 m), weight 77.1 kg, SpO2 100%.  PHYSICAL EXAMINATION:   GENERAL:  47 y.o.-year-old patient with no acute distress.  LUNGS: Normal breath sounds bilaterally CARDIOVASCULAR: S1, S2 normal. No murmur   ABDOMEN: Soft, diffusely tender, nondistended. EXTREMITIES: No  edema b/l.    NEUROLOGIC: nonfocal  patient is alert and awake  LABORATORY PANEL:  CBC Recent Labs  Lab 07/28/23 0422  WBC 1.1*  HGB 7.3*  HCT 20.5*  PLT 11*    Chemistries  Recent Labs  Lab 07/23/23 0326 07/28/23 0422  NA 130* 133*  K 3.5 2.8*  CL 103 101  CO2 18* 22  GLUCOSE 85 123*  BUN 12 21*  CREATININE 0.57 0.64  CALCIUM 7.7* 7.7*  MG  --  2.2  AST 27  --   ALT 33  --   ALKPHOS 247*  --   BILITOT 0.9  --     RADIOLOGY:  No results found.  Assessment and Plan  Sheryl Silva is a 47 y.o. 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  Patient was diagnosed with stage IV metastatic cancer initially was with on no arranging, pathology from axillary lymph node showed possible GI source.  Patient was started on chemo and radiation therapy with mFLFOX6 q. 14 days and so far she completed 7 cycles.  She said that with each cycle of chemotherapy she has had mild diarrhea but was never severe and or self-limiting.  Last chemotherapy and radiation was 9/16-17 Monday, patient started develop severe cramping pain in the left lower quadrant for 2 days then started to have a severe  diarrhea,   CTA negative for PE, extensive evidence of metastatic cancer involving the bones, lymph nodes.  Acute colitis versus diverticulitis descending and sigmoid colon.  Left adnexal pelvic mass.  Pelvic ultrasound showed heterogeneous hypervascular left adnexal mass probably metastatic in nature.   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  Cardiomegaly and small pericardial effusion -Patient denied any shortness of breath  Stage IV metastatic signet ring adenocarcinoma -- patient follows with Dr. Orlie Dakin -- patient getting chemotherapy. She finished radiation therapy  Chronic back pain PRN PO and IV pain meds --Palliative care Josh to adjust pain meds -Morphine changed to IV hydromorphone today  H/o Buerger's dz Tobacco use --stable  Thrombocytopenia/leukopenia Will order 1 platelet transfusion per oncology recommendation.  Dr. Orlie Dakin is aware  Family communication : None at bedside Consults :  GI, Oncology, ID CODE STATUS: full DVT Prophylaxis :lovenox Level of care: Telemetry Medical Status is: Inpatient Remains inpatient appropriate because: acute ischemic/CMP colitis, still quite symptomatic   She is critically sick and high risk for cardiorespiratory failure, multiorgan failure and death  TOTAL TIME (critical care) TAKING CARE OF  THIS PATIENT: 35 minutes.  >50% time spent on counselling and coordination of care  Note: This dictation was prepared with Dragon dictation along with smaller phrase technology. Any transcriptional errors that result from this process are unintentional.  Delfino Lovett M.D    [facility]   CC: Primary care physician; [name], MD

## 2023-07-28 NOTE — Progress Notes (Signed)
PHARMACY CONSULT NOTE - FOLLOW UP  Pharmacy Consult for Electrolyte Monitoring and Replacement   Recent Labs: Potassium (mmol/L)  Date Value  07/28/2023 2.8 (L)   Magnesium (mg/dL)  Date Value  16/08/9603 2.2   Calcium (mg/dL)  Date Value  54/07/8118 7.7 (L)   Albumin (g/dL)  Date Value  14/78/2956 2.9 (L)   Sodium (mmol/L)  Date Value  07/28/2023 133 (L)    Assessment: 47 y.o. 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 Pharmacy is asked to follow and replace electrolytes  Goal of Therapy:  Electrolytes WNL  Plan:  ---10 mEq IV KCl x 4  ---recheck electrolytes in am  Lowella Bandy ,PharmD Clinical Pharmacist 07/28/2023 8:04 AM

## 2023-07-28 NOTE — Progress Notes (Signed)
CROSS COVER NOTE  NAME: Sheryl Silva MRN: [id number] DOB : May 03, 1976    Concern as stated by nurse / staff   K 2.8     Pertinent findings on chart review: reviewed  Assessment and  Interventions   Assessment:  Plan: 40 meq oral + 40 IV Mag added to am labs for today and lab notified       Donnie Mesa NP [facility] Cross Cover 7pm-7am - check amion for availability Pager [phone]

## 2023-07-28 NOTE — Progress Notes (Signed)
Palliative Medicine [facility] at [facility] [phone]) [phone] [phone]) [phone]   Name: Sheryl Silva Date: 07/28/2023 MRN: [id number]  DOB: 1975-12-31  Patient Care Team: [name], MD as PCP - General (Internal Medicine) Jim Like, RN as Registered Nurse Scarlett Presto, RN (Inactive) as Registered Nurse Benita Gutter, RN as Oncology Nurse Navigator Jeralyn Ruths, MD as Consulting Physician (Oncology)    REASON FOR CONSULTATION: Sheryl Silva is a 47 y.o. 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  Patient has been on regimen of FOLFOX plus Keytruda.  She was admitted to the hospital 07/22/2023 with abdominal pain.  CT of the abdomen and pelvis showed progressive mediastinal, retroperitoneal, and pelvic lymphadenopathy, diffuse osteoblastic metastatic disease, and left adnexal pelvic mass.  Patient also with findings of colitis.  Palliative care was consulted to address goals and manage ongoing symptoms.    CODE STATUS: Full code  PAST MEDICAL HISTORY: Past Medical History:  Diagnosis Date   Anxiety    Cancer (HCC)    Depression    Heart murmur    Hypertension     PAST SURGICAL HISTORY:  Past Surgical History:  Procedure Laterality Date   BIOPSY  07/26/2023   Procedure: BIOPSY;  Surgeon: Toney Reil, MD;  Location: [facility] ENDOSCOPY;  Service: Gastroenterology;;   CESAREAN SECTION     COLONOSCOPY WITH PROPOFOL N/A 02/21/2023   Procedure: COLONOSCOPY WITH PROPOFOL;  Surgeon: Regis Bill, MD;  Location: [facility] ENDOSCOPY;  Service: Endoscopy;  Laterality: N/A;   DILATION AND CURETTAGE OF UTERUS     FLEXIBLE SIGMOIDOSCOPY N/A 07/26/2023   Procedure: FLEXIBLE SIGMOIDOSCOPY;  Surgeon: Toney Reil, MD;  Location: [facility] ENDOSCOPY;  Service: Gastroenterology;  Laterality: N/A;   IR IMAGING GUIDED PORT INSERTION  02/16/2023     HEMATOLOGY/ONCOLOGY HISTORY:  Oncology History  Signet ring cell adenocarcinoma (HCC)  02/07/2023 Cancer Staging   Staging form: Exocrine Pancreas, AJCC 8th Edition - Clinical stage from 02/07/2023: Stage IV (cTX, cNX, pM1) - Signed by Jeralyn Ruths, MD on 02/14/2023 Stage prefix: Initial diagnosis   02/14/2023 Initial Diagnosis   Signet ring cell adenocarcinoma   03/02/2023 -  Chemotherapy   Patient is on Treatment Plan : GI ORIGIN FOLFOX+KEYTRUDA q21d x12 followed by Gwenlyn Fudge       ALLERGIES:  is allergic to nifedipine.  MEDICATIONS:  Current Facility-Administered Medications  Medication Dose Route Frequency Provider Last Rate Last Admin   0.9 %  sodium chloride infusion (Manually program via Guardrails IV Fluids)   Intravenous Once Delfino Lovett, MD       0.9 %  sodium chloride infusion   Intravenous PRN Enedina Finner, MD   Stopped at 07/25/23 1757   acetaminophen (TYLENOL) tablet 650 mg  650 mg Oral Q6H PRN Mikey College T, MD   650 mg at 07/28/23 0019   acidophilus (RISAQUAD) capsule 2 capsule  2 capsule Oral Daily Enedina Finner, MD   2 capsule at 07/26/23 0854   albuterol (PROVENTIL) (2.5 MG/3ML) 0.083% nebulizer solution 2.5 mg  2.5 mg Nebulization Q4H PRN Manuela Schwartz, NP   2.5 mg at 07/24/23 2001   busPIRone (BUSPAR) tablet 5 mg  5 mg Oral BID Enedina Finner, MD   5 mg at 07/27/23 2111   Chlorhexidine Gluconate Cloth 2 % PADS 6 each  6 each Topical Daily Jeralyn Ruths, MD       cyanocobalamin (VITAMIN B12) tablet 500  mcg  500 mcg Oral Daily Enedina Finner, MD   500 mcg at 07/26/23 0855   dicyclomine (BENTYL) tablet 20 mg  20 mg Oral TID PRN Toney Reil, MD   20 mg at 07/27/23 1326   DULoxetine (CYMBALTA) DR capsule 60 mg  60 mg Oral Daily Enedina Finner, MD   60 mg at 07/26/23 0856   gabapentin (NEURONTIN) capsule 300 mg  300 mg Oral TID Mikey College T, MD   300 mg at 07/26/23 1811   hydrocortisone (CORTENEMA) enema 100 mg  100 mg Rectal QHS Toney Reil, MD   100 mg at 07/27/23 2114   HYDROmorphone (DILAUDID) injection 0.5-1 mg  0.5-1 mg Intravenous Q4H PRN Esker Dever, Daryl Eastern, NP   1 mg at 07/28/23 1129   lactobacillus (FLORANEX/LACTINEX) granules 1 g  1 g Oral TID WC Mikey College T, MD   1 g at 07/27/23 1727   lactose free nutrition (BOOST PLUS) liquid 237 mL  237 mL Oral TID WC Enedina Finner, MD   237 mL at 07/27/23 1727   loperamide (IMODIUM) capsule 2 mg  2 mg Oral Q6H PRN Delfino Lovett, MD   2 mg at 07/27/23 1727   megestrol (MEGACE) tablet 40 mg  40 mg Oral Daily Mikey College T, MD   40 mg at 07/26/23 0981   multivitamin with minerals tablet 1 tablet  1 tablet Oral Daily Enedina Finner, MD   1 tablet at 07/26/23 0855   ondansetron (ZOFRAN) injection 4 mg  4 mg Intravenous Q6H PRN Mikey College T, MD   4 mg at 07/28/23 0944   oxyCODONE-acetaminophen (PERCOCET/ROXICET) 5-325 MG per tablet 1-2 tablet  1-2 tablet Oral Q4H PRN Enedina Finner, MD   1 tablet at 07/28/23 1914   And   oxyCODONE (Oxy IR/ROXICODONE) immediate release tablet 5 mg  5 mg Oral Q4H PRN Enedina Finner, MD   5 mg at 07/28/23 0429   pantoprazole (PROTONIX) EC tablet 40 mg  40 mg Oral Daily Mikey College T, MD   40 mg at 07/26/23 0855   potassium chloride 10 mEq in 100 mL IVPB  10 mEq Intravenous Q1 Hr x 4 Lowella Bandy, RPH       potassium chloride SA (KLOR-CON M) CR tablet 40 mEq  40 mEq Oral Once Jawo, Modou L, NP       predniSONE (DELTASONE) tablet 10 mg  10 mg Oral Once Lowella Bandy, [facility]       [START ON 07/29/2023] predniSONE (DELTASONE) tablet 50 mg  50 mg Oral Q breakfast Delfino Lovett, MD       Followed by   Melene Muller ON 07/30/2023] predniSONE (DELTASONE) tablet 40 mg  40 mg Oral Q breakfast Delfino Lovett, MD       Followed by   Melene Muller ON 07/31/2023] predniSONE (DELTASONE) tablet 30 mg  30 mg Oral Q breakfast Delfino Lovett, MD       Followed by   Melene Muller ON 08/01/2023] predniSONE (DELTASONE) tablet 20 mg  20 mg Oral Q breakfast Delfino Lovett, MD       Followed by   Melene Muller ON 08/02/2023]  predniSONE (DELTASONE) tablet 10 mg  10 mg Oral Q breakfast Sherryll Burger, Vipul, MD       sodium chloride flush (NS) 0.9 % injection 10-40 mL  10-40 mL Intracatheter Q12H Finnegan, Tollie Pizza, MD       sodium chloride flush (NS) 0.9 % injection 10-40 mL  10-40 mL Intracatheter PRN Gerarda Fraction  J, MD       traMADol Janean Sark) tablet 50 mg  50 mg Oral Q12H PRN Enedina Finner, MD   50 mg at 07/25/23 0250   traZODone (DESYREL) tablet 25 mg  25 mg Oral QHS PRN Mansy, Jan A, MD   25 mg at 07/26/23 2209   Vitamin D (Ergocalciferol) (DRISDOL) 1.25 MG (50000 UNIT) capsule 50,000 Units  50,000 Units Oral Q7 days Enedina Finner, MD   50,000 Units at 07/24/23 2034   Facility-Administered Medications Ordered in Other Encounters  Medication Dose Route Frequency Provider Last Rate Last Admin   sodium chloride flush (NS) 0.9 % injection 10 mL  10 mL Intracatheter PRN Jeralyn Ruths, MD   10 mL at 06/29/23 1430    VITAL SIGNS: BP (!) 152/92   Pulse 89   Temp 98.2 F (36.8 C) (Oral)   Resp 18   Ht 5' (1.524 m)   Wt 170 lb (77.1 kg)   LMP  (LMP Unknown)   SpO2 100%   BMI 33.20 kg/m  Filed Weights   07/26/23 1547  Weight: 170 lb (77.1 kg)    Estimated body mass index is 33.2 kg/m as calculated from the following:   Height as of this encounter: 5' (1.524 m).   Weight as of this encounter: 170 lb (77.1 kg).  LABS: CBC:    Component Value Date/Time   WBC 1.1 (LL) 07/28/2023 0422   HGB 7.3 (L) 07/28/2023 0422   HGB 9.4 (L) 07/19/2023 1432   HCT 20.5 (L) 07/28/2023 0422   PLT 11 (LL) 07/28/2023 0422   PLT 97 (L) 07/19/2023 1432   MCV 88.7 07/28/2023 0422   NEUTROABS 3.9 07/22/2023 0225   LYMPHSABS 0.4 (L) 07/22/2023 0225   MONOABS 0.1 07/22/2023 0225   EOSABS 0.1 07/22/2023 0225   BASOSABS 0.0 07/22/2023 0225   Comprehensive Metabolic Panel:    Component Value Date/Time   NA 133 (L) 07/28/2023 0422   K 2.8 (L) 07/28/2023 0422   CL 101 07/28/2023 0422   CO2 22 07/28/2023 0422   BUN 21 (H)  07/28/2023 0422   CREATININE 0.64 07/28/2023 0422   CREATININE 0.65 07/18/2023 0829   GLUCOSE 123 (H) 07/28/2023 0422   CALCIUM 7.7 (L) 07/28/2023 0422   AST 27 07/23/2023 0326   AST 106 (H) 07/18/2023 0829   ALT 33 07/23/2023 0326   ALT 113 (H) 07/18/2023 0829   ALKPHOS 247 (H) 07/23/2023 0326   BILITOT 0.9 07/23/2023 0326   BILITOT 0.5 07/18/2023 0829   PROT 5.9 (L) 07/23/2023 0326   ALBUMIN 2.9 (L) 07/23/2023 0326    RADIOGRAPHIC STUDIES: US PELVIC COMPLETE W TRANSVAGINAL AND TORSION R/O  Result Date: 07/22/2023 CLINICAL DATA:  [id number] with pelvic pain for 5 days. History of metastatic adrenal carcinoma and steadily enlarging left adnexal/pelvic mass on CT with a normal left ovary not able to be distinguished from it. There was also evidence of distal descending and proximal to mid sigmoid colitis/diverticulitis on CT today. EXAM: TRANSABDOMINAL AND TRANSVAGINAL ULTRASOUND OF PELVIS DOPPLER ULTRASOUND OF OVARIES TECHNIQUE: Both transabdominal and transvaginal ultrasound examinations of the pelvis were performed. Transabdominal technique was performed for global imaging of the pelvis including uterus, ovaries, adnexal regions, and pelvic cul-de-sac. It was necessary to proceed with endovaginal exam following the transabdominal exam to visualize the uterus, right ovary and endometrium better. Color and duplex Doppler ultrasound was utilized to evaluate blood flow to the ovaries. COMPARISON:  CT with IV contrast today, PET-CT 07/06/2023, and  earlier CT studies from this year back to 12/18/2022. FINDINGS: Uterus Measurements: Anteverted and heterogeneous measuring 7.2 x 3.8 x 4.3 cm = volume: 61.2 mL. No fibroids or other mass visualized. Incidentally noted is a 2 cm simple nabothian cysts in the cervix. Despite myometrial heterogeneity there is no appreciable focal wall mass. Endometrium Thickness: 5.7 mm.  No focal abnormality visualized. Right ovary Measurements: 3.0 x 1.4 x 2.0 = volume: 4.3 mL.  Normal appearance/no adnexal mass. Left ovary There is a heterogeneous hypervascular left adnexal mass which is inseparable and indistinguishable from the left ovary. On CT this measured 5.4 x 4.8 cm. Ultrasound is 5.5 x 4.8 x 5.2 cm and 72 mL. 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 Pulsed Doppler evaluation of both ovaries demonstrates normal low-resistance arterial and venous waveforms. Other findings Mild anechoic free fluid, which was also seen on CT. IMPRESSION: Heterogeneous hypervascular left adnexal mass which is inseparable from the left ovary. 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 Right ovary, uterus and endometrium are essentially unremarkable with incidental 2 cm simple nabothian cervical cyst. Mildly heterogeneous myometrium. Mild free fluid, nonspecific. Electronically Signed   By: Almira Bar M.D.   On: 07/22/2023 06:01   CT Angio Chest PE W and/or Wo Contrast  Result Date: 07/22/2023 CLINICAL DATA:  Weakness, emesis and diarrhea for the last several days. Currently taking chemotherapy and radiation for metastatic adrenal carcinoma. EXAM: CT ANGIOGRAPHY CHEST CT ABDOMEN AND PELVIS WITH CONTRAST TECHNIQUE: Multidetector CT imaging of the chest was performed using the standard protocol during bolus administration of intravenous contrast. Multiplanar CT image reconstructions and MIPs were obtained to evaluate the vascular anatomy. Multidetector CT imaging of the abdomen and pelvis was performed using the standard protocol during bolus administration of intravenous contrast. RADIATION DOSE REDUCTION: This exam was performed  according to the departmental dose-optimization program which includes automated exposure control, adjustment of the mA and/or kV according to patient size and/or use of iterative reconstruction technique. CONTRAST:  OMNIPAQUE IOHEXOL 350 MG/ML SOLN COMPARISON:  Portable chest today, PET-CT 07/06/2023, chest, abdomen and pelvis outside CT images only dated 02/03/2023. FINDINGS: CTA CHEST FINDINGS Cardiovascular: Pulmonary arteries are normal in caliber and do not show embolic filling defects. There is minimal atherosclerosis in the aortic arch. There is no aneurysm, stenosis or dissection. The great vessels are clear.  The pulmonary veins are nondistended. There is mild cardiomegaly with a left chamber predominance. There is a right chest port with IJ approach catheter with tip in the right atrium. There is a small pericardial effusion new from 07/06/2023. No visible coronary calcific plaques. Mediastinum/Nodes: Lower paratracheal space node measuring 8 mm in short axis on 4:45 and adjacent right-sided precarinal lymph node also 8 mm short axis. Subcarinal lymph node up to 9 mm short axis. All of these with uptake on PET-CT but no bulky adenopathy. No hilar adenopathy. In the left axilla multiple enlarged lymph nodes again are noted, with multifocal PET activity. Largest of these is 1.4 x 3.3 cm on 4:17, 2 x 1.5 cm on 4:20 and 2 x 1.5 cm on 4:31. There has been no change since 07/06/2023. There is an enlarged right  axillary node measuring 1.3 x 1.8 cm on 4:34. Right cardiophrenic angle lymph nodes largest of these is 9 mm in short axis on 4:95. Lungs/Pleura: Mildly elevated right hemidiaphragm. Unchanged 6 mm pleural-based posterior left upper lobe nodule on 5:36. Unchanged 5 mm pleural-based nodule anteriorly in the left upper lobe on 5:49. There are no other appreciable nodules. There is no consolidation, effusion or pneumothorax. 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  There are scattered thoracic spine endplate Schmorl's nodes but no acute compression fracture. Findings are more confluent and widespread than on 02/03/2023. Review of the MIP images confirms the above findings. CT ABDOMEN and PELVIS FINDINGS Hepatobiliary: No hepatic mass enhancement. Mild hepatic steatosis. Cholelithiasis without evidence of cholecystitis or biliary dilatation. Pancreas: No abnormality. Spleen: No abnormality. 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 Both kidneys enhance homogeneously. There is no urinary stone or obstruction. No bladder thickening. Stomach/Bowel: There is fluid in the colon in the ascending and transverse segments. There are increasing thickened folds in the distal descending and proximal/mid sigmoid colon and adjacent stranding consistent with colitis or diverticulitis. There is no wall pneumatosis, free air, or abscess. Vascular/Lymphatic: Small hypermetabolic lymph nodes were noted on the recent PET-CT and have not changed. Examples include an aortocaval lymph node 7 mm short axis on 11: 39, left external iliac chain node is 9 mm short axis on 11:77, with scattered subcentimeter hypermetabolic inguinal lymph nodes also recently seen. By strict size criteria, majority of the hypermetabolic nodes are not frankly enlarged. There are slightly prominent left periaortic chain nodes up to 1 cm in short axis. Large heterogeneous left adnexal/pelvic mass again measures 5.4 x 4.8 cm on 11:72. This is about double the size it was on an earlier study this year on 12/18/2022. Reproductive: The uterus is intact. No new adnexal abnormality. 2 cm transmural fibroid of the right side of the fundus. Other: Small volume of pelvic ascites. No free hemorrhage, free air or localizing collections. 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 No visible pathologic regional fracture. Review of the MIP  images confirms the above findings. IMPRESSION: 1. No evidence of arterial embolus or acute chest process. 2. Small pericardial effusion, new from 07/06/2023. 3. Cardiomegaly without evidence of CHF. 4. Unchanged 6 mm and 5 mm left upper lobe nodules. 5. Bilateral axillary adenopathy, left greater than right, with borderline to slightly prominent mediastinal, retroperitoneal and pelvic lymph nodes. Positive PET activity on recent PET-CT. 6. 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 7. Increasing thickened folds in the distal descending and proximal/mid sigmoid colon with adjacent stranding consistent with colitis or diverticulitis. No wall pneumatosis, free air, or abscess. 8. 5.4 x 4.8 cm heterogeneous left adnexal/pelvic mass almost certainly metastatic, about double the size it was on an earlier study this year on 12/18/2022. 9. Small volume of pelvic ascites.  No free air. 10. Cholelithiasis without evidence of cholecystitis. 11. Mild hepatic steatosis. Electronically Signed   By: Almira Bar M.D.   On: 07/22/2023 04:40   CT ABDOMEN PELVIS W CONTRAST  Result Date: 07/22/2023 CLINICAL DATA:  Weakness, emesis and diarrhea for the last several days. Currently taking chemotherapy and radiation for metastatic adrenal carcinoma. EXAM: CT ANGIOGRAPHY CHEST CT ABDOMEN AND PELVIS WITH CONTRAST TECHNIQUE: Multidetector CT imaging of the chest was performed using the standard protocol during bolus administration of intravenous contrast. Multiplanar CT image reconstructions and MIPs were obtained to evaluate the vascular anatomy.  Multidetector CT imaging of the abdomen and pelvis was performed using the standard protocol during bolus administration of intravenous contrast. RADIATION DOSE REDUCTION: This exam was performed according to the departmental dose-optimization program which includes automated exposure control, adjustment of the mA and/or kV according to patient size and/or use of  iterative reconstruction technique. CONTRAST:  OMNIPAQUE IOHEXOL 350 MG/ML SOLN COMPARISON:  Portable chest today, PET-CT 07/06/2023, chest, abdomen and pelvis outside CT images only dated 02/03/2023. FINDINGS: CTA CHEST FINDINGS Cardiovascular: Pulmonary arteries are normal in caliber and do not show embolic filling defects. There is minimal atherosclerosis in the aortic arch. There is no aneurysm, stenosis or dissection. The great vessels are clear.  The pulmonary veins are nondistended. There is mild cardiomegaly with a left chamber predominance. There is a right chest port with IJ approach catheter with tip in the right atrium. There is a small pericardial effusion new from 07/06/2023. No visible coronary calcific plaques. Mediastinum/Nodes: Lower paratracheal space node measuring 8 mm in short axis on 4:45 and adjacent right-sided precarinal lymph node also 8 mm short axis. Subcarinal lymph node up to 9 mm short axis. All of these with uptake on PET-CT but no bulky adenopathy. No hilar adenopathy. In the left axilla multiple enlarged lymph nodes again are noted, with multifocal PET activity. Largest of these is 1.4 x 3.3 cm on 4:17, 2 x 1.5 cm on 4:20 and 2 x 1.5 cm on 4:31. There has been no change since 07/06/2023. There is an enlarged right axillary node measuring 1.3 x 1.8 cm on 4:34. Right cardiophrenic angle lymph nodes largest of these is 9 mm in short axis on 4:95. Lungs/Pleura: Mildly elevated right hemidiaphragm. Unchanged 6 mm pleural-based posterior left upper lobe nodule on 5:36. Unchanged 5 mm pleural-based nodule anteriorly in the left upper lobe on 5:49. There are no other appreciable nodules. There is no consolidation, effusion or pneumothorax. 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 There are scattered thoracic spine endplate Schmorl's nodes but no acute compression fracture. Findings are more confluent and widespread than on 02/03/2023. Review of the  MIP images confirms the above findings. CT ABDOMEN and PELVIS FINDINGS Hepatobiliary: No hepatic mass enhancement. Mild hepatic steatosis. Cholelithiasis without evidence of cholecystitis or biliary dilatation. Pancreas: No abnormality. Spleen: No abnormality. 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 Both kidneys enhance homogeneously. There is no urinary stone or obstruction. No bladder thickening. Stomach/Bowel: There is fluid in the colon in the ascending and transverse segments. There are increasing thickened folds in the distal descending and proximal/mid sigmoid colon and adjacent stranding consistent with colitis or diverticulitis. There is no wall pneumatosis, free air, or abscess. Vascular/Lymphatic: Small hypermetabolic lymph nodes were noted on the recent PET-CT and have not changed. Examples include an aortocaval lymph node 7 mm short axis on 11: 39, left external iliac chain node is 9 mm short axis on 11:77, with scattered subcentimeter hypermetabolic inguinal lymph nodes also recently seen. By strict size criteria, majority of the hypermetabolic nodes are not frankly enlarged. There are slightly prominent left periaortic chain nodes up to 1 cm in short axis. Large heterogeneous left adnexal/pelvic mass again measures 5.4 x 4.8 cm on 11:72. This is about double the size it was on an earlier study this year on 12/18/2022. Reproductive: The uterus is intact. No new adnexal abnormality. 2 cm transmural fibroid of the right side of the fundus. Other: Small volume of pelvic ascites. No free  hemorrhage, free air or localizing collections. 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 No visible pathologic regional fracture. Review of the MIP images confirms the above findings. IMPRESSION: 1. No evidence of arterial embolus or acute chest process. 2. Small pericardial effusion, new from 07/06/2023. 3.  Cardiomegaly without evidence of CHF. 4. Unchanged 6 mm and 5 mm left upper lobe nodules. 5. Bilateral axillary adenopathy, left greater than right, with borderline to slightly prominent mediastinal, retroperitoneal and pelvic lymph nodes. Positive PET activity on recent PET-CT. 6. 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 7. Increasing thickened folds in the distal descending and proximal/mid sigmoid colon with adjacent stranding consistent with colitis or diverticulitis. No wall pneumatosis, free air, or abscess. 8. 5.4 x 4.8 cm heterogeneous left adnexal/pelvic mass almost certainly metastatic, about double the size it was on an earlier study this year on 12/18/2022. 9. Small volume of pelvic ascites.  No free air. 10. Cholelithiasis without evidence of cholecystitis. 11. Mild hepatic steatosis. Electronically Signed   By: Almira Bar M.D.   On: 07/22/2023 04:40   DG Chest Port 1 View  Result Date: 07/22/2023 CLINICAL DATA:  Generalized weakness for several days, history of adrenal carcinoma EXAM: PORTABLE CHEST 1 VIEW COMPARISON:  PET-CT from 07/06/2019 FINDINGS: Cardiac shadow is within normal limits. Lungs are well aerated bilaterally. Right chest wall port is noted. Bony structures demonstrate increased sclerosis consistent with metastatic disease. IMPRESSION: Changes consistent with bony metastatic disease. These are stable from prior PET-CT. No acute abnormality noted. Electronically Signed   By: Alcide Clever M.D.   On: 07/22/2023 02:50   NM PET Image Restag (PS) Skull Base To Thigh  Result Date: 07/15/2023 CLINICAL DATA:  Subsequent treatment strategy for adrenal carcinoma. EXAM: NUCLEAR MEDICINE PET SKULL BASE TO THIGH TECHNIQUE: 9.6 mCi F-18 FDG was injected intravenously. Full-ring PET imaging was performed from the skull base to thigh after the radiotracer. CT data was obtained and used for attenuation correction and anatomic localization. Fasting blood glucose: 93  mg/dl COMPARISON:  16/08/9603. FINDINGS: Mediastinal blood pool activity: SUV max 2.3 Liver activity: SUV max NA NECK: Worsening cervical hypermetabolic adenopathy. Index posterior left level II/III lymph node measures 8 mm (7/25), SUV max 6.1, previously 4 mm and 2.2. Incidental CT findings: None. CHEST: Hypermetabolic mediastinal and axillary lymph nodes, progressive. Index left axillary lymph node measures 13 mm (7/47), SUV max 7.9, compared to 10 mm and 6.8. Incidental CT findings: Right IJ Port-A-Cath terminates in the right atrium. Heart is enlarged. No pericardial or pleural effusion. ABDOMEN/PELVIS: Similar hypermetabolic abdominal retroperitoneal lymph nodes. Index aortocaval lymph node measures 8 mm (7/96), SUV max 4.5. Progressive pelvic retroperitoneal lymph nodes. Index left external iliac lymph node measures 12 mm (7/134), SUV max 8.2, previously 10 mm and 2.4. Additional small hypermetabolic inguinal lymph nodes. Enlarging left adnexal mass now measures 4.5 x 5.5 cm (7/128), SUV max similar at 3.3, previously 4.4 x 4.7 cm. Incidental CT findings: Liver is grossly unremarkable. Gallstones. Adrenal glands, kidneys, spleen, pancreas, stomach and bowel are grossly unremarkable. SKELETON: Mottled sclerosis and hypermetabolism throughout the visualized osseous structures, similar to the prior exam. Index hypermetabolism in the proximal right femur, SUV max 8.3, compared to 9.3. Incidental CT findings: None. IMPRESSION: 1. Slight interval progression in metastatic disease as evidenced by enlarging and/or increasing hypermetabolic lymph nodes in the neck, chest and pelvis. Abdominal retroperitoneal lymph nodes and osseous metastatic disease appear similar. 2. Enlarging and mildly hypermetabolic left adnexal mass is worrisome  for a metastasis. Previous ultrasound 02/24/2023. 3. Cholelithiasis. Electronically Signed   By: Leanna Battles M.D.   On: 07/15/2023 13:06    PERFORMANCE STATUS (ECOG) : 3 -  Symptomatic, >50% confined to bed  Review of Systems Unless otherwise noted, a complete review of systems is negative.  Physical Exam General: NAD Cardiovascular: regular rate and rhythm Pulmonary: clear ant fields Abdomen: soft, nontender, + bowel sounds GU: no suprapubic tenderness Extremities: no edema, no joint deformities Skin: no rashes Neurological: Weakness but otherwise nonfocal  IMPRESSION: Follow-up visit.  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  ID consult pending.  Yesterday, patient's symptoms overall were improved.  Today, pain is poorly controlled.  Steroids have been restarted with plan for taper.  Rotated from IV morphine to IV hydromorphone.  Patient received dose of IV hydromorphone earlier today with some improvement in pain.  Plan for continued supportive care.  PLAN: -Continue current scope of treatment -Rotate from IV morphine to IV hydromorphone -Plan for eventual return to home opioid regimen -Will follow  Case and plan discussed with Dr. Orlie Dakin   Time Total: 20 minutes  Visit consisted of counseling and education dealing with the complex and emotionally intense issues of symptom management and palliative care in the setting of serious and potentially life-threatening illness.Greater than 50%  of this time was spent counseling and coordinating care related to the above assessment and plan.  Signed by: Laurette Schimke, PhD, NP-C

## 2023-07-28 NOTE — Progress Notes (Signed)
MD notified: lab reported a critical lab on this patient WBC is 1.1, platelets 11 and potassium is 2.8 this morning

## 2023-07-28 NOTE — Plan of Care (Signed)

## 2023-07-28 NOTE — Discharge Instructions (Addendum)
Food Resources  Agency Name: [facility] Agency Address: [address] [place], [place] [address] Phone: [phone] Website: [email or web address] Service(s) Offered: Housing services, self-sufficiency, congregate meal program, weatherization program, Event organiser program, emergency food assistance,  housing counseling, home ownership program, wheels - to work program.  [employer] free for 60 and older at various locations from [employer], Monday-Friday:  [employer], [address] [place], [phone] -[facility], [address], [place] [phone]  -[facility], [address] [place] [phone]  -[address] [place], [phone]  Agency Name: [facility] Address: [phone] [address], [place], [place] [address] Phone: [phone] Website: [email or web address] Service(s) Offered: Home delivered hot, frozen, and emergency  meals. Grocery assistance program which matches  volunteers one-on-one with seniors unable to grocery shop  for themselves. Must be 60 years and older; less than 20  hours of in-home aide service, limited or no driving ability;  live alone or with someone with a disability; live in  [place].  Agency Name: [facility] [employer]) Address: [address], [place], [place] [address] Phone: [phone] Service(s) Offered: Food is served to shut-ins, homeless, elderly, and low income people in the community every Saturday (11:30 am-12:30 pm) and Sunday (12:30 pm-1:30pm). Volunteers also offer help and encouragement in seeking employment,  and spiritual guidance.  Agency Name: [facility] [employer] Address: [address] [place], [place] [address] Phone: [phone] Service(s) Offered: Child support services; child welfare services; food stamps; Medicaid; work first family assistance; and aid  with fuel,  rent, food and medicine.  Agency Name: [employer]itian Address: [address], [place], [place] Phone: [phone] Website: [email or web address] Services Offered: Monday 10:00am-12:00, 8:00pm-9:00pm, and Friday 10:00am-12:00.  Agency Name: [employer] [facility] Address: [address] [place], [place] [address] Phone: [phone] Website: [email or web address] Service(s) Offered: Serves weekday meals, open from 11:30 am- 1:00 pm., and 6:30-7:30pm, Monday-Wednesday-Friday distributes food 3:30-6pm, Monday-Wednesday-Friday.  Agency Name: [facility] Address: [address] [place], [place] Phone: [phone] Website: [email or web address] Services Offered: Distributes food the 4th Saturday of the month, starting at 8:00 am  Agency Name: [facility] Address: [phone] [address] [place], [place] [address] Phone: [phone] Website: [email or web address] Service(s) Offered: Bread of life, weekly food pantry. Open Wednesdays from 10:00am-noon.  Agency Name: [facility] [employer] Address: [address] [place], [place], [place] Phone: [phone] Services Offered: Distributes food 9am-1pm, Monday-Thursday. Call for details.  Agency Name: [facility] Address: [address] [place], [place] [address] Phone: [phone] Website: [email or web address] Service(s) Offered: Games developer. Call for assistance.  Agency Name: [facility] Address: [address], [place], [place] [address] Phone: [phone] Service Offered: Emergency Food Pantry. Call for appointment.  Agency Name: [facility] Address: [address] [place], [place] [address] Phone: [phone] Website: [email or web address] Services Offered: Games developer. Call for details  Agency Name: [facility] Address: [address] [place], [place] Phone:  [phone] Website: [email or web address] Service(s) Offered: Emergency Food Pantry. Call for details.  Agency Name: Holiday representative Address: [address], [place], [place] [address] Phone: [phone] or [phone] Website: [email or web address] Service(s) Offered: Distribute food 9am-11:30 am, Tuesday-Friday, and 1-3:30pm, Monday-Friday. Food pantry Monday-Friday 1pm-3pm, fresh items, Mon.-Wed.-Fri.  Agency Name: [facility] Address: [address] [place], [place] [address] Phone: [phone] Website: [email or web address] Services Offered: Distribute food Tues and Sats from 9:00am-noon.  Closed 1st Saturday of each month. Call for details    Transportation Resources  Agency Name: [facility] Agency Address: [address] [place], [place] [address] Phone: [phone] Email: [email or web address] Website: [email or web address] Service(s) Offered: Housing services, self-sufficiency, congregate meal program, weatherization program, Field seismologist program, emergency food assistance,  housing counseling, home ownership program, wheels-towork program.  Agency Name: [facility] [employer] [phone]) Address: [address], [place], [place] [address] Phone: [phone] Website: [email or web address] Service(s) Offered: Transportation for [employer], subscription and demand response; Dial-a-Ride for citizens 60 years of age or older.  Agency Name: Department of Social Services Address: [address] [place], [place] [address] Phone: [phone] Service(s) Offered: Child support services; child welfare services; food stamps; Medicaid; work first family assistance; and aid with fuel,  rent, food and medicine, transportation assistance.  Agency Name: Disabled [employer] (DAV) Transportation  Network Phone: [phone] Service(s) Offered: Transports  veterans to the [facility] medical center. Call  forty-eight hours in advance and leave the name, telephone  number, date, and time of appointment. Veteran will be  contacted by the driver the day before the appointment to  arrange a pick up point   Transportation Resources  Agency Name: [facility] Address: [address] [place], [place] [address] Phone: [phone] Email: [email or web address] Website: [email or web address] Service(s) Offered: Housing services, self-sufficiency, congregate meal program, weatherization program, Field seismologist program, emergency food assistance,  housing counseling, home ownership program, wheels-towork program.  Agency Name: [facility] [employer] [phone]) Address: [address] [place], [place] [address] Phone: [phone] Website: [email or web address] Service(s) Offered: Transportation for [employer], subscription and demand response; Dial-a-Ride for citizens 5 years of age or older.  Agency Name: Department of Social Services Address: [address] [place], [place] [address] Phone: [phone] Service(s) Offered: Child support services; child welfare services; food stamps; Medicaid; work first family assistance; and aid with fuel,  rent, food and medicine, transportation assistance.  Agency Name: Disabled [employer] (DAV) Transportation  Network Phone: [phone] Service(s) Offered: Transports veterans to the [facility] medical center. Call  forty-eight hours in advance and leave the name, telephone  number, date, and time of appointment. Veteran will be  contacted by the driver the day before the appointment to  arrange a pick up point    [employer]A currently provides door to door services. ACTA connects with PART daily for services to [facility]. ACTA also performs contract services to [employer]  operates 27 vehicles, all but 3 mini-vans are equipped with lifts for special needs as well as the general public. ACTA drivers are each CDL certified and trained in First Aid and CPR. ACT[employer] was established in 2002 by [employer]. An independent Industrial/product designer. ACTA operates via Cytogeneticist with required Research scientist (physical sciences) from [place]. ACT[employer] provides over 80,000 passenger trips each year, including [facility] and [employer] sites.  Call at least by 11 AM one business day prior to needing transportation  [employer].                      [place], [place] [address]     Office Hours: Monday-Friday  8 AM - 5 PM  Pt to f/u with her pain clinic in [place] on her appt

## 2023-07-28 NOTE — TOC Progression Note (Signed)
Transition of Care [facility]) - Progression Note    Patient Details  Name: Sheryl Silva MRN: [id number] Date of Birth: Mar 24, 1976  [facility] of [facility]) CM/SW Contact  Chapman Fitch, RN Phone Number: 07/28/2023, 8:53 AM  Clinical Narrative:        Food and transportation resources added to AVS    Expected Discharge Plan and Services                                               Social Determinants of Health (SDOH) Interventions SDOH Screenings   Food Insecurity: Food Insecurity Present (07/22/2023)  Housing: Low Risk  (07/22/2023)  Transportation Needs: Unmet Transportation Needs (07/22/2023)  Utilities: Not At Risk (07/22/2023)  Alcohol Screen: Low Risk  (02/15/2023)  Depression (PHQ2-9): Low Risk  (02/14/2023)  Financial Resource Strain: Low Risk  (05/09/2023)   [facility] from [facility], [facility]  Recent Concern: Financial Resource Strain - Medium Risk (02/15/2023)  Physical Activity: Inactive (02/15/2023)  Social Connections: Socially Isolated (02/15/2023)  Stress: Stress Concern Present (02/15/2023)  Tobacco Use: Medium Risk (07/26/2023)    Readmission Risk Interventions    07/28/2023    8:49 AM  Readmission Risk Prevention Plan  Transportation Screening Complete  HRI or Home Care Consult --  Social Work Consult for Recovery Care Planning/Counseling Complete  Medication Review Oceanographer) Complete

## 2023-07-28 NOTE — Consult Note (Addendum)
NAME: Sheryl Silva  DOB: [birth year]  MRN: [id number]  Date/Time: 07/28/2023 1:57 PM Dr.Shah Subjective:  REASON FOR CONSULT: CMV colitis ? Sheryl Silva is a 47 y.o. 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 Weakness  and lower abdominal pain since the last cycleof chemo which she received on 9/16/20She ad > 10 episodes of loose stool. No blood in the stool. Vitals in the ED BP 124/87, temperature 98.6, pulse 115, respiratory rate 18 and sats 100% Labs revealed WBC of 5.8, Hb 10.4, platelet 85 and creatinine of 0.54. CT chest abdomen and pelvis revealed bilateral axillary adenopathy left greater than right, prominent mediastinal intraperitoneal and pelvic lymph nodes, diffuse osteoblastic metastatic disease, increased thickened folds all in the distal ascending and proximal and mid sigmoid colon with adjacent stranding consistent with colitis or diverticulitis.  There was also a 5.4 into 4.8 cm heterogeneous left adnexal pelvic mass seen about double the size.  From previous study Cdiff was negative GI panel PCR was negative Initially started on antibiotics Seen by GI and concern for colitis due to Check point inhibitor.. She underwent sigmoidoscopy on 07/26/23 Patient she underwent flexible sigmoidoscopy on 07/26/2023 and that showed diffuse mild inflammation in the rectosigmoid colon, in the sigmoid colon and at 30 cm proximal to the anus secondary to left-sided colitis.  This was biopsied.  She was started on tapering dose of prednisone on 07/26/23 The pathology came back as cytomegalovirus inclusion body and I am asked to see the patient for the same Patient says her diarrhea has much improved now since she has been on steroids No fever but has had chills Appetite poor No cough or shortness of breath     Past Medical History:   Diagnosis Date   Anxiety    Cancer (HCC)    Depression    Heart murmur    Hypertension     Past Surgical History:  Procedure Laterality Date   BIOPSY  07/26/2023   Procedure: BIOPSY;  Surgeon: Toney Reil, MD;  Location: [facility] ENDOSCOPY;  Service: Gastroenterology;;   CESAREAN SECTION     COLONOSCOPY WITH PROPOFOL N/A 02/21/2023   Procedure: COLONOSCOPY WITH PROPOFOL;  Surgeon: Regis Bill, MD;  Location: [facility] ENDOSCOPY;  Service: Endoscopy;  Laterality: N/A;   DILATION AND CURETTAGE OF UTERUS     FLEXIBLE SIGMOIDOSCOPY N/A 07/26/2023   Procedure: FLEXIBLE SIGMOIDOSCOPY;  Surgeon: Toney Reil, MD;  Location: [facility] ENDOSCOPY;  Service: Gastroenterology;  Laterality: N/A;   IR IMAGING GUIDED PORT INSERTION  02/16/2023    Social History   Socioeconomic History   Marital status: Divorced    Spouse name: Not on file   Number of children: Not on file   Years of education: Not on file   Highest education level: Not on file  Occupational History   Not on file  Tobacco Use   Smoking status: Former    Current packs/day: 0.00    Types: Cigarettes    Quit date: 12/19/2022    Years since quitting: 0.6   Smokeless tobacco: Never   Tobacco comments:    Quit smoking 12/2022  Vaping Use   Vaping status: Never Used  Substance and Sexual Activity   Alcohol use: Yes    Comment: occ   Drug use: No   Sexual activity: Yes  Other Topics Concern   Not on file  Social History Narrative   Not on file   Social Determinants of Health   Financial Resource Strain: Low Risk  (05/09/2023)   Received [facility], [facility]   Overall Financial Resource Strain (CARDIA)    Difficulty of Paying Living Expenses: Not hard at all  Recent Concern: Financial Resource Strain - Medium Risk (02/15/2023)   Overall Financial Resource Strain (CARDIA)    Difficulty of Paying Living Expenses: Somewhat hard  Food Insecurity: Food Insecurity Present  (07/22/2023)   Hunger Vital Sign    Worried About Running Out of Food in the Last Year: Sometimes true    Ran Out of Food in the Last Year: Sometimes true  Transportation Needs: Unmet Transportation Needs (07/22/2023)   PRAPARE - Administrator, Civil Service (Medical): Yes    Lack of Transportation (Non-Medical): Yes  Physical Activity: Inactive (02/15/2023)   Exercise Vital Sign    Days of Exercise per Week: 0 days    Minutes of Exercise per Session: 0 min  Stress: Stress Concern Present (02/15/2023)   [employer] of Occupational Health - Occupational Stress Questionnaire    Feeling of Stress : To some extent  Social Connections: Socially Isolated (02/15/2023)   Social Connection and Isolation Panel [NHANES]    Frequency of Communication with Friends and Family: More than three times a week    Frequency of Social Gatherings with Friends and Family: Once a week    Attends Religious Services: Never    Database administrator or Organizations: No    Attends Banker Meetings: Never    Marital Status: Divorced  Catering manager Violence: Not At Risk (07/22/2023)   Humiliation, Afraid, Rape, and Kick questionnaire    Fear of Current or Ex-Partner: No    Emotionally Abused: No    Physically Abused: No    Sexually Abused: No    Family History  Problem Relation Age of Onset   Cancer Maternal Grandmother    Allergies  Allergen Reactions   Nifedipine Palpitations   I? Current Facility-Administered Medications  Medication Dose Route Frequency Provider Last Rate Last Admin   0.9 %  sodium chloride infusion (Manually program via Guardrails IV Fluids)   Intravenous Once Delfino Lovett, MD       0.9 %  sodium chloride infusion   Intravenous PRN Enedina Finner, MD   Stopped at 07/25/23 1757   acetaminophen (TYLENOL) tablet 650 mg  650 mg Oral Q6H PRN Mikey College T, MD   650 mg at 07/28/23 0019   acidophilus (RISAQUAD) capsule 2 capsule  2 capsule Oral Daily Enedina Finner,  MD   2 capsule at 07/26/23 0854   albuterol (PROVENTIL) (2.5 MG/3ML) 0.083% nebulizer solution 2.5 mg  2.5 mg Nebulization Q4H PRN Manuela Schwartz, NP   2.5 mg at 07/24/23 2001   busPIRone (BUSPAR) tablet 5 mg  5 mg Oral BID Enedina Finner, MD   5 mg at 07/27/23 2111   Chlorhexidine Gluconate Cloth 2 % PADS 6 each  6 each Topical Daily Jeralyn Ruths, MD       cyanocobalamin (VITAMIN B12) tablet 500 mcg  500 mcg Oral Daily Enedina Finner, MD   500 mcg at 07/26/23 0855   dicyclomine (BENTYL) tablet 20 mg  20 mg Oral TID PRN Toney Reil, MD   20 mg at 07/27/23 1326   DULoxetine (CYMBALTA) DR capsule 60 mg  60 mg Oral Daily Enedina Finner, MD   60  mg at 07/26/23 0856   gabapentin (NEURONTIN) capsule 300 mg  300 mg Oral TID Mikey College T, MD   300 mg at 07/26/23 1811   hydrocortisone (CORTENEMA) enema 100 mg  100 mg Rectal QHS Toney Reil, MD   100 mg at 07/27/23 2114   HYDROmorphone (DILAUDID) injection 0.5-1 mg  0.5-1 mg Intravenous Q4H PRN Borders, Daryl Eastern, NP   1 mg at 07/28/23 1129   lactobacillus (FLORANEX/LACTINEX) granules 1 g  1 g Oral TID WC Mikey College T, MD   1 g at 07/27/23 1727   lactose free nutrition (BOOST PLUS) liquid 237 mL  237 mL Oral TID WC Enedina Finner, MD   237 mL at 07/27/23 1727   loperamide (IMODIUM) capsule 2 mg  2 mg Oral Q6H PRN Delfino Lovett, MD   2 mg at 07/27/23 1727   megestrol (MEGACE) tablet 40 mg  40 mg Oral Daily Mikey College T, MD   40 mg at 07/26/23 3614   multivitamin with minerals tablet 1 tablet  1 tablet Oral Daily Enedina Finner, MD   1 tablet at 07/26/23 0855   ondansetron (ZOFRAN) injection 4 mg  4 mg Intravenous Q6H PRN Mikey College T, MD   4 mg at 07/28/23 0944   oxyCODONE-acetaminophen (PERCOCET/ROXICET) 5-325 MG per tablet 1-2 tablet  1-2 tablet Oral Q4H PRN Enedina Finner, MD   1 tablet at 07/28/23 4315   And   oxyCODONE (Oxy IR/ROXICODONE) immediate release tablet 5 mg  5 mg Oral Q4H PRN Enedina Finner, MD   5 mg at 07/28/23 0429   pantoprazole  (PROTONIX) EC tablet 40 mg  40 mg Oral Daily Mikey College T, MD   40 mg at 07/26/23 0855   potassium chloride 10 mEq in 100 mL IVPB  10 mEq Intravenous Q1 Hr x 4 Lowella Bandy, RPH       potassium chloride SA (KLOR-CON M) CR tablet 40 mEq  40 mEq Oral Once Jawo, Modou L, NP       predniSONE (DELTASONE) tablet 10 mg  10 mg Oral Once Lowella Bandy, [facility]       [START ON 07/29/2023] predniSONE (DELTASONE) tablet 50 mg  50 mg Oral Q breakfast Delfino Lovett, MD       Followed by   Melene Muller ON 07/30/2023] predniSONE (DELTASONE) tablet 40 mg  40 mg Oral Q breakfast Delfino Lovett, MD       Followed by   Melene Muller ON 07/31/2023] predniSONE (DELTASONE) tablet 30 mg  30 mg Oral Q breakfast Delfino Lovett, MD       Followed by   Melene Muller ON 08/01/2023] predniSONE (DELTASONE) tablet 20 mg  20 mg Oral Q breakfast Delfino Lovett, MD       Followed by   Melene Muller ON 08/02/2023] predniSONE (DELTASONE) tablet 10 mg  10 mg Oral Q breakfast Sherryll Burger, Vipul, MD       sodium chloride flush (NS) 0.9 % injection 10-40 mL  10-40 mL Intracatheter Q12H Finnegan, Tollie Pizza, MD       sodium chloride flush (NS) 0.9 % injection 10-40 mL  10-40 mL Intracatheter PRN Jeralyn Ruths, MD       traMADol Janean Sark) tablet 50 mg  50 mg Oral Q12H PRN Enedina Finner, MD   50 mg at 07/25/23 0250   traZODone (DESYREL) tablet 25 mg  25 mg Oral QHS PRN Mansy, Jan A, MD   25 mg at 07/26/23 2209   Vitamin D (Ergocalciferol) (DRISDOL) 1.25  MG (50000 UNIT) capsule 50,000 Units  50,000 Units Oral Q7 days Enedina Finner, MD   50,000 Units at 07/24/23 2034   Facility-Administered Medications Ordered in Other Encounters  Medication Dose Route Frequency Provider Last Rate Last Admin   sodium chloride flush (NS) 0.9 % injection 10 mL  10 mL Intracatheter PRN Jeralyn Ruths, MD   10 mL at 06/29/23 1430     Abtx:  Anti-infectives (From admission, onward)    Start     Dose/Rate Route Frequency Ordered Stop   07/24/23 2200  amoxicillin-clavulanate (AUGMENTIN) 875-125 MG  per tablet 1 tablet  Status:  Discontinued        1 tablet Oral Every 12 hours 07/24/23 1427 07/24/23 1832   07/24/23 1845  Ampicillin-Sulbactam (UNASYN) 3 g in sodium chloride 0.9 % 100 mL IVPB  Status:  Discontinued        3 g 200 mL/hr over 30 Minutes Intravenous Every 6 hours 07/24/23 1833 07/25/23 1826   07/22/23 1500  metroNIDAZOLE (FLAGYL) IVPB 500 mg  Status:  Discontinued        500 mg 100 mL/hr over 60 Minutes Intravenous Every 12 hours 07/22/23 0735 07/24/23 1427   07/22/23 1000  cefTRIAXone (ROCEPHIN) 2 g in sodium chloride 0.9 % 100 mL IVPB  Status:  Discontinued        2 g 200 mL/hr over 30 Minutes Intravenous Every 24 hours 07/22/23 0735 07/24/23 1427   07/22/23 0330  vancomycin (VANCOCIN) IVPB 750 mg/150 ml premix       Placed in "And" Linked Group   750 mg 150 mL/hr over 60 Minutes Intravenous  Once 07/22/23 0225 07/22/23 0641   07/22/23 0230  vancomycin (VANCOCIN) IVPB 1000 mg/200 mL premix       Placed in "And" Linked Group   1,000 mg 200 mL/hr over 60 Minutes Intravenous  Once 07/22/23 0225 07/22/23 0544   07/22/23 0215  ceFEPIme (MAXIPIME) 2 g in sodium chloride 0.9 % 100 mL IVPB        2 g 200 mL/hr over 30 Minutes Intravenous  Once 07/22/23 0206 07/22/23 0301   07/22/23 0215  metroNIDAZOLE (FLAGYL) IVPB 500 mg        500 mg 100 mL/hr over 60 Minutes Intravenous  Once 07/22/23 0206 07/22/23 0433   07/22/23 0215  vancomycin (VANCOCIN) IVPB 1000 mg/200 mL premix  Status:  Discontinued        1,000 mg 200 mL/hr over 60 Minutes Intravenous  Once 07/22/23 0206 07/22/23 0210   07/22/23 0215  vancomycin (VANCOCIN) 1,750 mg in sodium chloride 0.9 % 500 mL IVPB  Status:  Discontinued        1,750 mg 258.8 mL/hr over 120 Minutes Intravenous  Once 07/22/23 0210 07/22/23 0223       REVIEW OF SYSTEMS:  Const: negative fever, + chills,  weight loss Eyes: negative diplopia or visual changes, negative eye pain ENT: negative coryza, negative sore throat Resp: negative  cough, hemoptysis, dyspnea Cards: negative for chest pain, palpitations, lower extremity edema GU: negative for frequency, dysuria and hematuria GI: LLQ abdominal pain, diarrhea, no bleeding,  Skin: negative for rash and pruritus Heme: negative for easy bruising and gum/nose bleeding MS:  back pain and muscle weakness Neurolo:negative for headaches, dizziness, vertigo, memory problems  Psych:  anxiety, depression  Endocrine: negative for thyroid, diabetes Allergy/Immunology- negative for any medication or food allergies ?  Objective:  VITALS:  BP (!) 152/92   Pulse 89   Temp 98.2  F (36.8 C) (Oral)   Resp 18   Ht 5' (1.524 m)   Wt 77.1 kg   LMP  (LMP Unknown)   SpO2 100%   BMI 33.20 kg/m  LDA PORT PHYSICAL EXAM:  General: Alert, cooperative, no distress, appears stated age.  Head: Normocephalic, without obvious abnormality, atraumatic. Eyes: Conjunctivae clear, anicteric sclerae. Pupils are equal ENT Nares normal. No drainage or sinus tenderness. Lips, mucosa, and tongue normal. No Thrush Neck: Supple, symmetrical, no adenopathy, thyroid: non tender no carotid bruit and no JVD. Back: No CVA tenderness. Lungs: Clear to auscultation bilaterally. No Wheezing or Rhonchi. No rales. Heart: Regular rate and rhythm, no murmur, rub or gallop. Abdomen: Soft, tenderness lower abdomen Extremities: atraumatic, no cyanosis. No edema. No clubbing Skin: No rashes or lesions. Or bruising Lymph: left axillary lymphadenopathy Neurologic: Grossly non-focal Pertinent Labs Lab Results CBC    Latest Ref Rng & Units 07/28/2023    4:22 AM 07/23/2023    3:26 AM 07/22/2023    2:25 AM  CBC  WBC 4.0 - 10.5 K/uL 1.1  5.3  5.8   Hemoglobin 12.0 - 15.0 g/dL 7.3  8.9  40.9   Hematocrit 36.0 - 46.0 % 20.5  25.7  31.5   Platelets 150 - 400 K/uL 11  51  85          Latest Ref Rng & Units 07/28/2023    4:22 AM 07/23/2023    3:26 AM 07/22/2023    4:25 PM  CMP  Glucose 70 - 99 mg/dL 811  85     BUN 6 - 20 mg/dL 21  12    Creatinine 9.14 - 1.00 mg/dL 7.82  9.56    Sodium 213 - 145 mmol/L 133  130  129   Potassium 3.5 - 5.1 mmol/L 2.8  3.5    Chloride 98 - 111 mmol/L 101  103    CO2 22 - 32 mmol/L 22  18    Calcium 8.9 - 10.3 mg/dL 7.7  7.7    Total Protein 6.5 - 8.1 g/dL  5.9    Total Bilirubin 0.3 - 1.2 mg/dL  0.9    Alkaline Phos 38 - 126 U/L  247    AST 15 - 41 U/L  27    ALT 0 - 44 U/L  33        Microbiology: Recent Results (from the past 240 hour(s))  Resp panel by RT-PCR (RSV, Flu A&B, Covid) Anterior Nasal Swab     Status: None   Collection Time: 07/22/23  2:25 AM   Specimen: Anterior Nasal Swab  Result Value Ref Range Status   SARS Coronavirus 2 by RT PCR NEGATIVE NEGATIVE Final    Comment: (NOTE) SARS-CoV-2 target nucleic acids are NOT DETECTED.  The SARS-CoV-2 RNA is generally detectable in upper respiratory specimens during the acute phase of infection. The lowest concentration of SARS-CoV-2 viral copies this assay can detect is 138 copies/mL. A negative result does not preclude SARS-Cov-2 infection and should not be used as the sole basis for treatment or other patient management decisions. A negative result may occur with  improper specimen collection/handling, submission of specimen other than nasopharyngeal swab, presence of viral mutation(s) within the areas targeted by this assay, and inadequate number of viral copies(<138 copies/mL). A negative result must be combined with clinical observations, patient history, and epidemiological information. The expected result is Negative.  Fact Sheet for Patients:  BloggerCourse.com  Fact Sheet for Healthcare Providers:  SeriousBroker.it  This test is no t yet approved or cleared by the [place] and  has been authorized for detection and/or diagnosis of SARS-CoV-2 by FDA under an Emergency Use Authorization (EUA). This EUA will remain  in  effect (meaning this test can be used) for the duration of the COVID-19 declaration under Section 564(b)(1) of the Act, 21 U.S.C.section 360bbb-3(b)(1), unless the authorization is terminated  or revoked sooner.       Influenza A by PCR NEGATIVE NEGATIVE Final   Influenza B by PCR NEGATIVE NEGATIVE Final    Comment: (NOTE) The Xpert Xpress SARS-CoV-2/FLU/RSV plus assay is intended as an aid in the diagnosis of influenza from Nasopharyngeal swab specimens and should not be used as a sole basis for treatment. Nasal washings and aspirates are unacceptable for Xpert Xpress SARS-CoV-2/FLU/RSV testing.  Fact Sheet for Patients: BloggerCourse.com  Fact Sheet for Healthcare Providers: SeriousBroker.it  This test is not yet approved or cleared by the [place] FDA and has been authorized for detection and/or diagnosis of SARS-CoV-2 by FDA under an Emergency Use Authorization (EUA). This EUA will remain in effect (meaning this test can be used) for the duration of the COVID-19 declaration under Section 564(b)(1) of the Act, 21 U.S.C. section 360bbb-3(b)(1), unless the authorization is terminated or revoked.     Resp Syncytial Virus by PCR NEGATIVE NEGATIVE Final    Comment: (NOTE) Fact Sheet for Patients: BloggerCourse.com  Fact Sheet for Healthcare Providers: SeriousBroker.it  This test is not yet approved or cleared by the [place] FDA and has been authorized for detection and/or diagnosis of SARS-CoV-2 by FDA under an Emergency Use Authorization (EUA). This EUA will remain in effect (meaning this test can be used) for the duration of the COVID-19 declaration under Section 564(b)(1) of the Act, 21 U.S.C. section 360bbb-3(b)(1), unless the authorization is terminated or revoked.  Performed at [facility], [address] [place], [place] [address]   Blood  Culture (routine x 2)     Status: None   Collection Time: 07/22/23  2:25 AM   Specimen: BLOOD  Result Value Ref Range Status   Specimen Description BLOOD  RAC  Final   Special Requests   Final    BOTTLES DRAWN AEROBIC AND ANAEROBIC Blood Culture results may not be optimal due to an excessive volume of blood received in culture bottles   Culture   Final    NO GROWTH 5 DAYS Performed at [facility], [address], [place], [place] [address]    Report Status 07/27/2023 FINAL  Final  Blood Culture (routine x 2)     Status: None   Collection Time: 07/22/23  2:26 AM   Specimen: BLOOD  Result Value Ref Range Status   Specimen Description BLOOD  LH  Final   Special Requests   Final    BOTTLES DRAWN AEROBIC AND ANAEROBIC Blood Culture adequate volume   Culture   Final    NO GROWTH 5 DAYS Performed at [facility], [address] [place], [place] [address]    Report Status 07/27/2023 FINAL  Final  C Difficile Quick Screen w PCR reflex     Status: None   Collection Time: 07/22/23 12:19 PM   Specimen: STOOL  Result Value Ref Range Status   C Diff antigen NEGATIVE NEGATIVE Final   C Diff toxin NEGATIVE NEGATIVE Final   C Diff interpretation No C. difficile detected.  Final    Comment: Performed at [facility], [id number]  [address] [place], [place] [address]  Gastrointestinal Panel by PCR , Stool     Status: None   Collection Time: 07/22/23 12:19 PM   Specimen: Stool  Result Value Ref Range Status   Campylobacter species NOT DETECTED NOT DETECTED Final   Plesimonas shigelloides NOT DETECTED NOT DETECTED Final   Salmonella species NOT DETECTED NOT DETECTED Final   Yersinia enterocolitica NOT DETECTED NOT DETECTED Final   Vibrio species NOT DETECTED NOT DETECTED Final   Vibrio cholerae NOT DETECTED NOT DETECTED Final   Enteroaggregative E coli (EAEC) NOT DETECTED NOT DETECTED Final   Enteropathogenic E coli (EPEC) NOT DETECTED NOT DETECTED Final    Enterotoxigenic E coli (ETEC) NOT DETECTED NOT DETECTED Final   Shiga like toxin producing E coli (STEC) NOT DETECTED NOT DETECTED Final   Shigella/Enteroinvasive E coli (EIEC) NOT DETECTED NOT DETECTED Final   Cryptosporidium NOT DETECTED NOT DETECTED Final   Cyclospora cayetanensis NOT DETECTED NOT DETECTED Final   Entamoeba histolytica NOT DETECTED NOT DETECTED Final   Giardia lamblia NOT DETECTED NOT DETECTED Final   Adenovirus F40/41 NOT DETECTED NOT DETECTED Final   Astrovirus NOT DETECTED NOT DETECTED Final   Norovirus GI/GII NOT DETECTED NOT DETECTED Final   Rotavirus A NOT DETECTED NOT DETECTED Final   Sapovirus (I, II, IV, and V) NOT DETECTED NOT DETECTED Final    Comment: Performed at [facility], [address] [place], [place] [address]    IMAGING RESULTS: CT Chest , abdomen and pelvis reviewed Inflammation recto and sigmoid colon I have personally reviewed the films ? Impression/Recommendation Rectosigmoid colitis.  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  Will send CMV DNA PCR.  She will need induction therapy.  Depending on her progress we may could decide on p.o.  patient has severe pancytopenia secondary to chemotherapy and need to keep a close eye on it while on ganciclovir. Need to give her G-CSF  Metastatic signet cell adenocarcinoma On chemotherapy has received 7 cycles of FOLFOX and Pembroluzimab  Pancytopenia secondary to chemo  Discontinue probiotics as risk for translocation and causing t lactobacillus bacteremia   Discussed the management with the patient and great detail Discussed with her partner Discussed with care team ___________________________________________________ Discussed with patient, requesting provider Note:  This document was prepared using Dragon voice  recognition software and may include unintentional dictation errors.

## 2023-07-28 NOTE — Progress Notes (Signed)
At bedside re PIV request.  Explained to pt the reason to access PAC.  Pt initially declined due to pain with insertion and prefers PIV.  Educated pt on increased risks for infection to the [facility] with each peripheral stick for IV or labs and potassium via PIV vs PAC.  Pt agreeable to ice pack placement  to [facility] site prior to access.  Ice placed with wash cloth between ice and skin.

## 2023-07-29 ENCOUNTER — Encounter: Payer: Self-pay | Admitting: Oncology

## 2023-07-29 ENCOUNTER — Other Ambulatory Visit (HOSPITAL_COMMUNITY): Payer: Self-pay

## 2023-07-29 ENCOUNTER — Telehealth (HOSPITAL_COMMUNITY): Payer: Self-pay | Admitting: Pharmacy Technician

## 2023-07-29 DIAGNOSIS — D61811 Other drug-induced pancytopenia: Secondary | ICD-10-CM | POA: Diagnosis not present

## 2023-07-29 DIAGNOSIS — K5150 Left sided colitis without complications: Secondary | ICD-10-CM | POA: Diagnosis not present

## 2023-07-29 DIAGNOSIS — D61818 Other pancytopenia: Secondary | ICD-10-CM

## 2023-07-29 DIAGNOSIS — C801 Malignant (primary) neoplasm, unspecified: Secondary | ICD-10-CM | POA: Diagnosis not present

## 2023-07-29 DIAGNOSIS — E876 Hypokalemia: Secondary | ICD-10-CM

## 2023-07-29 DIAGNOSIS — K529 Noninfective gastroenteritis and colitis, unspecified: Secondary | ICD-10-CM | POA: Diagnosis not present

## 2023-07-29 LAB — BASIC METABOLIC PANEL
Anion gap: 10 (ref 5–15)
BUN: 16 mg/dL (ref 6–20)
CO2: 23 mmol/L (ref 22–32)
Calcium: 7.6 mg/dL — ABNORMAL LOW (ref 8.9–10.3)
Chloride: 100 mmol/L (ref 98–111)
Creatinine, Ser: 0.53 mg/dL (ref 0.44–1.00)
GFR, Estimated: >60 mL/min (ref >60)
Glucose, Bld: 111 mg/dL — ABNORMAL HIGH (ref 70–99)
Potassium: 2.9 mmol/L — ABNORMAL LOW (ref 3.5–5.1)
Sodium: 133 mmol/L — ABNORMAL LOW (ref 135–145)

## 2023-07-29 LAB — CBC
HCT: 19.8 % — ABNORMAL LOW (ref 36.0–46.0)
Hemoglobin: 7 g/dL — ABNORMAL LOW (ref 12.0–15.0)
MCH: 32.1 pg (ref 26.0–34.0)
MCHC: 35.4 g/dL (ref 30.0–36.0)
MCV: 90.8 fL (ref 80.0–100.0)
Platelets: 11 10*3/uL — CL (ref 150–400)
RBC: 2.18 MIL/uL — ABNORMAL LOW (ref 3.87–5.11)
RDW: 14.6 % (ref 11.5–15.5)
WBC: 1.8 10*3/uL — ABNORMAL LOW (ref 4.0–10.5)
nRBC: 6.5 % — ABNORMAL HIGH (ref 0.0–0.2)

## 2023-07-29 LAB — MAGNESIUM: Magnesium: 2.1 mg/dL (ref 1.7–2.4)

## 2023-07-29 LAB — PHOSPHORUS: Phosphorus: 3 mg/dL (ref 2.5–4.6)

## 2023-07-29 LAB — PREPARE RBC (CROSSMATCH)

## 2023-07-29 MED ORDER — POTASSIUM CHLORIDE 10 MEQ/100ML IV SOLN: 10.0000 meq | INTRAVENOUS | Status: DC

## 2023-07-29 MED ORDER — SODIUM CHLORIDE 0.9% IV SOLUTION: Freq: Once | INTRAVENOUS | Status: AC

## 2023-07-29 MED ORDER — SODIUM CHLORIDE 0.9 % IV SOLN
INTRAVENOUS | Status: DC
  Filled 2023-07-29: qty 1000

## 2023-07-29 MED ORDER — SODIUM CHLORIDE 0.9 % IV SOLN
INTRAVENOUS | Status: DC
  Filled 2023-07-29 (×5): qty 500

## 2023-07-29 MED ORDER — HYDROCORTISONE 100 MG/60ML RE ENEM: 100.0000 mg | ENEMA | Freq: Every day | RECTAL | Status: DC

## 2023-07-29 NOTE — Progress Notes (Signed)
Critical platelets of 11 resulted. Dr. Sherryll Burger and Manuela Schwartz NP made aware. No new orders at this time.

## 2023-07-29 NOTE — Telephone Encounter (Signed)
Pharmacy Patient Advocate Encounter   Received notification that prior authorization for valGANciclovir HCl 450MG  tablets is required/requested.   Insurance verification completed.   The patient is insured through [employer] [facility] .   Per test claim: PA required; PA submitted to [employer] [facility] via CoverMyMeds Key/confirmation #/EOC [id number] Status is pending

## 2023-07-29 NOTE — Progress Notes (Signed)
[facility]  - [place] at [facility]   PATIENT NAME: Sheryl Silva    MR#:  [id number]  DATE OF BIRTH:  01-29-1976  SUBJECTIVE:  Ambulated in the hallway.  Feels tired and dyspneic on exertion.  Boyfriend at bedside.  They are appreciative of her care VITALS:  Blood pressure (!) 150/96, pulse (!) 104, temperature 98.7 F (37.1 C), temperature source Oral, resp. rate 18, height 5' (1.524 m), weight 77.1 kg, SpO2 100%.  PHYSICAL EXAMINATION:   GENERAL:  47 y.o.-year-old patient with no acute distress.  LUNGS: Normal breath sounds bilaterally CARDIOVASCULAR: S1, S2 normal. No murmur   ABDOMEN: Soft, mild tenderness, nondistended. EXTREMITIES: No  edema b/l.    NEUROLOGIC: nonfocal  patient is alert and awake  LABORATORY PANEL:  CBC Recent Labs  Lab 07/29/23 0524  WBC 1.8*  HGB 7.0*  HCT 19.8*  PLT 11*    Chemistries  Recent Labs  Lab 07/23/23 0326 07/28/23 0422 07/29/23 0524  NA 130*   < > 133*  K 3.5   < > 2.9*  CL 103   < > 100  CO2 18*   < > 23  GLUCOSE 85   < > 111*  BUN 12   < > 16  CREATININE 0.57   < > 0.53  CALCIUM 7.7*   < > 7.6*  MG  --    < > 2.1  AST 27  --   --   ALT 33  --   --   ALKPHOS 247*  --   --   BILITOT 0.9  --   --    < > = values in this interval not displayed.    RADIOLOGY:  No results found.  Assessment and Plan  Sheryl Silva is a 47 y.o. 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  Patient was diagnosed with stage IV metastatic cancer initially was with on no arranging, pathology from axillary lymph node showed possible GI source.  Patient was started on chemo and radiation therapy with mFLFOX6 q. 14 days and so far she completed 7 cycles.  She said that with each cycle of chemotherapy she has had mild diarrhea but was never severe and or  self-limiting.  Last chemotherapy and radiation was 9/16-17 Monday, patient started develop severe cramping pain in the left lower quadrant for 2 days then started to have a severe diarrhea,   CTA negative for PE, extensive evidence of metastatic cancer involving the bones, lymph nodes.  Acute colitis versus diverticulitis descending and sigmoid colon.  Left adnexal pelvic mass.  Pelvic ultrasound showed heterogeneous hypervascular left adnexal mass probably metastatic in nature.   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  Started on IV ganciclovir  Severe hypokalemia -- Pharmacy managing this aggressively and replating  Hyponatremia -- likely from diarrhea and poor PO intake -- sodium improving.  Diarrhea slowing down  Cardiomegaly and small pericardial effusion -She reports shortness of breath  with exertion which is likely due to severe anemia.  Giving 1 unit of PRBC transfusion  Stage IV metastatic signet ring adenocarcinoma -- patient follows with Dr. Orlie Dakin -- patient getting chemotherapy. She finished radiation therapy  Chronic back pain PRN PO and IV pain meds --Palliative care Josh managing pain meds -Continue IV hydromorphone and p.o. Percocet, tramadol  H/o Buerger's dz Tobacco use --stable  Pancytopenia Dr. Orlie Dakin is aware.  He wants to hold off on any further treatment for leukopenia as she had received fulphilia on 9/18. She is in the window where oncology is expecting her white count to start  trending up. Since it's only been one week, it's probably too soon to give her GCSF per oncology Status post 1 platelet transfusion yesterday on 9/26.   I have ordered 1 more platelet transfusion for today and will order 1 PRBC transfusion as her hemoglobin is 7 and she is symptomatic     Latest Ref Rng & Units 07/29/2023    5:24 AM 07/28/2023    4:22 AM 07/23/2023    3:26 AM  CBC  WBC 4.0 - 10.5 K/uL 1.8  1.1  5.3   Hemoglobin 12.0 - 15.0 g/dL 7.0  7.3  8.9   Hematocrit 36.0 - 46.0 % 19.8  20.5  25.7   Platelets 150 - 400 K/uL 11  11  51      Family communication : General at bedside Consults :GI, Oncology, ID CODE STATUS: full DVT Prophylaxis : No need as she is ambulatory.  She is thrombocytopenic cannot give any blood thinners Level of care: Telemetry Medical Status is: Inpatient Remains inpatient appropriate because: acute ischemic/CMP rectosigmoid colitis, still quite symptomatic and severe pancytopenia, hypokalemia   She is critically sick and high risk for cardiorespiratory failure, multiorgan failure and death  TOTAL TIME (critical care) TAKING CARE OF THIS PATIENT: 35 minutes.  >50% time spent on counselling and coordination of care  Note: This dictation was prepared with Dragon dictation along with smaller phrase technology. Any transcriptional errors that result from this process are unintentional.  Delfino Lovett M.D    [facility]   CC: Primary care physician; [name], MD

## 2023-07-29 NOTE — Progress Notes (Signed)
Date of Admission:  07/22/2023      ID: Sheryl Silva is a 47 y.o. female  Principal Problem:   Colitis Active Problems:   Adenocarcinoma (HCC)   Left sided colitis without complications (HCC)   Palliative care encounter    Subjective: Pt has copious diarrhea, says it is due to the enema she is getting Abdominal pain better  Medications:   sodium chloride   Intravenous Once   sodium chloride   Intravenous Once   busPIRone  5 mg Oral BID   Chlorhexidine Gluconate Cloth  6 each Topical Daily   cyanocobalamin  500 mcg Oral Daily   DULoxetine  60 mg Oral Daily   gabapentin  300 mg Oral TID   hydrocortisone  100 mg Rectal QHS   lactose free nutrition  237 mL Oral TID WC   megestrol  40 mg Oral Daily   multivitamin with minerals  1 tablet Oral Daily   pantoprazole  40 mg Oral Daily   [START ON 07/30/2023] predniSONE  40 mg Oral Q breakfast   Followed by   Melene Muller ON 07/31/2023] predniSONE  30 mg Oral Q breakfast   Followed by   Melene Muller ON 08/01/2023] predniSONE  20 mg Oral Q breakfast   Followed by   Melene Muller ON 08/02/2023] predniSONE  10 mg Oral Q breakfast   sodium chloride flush  10-40 mL Intracatheter Q12H   Vitamin D (Ergocalciferol)  50,000 Units Oral Q7 days    Objective: Vital signs in last 24 hours: Patient Vitals for the past 24 hrs:  BP Temp Temp src Pulse Resp SpO2  07/29/23 0811 125/79 98.8 F (37.1 C) -- 90 18 100 %  07/29/23 0536 136/87 98.7 F (37.1 C) Oral 97 18 100 %  07/28/23 2002 (!) 158/84 98.4 F (36.9 C) Oral (!) 101 18 100 %  07/28/23 1526 136/75 98.2 F (36.8 C) Oral 97 18 100 %  07/28/23 1444 (!) 137/96 98 F (36.7 C) Oral (!) 109 18 100 %  07/28/23 1242 (!) 152/92 98.2 F (36.8 C) Oral 89 18 100 %  07/28/23 1227 (!) 146/90 98.4 F (36.9 C) Oral 90 18 100 %     PHYSICAL EXAM:  General: Alert, cooperative,  appears stated age.  Head: Normocephalic, without obvious abnormality, atraumatic. Eyes: Conjunctivae clear, anicteric sclerae.  Pupils are equal ENT Nares normal. No drainage or sinus tenderness. Lips, mucosa, and tongue normal. No Thrush Neck: Supple, symmetrical, no adenopathy, thyroid: non tender no carotid bruit and no JVD. Lungs: Clear to auscultation bilaterally. No Wheezing or Rhonchi. No rales. Heart: Regular rate and rhythm, no murmur, rub or gallop. Abdomen: Soft, minimal tenderness Extremities: atraumatic, no cyanosis. No edema. No clubbing Skin: No rashes or lesions. Or bruising Lymph: Cervical, supraclavicular normal. Neurologic: Grossly non-focal  Lab Results    Latest Ref Rng & Units 07/29/2023    5:24 AM 07/28/2023    4:22 AM 07/23/2023    3:26 AM  CBC  WBC 4.0 - 10.5 K/uL 1.8  1.1  5.3   Hemoglobin 12.0 - 15.0 g/dL 7.0  7.3  8.9   Hematocrit 36.0 - 46.0 % 19.8  20.5  25.7   Platelets 150 - 400 K/uL 11  11  51        Latest Ref Rng & Units 07/29/2023    5:24 AM 07/28/2023    4:22 AM 07/23/2023    3:26 AM  CMP  Glucose 70 - 99 mg/dL 962  952  85  BUN 6 - 20 mg/dL 16  21  12    Creatinine 0.44 - 1.00 mg/dL 1.61  0.96  0.45   Sodium 135 - 145 mmol/L 133  133  130   Potassium 3.5 - 5.1 mmol/L 2.9  2.8  3.5   Chloride 98 - 111 mmol/L 100  101  103   CO2 22 - 32 mmol/L 23  22  18    Calcium 8.9 - 10.3 mg/dL 7.6  7.7  7.7   Total Protein 6.5 - 8.1 g/dL   5.9   Total Bilirubin 0.3 - 1.2 mg/dL   0.9   Alkaline Phos 38 - 126 U/L   247   AST 15 - 41 U/L   27   ALT 0 - 44 U/L   33       Microbiology: CMV PCR sent Studies/Results: No results found.   Assessment/Plan: Rectosigmoid colitis.  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  Sent CMV DNA PCR.  She will need induction therapy.  Depending on her progress we may decide on p.o.valganciclovir   patient has severe pancytopenia secondary to chemotherapy and need to keep a close  eye on it while on ganciclovir. Received Fulphilia last week Getting PRBC   Metastatic signet cell adenocarcinoma On chemotherapy has received 7 cycles of FOLFOX and Pembroluzimab     Discontinued probiotics as risk for translocation and causing  lactobacillus bacteremia is increased     Discussed the management with the patient and great detail Discussed with her partner Discussed with care team ID will follow her peripherally this weekend- call if needed

## 2023-07-29 NOTE — Progress Notes (Signed)
Mobility Specialist - Progress Note     07/29/23 0936  Mobility  Activity Ambulated independently in hallway  Level of Assistance Modified independent, requires aide device or extra time  Assistive Device Front wheel walker  Distance Ambulated (ft) 320 ft  Range of Motion/Exercises Active  Activity Response Tolerated well  Mobility Referral Yes  $Mobility charge 1 Mobility  Mobility Specialist Start Time (ACUTE ONLY) 0914  Mobility Specialist Stop Time (ACUTE ONLY) 0932  Mobility Specialist Time Calculation (min) (ACUTE ONLY) 18 min   Pt resting in chair near window upon entry. Pt STS and ambulates to hallway around NS ModI for 2 laps with RW due to weakness and furniture walking in room. Pt endorsed pain in back (RN Notified) and returned to chair and left with needs in reach.    Sheryl Silva Mobility Specialist 07/29/23, 9:41 AM

## 2023-07-29 NOTE — Telephone Encounter (Signed)
Pharmacy Patient Advocate Encounter  Received notification from [facility] that Prior Authorization for valGANciclovir HCl 450MG  tablets  has been APPROVED from 07/29/2023 to 07/28/2024. Ran test claim, Copay is $0.00. This test claim was processed through [facility]- copay amounts may vary at other pharmacies due to pharmacy/plan contracts, or as the patient moves through the different stages of their insurance plan.   PA #/Case ID/Reference #: [id number]

## 2023-07-29 NOTE — Plan of Care (Signed)
°  Problem: Education: Goal: Knowledge of General Education information will improve Description: Including pain rating scale, medication(s)/side effects and non-pharmacologic comfort measures Outcome: Progressing   Problem: Health Behavior/Discharge Planning: Goal: Ability to manage health-related needs will improve Outcome: Progressing   Problem: Clinical Measurements: Goal: Ability to maintain clinical measurements within normal limits will improve Outcome: Progressing Goal: Will remain free from infection Outcome: Progressing Goal: Diagnostic test results will improve Outcome: Progressing Goal: Respiratory complications will improve Outcome: Progressing Goal: Cardiovascular complication will be avoided Outcome: Progressing   Problem: Activity: Goal: Risk for activity intolerance will decrease Outcome: Progressing   Problem: Nutrition: Goal: Adequate nutrition will be maintained Outcome: Progressing   Problem: Coping: Goal: Level of anxiety will decrease Outcome: Progressing   Problem: Elimination: Goal: Will not experience complications related to bowel motility Outcome: Progressing Goal: Will not experience complications related to urinary retention Outcome: Progressing   Problem: Pain Managment: Goal: General experience of comfort will improve Outcome: Progressing   

## 2023-07-29 NOTE — Progress Notes (Signed)
PHARMACY CONSULT NOTE - FOLLOW UP  Pharmacy Consult for Electrolyte Monitoring and Replacement   Recent Labs: Potassium (mmol/L)  Date Value  07/29/2023 2.9 (L)   Magnesium (mg/dL)  Date Value  16/08/9603 2.1   Calcium (mg/dL)  Date Value  54/07/8118 7.6 (L)   Albumin (g/dL)  Date Value  14/78/2956 2.9 (L)   Phosphorus (mg/dL)  Date Value  21/30/8657 3.0   Sodium (mmol/L)  Date Value  07/29/2023 133 (L)    Assessment: 47 y.o. 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 Pharmacy is asked to follow and replace electrolytes  Goal of Therapy:  Electrolytes WNL  Plan:  ---50 mEq IV KCl in 500 mL 0.9% NaCl to facilitate ease of administration  ---recheck electrolytes in am  Lowella Bandy ,PharmD, BCPS Clinical Pharmacist 07/29/2023 7:16 AM

## 2023-07-29 NOTE — TOC Benefit Eligibility Note (Signed)
Patient Product/process development scientist completed.    The patient is insured through [employer] and [facility] MEDICAID.     Ran test claim for valganciclovir 450 mg and Requires Prior Authorization   This test claim was processed through [employer]- copay amounts may vary at other pharmacies due to [employer], or as the patient moves through the different stages of their insurance plan.     Roland Earl, CPHT Pharmacy Technician III Certified Patient Advocate [facility] Pharmacy Patient Advocate Team Direct Number: [phone]  Fax: [phone]

## 2023-07-30 ENCOUNTER — Inpatient Hospital Stay: Payer: 59

## 2023-07-30 DIAGNOSIS — A0839 Other viral enteritis: Secondary | ICD-10-CM

## 2023-07-30 DIAGNOSIS — M549 Dorsalgia, unspecified: Secondary | ICD-10-CM

## 2023-07-30 DIAGNOSIS — C801 Malignant (primary) neoplasm, unspecified: Secondary | ICD-10-CM | POA: Diagnosis not present

## 2023-07-30 DIAGNOSIS — K529 Noninfective gastroenteritis and colitis, unspecified: Secondary | ICD-10-CM | POA: Diagnosis not present

## 2023-07-30 DIAGNOSIS — K5150 Left sided colitis without complications: Secondary | ICD-10-CM | POA: Diagnosis not present

## 2023-07-30 DIAGNOSIS — D61818 Other pancytopenia: Secondary | ICD-10-CM | POA: Diagnosis not present

## 2023-07-30 DIAGNOSIS — D61811 Other drug-induced pancytopenia: Secondary | ICD-10-CM | POA: Diagnosis not present

## 2023-07-30 LAB — TYPE AND SCREEN
ABO/RH(D): O POS
Antibody Screen: NEGATIVE
Unit division: 0

## 2023-07-30 LAB — PREPARE PLATELET PHERESIS: Unit division: 0

## 2023-07-30 LAB — BASIC METABOLIC PANEL
Anion gap: 10 (ref 5–15)
BUN: 15 mg/dL (ref 6–20)
CO2: 21 mmol/L — ABNORMAL LOW (ref 22–32)
Calcium: 7.6 mg/dL — ABNORMAL LOW (ref 8.9–10.3)
Chloride: 102 mmol/L (ref 98–111)
Creatinine, Ser: 0.56 mg/dL (ref 0.44–1.00)
GFR, Estimated: >60 mL/min (ref >60)
Glucose, Bld: 126 mg/dL — ABNORMAL HIGH (ref 70–99)
Potassium: 3 mmol/L — ABNORMAL LOW (ref 3.5–5.1)
Sodium: 133 mmol/L — ABNORMAL LOW (ref 135–145)

## 2023-07-30 LAB — CBC
HCT: 24.1 % — ABNORMAL LOW (ref 36.0–46.0)
Hemoglobin: 8.3 g/dL — ABNORMAL LOW (ref 12.0–15.0)
MCH: 31 pg (ref 26.0–34.0)
MCHC: 34.4 g/dL (ref 30.0–36.0)
MCV: 89.9 fL (ref 80.0–100.0)
Platelets: 46 10*3/uL — ABNORMAL LOW (ref 150–400)
RBC: 2.68 MIL/uL — ABNORMAL LOW (ref 3.87–5.11)
RDW: 15.1 % (ref 11.5–15.5)
WBC: 2.9 10*3/uL — ABNORMAL LOW (ref 4.0–10.5)
nRBC: 7.5 % — ABNORMAL HIGH (ref 0.0–0.2)

## 2023-07-30 LAB — BPAM PLATELET PHERESIS
Blood Product Expiration Date: 202409282359
ISSUE DATE / TIME: 202409271633
Unit Type and Rh: 5100

## 2023-07-30 LAB — BPAM RBC
Blood Product Expiration Date: 202410282359
ISSUE DATE / TIME: 202409271255
Unit Type and Rh: 5100

## 2023-07-30 LAB — MAGNESIUM: Magnesium: 2 mg/dL (ref 1.7–2.4)

## 2023-07-30 MED ORDER — CYCLOBENZAPRINE HCL 10 MG PO TABS
5.0000 mg | ORAL_TABLET | Freq: Three times a day (TID) | ORAL | Status: DC | PRN
  Administered 2023-07-30: 5 mg via ORAL
  Filled 2023-07-30: qty 1

## 2023-07-30 MED ORDER — HYDROMORPHONE HCL 1 MG/ML IJ SOLN
1.0000 mg | Freq: Once | INTRAMUSCULAR | Status: AC
  Administered 2023-07-30: 1 mg via INTRAVENOUS
  Filled 2023-07-30: qty 1

## 2023-07-30 MED ORDER — GADOBUTROL 1 MMOL/ML IV SOLN
7.0000 mL | Freq: Once | INTRAVENOUS | Status: AC | PRN
  Administered 2023-07-30: 7 mL via INTRAVENOUS

## 2023-07-30 MED ORDER — SODIUM CHLORIDE 0.9 % IV SOLN
Freq: Once | INTRAVENOUS | Status: AC
  Filled 2023-07-30: qty 500

## 2023-07-30 MED ORDER — HYDROMORPHONE HCL 1 MG/ML IJ SOLN
0.5000 mg | INTRAMUSCULAR | Status: DC | PRN
  Administered 2023-07-30 – 2023-07-31 (×8): 1 mg via INTRAVENOUS
  Filled 2023-07-30 (×8): qty 1

## 2023-07-30 MED ORDER — CYCLOBENZAPRINE HCL 5 MG PO TABS
7.5000 mg | ORAL_TABLET | Freq: Three times a day (TID) | ORAL | Status: DC | PRN
  Administered 2023-07-30 – 2023-08-03 (×12): 7.5 mg via ORAL
  Filled 2023-07-30 (×13): qty 1.5

## 2023-07-30 NOTE — Progress Notes (Signed)
PHARMACY CONSULT NOTE - FOLLOW UP  Pharmacy Consult for Electrolyte Monitoring and Replacement   Recent Labs: Potassium (mmol/L)  Date Value  07/30/2023 3.0 (L)   Magnesium (mg/dL)  Date Value  78/29/5621 2.0   Calcium (mg/dL)  Date Value  30/86/5784 7.6 (L)   Albumin (g/dL)  Date Value  69/62/9528 2.9 (L)   Phosphorus (mg/dL)  Date Value  41/32/4401 3.0   Sodium (mmol/L)  Date Value  07/30/2023 133 (L)    Assessment: 47 y.o. 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 Pharmacy is asked to follow and replace electrolytes  Goal of Therapy:  Electrolytes WNL  Plan:  9/28:  K @ 0202 = 3.0 - Will re-order KCl 50 mEq in NS 500 mL IV X 1  - Will recheck electrolytes on 9/29 with AM labs.   Scherrie Gerlach ,PharmD Clinical Pharmacist 07/30/2023 3:02 AM

## 2023-07-30 NOTE — Plan of Care (Signed)

## 2023-07-30 NOTE — Progress Notes (Addendum)
[facility]  - [place] at [facility]   PATIENT NAME: Sheryl Silva    MR#:  [id number]  DATE OF BIRTH:  06/21/1976  SUBJECTIVE:  Diarrhea improved.  Complaining of severe back pain and muscle spasms from neck down lumbar spine.  Heating pad did not help and she was just given 1 dose of Flexeril but still in significant distress VITALS:  Blood pressure (!) 141/80, pulse 94, temperature 98.2 F (36.8 C), temperature source Oral, resp. rate (!) 22, height 5' (1.524 m), weight 77.1 kg, SpO2 100%.  PHYSICAL EXAMINATION:   GENERAL:  47 y.o.-year-old patient with no acute distress.  LUNGS: Normal breath sounds bilaterally CARDIOVASCULAR: S1, S2 normal. No murmur   ABDOMEN: Soft, mild tenderness, nondistended. EXTREMITIES: No  edema b/l.    NEUROLOGIC: nonfocal  patient is alert and awake Spine: No focal tenderness but having generalized spasm and tenderness throughout the spine.  LABORATORY PANEL:  CBC Recent Labs  Lab 07/30/23 0202  WBC 2.9*  HGB 8.3*  HCT 24.1*  PLT 46*    Chemistries  Recent Labs  Lab 07/30/23 0202  NA 133*  K 3.0*  CL 102  CO2 21*  GLUCOSE 126*  BUN 15  CREATININE 0.56  CALCIUM 7.6*  MG 2.0    RADIOLOGY:  No results found.  Assessment and Plan  Sheryl Silva is a 47 y.o. 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  Patient was diagnosed with stage IV metastatic cancer initially was with on no arranging, pathology from axillary lymph node showed possible GI source.  Patient was started on chemo and radiation therapy with mFLFOX6 q. 14 days and so far she completed 7 cycles.  She said that with each cycle of chemotherapy she has had mild diarrhea but was never severe and or self-limiting.  Last chemotherapy and radiation was 9/16-17 Monday, patient started  develop severe cramping pain in the left lower quadrant for 2 days then started to have a severe diarrhea,   CTA negative for PE, extensive evidence of metastatic cancer involving the bones, lymph nodes.  Acute colitis versus diverticulitis descending and sigmoid colon.  Left adnexal pelvic mass.  Pelvic ultrasound showed heterogeneous hypervascular left adnexal mass probably metastatic in nature.   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  Will obtain MRI of the cervical and thoracolumbar spine Try K-pad and as needed Flexeril  Severe hypokalemia -- Pharmacy managing this aggressively and replating.  Potassium 3.0  Hyponatremia -- likely from diarrhea and poor PO intake.  Sodium 133 -- sodium improving.  Diarrhea slowing down  Cardiomegaly and small pericardial effusion -Stable status post 1 unit of PRBC transfusion on 9/27  Stage IV metastatic signet ring adenocarcinoma -- patient follows with Dr. Orlie Dakin -- patient getting chemotherapy. She finished radiation therapy  Chronic back pain  PRN PO and IV pain meds --Palliative care Josh managing pain meds.  Will increase Dilaudid frequency to every 3 hours from every 4 hours as she is very uncomfortable -Continue IV hydromorphone and p.o. Percocet, tramadol  H/o Buerger's dz Tobacco  use --stable  Pancytopenia Dr. Orlie Dakin is aware.  He wants to hold off on any further treatment for leukopenia as she had received fulphilia on 9/18. She is in the window where oncology is expecting her white count to start trending up. Since it's only been one week, it's probably too soon to give her GCSF per oncology Status post 2 platelet transfusion thus far.  9/26, 9/27.  Thrombocytopenia somewhat improved after transfusion.  Platelet of 46 today Status post 1 PRBC transfusion on 9/27 for hemoglobin of 7     Latest Ref Rng & Units 07/30/2023    2:02 AM 07/29/2023    5:24 AM 07/28/2023    4:22 AM  CBC  WBC 4.0 - 10.5 K/uL 2.9  1.8  1.1   Hemoglobin 12.0 - 15.0 g/dL 8.3  7.0  7.3   Hematocrit 36.0 - 46.0 % 24.1  19.8  20.5   Platelets 150 - 400 K/uL 46  11  11      Family communication : None at bedside Consults :GI, Oncology, ID, palliative care CODE STATUS: full code DVT Prophylaxis : No need as she is ambulatory.  She is thrombocytopenic cannot give any blood thinners Level of care: Telemetry Medical Status is: Inpatient Remains inpatient appropriate because: acute ischemic/CMP rectosigmoid colitis, still quite symptomatic and severe pancytopenia, hypokalemia   She is critically sick and high risk for cardiorespiratory failure, multiorgan failure and death.  Her overall prognosis remains poor.   TOTAL TIME TAKING CARE OF THIS PATIENT: 35 minutes.  >50% time spent on counselling and coordination of care  Note: This dictation was prepared with Dragon dictation along with smaller phrase technology. Any transcriptional errors that result from this process are unintentional.  Delfino Lovett M.D    [facility]   CC: Primary care physician; [name], MD

## 2023-07-30 NOTE — Progress Notes (Signed)
Date of Admission:  07/22/2023      ID: Sheryl Silva is a 47 y.o. female  Principal Problem:   Colitis Active Problems:   Adenocarcinoma (HCC)   Left sided colitis without complications (HCC)   Palliative care encounter    Subjective: Pt says diarrhea much better -only 2 stools today Her main complaint is back pain and cramps  Medications:   sodium chloride   Intravenous Once   busPIRone  5 mg Oral BID   Chlorhexidine Gluconate Cloth  6 each Topical Daily   cyanocobalamin  500 mcg Oral Daily   DULoxetine  60 mg Oral Daily   gabapentin  300 mg Oral TID   lactose free nutrition  237 mL Oral TID WC   megestrol  40 mg Oral Daily   multivitamin with minerals  1 tablet Oral Daily   pantoprazole  40 mg Oral Daily   [START ON 07/31/2023] predniSONE  30 mg Oral Q breakfast   Followed by   Melene Muller ON 08/01/2023] predniSONE  20 mg Oral Q breakfast   Followed by   Melene Muller ON 08/02/2023] predniSONE  10 mg Oral Q breakfast   sodium chloride flush  10-40 mL Intracatheter Q12H   Vitamin D (Ergocalciferol)  50,000 Units Oral Q7 days    Objective: Vital signs in last 24 hours: Patient Vitals for the past 24 hrs:  BP Temp Temp src Pulse Resp SpO2  07/30/23 1535 (!) 140/72 97.8 F (36.6 C) -- 93 20 100 %  07/30/23 0809 (!) 141/80 98.2 F (36.8 C) Oral 94 (!) 22 100 %  07/30/23 0356 116/75 98.7 F (37.1 C) Oral 91 16 100 %     PHYSICAL EXAM:  General: Alert, cooperative,   Head: Normocephalic, without obvious abnormality, atraumatic. Eyes: Conjunctivae clear, anicteric sclerae. Pupils are equal ENT Nares normal. No drainage or sinus tenderness. Lips, mucosa, and tongue normal. No Thrush Neck: Supple, symmetrical, no adenopathy, thyroid: non tender no carotid bruit and no JVD. Lungs: Clear to auscultation bilaterally. No Wheezing or Rhonchi. No rales. Heart: Regular rate and rhythm, no murmur, rub or gallop. Abdomen: Soft, minimal tenderness Extremities: atraumatic, no  cyanosis. No edema. No clubbing Skin: No rashes or lesions. Or bruising Lymph: Cervical, supraclavicular normal. Neurologic: Grossly non-focal  Lab Results    Latest Ref Rng & Units 07/30/2023    2:02 AM 07/29/2023    5:24 AM 07/28/2023    4:22 AM  CBC  WBC 4.0 - 10.5 K/uL 2.9  1.8  1.1   Hemoglobin 12.0 - 15.0 g/dL 8.3  7.0  7.3   Hematocrit 36.0 - 46.0 % 24.1  19.8  20.5   Platelets 150 - 400 K/uL 46  11  11        Latest Ref Rng & Units 07/30/2023    2:02 AM 07/29/2023    5:24 AM 07/28/2023    4:22 AM  CMP  Glucose 70 - 99 mg/dL 914  782  956   BUN 6 - 20 mg/dL 15  16  21    Creatinine 0.44 - 1.00 mg/dL 2.13  0.86  5.78   Sodium 135 - 145 mmol/L 133  133  133   Potassium 3.5 - 5.1 mmol/L 3.0  2.9  2.8   Chloride 98 - 111 mmol/L 102  100  101   CO2 22 - 32 mmol/L 21  23  22    Calcium 8.9 - 10.3 mg/dL 7.6  7.6  7.7  Microbiology: CMV PCR sent Studies/Results: No results found.   Assessment/Plan: Rectosigmoid colitis.  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  Sent CMV DNA PCR.  Marland Kitchen  Depending on her progress we may decide on p.o.valganciclovir after initial IV The duration of rx depends on her immune suppressive status.   patient has severe pancytopenia secondary to chemotherapy - it is improving  Received Fulphilia last week Getting PRBC   Metastatic signet cell adenocarcinoma On chemotherapy has received 7 cycles of FOLFOX and Pembroluzimab     Discontinued probiotics as risk for translocation and causing  lactobacillus bacteremia is increased     Discussed the management with the patient and great detail Discussed with care team ID will follow her peripherally tomorrow

## 2023-07-31 DIAGNOSIS — K529 Noninfective gastroenteritis and colitis, unspecified: Secondary | ICD-10-CM | POA: Diagnosis not present

## 2023-07-31 DIAGNOSIS — C801 Malignant (primary) neoplasm, unspecified: Secondary | ICD-10-CM | POA: Diagnosis not present

## 2023-07-31 DIAGNOSIS — A0839 Other viral enteritis: Secondary | ICD-10-CM

## 2023-07-31 DIAGNOSIS — Z515 Encounter for palliative care: Secondary | ICD-10-CM | POA: Diagnosis not present

## 2023-07-31 DIAGNOSIS — B259 Cytomegaloviral disease, unspecified: Secondary | ICD-10-CM

## 2023-07-31 LAB — CBC
HCT: 25.1 % — ABNORMAL LOW (ref 36.0–46.0)
Hemoglobin: 8.5 g/dL — ABNORMAL LOW (ref 12.0–15.0)
MCH: 30.8 pg (ref 26.0–34.0)
MCHC: 33.9 g/dL (ref 30.0–36.0)
MCV: 90.9 fL (ref 80.0–100.0)
Platelets: 31 10*3/uL — ABNORMAL LOW (ref 150–400)
RBC: 2.76 MIL/uL — ABNORMAL LOW (ref 3.87–5.11)
RDW: 15.6 % — ABNORMAL HIGH (ref 11.5–15.5)
WBC: 4.6 10*3/uL (ref 4.0–10.5)
nRBC: 2.2 % — ABNORMAL HIGH (ref 0.0–0.2)

## 2023-07-31 LAB — COMPREHENSIVE METABOLIC PANEL
ALT: 99 U/L — ABNORMAL HIGH (ref 0–44)
AST: 61 U/L — ABNORMAL HIGH (ref 15–41)
Albumin: 2.4 g/dL — ABNORMAL LOW (ref 3.5–5.0)
Alkaline Phosphatase: 173 U/L — ABNORMAL HIGH (ref 38–126)
Anion gap: 5 (ref 5–15)
BUN: 16 mg/dL (ref 6–20)
CO2: 24 mmol/L (ref 22–32)
Calcium: 7.8 mg/dL — ABNORMAL LOW (ref 8.9–10.3)
Chloride: 104 mmol/L (ref 98–111)
Creatinine, Ser: 0.63 mg/dL (ref 0.44–1.00)
GFR, Estimated: >60 mL/min (ref >60)
Glucose, Bld: 125 mg/dL — ABNORMAL HIGH (ref 70–99)
Potassium: 3.3 mmol/L — ABNORMAL LOW (ref 3.5–5.1)
Sodium: 133 mmol/L — ABNORMAL LOW (ref 135–145)
Total Bilirubin: 0.6 mg/dL (ref 0.3–1.2)
Total Protein: 4.7 g/dL — ABNORMAL LOW (ref 6.5–8.1)

## 2023-07-31 MED ORDER — POTASSIUM CHLORIDE CRYS ER 20 MEQ PO TBCR
40.0000 meq | EXTENDED_RELEASE_TABLET | Freq: Two times a day (BID) | ORAL | Status: AC
  Administered 2023-07-31 (×2): 40 meq via ORAL
  Filled 2023-07-31 (×2): qty 2

## 2023-07-31 MED ORDER — HYDROMORPHONE HCL 1 MG/ML IJ SOLN
1.0000 mg | INTRAMUSCULAR | Status: DC | PRN
  Administered 2023-07-31 (×2): 2 mg via INTRAVENOUS
  Administered 2023-07-31: 1 mg via INTRAVENOUS
  Administered 2023-08-01 – 2023-08-02 (×14): 2 mg via INTRAVENOUS
  Filled 2023-07-31 (×17): qty 2

## 2023-07-31 NOTE — Plan of Care (Signed)

## 2023-07-31 NOTE — Progress Notes (Signed)
[facility]  - [place] [facility]   PATIENT NAME: Sheryl Silva    MR#:  [id number]  DATE OF BIRTH:  Jul 08, 1976  SUBJECTIVE:  Diarrhea improved.  Flexeril help with her muscle spasm in the back.  Still having significant pain and requesting to increase her pain medication.  Boyfriend at bedside VITALS:  Blood pressure 107/73, pulse (!) 109, temperature 98.5 F (36.9 C), temperature source Oral, resp. rate 18, height 5' (1.524 m), weight 77.1 kg, SpO2 100%.  PHYSICAL EXAMINATION:   GENERAL:  47 y.o.-year-old patient with no acute distress.  LUNGS: Normal breath sounds bilaterally CARDIOVASCULAR: S1, S2 normal. No murmur   ABDOMEN: Soft, mild tenderness, nondistended. EXTREMITIES: No  edema b/l.    NEUROLOGIC: nonfocal  patient is alert and awake Spine: No focal tenderness but having generalized tenderness throughout the spine.  LABORATORY PANEL:  CBC Recent Labs  Lab 07/31/23 0341  WBC 4.6  HGB 8.5*  HCT 25.1*  PLT 31*    Chemistries  Recent Labs  Lab 07/30/23 0202 07/31/23 0341  NA 133* 133*  K 3.0* 3.3*  CL 102 104  CO2 21* 24  GLUCOSE 126* 125*  BUN 15 16  CREATININE 0.56 0.63  CALCIUM 7.6* 7.8*  MG 2.0  --   AST  --  61*  ALT  --  99*  ALKPHOS  --  173*  BILITOT  --  0.6    RADIOLOGY:  MR THORACIC SPINE W WO CONTRAST  Result Date: 07/30/2023 CLINICAL DATA:  Metastatic carcinoma of gastrointestinal primary. EXAM: MRI THORACIC WITHOUT AND WITH CONTRAST TECHNIQUE: Multiplanar and multiecho pulse sequences of the thoracic spine were obtained without and with intravenous contrast. CONTRAST:  7mL GADAVIST GADOBUTROL 1 MMOL/ML IV SOLN COMPARISON:  None Available. FINDINGS: Alignment:  Normal Vertebrae: Widespread metastatic disease throughout the marrow spaces of every vertebral body level with involvement of the vertebral bodies and posterior elements. No pathologic fracture is seen. No evidence of epidural tumor. No evidence of metastatic  disease to the cord. No foraminal disease identified in the thoracic region. Cord:  No evidence of metastatic disease to the cord is self. Paraspinal and other soft tissues: Posterior rib involvement is present as well. Disc levels: Disc degeneration and bulging at T7-8. Narrowing of the ventral subarachnoid space but no compression of the cord. IMPRESSION: 1. Widespread metastatic disease throughout the marrow spaces of every vertebral body level with involvement of the vertebral bodies and posterior elements. No pathologic fracture. No evidence of epidural tumor. No evidence of metastatic disease to the cord or neural foramina. 2. Disc degeneration and bulging at T7-8 with narrowing of the ventral subarachnoid space but no compression of the cord. Electronically Signed   By: Paulina Fusi M.D.   On: 07/30/2023 21:16   MR Lumbar Spine W Wo Contrast  Result Date: 07/30/2023 CLINICAL DATA:  Metastatic adenocarcinoma of gastrointestinal source. EXAM: MRI LUMBAR SPINE WITHOUT AND WITH CONTRAST TECHNIQUE: Multiplanar and multiecho pulse sequences of the lumbar spine were obtained without and with intravenous contrast. CONTRAST:  7mL GADAVIST GADOBUTROL 1 MMOL/ML IV SOLN COMPARISON:  04/17/2022 FINDINGS: Segmentation:  5 lumbar type vertebral bodies. Alignment:  Normal Vertebrae: Metastatic disease throughout the marrow spaces with involvement of every vertebral level and the sacrum. Iliac bones are also diffusely involved. The spinal involvement includes vertebral body involvement and posterior element involvement. No evidence of a pathologic fracture. Some extraosseous extension of tumor along the left side of the L2 vertebral body. Early tumor  involvement of the intervertebral foramina on the left at L1-2 and L2-3. Conus medullaris and cauda equina: Conus extends to the L1 level. No evidence of metastatic disease to the distal cord, conus tip or nerve roots. Paraspinal and other soft tissues: Otherwise negative  Disc levels: No significant disc level pathology. IMPRESSION: 1. Widespread osseous metastatic disease throughout the spine, sacrum and iliac bones. No evidence of pathologic fracture. Some extraosseous extension of tumor along the left side of the L2 vertebral body. Early tumor involvement of the intervertebral foramina on the left at L1-2 and L2-3. 2. No evidence of metastatic disease to the distal cord, conus tip or nerve roots. Electronically Signed   By: Paulina Fusi M.D.   On: 07/30/2023 21:13   MR CERVICAL SPINE W WO CONTRAST  Result Date: 07/30/2023 CLINICAL DATA:  Metastatic disease evaluation. 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 EXAM: MRI CERVICAL SPINE WITHOUT AND WITH CONTRAST TECHNIQUE: Multiplanar and multiecho pulse sequences of the cervical spine, to include the craniocervical junction and cervicothoracic junction, were obtained without and with intravenous contrast. CONTRAST:  7mL GADAVIST GADOBUTROL 1 MMOL/ML IV SOLN COMPARISON:  04/04/2022 FINDINGS: Alignment: Straightening of the normal cervical lordosis. Vertebrae and spinal cord: Metastatic disease throughout the region affecting all of the vertebral bodies, the clivus and the posterior elements. No evidence of extraosseous tumor causing neural encroachment. No metastatic disease to the spinal cord itself. Posterior Fossa, vertebral arteries, paraspinal tissues: Enlarged cervical lymph nodes on the left. Disc levels: Mild bulging of the disc. Involution of a previously seen herniation at C4-5. No compressive stenosis of the canal or foramina due to disc disease. IMPRESSION: 1. Widespread osseous metastatic disease throughout the cervical spine, the clivus and the posterior elements. Involvement of every vertebral level. No evidence of extraosseous tumor causing neural encroachment. No evidence of metastatic disease to the spinal cord itself. 2. Enlarged cervical lymph nodes on the left consistent with metastatic disease. 3.  Involution of a previously seen disc herniation at C4-5. No compressive stenosis of the canal or foramina due to degenerative disease. Electronically Signed   By: Paulina Fusi M.D.   On: 07/30/2023 21:09    Assessment and Plan  Sheryl Silva is a 47 y.o. 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  Patient was diagnosed with stage IV metastatic cancer initially was with on no arranging, pathology from axillary lymph node showed possible GI source.  Patient was started on chemo and radiation therapy with [id number] q. 14 days and so far she completed 7 cycles.  She said that with each cycle of chemotherapy she has had mild diarrhea but was never severe and or self-limiting.  Last chemotherapy and radiation was 9/16-17 Monday, patient started develop severe cramping pain in the left lower quadrant for 2 days then started to have a severe diarrhea,   CTA negative for PE, extensive evidence of metastatic cancer involving the bones, lymph nodes.  Acute colitis versus diverticulitis descending and sigmoid colon.  Left adnexal pelvic mass.  Pelvic ultrasound showed heterogeneous hypervascular left adnexal mass probably metastatic in nature.   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  Dose  increased to 1 to 2 mg every 3 hours for better pain control.  I have discussed with her pros and cons of stronger pain medications including possible respiratory depression -- PRN Imodium for diarrhea --BC negative --stool studies negative -- Continue prednisone taper, started on 9/26 -Her symptoms are  improving on steroid suggestive of possible immune checkpoint inhibitor induced immune colitis -ID following, continue IV ganciclovir with possible transition to oral Valganciclovir Diarrhea has improved  Severe back pain/spasm Unsure if there is any metastasis in the spine.  MRI of the cervical and thoracolumbar spine shows metastasis all over the spine but not in the spinal cord.  This is not new according to patient and per oncology  Trial of K-pad and as needed Flexeril  Severe hypokalemia -- Pharmacy managing this aggressively and replating.  Potassium 3.3  Hyponatremia -- likely from diarrhea and poor PO intake.  Sodium 133 -- sodium improving.  Diarrhea slowing down  Cardiomegaly and small pericardial effusion -Stable status post 1 unit of PRBC transfusion on 9/27  Stage IV metastatic signet ring adenocarcinoma -- patient follows with Dr. Orlie Dakin -- patient getting chemotherapy. She finished radiation therapy  Chronic back pain PRN PO and IV pain meds --Palliative care Josh managing pain meds.  Will increase Dilaudid frequency to every 3 hours from every 4 hours as she is very uncomfortable -Continue IV hydromorphone and p.o. Percocet, tramadol  H/o Buerger's dz Tobacco use --stable  Pancytopenia  leukopenia is improved -likely response to fulphilia given on 9/18.  Status post 2 platelet transfusion thus far.  9/26, 9/27.  Thrombocytopenia somewhat improved after transfusion.   Status post 1 PRBC transfusion on 9/27 for hemoglobin of 7     Latest Ref Rng & Units 07/31/2023    3:41 AM 07/30/2023    2:02 AM 07/29/2023    5:24 AM  CBC  WBC 4.0 - 10.5 K/uL 4.6  2.9  1.8   Hemoglobin 12.0 - 15.0 g/dL 8.5  8.3  7.0   Hematocrit 36.0 - 46.0 % 25.1  24.1  19.8   Platelets 150 - 400 K/uL 31  46  11      Family communication : Boyfriend updated at bedside Consults :GI, Oncology, ID, palliative care CODE STATUS: full code DVT Prophylaxis : SCDs, she is thrombocytopenic  cannot give any blood thinners Level of care: Telemetry Medical Status is: Inpatient Remains inpatient appropriate because: acute ischemic/CMP rectosigmoid colitis, still quite symptomatic-pain management and severe pancytopenia, hypokalemia   She is critically sick and high risk for cardiorespiratory failure, multiorgan failure and death.  Her overall prognosis remains poor.   TOTAL TIME TAKING CARE OF THIS PATIENT: 35 minutes.  >50% time spent on counselling and coordination of care  Note: This dictation was prepared with Dragon dictation along with smaller phrase technology. Any transcriptional errors that result from this process are unintentional.  Delfino Lovett M.D    [facility]   CC: Primary care physician; [name], MD

## 2023-07-31 NOTE — Progress Notes (Signed)
PHARMACY CONSULT NOTE  Pharmacy Consult for Electrolyte Monitoring and Replacement   Recent Labs: Potassium (mmol/L)  Date Value  07/31/2023 3.3 (L)   Magnesium (mg/dL)  Date Value  16/08/9603 2.0   Calcium (mg/dL)  Date Value  54/07/8118 7.8 (L)   Albumin (g/dL)  Date Value  14/78/2956 2.4 (L)   Phosphorus (mg/dL)  Date Value  21/30/8657 3.0   Sodium (mmol/L)  Date Value  07/31/2023 133 (L)   Assessment: 47 y.o. 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 Pharmacy is asked to follow and replace electrolytes  Goal of Therapy:  Electrolytes WNL  Plan:  --K 3.3, Kcl 40 mEq PO BID x 2 doses --Follow-up BMP with AM labs tomorrow  Tressie Ellis 07/31/2023 7:23 AM

## 2023-08-01 DIAGNOSIS — K5150 Left sided colitis without complications: Secondary | ICD-10-CM | POA: Diagnosis not present

## 2023-08-01 DIAGNOSIS — K529 Noninfective gastroenteritis and colitis, unspecified: Secondary | ICD-10-CM | POA: Diagnosis not present

## 2023-08-01 DIAGNOSIS — Z515 Encounter for palliative care: Secondary | ICD-10-CM | POA: Diagnosis not present

## 2023-08-01 DIAGNOSIS — C801 Malignant (primary) neoplasm, unspecified: Secondary | ICD-10-CM | POA: Diagnosis not present

## 2023-08-01 DIAGNOSIS — A0839 Other viral enteritis: Secondary | ICD-10-CM | POA: Diagnosis not present

## 2023-08-01 LAB — BASIC METABOLIC PANEL
Anion gap: 8 (ref 5–15)
BUN: 18 mg/dL (ref 6–20)
CO2: 23 mmol/L (ref 22–32)
Calcium: 7.8 mg/dL — ABNORMAL LOW (ref 8.9–10.3)
Chloride: 101 mmol/L (ref 98–111)
Creatinine, Ser: 0.63 mg/dL (ref 0.44–1.00)
GFR, Estimated: >60 mL/min (ref >60)
Glucose, Bld: 128 mg/dL — ABNORMAL HIGH (ref 70–99)
Potassium: 4 mmol/L (ref 3.5–5.1)
Sodium: 132 mmol/L — ABNORMAL LOW (ref 135–145)

## 2023-08-01 LAB — CMV DNA, QUANTITATIVE, PCR
CMV DNA Quant: POSITIVE [IU]/mL
Log10 CMV Qn DNA Pl: UNDETERMINED {Log}

## 2023-08-01 LAB — CBC
HCT: 27.1 % — ABNORMAL LOW (ref 36.0–46.0)
Hemoglobin: 8.9 g/dL — ABNORMAL LOW (ref 12.0–15.0)
MCH: 30.8 pg (ref 26.0–34.0)
MCHC: 32.8 g/dL (ref 30.0–36.0)
MCV: 93.8 fL (ref 80.0–100.0)
Platelets: 36 10*3/uL — ABNORMAL LOW (ref 150–400)
RBC: 2.89 MIL/uL — ABNORMAL LOW (ref 3.87–5.11)
RDW: 16.8 % — ABNORMAL HIGH (ref 11.5–15.5)
WBC: 3.5 10*3/uL — ABNORMAL LOW (ref 4.0–10.5)
nRBC: 3.5 % — ABNORMAL HIGH (ref 0.0–0.2)

## 2023-08-01 MED ORDER — FENTANYL 50 MCG/HR TD PT72
1.0000 | MEDICATED_PATCH | TRANSDERMAL | Status: DC
  Administered 2023-08-01: 1 via TRANSDERMAL
  Filled 2023-08-01: qty 1

## 2023-08-01 MED ORDER — ENSURE ENLIVE PO LIQD
237.0000 mL | Freq: Three times a day (TID) | ORAL | Status: DC
  Administered 2023-08-02 – 2023-08-03 (×4): 237 mL via ORAL

## 2023-08-01 NOTE — Progress Notes (Signed)
Initial Nutrition Assessment  DOCUMENTATION CODES:   Not applicable  INTERVENTION:   Ensure Enlive po TID, each supplement provides 350 kcal and 20 grams of protein.  Magic cup TID with meals, each supplement provides 290 kcal and 9 grams of protein  MVI po daily   Pt at high refeed risk; recommend monitor potassium, magnesium and phosphorus labs daily until stable  Daily weights   NUTRITION DIAGNOSIS:   Inadequate oral intake related to acute illness as evidenced by per patient/family report.  GOAL:   Patient will meet greater than or equal to 90% of their needs  MONITOR:   PO intake, Supplement acceptance, Labs, Weight trends, I & O's, Skin  REASON FOR ASSESSMENT:   Malnutrition Screening Tool    ASSESSMENT:   47 y/o female with h/o buerger's disease, DDD, lumbar radiculopathy, anxiety, HTN, depression and Stage IV signet ring cell carcinoma, metastatic currently on chemotherapy who is admitted with colitis and sepsis.  Met with pt in room today. Pt reports decreased appetite and oral intake for several days pta r/t emesis and diarrhea. Pt reports that her appetite has been fair in hospital; pt documented to be eating anywhere from 30-100% of meals. Pt reports that she has been eating mainly soups and fruit. Pt has been drinking vanilla Ensure and Boost Breeze at home and in hospital. Pt endorses some weight loss pta. Per chart, pt appears to be down 14lbs(8%) over the past 3 months; this is significant weight loss. Pt has not been weighed since admission; will request daily weights. RD will add supplements and MVI to help pt meet her estimated needs. RD will also liberalize pt's diet. Pt is likely at high refeed risk. Pt is followed by the RD at the [facility].   Medications reviewed and include: B12, megace, MVI, protonix, prednisone, vitamin D  Labs reviewed: Na 132(L), K 4.0 wnl Wbc- 3.5(L), Hgb 8.9(L), Hct 27.1(L)  NUTRITION - FOCUSED PHYSICAL  EXAM:  Flowsheet Row Most Recent Value  Orbital Region No depletion  Upper Arm Region No depletion  Thoracic and Lumbar Region No depletion  Buccal Region No depletion  Temple Region No depletion  Clavicle Bone Region No depletion  Clavicle and Acromion Bone Region No depletion  Scapular Bone Region No depletion  Dorsal Hand No depletion  Patellar Region Mild depletion  Anterior Thigh Region Mild depletion  Posterior Calf Region Mild depletion  Edema (RD Assessment) None  Hair Reviewed  Eyes Reviewed  Mouth Reviewed  Skin Reviewed  Nails Reviewed   Diet Order:   Diet Order             Diet regular Room service appropriate? Yes; Fluid consistency: Thin  Diet effective now                  EDUCATION NEEDS:   Education needs have been addressed  Skin:  Skin Assessment: Reviewed RN Assessment  Last BM:  9/28- type 7  Height:   Ht Readings from Last 1 Encounters:  08/01/23 5\' 6"  (1.676 m)    Weight:   Wt Readings from Last 1 Encounters:  07/26/23 77.1 kg    Ideal Body Weight:  59 kg  BMI:  Body mass index is 27.44 kg/m.  Estimated Nutritional Needs:   Kcal:  1900-2200kcal/day  Protein:  95-110g/day  Fluid:  1.8-2.1L/day  Betsey Holiday MS, RD, LDN Please refer to [facility] for RD and/or RD on-call/weekend/after hours pager

## 2023-08-01 NOTE — Plan of Care (Signed)

## 2023-08-01 NOTE — Progress Notes (Signed)
[facility] [phone] [phone]   Name: Tearza South Date: 08/01/2023 MRN: [id number]  DOB: 1976/09/27  Patient Care Team: [name], MD as PCP - General (Internal Medicine) Jim Like, RN as Registered Nurse Scarlett Presto, RN (Inactive) as Registered Nurse Benita Gutter, RN as Oncology Nurse Navigator Jeralyn Ruths, MD as Consulting Physician (Oncology)    REASON FOR CONSULTATION: Delorean Olwell is a 47 y.o. 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  Patient has been on regimen of FOLFOX plus Keytruda.  She was admitted to the hospital 07/22/2023 with abdominal pain.  CT of the abdomen and pelvis showed progressive mediastinal, retroperitoneal, and pelvic lymphadenopathy, diffuse osteoblastic metastatic disease, and left adnexal pelvic mass.  Patient also with findings of colitis.  Palliative care was consulted to address goals and manage ongoing symptoms.   CODE STATUS: Full code  PAST MEDICAL HISTORY: Past Medical History:  Diagnosis Date   Anxiety    Cancer (HCC)    Depression    Heart murmur    Hypertension     PAST SURGICAL HISTORY:  Past Surgical History:  Procedure Laterality Date   BIOPSY  07/26/2023   Procedure: BIOPSY;  Surgeon: Toney Reil, MD;  Location: [facility] ENDOSCOPY;  Service: Gastroenterology;;   CESAREAN SECTION     COLONOSCOPY WITH PROPOFOL N/A 02/21/2023   Procedure: COLONOSCOPY WITH PROPOFOL;  Surgeon: Regis Bill, MD;  Location: [facility] ENDOSCOPY;  Service: Endoscopy;  Laterality: N/A;   DILATION AND CURETTAGE OF UTERUS     FLEXIBLE SIGMOIDOSCOPY N/A 07/26/2023   Procedure: FLEXIBLE SIGMOIDOSCOPY;  Surgeon: Toney Reil, MD;  Location: [facility] ENDOSCOPY;  Service: Gastroenterology;  Laterality: N/A;   IR IMAGING GUIDED PORT INSERTION  02/16/2023     HEMATOLOGY/ONCOLOGY HISTORY:  Oncology History  Signet ring cell adenocarcinoma (HCC)  02/07/2023 Cancer Staging   Staging form: Exocrine Pancreas, AJCC 8th Edition - Clinical stage from 02/07/2023: Stage IV (cTX, cNX, pM1) - Signed by Jeralyn Ruths, MD on 02/14/2023 Stage prefix: Initial diagnosis   02/14/2023 Initial Diagnosis   Signet ring cell adenocarcinoma   03/02/2023 -  Chemotherapy   Patient is on Treatment Plan : GI ORIGIN FOLFOX+KEYTRUDA q21d x12 followed by Gwenlyn Fudge       ALLERGIES:  is allergic to nifedipine.  MEDICATIONS:  Current Facility-Administered Medications  Medication Dose Route Frequency Provider Last Rate Last Admin   0.9 %  sodium chloride infusion   Intravenous PRN Enedina Finner, MD   Stopped at 07/30/23 0319   acetaminophen (TYLENOL) tablet 650 mg  650 mg Oral Q6H PRN Mikey College T, MD   650 mg at 08/01/23 0328   albuterol (PROVENTIL) (2.5 MG/3ML) 0.083% nebulizer solution 2.5 mg  2.5 mg Nebulization Q4H PRN Manuela Schwartz, NP   2.5 mg at 07/24/23 2001   busPIRone (BUSPAR) tablet 5 mg  5 mg Oral BID Enedina Finner, MD   5 mg at 08/01/23 8469   Chlorhexidine Gluconate Cloth 2 % PADS 6 each  6 each Topical Daily Jeralyn Ruths, MD   6 each at 08/01/23 1300   cyanocobalamin (VITAMIN B12) tablet 500 mcg  500 mcg Oral Daily Enedina Finner, MD   500 mcg at 08/01/23 6295   cyclobenzaprine (FLEXERIL) tablet 7.5 mg  7.5 mg Oral TID PRN Delfino Lovett, MD   7.5 mg at 08/01/23 1258   dicyclomine (BENTYL) tablet 20  mg  20 mg Oral TID PRN Toney Reil, MD   20 mg at 07/31/23 0908   DULoxetine (CYMBALTA) DR capsule 60 mg  60 mg Oral Daily Enedina Finner, MD   60 mg at 08/01/23 0824   feeding supplement (ENSURE ENLIVE / ENSURE PLUS) liquid 237 mL  237 mL Oral TID BM Delfino Lovett, MD       fentaNYL (DURAGESIC) 50 MCG/HR 1 patch  1 patch Transdermal Q72H Lothar Prehn, Daryl Eastern, NP       gabapentin (NEURONTIN) capsule 300 mg  300 mg Oral TID Mikey College T, MD   300  mg at 08/01/23 1421   ganciclovir (CYTOVENE) 385 mg in sodium chloride 0.9 % 100 mL IVPB  5 mg/kg Intravenous BID Lynn Ito, MD 100 mL/hr at 08/01/23 1128 385 mg at 08/01/23 1128   HYDROmorphone (DILAUDID) injection 1-2 mg  1-2 mg Intravenous Q3H PRN Delfino Lovett, MD   2 mg at 08/01/23 1421   loperamide (IMODIUM) capsule 2 mg  2 mg Oral Q6H PRN Delfino Lovett, MD   2 mg at 07/29/23 2133   megestrol (MEGACE) tablet 40 mg  40 mg Oral Daily Mikey College T, MD   40 mg at 08/01/23 1124   multivitamin with minerals tablet 1 tablet  1 tablet Oral Daily Enedina Finner, MD   1 tablet at 08/01/23 0821   ondansetron [facility]) injection 4 mg  4 mg Intravenous Q6H PRN Mikey College T, MD   4 mg at 07/30/23 0958   oxyCODONE-acetaminophen (PERCOCET/ROXICET) 5-325 MG per tablet 1-2 tablet  1-2 tablet Oral Q4H PRN Enedina Finner, MD   2 tablet at 07/31/23 9381   And   oxyCODONE (Oxy IR/ROXICODONE) immediate release tablet 5 mg  5 mg Oral Q4H PRN Enedina Finner, MD   5 mg at 07/28/23 0429   pantoprazole (PROTONIX) EC tablet 40 mg  40 mg Oral Daily Mikey College T, MD   40 mg at 08/01/23 8299   [START ON 08/02/2023] predniSONE (DELTASONE) tablet 10 mg  10 mg Oral Q breakfast Delfino Lovett, MD       sodium chloride flush (NS) 0.9 % injection 10-40 mL  10-40 mL Intracatheter Q12H Jeralyn Ruths, MD   10 mL at 08/01/23 3716   sodium chloride flush (NS) 0.9 % injection 10-40 mL  10-40 mL Intracatheter PRN Jeralyn Ruths, MD       traMADol Janean Sark) tablet 50 mg  50 mg Oral Q12H PRN Enedina Finner, MD   50 mg at 07/25/23 0250   traZODone (DESYREL) tablet 25 mg  25 mg Oral QHS PRN Mansy, Jan A, MD   25 mg at 07/30/23 2218   Vitamin D (Ergocalciferol) (DRISDOL) 1.25 MG (50000 UNIT) capsule 50,000 Units  50,000 Units Oral Q7 days Enedina Finner, MD   50,000 Units at 07/31/23 0908   Facility-Administered Medications Ordered in Other Encounters  Medication Dose Route Frequency Provider Last Rate Last Admin   sodium chloride flush  (NS) 0.9 % injection 10 mL  10 mL Intracatheter PRN Jeralyn Ruths, MD   10 mL at 06/29/23 1430    VITAL SIGNS: BP 131/83 (BP Location: Right Arm)   Pulse 98   Temp 98.5 F (36.9 C)   Resp 16   Ht 5\' 6"  (1.676 m)   Wt 170 lb (77.1 kg)   LMP  (LMP Unknown)   SpO2 100%   BMI 27.44 kg/m  Filed Weights   07/26/23 1547  Weight: 170 lb (  77.1 kg)    Estimated body mass index is 27.44 kg/m as calculated from the following:   Height as of this encounter: 5\' 6"  (1.676 m).   Weight as of this encounter: 170 lb (77.1 kg).  LABS: CBC:    Component Value Date/Time   WBC 3.5 (L) 08/01/2023 0329   HGB 8.9 (L) 08/01/2023 0329   HGB 9.4 (L) 07/19/2023 1432   HCT 27.1 (L) 08/01/2023 0329   PLT 36 (L) 08/01/2023 0329   PLT 97 (L) 07/19/2023 1432   MCV 93.8 08/01/2023 0329   NEUTROABS 3.9 07/22/2023 0225   LYMPHSABS 0.4 (L) 07/22/2023 0225   MONOABS 0.1 07/22/2023 0225   EOSABS 0.1 07/22/2023 0225   BASOSABS 0.0 07/22/2023 0225   Comprehensive Metabolic Panel:    Component Value Date/Time   NA 132 (L) 08/01/2023 0329   K 4.0 08/01/2023 0329   CL 101 08/01/2023 0329   CO2 23 08/01/2023 0329   BUN 18 08/01/2023 0329   CREATININE 0.63 08/01/2023 0329   CREATININE 0.65 07/18/2023 0829   GLUCOSE 128 (H) 08/01/2023 0329   CALCIUM 7.8 (L) 08/01/2023 0329   AST 61 (H) 07/31/2023 0341   AST 106 (H) 07/18/2023 0829   ALT 99 (H) 07/31/2023 0341   ALT 113 (H) 07/18/2023 0829   ALKPHOS 173 (H) 07/31/2023 0341   BILITOT 0.6 07/31/2023 0341   BILITOT 0.5 07/18/2023 0829   PROT 4.7 (L) 07/31/2023 0341   ALBUMIN 2.4 (L) 07/31/2023 0341    RADIOGRAPHIC STUDIES: MR THORACIC SPINE W WO CONTRAST  Result Date: 07/30/2023 CLINICAL DATA:  Metastatic carcinoma of gastrointestinal primary. EXAM: MRI THORACIC WITHOUT AND WITH CONTRAST TECHNIQUE: Multiplanar and multiecho pulse sequences of the thoracic spine were obtained without and with intravenous contrast. CONTRAST:  7mL GADAVIST  GADOBUTROL 1 MMOL/ML IV SOLN COMPARISON:  None Available. FINDINGS: Alignment:  Normal Vertebrae: Widespread metastatic disease throughout the marrow spaces of every vertebral body level with involvement of the vertebral bodies and posterior elements. No pathologic fracture is seen. No evidence of epidural tumor. No evidence of metastatic disease to the cord. No foraminal disease identified in the thoracic region. Cord:  No evidence of metastatic disease to the cord is self. Paraspinal and other soft tissues: Posterior rib involvement is present as well. Disc levels: Disc degeneration and bulging at T7-8. Narrowing of the ventral subarachnoid space but no compression of the cord. IMPRESSION: 1. Widespread metastatic disease throughout the marrow spaces of every vertebral body level with involvement of the vertebral bodies and posterior elements. No pathologic fracture. No evidence of epidural tumor. No evidence of metastatic disease to the cord or neural foramina. 2. Disc degeneration and bulging at T7-8 with narrowing of the ventral subarachnoid space but no compression of the cord. Electronically Signed   By: Paulina Fusi M.D.   On: 07/30/2023 21:16   MR Lumbar Spine W Wo Contrast  Result Date: 07/30/2023 CLINICAL DATA:  Metastatic adenocarcinoma of gastrointestinal source. EXAM: MRI LUMBAR SPINE WITHOUT AND WITH CONTRAST TECHNIQUE: Multiplanar and multiecho pulse sequences of the lumbar spine were obtained without and with intravenous contrast. CONTRAST:  7mL GADAVIST GADOBUTROL 1 MMOL/ML IV SOLN COMPARISON:  04/17/2022 FINDINGS: Segmentation:  5 lumbar type vertebral bodies. Alignment:  Normal Vertebrae: Metastatic disease throughout the marrow spaces with involvement of every vertebral level and the sacrum. Iliac bones are also diffusely involved. The spinal involvement includes vertebral body involvement and posterior element involvement. No evidence of a pathologic fracture. Some extraosseous extension of  tumor along the left side of the L2 vertebral body. Early tumor involvement of the intervertebral foramina on the left at L1-2 and L2-3. Conus medullaris and cauda equina: Conus extends to the L1 level. No evidence of metastatic disease to the distal cord, conus tip or nerve roots. Paraspinal and other soft tissues: Otherwise negative Disc levels: No significant disc level pathology. IMPRESSION: 1. Widespread osseous metastatic disease throughout the spine, sacrum and iliac bones. No evidence of pathologic fracture. Some extraosseous extension of tumor along the left side of the L2 vertebral body. Early tumor involvement of the intervertebral foramina on the left at L1-2 and L2-3. 2. No evidence of metastatic disease to the distal cord, conus tip or nerve roots. Electronically Signed   By: Paulina Fusi M.D.   On: 07/30/2023 21:13   MR CERVICAL SPINE W WO CONTRAST  Result Date: 07/30/2023 CLINICAL DATA:  Metastatic disease evaluation. 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 EXAM: MRI CERVICAL SPINE WITHOUT AND WITH CONTRAST TECHNIQUE: Multiplanar and multiecho pulse sequences of the cervical spine, to include the craniocervical junction and cervicothoracic junction, were obtained without and with intravenous contrast. CONTRAST:  7mL GADAVIST GADOBUTROL 1 MMOL/ML IV SOLN COMPARISON:  04/04/2022 FINDINGS: Alignment: Straightening of the normal cervical lordosis. Vertebrae and spinal cord: Metastatic disease throughout the region affecting all of the vertebral bodies, the clivus and the posterior elements. No evidence of extraosseous tumor causing neural encroachment. No metastatic disease to the spinal cord itself. Posterior Fossa, vertebral arteries, paraspinal tissues: Enlarged cervical lymph nodes on the left. Disc levels: Mild bulging of the disc. Involution of a previously seen herniation at C4-5. No compressive stenosis of the canal or foramina due to disc disease. IMPRESSION: 1. Widespread  osseous metastatic disease throughout the cervical spine, the clivus and the posterior elements. Involvement of every vertebral level. No evidence of extraosseous tumor causing neural encroachment. No evidence of metastatic disease to the spinal cord itself. 2. Enlarged cervical lymph nodes on the left consistent with metastatic disease. 3. Involution of a previously seen disc herniation at C4-5. No compressive stenosis of the canal or foramina due to degenerative disease. Electronically Signed   By: Paulina Fusi M.D.   On: 07/30/2023 21:09   US PELVIC COMPLETE W TRANSVAGINAL AND TORSION R/O  Result Date: 07/22/2023 CLINICAL DATA:  [id number] with pelvic pain for 5 days. History of metastatic adrenal carcinoma and steadily enlarging left adnexal/pelvic mass on CT with a normal left ovary not able to be distinguished from it. There was also evidence of distal descending and proximal to mid sigmoid colitis/diverticulitis on CT today. EXAM: TRANSABDOMINAL AND TRANSVAGINAL ULTRASOUND OF PELVIS DOPPLER ULTRASOUND OF OVARIES TECHNIQUE: Both transabdominal and transvaginal ultrasound examinations of the pelvis were performed. Transabdominal technique was performed for global imaging of the pelvis including uterus, ovaries, adnexal regions, and pelvic cul-de-sac. It was necessary to proceed with endovaginal exam following the transabdominal exam to visualize the uterus, right ovary and endometrium better. Color and duplex Doppler ultrasound was utilized to evaluate blood flow to the ovaries. COMPARISON:  CT with IV contrast today, PET-CT 07/06/2023, and earlier CT studies from this year back to 12/18/2022. FINDINGS: Uterus Measurements: Anteverted and heterogeneous measuring 7.2 x 3.8 x 4.3 cm = volume: 61.2 mL. No fibroids or other mass visualized. Incidentally noted is a 2 cm simple nabothian cysts in the cervix. Despite myometrial heterogeneity there is no appreciable focal wall mass. Endometrium Thickness: 5.7 mm.  No  focal abnormality visualized. Right ovary Measurements:  3.0 x 1.4 x 2.0 = volume: 4.3 mL. Normal appearance/no adnexal mass. Left ovary There is a heterogeneous hypervascular left adnexal mass which is inseparable and indistinguishable from the left ovary. On CT this measured 5.4 x 4.8 cm. Ultrasound is 5.5 x 4.8 x 5.2 cm and 72 mL. 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 Pulsed Doppler evaluation of both ovaries demonstrates normal low-resistance arterial and venous waveforms. Other findings Mild anechoic free fluid, which was also seen on CT. IMPRESSION: Heterogeneous hypervascular left adnexal mass which is inseparable from the left ovary. 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 Right ovary, uterus and endometrium are essentially unremarkable with incidental 2 cm simple nabothian cervical cyst. Mildly heterogeneous myometrium. Mild free fluid, nonspecific. Electronically Signed   By: Almira Bar M.D.   On: 07/22/2023 06:01   CT Angio Chest PE W and/or Wo Contrast  Result Date: 07/22/2023 CLINICAL DATA:  Weakness, emesis and diarrhea for the last several days. Currently taking chemotherapy and radiation for metastatic adrenal carcinoma. EXAM: CT ANGIOGRAPHY CHEST CT ABDOMEN AND PELVIS WITH CONTRAST TECHNIQUE: Multidetector CT imaging of the chest was performed using the standard protocol during bolus administration of intravenous contrast. Multiplanar CT image reconstructions and MIPs were obtained to evaluate the vascular anatomy. Multidetector CT imaging of the abdomen and pelvis was performed using the standard protocol during bolus  administration of intravenous contrast. RADIATION DOSE REDUCTION: This exam was performed according to the departmental dose-optimization program which includes automated exposure control, adjustment of the mA and/or kV according to patient size and/or use of iterative reconstruction technique. CONTRAST:  OMNIPAQUE IOHEXOL 350 MG/ML SOLN COMPARISON:  Portable chest today, PET-CT 07/06/2023, chest, abdomen and pelvis outside CT images only dated 02/03/2023. FINDINGS: CTA CHEST FINDINGS Cardiovascular: Pulmonary arteries are normal in caliber and do not show embolic filling defects. There is minimal atherosclerosis in the aortic arch. There is no aneurysm, stenosis or dissection. The great vessels are clear.  The pulmonary veins are nondistended. There is mild cardiomegaly with a left chamber predominance. There is a right chest port with IJ approach catheter with tip in the right atrium. There is a small pericardial effusion new from 07/06/2023. No visible coronary calcific plaques. Mediastinum/Nodes: Lower paratracheal space node measuring 8 mm in short axis on 4:45 and adjacent right-sided precarinal lymph node also 8 mm short axis. Subcarinal lymph node up to 9 mm short axis. All of these with uptake on PET-CT but no bulky adenopathy. No hilar adenopathy. In the left axilla multiple enlarged lymph nodes again are noted, with multifocal PET activity. Largest of these is 1.4 x 3.3 cm on 4:17, 2 x 1.5 cm on 4:20 and 2 x 1.5 cm on 4:31. There has been no change since 07/06/2023. There is an enlarged right axillary node measuring 1.3 x 1.8 cm on 4:34. Right cardiophrenic angle lymph nodes largest of these is 9 mm in short axis on 4:95. Lungs/Pleura: Mildly elevated right hemidiaphragm. Unchanged 6 mm pleural-based posterior left upper lobe nodule on 5:36. Unchanged 5 mm pleural-based nodule anteriorly in the left upper lobe on 5:49. There are no other appreciable nodules. There is no consolidation, effusion or  pneumothorax. 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 There are scattered thoracic spine endplate Schmorl's nodes but no acute compression fracture. Findings are more confluent and widespread than on 02/03/2023. Review of the MIP images confirms the above findings. CT ABDOMEN and PELVIS FINDINGS Hepatobiliary: No hepatic mass enhancement. Mild hepatic steatosis. Cholelithiasis without evidence of cholecystitis or biliary dilatation. Pancreas: No abnormality. Spleen: No abnormality. 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 Both kidneys enhance homogeneously. There is no urinary stone or obstruction. No bladder thickening. Stomach/Bowel: There is fluid in the colon in the ascending and transverse segments. There are increasing thickened folds in the distal descending and proximal/mid sigmoid colon and adjacent stranding consistent with colitis or diverticulitis. There is no wall pneumatosis, free air, or abscess. Vascular/Lymphatic: Small hypermetabolic lymph nodes were noted on the recent PET-CT and have not changed. Examples include an aortocaval lymph node 7 mm short axis on 11: 39, left external iliac chain node is 9 mm short axis on 11:77, with scattered subcentimeter hypermetabolic inguinal lymph nodes also recently seen. By strict size criteria, majority of the hypermetabolic nodes are not frankly enlarged. There are slightly prominent left periaortic chain nodes up to 1 cm in short axis. Large heterogeneous left adnexal/pelvic mass again measures 5.4 x 4.8 cm on 11:72. This is about double the size it was on an earlier study this year on 12/18/2022. Reproductive: The uterus is intact. No new adnexal abnormality. 2 cm transmural fibroid of the right side of the fundus. Other: Small volume of pelvic ascites. No free hemorrhage, free air or localizing collections. Musculoskeletal: Diffuse osteoblastic metastatic disease, also  has progressed from 02/03/2023. No visible pathologic regional fracture. Review of the MIP images confirms the above findings. IMPRESSION: 1. No evidence of arterial embolus or acute chest process. 2. Small pericardial effusion, new from 07/06/2023. 3. Cardiomegaly without evidence of CHF. 4. Unchanged 6 mm and 5 mm left upper lobe nodules. 5. Bilateral axillary adenopathy, left greater than right, with borderline to slightly prominent mediastinal, retroperitoneal and pelvic lymph nodes. Positive PET activity on recent PET-CT. 6. 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 7. Increasing thickened folds in the distal descending and proximal/mid sigmoid colon with adjacent stranding consistent with colitis or diverticulitis. No wall pneumatosis, free air, or abscess. 8. 5.4 x 4.8 cm heterogeneous left adnexal/pelvic mass almost certainly metastatic, about double the size it was on an earlier study this year on 12/18/2022. 9. Small volume of pelvic ascites.  No free air. 10. Cholelithiasis without evidence of cholecystitis. 11. Mild hepatic steatosis. Electronically Signed   By: Almira Bar M.D.   On: 07/22/2023 04:40   CT ABDOMEN PELVIS W CONTRAST  Result Date: 07/22/2023 CLINICAL DATA:  Weakness, emesis and diarrhea for the last several days. Currently taking chemotherapy and radiation for metastatic adrenal carcinoma. EXAM: CT ANGIOGRAPHY CHEST CT ABDOMEN AND PELVIS WITH CONTRAST TECHNIQUE: Multidetector CT imaging of the chest was performed using the standard protocol during bolus administration of intravenous contrast. Multiplanar CT image reconstructions and MIPs were obtained to evaluate the vascular anatomy. Multidetector CT imaging of the abdomen and pelvis was performed using the standard protocol during bolus administration of intravenous contrast. RADIATION DOSE REDUCTION: This exam was performed according to the departmental dose-optimization program which includes automated  exposure control, adjustment of the mA and/or kV according to patient size and/or use of iterative reconstruction technique. CONTRAST:  OMNIPAQUE IOHEXOL 350 MG/ML SOLN COMPARISON:  Portable chest  today, PET-CT 07/06/2023, chest, abdomen and pelvis outside CT images only dated 02/03/2023. FINDINGS: CTA CHEST FINDINGS Cardiovascular: Pulmonary arteries are normal in caliber and do not show embolic filling defects. There is minimal atherosclerosis in the aortic arch. There is no aneurysm, stenosis or dissection. The great vessels are clear.  The pulmonary veins are nondistended. There is mild cardiomegaly with a left chamber predominance. There is a right chest port with IJ approach catheter with tip in the right atrium. There is a small pericardial effusion new from 07/06/2023. No visible coronary calcific plaques. Mediastinum/Nodes: Lower paratracheal space node measuring 8 mm in short axis on 4:45 and adjacent right-sided precarinal lymph node also 8 mm short axis. Subcarinal lymph node up to 9 mm short axis. All of these with uptake on PET-CT but no bulky adenopathy. No hilar adenopathy. In the left axilla multiple enlarged lymph nodes again are noted, with multifocal PET activity. Largest of these is 1.4 x 3.3 cm on 4:17, 2 x 1.5 cm on 4:20 and 2 x 1.5 cm on 4:31. There has been no change since 07/06/2023. There is an enlarged right axillary node measuring 1.3 x 1.8 cm on 4:34. Right cardiophrenic angle lymph nodes largest of these is 9 mm in short axis on 4:95. Lungs/Pleura: Mildly elevated right hemidiaphragm. Unchanged 6 mm pleural-based posterior left upper lobe nodule on 5:36. Unchanged 5 mm pleural-based nodule anteriorly in the left upper lobe on 5:49. There are no other appreciable nodules. There is no consolidation, effusion or pneumothorax. 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 There are scattered thoracic spine endplate Schmorl's nodes but no acute  compression fracture. Findings are more confluent and widespread than on 02/03/2023. Review of the MIP images confirms the above findings. CT ABDOMEN and PELVIS FINDINGS Hepatobiliary: No hepatic mass enhancement. Mild hepatic steatosis. Cholelithiasis without evidence of cholecystitis or biliary dilatation. Pancreas: No abnormality. Spleen: No abnormality. 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 Both kidneys enhance homogeneously. There is no urinary stone or obstruction. No bladder thickening. Stomach/Bowel: There is fluid in the colon in the ascending and transverse segments. There are increasing thickened folds in the distal descending and proximal/mid sigmoid colon and adjacent stranding consistent with colitis or diverticulitis. There is no wall pneumatosis, free air, or abscess. Vascular/Lymphatic: Small hypermetabolic lymph nodes were noted on the recent PET-CT and have not changed. Examples include an aortocaval lymph node 7 mm short axis on 11: 39, left external iliac chain node is 9 mm short axis on 11:77, with scattered subcentimeter hypermetabolic inguinal lymph nodes also recently seen. By strict size criteria, majority of the hypermetabolic nodes are not frankly enlarged. There are slightly prominent left periaortic chain nodes up to 1 cm in short axis. Large heterogeneous left adnexal/pelvic mass again measures 5.4 x 4.8 cm on 11:72. This is about double the size it was on an earlier study this year on 12/18/2022. Reproductive: The uterus is intact. No new adnexal abnormality. 2 cm transmural fibroid of the right side of the fundus. Other: Small volume of pelvic ascites. No free hemorrhage, free air or localizing collections. 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 No visible pathologic regional fracture. Review of the MIP images confirms the above findings. IMPRESSION: 1. No evidence of  arterial embolus or acute chest process. 2. Small pericardial effusion, new from 07/06/2023. 3. Cardiomegaly without evidence of CHF. 4. Unchanged 6 mm and 5 mm left upper lobe nodules. 5.  Bilateral axillary adenopathy, left greater than right, with borderline to slightly prominent mediastinal, retroperitoneal and pelvic lymph nodes. Positive PET activity on recent PET-CT. 6. 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 7. Increasing thickened folds in the distal descending and proximal/mid sigmoid colon with adjacent stranding consistent with colitis or diverticulitis. No wall pneumatosis, free air, or abscess. 8. 5.4 x 4.8 cm heterogeneous left adnexal/pelvic mass almost certainly metastatic, about double the size it was on an earlier study this year on 12/18/2022. 9. Small volume of pelvic ascites.  No free air. 10. Cholelithiasis without evidence of cholecystitis. 11. Mild hepatic steatosis. Electronically Signed   By: Almira Bar M.D.   On: 07/22/2023 04:40   DG Chest Port 1 View  Result Date: 07/22/2023 CLINICAL DATA:  Generalized weakness for several days, history of adrenal carcinoma EXAM: PORTABLE CHEST 1 VIEW COMPARISON:  PET-CT from 07/06/2019 FINDINGS: Cardiac shadow is within normal limits. Lungs are well aerated bilaterally. Right chest wall port is noted. Bony structures demonstrate increased sclerosis consistent with metastatic disease. IMPRESSION: Changes consistent with bony metastatic disease. These are stable from prior PET-CT. No acute abnormality noted. Electronically Signed   By: Alcide Clever M.D.   On: 07/22/2023 02:50   NM PET Image Restag (PS) Skull Base To Thigh  Result Date: 07/15/2023 CLINICAL DATA:  Subsequent treatment strategy for adrenal carcinoma. EXAM: NUCLEAR MEDICINE PET SKULL BASE TO THIGH TECHNIQUE: 9.6 mCi F-18 FDG was injected intravenously. Full-ring PET imaging was performed from the skull base to thigh after the radiotracer. CT data was  obtained and used for attenuation correction and anatomic localization. Fasting blood glucose: 93 mg/dl COMPARISON:  78/46/9629. FINDINGS: Mediastinal blood pool activity: SUV max 2.3 Liver activity: SUV max NA NECK: Worsening cervical hypermetabolic adenopathy. Index posterior left level II/III lymph node measures 8 mm (7/25), SUV max 6.1, previously 4 mm and 2.2. Incidental CT findings: None. CHEST: Hypermetabolic mediastinal and axillary lymph nodes, progressive. Index left axillary lymph node measures 13 mm (7/47), SUV max 7.9, compared to 10 mm and 6.8. Incidental CT findings: Right IJ Port-A-Cath terminates in the right atrium. Heart is enlarged. No pericardial or pleural effusion. ABDOMEN/PELVIS: Similar hypermetabolic abdominal retroperitoneal lymph nodes. Index aortocaval lymph node measures 8 mm (7/96), SUV max 4.5. Progressive pelvic retroperitoneal lymph nodes. Index left external iliac lymph node measures 12 mm (7/134), SUV max 8.2, previously 10 mm and 2.4. Additional small hypermetabolic inguinal lymph nodes. Enlarging left adnexal mass now measures 4.5 x 5.5 cm (7/128), SUV max similar at 3.3, previously 4.4 x 4.7 cm. Incidental CT findings: Liver is grossly unremarkable. Gallstones. Adrenal glands, kidneys, spleen, pancreas, stomach and bowel are grossly unremarkable. SKELETON: Mottled sclerosis and hypermetabolism throughout the visualized osseous structures, similar to the prior exam. Index hypermetabolism in the proximal right femur, SUV max 8.3, compared to 9.3. Incidental CT findings: None. IMPRESSION: 1. Slight interval progression in metastatic disease as evidenced by enlarging and/or increasing hypermetabolic lymph nodes in the neck, chest and pelvis. Abdominal retroperitoneal lymph nodes and osseous metastatic disease appear similar. 2. Enlarging and mildly hypermetabolic left adnexal mass is worrisome for a metastasis. Previous ultrasound 02/24/2023. 3. Cholelithiasis. Electronically  Signed   By: Leanna Battles M.D.   On: 07/15/2023 13:06    PERFORMANCE STATUS (ECOG) : 3 - Symptomatic, >50% confined to bed  Review of Systems Unless otherwise noted, a complete review of systems is negative.  Physical Exam General: NAD Cardiovascular: regular rate and rhythm Pulmonary: clear ant fields Abdomen: soft,  nontender, + bowel sounds GU: no suprapubic tenderness Extremities: no edema, no joint deformities Skin: no rashes Neurological: Weakness but otherwise nonfocal  IMPRESSION: Follow-up visit.  Weekend notes reviewed.  Pain reportedly significantly worse over the weekend.  IV hydromorphone dosing was liberalized.  In the past 24 hours, patient has received approximately 16 mg IV hydromorphone.  Presently, patient says that her pain is better on this dosing.  Patient continues to endorse generalized back and abdominal pain.  MRI of spine revealed widespread metastatic disease without evidence of pathologic fracture.  Discussed options for adjustment of patient's pain regimen.  Will start patient on transdermal fentanyl to hopefully provide better control pain without need for frequent/around-the-clock IV hydromorphone.  Will start fentanyl patch 50 mcg every 72 hours.  This dose reflects approximately 50% reduction to account for incomplete cross tolerance.  PLAN: -Continue current scope of treatment -Start fentanyl patch 50 mcg every 72 hours -Continue IV hydromorphone for severe breakthrough pain but would prefer to restart utilizing oxycodone for breakthrough pain in anticipation of eventual discharge -Will follow  Case and plan discussed with Dr. Orlie Dakin and Dr. Sherryll Burger   Time Total: 25 minutes  Visit consisted of counseling and education dealing with the complex and emotionally intense issues of symptom management and palliative care in the setting of serious and potentially life-threatening illness.Greater than 50%  of this time was spent counseling and  coordinating care related to the above assessment and plan.  Signed by: Laurette Schimke, PhD, NP-C

## 2023-08-01 NOTE — Progress Notes (Signed)
[facility]  - [place] at [facility]   PATIENT NAME: Sheryl Silva    MR#:  [id number]  DATE OF BIRTH:  10-04-1976  SUBJECTIVE:  Diarrhea improved.  Her back pain and abdominal pain is much better controlled.  She is appreciative for her care VITALS:  Blood pressure 131/83, pulse 98, temperature 98.5 F (36.9 C), resp. rate 16, height 5' (1.524 m), weight 77.1 kg, SpO2 100%.  PHYSICAL EXAMINATION:   GENERAL:  47 y.o.-year-old patient with no acute distress.  LUNGS: Normal breath sounds bilaterally CARDIOVASCULAR: S1, S2 normal. No murmur   ABDOMEN: Soft, benign. EXTREMITIES: No  edema b/l.    NEUROLOGIC: nonfocal  patient is alert and awake  LABORATORY PANEL:  CBC Recent Labs  Lab 08/01/23 0329  WBC 3.5*  HGB 8.9*  HCT 27.1*  PLT 36*    Chemistries  Recent Labs  Lab 07/30/23 0202 07/31/23 0341 08/01/23 0329  NA 133* 133* 132*  K 3.0* 3.3* 4.0  CL 102 104 101  CO2 21* 24 23  GLUCOSE 126* 125* 128*  BUN 15 16 18   CREATININE 0.56 0.63 0.63  CALCIUM 7.6* 7.8* 7.8*  MG 2.0  --   --   AST  --  61*  --   ALT  --  99*  --   ALKPHOS  --  173*  --   BILITOT  --  0.6  --     RADIOLOGY:  MR THORACIC SPINE W WO CONTRAST  Result Date: 07/30/2023 CLINICAL DATA:  Metastatic carcinoma of gastrointestinal primary. EXAM: MRI THORACIC WITHOUT AND WITH CONTRAST TECHNIQUE: Multiplanar and multiecho pulse sequences of the thoracic spine were obtained without and with intravenous contrast. CONTRAST:  7mL GADAVIST GADOBUTROL 1 MMOL/ML IV SOLN COMPARISON:  None Available. FINDINGS: Alignment:  Normal Vertebrae: Widespread metastatic disease throughout the marrow spaces of every vertebral body level with involvement of the vertebral bodies and posterior elements. No pathologic fracture is seen. No evidence of epidural tumor. No evidence of metastatic disease to the cord. No foraminal disease identified in the thoracic region. Cord:  No evidence of metastatic disease  to the cord is self. Paraspinal and other soft tissues: Posterior rib involvement is present as well. Disc levels: Disc degeneration and bulging at T7-8. Narrowing of the ventral subarachnoid space but no compression of the cord. IMPRESSION: 1. Widespread metastatic disease throughout the marrow spaces of every vertebral body level with involvement of the vertebral bodies and posterior elements. No pathologic fracture. No evidence of epidural tumor. No evidence of metastatic disease to the cord or neural foramina. 2. Disc degeneration and bulging at T7-8 with narrowing of the ventral subarachnoid space but no compression of the cord. Electronically Signed   By: Paulina Fusi M.D.   On: 07/30/2023 21:16   MR Lumbar Spine W Wo Contrast  Result Date: 07/30/2023 CLINICAL DATA:  Metastatic adenocarcinoma of gastrointestinal source. EXAM: MRI LUMBAR SPINE WITHOUT AND WITH CONTRAST TECHNIQUE: Multiplanar and multiecho pulse sequences of the lumbar spine were obtained without and with intravenous contrast. CONTRAST:  7mL GADAVIST GADOBUTROL 1 MMOL/ML IV SOLN COMPARISON:  04/17/2022 FINDINGS: Segmentation:  5 lumbar type vertebral bodies. Alignment:  Normal Vertebrae: Metastatic disease throughout the marrow spaces with involvement of every vertebral level and the sacrum. Iliac bones are also diffusely involved. The spinal involvement includes vertebral body involvement and posterior element involvement. No evidence of a pathologic fracture. Some extraosseous extension of tumor along the left side of the L2 vertebral body. Early tumor  involvement of the intervertebral foramina on the left at L1-2 and L2-3. Conus medullaris and cauda equina: Conus extends to the L1 level. No evidence of metastatic disease to the distal cord, conus tip or nerve roots. Paraspinal and other soft tissues: Otherwise negative Disc levels: No significant disc level pathology. IMPRESSION: 1. Widespread osseous metastatic disease throughout the  spine, sacrum and iliac bones. No evidence of pathologic fracture. Some extraosseous extension of tumor along the left side of the L2 vertebral body. Early tumor involvement of the intervertebral foramina on the left at L1-2 and L2-3. 2. No evidence of metastatic disease to the distal cord, conus tip or nerve roots. Electronically Signed   By: Paulina Fusi M.D.   On: 07/30/2023 21:13   MR CERVICAL SPINE W WO CONTRAST  Result Date: 07/30/2023 CLINICAL DATA:  Metastatic disease evaluation. 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 EXAM: MRI CERVICAL SPINE WITHOUT AND WITH CONTRAST TECHNIQUE: Multiplanar and multiecho pulse sequences of the cervical spine, to include the craniocervical junction and cervicothoracic junction, were obtained without and with intravenous contrast. CONTRAST:  7mL GADAVIST GADOBUTROL 1 MMOL/ML IV SOLN COMPARISON:  04/04/2022 FINDINGS: Alignment: Straightening of the normal cervical lordosis. Vertebrae and spinal cord: Metastatic disease throughout the region affecting all of the vertebral bodies, the clivus and the posterior elements. No evidence of extraosseous tumor causing neural encroachment. No metastatic disease to the spinal cord itself. Posterior Fossa, vertebral arteries, paraspinal tissues: Enlarged cervical lymph nodes on the left. Disc levels: Mild bulging of the disc. Involution of a previously seen herniation at C4-5. No compressive stenosis of the canal or foramina due to disc disease. IMPRESSION: 1. Widespread osseous metastatic disease throughout the cervical spine, the clivus and the posterior elements. Involvement of every vertebral level. No evidence of extraosseous tumor causing neural encroachment. No evidence of metastatic disease to the spinal cord itself. 2. Enlarged cervical lymph nodes on the left consistent with metastatic disease. 3. Involution of a previously seen disc herniation at C4-5. No compressive stenosis of the canal or foramina due to  degenerative disease. Electronically Signed   By: Paulina Fusi M.D.   On: 07/30/2023 21:09    Assessment and Plan  Sheryl Silva is a 47 y.o. 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  Patient was diagnosed with stage IV metastatic cancer initially was with on no arranging, pathology from axillary lymph node showed possible GI source.  Patient was started on chemo and radiation therapy with [id number] q. 14 days and so far she completed 7 cycles.  She said that with each cycle of chemotherapy she has had mild diarrhea but was never severe and or self-limiting.  Last chemotherapy and radiation was 9/16-17 Monday, patient started develop severe cramping pain in the left lower quadrant for 2 days then started to have a severe diarrhea,   CTA negative for PE, extensive evidence of metastatic cancer involving the bones, lymph nodes.  Acute colitis versus diverticulitis descending and sigmoid colon.  Left adnexal pelvic mass.  Pelvic ultrasound showed heterogeneous hypervascular left adnexal mass probably metastatic in nature.   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  Dose  increased to 1 to 2 mg every 3 hours for better pain control.  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  MRI of the cervical and thoracolumbar spine shows metastasis all over the spine but not in the spinal cord.  This is not new according to patient and per oncology  Trial of K-pad and as needed Flexeril  Severe hypokalemia -- Normalized  Hyponatremia -- likely from diarrhea and poor PO intake.  Sodium 132 -- sodium improving.  Diarrhea improved.  She reports no bowel movement for last 2 days  Cardiomegaly and small pericardial effusion -Stable status post 1 unit of PRBC transfusion on 9/27  Stage IV metastatic signet ring adenocarcinoma -- patient follows with Dr. Orlie Dakin -- patient getting chemotherapy. She finished radiation therapy  Chronic back pain PRN PO and IV pain meds --Palliative care Josh managing pain meds.  Will increase Dilaudid frequency to every 3 hours from every 4 hours as she is very uncomfortable -Continue IV hydromorphone and p.o. Percocet, tramadol  H/o Buerger's dz Tobacco use --stable  Pancytopenia  leukopenia is improved -likely response to fulphilia given on 9/18.  Status post 2 platelet transfusion thus far.  9/26, 9/27.  Thrombocytopenia somewhat improved after transfusion.   Status post 1 PRBC transfusion on 9/27 for hemoglobin of 7     Latest Ref Rng & Units 08/01/2023    3:29 AM 07/31/2023    3:41 AM 07/30/2023    2:02 AM  CBC  WBC 4.0 - 10.5 K/uL 3.5  4.6  2.9   Hemoglobin 12.0 - 15.0 g/dL 8.9  8.5  8.3   Hematocrit 36.0 - 46.0 % 27.1  25.1  24.1   Platelets 150 - 400 K/uL 36  31  46      Family communication : None at bedside Consults :GI, Oncology, ID, palliative care CODE STATUS: full code DVT Prophylaxis : SCDs, she is thrombocytopenic cannot give any blood thinners Level of care: Telemetry Medical Status is: Inpatient Remains inpatient appropriate because: Requiring IV pain  management      TOTAL TIME TAKING CARE OF THIS PATIENT: 35 minutes.  >50% time spent on counselling and coordination of care  Note: This dictation was prepared with Dragon dictation along with smaller phrase technology. Any transcriptional errors that result from this process are unintentional.  Delfino Lovett M.D    [facility]   CC: Primary care physician; [name], MD

## 2023-08-01 NOTE — Progress Notes (Signed)
PHARMACY CONSULT NOTE  Pharmacy Consult for Electrolyte Monitoring and Replacement   Recent Labs: Potassium (mmol/L)  Date Value  08/01/2023 4.0   Magnesium (mg/dL)  Date Value  96/01/5408 2.0   Calcium (mg/dL)  Date Value  81/19/1478 7.8 (L)   Albumin (g/dL)  Date Value  29/56/2130 2.4 (L)   Phosphorus (mg/dL)  Date Value  86/57/8469 3.0   Sodium (mmol/L)  Date Value  08/01/2023 132 (L)   Assessment: 47 y.o. 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 Pharmacy is asked to follow and replace electrolytes  Goal of Therapy:  Electrolytes WNL  Plan:  --no electrolyte replacement warranted for today --Follow-up BMP with AM labs tomorrow  Lowella Bandy 08/01/2023 7:58 AM

## 2023-08-01 NOTE — Progress Notes (Signed)
Mobility Specialist - Progress Note     08/01/23 1232  Mobility  Activity Ambulated with assistance in hallway  Level of Assistance Standby assist, set-up cues, supervision of patient - no hands on  Assistive Device Front wheel walker  Distance Ambulated (ft) 320 ft  Range of Motion/Exercises Active  Activity Response Tolerated well  Mobility Referral Yes  $Mobility charge 1 Mobility  Mobility Specialist Start Time (ACUTE ONLY) 1215  Mobility Specialist Stop Time (ACUTE ONLY) 1230  Mobility Specialist Time Calculation (min) (ACUTE ONLY) 15 min   Pt resting in bed on RA upon entry. Pt STS and ambulates to hallway around NS SBA with RW for 2 laps. Pt returned to recliner and left with needs in reach. Pt endorses less pain this ambulation session.   Johnathan Hausen Mobility Specialist 08/01/23, 12:36 PM

## 2023-08-02 ENCOUNTER — Other Ambulatory Visit: Payer: Self-pay

## 2023-08-02 ENCOUNTER — Encounter: Payer: Self-pay | Admitting: Oncology

## 2023-08-02 DIAGNOSIS — K5150 Left sided colitis without complications: Secondary | ICD-10-CM | POA: Diagnosis not present

## 2023-08-02 DIAGNOSIS — K529 Noninfective gastroenteritis and colitis, unspecified: Secondary | ICD-10-CM | POA: Diagnosis not present

## 2023-08-02 DIAGNOSIS — A0839 Other viral enteritis: Secondary | ICD-10-CM | POA: Diagnosis not present

## 2023-08-02 DIAGNOSIS — C801 Malignant (primary) neoplasm, unspecified: Secondary | ICD-10-CM | POA: Diagnosis not present

## 2023-08-02 DIAGNOSIS — Z515 Encounter for palliative care: Secondary | ICD-10-CM | POA: Diagnosis not present

## 2023-08-02 LAB — MAGNESIUM: Magnesium: 1.9 mg/dL (ref 1.7–2.4)

## 2023-08-02 LAB — CBC
HCT: 24.2 % — ABNORMAL LOW (ref 36.0–46.0)
Hemoglobin: 8 g/dL — ABNORMAL LOW (ref 12.0–15.0)
MCH: 31 pg (ref 26.0–34.0)
MCHC: 33.1 g/dL (ref 30.0–36.0)
MCV: 93.8 fL (ref 80.0–100.0)
Platelets: 32 K/uL — ABNORMAL LOW (ref 150–400)
RBC: 2.58 MIL/uL — ABNORMAL LOW (ref 3.87–5.11)
RDW: 16.8 % — ABNORMAL HIGH (ref 11.5–15.5)
WBC: 4.6 K/uL (ref 4.0–10.5)
nRBC: 1.1 % — ABNORMAL HIGH (ref 0.0–0.2)

## 2023-08-02 LAB — BASIC METABOLIC PANEL WITH GFR
Anion gap: 5 (ref 5–15)
BUN: 16 mg/dL (ref 6–20)
CO2: 24 mmol/L (ref 22–32)
Calcium: 7.6 mg/dL — ABNORMAL LOW (ref 8.9–10.3)
Chloride: 97 mmol/L — ABNORMAL LOW (ref 98–111)
Creatinine, Ser: 0.43 mg/dL — ABNORMAL LOW (ref 0.44–1.00)
GFR, Estimated: >60 mL/min
Glucose, Bld: 100 mg/dL — ABNORMAL HIGH (ref 70–99)
Potassium: 4.5 mmol/L (ref 3.5–5.1)
Sodium: 126 mmol/L — ABNORMAL LOW (ref 135–145)

## 2023-08-02 LAB — PHOSPHORUS: Phosphorus: 2.9 mg/dL (ref 2.5–4.6)

## 2023-08-02 MED ORDER — ALUM & MAG HYDROXIDE-SIMETH 200-200-20 MG/5ML PO SUSP
30.0000 mL | Freq: Four times a day (QID) | ORAL | Status: DC | PRN
  Administered 2023-08-02 – 2023-08-03 (×2): 30 mL via ORAL
  Filled 2023-08-02 (×2): qty 30

## 2023-08-02 MED ORDER — NYSTATIN 100000 UNIT/ML MT SUSP
5.0000 mL | Freq: Four times a day (QID) | OROMUCOSAL | Status: DC
  Administered 2023-08-02 – 2023-08-03 (×5): 500000 [IU] via ORAL
  Filled 2023-08-02 (×7): qty 5

## 2023-08-02 MED ORDER — VALGANCICLOVIR HCL 450 MG PO TABS
900.0000 mg | ORAL_TABLET | Freq: Two times a day (BID) | ORAL | 0 refills | Status: DC
  Filled 2023-08-02: qty 56, 14d supply, fill #0
  Filled 2023-08-02: qty 112, 28d supply, fill #0

## 2023-08-02 MED ORDER — LIDOCAINE VISCOUS HCL 2 % MT SOLN
15.0000 mL | Freq: Once | OROMUCOSAL | Status: AC
  Administered 2023-08-02: 15 mL via ORAL
  Filled 2023-08-02: qty 15

## 2023-08-02 NOTE — Plan of Care (Signed)

## 2023-08-02 NOTE — Progress Notes (Signed)
PHARMACY CONSULT NOTE  Pharmacy Consult for Electrolyte Monitoring and Replacement   Recent Labs: Potassium (mmol/L)  Date Value  08/02/2023 4.5   Magnesium (mg/dL)  Date Value  65/78/4696 1.9   Calcium (mg/dL)  Date Value  29/52/8413 7.6 (L)   Albumin (g/dL)  Date Value  24/40/1027 2.4 (L)   Phosphorus (mg/dL)  Date Value  25/36/6440 2.9   Sodium (mmol/L)  Date Value  08/02/2023 126 (L)   Assessment: 47 y.o. 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 Pharmacy is asked to follow and replace electrolytes  Goal of Therapy:  Electrolytes WNL  Plan:  --no electrolyte replacement warranted for today --Follow-up BMP with AM labs tomorrow  Lowella Bandy 08/02/2023 7:08 AM

## 2023-08-02 NOTE — Progress Notes (Signed)
Pharmacy - Antimicrobial Stewardship (medication education)  Patient to discharge on valganciclovir 450mg  - two tabs twice a day for 28 days.  Prescription sent to [facility] community pharmacy and delivered to main pharmacy to give to patient at time of discharge.   Prior authorization performed and copay $0.  Educated patient on how to take the medication and side effects to be aware of and seek medical attention if any of these occur (ex. Decreased blood counts, kidney injury, visual changes). She is also aware will need lab monitoring.   Juliette Alcide, PharmD, BCPS, BCIDP Work Cell: [phone] 08/02/2023 3:26 PM

## 2023-08-02 NOTE — Progress Notes (Signed)
[facility]  - [place] at [facility]   PATIENT NAME: Sheryl Silva    MR#:  [id number]  DATE OF BIRTH:  01-07-1976  SUBJECTIVE:  Patient to cough fentanyl patch per nursing as she did not like.  She is reporting significant pain 7/10 and wants it better control before she would leave the hospital VITALS:  Blood pressure 128/80, pulse 96, temperature 98.6 F (37 C), temperature source Oral, resp. rate 16, height 5\' 6"  (1.676 m), weight 77.1 kg, SpO2 100%.  PHYSICAL EXAMINATION:   GENERAL:  47 y.o.-year-old patient with no acute distress.  LUNGS: Normal breath sounds bilaterally CARDIOVASCULAR: S1, S2 normal. No murmur   ABDOMEN: Soft, benign. EXTREMITIES: No  edema b/l.    NEUROLOGIC: nonfocal  patient is alert and awake  LABORATORY PANEL:  CBC Recent Labs  Lab 08/02/23 0550  WBC 4.6  HGB 8.0*  HCT 24.2*  PLT 32*    Chemistries  Recent Labs  Lab 07/31/23 0341 08/01/23 0329 08/02/23 0550  NA 133*   < > 126*  K 3.3*   < > 4.5  CL 104   < > 97*  CO2 24   < > 24  GLUCOSE 125*   < > 100*  BUN 16   < > 16  CREATININE 0.63   < > 0.43*  CALCIUM 7.8*   < > 7.6*  MG  --   --  1.9  AST 61*  --   --   ALT 99*  --   --   ALKPHOS 173*  --   --   BILITOT 0.6  --   --    < > = values in this interval not displayed.    RADIOLOGY:  No results found.  Assessment and Plan  Sheryl Silva is a 47 y.o. 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  Patient was diagnosed with stage IV metastatic cancer initially was with on no arranging, pathology from axillary lymph node showed possible GI source.  Patient was started on chemo and radiation therapy with [id number] q. 14 days and so far she completed 7 cycles.  She said that with each cycle of chemotherapy she has had mild diarrhea but was  never severe and or self-limiting.  Last chemotherapy and radiation was 9/16-17 Monday, patient started develop severe cramping pain in the left lower quadrant for 2 days then started to have a severe diarrhea,   CTA negative for PE, extensive evidence of metastatic cancer involving the bones, lymph nodes.  Acute colitis versus diverticulitis descending and sigmoid colon.  Left adnexal pelvic mass.  Pelvic ultrasound showed heterogeneous hypervascular left adnexal mass probably metastatic in nature.   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  Dose increased to 1 to 2 mg every 3 hours for better pain control.  I have discussed with her pros and cons of stronger pain medications including possible respiratory depression -- PRN Imodium for diarrhea.  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  Now transitioning to oral valganciclovir for 4 weeks.  Pharmacy has gone over side effects.  She may need ongoing labs monitoring per ID  Severe back pain/spasm Unsure if there is any metastasis in the spine.  MRI of the cervical and thoracolumbar spine shows metastasis all over the spine but not in the spinal cord.  This is not new according to patient and per oncology  Palliative care, Josh and Dr. Orlie Dakin are finalizing her ongoing pain medication regimen before discharge  Severe hypokalemia -- Normalized  Hyponatremia -- likely from diarrhea and poor PO intake.  Sodium 132 -- sodium improving.  Diarrhea improved.  She reports  no bowel movement for last 2 days  Cardiomegaly and small pericardial effusion -Stable status post 1 unit of PRBC transfusion on 9/27  Stage IV metastatic signet ring adenocarcinoma -- patient follows with Dr. Orlie Dakin -- patient getting chemotherapy. She finished radiation therapy  Chronic back pain PRN PO and IV pain meds --Palliative care Josh managing pain meds.  She is on Dilaudid, Duragesic patch, oxycodone and tramadol. -Await final pain medication regimen from palliative care/oncology for discharge  H/o Buerger's dz Tobacco use --stable  Pancytopenia  leukopenia is improved -likely response to fulphilia given on 9/18.  Status post 2 platelet transfusion thus far.  9/26, 9/27.  Thrombocytopenia somewhat improved after transfusion.   Status post 1 PRBC transfusion on 9/27 for hemoglobin of 7     Latest Ref Rng & Units 08/02/2023    5:50 AM 08/01/2023    3:29 AM 07/31/2023    3:41 AM  CBC  WBC 4.0 - 10.5 K/uL 4.6  3.5  4.6   Hemoglobin 12.0 - 15.0 g/dL 8.0  8.9  8.5   Hematocrit 36.0 - 46.0 % 24.2  27.1  25.1   Platelets 150 - 400 K/uL 32  36  31      Family communication : None at bedside Consults :GI, Oncology, ID, palliative care CODE STATUS: full code DVT Prophylaxis : SCDs Level of care: Telemetry Medical Status is: Inpatient Remains inpatient appropriate because: Requiring IV pain management, will need good pain medication plan before discharge home.  Transitioning her to oral Valganciclovir from IV today      TOTAL TIME TAKING CARE OF THIS PATIENT: 35 minutes.  >50% time spent on counselling and coordination of care  Note: This dictation was prepared with Dragon dictation along with smaller phrase technology. Any transcriptional errors that result from this process are unintentional.  Delfino Lovett M.D    [facility]   CC: Primary care physician; [name], MD

## 2023-08-02 NOTE — Progress Notes (Signed)
Palliative Medicine [facility] at [facility] [phone] [phone]   Name: Sheryl Silva Date: 08/02/2023 MRN: [id number]  DOB: [birth year]  Patient Care Team: [name], MD as PCP - General (Internal Medicine) Jim Like, RN as Registered Nurse Scarlett Presto, RN (Inactive) as Registered Nurse Benita Gutter, RN as Oncology Nurse Navigator Jeralyn Ruths, MD as Consulting Physician (Oncology)    REASON FOR CONSULTATION: Sheryl Silva is a 47 y.o. 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  Patient has been on regimen of FOLFOX plus Keytruda.  She was admitted to the hospital 07/22/2023 with abdominal pain.  CT of the abdomen and pelvis showed progressive mediastinal, retroperitoneal, and pelvic lymphadenopathy, diffuse osteoblastic metastatic disease, and left adnexal pelvic mass.  Patient also with findings of colitis.  Palliative care was consulted to address goals and manage ongoing symptoms.   CODE STATUS: Full code  PAST MEDICAL HISTORY: Past Medical History:  Diagnosis Date   Anxiety    Cancer (HCC)    Depression    Heart murmur    Hypertension     PAST SURGICAL HISTORY:  Past Surgical History:  Procedure Laterality Date   BIOPSY  07/26/2023   Procedure: BIOPSY;  Surgeon: Toney Reil, MD;  Location: [facility] ENDOSCOPY;  Service: Gastroenterology;;   CESAREAN SECTION     COLONOSCOPY WITH PROPOFOL N/A 02/21/2023   Procedure: COLONOSCOPY WITH PROPOFOL;  Surgeon: Regis Bill, MD;  Location: [facility] ENDOSCOPY;  Service: Endoscopy;  Laterality: N/A;   DILATION AND CURETTAGE OF UTERUS     FLEXIBLE SIGMOIDOSCOPY N/A 07/26/2023   Procedure: FLEXIBLE SIGMOIDOSCOPY;  Surgeon: Toney Reil, MD;  Location: [facility] ENDOSCOPY;  Service: Gastroenterology;  Laterality: N/A;   IR IMAGING GUIDED PORT INSERTION  02/16/2023     HEMATOLOGY/ONCOLOGY HISTORY:  Oncology History  Signet ring cell adenocarcinoma (HCC)  02/07/2023 Cancer Staging   Staging form: Exocrine Pancreas, AJCC 8th Edition - Clinical stage from 02/07/2023: Stage IV (cTX, cNX, pM1) - Signed by Jeralyn Ruths, MD on 02/14/2023 Stage prefix: Initial diagnosis   02/14/2023 Initial Diagnosis   Signet ring cell adenocarcinoma   03/02/2023 -  Chemotherapy   Patient is on Treatment Plan : GI ORIGIN FOLFOX+KEYTRUDA q21d x12 followed by Gwenlyn Fudge       ALLERGIES:  is allergic to nifedipine.  MEDICATIONS:  Current Facility-Administered Medications  Medication Dose Route Frequency Provider Last Rate Last Admin   0.9 %  sodium chloride infusion   Intravenous PRN Enedina Finner, MD   Stopped at 07/30/23 0319   acetaminophen (TYLENOL) tablet 650 mg  650 mg Oral Q6H PRN Mikey College T, MD   650 mg at 08/01/23 0328   albuterol (PROVENTIL) (2.5 MG/3ML) 0.083% nebulizer solution 2.5 mg  2.5 mg Nebulization Q4H PRN Manuela Schwartz, NP   2.5 mg at 07/24/23 2001   alum & mag hydroxide-simeth (MAALOX/MYLANTA) 200-200-20 MG/5ML suspension 30 mL  30 mL Oral Q6H PRN Delfino Lovett, MD   30 mL at 08/02/23 1226   busPIRone (BUSPAR) tablet 5 mg  5 mg Oral BID Enedina Finner, MD   5 mg at 08/02/23 2111   Chlorhexidine Gluconate Cloth 2 % PADS 6 each  6 each Topical Daily Jeralyn Ruths, MD   6 each at 08/02/23 1459   cyanocobalamin (VITAMIN B12) tablet 500 mcg  500 mcg Oral Daily Enedina Finner, MD   500 mcg at 08/02/23 1049  cyclobenzaprine (FLEXERIL) tablet 7.5 mg  7.5 mg Oral TID PRN Delfino Lovett, MD   7.5 mg at 08/02/23 1826   dicyclomine (BENTYL) tablet 20 mg  20 mg Oral TID PRN Toney Reil, MD   20 mg at 08/02/23 1050   DULoxetine (CYMBALTA) DR capsule 60 mg  60 mg Oral Daily Enedina Finner, MD   60 mg at 08/02/23 1048   feeding supplement (ENSURE ENLIVE / ENSURE PLUS) liquid 237 mL  237 mL Oral TID BM Delfino Lovett, MD   237 mL at 08/02/23 2112    fentaNYL (DURAGESIC) 50 MCG/HR 1 patch  1 patch Transdermal Q72H Tabor Denham, Daryl Eastern, NP   1 patch at 08/01/23 1724   gabapentin (NEURONTIN) capsule 300 mg  300 mg Oral TID Mikey College T, MD   300 mg at 08/02/23 2112   ganciclovir (CYTOVENE) 385 mg in sodium chloride 0.9 % 100 mL IVPB  5 mg/kg Intravenous BID Lynn Ito, MD 100 mL/hr at 08/02/23 1054 385 mg at 08/02/23 1054   HYDROmorphone (DILAUDID) injection 1-2 mg  1-2 mg Intravenous Q3H PRN Delfino Lovett, MD   2 mg at 08/02/23 2111   loperamide (IMODIUM) capsule 2 mg  2 mg Oral Q6H PRN Delfino Lovett, MD   2 mg at 07/29/23 2133   megestrol (MEGACE) tablet 40 mg  40 mg Oral Daily Mikey College T, MD   40 mg at 08/02/23 1458   multivitamin with minerals tablet 1 tablet  1 tablet Oral Daily Enedina Finner, MD   1 tablet at 08/02/23 1048   nystatin (MYCOSTATIN) 100000 UNIT/ML suspension 500,000 Units  5 mL Oral QID Jeralyn Ruths, MD   500,000 Units at 08/02/23 2112   ondansetron (ZOFRAN) injection 4 mg  4 mg Intravenous Q6H PRN Mikey College T, MD   4 mg at 07/30/23 0958   oxyCODONE-acetaminophen (PERCOCET/ROXICET) 5-325 MG per tablet 1-2 tablet  1-2 tablet Oral Q4H PRN Enedina Finner, MD   2 tablet at 08/02/23 1826   And   oxyCODONE (Oxy IR/ROXICODONE) immediate release tablet 5 mg  5 mg Oral Q4H PRN Enedina Finner, MD   5 mg at 07/28/23 0429   pantoprazole (PROTONIX) EC tablet 40 mg  40 mg Oral Daily Mikey College T, MD   40 mg at 08/02/23 1049   sodium chloride flush (NS) 0.9 % injection 10-40 mL  10-40 mL Intracatheter Q12H Jeralyn Ruths, MD   10 mL at 08/02/23 1051   sodium chloride flush (NS) 0.9 % injection 10-40 mL  10-40 mL Intracatheter PRN Jeralyn Ruths, MD       traMADol Janean Sark) tablet 50 mg  50 mg Oral Q12H PRN Enedina Finner, MD   50 mg at 07/25/23 0250   traZODone (DESYREL) tablet 25 mg  25 mg Oral QHS PRN Mansy, Jan A, MD   25 mg at 08/02/23 0249   Vitamin D (Ergocalciferol) (DRISDOL) 1.25 MG (50000 UNIT) capsule 50,000 Units   50,000 Units Oral Q7 days Enedina Finner, MD   50,000 Units at 07/31/23 0908   Facility-Administered Medications Ordered in Other Encounters  Medication Dose Route Frequency Provider Last Rate Last Admin   sodium chloride flush (NS) 0.9 % injection 10 mL  10 mL Intracatheter PRN Jeralyn Ruths, MD   10 mL at 06/29/23 1430    VITAL SIGNS: BP 135/83 (BP Location: Right Arm)   Pulse (!) 101   Temp (!) 97.4 F (36.3 C) (Oral)   Resp 14  Ht 5\' 6"  (1.676 m)   Wt 170 lb (77.1 kg)   LMP  (LMP Unknown)   SpO2 100%   BMI 27.44 kg/m  Filed Weights   07/26/23 1547  Weight: 170 lb (77.1 kg)    Estimated body mass index is 27.44 kg/m as calculated from the following:   Height as of this encounter: 5\' 6"  (1.676 m).   Weight as of this encounter: 170 lb (77.1 kg).  LABS: CBC:    Component Value Date/Time   WBC 4.6 08/02/2023 0550   HGB 8.0 (L) 08/02/2023 0550   HGB 9.4 (L) 07/19/2023 1432   HCT 24.2 (L) 08/02/2023 0550   PLT 32 (L) 08/02/2023 0550   PLT 97 (L) 07/19/2023 1432   MCV 93.8 08/02/2023 0550   NEUTROABS 3.9 07/22/2023 0225   LYMPHSABS 0.4 (L) 07/22/2023 0225   MONOABS 0.1 07/22/2023 0225   EOSABS 0.1 07/22/2023 0225   BASOSABS 0.0 07/22/2023 0225   Comprehensive Metabolic Panel:    Component Value Date/Time   NA 126 (L) 08/02/2023 0550   K 4.5 08/02/2023 0550   CL 97 (L) 08/02/2023 0550   CO2 24 08/02/2023 0550   BUN 16 08/02/2023 0550   CREATININE 0.43 (L) 08/02/2023 0550   CREATININE 0.65 07/18/2023 0829   GLUCOSE 100 (H) 08/02/2023 0550   CALCIUM 7.6 (L) 08/02/2023 0550   AST 61 (H) 07/31/2023 0341   AST 106 (H) 07/18/2023 0829   ALT 99 (H) 07/31/2023 0341   ALT 113 (H) 07/18/2023 0829   ALKPHOS 173 (H) 07/31/2023 0341   BILITOT 0.6 07/31/2023 0341   BILITOT 0.5 07/18/2023 0829   PROT 4.7 (L) 07/31/2023 0341   ALBUMIN 2.4 (L) 07/31/2023 0341    RADIOGRAPHIC STUDIES: MR THORACIC SPINE W WO CONTRAST  Result Date: 07/30/2023 CLINICAL DATA:   Metastatic carcinoma of gastrointestinal primary. EXAM: MRI THORACIC WITHOUT AND WITH CONTRAST TECHNIQUE: Multiplanar and multiecho pulse sequences of the thoracic spine were obtained without and with intravenous contrast. CONTRAST:  7mL GADAVIST GADOBUTROL 1 MMOL/ML IV SOLN COMPARISON:  None Available. FINDINGS: Alignment:  Normal Vertebrae: Widespread metastatic disease throughout the marrow spaces of every vertebral body level with involvement of the vertebral bodies and posterior elements. No pathologic fracture is seen. No evidence of epidural tumor. No evidence of metastatic disease to the cord. No foraminal disease identified in the thoracic region. Cord:  No evidence of metastatic disease to the cord is self. Paraspinal and other soft tissues: Posterior rib involvement is present as well. Disc levels: Disc degeneration and bulging at T7-8. Narrowing of the ventral subarachnoid space but no compression of the cord. IMPRESSION: 1. Widespread metastatic disease throughout the marrow spaces of every vertebral body level with involvement of the vertebral bodies and posterior elements. No pathologic fracture. No evidence of epidural tumor. No evidence of metastatic disease to the cord or neural foramina. 2. Disc degeneration and bulging at T7-8 with narrowing of the ventral subarachnoid space but no compression of the cord. Electronically Signed   By: Paulina Fusi M.D.   On: 07/30/2023 21:16   MR Lumbar Spine W Wo Contrast  Result Date: 07/30/2023 CLINICAL DATA:  Metastatic adenocarcinoma of gastrointestinal source. EXAM: MRI LUMBAR SPINE WITHOUT AND WITH CONTRAST TECHNIQUE: Multiplanar and multiecho pulse sequences of the lumbar spine were obtained without and with intravenous contrast. CONTRAST:  7mL GADAVIST GADOBUTROL 1 MMOL/ML IV SOLN COMPARISON:  04/17/2022 FINDINGS: Segmentation:  5 lumbar type vertebral bodies. Alignment:  Normal Vertebrae: Metastatic disease throughout  the marrow spaces with  involvement of every vertebral level and the sacrum. Iliac bones are also diffusely involved. The spinal involvement includes vertebral body involvement and posterior element involvement. No evidence of a pathologic fracture. Some extraosseous extension of tumor along the left side of the L2 vertebral body. Early tumor involvement of the intervertebral foramina on the left at L1-2 and L2-3. Conus medullaris and cauda equina: Conus extends to the L1 level. No evidence of metastatic disease to the distal cord, conus tip or nerve roots. Paraspinal and other soft tissues: Otherwise negative Disc levels: No significant disc level pathology. IMPRESSION: 1. Widespread osseous metastatic disease throughout the spine, sacrum and iliac bones. No evidence of pathologic fracture. Some extraosseous extension of tumor along the left side of the L2 vertebral body. Early tumor involvement of the intervertebral foramina on the left at L1-2 and L2-3. 2. No evidence of metastatic disease to the distal cord, conus tip or nerve roots. Electronically Signed   By: Paulina Fusi M.D.   On: 07/30/2023 21:13   MR CERVICAL SPINE W WO CONTRAST  Result Date: 07/30/2023 CLINICAL DATA:  Metastatic disease evaluation. 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 EXAM: MRI CERVICAL SPINE WITHOUT AND WITH CONTRAST TECHNIQUE: Multiplanar and multiecho pulse sequences of the cervical spine, to include the craniocervical junction and cervicothoracic junction, were obtained without and with intravenous contrast. CONTRAST:  7mL GADAVIST GADOBUTROL 1 MMOL/ML IV SOLN COMPARISON:  04/04/2022 FINDINGS: Alignment: Straightening of the normal cervical lordosis. Vertebrae and spinal cord: Metastatic disease throughout the region affecting all of the vertebral bodies, the clivus and the posterior elements. No evidence of extraosseous tumor causing neural encroachment. No metastatic disease to the spinal cord itself. Posterior Fossa, vertebral arteries,  paraspinal tissues: Enlarged cervical lymph nodes on the left. Disc levels: Mild bulging of the disc. Involution of a previously seen herniation at C4-5. No compressive stenosis of the canal or foramina due to disc disease. IMPRESSION: 1. Widespread osseous metastatic disease throughout the cervical spine, the clivus and the posterior elements. Involvement of every vertebral level. No evidence of extraosseous tumor causing neural encroachment. No evidence of metastatic disease to the spinal cord itself. 2. Enlarged cervical lymph nodes on the left consistent with metastatic disease. 3. Involution of a previously seen disc herniation at C4-5. No compressive stenosis of the canal or foramina due to degenerative disease. Electronically Signed   By: Paulina Fusi M.D.   On: 07/30/2023 21:09   US PELVIC COMPLETE W TRANSVAGINAL AND TORSION R/O  Result Date: 07/22/2023 CLINICAL DATA:  [id number] with pelvic pain for 5 days. History of metastatic adrenal carcinoma and steadily enlarging left adnexal/pelvic mass on CT with a normal left ovary not able to be distinguished from it. There was also evidence of distal descending and proximal to mid sigmoid colitis/diverticulitis on CT today. EXAM: TRANSABDOMINAL AND TRANSVAGINAL ULTRASOUND OF PELVIS DOPPLER ULTRASOUND OF OVARIES TECHNIQUE: Both transabdominal and transvaginal ultrasound examinations of the pelvis were performed. Transabdominal technique was performed for global imaging of the pelvis including uterus, ovaries, adnexal regions, and pelvic cul-de-sac. It was necessary to proceed with endovaginal exam following the transabdominal exam to visualize the uterus, right ovary and endometrium better. Color and duplex Doppler ultrasound was utilized to evaluate blood flow to the ovaries. COMPARISON:  CT with IV contrast today, PET-CT 07/06/2023, and earlier CT studies from this year back to 12/18/2022. FINDINGS: Uterus Measurements: Anteverted and heterogeneous measuring 7.2  x 3.8 x 4.3 cm = volume: 61.2 mL.  No fibroids or other mass visualized. Incidentally noted is a 2 cm simple nabothian cysts in the cervix. Despite myometrial heterogeneity there is no appreciable focal wall mass. Endometrium Thickness: 5.7 mm.  No focal abnormality visualized. Right ovary Measurements: 3.0 x 1.4 x 2.0 = volume: 4.3 mL. Normal appearance/no adnexal mass. Left ovary There is a heterogeneous hypervascular left adnexal mass which is inseparable and indistinguishable from the left ovary. On CT this measured 5.4 x 4.8 cm. Ultrasound is 5.5 x 4.8 x 5.2 cm and 72 mL. 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 Pulsed Doppler evaluation of both ovaries demonstrates normal low-resistance arterial and venous waveforms. Other findings Mild anechoic free fluid, which was also seen on CT. IMPRESSION: Heterogeneous hypervascular left adnexal mass which is inseparable from the left ovary. 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 Right ovary, uterus and endometrium are essentially unremarkable with incidental 2 cm simple nabothian cervical cyst. Mildly heterogeneous myometrium. Mild free fluid, nonspecific. Electronically Signed   By: Almira Bar M.D.   On: 07/22/2023 06:01   CT Angio Chest PE W and/or Wo Contrast  Result Date: 07/22/2023 CLINICAL DATA:  Weakness, emesis and diarrhea for the last several days. Currently taking chemotherapy and radiation for metastatic adrenal carcinoma. EXAM: CT ANGIOGRAPHY CHEST CT ABDOMEN AND PELVIS WITH CONTRAST TECHNIQUE: Multidetector CT imaging of the chest was performed using the standard protocol  during bolus administration of intravenous contrast. Multiplanar CT image reconstructions and MIPs were obtained to evaluate the vascular anatomy. Multidetector CT imaging of the abdomen and pelvis was performed using the standard protocol during bolus administration of intravenous contrast. RADIATION DOSE REDUCTION: This exam was performed according to the departmental dose-optimization program which includes automated exposure control, adjustment of the mA and/or kV according to patient size and/or use of iterative reconstruction technique. CONTRAST:  OMNIPAQUE IOHEXOL 350 MG/ML SOLN COMPARISON:  Portable chest today, PET-CT 07/06/2023, chest, abdomen and pelvis outside CT images only dated 02/03/2023. FINDINGS: CTA CHEST FINDINGS Cardiovascular: Pulmonary arteries are normal in caliber and do not show embolic filling defects. There is minimal atherosclerosis in the aortic arch. There is no aneurysm, stenosis or dissection. The great vessels are clear.  The pulmonary veins are nondistended. There is mild cardiomegaly with a left chamber predominance. There is a right chest port with IJ approach catheter with tip in the right atrium. There is a small pericardial effusion new from 07/06/2023. No visible coronary calcific plaques. Mediastinum/Nodes: Lower paratracheal space node measuring 8 mm in short axis on 4:45 and adjacent right-sided precarinal lymph node also 8 mm short axis. Subcarinal lymph node up to 9 mm short axis. All of these with uptake on PET-CT but no bulky adenopathy. No hilar adenopathy. In the left axilla multiple enlarged lymph nodes again are noted, with multifocal PET activity. Largest of these is 1.4 x 3.3 cm on 4:17, 2 x 1.5 cm on 4:20 and 2 x 1.5 cm on 4:31. There has been no change since 07/06/2023. There is an enlarged right axillary node measuring 1.3 x 1.8 cm on 4:34. Right cardiophrenic angle lymph nodes largest of these is 9 mm in short axis on 4:95. Lungs/Pleura: Mildly  elevated  right hemidiaphragm. Unchanged 6 mm pleural-based posterior left upper lobe nodule on 5:36. Unchanged 5 mm pleural-based nodule anteriorly in the left upper lobe on 5:49. There are no other appreciable nodules. There is no consolidation, effusion or pneumothorax. 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 There are scattered thoracic spine endplate Schmorl's nodes but no acute compression fracture. Findings are more confluent and widespread than on 02/03/2023. Review of the MIP images confirms the above findings. CT ABDOMEN and PELVIS FINDINGS Hepatobiliary: No hepatic mass enhancement. Mild hepatic steatosis. Cholelithiasis without evidence of cholecystitis or biliary dilatation. Pancreas: No abnormality. Spleen: No abnormality. 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 Both kidneys enhance homogeneously. There is no urinary stone or obstruction. No bladder thickening. Stomach/Bowel: There is fluid in the colon in the ascending and transverse segments. There are increasing thickened folds in the distal descending and proximal/mid sigmoid colon and adjacent stranding consistent with colitis or diverticulitis. There is no wall pneumatosis, free air, or abscess. Vascular/Lymphatic: Small hypermetabolic lymph nodes were noted on the recent PET-CT and have not changed. Examples include an aortocaval lymph node 7 mm short axis on 11: 39, left external iliac chain node is 9 mm short axis on 11:77, with scattered subcentimeter hypermetabolic inguinal lymph nodes also recently seen. By strict size criteria, majority of the hypermetabolic nodes are not frankly enlarged. There are slightly prominent left periaortic chain nodes up to 1 cm in short axis. Large heterogeneous left adnexal/pelvic mass again measures 5.4 x 4.8 cm on 11:72. This is about double the size it was on an earlier study this year on 12/18/2022. Reproductive: The uterus is  intact. No new adnexal abnormality. 2 cm transmural fibroid of the right side of the fundus. Other: Small volume of pelvic ascites. No free hemorrhage, free air or localizing collections. 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 No visible pathologic regional fracture. Review of the MIP images confirms the above findings. IMPRESSION: 1. No evidence of arterial embolus or acute chest process. 2. Small pericardial effusion, new from 07/06/2023. 3. Cardiomegaly without evidence of CHF. 4. Unchanged 6 mm and 5 mm left upper lobe nodules. 5. Bilateral axillary adenopathy, left greater than right, with borderline to slightly prominent mediastinal, retroperitoneal and pelvic lymph nodes. Positive PET activity on recent PET-CT. 6. 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 7. Increasing thickened folds in the distal descending and proximal/mid sigmoid colon with adjacent stranding consistent with colitis or diverticulitis. No wall pneumatosis, free air, or abscess. 8. 5.4 x 4.8 cm heterogeneous left adnexal/pelvic mass almost certainly metastatic, about double the size it was on an earlier study this year on 12/18/2022. 9. Small volume of pelvic ascites.  No free air. 10. Cholelithiasis without evidence of cholecystitis. 11. Mild hepatic steatosis. Electronically Signed   By: Almira Bar M.D.   On: 07/22/2023 04:40   CT ABDOMEN PELVIS W CONTRAST  Result Date: 07/22/2023 CLINICAL DATA:  Weakness, emesis and diarrhea for the last several days. Currently taking chemotherapy and radiation for metastatic adrenal carcinoma. EXAM: CT ANGIOGRAPHY CHEST CT ABDOMEN AND PELVIS WITH CONTRAST TECHNIQUE: Multidetector CT imaging of the chest was performed using the standard protocol during bolus administration of intravenous contrast. Multiplanar CT image reconstructions and MIPs were obtained to evaluate the vascular anatomy. Multidetector CT imaging  of the abdomen and pelvis was performed using the standard protocol during bolus administration of intravenous contrast. RADIATION DOSE REDUCTION: This exam was  performed according to the departmental dose-optimization program which includes automated exposure control, adjustment of the mA and/or kV according to patient size and/or use of iterative reconstruction technique. CONTRAST:  OMNIPAQUE IOHEXOL 350 MG/ML SOLN COMPARISON:  Portable chest today, PET-CT 07/06/2023, chest, abdomen and pelvis outside CT images only dated 02/03/2023. FINDINGS: CTA CHEST FINDINGS Cardiovascular: Pulmonary arteries are normal in caliber and do not show embolic filling defects. There is minimal atherosclerosis in the aortic arch. There is no aneurysm, stenosis or dissection. The great vessels are clear.  The pulmonary veins are nondistended. There is mild cardiomegaly with a left chamber predominance. There is a right chest port with IJ approach catheter with tip in the right atrium. There is a small pericardial effusion new from 07/06/2023. No visible coronary calcific plaques. Mediastinum/Nodes: Lower paratracheal space node measuring 8 mm in short axis on 4:45 and adjacent right-sided precarinal lymph node also 8 mm short axis. Subcarinal lymph node up to 9 mm short axis. All of these with uptake on PET-CT but no bulky adenopathy. No hilar adenopathy. In the left axilla multiple enlarged lymph nodes again are noted, with multifocal PET activity. Largest of these is 1.4 x 3.3 cm on 4:17, 2 x 1.5 cm on 4:20 and 2 x 1.5 cm on 4:31. There has been no change since 07/06/2023. There is an enlarged right axillary node measuring 1.3 x 1.8 cm on 4:34. Right cardiophrenic angle lymph nodes largest of these is 9 mm in short axis on 4:95. Lungs/Pleura: Mildly elevated right hemidiaphragm. Unchanged 6 mm pleural-based posterior left upper lobe nodule on 5:36. Unchanged 5 mm pleural-based nodule anteriorly in the left upper lobe on 5:49.  There are no other appreciable nodules. There is no consolidation, effusion or pneumothorax. 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 There are scattered thoracic spine endplate Schmorl's nodes but no acute compression fracture. Findings are more confluent and widespread than on 02/03/2023. Review of the MIP images confirms the above findings. CT ABDOMEN and PELVIS FINDINGS Hepatobiliary: No hepatic mass enhancement. Mild hepatic steatosis. Cholelithiasis without evidence of cholecystitis or biliary dilatation. Pancreas: No abnormality. Spleen: No abnormality. 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 Both kidneys enhance homogeneously. There is no urinary stone or obstruction. No bladder thickening. Stomach/Bowel: There is fluid in the colon in the ascending and transverse segments. There are increasing thickened folds in the distal descending and proximal/mid sigmoid colon and adjacent stranding consistent with colitis or diverticulitis. There is no wall pneumatosis, free air, or abscess. Vascular/Lymphatic: Small hypermetabolic lymph nodes were noted on the recent PET-CT and have not changed. Examples include an aortocaval lymph node 7 mm short axis on 11: 39, left external iliac chain node is 9 mm short axis on 11:77, with scattered subcentimeter hypermetabolic inguinal lymph nodes also recently seen. By strict size criteria, majority of the hypermetabolic nodes are not frankly enlarged. There are slightly prominent left periaortic chain nodes up to 1 cm in short axis. Large heterogeneous left adnexal/pelvic mass again measures 5.4 x 4.8 cm on 11:72. This is about double the size it was on an earlier study this year on 12/18/2022. Reproductive: The uterus is intact. No new adnexal abnormality. 2 cm transmural fibroid of the right side of the fundus. Other: Small volume of pelvic ascites. No free hemorrhage, free air or  localizing collections. 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 No visible pathologic regional fracture. Review of the MIP images  confirms the above findings. IMPRESSION: 1. No evidence of arterial embolus or acute chest process. 2. Small pericardial effusion, new from 07/06/2023. 3. Cardiomegaly without evidence of CHF. 4. Unchanged 6 mm and 5 mm left upper lobe nodules. 5. Bilateral axillary adenopathy, left greater than right, with borderline to slightly prominent mediastinal, retroperitoneal and pelvic lymph nodes. Positive PET activity on recent PET-CT. 6. 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 7. Increasing thickened folds in the distal descending and proximal/mid sigmoid colon with adjacent stranding consistent with colitis or diverticulitis. No wall pneumatosis, free air, or abscess. 8. 5.4 x 4.8 cm heterogeneous left adnexal/pelvic mass almost certainly metastatic, about double the size it was on an earlier study this year on 12/18/2022. 9. Small volume of pelvic ascites.  No free air. 10. Cholelithiasis without evidence of cholecystitis. 11. Mild hepatic steatosis. Electronically Signed   By: Almira Bar M.D.   On: 07/22/2023 04:40   DG Chest Port 1 View  Result Date: 07/22/2023 CLINICAL DATA:  Generalized weakness for several days, history of adrenal carcinoma EXAM: PORTABLE CHEST 1 VIEW COMPARISON:  PET-CT from 07/06/2019 FINDINGS: Cardiac shadow is within normal limits. Lungs are well aerated bilaterally. Right chest wall port is noted. Bony structures demonstrate increased sclerosis consistent with metastatic disease. IMPRESSION: Changes consistent with bony metastatic disease. These are stable from prior PET-CT. No acute abnormality noted. Electronically Signed   By: Alcide Clever M.D.   On: 07/22/2023 02:50   NM PET Image Restag (PS) Skull Base To Thigh  Result Date: 07/15/2023 CLINICAL DATA:  Subsequent  treatment strategy for adrenal carcinoma. EXAM: NUCLEAR MEDICINE PET SKULL BASE TO THIGH TECHNIQUE: 9.6 mCi F-18 FDG was injected intravenously. Full-ring PET imaging was performed from the skull base to thigh after the radiotracer. CT data was obtained and used for attenuation correction and anatomic localization. Fasting blood glucose: 93 mg/dl COMPARISON:  46/96/2952. FINDINGS: Mediastinal blood pool activity: SUV max 2.3 Liver activity: SUV max NA NECK: Worsening cervical hypermetabolic adenopathy. Index posterior left level II/III lymph node measures 8 mm (7/25), SUV max 6.1, previously 4 mm and 2.2. Incidental CT findings: None. CHEST: Hypermetabolic mediastinal and axillary lymph nodes, progressive. Index left axillary lymph node measures 13 mm (7/47), SUV max 7.9, compared to 10 mm and 6.8. Incidental CT findings: Right IJ Port-A-Cath terminates in the right atrium. Heart is enlarged. No pericardial or pleural effusion. ABDOMEN/PELVIS: Similar hypermetabolic abdominal retroperitoneal lymph nodes. Index aortocaval lymph node measures 8 mm (7/96), SUV max 4.5. Progressive pelvic retroperitoneal lymph nodes. Index left external iliac lymph node measures 12 mm (7/134), SUV max 8.2, previously 10 mm and 2.4. Additional small hypermetabolic inguinal lymph nodes. Enlarging left adnexal mass now measures 4.5 x 5.5 cm (7/128), SUV max similar at 3.3, previously 4.4 x 4.7 cm. Incidental CT findings: Liver is grossly unremarkable. Gallstones. Adrenal glands, kidneys, spleen, pancreas, stomach and bowel are grossly unremarkable. SKELETON: Mottled sclerosis and hypermetabolism throughout the visualized osseous structures, similar to the prior exam. Index hypermetabolism in the proximal right femur, SUV max 8.3, compared to 9.3. Incidental CT findings: None. IMPRESSION: 1. Slight interval progression in metastatic disease as evidenced by enlarging and/or increasing hypermetabolic lymph nodes in the neck, chest and pelvis.  Abdominal retroperitoneal lymph nodes and osseous metastatic disease appear similar. 2. Enlarging and mildly hypermetabolic left adnexal mass is worrisome for a metastasis. Previous ultrasound 02/24/2023. 3. Cholelithiasis. Electronically Signed   By: Leanna Battles M.D.   On: 07/15/2023 13:06    PERFORMANCE  STATUS (ECOG) : 3 - Symptomatic, >50% confined to bed  Review of Systems Unless otherwise noted, a complete review of systems is negative.  Physical Exam General: NAD Cardiovascular: regular rate and rhythm Pulmonary: clear ant fields Abdomen: soft, nontender, + bowel sounds GU: no suprapubic tenderness Extremities: no edema, no joint deformities Skin: no rashes Neurological: Weakness but otherwise nonfocal  IMPRESSION: Follow-up visit.    Patient continues to require frequent dosing of IV hydromorphone (18 milligrams in past 24 hours).  Says that she removed the fentanyl patch after about an hour feeling that it was causing worsening pain.  Patient currently says pain is improved.  Appears comfortable.  Patient interested in resuming home pain regimen of tramadol ER plus Percocet.  Discussed with patient's boyfriend will bring in tramadol tomorrow morning.  If patient stable on home regimen, would recommend discharge on same.  Will not discontinue IV hydromorphone overnight in case patient requires it for extreme pain.  However, patient verbalized understanding that she will utilize Percocet in anticipation of maximizing oral medications.  PLAN: -Continue current scope of treatment -Resume home pain regimen of tramadol ER and Percocet -Will follow  Case and plan discussed with Dr. Orlie Dakin and Dr. Sherryll Burger   Time Total: 20 minutes  Visit consisted of counseling and education dealing with the complex and emotionally intense issues of symptom management and palliative care in the setting of serious and potentially life-threatening illness.Greater than 50%  of this time was spent  counseling and coordinating care related to the above assessment and plan.  Signed by: Laurette Schimke, PhD, NP-C

## 2023-08-02 NOTE — Progress Notes (Signed)
[facility]  [phone] [phone]  ID: Sheryl Silva OB: 1976-01-19  MR#: [id number]  AOZ#:[id number]  Patient Care Team: [name], MD as PCP - General (Internal Medicine) Jim Like, RN as Registered Nurse Scarlett Presto, RN (Inactive) as Registered Nurse Benita Gutter, RN as Oncology Nurse Navigator Jeralyn Ruths, MD as Consulting Physician (Oncology)  CHIEF COMPLAINT: Stage IV signet ring cell carcinoma, colitis.  INTERVAL HISTORY: Patient has essentially resolved.  He only complains of persistent pain.  Patient sitting up in bed appears comfortable.  REVIEW OF SYSTEMS:   Review of Systems  Constitutional: Negative.  Negative for fever, malaise/fatigue and weight loss.  Respiratory: Negative.  Negative for cough, hemoptysis and shortness of breath.   Cardiovascular: Negative.  Negative for chest pain and leg swelling.  Gastrointestinal: Negative.  Negative for abdominal pain and diarrhea.  Genitourinary: Negative.  Negative for dysuria.  Musculoskeletal:  Positive for back pain.  Skin: Negative.  Negative for rash.  Neurological:  Negative for dizziness, focal weakness, weakness and headaches.  Psychiatric/Behavioral: Negative.  The patient is not nervous/anxious.     As per HPI. Otherwise, a complete review of systems is negative.  PAST MEDICAL HISTORY: Past Medical History:  Diagnosis Date   Anxiety    Cancer (HCC)    Depression    Heart murmur    Hypertension     PAST SURGICAL HISTORY: Past Surgical History:  Procedure Laterality Date   BIOPSY  07/26/2023   Procedure: BIOPSY;  Surgeon: Toney Reil, MD;  Location: [facility] ENDOSCOPY;  Service: Gastroenterology;;   CESAREAN SECTION     COLONOSCOPY WITH PROPOFOL N/A 02/21/2023   Procedure: COLONOSCOPY WITH PROPOFOL;  Surgeon: Regis Bill, MD;  Location: [facility] ENDOSCOPY;  Service: Endoscopy;  Laterality: N/A;   DILATION AND CURETTAGE OF  UTERUS     FLEXIBLE SIGMOIDOSCOPY N/A 07/26/2023   Procedure: FLEXIBLE SIGMOIDOSCOPY;  Surgeon: Toney Reil, MD;  Location: [facility] ENDOSCOPY;  Service: Gastroenterology;  Laterality: N/A;   IR IMAGING GUIDED PORT INSERTION  02/16/2023    FAMILY HISTORY: Family History  Problem Relation Age of Onset   Cancer Maternal Grandmother     ADVANCED DIRECTIVES (Y/N):  @ADVDIR @  HEALTH MAINTENANCE: Social History   Tobacco Use   Smoking status: Former    Current packs/day: 0.00    Types: Cigarettes    Quit date: 12/19/2022    Years since quitting: 0.6   Smokeless tobacco: Never   Tobacco comments:    Quit smoking 12/2022  Vaping Use   Vaping status: Never Used  Substance Use Topics   Alcohol use: Yes    Comment: occ   Drug use: No     Colonoscopy:  PAP:  Bone density:  Lipid panel:  Allergies  Allergen Reactions   Nifedipine Palpitations    Current Facility-Administered Medications  Medication Dose Route Frequency Provider Last Rate Last Admin   0.9 %  sodium chloride infusion   Intravenous PRN Enedina Finner, MD   Stopped at 07/30/23 0319   acetaminophen (TYLENOL) tablet 650 mg  650 mg Oral Q6H PRN Mikey College T, MD   650 mg at 08/01/23 0328   albuterol (PROVENTIL) (2.5 MG/3ML) 0.083% nebulizer solution 2.5 mg  2.5 mg Nebulization Q4H PRN Manuela Schwartz, NP   2.5 mg at 07/24/23 2001   alum & mag hydroxide-simeth (MAALOX/MYLANTA) 200-200-20 MG/5ML suspension 30 mL  30 mL Oral Q6H PRN Delfino Lovett, MD   30 mL at 08/02/23 1226  busPIRone (BUSPAR) tablet 5 mg  5 mg Oral BID Enedina Finner, MD   5 mg at 08/02/23 1049   Chlorhexidine Gluconate Cloth 2 % PADS 6 each  6 each Topical Daily Jeralyn Ruths, MD   6 each at 08/02/23 1459   cyanocobalamin (VITAMIN B12) tablet 500 mcg  500 mcg Oral Daily Enedina Finner, MD   500 mcg at 08/02/23 1049   cyclobenzaprine (FLEXERIL) tablet 7.5 mg  7.5 mg Oral TID PRN Delfino Lovett, MD   7.5 mg at 08/02/23 0759   dicyclomine (BENTYL) tablet 20  mg  20 mg Oral TID PRN Toney Reil, MD   20 mg at 08/02/23 1050   DULoxetine (CYMBALTA) DR capsule 60 mg  60 mg Oral Daily Enedina Finner, MD   60 mg at 08/02/23 1048   feeding supplement (ENSURE ENLIVE / ENSURE PLUS) liquid 237 mL  237 mL Oral TID BM Delfino Lovett, MD   237 mL at 08/02/23 1053   fentaNYL (DURAGESIC) 50 MCG/HR 1 patch  1 patch Transdermal Q72H Borders, Daryl Eastern, NP   1 patch at 08/01/23 1724   gabapentin (NEURONTIN) capsule 300 mg  300 mg Oral TID Mikey College T, MD   300 mg at 08/02/23 1602   ganciclovir (CYTOVENE) 385 mg in sodium chloride 0.9 % 100 mL IVPB  5 mg/kg Intravenous BID Lynn Ito, MD 100 mL/hr at 08/02/23 1054 385 mg at 08/02/23 1054   HYDROmorphone (DILAUDID) injection 1-2 mg  1-2 mg Intravenous Q3H PRN Delfino Lovett, MD   2 mg at 08/02/23 1602   loperamide (IMODIUM) capsule 2 mg  2 mg Oral Q6H PRN Delfino Lovett, MD   2 mg at 07/29/23 2133   megestrol (MEGACE) tablet 40 mg  40 mg Oral Daily Mikey College T, MD   40 mg at 08/02/23 1458   multivitamin with minerals tablet 1 tablet  1 tablet Oral Daily Enedina Finner, MD   1 tablet at 08/02/23 1048   nystatin (MYCOSTATIN) 100000 UNIT/ML suspension 500,000 Units  5 mL Oral QID Jeralyn Ruths, MD   500,000 Units at 08/02/23 1456   ondansetron (ZOFRAN) injection 4 mg  4 mg Intravenous Q6H PRN Mikey College T, MD   4 mg at 07/30/23 0958   oxyCODONE-acetaminophen (PERCOCET/ROXICET) 5-325 MG per tablet 1-2 tablet  1-2 tablet Oral Q4H PRN Enedina Finner, MD   2 tablet at 07/31/23 2956   And   oxyCODONE (Oxy IR/ROXICODONE) immediate release tablet 5 mg  5 mg Oral Q4H PRN Enedina Finner, MD   5 mg at 07/28/23 0429   pantoprazole (PROTONIX) EC tablet 40 mg  40 mg Oral Daily Mikey College T, MD   40 mg at 08/02/23 1049   sodium chloride flush (NS) 0.9 % injection 10-40 mL  10-40 mL Intracatheter Q12H Jeralyn Ruths, MD   10 mL at 08/02/23 1051   sodium chloride flush (NS) 0.9 % injection 10-40 mL  10-40 mL Intracatheter PRN  Jeralyn Ruths, MD       traMADol Janean Sark) tablet 50 mg  50 mg Oral Q12H PRN Enedina Finner, MD   50 mg at 07/25/23 0250   traZODone (DESYREL) tablet 25 mg  25 mg Oral QHS PRN Mansy, Jan A, MD   25 mg at 08/02/23 0249   Vitamin D (Ergocalciferol) (DRISDOL) 1.25 MG (50000 UNIT) capsule 50,000 Units  50,000 Units Oral Q7 days Enedina Finner, MD   50,000 Units at 07/31/23 0908   Facility-Administered Medications Ordered  in Other Encounters  Medication Dose Route Frequency Provider Last Rate Last Admin   sodium chloride flush (NS) 0.9 % injection 10 mL  10 mL Intracatheter PRN Jeralyn Ruths, MD   10 mL at 06/29/23 1430    OBJECTIVE: Vitals:   08/02/23 0731 08/02/23 1528  BP: 128/80 135/83  Pulse: 96 (!) 101  Resp: 16 14  Temp: 98.6 F (37 C) (!) 97.4 F (36.3 C)  SpO2: 100% 100%     Body mass index is 27.44 kg/m.    ECOG FS:1 - Symptomatic but completely ambulatory  General: Well-developed, well-nourished, no acute distress. Eyes: Pink conjunctiva, anicteric sclera. HEENT: Normocephalic, moist mucous membranes. Lungs: No audible wheezing or coughing. Heart: Regular rate and rhythm. Abdomen: Soft, nontender, no obvious distention. Musculoskeletal: No edema, cyanosis, or clubbing. Neuro: Alert, answering all questions appropriately. Cranial nerves grossly intact. Skin: No rashes or petechiae noted. Psych: Normal affect.  LAB RESULTS:  Lab Results  Component Value Date   NA 126 (L) 08/02/2023   K 4.5 08/02/2023   CL 97 (L) 08/02/2023   CO2 24 08/02/2023   GLUCOSE 100 (H) 08/02/2023   BUN 16 08/02/2023   CREATININE 0.43 (L) 08/02/2023   CALCIUM 7.6 (L) 08/02/2023   PROT 4.7 (L) 07/31/2023   ALBUMIN 2.4 (L) 07/31/2023   AST 61 (H) 07/31/2023   ALT 99 (H) 07/31/2023   ALKPHOS 173 (H) 07/31/2023   BILITOT 0.6 07/31/2023   GFRNONAA >60 08/02/2023   GFRAA >60 09/27/2018    Lab Results  Component Value Date   WBC 4.6 08/02/2023   NEUTROABS 3.9 07/22/2023   HGB 8.0  (L) 08/02/2023   HCT 24.2 (L) 08/02/2023   MCV 93.8 08/02/2023   PLT 32 (L) 08/02/2023     STUDIES: MR THORACIC SPINE W WO CONTRAST  Result Date: 07/30/2023 CLINICAL DATA:  Metastatic carcinoma of gastrointestinal primary. EXAM: MRI THORACIC WITHOUT AND WITH CONTRAST TECHNIQUE: Multiplanar and multiecho pulse sequences of the thoracic spine were obtained without and with intravenous contrast. CONTRAST:  7mL GADAVIST GADOBUTROL 1 MMOL/ML IV SOLN COMPARISON:  None Available. FINDINGS: Alignment:  Normal Vertebrae: Widespread metastatic disease throughout the marrow spaces of every vertebral body level with involvement of the vertebral bodies and posterior elements. No pathologic fracture is seen. No evidence of epidural tumor. No evidence of metastatic disease to the cord. No foraminal disease identified in the thoracic region. Cord:  No evidence of metastatic disease to the cord is self. Paraspinal and other soft tissues: Posterior rib involvement is present as well. Disc levels: Disc degeneration and bulging at T7-8. Narrowing of the ventral subarachnoid space but no compression of the cord. IMPRESSION: 1. Widespread metastatic disease throughout the marrow spaces of every vertebral body level with involvement of the vertebral bodies and posterior elements. No pathologic fracture. No evidence of epidural tumor. No evidence of metastatic disease to the cord or neural foramina. 2. Disc degeneration and bulging at T7-8 with narrowing of the ventral subarachnoid space but no compression of the cord. Electronically Signed   By: Paulina Fusi M.D.   On: 07/30/2023 21:16   MR Lumbar Spine W Wo Contrast  Result Date: 07/30/2023 CLINICAL DATA:  Metastatic adenocarcinoma of gastrointestinal source. EXAM: MRI LUMBAR SPINE WITHOUT AND WITH CONTRAST TECHNIQUE: Multiplanar and multiecho pulse sequences of the lumbar spine were obtained without and with intravenous contrast. CONTRAST:  7mL GADAVIST GADOBUTROL 1  MMOL/ML IV SOLN COMPARISON:  04/17/2022 FINDINGS: Segmentation:  5 lumbar type vertebral bodies. Alignment:  Normal Vertebrae: Metastatic disease throughout the marrow spaces with involvement of every vertebral level and the sacrum. Iliac bones are also diffusely involved. The spinal involvement includes vertebral body involvement and posterior element involvement. No evidence of a pathologic fracture. Some extraosseous extension of tumor along the left side of the L2 vertebral body. Early tumor involvement of the intervertebral foramina on the left at L1-2 and L2-3. Conus medullaris and cauda equina: Conus extends to the L1 level. No evidence of metastatic disease to the distal cord, conus tip or nerve roots. Paraspinal and other soft tissues: Otherwise negative Disc levels: No significant disc level pathology. IMPRESSION: 1. Widespread osseous metastatic disease throughout the spine, sacrum and iliac bones. No evidence of pathologic fracture. Some extraosseous extension of tumor along the left side of the L2 vertebral body. Early tumor involvement of the intervertebral foramina on the left at L1-2 and L2-3. 2. No evidence of metastatic disease to the distal cord, conus tip or nerve roots. Electronically Signed   By: Paulina Fusi M.D.   On: 07/30/2023 21:13   MR CERVICAL SPINE W WO CONTRAST  Result Date: 07/30/2023 CLINICAL DATA:  Metastatic disease evaluation. 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 EXAM: MRI CERVICAL SPINE WITHOUT AND WITH CONTRAST TECHNIQUE: Multiplanar and multiecho pulse sequences of the cervical spine, to include the craniocervical junction and cervicothoracic junction, were obtained without and with intravenous contrast. CONTRAST:  7mL GADAVIST GADOBUTROL 1 MMOL/ML IV SOLN COMPARISON:  04/04/2022 FINDINGS: Alignment: Straightening of the normal cervical lordosis. Vertebrae and spinal cord: Metastatic disease throughout the region affecting all of the vertebral bodies, the  clivus and the posterior elements. No evidence of extraosseous tumor causing neural encroachment. No metastatic disease to the spinal cord itself. Posterior Fossa, vertebral arteries, paraspinal tissues: Enlarged cervical lymph nodes on the left. Disc levels: Mild bulging of the disc. Involution of a previously seen herniation at C4-5. No compressive stenosis of the canal or foramina due to disc disease. IMPRESSION: 1. Widespread osseous metastatic disease throughout the cervical spine, the clivus and the posterior elements. Involvement of every vertebral level. No evidence of extraosseous tumor causing neural encroachment. No evidence of metastatic disease to the spinal cord itself. 2. Enlarged cervical lymph nodes on the left consistent with metastatic disease. 3. Involution of a previously seen disc herniation at C4-5. No compressive stenosis of the canal or foramina due to degenerative disease. Electronically Signed   By: Paulina Fusi M.D.   On: 07/30/2023 21:09   US PELVIC COMPLETE W TRANSVAGINAL AND TORSION R/O  Result Date: 07/22/2023 CLINICAL DATA:  [id number] with pelvic pain for 5 days. History of metastatic adrenal carcinoma and steadily enlarging left adnexal/pelvic mass on CT with a normal left ovary not able to be distinguished from it. There was also evidence of distal descending and proximal to mid sigmoid colitis/diverticulitis on CT today. EXAM: TRANSABDOMINAL AND TRANSVAGINAL ULTRASOUND OF PELVIS DOPPLER ULTRASOUND OF OVARIES TECHNIQUE: Both transabdominal and transvaginal ultrasound examinations of the pelvis were performed. Transabdominal technique was performed for global imaging of the pelvis including uterus, ovaries, adnexal regions, and pelvic cul-de-sac. It was necessary to proceed with endovaginal exam following the transabdominal exam to visualize the uterus, right ovary and endometrium better. Color and duplex Doppler ultrasound was utilized to evaluate blood flow to the ovaries.  COMPARISON:  CT with IV contrast today, PET-CT 07/06/2023, and earlier CT studies from this year back to 12/18/2022. FINDINGS: Uterus Measurements: Anteverted and heterogeneous measuring 7.2 x 3.8 x 4.3 cm =  volume: 61.2 mL. No fibroids or other mass visualized. Incidentally noted is a 2 cm simple nabothian cysts in the cervix. Despite myometrial heterogeneity there is no appreciable focal wall mass. Endometrium Thickness: 5.7 mm.  No focal abnormality visualized. Right ovary Measurements: 3.0 x 1.4 x 2.0 = volume: 4.3 mL. Normal appearance/no adnexal mass. Left ovary There is a heterogeneous hypervascular left adnexal mass which is inseparable and indistinguishable from the left ovary. On CT this measured 5.4 x 4.8 cm. Ultrasound is 5.5 x 4.8 x 5.2 cm and 72 mL. 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 Pulsed Doppler evaluation of both ovaries demonstrates normal low-resistance arterial and venous waveforms. Other findings Mild anechoic free fluid, which was also seen on CT. IMPRESSION: Heterogeneous hypervascular left adnexal mass which is inseparable from the left ovary. 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 Right ovary, uterus and endometrium are essentially unremarkable with incidental 2 cm simple nabothian cervical cyst. Mildly heterogeneous myometrium. Mild free fluid, nonspecific. Electronically Signed   By: Almira Bar M.D.   On: 07/22/2023 06:01   CT Angio Chest PE W and/or Wo Contrast  Result Date: 07/22/2023 CLINICAL DATA:  Weakness, emesis and diarrhea for the last several days. Currently taking chemotherapy and radiation  for metastatic adrenal carcinoma. EXAM: CT ANGIOGRAPHY CHEST CT ABDOMEN AND PELVIS WITH CONTRAST TECHNIQUE: Multidetector CT imaging of the chest was performed using the standard protocol during bolus administration of intravenous contrast. Multiplanar CT image reconstructions and MIPs were obtained to evaluate the vascular anatomy. Multidetector CT imaging of the abdomen and pelvis was performed using the standard protocol during bolus administration of intravenous contrast. RADIATION DOSE REDUCTION: This exam was performed according to the departmental dose-optimization program which includes automated exposure control, adjustment of the mA and/or kV according to patient size and/or use of iterative reconstruction technique. CONTRAST:  OMNIPAQUE IOHEXOL 350 MG/ML SOLN COMPARISON:  Portable chest today, PET-CT 07/06/2023, chest, abdomen and pelvis outside CT images only dated 02/03/2023. FINDINGS: CTA CHEST FINDINGS Cardiovascular: Pulmonary arteries are normal in caliber and do not show embolic filling defects. There is minimal atherosclerosis in the aortic arch. There is no aneurysm, stenosis or dissection. The great vessels are clear.  The pulmonary veins are nondistended. There is mild cardiomegaly with a left chamber predominance. There is a right chest port with IJ approach catheter with tip in the right atrium. There is a small pericardial effusion new from 07/06/2023. No visible coronary calcific plaques. Mediastinum/Nodes: Lower paratracheal space node measuring 8 mm in short axis on 4:45 and adjacent right-sided precarinal lymph node also 8 mm short axis. Subcarinal lymph node up to 9 mm short axis. All of these with uptake on PET-CT but no bulky adenopathy. No hilar adenopathy. In the left axilla multiple enlarged lymph nodes again are noted, with multifocal PET activity. Largest of these is 1.4 x 3.3 cm on 4:17, 2 x 1.5 cm on 4:20 and 2 x 1.5 cm on 4:31. There has been no change since 07/06/2023.  There is an enlarged right axillary node measuring 1.3 x 1.8 cm on 4:34. Right cardiophrenic angle lymph nodes largest of these is 9 mm in short axis  on 4:95. Lungs/Pleura: Mildly elevated right hemidiaphragm. Unchanged 6 mm pleural-based posterior left upper lobe nodule on 5:36. Unchanged 5 mm pleural-based nodule anteriorly in the left upper lobe on 5:49. There are no other appreciable nodules. There is no consolidation, effusion or pneumothorax. 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 There are scattered thoracic spine endplate Schmorl's nodes but no acute compression fracture. Findings are more confluent and widespread than on 02/03/2023. Review of the MIP images confirms the above findings. CT ABDOMEN and PELVIS FINDINGS Hepatobiliary: No hepatic mass enhancement. Mild hepatic steatosis. Cholelithiasis without evidence of cholecystitis or biliary dilatation. Pancreas: No abnormality. Spleen: No abnormality. 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 Both kidneys enhance homogeneously. There is no urinary stone or obstruction. No bladder thickening. Stomach/Bowel: There is fluid in the colon in the ascending and transverse segments. There are increasing thickened folds in the distal descending and proximal/mid sigmoid colon and adjacent stranding consistent with colitis or diverticulitis. There is no wall pneumatosis, free air, or abscess. Vascular/Lymphatic: Small hypermetabolic lymph nodes were noted on the recent PET-CT and have not changed. Examples include an aortocaval lymph node 7 mm short axis on 11: 39, left external iliac chain node is 9 mm short axis on 11:77, with scattered subcentimeter hypermetabolic inguinal lymph nodes also recently seen. By strict size criteria, majority of the hypermetabolic nodes are not frankly enlarged. There are slightly prominent left periaortic chain nodes up to 1 cm in short axis.  Large heterogeneous left adnexal/pelvic mass again measures 5.4 x 4.8 cm on 11:72. This is about double the size it was on an earlier study this year on 12/18/2022. Reproductive: The uterus is intact. No new adnexal abnormality. 2 cm transmural fibroid of the right side of the fundus. Other: Small volume of pelvic ascites. No free hemorrhage, free air or localizing collections. 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 No visible pathologic regional fracture. Review of the MIP images confirms the above findings. IMPRESSION: 1. No evidence of arterial embolus or acute chest process. 2. Small pericardial effusion, new from 07/06/2023. 3. Cardiomegaly without evidence of CHF. 4. Unchanged 6 mm and 5 mm left upper lobe nodules. 5. Bilateral axillary adenopathy, left greater than right, with borderline to slightly prominent mediastinal, retroperitoneal and pelvic lymph nodes. Positive PET activity on recent PET-CT. 6. 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 7. Increasing thickened folds in the distal descending and proximal/mid sigmoid colon with adjacent stranding consistent with colitis or diverticulitis. No wall pneumatosis, free air, or abscess. 8. 5.4 x 4.8 cm heterogeneous left adnexal/pelvic mass almost certainly metastatic, about double the size it was on an earlier study this year on 12/18/2022. 9. Small volume of pelvic ascites.  No free air. 10. Cholelithiasis without evidence of cholecystitis. 11. Mild hepatic steatosis. Electronically Signed   By: Almira Bar M.D.   On: 07/22/2023 04:40   CT ABDOMEN PELVIS W CONTRAST  Result Date: 07/22/2023 CLINICAL DATA:  Weakness, emesis and diarrhea for the last several days. Currently taking chemotherapy and radiation for metastatic adrenal carcinoma. EXAM: CT ANGIOGRAPHY CHEST CT ABDOMEN AND PELVIS WITH CONTRAST TECHNIQUE: Multidetector CT imaging of the chest was performed using  the standard protocol during bolus administration of intravenous contrast. Multiplanar CT image reconstructions and MIPs were obtained to evaluate the vascular anatomy. Multidetector CT imaging of the abdomen and pelvis was performed using the standard protocol during bolus administration of intravenous contrast. RADIATION DOSE REDUCTION:  This exam was performed according to the departmental dose-optimization program which includes automated exposure control, adjustment of the mA and/or kV according to patient size and/or use of iterative reconstruction technique. CONTRAST:  OMNIPAQUE IOHEXOL 350 MG/ML SOLN COMPARISON:  Portable chest today, PET-CT 07/06/2023, chest, abdomen and pelvis outside CT images only dated 02/03/2023. FINDINGS: CTA CHEST FINDINGS Cardiovascular: Pulmonary arteries are normal in caliber and do not show embolic filling defects. There is minimal atherosclerosis in the aortic arch. There is no aneurysm, stenosis or dissection. The great vessels are clear.  The pulmonary veins are nondistended. There is mild cardiomegaly with a left chamber predominance. There is a right chest port with IJ approach catheter with tip in the right atrium. There is a small pericardial effusion new from 07/06/2023. No visible coronary calcific plaques. Mediastinum/Nodes: Lower paratracheal space node measuring 8 mm in short axis on 4:45 and adjacent right-sided precarinal lymph node also 8 mm short axis. Subcarinal lymph node up to 9 mm short axis. All of these with uptake on PET-CT but no bulky adenopathy. No hilar adenopathy. In the left axilla multiple enlarged lymph nodes again are noted, with multifocal PET activity. Largest of these is 1.4 x 3.3 cm on 4:17, 2 x 1.5 cm on 4:20 and 2 x 1.5 cm on 4:31. There has been no change since 07/06/2023. There is an enlarged right axillary node measuring 1.3 x 1.8 cm on 4:34. Right cardiophrenic angle lymph nodes largest of these is 9 mm in short axis on 4:95.  Lungs/Pleura: Mildly elevated right hemidiaphragm. Unchanged 6 mm pleural-based posterior left upper lobe nodule on 5:36. Unchanged 5 mm pleural-based nodule anteriorly in the left upper lobe on 5:49. There are no other appreciable nodules. There is no consolidation, effusion or pneumothorax. 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 There are scattered thoracic spine endplate Schmorl's nodes but no acute compression fracture. Findings are more confluent and widespread than on 02/03/2023. Review of the MIP images confirms the above findings. CT ABDOMEN and PELVIS FINDINGS Hepatobiliary: No hepatic mass enhancement. Mild hepatic steatosis. Cholelithiasis without evidence of cholecystitis or biliary dilatation. Pancreas: No abnormality. Spleen: No abnormality. 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 Both kidneys enhance homogeneously. There is no urinary stone or obstruction. No bladder thickening. Stomach/Bowel: There is fluid in the colon in the ascending and transverse segments. There are increasing thickened folds in the distal descending and proximal/mid sigmoid colon and adjacent stranding consistent with colitis or diverticulitis. There is no wall pneumatosis, free air, or abscess. Vascular/Lymphatic: Small hypermetabolic lymph nodes were noted on the recent PET-CT and have not changed. Examples include an aortocaval lymph node 7 mm short axis on 11: 39, left external iliac chain node is 9 mm short axis on 11:77, with scattered subcentimeter hypermetabolic inguinal lymph nodes also recently seen. By strict size criteria, majority of the hypermetabolic nodes are not frankly enlarged. There are slightly prominent left periaortic chain nodes up to 1 cm in short axis. Large heterogeneous left adnexal/pelvic mass again measures 5.4 x 4.8 cm on 11:72. This is about double the size it was on an earlier study this year on  12/18/2022. Reproductive: The uterus is intact. No new adnexal abnormality. 2 cm transmural fibroid of the right side of the fundus. Other: Small volume of pelvic ascites. No free hemorrhage, free air or localizing collections. 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 No visible pathologic regional fracture. Review of  the MIP images confirms the above findings. IMPRESSION: 1. No evidence of arterial embolus or acute chest process. 2. Small pericardial effusion, new from 07/06/2023. 3. Cardiomegaly without evidence of CHF. 4. Unchanged 6 mm and 5 mm left upper lobe nodules. 5. Bilateral axillary adenopathy, left greater than right, with borderline to slightly prominent mediastinal, retroperitoneal and pelvic lymph nodes. Positive PET activity on recent PET-CT. 6. 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 7. Increasing thickened folds in the distal descending and proximal/mid sigmoid colon with adjacent stranding consistent with colitis or diverticulitis. No wall pneumatosis, free air, or abscess. 8. 5.4 x 4.8 cm heterogeneous left adnexal/pelvic mass almost certainly metastatic, about double the size it was on an earlier study this year on 12/18/2022. 9. Small volume of pelvic ascites.  No free air. 10. Cholelithiasis without evidence of cholecystitis. 11. Mild hepatic steatosis. Electronically Signed   By: Almira Bar M.D.   On: 07/22/2023 04:40   DG Chest Port 1 View  Result Date: 07/22/2023 CLINICAL DATA:  Generalized weakness for several days, history of adrenal carcinoma EXAM: PORTABLE CHEST 1 VIEW COMPARISON:  PET-CT from 07/06/2019 FINDINGS: Cardiac shadow is within normal limits. Lungs are well aerated bilaterally. Right chest wall port is noted. Bony structures demonstrate increased sclerosis consistent with metastatic disease. IMPRESSION: Changes consistent with bony metastatic disease. These are stable from prior  PET-CT. No acute abnormality noted. Electronically Signed   By: Alcide Clever M.D.   On: 07/22/2023 02:50   NM PET Image Restag (PS) Skull Base To Thigh  Result Date: 07/15/2023 CLINICAL DATA:  Subsequent treatment strategy for adrenal carcinoma. EXAM: NUCLEAR MEDICINE PET SKULL BASE TO THIGH TECHNIQUE: 9.6 mCi F-18 FDG was injected intravenously. Full-ring PET imaging was performed from the skull base to thigh after the radiotracer. CT data was obtained and used for attenuation correction and anatomic localization. Fasting blood glucose: 93 mg/dl COMPARISON:  44/12/4740. FINDINGS: Mediastinal blood pool activity: SUV max 2.3 Liver activity: SUV max NA NECK: Worsening cervical hypermetabolic adenopathy. Index posterior left level II/III lymph node measures 8 mm (7/25), SUV max 6.1, previously 4 mm and 2.2. Incidental CT findings: None. CHEST: Hypermetabolic mediastinal and axillary lymph nodes, progressive. Index left axillary lymph node measures 13 mm (7/47), SUV max 7.9, compared to 10 mm and 6.8. Incidental CT findings: Right IJ Port-A-Cath terminates in the right atrium. Heart is enlarged. No pericardial or pleural effusion. ABDOMEN/PELVIS: Similar hypermetabolic abdominal retroperitoneal lymph nodes. Index aortocaval lymph node measures 8 mm (7/96), SUV max 4.5. Progressive pelvic retroperitoneal lymph nodes. Index left external iliac lymph node measures 12 mm (7/134), SUV max 8.2, previously 10 mm and 2.4. Additional small hypermetabolic inguinal lymph nodes. Enlarging left adnexal mass now measures 4.5 x 5.5 cm (7/128), SUV max similar at 3.3, previously 4.4 x 4.7 cm. Incidental CT findings: Liver is grossly unremarkable. Gallstones. Adrenal glands, kidneys, spleen, pancreas, stomach and bowel are grossly unremarkable. SKELETON: Mottled sclerosis and hypermetabolism throughout the visualized osseous structures, similar to the prior exam. Index hypermetabolism in the proximal right femur, SUV max 8.3,  compared to 9.3. Incidental CT findings: None. IMPRESSION: 1. Slight interval progression in metastatic disease as evidenced by enlarging and/or increasing hypermetabolic lymph nodes in the neck, chest and pelvis. Abdominal retroperitoneal lymph nodes and osseous metastatic disease appear similar. 2. Enlarging and mildly hypermetabolic left adnexal mass is worrisome for a metastasis. Previous ultrasound 02/24/2023. 3. Cholelithiasis. Electronically Signed   By: Leanna Battles M.D.   On: 07/15/2023 13:06  ASSESSMENT: Stage IV signet ring cell carcinoma, colitis.  PLAN:    Stage IV signet ring cell carcinoma: Patient last received chemotherapy and immunotherapy with FOLFOX plus Keytruda on July 18, 2023.  Her next treatment is not scheduled until August 08, 2023.  Colitis does not appear to be related to [facility].  She has been instructed keep this follow-up as scheduled. CMV colitis: Flex sigmoidoscopy results noted.  Appreciate GI and ID input.  Patient's symptoms have resolved.  She is being transitioned off of IV ganciclovir and will be given p.o. 900 mg valganciclovir twice a day for 4 weeks.     Hyponatremia: Sodium is trended down to 126, monitor.   Anemia: Hemoglobin 8.0.  Transfuse if falls below 7.0. Thrombocytopenia: Platelet count decreased, but stable at 32.  Transfuse if she falls below 20. Pain: Appreciate palliative care input.  Plan is to transition patient to her home pain regimen and then discontinue Dilaudid. Disposition: Okay to discharge to home from an oncology standpoint once patient transition to all p.o. medications.   Appreciate consult, will follow.  Jeralyn Ruths, MD   08/02/2023 5:03 PM

## 2023-08-03 DIAGNOSIS — A0839 Other viral enteritis: Secondary | ICD-10-CM | POA: Diagnosis not present

## 2023-08-03 DIAGNOSIS — B259 Cytomegaloviral disease, unspecified: Secondary | ICD-10-CM | POA: Diagnosis not present

## 2023-08-03 DIAGNOSIS — K529 Noninfective gastroenteritis and colitis, unspecified: Secondary | ICD-10-CM | POA: Diagnosis not present

## 2023-08-03 DIAGNOSIS — Z515 Encounter for palliative care: Secondary | ICD-10-CM | POA: Diagnosis not present

## 2023-08-03 LAB — BASIC METABOLIC PANEL
Anion gap: 6 (ref 5–15)
BUN: 10 mg/dL (ref 6–20)
CO2: 25 mmol/L (ref 22–32)
Calcium: 7.3 mg/dL — ABNORMAL LOW (ref 8.9–10.3)
Chloride: 99 mmol/L (ref 98–111)
Creatinine, Ser: 0.64 mg/dL (ref 0.44–1.00)
GFR, Estimated: >60 mL/min (ref >60)
Glucose, Bld: 99 mg/dL (ref 70–99)
Potassium: 4.5 mmol/L (ref 3.5–5.1)
Sodium: 130 mmol/L — ABNORMAL LOW (ref 135–145)

## 2023-08-03 MED ORDER — VALGANCICLOVIR HCL 450 MG PO TABS
900.0000 mg | ORAL_TABLET | Freq: Two times a day (BID) | ORAL | Status: DC
  Filled 2023-08-03: qty 2

## 2023-08-03 MED ORDER — VALGANCICLOVIR HCL 450 MG PO TABS
900.0000 mg | ORAL_TABLET | Freq: Two times a day (BID) | ORAL | Status: DC
  Administered 2023-08-03 (×2): 900 mg via ORAL
  Filled 2023-08-03 (×4): qty 2

## 2023-08-03 MED ORDER — TRAMADOL HCL ER 300 MG PO TB24
300.0000 mg | ORAL_TABLET | Freq: Every day | ORAL | Status: DC
  Administered 2023-08-03 – 2023-08-04 (×2): 300 mg via ORAL
  Filled 2023-08-03 (×2): qty 1

## 2023-08-03 NOTE — Plan of Care (Signed)
  Problem: Education: Goal: Knowledge of General Education information will improve Description Including pain rating scale, medication(s)/side effects and non-pharmacologic comfort measures Outcome: Progressing   Problem: Health Behavior/Discharge Planning: Goal: Ability to manage health-related needs will improve Outcome: Progressing   

## 2023-08-03 NOTE — Progress Notes (Addendum)
Mobility Specialist - Progress Note   08/03/23 1625  Mobility  Activity Ambulated with assistance in room  Level of Assistance Standby assist, set-up cues, supervision of patient - no hands on  Assistive Device Front wheel walker  Distance Ambulated (ft) 320 ft  Activity Response Tolerated well  $Mobility charge 1 Mobility  Mobility Specialist Start Time (ACUTE ONLY) 1608  Mobility Specialist Stop Time (ACUTE ONLY) 1616  Mobility Specialist Time Calculation (min) (ACUTE ONLY) 8 min   Pt supine upon entry, utilizing RA. Pt expressed gen weakness, however motivated and willing to amb this date. Pt completed bed mob and STS to RW indep. Pt amb two laps around the the NS SBA, intermittent scissor gait when turning head L/R during amb. Some SOB noted upon return to the room, O2 > 90%. Pt returned to the room, left supine with needs within reach. RN present at bedside upon MS exit.   Zetta Bills Mobility Specialist 08/03/23 4:30 PM

## 2023-08-03 NOTE — Progress Notes (Signed)
Palliative Medicine [facility] at [facility] Telephone:([phone]) [phone] [phone]   Name: Sheryl Silva Date: 08/03/2023 MRN: [id number]  DOB: September 21, 1976  Patient Care Team: [name], MD as PCP - General (Internal Medicine) Jim Like, RN as Registered Nurse Scarlett Presto, RN (Inactive) as Registered Nurse Benita Gutter, RN as Oncology Nurse Navigator Jeralyn Ruths, MD as Consulting Physician (Oncology)    REASON FOR CONSULTATION: Sheryl Silva is a 47 y.o. 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  Patient has been on regimen of FOLFOX plus Keytruda.  She was admitted to the hospital 07/22/2023 with abdominal pain.  CT of the abdomen and pelvis showed progressive mediastinal, retroperitoneal, and pelvic lymphadenopathy, diffuse osteoblastic metastatic disease, and left adnexal pelvic mass.  Patient also with findings of colitis.  Palliative care was consulted to address goals and manage ongoing symptoms.   CODE STATUS: Full code  PAST MEDICAL HISTORY: Past Medical History:  Diagnosis Date   Anxiety    Cancer (HCC)    Depression    Heart murmur    Hypertension     PAST SURGICAL HISTORY:  Past Surgical History:  Procedure Laterality Date   BIOPSY  07/26/2023   Procedure: BIOPSY;  Surgeon: Toney Reil, MD;  Location: [facility] ENDOSCOPY;  Service: Gastroenterology;;   CESAREAN SECTION     COLONOSCOPY WITH PROPOFOL N/A 02/21/2023   Procedure: COLONOSCOPY WITH PROPOFOL;  Surgeon: Regis Bill, MD;  Location: [facility] ENDOSCOPY;  Service: Endoscopy;  Laterality: N/A;   DILATION AND CURETTAGE OF UTERUS     FLEXIBLE SIGMOIDOSCOPY N/A 07/26/2023   Procedure: FLEXIBLE SIGMOIDOSCOPY;  Surgeon: Toney Reil, MD;  Location: [facility] ENDOSCOPY;  Service: Gastroenterology;  Laterality: N/A;   IR IMAGING GUIDED PORT INSERTION  02/16/2023     HEMATOLOGY/ONCOLOGY HISTORY:  Oncology History  Signet ring cell adenocarcinoma (HCC)  02/07/2023 Cancer Staging   Staging form: Exocrine Pancreas, AJCC 8th Edition - Clinical stage from 02/07/2023: Stage IV (cTX, cNX, pM1) - Signed by Jeralyn Ruths, MD on 02/14/2023 Stage prefix: Initial diagnosis   02/14/2023 Initial Diagnosis   Signet ring cell adenocarcinoma   03/02/2023 -  Chemotherapy   Patient is on Treatment Plan : GI ORIGIN FOLFOX+KEYTRUDA q21d x12 followed by Gwenlyn Fudge       ALLERGIES:  is allergic to nifedipine.  MEDICATIONS:  Current Facility-Administered Medications  Medication Dose Route Frequency Provider Last Rate Last Admin   0.9 %  sodium chloride infusion   Intravenous PRN Enedina Finner, MD   Stopped at 07/30/23 0319   acetaminophen (TYLENOL) tablet 650 mg  650 mg Oral Q6H PRN Mikey College T, MD   650 mg at 08/01/23 0328   albuterol (PROVENTIL) (2.5 MG/3ML) 0.083% nebulizer solution 2.5 mg  2.5 mg Nebulization Q4H PRN Manuela Schwartz, NP   2.5 mg at 07/24/23 2001   alum & mag hydroxide-simeth (MAALOX/MYLANTA) 200-200-20 MG/5ML suspension 30 mL  30 mL Oral Q6H PRN Delfino Lovett, MD   30 mL at 08/02/23 1226   busPIRone (BUSPAR) tablet 5 mg  5 mg Oral BID Enedina Finner, MD   5 mg at 08/03/23 1101   Chlorhexidine Gluconate Cloth 2 % PADS 6 each  6 each Topical Daily Jeralyn Ruths, MD   6 each at 08/03/23 1109   cyanocobalamin (VITAMIN B12) tablet 500 mcg  500 mcg Oral Daily Enedina Finner, MD   500 mcg at 08/03/23 1101  cyclobenzaprine (FLEXERIL) tablet 7.5 mg  7.5 mg Oral TID PRN Delfino Lovett, MD   7.5 mg at 08/03/23 1104   dicyclomine (BENTYL) tablet 20 mg  20 mg Oral TID PRN Toney Reil, MD   20 mg at 08/02/23 1050   DULoxetine (CYMBALTA) DR capsule 60 mg  60 mg Oral Daily Enedina Finner, MD   60 mg at 08/03/23 1102   feeding supplement (ENSURE ENLIVE / ENSURE PLUS) liquid 237 mL  237 mL Oral TID BM Delfino Lovett, MD   237 mL at 08/03/23 1112    fentaNYL (DURAGESIC) 50 MCG/HR 1 patch  1 patch Transdermal Q72H Daven Montz, Daryl Eastern, NP   1 patch at 08/01/23 1724   gabapentin (NEURONTIN) capsule 300 mg  300 mg Oral TID Mikey College T, MD   300 mg at 08/03/23 1102   HYDROmorphone (DILAUDID) injection 1-2 mg  1-2 mg Intravenous Q3H PRN Delfino Lovett, MD   2 mg at 08/02/23 2111   loperamide (IMODIUM) capsule 2 mg  2 mg Oral Q6H PRN Delfino Lovett, MD   2 mg at 07/29/23 2133   megestrol (MEGACE) tablet 40 mg  40 mg Oral Daily Mikey College T, MD   40 mg at 08/03/23 1103   multivitamin with minerals tablet 1 tablet  1 tablet Oral Daily Enedina Finner, MD   1 tablet at 08/03/23 1102   nystatin (MYCOSTATIN) 100000 UNIT/ML suspension 500,000 Units  5 mL Oral QID Jeralyn Ruths, MD   500,000 Units at 08/03/23 1102   ondansetron (ZOFRAN) injection 4 mg  4 mg Intravenous Q6H PRN Mikey College T, MD   4 mg at 07/30/23 0958   oxyCODONE-acetaminophen (PERCOCET/ROXICET) 5-325 MG per tablet 1-2 tablet  1-2 tablet Oral Q4H PRN Enedina Finner, MD   2 tablet at 08/03/23 1101   And   oxyCODONE (Oxy IR/ROXICODONE) immediate release tablet 5 mg  5 mg Oral Q4H PRN Enedina Finner, MD   5 mg at 08/03/23 0607   pantoprazole (PROTONIX) EC tablet 40 mg  40 mg Oral Daily Mikey College T, MD   40 mg at 08/03/23 1101   sodium chloride flush (NS) 0.9 % injection 10-40 mL  10-40 mL Intracatheter Q12H Jeralyn Ruths, MD   10 mL at 08/03/23 1105   sodium chloride flush (NS) 0.9 % injection 10-40 mL  10-40 mL Intracatheter PRN Jeralyn Ruths, MD       traMADol Janean Sark) tablet 50 mg  50 mg Oral Q12H PRN Enedina Finner, MD   50 mg at 08/03/23 0749   traZODone (DESYREL) tablet 25 mg  25 mg Oral QHS PRN Mansy, Jan A, MD   25 mg at 08/02/23 0249   valGANciclovir (VALCYTE) 450 MG tablet TABS 900 mg  900 mg Oral BID PC Lowella Bandy, RPH   900 mg at 08/03/23 1103   Vitamin D (Ergocalciferol) (DRISDOL) 1.25 MG (50000 UNIT) capsule 50,000 Units  50,000 Units Oral Q7 days Enedina Finner, MD   50,000  Units at 07/31/23 0908   Facility-Administered Medications Ordered in Other Encounters  Medication Dose Route Frequency Provider Last Rate Last Admin   sodium chloride flush (NS) 0.9 % injection 10 mL  10 mL Intracatheter PRN Jeralyn Ruths, MD   10 mL at 06/29/23 1430    VITAL SIGNS: BP 121/78 (BP Location: Right Arm)   Pulse (!) 108   Temp 98 F (36.7 C)   Resp 18   Ht 5\' 6"  (1.676 m)  Wt 170 lb (77.1 kg)   LMP  (LMP Unknown)   SpO2 99%   BMI 27.44 kg/m  Filed Weights   07/26/23 1547  Weight: 170 lb (77.1 kg)    Estimated body mass index is 27.44 kg/m as calculated from the following:   Height as of this encounter: 5\' 6"  (1.676 m).   Weight as of this encounter: 170 lb (77.1 kg).  LABS: CBC:    Component Value Date/Time   WBC 4.6 08/02/2023 0550   HGB 8.0 (L) 08/02/2023 0550   HGB 9.4 (L) 07/19/2023 1432   HCT 24.2 (L) 08/02/2023 0550   PLT 32 (L) 08/02/2023 0550   PLT 97 (L) 07/19/2023 1432   MCV 93.8 08/02/2023 0550   NEUTROABS 3.9 07/22/2023 0225   LYMPHSABS 0.4 (L) 07/22/2023 0225   MONOABS 0.1 07/22/2023 0225   EOSABS 0.1 07/22/2023 0225   BASOSABS 0.0 07/22/2023 0225   Comprehensive Metabolic Panel:    Component Value Date/Time   NA 130 (L) 08/03/2023 0615   K 4.5 08/03/2023 0615   CL 99 08/03/2023 0615   CO2 25 08/03/2023 0615   BUN 10 08/03/2023 0615   CREATININE 0.64 08/03/2023 0615   CREATININE 0.65 07/18/2023 0829   GLUCOSE 99 08/03/2023 0615   CALCIUM 7.3 (L) 08/03/2023 0615   AST 61 (H) 07/31/2023 0341   AST 106 (H) 07/18/2023 0829   ALT 99 (H) 07/31/2023 0341   ALT 113 (H) 07/18/2023 0829   ALKPHOS 173 (H) 07/31/2023 0341   BILITOT 0.6 07/31/2023 0341   BILITOT 0.5 07/18/2023 0829   PROT 4.7 (L) 07/31/2023 0341   ALBUMIN 2.4 (L) 07/31/2023 0341    RADIOGRAPHIC STUDIES: MR THORACIC SPINE W WO CONTRAST  Result Date: 07/30/2023 CLINICAL DATA:  Metastatic carcinoma of gastrointestinal primary. EXAM: MRI THORACIC WITHOUT AND WITH  CONTRAST TECHNIQUE: Multiplanar and multiecho pulse sequences of the thoracic spine were obtained without and with intravenous contrast. CONTRAST:  7mL GADAVIST GADOBUTROL 1 MMOL/ML IV SOLN COMPARISON:  None Available. FINDINGS: Alignment:  Normal Vertebrae: Widespread metastatic disease throughout the marrow spaces of every vertebral body level with involvement of the vertebral bodies and posterior elements. No pathologic fracture is seen. No evidence of epidural tumor. No evidence of metastatic disease to the cord. No foraminal disease identified in the thoracic region. Cord:  No evidence of metastatic disease to the cord is self. Paraspinal and other soft tissues: Posterior rib involvement is present as well. Disc levels: Disc degeneration and bulging at T7-8. Narrowing of the ventral subarachnoid space but no compression of the cord. IMPRESSION: 1. Widespread metastatic disease throughout the marrow spaces of every vertebral body level with involvement of the vertebral bodies and posterior elements. No pathologic fracture. No evidence of epidural tumor. No evidence of metastatic disease to the cord or neural foramina. 2. Disc degeneration and bulging at T7-8 with narrowing of the ventral subarachnoid space but no compression of the cord. Electronically Signed   By: Paulina Fusi M.D.   On: 07/30/2023 21:16   MR Lumbar Spine W Wo Contrast  Result Date: 07/30/2023 CLINICAL DATA:  Metastatic adenocarcinoma of gastrointestinal source. EXAM: MRI LUMBAR SPINE WITHOUT AND WITH CONTRAST TECHNIQUE: Multiplanar and multiecho pulse sequences of the lumbar spine were obtained without and with intravenous contrast. CONTRAST:  7mL GADAVIST GADOBUTROL 1 MMOL/ML IV SOLN COMPARISON:  04/17/2022 FINDINGS: Segmentation:  5 lumbar type vertebral bodies. Alignment:  Normal Vertebrae: Metastatic disease throughout the marrow spaces with involvement of every vertebral level and  the sacrum. Iliac bones are also diffusely involved.  The spinal involvement includes vertebral body involvement and posterior element involvement. No evidence of a pathologic fracture. Some extraosseous extension of tumor along the left side of the L2 vertebral body. Early tumor involvement of the intervertebral foramina on the left at L1-2 and L2-3. Conus medullaris and cauda equina: Conus extends to the L1 level. No evidence of metastatic disease to the distal cord, conus tip or nerve roots. Paraspinal and other soft tissues: Otherwise negative Disc levels: No significant disc level pathology. IMPRESSION: 1. Widespread osseous metastatic disease throughout the spine, sacrum and iliac bones. No evidence of pathologic fracture. Some extraosseous extension of tumor along the left side of the L2 vertebral body. Early tumor involvement of the intervertebral foramina on the left at L1-2 and L2-3. 2. No evidence of metastatic disease to the distal cord, conus tip or nerve roots. Electronically Signed   By: Paulina Fusi M.D.   On: 07/30/2023 21:13   MR CERVICAL SPINE W WO CONTRAST  Result Date: 07/30/2023 CLINICAL DATA:  Metastatic disease evaluation. 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 EXAM: MRI CERVICAL SPINE WITHOUT AND WITH CONTRAST TECHNIQUE: Multiplanar and multiecho pulse sequences of the cervical spine, to include the craniocervical junction and cervicothoracic junction, were obtained without and with intravenous contrast. CONTRAST:  7mL GADAVIST GADOBUTROL 1 MMOL/ML IV SOLN COMPARISON:  04/04/2022 FINDINGS: Alignment: Straightening of the normal cervical lordosis. Vertebrae and spinal cord: Metastatic disease throughout the region affecting all of the vertebral bodies, the clivus and the posterior elements. No evidence of extraosseous tumor causing neural encroachment. No metastatic disease to the spinal cord itself. Posterior Fossa, vertebral arteries, paraspinal tissues: Enlarged cervical lymph nodes on the left. Disc levels: Mild bulging of  the disc. Involution of a previously seen herniation at C4-5. No compressive stenosis of the canal or foramina due to disc disease. IMPRESSION: 1. Widespread osseous metastatic disease throughout the cervical spine, the clivus and the posterior elements. Involvement of every vertebral level. No evidence of extraosseous tumor causing neural encroachment. No evidence of metastatic disease to the spinal cord itself. 2. Enlarged cervical lymph nodes on the left consistent with metastatic disease. 3. Involution of a previously seen disc herniation at C4-5. No compressive stenosis of the canal or foramina due to degenerative disease. Electronically Signed   By: Paulina Fusi M.D.   On: 07/30/2023 21:09   US PELVIC COMPLETE W TRANSVAGINAL AND TORSION R/O  Result Date: 07/22/2023 CLINICAL DATA:  [id number] with pelvic pain for 5 days. History of metastatic adrenal carcinoma and steadily enlarging left adnexal/pelvic mass on CT with a normal left ovary not able to be distinguished from it. There was also evidence of distal descending and proximal to mid sigmoid colitis/diverticulitis on CT today. EXAM: TRANSABDOMINAL AND TRANSVAGINAL ULTRASOUND OF PELVIS DOPPLER ULTRASOUND OF OVARIES TECHNIQUE: Both transabdominal and transvaginal ultrasound examinations of the pelvis were performed. Transabdominal technique was performed for global imaging of the pelvis including uterus, ovaries, adnexal regions, and pelvic cul-de-sac. It was necessary to proceed with endovaginal exam following the transabdominal exam to visualize the uterus, right ovary and endometrium better. Color and duplex Doppler ultrasound was utilized to evaluate blood flow to the ovaries. COMPARISON:  CT with IV contrast today, PET-CT 07/06/2023, and earlier CT studies from this year back to 12/18/2022. FINDINGS: Uterus Measurements: Anteverted and heterogeneous measuring 7.2 x 3.8 x 4.3 cm = volume: 61.2 mL. No fibroids or other mass visualized. Incidentally noted  is a  2 cm simple nabothian cysts in the cervix. Despite myometrial heterogeneity there is no appreciable focal wall mass. Endometrium Thickness: 5.7 mm.  No focal abnormality visualized. Right ovary Measurements: 3.0 x 1.4 x 2.0 = volume: 4.3 mL. Normal appearance/no adnexal mass. Left ovary There is a heterogeneous hypervascular left adnexal mass which is inseparable and indistinguishable from the left ovary. On CT this measured 5.4 x 4.8 cm. Ultrasound is 5.5 x 4.8 x 5.2 cm and 72 mL. 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 Pulsed Doppler evaluation of both ovaries demonstrates normal low-resistance arterial and venous waveforms. Other findings Mild anechoic free fluid, which was also seen on CT. IMPRESSION: Heterogeneous hypervascular left adnexal mass which is inseparable from the left ovary. 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 Right ovary, uterus and endometrium are essentially unremarkable with incidental 2 cm simple nabothian cervical cyst. Mildly heterogeneous myometrium. Mild free fluid, nonspecific. Electronically Signed   By: Almira Bar M.D.   On: 07/22/2023 06:01   CT Angio Chest PE W and/or Wo Contrast  Result Date: 07/22/2023 CLINICAL DATA:  Weakness, emesis and diarrhea for the last several days. Currently taking chemotherapy and radiation for metastatic adrenal carcinoma. EXAM: CT ANGIOGRAPHY CHEST CT ABDOMEN AND PELVIS WITH CONTRAST TECHNIQUE: Multidetector CT imaging of the chest was performed using the standard protocol during bolus administration of intravenous contrast. Multiplanar CT image reconstructions and  MIPs were obtained to evaluate the vascular anatomy. Multidetector CT imaging of the abdomen and pelvis was performed using the standard protocol during bolus administration of intravenous contrast. RADIATION DOSE REDUCTION: This exam was performed according to the departmental dose-optimization program which includes automated exposure control, adjustment of the mA and/or kV according to patient size and/or use of iterative reconstruction technique. CONTRAST:  OMNIPAQUE IOHEXOL 350 MG/ML SOLN COMPARISON:  Portable chest today, PET-CT 07/06/2023, chest, abdomen and pelvis outside CT images only dated 02/03/2023. FINDINGS: CTA CHEST FINDINGS Cardiovascular: Pulmonary arteries are normal in caliber and do not show embolic filling defects. There is minimal atherosclerosis in the aortic arch. There is no aneurysm, stenosis or dissection. The great vessels are clear.  The pulmonary veins are nondistended. There is mild cardiomegaly with a left chamber predominance. There is a right chest port with IJ approach catheter with tip in the right atrium. There is a small pericardial effusion new from 07/06/2023. No visible coronary calcific plaques. Mediastinum/Nodes: Lower paratracheal space node measuring 8 mm in short axis on 4:45 and adjacent right-sided precarinal lymph node also 8 mm short axis. Subcarinal lymph node up to 9 mm short axis. All of these with uptake on PET-CT but no bulky adenopathy. No hilar adenopathy. In the left axilla multiple enlarged lymph nodes again are noted, with multifocal PET activity. Largest of these is 1.4 x 3.3 cm on 4:17, 2 x 1.5 cm on 4:20 and 2 x 1.5 cm on 4:31. There has been no change since 07/06/2023. There is an enlarged right axillary node measuring 1.3 x 1.8 cm on 4:34. Right cardiophrenic angle lymph nodes largest of these is 9 mm in short axis on 4:95. Lungs/Pleura: Mildly elevated right hemidiaphragm. Unchanged 6 mm pleural-based posterior left upper  lobe nodule on 5:36.  Unchanged 5 mm pleural-based nodule anteriorly in the left upper lobe on 5:49. There are no other appreciable nodules. There is no consolidation, effusion or pneumothorax. 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 There are scattered thoracic spine endplate Schmorl's nodes but no acute compression fracture. Findings are more confluent and widespread than on 02/03/2023. Review of the MIP images confirms the above findings. CT ABDOMEN and PELVIS FINDINGS Hepatobiliary: No hepatic mass enhancement. Mild hepatic steatosis. Cholelithiasis without evidence of cholecystitis or biliary dilatation. Pancreas: No abnormality. Spleen: No abnormality. 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 Both kidneys enhance homogeneously. There is no urinary stone or obstruction. No bladder thickening. Stomach/Bowel: There is fluid in the colon in the ascending and transverse segments. There are increasing thickened folds in the distal descending and proximal/mid sigmoid colon and adjacent stranding consistent with colitis or diverticulitis. There is no wall pneumatosis, free air, or abscess. Vascular/Lymphatic: Small hypermetabolic lymph nodes were noted on the recent PET-CT and have not changed. Examples include an aortocaval lymph node 7 mm short axis on 11: 39, left external iliac chain node is 9 mm short axis on 11:77, with scattered subcentimeter hypermetabolic inguinal lymph nodes also recently seen. By strict size criteria, majority of the hypermetabolic nodes are not frankly enlarged. There are slightly prominent left periaortic chain nodes up to 1 cm in short axis. Large heterogeneous left adnexal/pelvic mass again measures 5.4 x 4.8 cm on 11:72. This is about double the size it was on an earlier study this year on 12/18/2022. Reproductive: The uterus is intact. No new adnexal abnormality. 2 cm transmural fibroid of the right side of the fundus.  Other: Small volume of pelvic ascites. No free hemorrhage, free air or localizing collections. 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 No visible pathologic regional fracture. Review of the MIP images confirms the above findings. IMPRESSION: 1. No evidence of arterial embolus or acute chest process. 2. Small pericardial effusion, new from 07/06/2023. 3. Cardiomegaly without evidence of CHF. 4. Unchanged 6 mm and 5 mm left upper lobe nodules. 5. Bilateral axillary adenopathy, left greater than right, with borderline to slightly prominent mediastinal, retroperitoneal and pelvic lymph nodes. Positive PET activity on recent PET-CT. 6. 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 7. Increasing thickened folds in the distal descending and proximal/mid sigmoid colon with adjacent stranding consistent with colitis or diverticulitis. No wall pneumatosis, free air, or abscess. 8. 5.4 x 4.8 cm heterogeneous left adnexal/pelvic mass almost certainly metastatic, about double the size it was on an earlier study this year on 12/18/2022. 9. Small volume of pelvic ascites.  No free air. 10. Cholelithiasis without evidence of cholecystitis. 11. Mild hepatic steatosis. Electronically Signed   By: Almira Bar M.D.   On: 07/22/2023 04:40   CT ABDOMEN PELVIS W CONTRAST  Result Date: 07/22/2023 CLINICAL DATA:  Weakness, emesis and diarrhea for the last several days. Currently taking chemotherapy and radiation for metastatic adrenal carcinoma. EXAM: CT ANGIOGRAPHY CHEST CT ABDOMEN AND PELVIS WITH CONTRAST TECHNIQUE: Multidetector CT imaging of the chest was performed using the standard protocol during bolus administration of intravenous contrast. Multiplanar CT image reconstructions and MIPs were obtained to evaluate the vascular anatomy. Multidetector CT imaging of the abdomen and pelvis was performed using the standard protocol during bolus  administration of intravenous contrast. RADIATION DOSE REDUCTION: This exam was performed according to the departmental dose-optimization program which includes automated  exposure control, adjustment of the mA and/or kV according to patient size and/or use of iterative reconstruction technique. CONTRAST:  OMNIPAQUE IOHEXOL 350 MG/ML SOLN COMPARISON:  Portable chest today, PET-CT 07/06/2023, chest, abdomen and pelvis outside CT images only dated 02/03/2023. FINDINGS: CTA CHEST FINDINGS Cardiovascular: Pulmonary arteries are normal in caliber and do not show embolic filling defects. There is minimal atherosclerosis in the aortic arch. There is no aneurysm, stenosis or dissection. The great vessels are clear.  The pulmonary veins are nondistended. There is mild cardiomegaly with a left chamber predominance. There is a right chest port with IJ approach catheter with tip in the right atrium. There is a small pericardial effusion new from 07/06/2023. No visible coronary calcific plaques. Mediastinum/Nodes: Lower paratracheal space node measuring 8 mm in short axis on 4:45 and adjacent right-sided precarinal lymph node also 8 mm short axis. Subcarinal lymph node up to 9 mm short axis. All of these with uptake on PET-CT but no bulky adenopathy. No hilar adenopathy. In the left axilla multiple enlarged lymph nodes again are noted, with multifocal PET activity. Largest of these is 1.4 x 3.3 cm on 4:17, 2 x 1.5 cm on 4:20 and 2 x 1.5 cm on 4:31. There has been no change since 07/06/2023. There is an enlarged right axillary node measuring 1.3 x 1.8 cm on 4:34. Right cardiophrenic angle lymph nodes largest of these is 9 mm in short axis on 4:95. Lungs/Pleura: Mildly elevated right hemidiaphragm. Unchanged 6 mm pleural-based posterior left upper lobe nodule on 5:36. Unchanged 5 mm pleural-based nodule anteriorly in the left upper lobe on 5:49. There are no other appreciable nodules. There is no consolidation, effusion or  pneumothorax. 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 There are scattered thoracic spine endplate Schmorl's nodes but no acute compression fracture. Findings are more confluent and widespread than on 02/03/2023. Review of the MIP images confirms the above findings. CT ABDOMEN and PELVIS FINDINGS Hepatobiliary: No hepatic mass enhancement. Mild hepatic steatosis. Cholelithiasis without evidence of cholecystitis or biliary dilatation. Pancreas: No abnormality. Spleen: No abnormality. 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 Both kidneys enhance homogeneously. There is no urinary stone or obstruction. No bladder thickening. Stomach/Bowel: There is fluid in the colon in the ascending and transverse segments. There are increasing thickened folds in the distal descending and proximal/mid sigmoid colon and adjacent stranding consistent with colitis or diverticulitis. There is no wall pneumatosis, free air, or abscess. Vascular/Lymphatic: Small hypermetabolic lymph nodes were noted on the recent PET-CT and have not changed. Examples include an aortocaval lymph node 7 mm short axis on 11: 39, left external iliac chain node is 9 mm short axis on 11:77, with scattered subcentimeter hypermetabolic inguinal lymph nodes also recently seen. By strict size criteria, majority of the hypermetabolic nodes are not frankly enlarged. There are slightly prominent left periaortic chain nodes up to 1 cm in short axis. Large heterogeneous left adnexal/pelvic mass again measures 5.4 x 4.8 cm on 11:72. This is about double the size it was on an earlier study this year on 12/18/2022. Reproductive: The uterus is intact. No new adnexal abnormality. 2 cm transmural fibroid of the right side of the fundus. Other: Small volume of pelvic ascites. No free hemorrhage, free air or localizing collections. Musculoskeletal: Diffuse osteoblastic metastatic disease, also  has progressed from 02/03/2023. No visible pathologic regional fracture. Review of the MIP images confirms the above findings. IMPRESSION: 1. No evidence of arterial  embolus or acute chest process. 2. Small pericardial effusion, new from 07/06/2023. 3. Cardiomegaly without evidence of CHF. 4. Unchanged 6 mm and 5 mm left upper lobe nodules. 5. Bilateral axillary adenopathy, left greater than right, with borderline to slightly prominent mediastinal, retroperitoneal and pelvic lymph nodes. Positive PET activity on recent PET-CT. 6. 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 7. Increasing thickened folds in the distal descending and proximal/mid sigmoid colon with adjacent stranding consistent with colitis or diverticulitis. No wall pneumatosis, free air, or abscess. 8. 5.4 x 4.8 cm heterogeneous left adnexal/pelvic mass almost certainly metastatic, about double the size it was on an earlier study this year on 12/18/2022. 9. Small volume of pelvic ascites.  No free air. 10. Cholelithiasis without evidence of cholecystitis. 11. Mild hepatic steatosis. Electronically Signed   By: Almira Bar M.D.   On: 07/22/2023 04:40   DG Chest Port 1 View  Result Date: 07/22/2023 CLINICAL DATA:  Generalized weakness for several days, history of adrenal carcinoma EXAM: PORTABLE CHEST 1 VIEW COMPARISON:  PET-CT from 07/06/2019 FINDINGS: Cardiac shadow is within normal limits. Lungs are well aerated bilaterally. Right chest wall port is noted. Bony structures demonstrate increased sclerosis consistent with metastatic disease. IMPRESSION: Changes consistent with bony metastatic disease. These are stable from prior PET-CT. No acute abnormality noted. Electronically Signed   By: Alcide Clever M.D.   On: 07/22/2023 02:50   NM PET Image Restag (PS) Skull Base To Thigh  Result Date: 07/15/2023 CLINICAL DATA:  Subsequent treatment strategy for adrenal carcinoma. EXAM: NUCLEAR MEDICINE PET SKULL BASE TO THIGH  TECHNIQUE: 9.6 mCi F-18 FDG was injected intravenously. Full-ring PET imaging was performed from the skull base to thigh after the radiotracer. CT data was obtained and used for attenuation correction and anatomic localization. Fasting blood glucose: 93 mg/dl COMPARISON:  08/65/7846. FINDINGS: Mediastinal blood pool activity: SUV max 2.3 Liver activity: SUV max NA NECK: Worsening cervical hypermetabolic adenopathy. Index posterior left level II/III lymph node measures 8 mm (7/25), SUV max 6.1, previously 4 mm and 2.2. Incidental CT findings: None. CHEST: Hypermetabolic mediastinal and axillary lymph nodes, progressive. Index left axillary lymph node measures 13 mm (7/47), SUV max 7.9, compared to 10 mm and 6.8. Incidental CT findings: Right IJ Port-A-Cath terminates in the right atrium. Heart is enlarged. No pericardial or pleural effusion. ABDOMEN/PELVIS: Similar hypermetabolic abdominal retroperitoneal lymph nodes. Index aortocaval lymph node measures 8 mm (7/96), SUV max 4.5. Progressive pelvic retroperitoneal lymph nodes. Index left external iliac lymph node measures 12 mm (7/134), SUV max 8.2, previously 10 mm and 2.4. Additional small hypermetabolic inguinal lymph nodes. Enlarging left adnexal mass now measures 4.5 x 5.5 cm (7/128), SUV max similar at 3.3, previously 4.4 x 4.7 cm. Incidental CT findings: Liver is grossly unremarkable. Gallstones. Adrenal glands, kidneys, spleen, pancreas, stomach and bowel are grossly unremarkable. SKELETON: Mottled sclerosis and hypermetabolism throughout the visualized osseous structures, similar to the prior exam. Index hypermetabolism in the proximal right femur, SUV max 8.3, compared to 9.3. Incidental CT findings: None. IMPRESSION: 1. Slight interval progression in metastatic disease as evidenced by enlarging and/or increasing hypermetabolic lymph nodes in the neck, chest and pelvis. Abdominal retroperitoneal lymph nodes and osseous metastatic disease appear similar. 2.  Enlarging and mildly hypermetabolic left adnexal mass is worrisome for a metastasis. Previous ultrasound 02/24/2023. 3. Cholelithiasis. Electronically Signed   By: Leanna Battles M.D.   On: 07/15/2023 13:06    PERFORMANCE STATUS (ECOG) : 3 - Symptomatic, >50% confined to bed  Review of Systems Unless otherwise noted, a complete review of systems is negative.  Physical Exam General: NAD Cardiovascular: regular rate and rhythm Pulmonary: clear ant fields Abdomen: soft, nontender, + bowel sounds GU: no suprapubic tenderness Extremities: no edema, no joint deformities Skin: no rashes Neurological: Weakness but otherwise nonfocal  IMPRESSION: Follow-up visit.    Patient stable. Pain is improved. She has not required IV hydrmoorphone since last evening. Pain has been managed well on Percocet per patient. Discussed with patient/boyfriend and they are in agreement with resuming home pain regimen (Percocet and Tramadol ER). Boyfriend will bring in the Tramadol ER to give to pharmacy and be dispensed today. Recommend discharging patient home on this regimen and we can follow up outpatient regarding future pain needs.   Patient would like to continue steroids. Discussed slow taper over 1-2 weeks.   PLAN: -Continue current scope of treatment -Resume home pain regimen of tramadol ER and Percocet -Will follow  Case and plan discussed with Dr. Orlie Dakin   Time Total: 20 minutes  Visit consisted of counseling and education dealing with the complex and emotionally intense issues of symptom management and palliative care in the setting of serious and potentially life-threatening illness.Greater than 50%  of this time was spent counseling and coordinating care related to the above assessment and plan.  Signed by: Laurette Schimke, PhD, NP-C

## 2023-08-03 NOTE — Progress Notes (Signed)
ID Pt doing better No diarrhea Had formed stool Pain is being controlled a little better  O/e awake and alert BP 130/88   Pulse (!) 110   Temp 98.2 F (36.8 C) (Oral)   Resp 16   Ht 5\' 6"  (1.676 m)   Wt 77.1 kg   LMP  (LMP Unknown)   SpO2 100%   BMI 27.44 kg/m   Chest b/l air entry Hs1s2 Abd soft Minimal tenderness lower abdomen  Labs    Latest Ref Rng & Units 08/02/2023    5:50 AM 08/01/2023    3:29 AM 07/31/2023    3:41 AM  CBC  WBC 4.0 - 10.5 K/uL 4.6  3.5  4.6   Hemoglobin 12.0 - 15.0 g/dL 8.0  8.9  8.5   Hematocrit 36.0 - 46.0 % 24.2  27.1  25.1   Platelets 150 - 400 K/uL 32  36  31        Latest Ref Rng & Units 08/02/2023    5:50 AM 08/01/2023    3:29 AM 07/31/2023    3:41 AM  CMP  Glucose 70 - 99 mg/dL 295  621  308   BUN 6 - 20 mg/dL 16  18  16    Creatinine 0.44 - 1.00 mg/dL 6.57  8.46  9.62   Sodium 135 - 145 mmol/L 126  132  133   Potassium 3.5 - 5.1 mmol/L 4.5  4.0  3.3   Chloride 98 - 111 mmol/L 97  101  104   CO2 22 - 32 mmol/L 24  23  24    Calcium 8.9 - 10.3 mg/dL 7.6  7.8  7.8   Total Protein 6.5 - 8.1 g/dL   4.7   Total Bilirubin 0.3 - 1.2 mg/dL   0.6   Alkaline Phos 38 - 126 U/L   173   AST 15 - 41 U/L   61   ALT 0 - 44 U/L   99      Impression /recommendation 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 She has responded to oral steroids very well raising the 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

## 2023-08-03 NOTE — Progress Notes (Signed)
PHARMACY CONSULT NOTE  Pharmacy Consult for Electrolyte Monitoring and Replacement   Recent Labs: Potassium (mmol/L)  Date Value  08/03/2023 4.5   Magnesium (mg/dL)  Date Value  16/08/9603 1.9   Calcium (mg/dL)  Date Value  54/07/8118 7.3 (L)   Albumin (g/dL)  Date Value  14/78/2956 2.4 (L)   Phosphorus (mg/dL)  Date Value  21/30/8657 2.9   Sodium (mmol/L)  Date Value  08/03/2023 130 (L)   Assessment: 47 y.o. 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 Pharmacy is asked to follow and replace electrolytes  Goal of Therapy:  Electrolytes WNL  Plan:  --no electrolyte replacement warranted for today --electrolytes have been stable, no labs ordered for 10/03 given plans for d/c in am  Lowella Bandy 08/03/2023 7:17 AM

## 2023-08-03 NOTE — Progress Notes (Signed)
[facility]  - [place] [facility]   PATIENT NAME: Sheryl Silva    MR#:  [id number]  DATE OF BIRTH:  1976-04-09  SUBJECTIVE:  no family at bedside when seen patient earlier. She is tolerating PO diet quite well. Wants to consider tramadol ER and Percocet like before. Did not like the feel with fentanyl patch. Patient's partner is going to bring her home dose of tramadol. She is requesting to stay one more day. No vomiting. VITALS:  Blood pressure 121/78, pulse (!) 108, temperature 98 F (36.7 C), resp. rate 18, height 5\' 6"  (1.676 m), weight 77.1 kg, SpO2 99%.  PHYSICAL EXAMINATION:   GENERAL:  47 y.o.-year-old patient with no acute distress.  LUNGS: Normal breath sounds bilaterally CARDIOVASCULAR: S1, S2 normal. No murmur   ABDOMEN: Soft, benign. EXTREMITIES: No  edema b/l.    NEUROLOGIC: nonfocal  patient is alert and awake  LABORATORY PANEL:  CBC Recent Labs  Lab 08/02/23 0550  WBC 4.6  HGB 8.0*  HCT 24.2*  PLT 32*    Chemistries  Recent Labs  Lab 07/31/23 0341 08/01/23 0329 08/02/23 0550 08/03/23 0615  NA 133*   < > 126* 130*  K 3.3*   < > 4.5 4.5  CL 104   < > 97* 99  CO2 24   < > 24 25  GLUCOSE 125*   < > 100* 99  BUN 16   < > 16 10  CREATININE 0.63   < > 0.43* 0.64  CALCIUM 7.8*   < > 7.6* 7.3*  MG  --   --  1.9  --   AST 61*  --   --   --   ALT 99*  --   --   --   ALKPHOS 173*  --   --   --   BILITOT 0.6  --   --   --    < > = values in this interval not displayed.    RADIOLOGY:  No results found.  Assessment and Plan  Sheryl Silva is a 47 y.o. 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  Patient was diagnosed with stage IV metastatic cancer initially was with on no arranging, pathology from axillary lymph node showed possible GI source.  Patient  was started on chemo and radiation therapy with [id number] q. 14 days and so far she completed 7 cycles.  She said that with each cycle of chemotherapy she has had mild diarrhea but was never severe and or self-limiting.  Last chemotherapy and radiation was 9/16-17 Monday, patient started develop severe cramping pain in the left lower quadrant for 2 days then started to have a severe diarrhea,   CTA negative for PE, extensive evidence of metastatic cancer involving the bones, lymph nodes.  Acute colitis versus diverticulitis descending and sigmoid colon.  Left adnexal pelvic mass.  Pelvic ultrasound showed heterogeneous hypervascular left adnexal mass probably metastatic in nature.   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  Dose increased to 1 to 2 mg every 3 hours for better pain control.  I have discussed with her pros and cons of stronger pain  medications including possible respiratory depression -- PRN Imodium for diarrhea.  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  Now transitioning to oral valganciclovir for 4 weeks.  Pharmacy has gone over side effects.  She may need ongoing labs monitoring per ID --sepsis improved  Severe back pain/spasm --MRI of the cervical and thoracolumbar spine shows metastasis all over the spine but not in the spinal cord.  This is not new according to patient and per oncology  --Palliative care, Josh and Dr. Orlie Dakin are finalizing her ongoing pain medication regimen before  discharge  Severe hypokalemia -- Normalized  Hyponatremia -- likely from diarrhea and poor PO intake.  Sodium 132 -- sodium improving.  Diarrhea improved.    Cardiomegaly and small pericardial effusion -Stable status post 1 unit of PRBC transfusion on 9/27  Stage IV metastatic signet ring adenocarcinoma -- patient follows with Dr. Orlie Dakin -- patient getting chemotherapy. She finished radiation therapy  Chronic back pain PRN PO and IV pain meds --Palliative care Josh managing pain meds.  She is on Dilaudid, oxycodone and tramadol. -Await final pain medication regimen from palliative care/oncology for discharge  H/o Buerger's dz Tobacco use --stable  Pancytopenia  --Leukopenia is improved -likely response to fulphilia given on 9/18.  --Status post 2 platelet transfusion thus far.  9/26, 9/27.  Thrombocytopenia somewhat improved after transfusion.   --Status post 1 PRBC transfusion on 9/27 for hemoglobin of 7     Family communication : None at bedside Consults :GI, Oncology, ID, palliative care CODE STATUS: full code DVT Prophylaxis : SCDs Level of care: Telemetry Medical Status is: Inpatient Remains inpatient appropriate because: Requiring IV pain management, will need good pain medication plan before discharge home.    EDD likely am      TOTAL TIME TAKING CARE OF THIS PATIENT: 35 minutes.  >50% time spent on counselling and coordination of care  Note: This dictation was prepared with Dragon dictation along with smaller phrase technology. Any transcriptional errors that result from this process are unintentional.  Enedina Finner M.D    [facility]   CC: Primary care physician; [name], MD

## 2023-08-04 ENCOUNTER — Other Ambulatory Visit: Payer: Self-pay

## 2023-08-04 DIAGNOSIS — B259 Cytomegaloviral disease, unspecified: Secondary | ICD-10-CM | POA: Diagnosis not present

## 2023-08-04 DIAGNOSIS — A0839 Other viral enteritis: Secondary | ICD-10-CM | POA: Diagnosis not present

## 2023-08-04 MED ORDER — CYCLOBENZAPRINE HCL 7.5 MG PO TABS: 7.5000 mg | ORAL_TABLET | Freq: Three times a day (TID) | ORAL | 0 refills | Status: DC | PRN

## 2023-08-04 MED ORDER — ENSURE ENLIVE PO LIQD: 237.0000 mL | Freq: Three times a day (TID) | ORAL | 12 refills | Status: DC

## 2023-08-04 MED ORDER — POLYETHYLENE GLYCOL 3350 17 G PO PACK: 17.0000 g | PACK | Freq: Every day | ORAL | 0 refills | Status: DC | PRN

## 2023-08-04 MED ORDER — VALGANCICLOVIR HCL 450 MG PO TABS: 900.0000 mg | ORAL_TABLET | Freq: Two times a day (BID) | ORAL | 0 refills | Status: AC

## 2023-08-04 NOTE — Progress Notes (Signed)
Sheryl Silva to be D/C'd Home per MD order.  Discussed prescriptions and follow up appointments with the patient. Prescriptions given to patient, medication list explained in detail. Pt verbalized understanding.  Allergies as of 08/04/2023       Reactions   Nifedipine Palpitations        Medication List     STOP taking these medications    amLODipine 10 MG tablet Commonly known as: NORVASC   cloNIDine 0.1 MG tablet Commonly known as: Catapres   Senexon-S 8.6-50 MG tablet Generic drug: senna-docusate   tiZANidine 4 MG tablet Commonly known as: ZANAFLEX   topiramate 25 MG tablet Commonly known as: TOPAMAX       TAKE these medications    albuterol (2.5 MG/3ML) 0.083% nebulizer solution Commonly known as: PROVENTIL Take 2.5 mg by nebulization every 6 (six) hours as needed for wheezing or shortness of breath.   albuterol 108 (90 Base) MCG/ACT inhaler Commonly known as: VENTOLIN HFA Inhale 2 puffs into the lungs every 6 (six) hours as needed.   busPIRone 5 MG tablet Commonly known as: BUSPAR Take 5 mg by mouth 2 (two) times daily.   cyanocobalamin 500 MCG tablet Commonly known as: VITAMIN B12 Take 500 mcg by mouth daily.   cyclobenzaprine 7.5 MG tablet Commonly known as: FEXMID Take 1 tablet (7.5 mg total) by mouth 3 (three) times daily as needed for muscle spasms.   Doxepin HCl 3 MG Tabs Take 1 tablet by mouth at bedtime.   DULoxetine 60 MG capsule Commonly known as: CYMBALTA Take 60 mg by mouth daily.   feeding supplement Liqd Take 237 mLs by mouth 3 (three) times daily between meals.   gabapentin 300 MG capsule Commonly known as: NEURONTIN Take 300 mg by mouth 3 (three) times daily.   hydrocortisone 2.5 % cream Apply 1 Application topically 2 (two) times daily.   lidocaine-prilocaine cream Commonly known as: EMLA Apply to affected area once   megestrol 40 MG tablet Commonly known as: MEGACE TAKE 1 TABLET BY MOUTH EVERY DAY   multivitamin  tablet Take 1 tablet by mouth daily.   naloxone 4 MG/0.1ML Liqd nasal spray kit Commonly known as: NARCAN Place 1 spray into the nose once.   omeprazole 20 MG capsule Commonly known as: PRILOSEC Take 20 mg by mouth daily.   ondansetron 8 MG tablet Commonly known as: Zofran Take 1 tablet (8 mg total) by mouth every 8 (eight) hours as needed for nausea or vomiting. Start on the third day after chemotherapy.   oxyCODONE-acetaminophen 10-325 MG tablet Commonly known as: PERCOCET Take 1 tablet by mouth every 4 (four) hours as needed.   polyethylene glycol 17 g packet Commonly known as: MIRALAX / GLYCOLAX Take 17 g by mouth daily as needed. What changed:  when to take this reasons to take this   prochlorperazine 10 MG tablet Commonly known as: COMPAZINE Take 1 tablet (10 mg total) by mouth every 6 (six) hours as needed for nausea or vomiting.   traMADol 300 MG 24 hr tablet Commonly known as: ULTRAM-ER Take 300 mg by mouth daily.   valGANciclovir 450 MG tablet Commonly known as: Valcyte Take 2 tablets (900 mg total) by mouth 2 (two) times daily for 28 days.   Vitamin D (Ergocalciferol) 1.25 MG (50000 UNIT) Caps capsule Commonly known as: DRISDOL Take 50,000 Units by mouth every 7 (seven) days.               Durable Medical Equipment  (From admission,  onward)           Start     Ordered   08/04/23 0956  For home use only DME Walker rolling  Once       Question Answer Comment  Walker: With 5 Inch Wheels   Patient needs a walker to treat with the following condition Weakness      08/04/23 0956            Vitals:   08/04/23 0342 08/04/23 0735  BP: 108/76 113/78  Pulse: (!) 106 (!) 110  Resp: 18 16  Temp: 98.9 F (37.2 C) 98.5 F (36.9 C)  SpO2:  100%    Skin clean, dry and intact without evidence of skin break down, no evidence of skin tears noted. IV catheter discontinued intact. Site without signs and symptoms of complications. Dressing and  pressure applied. Pt denies pain at this time. No complaints noted.  An After Visit Summary was printed and given to the patient. Patient escorted via WC, and D/C home via private auto.  Sheryl Silva Sheryl Silva

## 2023-08-04 NOTE — TOC Transition Note (Signed)
Transition of Care [facility]) - CM/SW Discharge Note   Patient Details  Name: Sheryl Silva MRN: [id number] Date of Birth: 03/16/1976  [facility] CM/SW Contact:  Chapman Fitch, RN Phone Number: 08/04/2023, 9:59 AM   Clinical Narrative:    Notified by RN that patient requests RW for discharge Referral made to Jon with [employer].  RW will be delivered to room prior to discharge    Final next level of care: Home/Self Care     Patient Goals and CMS Choice      Discharge Placement                         Discharge Plan and Services Additional resources added to the After Visit Summary for                  DME Arranged: Walker rolling DME Agency: [employer] Date DME Agency Contacted: 08/04/23   Representative spoke with at DME Agency: Cletis Athens            Social Determinants of Health (SDOH) Interventions SDOH Screenings   Food Insecurity: Food Insecurity Present (07/22/2023)  Housing: Low Risk  (07/22/2023)  Transportation Needs: Unmet Transportation Needs (07/22/2023)  Utilities: Not At Risk (07/22/2023)  Alcohol Screen: Low Risk  (02/15/2023)  Depression (PHQ2-9): Low Risk  (02/14/2023)  Financial Resource Strain: Low Risk  (05/09/2023)   Received from [facility], [facility]  Recent Concern: Financial Resource Strain - Medium Risk (02/15/2023)  Physical Activity: Inactive (02/15/2023)  Social Connections: Socially Isolated (02/15/2023)  Stress: Stress Concern Present (02/15/2023)  Tobacco Use: Medium Risk (07/26/2023)     Readmission Risk Interventions    08/04/2023    8:47 AM 07/28/2023    8:49 AM  Readmission Risk Prevention Plan  Transportation Screening  Complete  PCP or Specialist Appt within 3-5 Days Complete   HRI or Home Care Consult  --  Social Work Consult for Recovery Care Planning/Counseling  Complete  Medication Review Oceanographer)  Complete

## 2023-08-04 NOTE — Plan of Care (Signed)

## 2023-08-04 NOTE — Progress Notes (Signed)
Port deaccessed. Bandage in place.

## 2023-08-04 NOTE — Discharge Summary (Signed)
Physician Discharge Summary   Patient: Sheryl Silva MRN: [id number] DOB: [birth year]  Admit date:     07/22/2023  Discharge date: 08/04/23  Discharge Physician: Enedina Finner   PCP: [name], MD   Recommendations at discharge:    F/u ID dr Donato Schultz in 3 weeks F/u Dr Orlie Dakin on your next appt F/u Your pain clinic in [facility] F/u Dr Marcello Fennel in 1-2 weeks  Discharge Diagnoses: Principal Problem:   Colitis Active Problems:   Adenocarcinoma (HCC)   Left sided colitis without complications [facility])   Palliative care encounter   CMV colitis (HCC)  Sheryl Silva is a 47 y.o. 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  Patient was diagnosed with stage IV metastatic cancer initially was with on no arranging, pathology from axillary lymph node showed possible GI source.  Patient was started on chemo and radiation therapy with [id number] q. 14 days and so far she completed 7 cycles.  She said that with each cycle of chemotherapy she has had mild diarrhea but was never severe and or self-limiting.  Last chemotherapy and radiation was 9/16-17 Monday, patient started develop severe cramping pain in the left lower quadrant for 2 days then started to have a severe diarrhea,    CTA negative for PE, extensive evidence of metastatic cancer involving the bones, lymph nodes.  Acute colitis versus diverticulitis descending and sigmoid colon.  Left adnexal pelvic mass.  Pelvic ultrasound showed heterogeneous hypervascular left adnexal mass probably metastatic in nature.    Sepsis due to acute rectosigmoid colitis/CMV colitis --Rectosigmoid biopsy report concerning for ischemic colitis with CMV viral cytopathic effect  -- patient came in with abdominal pain was found to be tachycardic tachypnea with abnormal CT scan -- PRN  Imodium for diarrhea.  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  Now transitioning to oral valganciclovir for 4 weeks.  Pharmacy has gone over side effects.  She will need to f/u ID as out pt for further rx and management --sepsis improved   Severe back pain/spasm --MRI of the cervical and thoracolumbar spine shows metastasis all over the spine but not in the spinal cord.  This is not new according to patient and per oncology  --Palliative care, Josh input noted--cont percocet prn and Tramadol ER for pain. Pt follows with pain clinic in [place] --prn flexeril   Severe hypokalemia -- Normalized   Hyponatremia -- likely from diarrhea and poor PO intake.  Sodium 132 -- sodium improving.  Diarrhea improved.     Cardiomegaly and small pericardial effusion -Stable status post 1 unit of PRBC transfusion on 9/27   Stage IV metastatic signet ring adenocarcinoma -- patient follows with Dr. Orlie Dakin -- patient getting chemotherapy. She finished radiation therapy   H/o Buerger's dz Tobacco use --stable   Pancytopenia  --Leukopenia is improved -likely response to fulphilia given on 9/18.  --Status post 2 platelet transfusion thus far.  9/26, 9/27.  Thrombocytopenia somewhat improved after transfusion.   --Status post 1 PRBC transfusion on 9/27 for hemoglobin of 7   Pt reports she is not taking topamax.    Family communication : None at bedside Consults :GI, Oncology,  ID, palliative care CODE STATUS: full code DVT Prophylaxis : SCDs  Overall best at baseline. D/c home. Pt agreeable     Pain control - [place] Controlled Substance Reporting System database was reviewed. and patient was instructed, not to drive, operate heavy machinery,  perform activities at heights, swimming or participation in water activities or provide baby-sitting services while on Pain, Sleep and Anxiety Medications; until their outpatient Physician has advised to do so again. Also recommended to not to take more than prescribed Pain, Sleep and Anxiety Medications.  Disposition: Home Diet recommendation:  Discharge Diet Orders (From admission, onward)     Start     Ordered   08/04/23 0000  Diet general        08/04/23 0832            DISCHARGE MEDICATION: Allergies as of 08/04/2023       Reactions   Nifedipine Palpitations        Medication List     STOP taking these medications    amLODipine 10 MG tablet Commonly known as: NORVASC   cloNIDine 0.1 MG tablet Commonly known as: Catapres   Senexon-S 8.6-50 MG tablet Generic drug: senna-docusate   tiZANidine 4 MG tablet Commonly known as: ZANAFLEX   topiramate 25 MG tablet Commonly known as: TOPAMAX       TAKE these medications    albuterol (2.5 MG/3ML) 0.083% nebulizer solution Commonly known as: PROVENTIL Take 2.5 mg by nebulization every 6 (six) hours as needed for wheezing or shortness of breath.   albuterol 108 (90 Base) MCG/ACT inhaler Commonly known as: VENTOLIN HFA Inhale 2 puffs into the lungs every 6 (six) hours as needed.   busPIRone 5 MG tablet Commonly known as: BUSPAR Take 5 mg by mouth 2 (two) times daily.   cyanocobalamin 500 MCG tablet Commonly known as: VITAMIN B12 Take 500 mcg by mouth daily.   cyclobenzaprine 7.5 MG tablet Commonly known as: FEXMID Take 1 tablet (7.5 mg total) by mouth 3 (three) times daily as needed for muscle spasms.   Doxepin HCl 3 MG Tabs Take 1 tablet by mouth at bedtime.   DULoxetine 60 MG capsule Commonly known as: CYMBALTA Take 60 mg by mouth daily.   feeding supplement Liqd Take 237 mLs by mouth 3 (three) times daily between meals.   gabapentin 300 MG capsule Commonly known as: NEURONTIN Take 300 mg by  mouth 3 (three) times daily.   hydrocortisone 2.5 % cream Apply 1 Application topically 2 (two) times daily.   lidocaine-prilocaine cream Commonly known as: EMLA Apply to affected area once   megestrol 40 MG tablet Commonly known as: MEGACE TAKE 1 TABLET BY MOUTH EVERY DAY   multivitamin tablet Take 1 tablet by mouth daily.   naloxone 4 MG/0.1ML Liqd nasal spray kit Commonly known as: NARCAN Place 1 spray into the nose once.   omeprazole 20 MG capsule Commonly known as: PRILOSEC Take 20 mg by mouth daily.   ondansetron 8 MG tablet Commonly known as: Zofran Take 1 tablet (8 mg total) by mouth every 8 (eight) hours as needed for nausea or vomiting. Start on the third day after chemotherapy.   oxyCODONE-acetaminophen 10-325 MG tablet Commonly known as: PERCOCET Take 1 tablet by mouth every 4 (four) hours as needed.   polyethylene glycol 17 g packet Commonly known as: MIRALAX / GLYCOLAX Take 17 g by mouth daily as needed. What changed:  when to take this reasons to take this   prochlorperazine 10  MG tablet Commonly known as: COMPAZINE Take 1 tablet (10 mg total) by mouth every 6 (six) hours as needed for nausea or vomiting.   traMADol 300 MG 24 hr tablet Commonly known as: ULTRAM-ER Take 300 mg by mouth daily.   valGANciclovir 450 MG tablet Commonly known as: Valcyte Take 2 tablets (900 mg total) by mouth 2 (two) times daily for 28 days.   Vitamin D (Ergocalciferol) 1.25 MG (50000 UNIT) Caps capsule Commonly known as: DRISDOL Take 50,000 Units by mouth every 7 (seven) days.        Follow-up Information     [name], MD. Schedule an appointment as soon as possible for a visit in 1 week(s).   Specialty: Internal Medicine Contact information: [address] [place] [address] [phone]         Lynn Ito, MD. Schedule an appointment as soon as possible for a visit in 3 week(s).   Specialty:  Infectious Diseases Contact information: [address] [place] [address] [phone]         Jeralyn Ruths, MD. Go to.   Specialty: Oncology Why: onyour upcoming appt Contact information: [address] [place] [address] [phone]                Discharge Exam: Filed Weights   07/26/23 1547 08/04/23 0342  Weight: 77.1 kg 79.3 kg  GENERAL:  47 y.o.-year-old patient with no acute distress.  LUNGS: decreased  breath sounds bilaterally CARDIOVASCULAR: S1, S2 normal. No murmur   ABDOMEN: Soft, benign. EXTREMITIES: No  edema b/l.    NEUROLOGIC: nonfocal  patient is alert and awake     Condition at discharge: fair  The results of significant diagnostics from this hospitalization (including imaging, microbiology, ancillary and laboratory) are listed below for reference.   Imaging Studies: MR THORACIC SPINE W WO CONTRAST  Result Date: 07/30/2023 CLINICAL DATA:  Metastatic carcinoma of gastrointestinal primary. EXAM: MRI THORACIC WITHOUT AND WITH CONTRAST TECHNIQUE: Multiplanar and multiecho pulse sequences of the thoracic spine were obtained without and with intravenous contrast. CONTRAST:  7mL GADAVIST GADOBUTROL 1 MMOL/ML IV SOLN COMPARISON:  None Available. FINDINGS: Alignment:  Normal Vertebrae: Widespread metastatic disease throughout the marrow spaces of every vertebral body level with involvement of the vertebral bodies and posterior elements. No pathologic fracture is seen. No evidence of epidural tumor. No evidence of metastatic disease to the cord. No foraminal disease identified in the thoracic region. Cord:  No evidence of metastatic disease to the cord is self. Paraspinal and other soft tissues: Posterior rib involvement is present as well. Disc levels: Disc degeneration and bulging at T7-8. Narrowing of the ventral subarachnoid space but no compression of the cord. IMPRESSION: 1. Widespread metastatic disease throughout the marrow spaces  of every vertebral body level with involvement of the vertebral bodies and posterior elements. No pathologic fracture. No evidence of epidural tumor. No evidence of metastatic disease to the cord or neural foramina. 2. Disc degeneration and bulging at T7-8 with narrowing of the ventral subarachnoid space but no compression of the cord. Electronically Signed   By: Paulina Fusi M.D.   On: 07/30/2023 21:16   MR Lumbar Spine W Wo Contrast  Result Date: 07/30/2023 CLINICAL DATA:  Metastatic adenocarcinoma of gastrointestinal source. EXAM: MRI LUMBAR SPINE WITHOUT AND WITH CONTRAST TECHNIQUE: Multiplanar and multiecho pulse sequences of the lumbar spine were obtained without and with intravenous contrast. CONTRAST:  7mL GADAVIST GADOBUTROL 1 MMOL/ML IV SOLN COMPARISON:  04/17/2022 FINDINGS: Segmentation:  5 lumbar type vertebral bodies. Alignment:  Normal Vertebrae: Metastatic disease throughout the marrow spaces with involvement of every vertebral level and the sacrum. Iliac bones are also diffusely involved. The spinal involvement includes vertebral body involvement and posterior element involvement. No evidence of a pathologic fracture. Some extraosseous extension of tumor along the left side of the L2 vertebral body. Early tumor involvement of the intervertebral foramina on the left at L1-2 and L2-3. Conus medullaris and cauda equina: Conus extends to the L1 level. No evidence of metastatic disease to the distal cord, conus tip or nerve roots. Paraspinal and other soft tissues: Otherwise negative Disc levels: No significant disc level pathology. IMPRESSION: 1. Widespread osseous metastatic disease throughout the spine, sacrum and iliac bones. No evidence of pathologic fracture. Some extraosseous extension of tumor along the left side of the L2 vertebral body. Early tumor involvement of the intervertebral foramina on the left at L1-2 and L2-3. 2. No evidence of metastatic disease to the distal cord, conus tip or  nerve roots. Electronically Signed   By: Paulina Fusi M.D.   On: 07/30/2023 21:13   MR CERVICAL SPINE W WO CONTRAST  Result Date: 07/30/2023 CLINICAL DATA:  Metastatic disease evaluation. 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 EXAM: MRI CERVICAL SPINE WITHOUT AND WITH CONTRAST TECHNIQUE: Multiplanar and multiecho pulse sequences of the cervical spine, to include the craniocervical junction and cervicothoracic junction, were obtained without and with intravenous contrast. CONTRAST:  7mL GADAVIST GADOBUTROL 1 MMOL/ML IV SOLN COMPARISON:  04/04/2022 FINDINGS: Alignment: Straightening of the normal cervical lordosis. Vertebrae and spinal cord: Metastatic disease throughout the region affecting all of the vertebral bodies, the clivus and the posterior elements. No evidence of extraosseous tumor causing neural encroachment. No metastatic disease to the spinal cord itself. Posterior Fossa, vertebral arteries, paraspinal tissues: Enlarged cervical lymph nodes on the left. Disc levels: Mild bulging of the disc. Involution of a previously seen herniation at C4-5. No compressive stenosis of the canal or foramina due to disc disease. IMPRESSION: 1. Widespread osseous metastatic disease throughout the cervical spine, the clivus and the posterior elements. Involvement of every vertebral level. No evidence of extraosseous tumor causing neural encroachment. No evidence of metastatic disease to the spinal cord itself. 2. Enlarged cervical lymph nodes on the left consistent with metastatic disease. 3. Involution of a previously seen disc herniation at C4-5. No compressive stenosis of the canal or foramina due to degenerative disease. Electronically Signed   By: Paulina Fusi M.D.   On: 07/30/2023 21:09   US PELVIC COMPLETE W TRANSVAGINAL AND TORSION R/O  Result Date: 07/22/2023 CLINICAL DATA:  [id number] with pelvic pain for 5 days. History of metastatic adrenal carcinoma and steadily enlarging left adnexal/pelvic  mass on CT with a normal left ovary not able to be distinguished from it. There was also evidence of distal descending and proximal to mid sigmoid colitis/diverticulitis on CT today. EXAM: TRANSABDOMINAL AND TRANSVAGINAL ULTRASOUND OF PELVIS DOPPLER ULTRASOUND OF OVARIES TECHNIQUE: Both transabdominal and transvaginal ultrasound examinations of the pelvis were performed. Transabdominal technique was performed for global imaging of the pelvis including uterus, ovaries, adnexal regions, and pelvic cul-de-sac. It was necessary to proceed with endovaginal exam following the transabdominal exam to visualize the uterus, right ovary and endometrium better. Color and duplex Doppler ultrasound was utilized to evaluate blood flow to the ovaries. COMPARISON:  CT with IV contrast today, PET-CT 07/06/2023, and earlier CT studies from this year back to 12/18/2022. FINDINGS: Uterus  Measurements: Anteverted and heterogeneous measuring 7.2 x 3.8 x 4.3 cm = volume: 61.2 mL. No fibroids or other mass visualized. Incidentally noted is a 2 cm simple nabothian cysts in the cervix. Despite myometrial heterogeneity there is no appreciable focal wall mass. Endometrium Thickness: 5.7 mm.  No focal abnormality visualized. Right ovary Measurements: 3.0 x 1.4 x 2.0 = volume: 4.3 mL. Normal appearance/no adnexal mass. Left ovary There is a heterogeneous hypervascular left adnexal mass which is inseparable and indistinguishable from the left ovary. On CT this measured 5.4 x 4.8 cm. Ultrasound is 5.5 x 4.8 x 5.2 cm and 72 mL. 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 Pulsed Doppler evaluation of both ovaries demonstrates normal low-resistance arterial and venous waveforms. Other findings Mild anechoic free fluid, which was also seen on CT. IMPRESSION:  Heterogeneous hypervascular left adnexal mass which is inseparable from the left ovary. 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 Right ovary, uterus and endometrium are essentially unremarkable with incidental 2 cm simple nabothian cervical cyst. Mildly heterogeneous myometrium. Mild free fluid, nonspecific. Electronically Signed   By: Almira Bar M.D.   On: 07/22/2023 06:01   CT Angio Chest PE W and/or Wo Contrast  Result Date: 07/22/2023 CLINICAL DATA:  Weakness, emesis and diarrhea for the last several days. Currently taking chemotherapy and radiation for metastatic adrenal carcinoma. EXAM: CT ANGIOGRAPHY CHEST CT ABDOMEN AND PELVIS WITH CONTRAST TECHNIQUE: Multidetector CT imaging of the chest was performed using the standard protocol during bolus administration of intravenous contrast. Multiplanar CT image reconstructions and MIPs were obtained to evaluate the vascular anatomy. Multidetector CT imaging of the abdomen and pelvis was performed using the standard protocol during bolus administration of intravenous contrast. RADIATION DOSE REDUCTION: This exam was performed according to the departmental dose-optimization program which includes automated exposure control, adjustment of the mA and/or kV according to patient size and/or use of iterative reconstruction technique. CONTRAST:  OMNIPAQUE IOHEXOL 350 MG/ML SOLN COMPARISON:  Portable chest today, PET-CT 07/06/2023, chest, abdomen and pelvis outside CT images only dated 02/03/2023. FINDINGS: CTA CHEST FINDINGS Cardiovascular: Pulmonary arteries are normal in caliber and do not show embolic filling defects. There is minimal atherosclerosis in the aortic arch. There is no aneurysm, stenosis or dissection. The great vessels are clear.  The pulmonary veins are  nondistended. There is mild cardiomegaly with a left chamber predominance. There is a right chest port with IJ approach catheter with tip in the right atrium. There is a small pericardial effusion new from 07/06/2023. No visible coronary calcific plaques. Mediastinum/Nodes: Lower paratracheal space node measuring 8 mm in short axis on 4:45 and adjacent right-sided precarinal lymph node also 8 mm short axis. Subcarinal lymph node up to 9 mm short axis. All of these with uptake on PET-CT but no bulky adenopathy. No hilar adenopathy. In the left axilla multiple enlarged lymph nodes again are noted, with multifocal PET activity. Largest of these is 1.4 x 3.3 cm on 4:17, 2 x 1.5 cm on 4:20 and 2 x 1.5 cm on 4:31. There has been no change since 07/06/2023. There is an enlarged right axillary node measuring 1.3 x 1.8 cm on 4:34. Right cardiophrenic  angle lymph nodes largest of these is 9 mm in short axis on 4:95. Lungs/Pleura: Mildly elevated right hemidiaphragm. Unchanged 6 mm pleural-based posterior left upper lobe nodule on 5:36. Unchanged 5 mm pleural-based nodule anteriorly in the left upper lobe on 5:49. There are no other appreciable nodules. There is no consolidation, effusion or pneumothorax. 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 There are scattered thoracic spine endplate Schmorl's nodes but no acute compression fracture. Findings are more confluent and widespread than on 02/03/2023. Review of the MIP images confirms the above findings. CT ABDOMEN and PELVIS FINDINGS Hepatobiliary: No hepatic mass enhancement. Mild hepatic steatosis. Cholelithiasis without evidence of cholecystitis or biliary dilatation. Pancreas: No abnormality. Spleen: No abnormality. 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 Both kidneys enhance homogeneously. There is no urinary stone or obstruction. No bladder thickening. Stomach/Bowel: There is fluid  in the colon in the ascending and transverse segments. There are increasing thickened folds in the distal descending and proximal/mid sigmoid colon and adjacent stranding consistent with colitis or diverticulitis. There is no wall pneumatosis, free air, or abscess. Vascular/Lymphatic: Small hypermetabolic lymph nodes were noted on the recent PET-CT and have not changed. Examples include an aortocaval lymph node 7 mm short axis on 11: 39, left external iliac chain node is 9 mm short axis on 11:77, with scattered subcentimeter hypermetabolic inguinal lymph nodes also recently seen. By strict size criteria, majority of the hypermetabolic nodes are not frankly enlarged. There are slightly prominent left periaortic chain nodes up to 1 cm in short axis. Large heterogeneous left adnexal/pelvic mass again measures 5.4 x 4.8 cm on 11:72. This is about double the size it was on an earlier study this year on 12/18/2022. Reproductive: The uterus is intact. No new adnexal abnormality. 2 cm transmural fibroid of the right side of the fundus. Other: Small volume of pelvic ascites. No free hemorrhage, free air or localizing collections. 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 No visible pathologic regional fracture. Review of the MIP images confirms the above findings. IMPRESSION: 1. No evidence of arterial embolus or acute chest process. 2. Small pericardial effusion, new from 07/06/2023. 3. Cardiomegaly without evidence of CHF. 4. Unchanged 6 mm and 5 mm left upper lobe nodules. 5. Bilateral axillary adenopathy, left greater than right, with borderline to slightly prominent mediastinal, retroperitoneal and pelvic lymph nodes. Positive PET activity on recent PET-CT. 6. 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 7. Increasing thickened folds in the distal descending and proximal/mid sigmoid colon with adjacent stranding consistent with colitis or  diverticulitis. No wall pneumatosis, free air, or abscess. 8. 5.4 x 4.8 cm heterogeneous left adnexal/pelvic mass almost certainly metastatic, about double the size it was on an earlier study this year on 12/18/2022. 9. Small volume of pelvic ascites.  No free air. 10. Cholelithiasis without evidence of cholecystitis. 11. Mild hepatic steatosis. Electronically Signed   By: Almira Bar M.D.   On: 07/22/2023 04:40   CT ABDOMEN PELVIS W CONTRAST  Result Date: 07/22/2023 CLINICAL DATA:  Weakness, emesis and diarrhea for the last several days. Currently taking chemotherapy and radiation for metastatic adrenal carcinoma. EXAM: CT ANGIOGRAPHY CHEST CT ABDOMEN AND PELVIS WITH CONTRAST TECHNIQUE: Multidetector CT imaging of the chest was performed using the standard protocol during bolus administration of intravenous contrast. Multiplanar CT image reconstructions and MIPs were obtained to evaluate the vascular anatomy. Multidetector CT imaging of the abdomen and pelvis was performed using  the standard protocol during bolus administration of intravenous contrast. RADIATION DOSE REDUCTION: This exam was performed according to the departmental dose-optimization program which includes automated exposure control, adjustment of the mA and/or kV according to patient size and/or use of iterative reconstruction technique. CONTRAST:  OMNIPAQUE IOHEXOL 350 MG/ML SOLN COMPARISON:  Portable chest today, PET-CT 07/06/2023, chest, abdomen and pelvis outside CT images only dated 02/03/2023. FINDINGS: CTA CHEST FINDINGS Cardiovascular: Pulmonary arteries are normal in caliber and do not show embolic filling defects. There is minimal atherosclerosis in the aortic arch. There is no aneurysm, stenosis or dissection. The great vessels are clear.  The pulmonary veins are nondistended. There is mild cardiomegaly with a left chamber predominance. There is a right chest port with IJ approach catheter with tip in the right atrium. There  is a small pericardial effusion new from 07/06/2023. No visible coronary calcific plaques. Mediastinum/Nodes: Lower paratracheal space node measuring 8 mm in short axis on 4:45 and adjacent right-sided precarinal lymph node also 8 mm short axis. Subcarinal lymph node up to 9 mm short axis. All of these with uptake on PET-CT but no bulky adenopathy. No hilar adenopathy. In the left axilla multiple enlarged lymph nodes again are noted, with multifocal PET activity. Largest of these is 1.4 x 3.3 cm on 4:17, 2 x 1.5 cm on 4:20 and 2 x 1.5 cm on 4:31. There has been no change since 07/06/2023. There is an enlarged right axillary node measuring 1.3 x 1.8 cm on 4:34. Right cardiophrenic angle lymph nodes largest of these is 9 mm in short axis on 4:95. Lungs/Pleura: Mildly elevated right hemidiaphragm. Unchanged 6 mm pleural-based posterior left upper lobe nodule on 5:36. Unchanged 5 mm pleural-based nodule anteriorly in the left upper lobe on 5:49. There are no other appreciable nodules. There is no consolidation, effusion or pneumothorax. 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 There are scattered thoracic spine endplate Schmorl's nodes but no acute compression fracture. Findings are more confluent and widespread than on 02/03/2023. Review of the MIP images confirms the above findings. CT ABDOMEN and PELVIS FINDINGS Hepatobiliary: No hepatic mass enhancement. Mild hepatic steatosis. Cholelithiasis without evidence of cholecystitis or biliary dilatation. Pancreas: No abnormality. Spleen: No abnormality. 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 Both kidneys enhance homogeneously. There is no urinary stone or obstruction. No bladder thickening. Stomach/Bowel: There is fluid in the colon in the ascending and transverse segments. There are increasing thickened folds in the distal descending and proximal/mid sigmoid colon and adjacent  stranding consistent with colitis or diverticulitis. There is no wall pneumatosis, free air, or abscess. Vascular/Lymphatic: Small hypermetabolic lymph nodes were noted on the recent PET-CT and have not changed. Examples include an aortocaval lymph node 7 mm short axis on 11: 39, left external iliac chain node is 9 mm short axis on 11:77, with scattered subcentimeter hypermetabolic inguinal lymph nodes also recently seen. By strict size criteria, majority of the hypermetabolic nodes are not frankly enlarged. There are slightly prominent left periaortic chain nodes up to 1 cm in short axis. Large heterogeneous left adnexal/pelvic mass again measures 5.4 x 4.8 cm on 11:72. This is about double the size it was on an earlier study this year on 12/18/2022. Reproductive: The uterus is intact. No new adnexal abnormality. 2 cm transmural fibroid of the right side of the fundus. Other: Small volume of pelvic ascites. No free hemorrhage, free air or localizing collections. Musculoskeletal: Diffuse osteoblastic metastatic disease,  also has progressed from 02/03/2023. No visible pathologic regional fracture. Review of the MIP images confirms the above findings. IMPRESSION: 1. No evidence of arterial embolus or acute chest process. 2. Small pericardial effusion, new from 07/06/2023. 3. Cardiomegaly without evidence of CHF. 4. Unchanged 6 mm and 5 mm left upper lobe nodules. 5. Bilateral axillary adenopathy, left greater than right, with borderline to slightly prominent mediastinal, retroperitoneal and pelvic lymph nodes. Positive PET activity on recent PET-CT. 6. 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 7. Increasing thickened folds in the distal descending and proximal/mid sigmoid colon with adjacent stranding consistent with colitis or diverticulitis. No wall pneumatosis, free air, or abscess. 8. 5.4 x 4.8 cm heterogeneous left adnexal/pelvic mass almost certainly metastatic, about double the size  it was on an earlier study this year on 12/18/2022. 9. Small volume of pelvic ascites.  No free air. 10. Cholelithiasis without evidence of cholecystitis. 11. Mild hepatic steatosis. Electronically Signed   By: Almira Bar M.D.   On: 07/22/2023 04:40   DG Chest Port 1 View  Result Date: 07/22/2023 CLINICAL DATA:  Generalized weakness for several days, history of adrenal carcinoma EXAM: PORTABLE CHEST 1 VIEW COMPARISON:  PET-CT from 07/06/2019 FINDINGS: Cardiac shadow is within normal limits. Lungs are well aerated bilaterally. Right chest wall port is noted. Bony structures demonstrate increased sclerosis consistent with metastatic disease. IMPRESSION: Changes consistent with bony metastatic disease. These are stable from prior PET-CT. No acute abnormality noted. Electronically Signed   By: Alcide Clever M.D.   On: 07/22/2023 02:50   NM PET Image Restag (PS) Skull Base To Thigh  Result Date: 07/15/2023 CLINICAL DATA:  Subsequent treatment strategy for adrenal carcinoma. EXAM: NUCLEAR MEDICINE PET SKULL BASE TO THIGH TECHNIQUE: 9.6 mCi F-18 FDG was injected intravenously. Full-ring PET imaging was performed from the skull base to thigh after the radiotracer. CT data was obtained and used for attenuation correction and anatomic localization. Fasting blood glucose: 93 mg/dl COMPARISON:  16/08/9603. FINDINGS: Mediastinal blood pool activity: SUV max 2.3 Liver activity: SUV max NA NECK: Worsening cervical hypermetabolic adenopathy. Index posterior left level II/III lymph node measures 8 mm (7/25), SUV max 6.1, previously 4 mm and 2.2. Incidental CT findings: None. CHEST: Hypermetabolic mediastinal and axillary lymph nodes, progressive. Index left axillary lymph node measures 13 mm (7/47), SUV max 7.9, compared to 10 mm and 6.8. Incidental CT findings: Right IJ Port-A-Cath terminates in the right atrium. Heart is enlarged. No pericardial or pleural effusion. ABDOMEN/PELVIS: Similar hypermetabolic abdominal  retroperitoneal lymph nodes. Index aortocaval lymph node measures 8 mm (7/96), SUV max 4.5. Progressive pelvic retroperitoneal lymph nodes. Index left external iliac lymph node measures 12 mm (7/134), SUV max 8.2, previously 10 mm and 2.4. Additional small hypermetabolic inguinal lymph nodes. Enlarging left adnexal mass now measures 4.5 x 5.5 cm (7/128), SUV max similar at 3.3, previously 4.4 x 4.7 cm. Incidental CT findings: Liver is grossly unremarkable. Gallstones. Adrenal glands, kidneys, spleen, pancreas, stomach and bowel are grossly unremarkable. SKELETON: Mottled sclerosis and hypermetabolism throughout the visualized osseous structures, similar to the prior exam. Index hypermetabolism in the proximal right femur, SUV max 8.3, compared to 9.3. Incidental CT findings: None. IMPRESSION: 1. Slight interval progression in metastatic disease as evidenced by enlarging and/or increasing hypermetabolic lymph nodes in the neck, chest and pelvis. Abdominal retroperitoneal lymph nodes and osseous metastatic disease appear similar. 2. Enlarging and mildly hypermetabolic left adnexal mass is worrisome for a metastasis. Previous ultrasound 02/24/2023. 3. Cholelithiasis. Electronically Signed  By: Leanna Battles M.D.   On: 07/15/2023 13:06    Microbiology: Results for orders placed or performed during the hospital encounter of 07/22/23  Resp panel by RT-PCR (RSV, Flu A&B, Covid) Anterior Nasal Swab     Status: None   Collection Time: 07/22/23  2:25 AM   Specimen: Anterior Nasal Swab  Result Value Ref Range Status   SARS Coronavirus 2 by RT PCR NEGATIVE NEGATIVE Final    Comment: (NOTE) SARS-CoV-2 target nucleic acids are NOT DETECTED.  The SARS-CoV-2 RNA is generally detectable in upper respiratory specimens during the acute phase of infection. The lowest concentration of SARS-CoV-2 viral copies this assay can detect is 138 copies/mL. A negative result does not preclude SARS-Cov-2 infection and should not  be used as the sole basis for treatment or other patient management decisions. A negative result may occur with  improper specimen collection/handling, submission of specimen other than nasopharyngeal swab, presence of viral mutation(s) within the areas targeted by this assay, and inadequate number of viral copies(<138 copies/mL). A negative result must be combined with clinical observations, patient history, and epidemiological information. The expected result is Negative.  Fact Sheet for Patients:  BloggerCourse.com  Fact Sheet for Healthcare Providers:  SeriousBroker.it  This test is no t yet approved or cleared by the [place] FDA and  has been authorized for detection and/or diagnosis of SARS-CoV-2 by FDA under an Emergency Use Authorization (EUA). This EUA will remain  in effect (meaning this test can be used) for the duration of the COVID-19 declaration under Section 564(b)(1) of the Act, 21 U.S.C.section 360bbb-3(b)(1), unless the authorization is terminated  or revoked sooner.       Influenza A by PCR NEGATIVE NEGATIVE Final   Influenza B by PCR NEGATIVE NEGATIVE Final    Comment: (NOTE) The Xpert Xpress SARS-CoV-2/FLU/RSV plus assay is intended as an aid in the diagnosis of influenza from Nasopharyngeal swab specimens and should not be used as a sole basis for treatment. Nasal washings and aspirates are unacceptable for Xpert Xpress SARS-CoV-2/FLU/RSV testing.  Fact Sheet for Patients: BloggerCourse.com  Fact Sheet for Healthcare Providers: SeriousBroker.it  This test is not yet approved or cleared by the [place] FDA and has been authorized for detection and/or diagnosis of SARS-CoV-2 by FDA under an Emergency Use Authorization (EUA). This EUA will remain in effect (meaning this test can be used) for the duration of the COVID-19 declaration under Section  564(b)(1) of the Act, 21 U.S.C. section 360bbb-3(b)(1), unless the authorization is terminated or revoked.     Resp Syncytial Virus by PCR NEGATIVE NEGATIVE Final    Comment: (NOTE) Fact Sheet for Patients: BloggerCourse.com  Fact Sheet for Healthcare Providers: SeriousBroker.it  This test is not yet approved or cleared by the [place] FDA and has been authorized for detection and/or diagnosis of SARS-CoV-2 by FDA under an Emergency Use Authorization (EUA). This EUA will remain in effect (meaning this test can be used) for the duration of the COVID-19 declaration under Section 564(b)(1) of the Act, 21 U.S.C. section 360bbb-3(b)(1), unless the authorization is terminated or revoked.  Performed at [facility], [address] [place], [place] [address]   Blood Culture (routine x 2)     Status: None   Collection Time: 07/22/23  2:25 AM   Specimen: BLOOD  Result Value Ref Range Status   Specimen Description BLOOD  RAC  Final   Special Requests   Final    BOTTLES DRAWN AEROBIC AND ANAEROBIC  Blood Culture results may not be optimal due to an excessive volume of blood received in culture bottles   Culture   Final    NO GROWTH 5 DAYS Performed at [facility], [address] [place], [place] [address]    Report Status 07/27/2023 FINAL  Final  Blood Culture (routine x 2)     Status: None   Collection Time: 07/22/23  2:26 AM   Specimen: BLOOD  Result Value Ref Range Status   Specimen Description BLOOD  LH  Final   Special Requests   Final    BOTTLES DRAWN AEROBIC AND ANAEROBIC Blood Culture adequate volume   Culture   Final    NO GROWTH 5 DAYS Performed at [facility], [address], [place], [place] [address]    Report Status 07/27/2023 FINAL  Final  C Difficile Quick Screen w PCR reflex     Status: None   Collection Time: 07/22/23 12:19 PM   Specimen: STOOL  Result Value Ref Range Status    C Diff antigen NEGATIVE NEGATIVE Final   C Diff toxin NEGATIVE NEGATIVE Final   C Diff interpretation No C. difficile detected.  Final    Comment: Performed at [facility], [address] [place], [place] [address]  Gastrointestinal Panel by PCR , Stool     Status: None   Collection Time: 07/22/23 12:19 PM   Specimen: Stool  Result Value Ref Range Status   Campylobacter species NOT DETECTED NOT DETECTED Final   Plesimonas shigelloides NOT DETECTED NOT DETECTED Final   Salmonella species NOT DETECTED NOT DETECTED Final   Yersinia enterocolitica NOT DETECTED NOT DETECTED Final   Vibrio species NOT DETECTED NOT DETECTED Final   Vibrio cholerae NOT DETECTED NOT DETECTED Final   Enteroaggregative E coli (EAEC) NOT DETECTED NOT DETECTED Final   Enteropathogenic E coli (EPEC) NOT DETECTED NOT DETECTED Final   Enterotoxigenic E coli (ETEC) NOT DETECTED NOT DETECTED Final   Shiga like toxin producing E coli (STEC) NOT DETECTED NOT DETECTED Final   Shigella/Enteroinvasive E coli (EIEC) NOT DETECTED NOT DETECTED Final   Cryptosporidium NOT DETECTED NOT DETECTED Final   Cyclospora cayetanensis NOT DETECTED NOT DETECTED Final   Entamoeba histolytica NOT DETECTED NOT DETECTED Final   Giardia lamblia NOT DETECTED NOT DETECTED Final   Adenovirus F40/41 NOT DETECTED NOT DETECTED Final   Astrovirus NOT DETECTED NOT DETECTED Final   Norovirus GI/GII NOT DETECTED NOT DETECTED Final   Rotavirus A NOT DETECTED NOT DETECTED Final   Sapovirus (I, II, IV, and V) NOT DETECTED NOT DETECTED Final    Comment: Performed at [facility], [address] [place], [place] [address]    Labs: CBC: Recent Labs  Lab 07/29/23 0524 07/30/23 0202 07/31/23 0341 08/01/23 0329 08/02/23 0550  WBC 1.8* 2.9* 4.6 3.5* 4.6  HGB 7.0* 8.3* 8.5* 8.9* 8.0*  HCT 19.8* 24.1* 25.1* 27.1* 24.2*  MCV 90.8 89.9 90.9 93.8 93.8  PLT 11* 46* 31* 36* 32*   Basic Metabolic Panel: Recent Labs  Lab  07/29/23 0524 07/30/23 0202 07/31/23 0341 08/01/23 0329 08/02/23 0550 08/03/23 0615  NA 133* 133* 133* 132* 126* 130*  K 2.9* 3.0* 3.3* 4.0 4.5 4.5  CL 100 102 104 101 97* 99  CO2 23 21* 24 23 24 25   GLUCOSE 111* 126* 125* 128* 100* 99  BUN 16 15 16 18 16 10   CREATININE 0.53 0.56 0.63 0.63 0.43* 0.64  CALCIUM 7.6* 7.6* 7.8* 7.8* 7.6* 7.3*  MG 2.1 2.0  --   --  1.9  --   PHOS 3.0  --   --   --  2.9  --    Liver Function Tests: Recent Labs  Lab 07/31/23 0341  AST 61*  ALT 99*  ALKPHOS 173*  BILITOT 0.6  PROT 4.7*  ALBUMIN 2.4*    Discharge time spent: greater than 30 minutes.  Signed: Enedina Finner, MD [facility] 08/04/2023

## 2023-08-05 MED FILL — Dexamethasone Sodium Phosphate Inj 100 MG/10ML: INTRAMUSCULAR | Qty: 1 | Status: AC

## 2023-08-06 ENCOUNTER — Other Ambulatory Visit: Payer: Self-pay

## 2023-08-08 ENCOUNTER — Inpatient Hospital Stay (HOSPITAL_BASED_OUTPATIENT_CLINIC_OR_DEPARTMENT_OTHER): Payer: 59 | Admitting: Oncology

## 2023-08-08 ENCOUNTER — Inpatient Hospital Stay: Payer: 59 | Attending: Oncology

## 2023-08-08 ENCOUNTER — Inpatient Hospital Stay: Payer: 59

## 2023-08-08 ENCOUNTER — Encounter: Payer: Self-pay | Admitting: Oncology

## 2023-08-08 VITALS — BP 128/85 | HR 115 | Temp 97.3°F | Resp 16 | Ht 66.0 in | Wt 164.7 lb

## 2023-08-08 DIAGNOSIS — G893 Neoplasm related pain (acute) (chronic): Secondary | ICD-10-CM | POA: Diagnosis not present

## 2023-08-08 DIAGNOSIS — E876 Hypokalemia: Secondary | ICD-10-CM | POA: Insufficient documentation

## 2023-08-08 DIAGNOSIS — G629 Polyneuropathy, unspecified: Secondary | ICD-10-CM | POA: Diagnosis not present

## 2023-08-08 DIAGNOSIS — R1907 Generalized intra-abdominal and pelvic swelling, mass and lump: Secondary | ICD-10-CM | POA: Diagnosis not present

## 2023-08-08 DIAGNOSIS — D696 Thrombocytopenia, unspecified: Secondary | ICD-10-CM | POA: Diagnosis not present

## 2023-08-08 DIAGNOSIS — C801 Malignant (primary) neoplasm, unspecified: Secondary | ICD-10-CM

## 2023-08-08 DIAGNOSIS — Z515 Encounter for palliative care: Secondary | ICD-10-CM | POA: Insufficient documentation

## 2023-08-08 DIAGNOSIS — I119 Hypertensive heart disease without heart failure: Secondary | ICD-10-CM | POA: Diagnosis not present

## 2023-08-08 DIAGNOSIS — C7951 Secondary malignant neoplasm of bone: Secondary | ICD-10-CM | POA: Diagnosis present

## 2023-08-08 DIAGNOSIS — E871 Hypo-osmolality and hyponatremia: Secondary | ICD-10-CM | POA: Diagnosis not present

## 2023-08-08 DIAGNOSIS — D649 Anemia, unspecified: Secondary | ICD-10-CM | POA: Insufficient documentation

## 2023-08-08 DIAGNOSIS — R590 Localized enlarged lymph nodes: Secondary | ICD-10-CM | POA: Diagnosis not present

## 2023-08-08 LAB — CBC WITH DIFFERENTIAL (CANCER CENTER ONLY)
Abs Immature Granulocytes: 0.06 10*3/uL (ref 0.00–0.07)
Basophils Absolute: 0 10*3/uL (ref 0.0–0.1)
Basophils Relative: 0 %
Eosinophils Absolute: 0 10*3/uL (ref 0.0–0.5)
Eosinophils Relative: 1 %
HCT: 26.3 % — ABNORMAL LOW (ref 36.0–46.0)
Hemoglobin: 8.3 g/dL — ABNORMAL LOW (ref 12.0–15.0)
Immature Granulocytes: 1 %
Lymphocytes Relative: 11 %
Lymphs Abs: 0.5 10*3/uL — ABNORMAL LOW (ref 0.7–4.0)
MCH: 31.1 pg (ref 26.0–34.0)
MCHC: 31.6 g/dL (ref 30.0–36.0)
MCV: 98.5 fL (ref 80.0–100.0)
Monocytes Absolute: 0.2 10*3/uL (ref 0.1–1.0)
Monocytes Relative: 3 %
Neutro Abs: 4.3 10*3/uL (ref 1.7–7.7)
Neutrophils Relative %: 84 %
Platelet Count: 83 10*3/uL — ABNORMAL LOW (ref 150–400)
RBC: 2.67 MIL/uL — ABNORMAL LOW (ref 3.87–5.11)
RDW: 17.4 % — ABNORMAL HIGH (ref 11.5–15.5)
WBC Count: 5.1 10*3/uL (ref 4.0–10.5)
nRBC: 0.6 % — ABNORMAL HIGH (ref 0.0–0.2)

## 2023-08-08 LAB — CMP (CANCER CENTER ONLY)
ALT: 26 U/L (ref 0–44)
AST: 35 U/L (ref 15–41)
Albumin: 2.7 g/dL — ABNORMAL LOW (ref 3.5–5.0)
Alkaline Phosphatase: 252 U/L — ABNORMAL HIGH (ref 38–126)
Anion gap: 7 (ref 5–15)
BUN: 9 mg/dL (ref 6–20)
CO2: 21 mmol/L — ABNORMAL LOW (ref 22–32)
Calcium: 8.2 mg/dL — ABNORMAL LOW (ref 8.9–10.3)
Chloride: 103 mmol/L (ref 98–111)
Creatinine: 0.82 mg/dL (ref 0.44–1.00)
GFR, Estimated: >60 mL/min (ref >60)
Glucose, Bld: 96 mg/dL (ref 70–99)
Potassium: 4.4 mmol/L (ref 3.5–5.1)
Sodium: 131 mmol/L — ABNORMAL LOW (ref 135–145)
Total Bilirubin: 0.5 mg/dL (ref 0.3–1.2)
Total Protein: 6.3 g/dL — ABNORMAL LOW (ref 6.5–8.1)

## 2023-08-08 NOTE — Progress Notes (Signed)
Patient complains of being very weak today. Does not want to do treatment.

## 2023-08-08 NOTE — Progress Notes (Signed)
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  INTERVAL HISTORY: Patient returns to clinic today for further evaluation, hospital follow-up, and consideration of cycle 8 of FOLFOX plus Keytruda.  She continues to have chronic pain, but this is better controlled on her current narcotic regimen.  She still has significant weakness and fatigue from her hospitalization.  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  She has a poor appetite.  She has no neurologic complaints.  She denies any recent fevers or illnesses.  She has no chest pain, shortness of breath, cough, or hemoptysis.  She denies any nausea, vomiting, constipation, or diarrhea.  She has no urinary complaints.  Patient offers no further specific complaints today.  REVIEW OF SYSTEMS:   Review of Systems  Constitutional:  Positive for malaise/fatigue. Negative for fever and weight loss.  Respiratory: Negative.  Negative for cough, hemoptysis and shortness of breath.   Cardiovascular: Negative.  Negative for chest pain and leg swelling.  Gastrointestinal: Negative.  Negative for abdominal pain.  Genitourinary: Negative.  Negative for dysuria and frequency.  Musculoskeletal:  Positive for back pain.  Skin: Negative.  Negative for rash.  Neurological:  Positive for weakness. Negative for dizziness, focal weakness and headaches.  Psychiatric/Behavioral: Negative.  The patient is not nervous/anxious.     As per HPI. Otherwise, a complete review  of systems is negative.  PAST MEDICAL HISTORY: Past Medical History:  Diagnosis Date   Anxiety    Cancer (HCC)    Depression    Heart murmur    Hypertension     PAST SURGICAL HISTORY: Past Surgical History:  Procedure Laterality Date   BIOPSY  07/26/2023   Procedure: BIOPSY;  Surgeon: Toney Reil, MD;  Location: [facility] ENDOSCOPY;  Service: Gastroenterology;;   CESAREAN SECTION     COLONOSCOPY WITH PROPOFOL N/A 02/21/2023   Procedure: COLONOSCOPY WITH PROPOFOL;  Surgeon: Regis Bill, MD;  Location: [facility] ENDOSCOPY;  Service: Endoscopy;  Laterality: N/A;   DILATION AND CURETTAGE OF UTERUS     FLEXIBLE SIGMOIDOSCOPY N/A 07/26/2023   Procedure: FLEXIBLE SIGMOIDOSCOPY;  Surgeon: Toney Reil, MD;  Location: [facility] ENDOSCOPY;  Service: Gastroenterology;  Laterality: N/A;   IR IMAGING GUIDED PORT INSERTION  02/16/2023    FAMILY HISTORY: Family History  Problem Relation Age of Onset   Cancer Maternal Grandmother     ADVANCED DIRECTIVES (Y/N):  N  HEALTH MAINTENANCE: Social History   Tobacco Use   Smoking status: Former    Current packs/day: 0.00    Types: Cigarettes    Quit date: 12/19/2022    Years since quitting: 0.6   Smokeless tobacco: Never   Tobacco comments:    Quit smoking 12/2022  Vaping Use   Vaping status: Never Used  Substance Use Topics   Alcohol use: Yes    Comment: occ   Drug use: No     Colonoscopy:  PAP:  Bone density:  Lipid panel:  Allergies  Allergen Reactions   Nifedipine Palpitations    Current Outpatient Medications  Medication Sig Dispense Refill   albuterol (  PROVENTIL) (2.5 MG/3ML) 0.083% nebulizer solution Take 2.5 mg by nebulization every 6 (six) hours as needed for wheezing or shortness of breath.     albuterol (VENTOLIN HFA) 108 (90 Base) MCG/ACT inhaler Inhale 2 puffs into the lungs every 6 (six) hours as needed.     busPIRone (BUSPAR) 5 MG tablet Take 5 mg by mouth 2 (two) times daily.     cyanocobalamin (VITAMIN  B12) 500 MCG tablet Take 500 mcg by mouth daily.     cyclobenzaprine (FEXMID) 7.5 MG tablet Take 1 tablet (7.5 mg total) by mouth 3 (three) times daily as needed for muscle spasms. 25 tablet 0   Doxepin HCl 3 MG TABS Take 1 tablet by mouth at bedtime.     DULoxetine (CYMBALTA) 60 MG capsule Take 60 mg by mouth daily.     feeding supplement (ENSURE ENLIVE / ENSURE PLUS) LIQD Take 237 mLs by mouth 3 (three) times daily between meals. 237 mL 12   gabapentin (NEURONTIN) 300 MG capsule Take 300 mg by mouth 3 (three) times daily.     hydrocortisone 2.5 % cream Apply 1 Application topically 2 (two) times daily.     lidocaine-prilocaine (EMLA) cream Apply to affected area once 30 g 3   megestrol (MEGACE) 40 MG tablet TAKE 1 TABLET BY MOUTH EVERY DAY 30 tablet 2   Multiple Vitamin (MULTIVITAMIN) tablet Take 1 tablet by mouth daily.     naloxone (NARCAN) nasal spray 4 mg/0.1 mL Place 1 spray into the nose once.     omeprazole (PRILOSEC) 20 MG capsule Take 20 mg by mouth daily.     ondansetron (ZOFRAN) 8 MG tablet Take 1 tablet (8 mg total) by mouth every 8 (eight) hours as needed for nausea or vomiting. Start on the third day after chemotherapy. 60 tablet 2   oxyCODONE-acetaminophen (PERCOCET) 10-325 MG tablet Take 1 tablet by mouth every 4 (four) hours as needed.     polyethylene glycol (MIRALAX / GLYCOLAX) 17 g packet Take 17 g by mouth daily as needed. 14 each 0   prochlorperazine (COMPAZINE) 10 MG tablet Take 1 tablet (10 mg total) by mouth every 6 (six) hours as needed for nausea or vomiting. 60 tablet 2   traMADol (ULTRAM-ER) 300 MG 24 hr tablet Take 300 mg by mouth daily.     valGANciclovir (VALCYTE) 450 MG tablet Take 2 tablets (900 mg total) by mouth 2 (two) times daily for 28 days. 112 tablet 0   Vitamin D, Ergocalciferol, (DRISDOL) 1.25 MG (50000 UNIT) CAPS capsule Take 50,000 Units by mouth every 7 (seven) days.     No current facility-administered medications for this visit.    Facility-Administered Medications Ordered in Other Visits  Medication Dose Route Frequency Provider Last Rate Last Admin   sodium chloride flush (NS) 0.9 % injection 10 mL  10 mL Intracatheter PRN Jeralyn Ruths, MD   10 mL at 06/29/23 1430    OBJECTIVE: Vitals:   08/08/23 0904  BP: 128/85  Pulse: (!) 115  Resp: 16  Temp: (!) 97.3 F (36.3 C)  SpO2: 100%      Body mass index is 26.58 kg/m.    ECOG FS:2 - Symptomatic, <50% confined to bed  General: Well-developed, well-nourished, no acute distress.  Sitting in a wheelchair. Eyes: Pink conjunctiva, anicteric sclera. HEENT: Normocephalic, moist mucous membranes. Lungs: No audible wheezing or coughing. Heart: Regular rate and rhythm. Abdomen: Soft, nontender, no obvious distention. Musculoskeletal: No edema, cyanosis, or clubbing. Neuro: Alert, answering  all questions appropriately. Cranial nerves grossly intact. Skin: No rashes or petechiae noted. Psych: Normal affect.   LAB RESULTS:  Lab Results  Component Value Date   NA 131 (L) 08/08/2023   K 4.4 08/08/2023   CL 103 08/08/2023   CO2 21 (L) 08/08/2023   GLUCOSE 96 08/08/2023   BUN 9 08/08/2023   CREATININE 0.82 08/08/2023   CALCIUM 8.2 (L) 08/08/2023   PROT 6.3 (L) 08/08/2023   ALBUMIN 2.7 (L) 08/08/2023   AST 35 08/08/2023   ALT 26 08/08/2023   ALKPHOS 252 (H) 08/08/2023   BILITOT 0.5 08/08/2023   GFRNONAA >60 08/08/2023   GFRAA >60 09/27/2018    Lab Results  Component Value Date   WBC 5.1 08/08/2023   NEUTROABS 4.3 08/08/2023   HGB 8.3 (L) 08/08/2023   HCT 26.3 (L) 08/08/2023   MCV 98.5 08/08/2023   PLT 83 (L) 08/08/2023     STUDIES: MR THORACIC SPINE W WO CONTRAST  Result Date: 07/30/2023 CLINICAL DATA:  Metastatic carcinoma of gastrointestinal primary. EXAM: MRI THORACIC WITHOUT AND WITH CONTRAST TECHNIQUE: Multiplanar and multiecho pulse sequences of the thoracic spine were obtained without and with intravenous contrast. CONTRAST:  7mL  GADAVIST GADOBUTROL 1 MMOL/ML IV SOLN COMPARISON:  None Available. FINDINGS: Alignment:  Normal Vertebrae: Widespread metastatic disease throughout the marrow spaces of every vertebral body level with involvement of the vertebral bodies and posterior elements. No pathologic fracture is seen. No evidence of epidural tumor. No evidence of metastatic disease to the cord. No foraminal disease identified in the thoracic region. Cord:  No evidence of metastatic disease to the cord is self. Paraspinal and other soft tissues: Posterior rib involvement is present as well. Disc levels: Disc degeneration and bulging at T7-8. Narrowing of the ventral subarachnoid space but no compression of the cord. IMPRESSION: 1. Widespread metastatic disease throughout the marrow spaces of every vertebral body level with involvement of the vertebral bodies and posterior elements. No pathologic fracture. No evidence of epidural tumor. No evidence of metastatic disease to the cord or neural foramina. 2. Disc degeneration and bulging at T7-8 with narrowing of the ventral subarachnoid space but no compression of the cord. Electronically Signed   By: Paulina Fusi M.D.   On: 07/30/2023 21:16   MR Lumbar Spine W Wo Contrast  Result Date: 07/30/2023 CLINICAL DATA:  Metastatic adenocarcinoma of gastrointestinal source. EXAM: MRI LUMBAR SPINE WITHOUT AND WITH CONTRAST TECHNIQUE: Multiplanar and multiecho pulse sequences of the lumbar spine were obtained without and with intravenous contrast. CONTRAST:  7mL GADAVIST GADOBUTROL 1 MMOL/ML IV SOLN COMPARISON:  04/17/2022 FINDINGS: Segmentation:  5 lumbar type vertebral bodies. Alignment:  Normal Vertebrae: Metastatic disease throughout the marrow spaces with involvement of every vertebral level and the sacrum. Iliac bones are also diffusely involved. The spinal involvement includes vertebral body involvement and posterior element involvement. No evidence of a pathologic fracture. Some extraosseous  extension of tumor along the left side of the L2 vertebral body. Early tumor involvement of the intervertebral foramina on the left at L1-2 and L2-3. Conus medullaris and cauda equina: Conus extends to the L1 level. No evidence of metastatic disease to the distal cord, conus tip or nerve roots. Paraspinal and other soft tissues: Otherwise negative Disc levels: No significant disc level pathology. IMPRESSION: 1. Widespread osseous metastatic disease throughout the spine, sacrum and iliac bones. No evidence of pathologic fracture. Some extraosseous extension of tumor along the left side of the L2 vertebral body. Early tumor involvement of the  intervertebral foramina on the left at L1-2 and L2-3. 2. No evidence of metastatic disease to the distal cord, conus tip or nerve roots. Electronically Signed   By: Paulina Fusi M.D.   On: 07/30/2023 21:13   MR CERVICAL SPINE W WO CONTRAST  Result Date: 07/30/2023 CLINICAL DATA:  Metastatic disease evaluation. 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 EXAM: MRI CERVICAL SPINE WITHOUT AND WITH CONTRAST TECHNIQUE: Multiplanar and multiecho pulse sequences of the cervical spine, to include the craniocervical junction and cervicothoracic junction, were obtained without and with intravenous contrast. CONTRAST:  7mL GADAVIST GADOBUTROL 1 MMOL/ML IV SOLN COMPARISON:  04/04/2022 FINDINGS: Alignment: Straightening of the normal cervical lordosis. Vertebrae and spinal cord: Metastatic disease throughout the region affecting all of the vertebral bodies, the clivus and the posterior elements. No evidence of extraosseous tumor causing neural encroachment. No metastatic disease to the spinal cord itself. Posterior Fossa, vertebral arteries, paraspinal tissues: Enlarged cervical lymph nodes on the left. Disc levels: Mild bulging of the disc. Involution of a previously seen herniation at C4-5. No compressive stenosis of the canal or foramina due to disc disease. IMPRESSION: 1.  Widespread osseous metastatic disease throughout the cervical spine, the clivus and the posterior elements. Involvement of every vertebral level. No evidence of extraosseous tumor causing neural encroachment. No evidence of metastatic disease to the spinal cord itself. 2. Enlarged cervical lymph nodes on the left consistent with metastatic disease. 3. Involution of a previously seen disc herniation at C4-5. No compressive stenosis of the canal or foramina due to degenerative disease. Electronically Signed   By: Paulina Fusi M.D.   On: 07/30/2023 21:09   US PELVIC COMPLETE W TRANSVAGINAL AND TORSION R/O  Result Date: 07/22/2023 CLINICAL DATA:  [id number] with pelvic pain for 5 days. History of metastatic adrenal carcinoma and steadily enlarging left adnexal/pelvic mass on CT with a normal left ovary not able to be distinguished from it. There was also evidence of distal descending and proximal to mid sigmoid colitis/diverticulitis on CT today. EXAM: TRANSABDOMINAL AND TRANSVAGINAL ULTRASOUND OF PELVIS DOPPLER ULTRASOUND OF OVARIES TECHNIQUE: Both transabdominal and transvaginal ultrasound examinations of the pelvis were performed. Transabdominal technique was performed for global imaging of the pelvis including uterus, ovaries, adnexal regions, and pelvic cul-de-sac. It was necessary to proceed with endovaginal exam following the transabdominal exam to visualize the uterus, right ovary and endometrium better. Color and duplex Doppler ultrasound was utilized to evaluate blood flow to the ovaries. COMPARISON:  CT with IV contrast today, PET-CT 07/06/2023, and earlier CT studies from this year back to 12/18/2022. FINDINGS: Uterus Measurements: Anteverted and heterogeneous measuring 7.2 x 3.8 x 4.3 cm = volume: 61.2 mL. No fibroids or other mass visualized. Incidentally noted is a 2 cm simple nabothian cysts in the cervix. Despite myometrial heterogeneity there is no appreciable focal wall mass. Endometrium Thickness:  5.7 mm.  No focal abnormality visualized. Right ovary Measurements: 3.0 x 1.4 x 2.0 = volume: 4.3 mL. Normal appearance/no adnexal mass. Left ovary There is a heterogeneous hypervascular left adnexal mass which is inseparable and indistinguishable from the left ovary. On CT this measured 5.4 x 4.8 cm. Ultrasound is 5.5 x 4.8 x 5.2 cm and 72 mL. 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 Pulsed Doppler evaluation of both ovaries demonstrates normal low-resistance arterial and venous waveforms. Other  findings Mild anechoic free fluid, which was also seen on CT. IMPRESSION: Heterogeneous hypervascular left adnexal mass which is inseparable from the left ovary. 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 Right ovary, uterus and endometrium are essentially unremarkable with incidental 2 cm simple nabothian cervical cyst. Mildly heterogeneous myometrium. Mild free fluid, nonspecific. Electronically Signed   By: Almira Bar M.D.   On: 07/22/2023 06:01   CT Angio Chest PE W and/or Wo Contrast  Result Date: 07/22/2023 CLINICAL DATA:  Weakness, emesis and diarrhea for the last several days. Currently taking chemotherapy and radiation for metastatic adrenal carcinoma. EXAM: CT ANGIOGRAPHY CHEST CT ABDOMEN AND PELVIS WITH CONTRAST TECHNIQUE: Multidetector CT imaging of the chest was performed using the standard protocol during bolus administration of intravenous contrast. Multiplanar CT image reconstructions and MIPs were obtained to evaluate the vascular anatomy. Multidetector CT imaging of the abdomen and pelvis was performed using the standard protocol  during bolus administration of intravenous contrast. RADIATION DOSE REDUCTION: This exam was performed according to the departmental dose-optimization program which includes automated exposure control, adjustment of the mA and/or kV according to patient size and/or use of iterative reconstruction technique. CONTRAST:  OMNIPAQUE IOHEXOL 350 MG/ML SOLN COMPARISON:  Portable chest today, PET-CT 07/06/2023, chest, abdomen and pelvis outside CT images only dated 02/03/2023. FINDINGS: CTA CHEST FINDINGS Cardiovascular: Pulmonary arteries are normal in caliber and do not show embolic filling defects. There is minimal atherosclerosis in the aortic arch. There is no aneurysm, stenosis or dissection. The great vessels are clear.  The pulmonary veins are nondistended. There is mild cardiomegaly with a left chamber predominance. There is a right chest port with IJ approach catheter with tip in the right atrium. There is a small pericardial effusion new from 07/06/2023. No visible coronary calcific plaques. Mediastinum/Nodes: Lower paratracheal space node measuring 8 mm in short axis on 4:45 and adjacent right-sided precarinal lymph node also 8 mm short axis. Subcarinal lymph node up to 9 mm short axis. All of these with uptake on PET-CT but no bulky adenopathy. No hilar adenopathy. In the left axilla multiple enlarged lymph nodes again are noted, with multifocal PET activity. Largest of these is 1.4 x 3.3 cm on 4:17, 2 x 1.5 cm on 4:20 and 2 x 1.5 cm on 4:31. There has been no change since 07/06/2023. There is an enlarged right axillary node measuring 1.3 x 1.8 cm on 4:34. Right cardiophrenic angle lymph nodes largest of these is 9 mm in short axis on 4:95. Lungs/Pleura: Mildly elevated right hemidiaphragm. Unchanged 6 mm pleural-based posterior left upper lobe nodule on 5:36. Unchanged 5 mm pleural-based nodule anteriorly in the left upper lobe on 5:49. There are no other appreciable nodules. There is no consolidation,  effusion or pneumothorax. 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 There are scattered thoracic spine endplate Schmorl's nodes but no acute compression fracture. Findings are more confluent and widespread than on 02/03/2023. Review of the MIP images confirms the above findings. CT ABDOMEN and PELVIS FINDINGS Hepatobiliary: No hepatic mass enhancement. Mild hepatic steatosis. Cholelithiasis without evidence of cholecystitis or biliary dilatation. Pancreas: No abnormality. Spleen: No abnormality. 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 Both kidneys enhance homogeneously. There is no urinary stone or obstruction. No bladder thickening. Stomach/Bowel: There is fluid in  the colon in the ascending and transverse segments. There are increasing thickened folds in the distal descending and proximal/mid sigmoid colon and adjacent stranding consistent with colitis or diverticulitis. There is no wall pneumatosis, free air, or abscess. Vascular/Lymphatic: Small hypermetabolic lymph nodes were noted on the recent PET-CT and have not changed. Examples include an aortocaval lymph node 7 mm short axis on 11: 39, left external iliac chain node is 9 mm short axis on 11:77, with scattered subcentimeter hypermetabolic inguinal lymph nodes also recently seen. By strict size criteria, majority of the hypermetabolic nodes are not frankly enlarged. There are slightly prominent left periaortic chain nodes up to 1 cm in short axis. Large heterogeneous left adnexal/pelvic mass again measures 5.4 x 4.8 cm on 11:72. This is about double the size it was on an earlier study this year on 12/18/2022. Reproductive: The uterus is intact. No new adnexal abnormality. 2 cm transmural fibroid of the right side of the fundus. Other: Small volume of pelvic ascites. No free hemorrhage, free air or localizing collections. Musculoskeletal: Diffuse osteoblastic metastatic  disease, also has progressed from 02/03/2023. No visible pathologic regional fracture. Review of the MIP images confirms the above findings. IMPRESSION: 1. No evidence of arterial embolus or acute chest process. 2. Small pericardial effusion, new from 07/06/2023. 3. Cardiomegaly without evidence of CHF. 4. Unchanged 6 mm and 5 mm left upper lobe nodules. 5. Bilateral axillary adenopathy, left greater than right, with borderline to slightly prominent mediastinal, retroperitoneal and pelvic lymph nodes. Positive PET activity on recent PET-CT. 6. 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 7. Increasing thickened folds in the distal descending and proximal/mid sigmoid colon with adjacent stranding consistent with colitis or diverticulitis. No wall pneumatosis, free air, or abscess. 8. 5.4 x 4.8 cm heterogeneous left adnexal/pelvic mass almost certainly metastatic, about double the size it was on an earlier study this year on 12/18/2022. 9. Small volume of pelvic ascites.  No free air. 10. Cholelithiasis without evidence of cholecystitis. 11. Mild hepatic steatosis. Electronically Signed   By: Almira Bar M.D.   On: 07/22/2023 04:40   CT ABDOMEN PELVIS W CONTRAST  Result Date: 07/22/2023 CLINICAL DATA:  Weakness, emesis and diarrhea for the last several days. Currently taking chemotherapy and radiation for metastatic adrenal carcinoma. EXAM: CT ANGIOGRAPHY CHEST CT ABDOMEN AND PELVIS WITH CONTRAST TECHNIQUE: Multidetector CT imaging of the chest was performed using the standard protocol during bolus administration of intravenous contrast. Multiplanar CT image reconstructions and MIPs were obtained to evaluate the vascular anatomy. Multidetector CT imaging of the abdomen and pelvis was performed using the standard protocol during bolus administration of intravenous contrast. RADIATION DOSE REDUCTION: This exam was performed according to the departmental dose-optimization program which  includes automated exposure control, adjustment of the mA and/or kV according to patient size and/or use of iterative reconstruction technique. CONTRAST:  OMNIPAQUE IOHEXOL 350 MG/ML SOLN COMPARISON:  Portable chest today, PET-CT 07/06/2023, chest, abdomen and pelvis outside CT images only dated 02/03/2023. FINDINGS: CTA CHEST FINDINGS Cardiovascular: Pulmonary arteries are normal in caliber and do not show embolic filling defects. There is minimal atherosclerosis in the aortic arch. There is no aneurysm, stenosis or dissection. The great vessels are clear.  The pulmonary veins are nondistended. There is mild cardiomegaly with a left chamber predominance. There is a right chest port with IJ approach catheter with tip in the right atrium. There is a small pericardial effusion new from 07/06/2023. No visible coronary calcific plaques. Mediastinum/Nodes: Lower paratracheal space  node measuring 8 mm in short axis on 4:45 and adjacent right-sided precarinal lymph node also 8 mm short axis. Subcarinal lymph node up to 9 mm short axis. All of these with uptake on PET-CT but no bulky adenopathy. No hilar adenopathy. In the left axilla multiple enlarged lymph nodes again are noted, with multifocal PET activity. Largest of these is 1.4 x 3.3 cm on 4:17, 2 x 1.5 cm on 4:20 and 2 x 1.5 cm on 4:31. There has been no change since 07/06/2023. There is an enlarged right axillary node measuring 1.3 x 1.8 cm on 4:34. Right cardiophrenic angle lymph nodes largest of these is 9 mm in short axis on 4:95. Lungs/Pleura: Mildly elevated right hemidiaphragm. Unchanged 6 mm pleural-based posterior left upper lobe nodule on 5:36. Unchanged 5 mm pleural-based nodule anteriorly in the left upper lobe on 5:49. There are no other appreciable nodules. There is no consolidation, effusion or pneumothorax. 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 There are scattered thoracic spine endplate Schmorl's nodes but  no acute compression fracture. Findings are more confluent and widespread than on 02/03/2023. Review of the MIP images confirms the above findings. CT ABDOMEN and PELVIS FINDINGS Hepatobiliary: No hepatic mass enhancement. Mild hepatic steatosis. Cholelithiasis without evidence of cholecystitis or biliary dilatation. Pancreas: No abnormality. Spleen: No abnormality. 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 Both kidneys enhance homogeneously. There is no urinary stone or obstruction. No bladder thickening. Stomach/Bowel: There is fluid in the colon in the ascending and transverse segments. There are increasing thickened folds in the distal descending and proximal/mid sigmoid colon and adjacent stranding consistent with colitis or diverticulitis. There is no wall pneumatosis, free air, or abscess. Vascular/Lymphatic: Small hypermetabolic lymph nodes were noted on the recent PET-CT and have not changed. Examples include an aortocaval lymph node 7 mm short axis on 11: 39, left external iliac chain node is 9 mm short axis on 11:77, with scattered subcentimeter hypermetabolic inguinal lymph nodes also recently seen. By strict size criteria, majority of the hypermetabolic nodes are not frankly enlarged. There are slightly prominent left periaortic chain nodes up to 1 cm in short axis. Large heterogeneous left adnexal/pelvic mass again measures 5.4 x 4.8 cm on 11:72. This is about double the size it was on an earlier study this year on 12/18/2022. Reproductive: The uterus is intact. No new adnexal abnormality. 2 cm transmural fibroid of the right side of the fundus. Other: Small volume of pelvic ascites. No free hemorrhage, free air or localizing collections. 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 No visible pathologic regional fracture. Review of the MIP images confirms the above findings. IMPRESSION: 1. No evidence  of arterial embolus or acute chest process. 2. Small pericardial effusion, new from 07/06/2023. 3. Cardiomegaly without evidence of CHF. 4. Unchanged 6 mm and 5 mm left upper lobe nodules. 5. Bilateral axillary adenopathy, left greater than right, with borderline to slightly prominent mediastinal, retroperitoneal and pelvic lymph nodes. Positive PET activity on recent PET-CT. 6. 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 7. Increasing thickened folds in the distal descending and proximal/mid sigmoid colon with adjacent stranding consistent with colitis or diverticulitis. No wall pneumatosis, free air, or abscess. 8. 5.4 x 4.8 cm heterogeneous left adnexal/pelvic mass almost certainly metastatic, about double the size it was on an earlier study this year on 12/18/2022. 9. Small volume of pelvic ascites.  No free air. 10. Cholelithiasis without evidence of  cholecystitis. 11. Mild hepatic steatosis. Electronically Signed   By: Almira Bar M.D.   On: 07/22/2023 04:40   DG Chest Port 1 View  Result Date: 07/22/2023 CLINICAL DATA:  Generalized weakness for several days, history of adrenal carcinoma EXAM: PORTABLE CHEST 1 VIEW COMPARISON:  PET-CT from 07/06/2019 FINDINGS: Cardiac shadow is within normal limits. Lungs are well aerated bilaterally. Right chest wall port is noted. Bony structures demonstrate increased sclerosis consistent with metastatic disease. IMPRESSION: Changes consistent with bony metastatic disease. These are stable from prior PET-CT. No acute abnormality noted. Electronically Signed   By: Alcide Clever M.D.   On: 07/22/2023 02:50    ONCOLOGY: Patient's workup was initiated at [facility] in February 2024 at which point PET scan revealed widely metastatic disease with innumerable sclerotic bony lesions as well as axillary, mediastinal, and abdominal lymphadenopathy.  Subsequent biopsy of a right axillary lymph node revealed signet ring adenocarcinoma, likely GI  origin.  Additional testing revealed high tumor mutational burden.  Further workup at [facility] in April 2024 revealed a negative EGD, and negative CT scanning of the brain.  CT scan of the abdomen and follow-up pelvic ultrasound revealed a large left adnexal mass that was not commented on PET scan from February.  CA 19-9 is normal, but CA125 and CEA are mildly elevated.    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  Repeat PET scan on February 24, 2023 reviewed independently confirming widespread metastatic disease.  Left adnexal mass is now PET positive of unclear clinical significance and after evaluation by gyn-onc, ultimately would not change the overall treatment plan.  Repeat PET scan on July 06, 2023 reviewed independently with stable to mildly progressive disease, but not significant enough to alter the treatment plan.  Patient is receiving FOLFOX plus Keytruda every 3 weeks for 12 cycles followed by maintenance Keytruda for at least 2 years.  Patient also received Zometa on even number cycles.  She has had had port placed.  Delay cycle 8 of treatment today secondary to decreased performance status and patient preference.  Return to clinic in 1 week for further evaluation and reconsideration of cycle 8.  Will reimage with PET scan in December 2024.   Adnexal mass: Repeat PET scan as above.  Awaiting pathology from outside biopsy for further evaluation.  Appreciate gyn-onc input. Pain: Chronic and unchanged.  Continue follow-up with her physician in the pain clinic in [place], [place].  Patient now completed XRT.   Bony disease: Continue Zometa on even number cycles as above. Transaminitis: Resolved.  Neither oxaliplatin nor 5-FU need to be dose reduced. Anemia: Chronic and unchanged.  Hemoglobin mildly improved since hospitalization at  8.3.  Neutropenia: Resolved.  Continue Fulphilia with pump removal as above. Thrombocytopenia: Patient platelet count is 83 today.  This is improved from her hospitalization.  Delay treatment 1 week as above. Weight loss: Resolved.  Continue Megace as prescribed. Hypertension: Well-controlled, monitor.   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 Patient is sexually active, menstruating, and not on birth control however. She verbalizes consent to proceed with treatment after previous discussion of potential life threatening or damaging effects to embryo. Megace not likely to cause ovarian suppression.  Patient also previously declined pelvic ultrasound to evaluate for pregnancy.  Patient previously discussed with nurse practitioner birth control including abstinence, condoms, OCPs, Depo-Lupron, vs  IUD. Discussed pros and cons of each. She will think about these and let us know how she would like to proceed.  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  Continue weekly labs and follow-up with ID as indicated. Coping/anxiety: Patient was given a [facility].  Patient expressed understanding and was in agreement with this plan. She also understands that She can call clinic at any time with any questions, concerns, or complaints.    Cancer Staging  Signet ring cell adenocarcinoma [facility]) Staging form: Exocrine Pancreas, AJCC 8th Edition - Clinical stage from 02/07/2023: Stage IV (cTX, cNX, pM1) - Signed by Jeralyn Ruths, MD on 02/14/2023 Stage prefix: Initial diagnosis   Jeralyn Ruths, MD   08/08/2023 12:07 PM

## 2023-08-09 ENCOUNTER — Other Ambulatory Visit: Payer: Self-pay

## 2023-08-10 ENCOUNTER — Inpatient Hospital Stay: Payer: 59

## 2023-08-11 ENCOUNTER — Other Ambulatory Visit: Payer: Self-pay | Admitting: Oncology

## 2023-08-11 ENCOUNTER — Inpatient Hospital Stay: Payer: 59

## 2023-08-11 ENCOUNTER — Other Ambulatory Visit: Payer: Self-pay | Admitting: *Deleted

## 2023-08-11 ENCOUNTER — Encounter: Payer: Self-pay | Admitting: Oncology

## 2023-08-11 DIAGNOSIS — C801 Malignant (primary) neoplasm, unspecified: Secondary | ICD-10-CM

## 2023-08-11 DIAGNOSIS — C7951 Secondary malignant neoplasm of bone: Secondary | ICD-10-CM | POA: Diagnosis not present

## 2023-08-11 LAB — CMP (CANCER CENTER ONLY)
ALT: 38 U/L (ref 0–44)
AST: 50 U/L — ABNORMAL HIGH (ref 15–41)
Albumin: 2.7 g/dL — ABNORMAL LOW (ref 3.5–5.0)
Alkaline Phosphatase: 408 U/L — ABNORMAL HIGH (ref 38–126)
Anion gap: 7 (ref 5–15)
BUN: 8 mg/dL (ref 6–20)
CO2: 21 mmol/L — ABNORMAL LOW (ref 22–32)
Calcium: 8.4 mg/dL — ABNORMAL LOW (ref 8.9–10.3)
Chloride: 105 mmol/L (ref 98–111)
Creatinine: 0.78 mg/dL (ref 0.44–1.00)
GFR, Estimated: >60 mL/min (ref >60)
Glucose, Bld: 111 mg/dL — ABNORMAL HIGH (ref 70–99)
Potassium: 4.4 mmol/L (ref 3.5–5.1)
Sodium: 133 mmol/L — ABNORMAL LOW (ref 135–145)
Total Bilirubin: 0.5 mg/dL (ref 0.3–1.2)
Total Protein: 6.5 g/dL (ref 6.5–8.1)

## 2023-08-11 LAB — CBC WITH DIFFERENTIAL (CANCER CENTER ONLY)
Abs Immature Granulocytes: 0.04 10*3/uL (ref 0.00–0.07)
Basophils Absolute: 0 10*3/uL (ref 0.0–0.1)
Basophils Relative: 0 %
Eosinophils Absolute: 0 10*3/uL (ref 0.0–0.5)
Eosinophils Relative: 0 %
HCT: 25.3 % — ABNORMAL LOW (ref 36.0–46.0)
Hemoglobin: 8.2 g/dL — ABNORMAL LOW (ref 12.0–15.0)
Immature Granulocytes: 1 %
Lymphocytes Relative: 8 %
Lymphs Abs: 0.4 10*3/uL — ABNORMAL LOW (ref 0.7–4.0)
MCH: 31.5 pg (ref 26.0–34.0)
MCHC: 32.4 g/dL (ref 30.0–36.0)
MCV: 97.3 fL (ref 80.0–100.0)
Monocytes Absolute: 0.2 10*3/uL (ref 0.1–1.0)
Monocytes Relative: 4 %
Neutro Abs: 3.9 10*3/uL (ref 1.7–7.7)
Neutrophils Relative %: 87 %
Platelet Count: 81 10*3/uL — ABNORMAL LOW (ref 150–400)
RBC: 2.6 MIL/uL — ABNORMAL LOW (ref 3.87–5.11)
RDW: 17.5 % — ABNORMAL HIGH (ref 11.5–15.5)
WBC Count: 4.5 10*3/uL (ref 4.0–10.5)
nRBC: 0.4 % — ABNORMAL HIGH (ref 0.0–0.2)

## 2023-08-11 LAB — PREPARE RBC (CROSSMATCH)

## 2023-08-11 LAB — PREGNANCY, URINE: Preg Test, Ur: POSITIVE — AB

## 2023-08-12 ENCOUNTER — Inpatient Hospital Stay: Payer: 59

## 2023-08-12 DIAGNOSIS — C801 Malignant (primary) neoplasm, unspecified: Secondary | ICD-10-CM

## 2023-08-12 DIAGNOSIS — C7951 Secondary malignant neoplasm of bone: Secondary | ICD-10-CM | POA: Diagnosis not present

## 2023-08-12 LAB — CMV DNA, QUANTITATIVE, PCR
CMV DNA Quant: POSITIVE [IU]/mL
Log10 CMV Qn DNA Pl: UNDETERMINED {Log}

## 2023-08-12 MED ORDER — DIPHENHYDRAMINE HCL 50 MG/ML IJ SOLN
25.0000 mg | Freq: Once | INTRAMUSCULAR | Status: AC
  Administered 2023-08-12: 25 mg via INTRAVENOUS
  Filled 2023-08-12: qty 1

## 2023-08-12 MED ORDER — HEPARIN SOD (PORK) LOCK FLUSH 100 UNIT/ML IV SOLN
250.0000 [IU] | INTRAVENOUS | Status: DC | PRN
  Filled 2023-08-12: qty 5

## 2023-08-12 MED ORDER — SODIUM CHLORIDE 0.9% IV SOLUTION
250.0000 mL | Freq: Once | INTRAVENOUS | Status: AC
  Administered 2023-08-12: 50 mL via INTRAVENOUS
  Filled 2023-08-12: qty 250

## 2023-08-12 MED ORDER — ACETAMINOPHEN 325 MG PO TABS
650.0000 mg | ORAL_TABLET | Freq: Once | ORAL | Status: AC
  Administered 2023-08-12: 650 mg via ORAL
  Filled 2023-08-12: qty 2

## 2023-08-12 MED FILL — Dexamethasone Sodium Phosphate Inj 100 MG/10ML: INTRAMUSCULAR | Qty: 1 | Status: AC

## 2023-08-12 NOTE — Patient Instructions (Signed)

## 2023-08-13 LAB — BETA HCG QUANT (REF LAB): hCG Quant: 57 m[IU]/mL

## 2023-08-13 LAB — TYPE AND SCREEN
ABO/RH(D): O POS
Antibody Screen: NEGATIVE
Unit division: 0

## 2023-08-13 LAB — BPAM RBC
Blood Product Expiration Date: 202411092359
ISSUE DATE / TIME: 202410111133
Unit Type and Rh: 5100

## 2023-08-15 ENCOUNTER — Encounter: Payer: Self-pay | Admitting: Oncology

## 2023-08-15 ENCOUNTER — Inpatient Hospital Stay: Payer: 59

## 2023-08-15 ENCOUNTER — Inpatient Hospital Stay (HOSPITAL_BASED_OUTPATIENT_CLINIC_OR_DEPARTMENT_OTHER): Payer: 59 | Admitting: Oncology

## 2023-08-15 VITALS — BP 130/86 | HR 115 | Temp 98.3°F | Resp 18 | Ht 66.0 in | Wt 162.0 lb

## 2023-08-15 DIAGNOSIS — C801 Malignant (primary) neoplasm, unspecified: Secondary | ICD-10-CM

## 2023-08-15 DIAGNOSIS — C7951 Secondary malignant neoplasm of bone: Secondary | ICD-10-CM | POA: Diagnosis not present

## 2023-08-15 LAB — CBC WITH DIFFERENTIAL/PLATELET
Abs Immature Granulocytes: 0.05 10*3/uL (ref 0.00–0.07)
Basophils Absolute: 0 10*3/uL (ref 0.0–0.1)
Basophils Relative: 1 %
Eosinophils Absolute: 0 10*3/uL (ref 0.0–0.5)
Eosinophils Relative: 0 %
HCT: 28.2 % — ABNORMAL LOW (ref 36.0–46.0)
Hemoglobin: 9.1 g/dL — ABNORMAL LOW (ref 12.0–15.0)
Immature Granulocytes: 1 %
Lymphocytes Relative: 7 %
Lymphs Abs: 0.4 10*3/uL — ABNORMAL LOW (ref 0.7–4.0)
MCH: 30.7 pg (ref 26.0–34.0)
MCHC: 32.3 g/dL (ref 30.0–36.0)
MCV: 95.3 fL (ref 80.0–100.0)
Monocytes Absolute: 0.2 10*3/uL (ref 0.1–1.0)
Monocytes Relative: 4 %
Neutro Abs: 5.3 10*3/uL (ref 1.7–7.7)
Neutrophils Relative %: 87 %
Platelets: 112 10*3/uL — ABNORMAL LOW (ref 150–400)
RBC: 2.96 MIL/uL — ABNORMAL LOW (ref 3.87–5.11)
RDW: 17.1 % — ABNORMAL HIGH (ref 11.5–15.5)
WBC: 6 10*3/uL (ref 4.0–10.5)
nRBC: 0 % (ref 0.0–0.2)

## 2023-08-15 LAB — CMP (CANCER CENTER ONLY)
ALT: 17 U/L (ref 0–44)
AST: 25 U/L (ref 15–41)
Albumin: 2.6 g/dL — ABNORMAL LOW (ref 3.5–5.0)
Alkaline Phosphatase: 283 U/L — ABNORMAL HIGH (ref 38–126)
Anion gap: 7 (ref 5–15)
BUN: 11 mg/dL (ref 6–20)
CO2: 22 mmol/L (ref 22–32)
Calcium: 8.3 mg/dL — ABNORMAL LOW (ref 8.9–10.3)
Chloride: 101 mmol/L (ref 98–111)
Creatinine: 0.75 mg/dL (ref 0.44–1.00)
GFR, Estimated: >60 mL/min (ref >60)
Glucose, Bld: 86 mg/dL (ref 70–99)
Potassium: 3.4 mmol/L — ABNORMAL LOW (ref 3.5–5.1)
Sodium: 130 mmol/L — ABNORMAL LOW (ref 135–145)
Total Bilirubin: 0.5 mg/dL (ref 0.3–1.2)
Total Protein: 6.5 g/dL (ref 6.5–8.1)

## 2023-08-15 NOTE — Progress Notes (Signed)
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  INTERVAL HISTORY: Patient returns to clinic today for further evaluation and reconsideration of cycle 8 of FOLFOX plus Keytruda.  She continues to have a decreased performance status, poor appetite, and increased weakness and fatigue.  Patient states yesterday she had a significant amount of diarrhea, but none earlier in the week.  She continues to have chronic pain, but this is better controlled on her current narcotic regimen.  She has no neurologic complaints.  She denies any recent fevers or illnesses.  She has no chest pain, shortness of breath, cough, or hemoptysis.  She denies any nausea or vomiting.  She has no urinary complaints.  Patient offers no further specific complaints today.  REVIEW OF SYSTEMS:   Review of Systems  Constitutional:  Positive for malaise/fatigue. Negative for fever and weight loss.  Respiratory: Negative.  Negative for cough, hemoptysis and shortness of breath.   Cardiovascular: Negative.  Negative for chest pain and leg swelling.  Gastrointestinal:  Positive for diarrhea. Negative for abdominal pain.  Genitourinary: Negative.  Negative for dysuria and frequency.  Musculoskeletal:  Positive for back pain.  Skin: Negative.  Negative for rash.  Neurological:  Positive for weakness. Negative for dizziness, focal weakness and headaches.  Psychiatric/Behavioral: Negative.  The patient is not nervous/anxious.     As per HPI. Otherwise, a complete review of systems is  negative.  PAST MEDICAL HISTORY: Past Medical History:  Diagnosis Date   Anxiety    Cancer (HCC)    Depression    Heart murmur    Hypertension     PAST SURGICAL HISTORY: Past Surgical History:  Procedure Laterality Date   BIOPSY  07/26/2023   Procedure: BIOPSY;  Surgeon: Toney Reil, MD;  Location: [facility] ENDOSCOPY;  Service: Gastroenterology;;   CESAREAN SECTION     COLONOSCOPY WITH PROPOFOL N/A 02/21/2023   Procedure: COLONOSCOPY WITH PROPOFOL;  Surgeon: Regis Bill, MD;  Location: [facility] ENDOSCOPY;  Service: Endoscopy;  Laterality: N/A;   DILATION AND CURETTAGE OF UTERUS     FLEXIBLE SIGMOIDOSCOPY N/A 07/26/2023   Procedure: FLEXIBLE SIGMOIDOSCOPY;  Surgeon: Toney Reil, MD;  Location: [facility] ENDOSCOPY;  Service: Gastroenterology;  Laterality: N/A;   IR IMAGING GUIDED PORT INSERTION  02/16/2023    FAMILY HISTORY: Family History  Problem Relation Age of Onset   Cancer Maternal Grandmother     ADVANCED DIRECTIVES (Y/N):  N  HEALTH MAINTENANCE: Social History   Tobacco Use   Smoking status: Former    Current packs/day: 0.00    Types: Cigarettes    Quit date: 12/19/2022    Years since quitting: 0.6   Smokeless tobacco: Never   Tobacco comments:    Quit smoking 12/2022  Vaping Use   Vaping status: Never Used  Substance Use Topics   Alcohol use: Yes    Comment: occ   Drug use: No     Colonoscopy:  PAP:  Bone density:  Lipid panel:  Allergies  Allergen Reactions   Nifedipine Palpitations    Current Outpatient Medications  Medication Sig Dispense Refill   albuterol (PROVENTIL) (2.5  MG/3ML) 0.083% nebulizer solution Take 2.5 mg by nebulization every 6 (six) hours as needed for wheezing or shortness of breath.     albuterol (VENTOLIN HFA) 108 (90 Base) MCG/ACT inhaler Inhale 2 puffs into the lungs every 6 (six) hours as needed.     busPIRone (BUSPAR) 5 MG tablet Take 5 mg by mouth 2 (two) times daily.     cyanocobalamin (VITAMIN B12) 500 MCG  tablet Take 500 mcg by mouth daily.     cyclobenzaprine (FEXMID) 7.5 MG tablet Take 1 tablet (7.5 mg total) by mouth 3 (three) times daily as needed for muscle spasms. 25 tablet 0   Doxepin HCl 3 MG TABS Take 1 tablet by mouth at bedtime.     DULoxetine (CYMBALTA) 60 MG capsule Take 60 mg by mouth daily.     feeding supplement (ENSURE ENLIVE / ENSURE PLUS) LIQD Take 237 mLs by mouth 3 (three) times daily between meals. 237 mL 12   gabapentin (NEURONTIN) 300 MG capsule Take 300 mg by mouth 3 (three) times daily.     hydrocortisone 2.5 % cream Apply 1 Application topically 2 (two) times daily.     lidocaine-prilocaine (EMLA) cream Apply to affected area once 30 g 3   megestrol (MEGACE) 40 MG tablet TAKE 1 TABLET BY MOUTH EVERY DAY 30 tablet 2   Multiple Vitamin (MULTIVITAMIN) tablet Take 1 tablet by mouth daily.     naloxone (NARCAN) nasal spray 4 mg/0.1 mL Place 1 spray into the nose once.     omeprazole (PRILOSEC) 20 MG capsule Take 20 mg by mouth daily.     ondansetron (ZOFRAN) 8 MG tablet Take 1 tablet (8 mg total) by mouth every 8 (eight) hours as needed for nausea or vomiting. Start on the third day after chemotherapy. 60 tablet 2   oxyCODONE-acetaminophen (PERCOCET) 10-325 MG tablet Take 1 tablet by mouth every 4 (four) hours as needed.     polyethylene glycol (MIRALAX / GLYCOLAX) 17 g packet Take 17 g by mouth daily as needed. 14 each 0   prochlorperazine (COMPAZINE) 10 MG tablet Take 1 tablet (10 mg total) by mouth every 6 (six) hours as needed for nausea or vomiting. 60 tablet 2   traMADol (ULTRAM-ER) 300 MG 24 hr tablet Take 300 mg by mouth daily.     valGANciclovir (VALCYTE) 450 MG tablet Take 2 tablets (900 mg total) by mouth 2 (two) times daily for 28 days. 112 tablet 0   Vitamin D, Ergocalciferol, (DRISDOL) 1.25 MG (50000 UNIT) CAPS capsule Take 50,000 Units by mouth every 7 (seven) days.     No current facility-administered medications for this visit.   Facility-Administered  Medications Ordered in Other Visits  Medication Dose Route Frequency Provider Last Rate Last Admin   sodium chloride flush (NS) 0.9 % injection 10 mL  10 mL Intracatheter PRN Jeralyn Ruths, MD   10 mL at 06/29/23 1430    OBJECTIVE: Vitals:   08/15/23 0849  BP: 130/86  Pulse: (!) 115  Resp: 18  Temp: 98.3 F (36.8 C)  SpO2: 100%      Body mass index is 26.15 kg/m.    ECOG FS:2 - Symptomatic, <50% confined to bed  General: Well-developed, well-nourished, no acute distress.  Sitting in a wheelchair. Eyes: Pink conjunctiva, anicteric sclera. HEENT: Normocephalic, moist mucous membranes. Lungs: No audible wheezing or coughing. Heart: Regular rate and rhythm. Abdomen: Soft, nontender, no obvious distention. Musculoskeletal: No edema, cyanosis, or clubbing. Neuro: Alert, answering all questions appropriately.  Cranial nerves grossly intact. Skin: No rashes or petechiae noted. Psych: Normal affect.   LAB RESULTS:  Lab Results  Component Value Date   NA 130 (L) 08/15/2023   K 3.4 (L) 08/15/2023   CL 101 08/15/2023   CO2 22 08/15/2023   GLUCOSE 86 08/15/2023   BUN 11 08/15/2023   CREATININE 0.75 08/15/2023   CALCIUM 8.3 (L) 08/15/2023   PROT 6.5 08/15/2023   ALBUMIN 2.6 (L) 08/15/2023   AST 25 08/15/2023   ALT 17 08/15/2023   ALKPHOS 283 (H) 08/15/2023   BILITOT 0.5 08/15/2023   GFRNONAA >60 08/15/2023   GFRAA >60 09/27/2018    Lab Results  Component Value Date   WBC 6.0 08/15/2023   NEUTROABS 5.3 08/15/2023   HGB 9.1 (L) 08/15/2023   HCT 28.2 (L) 08/15/2023   MCV 95.3 08/15/2023   PLT 112 (L) 08/15/2023     STUDIES: MR THORACIC SPINE W WO CONTRAST  Result Date: 07/30/2023 CLINICAL DATA:  Metastatic carcinoma of gastrointestinal primary. EXAM: MRI THORACIC WITHOUT AND WITH CONTRAST TECHNIQUE: Multiplanar and multiecho pulse sequences of the thoracic spine were obtained without and with intravenous contrast. CONTRAST:  7mL GADAVIST GADOBUTROL 1 MMOL/ML IV  SOLN COMPARISON:  None Available. FINDINGS: Alignment:  Normal Vertebrae: Widespread metastatic disease throughout the marrow spaces of every vertebral body level with involvement of the vertebral bodies and posterior elements. No pathologic fracture is seen. No evidence of epidural tumor. No evidence of metastatic disease to the cord. No foraminal disease identified in the thoracic region. Cord:  No evidence of metastatic disease to the cord is self. Paraspinal and other soft tissues: Posterior rib involvement is present as well. Disc levels: Disc degeneration and bulging at T7-8. Narrowing of the ventral subarachnoid space but no compression of the cord. IMPRESSION: 1. Widespread metastatic disease throughout the marrow spaces of every vertebral body level with involvement of the vertebral bodies and posterior elements. No pathologic fracture. No evidence of epidural tumor. No evidence of metastatic disease to the cord or neural foramina. 2. Disc degeneration and bulging at T7-8 with narrowing of the ventral subarachnoid space but no compression of the cord. Electronically Signed   By: Paulina Fusi M.D.   On: 07/30/2023 21:16   MR Lumbar Spine W Wo Contrast  Result Date: 07/30/2023 CLINICAL DATA:  Metastatic adenocarcinoma of gastrointestinal source. EXAM: MRI LUMBAR SPINE WITHOUT AND WITH CONTRAST TECHNIQUE: Multiplanar and multiecho pulse sequences of the lumbar spine were obtained without and with intravenous contrast. CONTRAST:  7mL GADAVIST GADOBUTROL 1 MMOL/ML IV SOLN COMPARISON:  04/17/2022 FINDINGS: Segmentation:  5 lumbar type vertebral bodies. Alignment:  Normal Vertebrae: Metastatic disease throughout the marrow spaces with involvement of every vertebral level and the sacrum. Iliac bones are also diffusely involved. The spinal involvement includes vertebral body involvement and posterior element involvement. No evidence of a pathologic fracture. Some extraosseous extension of tumor along the left  side of the L2 vertebral body. Early tumor involvement of the intervertebral foramina on the left at L1-2 and L2-3. Conus medullaris and cauda equina: Conus extends to the L1 level. No evidence of metastatic disease to the distal cord, conus tip or nerve roots. Paraspinal and other soft tissues: Otherwise negative Disc levels: No significant disc level pathology. IMPRESSION: 1. Widespread osseous metastatic disease throughout the spine, sacrum and iliac bones. No evidence of pathologic fracture. Some extraosseous extension of tumor along the left side of the L2 vertebral body. Early tumor involvement of the intervertebral foramina on the  left at L1-2 and L2-3. 2. No evidence of metastatic disease to the distal cord, conus tip or nerve roots. Electronically Signed   By: Paulina Fusi M.D.   On: 07/30/2023 21:13   MR CERVICAL SPINE W WO CONTRAST  Result Date: 07/30/2023 CLINICAL DATA:  Metastatic disease evaluation. 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 EXAM: MRI CERVICAL SPINE WITHOUT AND WITH CONTRAST TECHNIQUE: Multiplanar and multiecho pulse sequences of the cervical spine, to include the craniocervical junction and cervicothoracic junction, were obtained without and with intravenous contrast. CONTRAST:  7mL GADAVIST GADOBUTROL 1 MMOL/ML IV SOLN COMPARISON:  04/04/2022 FINDINGS: Alignment: Straightening of the normal cervical lordosis. Vertebrae and spinal cord: Metastatic disease throughout the region affecting all of the vertebral bodies, the clivus and the posterior elements. No evidence of extraosseous tumor causing neural encroachment. No metastatic disease to the spinal cord itself. Posterior Fossa, vertebral arteries, paraspinal tissues: Enlarged cervical lymph nodes on the left. Disc levels: Mild bulging of the disc. Involution of a previously seen herniation at C4-5. No compressive stenosis of the canal or foramina due to disc disease. IMPRESSION: 1. Widespread osseous metastatic disease  throughout the cervical spine, the clivus and the posterior elements. Involvement of every vertebral level. No evidence of extraosseous tumor causing neural encroachment. No evidence of metastatic disease to the spinal cord itself. 2. Enlarged cervical lymph nodes on the left consistent with metastatic disease. 3. Involution of a previously seen disc herniation at C4-5. No compressive stenosis of the canal or foramina due to degenerative disease. Electronically Signed   By: Paulina Fusi M.D.   On: 07/30/2023 21:09   US PELVIC COMPLETE W TRANSVAGINAL AND TORSION R/O  Result Date: 07/22/2023 CLINICAL DATA:  [id number] with pelvic pain for 5 days. History of metastatic adrenal carcinoma and steadily enlarging left adnexal/pelvic mass on CT with a normal left ovary not able to be distinguished from it. There was also evidence of distal descending and proximal to mid sigmoid colitis/diverticulitis on CT today. EXAM: TRANSABDOMINAL AND TRANSVAGINAL ULTRASOUND OF PELVIS DOPPLER ULTRASOUND OF OVARIES TECHNIQUE: Both transabdominal and transvaginal ultrasound examinations of the pelvis were performed. Transabdominal technique was performed for global imaging of the pelvis including uterus, ovaries, adnexal regions, and pelvic cul-de-sac. It was necessary to proceed with endovaginal exam following the transabdominal exam to visualize the uterus, right ovary and endometrium better. Color and duplex Doppler ultrasound was utilized to evaluate blood flow to the ovaries. COMPARISON:  CT with IV contrast today, PET-CT 07/06/2023, and earlier CT studies from this year back to 12/18/2022. FINDINGS: Uterus Measurements: Anteverted and heterogeneous measuring 7.2 x 3.8 x 4.3 cm = volume: 61.2 mL. No fibroids or other mass visualized. Incidentally noted is a 2 cm simple nabothian cysts in the cervix. Despite myometrial heterogeneity there is no appreciable focal wall mass. Endometrium Thickness: 5.7 mm.  No focal abnormality  visualized. Right ovary Measurements: 3.0 x 1.4 x 2.0 = volume: 4.3 mL. Normal appearance/no adnexal mass. Left ovary There is a heterogeneous hypervascular left adnexal mass which is inseparable and indistinguishable from the left ovary. On CT this measured 5.4 x 4.8 cm. Ultrasound is 5.5 x 4.8 x 5.2 cm and 72 mL. 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 Pulsed Doppler evaluation of both ovaries demonstrates normal low-resistance arterial and venous waveforms. Other findings Mild anechoic free  fluid, which was also seen on CT. IMPRESSION: Heterogeneous hypervascular left adnexal mass which is inseparable from the left ovary. 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 Right ovary, uterus and endometrium are essentially unremarkable with incidental 2 cm simple nabothian cervical cyst. Mildly heterogeneous myometrium. Mild free fluid, nonspecific. Electronically Signed   By: Almira Bar M.D.   On: 07/22/2023 06:01   CT Angio Chest PE W and/or Wo Contrast  Result Date: 07/22/2023 CLINICAL DATA:  Weakness, emesis and diarrhea for the last several days. Currently taking chemotherapy and radiation for metastatic adrenal carcinoma. EXAM: CT ANGIOGRAPHY CHEST CT ABDOMEN AND PELVIS WITH CONTRAST TECHNIQUE: Multidetector CT imaging of the chest was performed using the standard protocol during bolus administration of intravenous contrast. Multiplanar CT image reconstructions and MIPs were obtained to evaluate the vascular anatomy. Multidetector CT imaging of the abdomen and pelvis was performed using the standard protocol during bolus administration of  intravenous contrast. RADIATION DOSE REDUCTION: This exam was performed according to the departmental dose-optimization program which includes automated exposure control, adjustment of the mA and/or kV according to patient size and/or use of iterative reconstruction technique. CONTRAST:  OMNIPAQUE IOHEXOL 350 MG/ML SOLN COMPARISON:  Portable chest today, PET-CT 07/06/2023, chest, abdomen and pelvis outside CT images only dated 02/03/2023. FINDINGS: CTA CHEST FINDINGS Cardiovascular: Pulmonary arteries are normal in caliber and do not show embolic filling defects. There is minimal atherosclerosis in the aortic arch. There is no aneurysm, stenosis or dissection. The great vessels are clear.  The pulmonary veins are nondistended. There is mild cardiomegaly with a left chamber predominance. There is a right chest port with IJ approach catheter with tip in the right atrium. There is a small pericardial effusion new from 07/06/2023. No visible coronary calcific plaques. Mediastinum/Nodes: Lower paratracheal space node measuring 8 mm in short axis on 4:45 and adjacent right-sided precarinal lymph node also 8 mm short axis. Subcarinal lymph node up to 9 mm short axis. All of these with uptake on PET-CT but no bulky adenopathy. No hilar adenopathy. In the left axilla multiple enlarged lymph nodes again are noted, with multifocal PET activity. Largest of these is 1.4 x 3.3 cm on 4:17, 2 x 1.5 cm on 4:20 and 2 x 1.5 cm on 4:31. There has been no change since 07/06/2023. There is an enlarged right axillary node measuring 1.3 x 1.8 cm on 4:34. Right cardiophrenic angle lymph nodes largest of these is 9 mm in short axis on 4:95. Lungs/Pleura: Mildly elevated right hemidiaphragm. Unchanged 6 mm pleural-based posterior left upper lobe nodule on 5:36. Unchanged 5 mm pleural-based nodule anteriorly in the left upper lobe on 5:49. There are no other appreciable nodules. There is no consolidation, effusion or pneumothorax.  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 There are scattered thoracic spine endplate Schmorl's nodes but no acute compression fracture. Findings are more confluent and widespread than on 02/03/2023. Review of the MIP images confirms the above findings. CT ABDOMEN and PELVIS FINDINGS Hepatobiliary: No hepatic mass enhancement. Mild hepatic steatosis. Cholelithiasis without evidence of cholecystitis or biliary dilatation. Pancreas: No abnormality. Spleen: No abnormality. 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 Both kidneys enhance homogeneously. There is no urinary stone or obstruction. No bladder thickening. Stomach/Bowel: There is fluid in the colon in the  ascending and transverse segments. There are increasing thickened folds in the distal descending and proximal/mid sigmoid colon and adjacent stranding consistent with colitis or diverticulitis. There is no wall pneumatosis, free air, or abscess. Vascular/Lymphatic: Small hypermetabolic lymph nodes were noted on the recent PET-CT and have not changed. Examples include an aortocaval lymph node 7 mm short axis on 11: 39, left external iliac chain node is 9 mm short axis on 11:77, with scattered subcentimeter hypermetabolic inguinal lymph nodes also recently seen. By strict size criteria, majority of the hypermetabolic nodes are not frankly enlarged. There are slightly prominent left periaortic chain nodes up to 1 cm in short axis. Large heterogeneous left adnexal/pelvic mass again measures 5.4 x 4.8 cm on 11:72. This is about double the size it was on an earlier study this year on 12/18/2022. Reproductive: The uterus is intact. No new adnexal abnormality. 2 cm transmural fibroid of the right side of the fundus. Other: Small volume of pelvic ascites. No free hemorrhage, free air or localizing collections. Musculoskeletal: Diffuse osteoblastic metastatic disease, also has  progressed from 02/03/2023. No visible pathologic regional fracture. Review of the MIP images confirms the above findings. IMPRESSION: 1. No evidence of arterial embolus or acute chest process. 2. Small pericardial effusion, new from 07/06/2023. 3. Cardiomegaly without evidence of CHF. 4. Unchanged 6 mm and 5 mm left upper lobe nodules. 5. Bilateral axillary adenopathy, left greater than right, with borderline to slightly prominent mediastinal, retroperitoneal and pelvic lymph nodes. Positive PET activity on recent PET-CT. 6. 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 7. Increasing thickened folds in the distal descending and proximal/mid sigmoid colon with adjacent stranding consistent with colitis or diverticulitis. No wall pneumatosis, free air, or abscess. 8. 5.4 x 4.8 cm heterogeneous left adnexal/pelvic mass almost certainly metastatic, about double the size it was on an earlier study this year on 12/18/2022. 9. Small volume of pelvic ascites.  No free air. 10. Cholelithiasis without evidence of cholecystitis. 11. Mild hepatic steatosis. Electronically Signed   By: Almira Bar M.D.   On: 07/22/2023 04:40   CT ABDOMEN PELVIS W CONTRAST  Result Date: 07/22/2023 CLINICAL DATA:  Weakness, emesis and diarrhea for the last several days. Currently taking chemotherapy and radiation for metastatic adrenal carcinoma. EXAM: CT ANGIOGRAPHY CHEST CT ABDOMEN AND PELVIS WITH CONTRAST TECHNIQUE: Multidetector CT imaging of the chest was performed using the standard protocol during bolus administration of intravenous contrast. Multiplanar CT image reconstructions and MIPs were obtained to evaluate the vascular anatomy. Multidetector CT imaging of the abdomen and pelvis was performed using the standard protocol during bolus administration of intravenous contrast. RADIATION DOSE REDUCTION: This exam was performed according to the departmental dose-optimization program which includes automated  exposure control, adjustment of the mA and/or kV according to patient size and/or use of iterative reconstruction technique. CONTRAST:  OMNIPAQUE IOHEXOL 350 MG/ML SOLN COMPARISON:  Portable chest today, PET-CT 07/06/2023, chest, abdomen and pelvis outside CT images only dated 02/03/2023. FINDINGS: CTA CHEST FINDINGS Cardiovascular: Pulmonary arteries are normal in caliber and do not show embolic filling defects. There is minimal atherosclerosis in the aortic arch. There is no aneurysm, stenosis or dissection. The great vessels are clear.  The pulmonary veins are nondistended. There is mild cardiomegaly with a left chamber predominance. There is a right chest port with IJ approach catheter with tip in the right atrium. There is a small pericardial effusion new from 07/06/2023. No visible coronary calcific plaques. Mediastinum/Nodes: Lower paratracheal space node measuring 8 mm  in short axis on 4:45 and adjacent right-sided precarinal lymph node also 8 mm short axis. Subcarinal lymph node up to 9 mm short axis. All of these with uptake on PET-CT but no bulky adenopathy. No hilar adenopathy. In the left axilla multiple enlarged lymph nodes again are noted, with multifocal PET activity. Largest of these is 1.4 x 3.3 cm on 4:17, 2 x 1.5 cm on 4:20 and 2 x 1.5 cm on 4:31. There has been no change since 07/06/2023. There is an enlarged right axillary node measuring 1.3 x 1.8 cm on 4:34. Right cardiophrenic angle lymph nodes largest of these is 9 mm in short axis on 4:95. Lungs/Pleura: Mildly elevated right hemidiaphragm. Unchanged 6 mm pleural-based posterior left upper lobe nodule on 5:36. Unchanged 5 mm pleural-based nodule anteriorly in the left upper lobe on 5:49. There are no other appreciable nodules. There is no consolidation, effusion or pneumothorax. 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 There are scattered thoracic spine endplate Schmorl's nodes but no acute  compression fracture. Findings are more confluent and widespread than on 02/03/2023. Review of the MIP images confirms the above findings. CT ABDOMEN and PELVIS FINDINGS Hepatobiliary: No hepatic mass enhancement. Mild hepatic steatosis. Cholelithiasis without evidence of cholecystitis or biliary dilatation. Pancreas: No abnormality. Spleen: No abnormality. 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 Both kidneys enhance homogeneously. There is no urinary stone or obstruction. No bladder thickening. Stomach/Bowel: There is fluid in the colon in the ascending and transverse segments. There are increasing thickened folds in the distal descending and proximal/mid sigmoid colon and adjacent stranding consistent with colitis or diverticulitis. There is no wall pneumatosis, free air, or abscess. Vascular/Lymphatic: Small hypermetabolic lymph nodes were noted on the recent PET-CT and have not changed. Examples include an aortocaval lymph node 7 mm short axis on 11: 39, left external iliac chain node is 9 mm short axis on 11:77, with scattered subcentimeter hypermetabolic inguinal lymph nodes also recently seen. By strict size criteria, majority of the hypermetabolic nodes are not frankly enlarged. There are slightly prominent left periaortic chain nodes up to 1 cm in short axis. Large heterogeneous left adnexal/pelvic mass again measures 5.4 x 4.8 cm on 11:72. This is about double the size it was on an earlier study this year on 12/18/2022. Reproductive: The uterus is intact. No new adnexal abnormality. 2 cm transmural fibroid of the right side of the fundus. Other: Small volume of pelvic ascites. No free hemorrhage, free air or localizing collections. 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 No visible pathologic regional fracture. Review of the MIP images confirms the above findings. IMPRESSION: 1. No evidence of  arterial embolus or acute chest process. 2. Small pericardial effusion, new from 07/06/2023. 3. Cardiomegaly without evidence of CHF. 4. Unchanged 6 mm and 5 mm left upper lobe nodules. 5. Bilateral axillary adenopathy, left greater than right, with borderline to slightly prominent mediastinal, retroperitoneal and pelvic lymph nodes. Positive PET activity on recent PET-CT. 6. 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 7. Increasing thickened folds in the distal descending and proximal/mid sigmoid colon with adjacent stranding consistent with colitis or diverticulitis. No wall pneumatosis, free air, or abscess. 8. 5.4 x 4.8 cm heterogeneous left adnexal/pelvic mass almost certainly metastatic, about double the size it was on an earlier study this year on 12/18/2022. 9. Small volume of pelvic ascites.  No free air. 10. Cholelithiasis without evidence of cholecystitis. 11. Mild hepatic  steatosis. Electronically Signed   By: Almira Bar M.D.   On: 07/22/2023 04:40   DG Chest Port 1 View  Result Date: 07/22/2023 CLINICAL DATA:  Generalized weakness for several days, history of adrenal carcinoma EXAM: PORTABLE CHEST 1 VIEW COMPARISON:  PET-CT from 07/06/2019 FINDINGS: Cardiac shadow is within normal limits. Lungs are well aerated bilaterally. Right chest wall port is noted. Bony structures demonstrate increased sclerosis consistent with metastatic disease. IMPRESSION: Changes consistent with bony metastatic disease. These are stable from prior PET-CT. No acute abnormality noted. Electronically Signed   By: Alcide Clever M.D.   On: 07/22/2023 02:50    ONCOLOGY: Patient's workup was initiated at [facility] in February 2024 at which point PET scan revealed widely metastatic disease with innumerable sclerotic bony lesions as well as axillary, mediastinal, and abdominal lymphadenopathy.  Subsequent biopsy of a right axillary lymph node revealed signet ring adenocarcinoma, likely GI  origin.  Additional testing revealed high tumor mutational burden.  Further workup at [facility] in April 2024 revealed a negative EGD, and negative CT scanning of the brain.  CT scan of the abdomen and follow-up pelvic ultrasound revealed a large left adnexal mass that was not commented on PET scan from February.  CA 19-9 is normal, but CA125 and CEA are mildly elevated.    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  Repeat PET scan on February 24, 2023 reviewed independently confirming widespread metastatic disease.  Left adnexal mass is now PET positive of unclear clinical significance and after evaluation by gyn-onc, ultimately would not change the overall treatment plan.  Repeat PET scan on July 06, 2023 reviewed independently with stable to mildly progressive disease, but not significant enough to alter the treatment plan.  Patient is receiving FOLFOX plus Keytruda every 3 weeks for 12 cycles followed by maintenance Keytruda for at least 2 years.  Patient also received Zometa on even number cycles.  Patient has not received chemotherapy since July 18, 2023.  She wishes once again to delay cycle 8 of treatment today.  Return to clinic in 2 weeks for further evaluation and reconsideration of treatment.  Will reimage with PET scan in December 2024.   Adnexal mass: Repeat PET scan as above.  Awaiting pathology from outside biopsy for further evaluation.  Appreciate gyn-onc input. Pain: Chronic and unchanged.  Continue follow-up with her physician in the pain clinic in [place], [place].  Patient now completed XRT.   Bony disease: Continue Zometa on even number cycles as above. Hyponatremia: Patient's sodium level has decreased slightly to 130, monitor. Hypokalemia: Mild, monitor. Transaminitis: Resolved.  Neither oxaliplatin nor 5-FU need to  be dose reduced. Anemia: Hemoglobin improved to 9.1 with transfusion.  Monitor.   Neutropenia: Resolved.  Continue Fulphilia with pump removal. Thrombocytopenia: Platelet count slowly improving and is now 112.  Delay treatment as above. Weight loss: Continue Megace as prescribed. Hypertension: Well-controlled, monitor.   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 Patient is sexually active, menstruating, and not on birth control however. She verbalizes consent to proceed with treatment after previous discussion of potential life threatening or damaging effects to embryo. Megace not likely to cause ovarian suppression.  Patient also previously declined pelvic ultrasound to evaluate for pregnancy.  Patient previously discussed with nurse practitioner birth control including abstinence, condoms, OCPs, Depo-Lupron, vs IUD. Discussed pros and cons  of each. She will think about these and let us know how she would like to proceed.  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  Continue weekly labs.  Patient has been instructed to keep her follow-up appointment with ID next week.   Coping/anxiety: Patient was previously given a referral to [facility].  Patient expressed understanding and was in agreement with this plan. She also understands that She can call clinic at any time with any questions, concerns, or complaints.    Cancer Staging  Signet ring cell adenocarcinoma [facility]) Staging form: Exocrine Pancreas, AJCC 8th Edition - Clinical stage from 02/07/2023: Stage IV (cTX, cNX, pM1) - Signed by Jeralyn Ruths, MD on 02/14/2023 Stage prefix: Initial diagnosis   Jeralyn Ruths, MD   08/15/2023 11:20 AM

## 2023-08-16 ENCOUNTER — Other Ambulatory Visit: Payer: Self-pay

## 2023-08-16 ENCOUNTER — Inpatient Hospital Stay (HOSPITAL_BASED_OUTPATIENT_CLINIC_OR_DEPARTMENT_OTHER): Payer: 59 | Admitting: Hospice and Palliative Medicine

## 2023-08-16 ENCOUNTER — Telehealth: Payer: Self-pay | Admitting: *Deleted

## 2023-08-16 DIAGNOSIS — Z515 Encounter for palliative care: Secondary | ICD-10-CM | POA: Diagnosis not present

## 2023-08-16 DIAGNOSIS — C801 Malignant (primary) neoplasm, unspecified: Secondary | ICD-10-CM

## 2023-08-16 DIAGNOSIS — C7989 Secondary malignant neoplasm of other specified sites: Secondary | ICD-10-CM | POA: Diagnosis not present

## 2023-08-16 LAB — BETA HCG QUANT (REF LAB): hCG Quant: 57 m[IU]/mL

## 2023-08-16 LAB — CMV DNA, QUANTITATIVE, PCR
CMV DNA Quant: NEGATIVE [IU]/mL
Log10 CMV Qn DNA Pl: UNDETERMINED {Log}

## 2023-08-16 MED ORDER — DEXAMETHASONE 2 MG PO TABS: 2.0000 mg | ORAL_TABLET | Freq: Every day | ORAL | 0 refills | Status: DC

## 2023-08-16 NOTE — Telephone Encounter (Signed)
Spoke w/patient. Ok to chg apt on 10/21 to now to speak to [facility] - NP. Re: pain mgmt

## 2023-08-16 NOTE — Progress Notes (Signed)
Virtual Visit via Telephone Note  I connected with Sheryl Silva on 08/16/23 at 10:30 AM EDT by telephone and verified that I am speaking with the correct person using two identifiers.  Location: Patient: Home Provider: Clinic   I discussed the limitations, risks, security and privacy concerns of performing an evaluation and management service by telephone and the availability of in person appointments. I also discussed with the patient that there may be a patient responsible charge related to this service. The patient expressed understanding and agreed to proceed.   History of Present Illness: metastatic signet ring adenocarcinoma of likely GI origin. Patient has been on regimen of FOLFOX plus Keytruda. She was admitted to the hospital 07/22/2023 with abdominal pain. CT of the abdomen and pelvis showed progressive mediastinal, retroperitoneal, and pelvic lymphadenopathy, diffuse osteoblastic metastatic disease, and left adnexal pelvic mass. Patient also with findings of colitis. Palliative care was consulted to address goals and manage ongoing symptoms.    Observations/Objective: Patient was an add-on telephone visit today to discuss pain management.  Patient endorses poorly controlled abdominal pain, unchanged in characteristic from when she was hospitalized.  Denies nausea or vomiting.  Having regular bowel movements.  Says that she has taken her tramadol but has had infrequent dosing of Percocet.  She did take a Percocet about 20 minutes ago and feels like the pain is easing off some.  She requests steroids as those helped her feel better overall while she was hospitalized.  Assessment and Plan: Stage IV signet ring adenocarcinoma -patient saw Dr. Orlie Dakin yesterday with plan to continue FOLFOX plus Keytruda regimen.  Neoplasm related pain -I encouraged patient to maximize use of Percocet in addition to her tramadol ER.  Patient requests steroids.  Discussed with Dr. Orlie Dakin and will  prescribe 3-day course of low-dose dexamethasone.  However, discussed frankly with patient the risks of long-term steroids and patient verbalized understanding that steroids can reduce efficacy of her cancer treatment.  Of note, patient is established with pain clinic.  Follow Up Instructions: Follow-up telephone visit 1 month   I discussed the assessment and treatment plan with the patient. The patient was provided an opportunity to ask questions and all were answered. The patient agreed with the plan and demonstrated an understanding of the instructions.   The patient was advised to call back or seek an in-person evaluation if the symptoms worsen or if the condition fails to improve as anticipated.  I provided 10 minutes of non-face-to-face time during this encounter.   Malachy Moan, NP

## 2023-08-16 NOTE — Telephone Encounter (Signed)
Patient called reporting that she is having a continuous gnawing sore achy lower abdominal and low back pain. She took Oxycodone for it and it did not help it at all. She said she mentioned her abdominal pain to Dr Orlie Dakin yesterday, but it got worse over night. She is asking for something for the pain. She denies constipation or diarrhea but does stat that her bowel movement, this morning was looser than usual. Please advise

## 2023-08-17 ENCOUNTER — Inpatient Hospital Stay: Payer: 59

## 2023-08-18 ENCOUNTER — Inpatient Hospital Stay: Payer: 59

## 2023-08-18 ENCOUNTER — Ambulatory Visit: Payer: 59 | Admitting: Radiation Oncology

## 2023-08-18 NOTE — Progress Notes (Signed)
Nutrition  Patient called to cancel appointment for today.  She will call back to reschedule.    Brees Hounshell B. Freida Busman, RD, LDN Registered Dietitian [phone]

## 2023-08-20 ENCOUNTER — Emergency Department: Payer: 59

## 2023-08-20 ENCOUNTER — Other Ambulatory Visit: Payer: Self-pay

## 2023-08-20 ENCOUNTER — Inpatient Hospital Stay
Admission: EM | Admit: 2023-08-20 | Discharge: 2023-08-23 | DRG: 444 | Disposition: A | Discharge status: Home / Self Care | Payer: 59 | Attending: Internal Medicine | Admitting: Internal Medicine

## 2023-08-20 DIAGNOSIS — C799 Secondary malignant neoplasm of unspecified site: Secondary | ICD-10-CM | POA: Diagnosis present

## 2023-08-20 DIAGNOSIS — I2510 Atherosclerotic heart disease of native coronary artery without angina pectoris: Secondary | ICD-10-CM | POA: Diagnosis present

## 2023-08-20 DIAGNOSIS — E222 Syndrome of inappropriate secretion of antidiuretic hormone: Secondary | ICD-10-CM | POA: Diagnosis present

## 2023-08-20 DIAGNOSIS — E785 Hyperlipidemia, unspecified: Secondary | ICD-10-CM | POA: Diagnosis present

## 2023-08-20 DIAGNOSIS — Z87891 Personal history of nicotine dependence: Secondary | ICD-10-CM

## 2023-08-20 DIAGNOSIS — C269 Malignant neoplasm of ill-defined sites within the digestive system: Secondary | ICD-10-CM | POA: Diagnosis present

## 2023-08-20 DIAGNOSIS — K810 Acute cholecystitis: Principal | ICD-10-CM | POA: Insufficient documentation

## 2023-08-20 DIAGNOSIS — I10 Essential (primary) hypertension: Secondary | ICD-10-CM | POA: Diagnosis present

## 2023-08-20 DIAGNOSIS — A0839 Other viral enteritis: Secondary | ICD-10-CM | POA: Diagnosis present

## 2023-08-20 DIAGNOSIS — K219 Gastro-esophageal reflux disease without esophagitis: Secondary | ICD-10-CM | POA: Diagnosis present

## 2023-08-20 DIAGNOSIS — Z5986 Financial insecurity: Secondary | ICD-10-CM

## 2023-08-20 DIAGNOSIS — K819 Cholecystitis, unspecified: Secondary | ICD-10-CM | POA: Diagnosis present

## 2023-08-20 DIAGNOSIS — Z79899 Other long term (current) drug therapy: Secondary | ICD-10-CM

## 2023-08-20 DIAGNOSIS — Z1152 Encounter for screening for COVID-19: Secondary | ICD-10-CM

## 2023-08-20 DIAGNOSIS — J189 Pneumonia, unspecified organism: Principal | ICD-10-CM | POA: Insufficient documentation

## 2023-08-20 DIAGNOSIS — B258 Other cytomegaloviral diseases: Secondary | ICD-10-CM | POA: Diagnosis present

## 2023-08-20 DIAGNOSIS — J690 Pneumonitis due to inhalation of food and vomit: Secondary | ICD-10-CM | POA: Diagnosis present

## 2023-08-20 DIAGNOSIS — Z888 Allergy status to other drugs, medicaments and biological substances status: Secondary | ICD-10-CM

## 2023-08-20 DIAGNOSIS — Z5982 Transportation insecurity: Secondary | ICD-10-CM

## 2023-08-20 LAB — SARS CORONAVIRUS 2 BY RT PCR: SARS Coronavirus 2 by RT PCR: NEGATIVE

## 2023-08-20 LAB — HEPATIC FUNCTION PANEL
ALT: 43 U/L (ref 0–44)
AST: 69 U/L — ABNORMAL HIGH (ref 15–41)
Albumin: 2.7 g/dL — ABNORMAL LOW (ref 3.5–5.0)
Alkaline Phosphatase: 332 U/L — ABNORMAL HIGH (ref 38–126)
Bilirubin, Direct: 0.1 mg/dL (ref 0.0–0.2)
Indirect Bilirubin: 0.6 mg/dL (ref 0.3–0.9)
Total Bilirubin: 0.7 mg/dL (ref 0.3–1.2)
Total Protein: 6.5 g/dL (ref 6.5–8.1)

## 2023-08-20 LAB — BASIC METABOLIC PANEL
Anion gap: 11 (ref 5–15)
BUN: 13 mg/dL (ref 6–20)
CO2: 23 mmol/L (ref 22–32)
Calcium: 8.3 mg/dL — ABNORMAL LOW (ref 8.9–10.3)
Chloride: 97 mmol/L — ABNORMAL LOW (ref 98–111)
Creatinine, Ser: 0.65 mg/dL (ref 0.44–1.00)
GFR, Estimated: >60 mL/min (ref >60)
Glucose, Bld: 107 mg/dL — ABNORMAL HIGH (ref 70–99)
Potassium: 3.8 mmol/L (ref 3.5–5.1)
Sodium: 131 mmol/L — ABNORMAL LOW (ref 135–145)

## 2023-08-20 LAB — TSH: TSH: 0.576 u[IU]/mL (ref 0.350–4.500)

## 2023-08-20 LAB — MAGNESIUM: Magnesium: 1.6 mg/dL — ABNORMAL LOW (ref 1.7–2.4)

## 2023-08-20 LAB — CBC
HCT: 28.9 % — ABNORMAL LOW (ref 36.0–46.0)
Hemoglobin: 9.4 g/dL — ABNORMAL LOW (ref 12.0–15.0)
MCH: 31.2 pg (ref 26.0–34.0)
MCHC: 32.5 g/dL (ref 30.0–36.0)
MCV: 96 fL (ref 80.0–100.0)
Platelets: 181 10*3/uL (ref 150–400)
RBC: 3.01 MIL/uL — ABNORMAL LOW (ref 3.87–5.11)
RDW: 16.5 % — ABNORMAL HIGH (ref 11.5–15.5)
WBC: 4.5 10*3/uL (ref 4.0–10.5)
nRBC: 0.4 % — ABNORMAL HIGH (ref 0.0–0.2)

## 2023-08-20 LAB — TROPONIN I (HIGH SENSITIVITY)
Troponin I (High Sensitivity): 34 ng/L — ABNORMAL HIGH (ref <18)
Troponin I (High Sensitivity): 28 ng/L — ABNORMAL HIGH (ref <18)

## 2023-08-20 LAB — LIPASE, BLOOD: Lipase: 19 U/L (ref 11–51)

## 2023-08-20 LAB — HCG, QUANTITATIVE, PREGNANCY: hCG, Beta Chain, Quant, S: 110 m[IU]/mL — ABNORMAL HIGH (ref <5)

## 2023-08-20 LAB — PHOSPHORUS: Phosphorus: 4.2 mg/dL (ref 2.5–4.6)

## 2023-08-20 LAB — CK: Total CK: 53 U/L (ref 38–234)

## 2023-08-20 MED ORDER — VANCOMYCIN HCL IN DEXTROSE 1-5 GM/200ML-% IV SOLN: 1000.0000 mg | Freq: Once | INTRAVENOUS | Status: DC

## 2023-08-20 MED ORDER — ONDANSETRON HCL 4 MG/2ML IJ SOLN
4.0000 mg | Freq: Once | INTRAMUSCULAR | Status: AC
Start: 2023-08-20 — End: 2023-08-20
  Administered 2023-08-20: 4 mg via INTRAVENOUS
  Filled 2023-08-20: qty 2

## 2023-08-20 MED ORDER — LORAZEPAM 1 MG PO TABS
1.0000 mg | ORAL_TABLET | Freq: Once | ORAL | Status: AC
  Administered 2023-08-20: 1 mg via ORAL
  Filled 2023-08-20: qty 1

## 2023-08-20 MED ORDER — KETOROLAC TROMETHAMINE 15 MG/ML IJ SOLN
15.0000 mg | Freq: Once | INTRAMUSCULAR | Status: AC
  Administered 2023-08-20: 15 mg via INTRAVENOUS
  Filled 2023-08-20: qty 1

## 2023-08-20 MED ORDER — SODIUM CHLORIDE 0.9 % IV BOLUS
1000.0000 mL | Freq: Once | INTRAVENOUS | Status: AC
  Administered 2023-08-20: 1000 mL via INTRAVENOUS

## 2023-08-20 MED ORDER — MORPHINE SULFATE (PF) 4 MG/ML IV SOLN
4.0000 mg | Freq: Once | INTRAVENOUS | Status: AC
Start: 2023-08-20 — End: 2023-08-20
  Administered 2023-08-20: 4 mg via INTRAVENOUS
  Filled 2023-08-20: qty 1

## 2023-08-20 MED ORDER — IOHEXOL 350 MG/ML SOLN
100.0000 mL | Freq: Once | INTRAVENOUS | Status: AC | PRN
Start: 2023-08-20 — End: 2023-08-20
  Administered 2023-08-20: 100 mL via INTRAVENOUS

## 2023-08-20 MED ORDER — SODIUM CHLORIDE 0.9 % IV SOLN
2.0000 g | Freq: Once | INTRAVENOUS | Status: AC
  Administered 2023-08-21: 2 g via INTRAVENOUS
  Filled 2023-08-20: qty 12.5

## 2023-08-20 MED ORDER — VANCOMYCIN HCL 1500 MG/300ML IV SOLN
1500.0000 mg | Freq: Once | INTRAVENOUS | Status: AC
  Administered 2023-08-21: 1500 mg via INTRAVENOUS
  Filled 2023-08-20: qty 300

## 2023-08-20 NOTE — ED Notes (Signed)
Pt transported to CT ?

## 2023-08-20 NOTE — Consult Note (Signed)
PHARMACY -  BRIEF ANTIBIOTIC NOTE   Pharmacy has received consult(s) for Vancomycin and Cefepime from an ED provider.  The patient's profile has been reviewed for ht/wt/allergies/indication/available labs.    One time order(s) placed for Vancomycin 1500mg  IV and Cefepime 2g IV.  Further antibiotics/pharmacy consults should be ordered by admitting physician if indicated.                       Thank you, Bettey Costa 08/20/2023  11:34 PM

## 2023-08-20 NOTE — ED Triage Notes (Signed)
Pt in via EMS from home with c/o CP  EMS reports pt is current cancer patient and has pain to her abdomen, back and chest pain for 3 days and this am started with urinary retention. Pt was recently discharged from the hospital with UC where she was admitted for 14 days. 115 HR, 149/90, Has a PICC line to her right upper chest.  Pt was given 324mg  of asa en route by EMS

## 2023-08-20 NOTE — ED Triage Notes (Signed)
Pt to ED for chest pain for a couple days. +shob.  Last chemo 4 weeks ago

## 2023-08-20 NOTE — ED Provider Notes (Signed)
[facility] Provider Note    Event Date/Time   First MD Initiated Contact with Patient 08/20/23 1545     (approximate)   History   Chief Complaint: Chest Pain   HPI  Sheryl Silva is a 47 y.o. female with a history of hypertension, metastatic cancer who comes ED complaining of shortness of breath and chest pain for the past 2 days.  Chest pain feels sharp, not pleuritic.  Nonradiating associated with bilateral frontal headache, body aches, fatigue, generalized abdominal discomfort, malaise and nausea.  No black or bloody stool or vomiting.  Reviewed outside records noting she was recently hospitalized 07/22/2023 for colitis and diverticulitis.  Given antibiotics.  At that time beta-hCG was 100, she had a pelvic ultrasound which showed no signs of pregnancy but did show a metastatic mass on her ovary as the likely source of the elevated hCG level.  She then proceeded with CT of the chest abdomen and pelvis at that time.  Denies possibility of pregnancy.     Physical Exam   Triage Vital Signs: ED Triage Vitals  Encounter Vitals Group     BP 08/20/23 1412 117/81     Systolic BP Percentile --      Diastolic BP Percentile --      Pulse Rate 08/20/23 1412 (!) 124     Resp 08/20/23 1412 18     Temp 08/20/23 1412 98 F (36.7 C)     Temp src --      SpO2 08/20/23 1412 99 %     Weight 08/20/23 1413 160 lb 15 oz (73 kg)     Height 08/20/23 1413 5\' 6"  (1.676 m)     Head Circumference --      Peak Flow --      Pain Score 08/20/23 1413 10     Pain Loc --      Pain Education --      Exclude from Growth Chart --     Most recent vital signs: Vitals:   08/20/23 2130 08/20/23 2300  BP: (!) 142/97 (!) 138/91  Pulse: (!) 121   Resp: (!) 23 20  Temp:    SpO2: 99%     General: Awake, no distress.  CV:  Good peripheral perfusion.  Tachycardia heart rate 120 Resp:  Normal effort.  Clear to auscultation bilaterally without crackles or wheezing Abd:  No  distention.  Soft with generalized tenderness Other:  Dry oral mucosa.  No rash, no edema   ED Results / Procedures / Treatments   Labs (all labs ordered are listed, but only abnormal results are displayed) Labs Reviewed  BASIC METABOLIC PANEL - Abnormal; Notable for the following components:      Result Value   Sodium 131 (*)    Chloride 97 (*)    Glucose, Bld 107 (*)    Calcium 8.3 (*)    All other components within normal limits  CBC - Abnormal; Notable for the following components:   RBC 3.01 (*)    Hemoglobin 9.4 (*)    HCT 28.9 (*)    RDW 16.5 (*)    nRBC 0.4 (*)    All other components within normal limits  MAGNESIUM - Abnormal; Notable for the following components:   Magnesium 1.6 (*)    All other components within normal limits  HEPATIC FUNCTION PANEL - Abnormal; Notable for the following components:   Albumin 2.7 (*)    AST 69 (*)    Alkaline Phosphatase  332 (*)    All other components within normal limits  HCG, QUANTITATIVE, PREGNANCY - Abnormal; Notable for the following components:   hCG, Beta Chain, Quant, S 110 (*)    All other components within normal limits  TROPONIN I (HIGH SENSITIVITY) - Abnormal; Notable for the following components:   Troponin I (High Sensitivity) 34 (*)    All other components within normal limits  TROPONIN I (HIGH SENSITIVITY) - Abnormal; Notable for the following components:   Troponin I (High Sensitivity) 28 (*)    All other components within normal limits  SARS CORONAVIRUS 2 BY RT PCR  CULTURE, BLOOD (ROUTINE X 2)  CULTURE, BLOOD (ROUTINE X 2)  PHOSPHORUS  TSH  LIPASE, BLOOD  CK  LACTIC ACID, PLASMA  LACTIC ACID, PLASMA  POC URINE PREG, ED     EKG Interpreted by me Sinus tachycardia rate 116.  Normal axis and intervals.  Normal QRS ST segments and T waves.   RADIOLOGY Chest x-ray interpreted by me, airspace appears normal.  Radiology report reviewed   PROCEDURES:  .Critical Care  Performed by: Sharman Cheek, MD Authorized by: Sharman Cheek, MD   Critical care provider statement:    Critical care time (minutes):  35   Critical care time was exclusive of:  Separately billable procedures and treating other patients   Critical care was necessary to treat or prevent imminent or life-threatening deterioration of the following conditions:  Sepsis   Critical care was time spent personally by me on the following activities:  Development of treatment plan with patient or surrogate, discussions with consultants, evaluation of patient's response to treatment, examination of patient, obtaining history from patient or surrogate, ordering and performing treatments and interventions, ordering and review of laboratory studies, ordering and review of radiographic studies, pulse oximetry, re-evaluation of patient's condition and review of old charts    MEDICATIONS ORDERED IN ED: Medications  ceFEPIme (MAXIPIME) 2 g in sodium chloride 0.9 % 100 mL IVPB (has no administration in time range)  vancomycin (VANCOREADY) IVPB 1500 mg/300 mL (has no administration in time range)  HYDROmorphone (DILAUDID) injection 1 mg (has no administration in time range)  sodium chloride 0.9 % bolus 1,000 mL (0 mLs Intravenous Stopped 08/20/23 1934)  ketorolac (TORADOL) 15 MG/ML injection 15 mg (15 mg Intravenous Given 08/20/23 1637)  ondansetron (ZOFRAN) injection 4 mg (4 mg Intravenous Given 08/20/23 1639)  LORazepam (ATIVAN) tablet 1 mg (1 mg Oral Given 08/20/23 1801)  iohexol (OMNIPAQUE) 350 MG/ML injection 100 mL (100 mLs Intravenous Contrast Given 08/20/23 1922)  morphine (PF) 4 MG/ML injection 4 mg (4 mg Intravenous Given 08/20/23 1955)     IMPRESSION / MDM / ASSESSMENT AND PLAN / ED COURSE  I reviewed the triage vital signs and the nursing notes.  DDx: Pneumonia, pneumothorax, pleural effusion, pulmonary edema, pulmonary embolism, viral illness, COVID, electrolyte abnormality, dehydration, AKI, hyperthyroidism,  intra-abdominal abscess, bowel perforation  Patient's presentation is most consistent with acute presentation with potential threat to life or bodily function.  Patient presents with multiple constitutional symptoms suggestive of viral illness.  She is having shortness of breath and sharp chest pain and tachycardia, with cancer history and recent hospitalization will need CT angiogram of the chest to rule out PE.  Will repeat CT scan to look for complications of diverticulitis such as bowel perforation or intra-abdominal abscess.  We have IV fluids, Toradol, Zofran for symptom relief.  ----------------------------------------- 12:08 AM on 08/21/2023 ----------------------------------------- CT neg. For PE but reveals multifocal pna.  Also suggestive of biliary disease and L iliac thrombosis - [place] ordered for both. Will need to admit for pna and sepsis management. Hcap abx ordered.        FINAL CLINICAL IMPRESSION(S) / ED DIAGNOSES   Final diagnoses:  Multifocal pneumonia  Metastatic malignant neoplasm, unspecified site [facility])     Rx / DC Orders   ED Discharge Orders     None        Note:  This document was prepared using Dragon voice recognition software and may include unintentional dictation errors.   Sharman Cheek, MD 08/21/23 [phone]

## 2023-08-20 NOTE — ED Notes (Signed)
US at bedside

## 2023-08-21 ENCOUNTER — Emergency Department: Payer: 59

## 2023-08-21 ENCOUNTER — Encounter: Payer: Self-pay | Admitting: Radiology

## 2023-08-21 DIAGNOSIS — E785 Hyperlipidemia, unspecified: Secondary | ICD-10-CM | POA: Diagnosis present

## 2023-08-21 DIAGNOSIS — C799 Secondary malignant neoplasm of unspecified site: Secondary | ICD-10-CM | POA: Diagnosis present

## 2023-08-21 DIAGNOSIS — J690 Pneumonitis due to inhalation of food and vomit: Secondary | ICD-10-CM | POA: Diagnosis present

## 2023-08-21 DIAGNOSIS — Z79899 Other long term (current) drug therapy: Secondary | ICD-10-CM | POA: Diagnosis not present

## 2023-08-21 DIAGNOSIS — I2510 Atherosclerotic heart disease of native coronary artery without angina pectoris: Secondary | ICD-10-CM | POA: Diagnosis present

## 2023-08-21 DIAGNOSIS — J189 Pneumonia, unspecified organism: Secondary | ICD-10-CM | POA: Diagnosis present

## 2023-08-21 DIAGNOSIS — Z888 Allergy status to other drugs, medicaments and biological substances status: Secondary | ICD-10-CM | POA: Diagnosis not present

## 2023-08-21 DIAGNOSIS — K810 Acute cholecystitis: Secondary | ICD-10-CM | POA: Diagnosis present

## 2023-08-21 DIAGNOSIS — K819 Cholecystitis, unspecified: Secondary | ICD-10-CM | POA: Diagnosis present

## 2023-08-21 DIAGNOSIS — Z87891 Personal history of nicotine dependence: Secondary | ICD-10-CM | POA: Diagnosis not present

## 2023-08-21 DIAGNOSIS — E222 Syndrome of inappropriate secretion of antidiuretic hormone: Secondary | ICD-10-CM | POA: Diagnosis present

## 2023-08-21 DIAGNOSIS — K219 Gastro-esophageal reflux disease without esophagitis: Secondary | ICD-10-CM | POA: Diagnosis present

## 2023-08-21 DIAGNOSIS — Z5986 Financial insecurity: Secondary | ICD-10-CM | POA: Diagnosis not present

## 2023-08-21 DIAGNOSIS — I10 Essential (primary) hypertension: Secondary | ICD-10-CM | POA: Diagnosis present

## 2023-08-21 DIAGNOSIS — A0839 Other viral enteritis: Secondary | ICD-10-CM | POA: Diagnosis present

## 2023-08-21 DIAGNOSIS — Z5982 Transportation insecurity: Secondary | ICD-10-CM | POA: Diagnosis not present

## 2023-08-21 DIAGNOSIS — C269 Malignant neoplasm of ill-defined sites within the digestive system: Secondary | ICD-10-CM | POA: Diagnosis present

## 2023-08-21 DIAGNOSIS — Z1152 Encounter for screening for COVID-19: Secondary | ICD-10-CM | POA: Diagnosis not present

## 2023-08-21 DIAGNOSIS — B258 Other cytomegaloviral diseases: Secondary | ICD-10-CM | POA: Diagnosis present

## 2023-08-21 LAB — LACTIC ACID, PLASMA
Lactic Acid, Venous: 0.7 mmol/L (ref 0.5–1.9)
Lactic Acid, Venous: 0.7 mmol/L (ref 0.5–1.9)

## 2023-08-21 LAB — POC URINE PREG, ED: Preg Test, Ur: POSITIVE — AB

## 2023-08-21 LAB — LIPASE, BLOOD: Lipase: 18 U/L (ref 11–51)

## 2023-08-21 MED ORDER — MEGESTROL ACETATE 20 MG PO TABS
40.0000 mg | ORAL_TABLET | Freq: Every day | ORAL | Status: DC
  Administered 2023-08-21 – 2023-08-23 (×3): 40 mg via ORAL
  Filled 2023-08-21 (×4): qty 2

## 2023-08-21 MED ORDER — PROCHLORPERAZINE MALEATE 10 MG PO TABS: 10.0000 mg | ORAL_TABLET | Freq: Four times a day (QID) | ORAL | Status: DC | PRN

## 2023-08-21 MED ORDER — BUSPIRONE HCL 5 MG PO TABS: 5.0000 mg | ORAL_TABLET | Freq: Two times a day (BID) | ORAL | Status: DC

## 2023-08-21 MED ORDER — POLYETHYLENE GLYCOL 3350 17 G PO PACK: 17.0000 g | PACK | Freq: Every day | ORAL | Status: DC | PRN

## 2023-08-21 MED ORDER — ENSURE ENLIVE PO LIQD
237.0000 mL | Freq: Three times a day (TID) | ORAL | Status: DC
  Administered 2023-08-22: 237 mL via ORAL

## 2023-08-21 MED ORDER — CYCLOBENZAPRINE HCL 5 MG PO TABS: 7.5000 mg | ORAL_TABLET | Freq: Three times a day (TID) | ORAL | Status: DC | PRN

## 2023-08-21 MED ORDER — GABAPENTIN 300 MG PO CAPS
300.0000 mg | ORAL_CAPSULE | Freq: Three times a day (TID) | ORAL | Status: DC
  Administered 2023-08-21 – 2023-08-23 (×6): 300 mg via ORAL
  Filled 2023-08-21 (×6): qty 1

## 2023-08-21 MED ORDER — OXYCODONE-ACETAMINOPHEN 5-325 MG PO TABS
1.0000 | ORAL_TABLET | ORAL | Status: DC | PRN
  Administered 2023-08-21 – 2023-08-23 (×11): 1 via ORAL
  Filled 2023-08-21 (×11): qty 1

## 2023-08-21 MED ORDER — HYDROMORPHONE HCL 1 MG/ML IJ SOLN
2.0000 mg | Freq: Once | INTRAMUSCULAR | Status: AC
  Administered 2023-08-21: 2 mg via INTRAVENOUS
  Filled 2023-08-21: qty 2

## 2023-08-21 MED ORDER — ONDANSETRON HCL 4 MG/2ML IJ SOLN
4.0000 mg | Freq: Four times a day (QID) | INTRAMUSCULAR | Status: DC | PRN
  Administered 2023-08-21: 4 mg via INTRAVENOUS
  Filled 2023-08-21: qty 2

## 2023-08-21 MED ORDER — HYDROMORPHONE HCL 1 MG/ML IJ SOLN
1.0000 mg | Freq: Once | INTRAMUSCULAR | Status: AC
  Administered 2023-08-21: 1 mg via INTRAVENOUS
  Filled 2023-08-21: qty 1

## 2023-08-21 MED ORDER — ALBUTEROL SULFATE (2.5 MG/3ML) 0.083% IN NEBU: 2.5000 mg | INHALATION_SOLUTION | Freq: Four times a day (QID) | RESPIRATORY_TRACT | Status: DC | PRN

## 2023-08-21 MED ORDER — ALPRAZOLAM 0.25 MG PO TABS
0.2500 mg | ORAL_TABLET | Freq: Three times a day (TID) | ORAL | Status: DC | PRN
  Administered 2023-08-21 – 2023-08-23 (×4): 0.25 mg via ORAL
  Filled 2023-08-21 (×4): qty 1

## 2023-08-21 MED ORDER — SODIUM CHLORIDE 0.9 % IV SOLN
3.0000 g | Freq: Four times a day (QID) | INTRAVENOUS | Status: DC
  Administered 2023-08-21 – 2023-08-23 (×8): 3 g via INTRAVENOUS
  Filled 2023-08-21 (×10): qty 8

## 2023-08-21 MED ORDER — OXYCODONE-ACETAMINOPHEN 10-325 MG PO TABS: 1.0000 | ORAL_TABLET | ORAL | Status: DC | PRN

## 2023-08-21 MED ORDER — VALGANCICLOVIR HCL 450 MG PO TABS
900.0000 mg | ORAL_TABLET | Freq: Two times a day (BID) | ORAL | Status: DC
  Administered 2023-08-21 – 2023-08-23 (×5): 900 mg via ORAL
  Filled 2023-08-21 (×6): qty 2

## 2023-08-21 MED ORDER — HALOPERIDOL LACTATE 5 MG/ML IJ SOLN
5.0000 mg | Freq: Once | INTRAMUSCULAR | Status: AC
  Administered 2023-08-21: 5 mg via INTRAVENOUS
  Filled 2023-08-21: qty 1

## 2023-08-21 MED ORDER — DULOXETINE HCL 30 MG PO CPEP
60.0000 mg | ORAL_CAPSULE | Freq: Every day | ORAL | Status: DC
  Administered 2023-08-21 – 2023-08-23 (×3): 60 mg via ORAL
  Filled 2023-08-21: qty 1
  Filled 2023-08-21 (×2): qty 2

## 2023-08-21 MED ORDER — OXYCODONE HCL 5 MG PO TABS
5.0000 mg | ORAL_TABLET | ORAL | Status: DC | PRN
  Administered 2023-08-21 – 2023-08-23 (×7): 5 mg via ORAL
  Filled 2023-08-21 (×7): qty 1

## 2023-08-21 MED ORDER — NALOXONE HCL 4 MG/0.1ML NA LIQD: 1.0000 | Freq: Once | NASAL | Status: DC

## 2023-08-21 MED ORDER — TRAMADOL HCL 50 MG PO TABS
50.0000 mg | ORAL_TABLET | Freq: Four times a day (QID) | ORAL | Status: DC | PRN
  Administered 2023-08-21 – 2023-08-23 (×2): 50 mg via ORAL
  Filled 2023-08-21 (×2): qty 1

## 2023-08-21 MED ORDER — HYDROMORPHONE HCL 1 MG/ML IJ SOLN
1.0000 mg | Freq: Once | INTRAMUSCULAR | Status: AC
Start: 2023-08-21 — End: 2023-08-21
  Administered 2023-08-21: 1 mg via INTRAVENOUS
  Filled 2023-08-21: qty 1

## 2023-08-21 MED ORDER — HYDROMORPHONE HCL 1 MG/ML IJ SOLN
0.5000 mg | INTRAMUSCULAR | Status: DC | PRN
  Administered 2023-08-21 – 2023-08-23 (×11): 0.5 mg via INTRAVENOUS
  Filled 2023-08-21 (×11): qty 0.5

## 2023-08-21 MED ORDER — DOXEPIN HCL 3 MG PO TABS: 1.0000 | ORAL_TABLET | Freq: Every day | ORAL | Status: DC

## 2023-08-21 MED ORDER — PANTOPRAZOLE SODIUM 40 MG PO TBEC
40.0000 mg | DELAYED_RELEASE_TABLET | Freq: Every day | ORAL | Status: DC
  Administered 2023-08-21 – 2023-08-23 (×3): 40 mg via ORAL
  Filled 2023-08-21 (×3): qty 1

## 2023-08-21 MED ORDER — GADOBUTROL 1 MMOL/ML IV SOLN
7.0000 mL | Freq: Once | INTRAVENOUS | Status: AC | PRN
  Administered 2023-08-21: 7 mL via INTRAVENOUS

## 2023-08-21 MED ORDER — KETAMINE HCL 50 MG/5ML IJ SOSY
20.0000 mg | PREFILLED_SYRINGE | Freq: Once | INTRAMUSCULAR | Status: AC
  Administered 2023-08-21: 20 mg via INTRAVENOUS
  Filled 2023-08-21: qty 5

## 2023-08-21 MED ORDER — TRAMADOL HCL ER 300 MG PO TB24: 300.0000 mg | ORAL_TABLET | Freq: Every day | ORAL | Status: DC

## 2023-08-21 MED ORDER — CYANOCOBALAMIN 500 MCG PO TABS
500.0000 ug | ORAL_TABLET | Freq: Every day | ORAL | Status: DC
  Administered 2023-08-21 – 2023-08-23 (×3): 500 ug via ORAL
  Filled 2023-08-21 (×3): qty 1

## 2023-08-21 MED ORDER — ENOXAPARIN SODIUM 40 MG/0.4ML IJ SOSY
40.0000 mg | PREFILLED_SYRINGE | INTRAMUSCULAR | Status: DC
  Filled 2023-08-21 (×3): qty 0.4

## 2023-08-21 NOTE — Consult Note (Signed)
SURGICAL CONSULTATION NOTE   HISTORY OF PRESENT ILLNESS (HPI):  47 y.o. female presented to [facility] ED for evaluation of abdominal pain. Patient reports she been having worsening abdominal pain for the last 3 days.  Initially she thought that it was due to recent colitis.  She was recently admitted due to colitis.  Patient had history of metastatic malignancy.  Pain is mainly in the upper abdomen and radiates to the upper chest.  Patient cannot identify any alleviating or aggravating factors.  At the ED she was found with pain in the upper abdomen.  There has been no fever, heart rate of 70.  Adequate blood pressures.  Labs without leukocytosis.  Mildly elevated alkaline phosphatase, chronically.  Patient had a CT scan of the chest that shows patchy infiltrates in the lungs concerning for multifocal pneumonia.  CT scan of the abdomen shows stranding around the gallbladder.  She also had an abdominal ultrasound that shows cholelithiasis with pericholecystic fluid concerning for cholecystitis.  She had an MRCP due to elevated alkaline phosphatase.  This was also consistent with cholecystitis without choledocholithiasis.  I personally evaluated these images.  Surgery is consulted by Dr. Derrill Kay in this context for evaluation and management of cholecystitis.  PAST MEDICAL HISTORY (PMH):  Past Medical History:  Diagnosis Date   Anxiety    Cancer (HCC)    Depression    Heart murmur    Hypertension      PAST SURGICAL HISTORY (PSH):  Past Surgical History:  Procedure Laterality Date   BIOPSY  07/26/2023   Procedure: BIOPSY;  Surgeon: Toney Reil, MD;  Location: [facility] ENDOSCOPY;  Service: Gastroenterology;;   CESAREAN SECTION     COLONOSCOPY WITH PROPOFOL N/A 02/21/2023   Procedure: COLONOSCOPY WITH PROPOFOL;  Surgeon: Regis Bill, MD;  Location: [facility] ENDOSCOPY;  Service: Endoscopy;  Laterality: N/A;   DILATION AND CURETTAGE OF UTERUS     FLEXIBLE SIGMOIDOSCOPY N/A 07/26/2023    Procedure: FLEXIBLE SIGMOIDOSCOPY;  Surgeon: Toney Reil, MD;  Location: [facility] ENDOSCOPY;  Service: Gastroenterology;  Laterality: N/A;   IR IMAGING GUIDED PORT INSERTION  02/16/2023     MEDICATIONS:  Prior to Admission medications   Medication Sig Start Date End Date Taking? Authorizing Provider  albuterol (PROVENTIL) (2.5 MG/3ML) 0.083% nebulizer solution Take 2.5 mg by nebulization every 6 (six) hours as needed for wheezing or shortness of breath.    [provider]  albuterol (VENTOLIN HFA) 108 (90 Base) MCG/ACT inhaler Inhale 2 puffs into the lungs every 6 (six) hours as needed.    [provider]  busPIRone (BUSPAR) 5 MG tablet Take 5 mg by mouth 2 (two) times daily. 05/31/23   [provider]  cyanocobalamin (VITAMIN B12) 500 MCG tablet Take 500 mcg by mouth daily.    [provider]  cyclobenzaprine (FEXMID) 7.5 MG tablet Take 1 tablet (7.5 mg total) by mouth 3 (three) times daily as needed for muscle spasms. 08/04/23   Enedina Finner, MD  dexamethasone (DECADRON) 2 MG tablet Take 1 tablet (2 mg total) by mouth daily. 08/16/23   Borders, Daryl Eastern, NP  Doxepin HCl 3 MG TABS Take 1 tablet by mouth at bedtime. 06/29/23   [provider]  DULoxetine (CYMBALTA) 60 MG capsule Take 60 mg by mouth daily. 07/11/23   [provider]  feeding supplement (ENSURE ENLIVE / ENSURE PLUS) LIQD Take 237 mLs by mouth 3 (three) times daily between meals. 08/04/23   Enedina Finner, MD  gabapentin (NEURONTIN) 300  MG capsule Take 300 mg by mouth 3 (three) times daily.    [provider]  hydrocortisone 2.5 % cream Apply 1 Application topically 2 (two) times daily. 03/26/23   [provider]  lidocaine-prilocaine (EMLA) cream Apply to affected area once 02/25/23   Jeralyn Ruths, MD  megestrol (MEGACE) 40 MG tablet TAKE 1 TABLET BY MOUTH EVERY DAY 06/27/23   Jeralyn Ruths, MD  Multiple Vitamin (MULTIVITAMIN) tablet Take 1 tablet by mouth  daily.    [provider]  naloxone [facility]) nasal spray 4 mg/0.1 mL Place 1 spray into the nose once. 02/09/23   [provider]  omeprazole (PRILOSEC) 20 MG capsule Take 20 mg by mouth daily.    [provider]  ondansetron (ZOFRAN) 8 MG tablet Take 1 tablet (8 mg total) by mouth every 8 (eight) hours as needed for nausea or vomiting. Start on the third day after chemotherapy. 02/25/23   Jeralyn Ruths, MD  oxyCODONE-acetaminophen (PERCOCET) 10-325 MG tablet Take 1 tablet by mouth every 4 (four) hours as needed. 06/29/23   [provider]  polyethylene glycol (MIRALAX / GLYCOLAX) 17 g packet Take 17 g by mouth daily as needed. 08/04/23   Enedina Finner, MD  prochlorperazine (COMPAZINE) 10 MG tablet Take 1 tablet (10 mg total) by mouth every 6 (six) hours as needed for nausea or vomiting. 02/25/23   Jeralyn Ruths, MD  traMADol (ULTRAM-ER) 300 MG 24 hr tablet Take 300 mg by mouth daily. 07/11/23   [provider]  valGANciclovir (VALCYTE) 450 MG tablet Take 2 tablets (900 mg total) by mouth 2 (two) times daily for 28 days. 08/04/23 09/01/23  Enedina Finner, MD  Vitamin D, Ergocalciferol, (DRISDOL) 1.25 MG (50000 UNIT) CAPS capsule Take 50,000 Units by mouth every 7 (seven) days. 01/30/23   [provider]     ALLERGIES:  Allergies  Allergen Reactions   Nifedipine Palpitations     SOCIAL HISTORY:  Social History   Socioeconomic History   Marital status: Divorced    Spouse name: Not on file   Number of children: Not on file   Years of education: Not on file   Highest education level: Not on file  Occupational History   Not on file  Tobacco Use   Smoking status: Former    Current packs/day: 0.00    Types: Cigarettes    Quit date: 12/19/2022    Years since quitting: 0.6   Smokeless tobacco: Never   Tobacco comments:    Quit smoking 12/2022  Vaping Use   Vaping status: Never Used  Substance and Sexual Activity   Alcohol use: Yes     Comment: occ   Drug use: No   Sexual activity: Yes  Other Topics Concern   Not on file  Social History Narrative   Not on file   Social Determinants of Health   Financial Resource Strain: Low Risk  (05/09/2023)   Received from [facility]   Overall Financial Resource Strain (CARDIA)    Difficulty of Paying Living Expenses: Not hard at all  Recent Concern: Financial Resource Strain - Medium Risk (02/15/2023)   Overall Financial Resource Strain (CARDIA)    Difficulty of Paying Living Expenses: Somewhat hard  Food Insecurity: Food Insecurity Present (07/22/2023)   Hunger Vital Sign    Worried About Running Out of Food in the Last Year: Sometimes true    Ran Out of Food in the Last Year:  Sometimes true  Transportation Needs: Unmet Transportation Needs (07/22/2023)   PRAPARE - Transportation    Lack of Transportation (Medical): Yes    Lack of Transportation (Non-Medical): Yes  Physical Activity: Inactive (02/15/2023)   Exercise Vital Sign    Days of Exercise per Week: 0 days    Minutes of Exercise per Session: 0 min  Stress: Stress Concern Present (02/15/2023)   [employer] of Occupational Health - Occupational Stress Questionnaire    Feeling of Stress : To some extent  Social Connections: Socially Isolated (02/15/2023)   Social Connection and Isolation Panel [NHANES]    Frequency of Communication with Friends and Family: More than three times a week    Frequency of Social Gatherings with Friends and Family: Once a week    Attends Religious Services: Never    Database administrator or Organizations: No    Attends Banker Meetings: Never    Marital Status: Divorced  Catering manager Violence: Not At Risk (07/22/2023)   Humiliation, Afraid, Rape, and Kick questionnaire    Fear of Current or Ex-Partner: No    Emotionally Abused: No    Physically Abused: No    Sexually Abused: No      FAMILY HISTORY:  Family History   Problem Relation Age of Onset   Cancer Maternal Grandmother      REVIEW OF SYSTEMS:  Constitutional: denies weight loss, fever, chills, or sweats  Eyes: denies any other vision changes, history of eye injury  ENT: denies sore throat, hearing problems  Respiratory: denies shortness of breath, wheezing  Cardiovascular: denies chest pain, palpitations  Gastrointestinal: positive abdominal pain, nausea and vomiting Genitourinary: denies burning with urination or urinary frequency Musculoskeletal: denies any other joint pains or cramps  Skin: denies any other rashes or skin discolorations  Neurological: denies any other headache, dizziness, weakness  Psychiatric: denies any other depression, anxiety   All other review of systems were negative   VITAL SIGNS:  Temp:  [98 F (36.7 C)-98.2 F (36.8 C)] 98.1 F (36.7 C) (10/20 0400) Pulse Rate:  [70-125] 70 (10/20 0835) Resp:  [12-24] 23 (10/20 0835) BP: (110-164)/(79-118) 142/95 (10/20 0835) SpO2:  [95 %-100 %] 95 % (10/20 0835) Weight:  [73 kg] 73 kg (10/19 1413)     Height: 5\' 6"  (167.6 cm) Weight: 73 kg BMI (Calculated): 25.99   INTAKE/OUTPUT:  This shift: No intake/output data recorded.  Last 2 shifts: @IOLAST2SHIFTS @   PHYSICAL EXAM:  Constitutional:  -- Normal body habitus  -- Awake, alert, and oriented x3  Eyes:  -- Pupils equally round and reactive to light  -- No scleral icterus  Ear, nose, and throat:  -- No jugular venous distension  Pulmonary:  -- No crackles  -- Equal breath sounds bilaterally -- Breathing non-labored at rest Cardiovascular:  -- S1, S2 present  -- No pericardial rubs Gastrointestinal:  -- Abdomen soft, tender in the right upper quadrant, non-distended, no guarding or rebound tenderness -- No abdominal masses appreciated, pulsatile or otherwise  Musculoskeletal and Integumentary:  -- Wounds: None appreciated -- Extremities: B/L UE and LE FROM, hands and feet warm, no edema  Neurologic:   -- Motor function: intact and symmetric -- Sensation: intact and symmetric   Labs:     Latest Ref Rng & Units 08/20/2023    2:16 PM 08/15/2023    8:39 AM 08/11/2023   11:21 AM  CBC  WBC 4.0 - 10.5 K/uL 4.5  6.0  4.5   Hemoglobin 12.0 -  15.0 g/dL 9.4  9.1  8.2   Hematocrit 36.0 - 46.0 % 28.9  28.2  25.3   Platelets 150 - 400 K/uL 181  112  81       Latest Ref Rng & Units 08/20/2023    4:19 PM 08/20/2023    2:16 PM 08/15/2023    8:39 AM  CMP  Glucose 70 - 99 mg/dL  401  86   BUN 6 - 20 mg/dL  13  11   Creatinine 0.27 - 1.00 mg/dL  2.53  6.64   Sodium 403 - 145 mmol/L  131  130   Potassium 3.5 - 5.1 mmol/L  3.8  3.4   Chloride 98 - 111 mmol/L  97  101   CO2 22 - 32 mmol/L  23  22   Calcium 8.9 - 10.3 mg/dL  8.3  8.3   Total Protein 6.5 - 8.1 g/dL 6.5   6.5   Total Bilirubin 0.3 - 1.2 mg/dL 0.7   0.5   Alkaline Phos 38 - 126 U/L 332   283   AST 15 - 41 U/L 69   25   ALT 0 - 44 U/L 43   17     Imaging studies:  EXAM: MRI ABDOMEN WITHOUT AND WITH CONTRAST (INCLUDING MRCP)   TECHNIQUE: Multiplanar multisequence MR imaging of the abdomen was performed both before and after the administration of intravenous contrast. Heavily T2-weighted images of the biliary and pancreatic ducts were obtained, and three-dimensional MRCP images were rendered by post processing.   CONTRAST:  7mL GADAVIST GADOBUTROL 1 MMOL/ML IV SOLN   COMPARISON:  No prior abdominal MRI. CT of the abdomen and pelvis 08/20/2023.   FINDINGS: Lower chest: Elevation of the right hemidiaphragm. Multiple prominent borderline enlarged juxta diaphragmatic lymph nodes are noted on the right measuring up to 8 mm in short axis (axial image 21 of series 26).   Hepatobiliary: Multiple filling defects are noted within the gallbladder, most notably a 1.9 cm filling defect near the neck of the gallbladder, indicative of gallstones. Gallbladder is severely distended. Gallbladder wall appears thickened and  edematous with trace volume of pericholecystic fluid. MRCP images are limited by patient respiratory motion. With this limitation in mind, no definite filling defect within the common bile duct to suggest choledocholithiasis. Common bile duct is dilated measuring up to 11 mm. There is abrupt tapering of the distal common bile duct shortly above the level of the ampulla, suggesting a benign stricture. No intrahepatic biliary ductal dilatation noted at this time. Mild increased T2 signal intensity in a periportal distribution, indicative of periportal edema. No suspicious cystic or solid hepatic lesions.   Pancreas: No pancreatic mass. No pancreatic ductal dilatation. No pancreatic or peripancreatic fluid collections or inflammatory changes.   Spleen:  Unremarkable.   Adrenals/Urinary Tract: Bilateral kidneys and adrenal glands are normal in appearance. No hydroureteronephrosis in the visualized portions of the abdomen.   Stomach/Bowel: Visualized portions are unremarkable.   Vascular/Lymphatic: Atherosclerosis of the abdominal aorta. No aneurysm identified in the visualized abdominal vasculature. No lymphadenopathy noted in the abdomen.   Other: No significant volume of ascites noted in the visualized portions of the peritoneal cavity.   Musculoskeletal: Innumerable small areas of heterogeneous enhancement noted throughout the visualized axial skeleton, indicative of widespread metastatic disease to the bones. Multiple prominent borderline enlarged enhancing lymph nodes in the lower left axial region partially imaged.   IMPRESSION: 1. Cholelithiasis with evidence of acute cholecystitis. Surgical consultation is recommended.  2. No choledocholithiasis. 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 This is associated with common bile duct dilatation, but no frank intrahepatic biliary ductal dilatation is noted at this time. 3. Diffuse  periportal edema in the liver. 4. Borderline enlarged juxta diaphragmatic lymph nodes and left axillary lymph nodes, likely metastatic. Widespread metastatic disease to the bones redemonstrated.     Electronically Signed   By: Trudie Reed M.D.   On: 08/21/2023 07:41  Assessment/Plan:  47 y.o. 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  -I discussed with patient the diagnosis of acute cholecystitis.  Due to her concern of multifocal pneumonia, malignancy patient is not the best candidate for surgical intervention.  Depending on her pulmonary status we can consider cholecystectomy versus percutaneous cholecystostomy.  In the meantime keep patient n.p.o. and IV antibiotic therapy.  Consider Zosyn for treatment of cholecystitis.  -Will continue close follow-up.  I will keep discussing with the patient her alternatives depending on her clinical status.  Gae Gallop, MD

## 2023-08-21 NOTE — ED Notes (Signed)
Pt returned from MRI °

## 2023-08-21 NOTE — ED Notes (Signed)
Pt stating her anxiety has gotten really high, pt asking for anxiety meds. Dr. Chipper Herb notified

## 2023-08-21 NOTE — ED Notes (Signed)
Pt stating she was going to leave if placed in the hallway. After speaking with pt she has decided to stay.

## 2023-08-21 NOTE — ED Notes (Signed)
Pt left to MRI

## 2023-08-21 NOTE — Consult Note (Signed)
Pharmacy Antibiotic Note  Sheryl Silva is a 47 y.o. female admitted on 08/20/2023 with  an intra-abdominal infection . 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 Per surgery, patient has cholecystitis and concern for potential aspiration pneumonia. Pharmacy has been consulted for Unasyn dosing.  Plan: Unasyn IV 3 g q6h  Monitor culture results and deescalate antibiotics as needed   Height: 5\' 6"  (167.6 cm) Weight: 73 kg (160 lb 15 oz) IBW/kg (Calculated) : 59.3  Temp (24hrs), Avg:98.1 F (36.7 C), Min:98 F (36.7 C), Max:98.2 F (36.8 C)  Recent Labs  Lab 08/15/23 0839 08/20/23 1416 08/21/23 0021 08/21/23 0401  WBC 6.0 4.5  --   --   CREATININE 0.75 0.65  --   --   LATICACIDVEN  --   --  0.7 0.7    Estimated Creatinine Clearance: 88 mL/min (by C-G formula based on SCr of 0.65 mg/dL).    Allergies  Allergen Reactions   Nifedipine Palpitations    Antimicrobials this admission: 10/20 vancomycin 1500 mg x 1 10/20 cefepime 2 g x 1   Dose adjustments this admission:   Microbiology results: 10/20 BCx: NGTD  Thank you for allowing pharmacy to be a part of this patient's care.  Karlton Lemon, PharmD Candidate  08/21/2023 10:40 AM

## 2023-08-21 NOTE — H&P (Signed)
History and Physical    Sheryl Silva WUX:[id number] DOB: 01-22-1976 DOA: 08/20/2023  PCP: [name], MD (Confirm with patient/family/NH records and if not entered, this has to be entered at [facility] point of entry) Patient coming from: Home  I have personally briefly reviewed patient's old medical records in [facility]  Chief Complaint: Belly hurts  HPI: Sheryl Silva is a 47 y.o. 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  Symptoms started 3 days ago with new onset of right upper quadrant abdominal pain cramping-like, episodic lasted for 1 to 2 hours, appears to have no correlation with meals intake but does admitted to have more crampings and night and frequently waking up.  Denies any fever or chills.  She also reported right lower chest sharp pain worsening with deep breath.  Denies any cough no fever or chills.  She was diagnosed with CMV colitis and was discharged home on valganciclovir, she felt that the medication gave her a lot of stomach upset and nauseous but denied any vomiting.  She also reported has been having poorly controlled GERD with frequent heartburns for which she has been taking as needed PPI.  No diarrhea.  ED Course: Afebrile, tachycardia heart rate 1 10-1 20s.  RUQ ultrasound showed sonographic finding suspicious for acute cholecystitis and gallstones and sludge and gallbladder wall thickening.  RCP showed no CBD stone but Izora Ribas is sepsis with evidence of acute cholecystitis.  Blood work showed WBC 4.5, K3.8, sodium 131, potassium 3.8.  AST 69, ALT 43.  CTA showed negative PE but multifocal pneumonia.  Negative DVT study.  Patient was started on vancomycin and cefepime x 1 in the ED.  Multiple rounds of morphine and Zofran given as  well to control her symptoms.  Surgical consultation required who recommend conservative management.  Review of Systems: As per HPI otherwise 14 point review of systems negative.    Past Medical History:  Diagnosis Date   Anxiety    Cancer (HCC)    Depression    Heart murmur    Hypertension     Past Surgical History:  Procedure Laterality Date   BIOPSY  07/26/2023   Procedure: BIOPSY;  Surgeon: Toney Reil, MD;  Location: [facility] ENDOSCOPY;  Service: Gastroenterology;;   CESAREAN SECTION     COLONOSCOPY WITH PROPOFOL N/A 02/21/2023   Procedure: COLONOSCOPY WITH PROPOFOL;  Surgeon: Regis Bill, MD;  Location: [facility] ENDOSCOPY;  Service: Endoscopy;  Laterality: N/A;   DILATION AND CURETTAGE OF UTERUS     FLEXIBLE SIGMOIDOSCOPY N/A 07/26/2023   Procedure: FLEXIBLE SIGMOIDOSCOPY;  Surgeon: Toney Reil, MD;  Location: [facility] ENDOSCOPY;  Service: Gastroenterology;  Laterality: N/A;   IR IMAGING GUIDED PORT INSERTION  02/16/2023     reports that she quit smoking about 8 months ago. Her smoking use included cigarettes. She has never used smokeless tobacco. She reports current alcohol use. She reports that she does not use drugs.  Allergies  Allergen Reactions   Nifedipine Palpitations    Family History  Problem Relation Age of Onset   Cancer Maternal Grandmother      Prior to Admission medications   Medication Sig Start Date End Date Taking? Authorizing Provider  albuterol (PROVENTIL) (2.5 MG/3ML) 0.083% nebulizer solution Take 2.5 mg by nebulization every 6 (six) hours as needed for wheezing or shortness  of breath.    [provider]  albuterol (VENTOLIN HFA) 108 (90 Base) MCG/ACT inhaler Inhale 2 puffs into the lungs every 6 (six) hours as needed.    [provider]  busPIRone (BUSPAR) 5 MG tablet Take 5 mg by mouth 2 (two) times daily. 05/31/23   [provider]  cyanocobalamin (VITAMIN B12) 500 MCG tablet Take 500 mcg by mouth daily.     [provider]  cyclobenzaprine (FEXMID) 7.5 MG tablet Take 1 tablet (7.5 mg total) by mouth 3 (three) times daily as needed for muscle spasms. 08/04/23   Enedina Finner, MD  dexamethasone (DECADRON) 2 MG tablet Take 1 tablet (2 mg total) by mouth daily. 08/16/23   Borders, Daryl Eastern, NP  Doxepin HCl 3 MG TABS Take 1 tablet by mouth at bedtime. 06/29/23   [provider]  DULoxetine (CYMBALTA) 60 MG capsule Take 60 mg by mouth daily. 07/11/23   [provider]  feeding supplement (ENSURE ENLIVE / ENSURE PLUS) LIQD Take 237 mLs by mouth 3 (three) times daily between meals. 08/04/23   Enedina Finner, MD  gabapentin (NEURONTIN) 300 MG capsule Take 300 mg by mouth 3 (three) times daily.    [provider]  hydrocortisone 2.5 % cream Apply 1 Application topically 2 (two) times daily. 03/26/23   [provider]  lidocaine-prilocaine (EMLA) cream Apply to affected area once 02/25/23   Jeralyn Ruths, MD  megestrol (MEGACE) 40 MG tablet TAKE 1 TABLET BY MOUTH EVERY DAY 06/27/23   Jeralyn Ruths, MD  Multiple Vitamin (MULTIVITAMIN) tablet Take 1 tablet by mouth daily.    [provider]  naloxone [facility]) nasal spray 4 mg/0.1 mL Place 1 spray into the nose once. 02/09/23   [provider]  omeprazole (PRILOSEC) 20 MG capsule Take 20 mg by mouth daily.    [provider]  ondansetron (ZOFRAN) 8 MG tablet Take 1 tablet (8 mg total) by mouth every 8 (eight) hours as needed for nausea or vomiting. Start on the third day after chemotherapy. 02/25/23   Jeralyn Ruths, MD  oxyCODONE-acetaminophen (PERCOCET) 10-325 MG tablet Take 1 tablet by mouth every 4 (four) hours as needed. 06/29/23   [provider]  polyethylene glycol (MIRALAX / GLYCOLAX) 17 g packet Take 17 g by mouth daily as needed. 08/04/23   Enedina Finner, MD  prochlorperazine (COMPAZINE) 10 MG tablet Take 1 tablet (10 mg total) by mouth every 6 (six) hours as needed for nausea  or vomiting. 02/25/23   Jeralyn Ruths, MD  traMADol (ULTRAM-ER) 300 MG 24 hr tablet Take 300 mg by mouth daily. 07/11/23   [provider]  valGANciclovir (VALCYTE) 450 MG tablet Take 2 tablets (900 mg total) by mouth 2 (two) times daily for 28 days. 08/04/23 09/01/23  Enedina Finner, MD  Vitamin D, Ergocalciferol, (DRISDOL) 1.25 MG (50000 UNIT) CAPS capsule Take 50,000 Units by mouth every 7 (seven) days. 01/30/23   [provider]    Physical Exam: Vitals:   08/21/23 0655 08/21/23 0730 08/21/23 0800 08/21/23 0835  BP: (!) 139/91   (!) 142/95  Pulse: (!) 120 (!) 122 (!) 117 70  Resp: 20 (!) 23 (!) 24 (!) 23  Temp:      TempSrc:      SpO2: 100% 97% 98% 95%  Weight:      Height:        Constitutional: NAD, calm, comfortable Vitals:   08/21/23 0655 08/21/23 0730 08/21/23 0800 08/21/23  0835  BP: (!) 139/91   (!) 142/95  Pulse: (!) 120 (!) 122 (!) 117 70  Resp: 20 (!) 23 (!) 24 (!) 23  Temp:      TempSrc:      SpO2: 100% 97% 98% 95%  Weight:      Height:       Eyes: PERRL, lids and conjunctivae normal ENMT: Mucous membranes are moist. Posterior pharynx clear of any exudate or lesions.Normal dentition.  Neck: normal, supple, no masses, no thyromegaly Respiratory: clear to auscultation bilaterally, no wheezing, no crackles. Normal respiratory effort. No accessory muscle use.  Cardiovascular: Regular rate and rhythm, no murmurs / rubs / gallops. No extremity edema. 2+ pedal pulses. No carotid bruits.  Abdomen: RUQ tenderness, no masses palpated. No hepatosplenomegaly. Bowel sounds positive.  Musculoskeletal: no clubbing / cyanosis. No joint deformity upper and lower extremities. Good ROM, no contractures. Normal muscle tone.  Skin: no rashes, lesions, ulcers. No induration Neurologic: CN 2-12 grossly intact. Sensation intact, DTR normal. Strength 5/5 in all 4.  Psychiatric: Normal judgment and insight. Alert and oriented x 3. Normal mood.     Labs on Admission: I  have personally reviewed following labs and imaging studies  CBC: Recent Labs  Lab 08/15/23 0839 08/20/23 1416  WBC 6.0 4.5  NEUTROABS 5.3  --   HGB 9.1* 9.4*  HCT 28.2* 28.9*  MCV 95.3 96.0  PLT 112* 181   Basic Metabolic Panel: Recent Labs  Lab 08/15/23 0839 08/20/23 1416 08/20/23 1619  NA 130* 131*  --   K 3.4* 3.8  --   CL 101 97*  --   CO2 22 23  --   GLUCOSE 86 107*  --   BUN 11 13  --   CREATININE 0.75 0.65  --   CALCIUM 8.3* 8.3*  --   MG  --   --  1.6*  PHOS  --   --  4.2   GFR: Estimated Creatinine Clearance: 88 mL/min (by C-G formula based on SCr of 0.65 mg/dL). Liver Function Tests: Recent Labs  Lab 08/15/23 0839 08/20/23 1619  AST 25 69*  ALT 17 43  ALKPHOS 283* 332*  BILITOT 0.5 0.7  PROT 6.5 6.5  ALBUMIN 2.6* 2.7*   Recent Labs  Lab 08/20/23 1619 08/20/23 1830  LIPASE 19 18   No results for input(s): "AMMONIA" in the last 168 hours. Coagulation Profile: No results for input(s): "INR", "PROTIME" in the last 168 hours. Cardiac Enzymes: Recent Labs  Lab 08/20/23 1619  CKTOTAL 53   BNP (last 3 results) No results for input(s): "PROBNP" in the last 8760 hours. HbA1C: No results for input(s): "HGBA1C" in the last 72 hours. CBG: No results for input(s): "GLUCAP" in the last 168 hours. Lipid Profile: No results for input(s): "CHOL", "HDL", "LDLCALC", "TRIG", "CHOLHDL", "LDLDIRECT" in the last 72 hours. Thyroid Function Tests: Recent Labs    08/20/23 1619  TSH 0.576   Anemia Panel: No results for input(s): "VITAMINB12", "FOLATE", "FERRITIN", "TIBC", "IRON", "RETICCTPCT" in the last 72 hours. Urine analysis:    Component Value Date/Time   COLORURINE YELLOW (A) 07/22/2023 0349   APPEARANCEUR HAZY (A) 07/22/2023 0349   LABSPEC >1.046 (H) 07/22/2023 0349   PHURINE 6.0 07/22/2023 0349   GLUCOSEU NEGATIVE 07/22/2023 0349   HGBUR NEGATIVE 07/22/2023 0349   BILIRUBINUR NEGATIVE 07/22/2023 0349   KETONESUR NEGATIVE 07/22/2023 0349    PROTEINUR NEGATIVE 07/22/2023 0349   NITRITE NEGATIVE 07/22/2023 0349   LEUKOCYTESUR NEGATIVE 07/22/2023 0349  Radiological Exams on Admission: MR ABDOMEN MRCP W WO CONTAST  Result Date: 08/21/2023 CLINICAL DATA:  47 year old female with history of transaminitis and abdominal pain. Gallstones. Evaluate for choledocholithiasis. EXAM: MRI ABDOMEN WITHOUT AND WITH CONTRAST (INCLUDING MRCP) TECHNIQUE: Multiplanar multisequence MR imaging of the abdomen was performed both before and after the administration of intravenous contrast. Heavily T2-weighted images of the biliary and pancreatic ducts were obtained, and three-dimensional MRCP images were rendered by post processing. CONTRAST:  7mL GADAVIST GADOBUTROL 1 MMOL/ML IV SOLN COMPARISON:  No prior abdominal MRI. CT of the abdomen and pelvis 08/20/2023. FINDINGS: Lower chest: Elevation of the right hemidiaphragm. Multiple prominent borderline enlarged juxta diaphragmatic lymph nodes are noted on the right measuring up to 8 mm in short axis (axial image 21 of series 26). Hepatobiliary: Multiple filling defects are noted within the gallbladder, most notably a 1.9 cm filling defect near the neck of the gallbladder, indicative of gallstones. Gallbladder is severely distended. Gallbladder wall appears thickened and edematous with trace volume of pericholecystic fluid. MRCP images are limited by patient respiratory motion. With this limitation in mind, no definite filling defect within the common bile duct to suggest choledocholithiasis. Common bile duct is dilated measuring up to 11 mm. There is abrupt tapering of the distal common bile duct shortly above the level of the ampulla, suggesting a benign stricture. No intrahepatic biliary ductal dilatation noted at this time. Mild increased T2 signal intensity in a periportal distribution, indicative of periportal edema. No suspicious cystic or solid hepatic lesions. Pancreas: No pancreatic mass. No pancreatic ductal  dilatation. No pancreatic or peripancreatic fluid collections or inflammatory changes. Spleen:  Unremarkable. Adrenals/Urinary Tract: Bilateral kidneys and adrenal glands are normal in appearance. No hydroureteronephrosis in the visualized portions of the abdomen. Stomach/Bowel: Visualized portions are unremarkable. Vascular/Lymphatic: Atherosclerosis of the abdominal aorta. No aneurysm identified in the visualized abdominal vasculature. No lymphadenopathy noted in the abdomen. Other: No significant volume of ascites noted in the visualized portions of the peritoneal cavity. Musculoskeletal: Innumerable small areas of heterogeneous enhancement noted throughout the visualized axial skeleton, indicative of widespread metastatic disease to the bones. Multiple prominent borderline enlarged enhancing lymph nodes in the lower left axial region partially imaged. IMPRESSION: 1. Cholelithiasis with evidence of acute cholecystitis. Surgical consultation is recommended. 2. No choledocholithiasis. 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 This is associated with common bile duct dilatation, but no frank intrahepatic biliary ductal dilatation is noted at this time. 3. Diffuse periportal edema in the liver. 4. Borderline enlarged juxta diaphragmatic lymph nodes and left axillary lymph nodes, likely metastatic. Widespread metastatic disease to the bones redemonstrated. Electronically Signed   By: Trudie Reed M.D.   On: 08/21/2023 07:41   MR 3D Recon At Scanner  Result Date: 08/21/2023 CLINICAL DATA:  47 year old female with history of transaminitis and abdominal pain. Gallstones. Evaluate for choledocholithiasis. EXAM: MRI ABDOMEN WITHOUT AND WITH CONTRAST (INCLUDING MRCP) TECHNIQUE: Multiplanar multisequence MR imaging of the abdomen was performed both before and after the administration of intravenous contrast. Heavily T2-weighted images of the biliary and pancreatic ducts were  obtained, and three-dimensional MRCP images were rendered by post processing. CONTRAST:  7mL GADAVIST GADOBUTROL 1 MMOL/ML IV SOLN COMPARISON:  No prior abdominal MRI. CT of the abdomen and pelvis 08/20/2023. FINDINGS: Lower chest: Elevation of the right hemidiaphragm. Multiple prominent borderline enlarged juxta diaphragmatic lymph nodes are noted on the right measuring up to 8 mm in short axis (axial image 21  of series 26). Hepatobiliary: Multiple filling defects are noted within the gallbladder, most notably a 1.9 cm filling defect near the neck of the gallbladder, indicative of gallstones. Gallbladder is severely distended. Gallbladder wall appears thickened and edematous with trace volume of pericholecystic fluid. MRCP images are limited by patient respiratory motion. With this limitation in mind, no definite filling defect within the common bile duct to suggest choledocholithiasis. Common bile duct is dilated measuring up to 11 mm. There is abrupt tapering of the distal common bile duct shortly above the level of the ampulla, suggesting a benign stricture. No intrahepatic biliary ductal dilatation noted at this time. Mild increased T2 signal intensity in a periportal distribution, indicative of periportal edema. No suspicious cystic or solid hepatic lesions. Pancreas: No pancreatic mass. No pancreatic ductal dilatation. No pancreatic or peripancreatic fluid collections or inflammatory changes. Spleen:  Unremarkable. Adrenals/Urinary Tract: Bilateral kidneys and adrenal glands are normal in appearance. No hydroureteronephrosis in the visualized portions of the abdomen. Stomach/Bowel: Visualized portions are unremarkable. Vascular/Lymphatic: Atherosclerosis of the abdominal aorta. No aneurysm identified in the visualized abdominal vasculature. No lymphadenopathy noted in the abdomen. Other: No significant volume of ascites noted in the visualized portions of the peritoneal cavity. Musculoskeletal: Innumerable  small areas of heterogeneous enhancement noted throughout the visualized axial skeleton, indicative of widespread metastatic disease to the bones. Multiple prominent borderline enlarged enhancing lymph nodes in the lower left axial region partially imaged. IMPRESSION: 1. Cholelithiasis with evidence of acute cholecystitis. Surgical consultation is recommended. 2. No choledocholithiasis. 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 This is associated with common bile duct dilatation, but no frank intrahepatic biliary ductal dilatation is noted at this time. 3. Diffuse periportal edema in the liver. 4. Borderline enlarged juxta diaphragmatic lymph nodes and left axillary lymph nodes, likely metastatic. Widespread metastatic disease to the bones redemonstrated. Electronically Signed   By: Trudie Reed M.D.   On: 08/21/2023 07:41   US Venous Img Lower Unilateral Left  Result Date: 08/21/2023 CLINICAL DATA:  Left lower extremity pain.  Abnormal CT. EXAM: LEFT LOWER EXTREMITY VENOUS DOPPLER ULTRASOUND TECHNIQUE: Gray-scale sonography with compression, as well as color and duplex ultrasound, were performed to evaluate the deep venous system(s) from the level of the common femoral vein through the popliteal and proximal calf veins. COMPARISON:  None Available. FINDINGS: VENOUS Normal compressibility of the common femoral, superficial femoral, and popliteal veins, as well as the visualized calf veins. Visualized portions of profunda femoral vein and great saphenous vein unremarkable. No filling defects to suggest DVT on grayscale or color Doppler imaging. Doppler waveforms show normal direction of venous flow, normal respiratory plasticity and response to augmentation. Limited views of the contralateral common femoral vein are unremarkable. OTHER None. Limitations: none IMPRESSION: No evidence of left lower extremity DVT. Electronically Signed   By: Narda Rutherford M.D.   On:  08/21/2023 00:08   US ABDOMEN LIMITED RUQ (LIVER/GB)  Result Date: 08/21/2023 CLINICAL DATA:  Abdominal pain. EXAM: ULTRASOUND ABDOMEN LIMITED RIGHT UPPER QUADRANT COMPARISON:  CT earlier today FINDINGS: Gallbladder: Moderately distended containing multiple intraluminal stones and sludge. Edematous gallbladder wall thickening at 5 mm. No sonographic Murphy sign noted by sonographer. Common bile duct: Diameter: 9 mm.  No visualized choledocholithiasis. Liver: There is central intrahepatic biliary ductal dilatation. No focal lesion identified. Within normal limits in parenchymal echogenicity. Portal vein is patent on color Doppler imaging with normal direction of blood flow towards the liver. Other: No  definite pericholecystic fluid by ultrasound. IMPRESSION: 1. Sonographic findings suspicious for acute cholecystitis. Gallstones and sludge with edematous gallbladder wall thickening. 2. Dilated common bile duct with intrahepatic biliary ductal dilatation. No visualized choledocholithiasis. MRCP could be performed for further assessment of the biliary tree as clinically indicated. Electronically Signed   By: Narda Rutherford M.D.   On: 08/21/2023 00:07   CT Angio Chest PE W and/or Wo Contrast  Result Date: 08/20/2023 CLINICAL DATA:  Pulmonary embolus suspected with high probability. Chest pain in the cancer patient. EXAM: CT ANGIOGRAPHY CHEST WITH CONTRAST TECHNIQUE: Multidetector CT imaging of the chest was performed using the standard protocol during bolus administration of intravenous contrast. Multiplanar CT image reconstructions and MIPs were obtained to evaluate the vascular anatomy. RADIATION DOSE REDUCTION: This exam was performed according to the departmental dose-optimization program which includes automated exposure control, adjustment of the mA and/or kV according to patient size and/or use of iterative reconstruction technique. CONTRAST:  OMNIPAQUE IOHEXOL 350 MG/ML SOLN COMPARISON:  CT  chest 02/03/2023.  PET-CT 07/06/2023 FINDINGS: Cardiovascular: Technically adequate study with good opacification of the central and segmental pulmonary arteries and mild motion artifact. No focal filling defects are demonstrated. No evidence of significant pulmonary embolus. Mild cardiac enlargement. No pericardial effusions. Normal caliber thoracic aorta. No aortic dissection. 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 Esophagus is decompressed. Thyroid gland is unremarkable. Prominent lymphadenopathy in the left axilla and left supraclavicular region. Left axillary lymph nodes measure up to about 2.3 cm short axis dimension. Lymph nodes are similar in size to prior PET-CT. No significant mediastinal lymphadenopathy. Lungs/Pleura: Motion artifact limits examination. There is patchy airspace disease throughout both lungs. This could indicate multifocal pneumonia or edema. No pleural effusions. No pneumothorax. Upper Abdomen: Enlarged lymph nodes in the cardiophrenic angle. No acute abnormalities demonstrated in the visualized upper abdomen. 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 Review of the MIP images confirms the above findings. IMPRESSION: 1. No evidence of significant pulmonary embolus. 2. Patchy airspace disease throughout both lungs likely representing edema or multifocal pneumonia. 3. Metastatic lymphadenopathy in the left axilla and supraclavicular region. 4. Diffuse sclerotic skeletal metastasis. Electronically Signed   By: Burman Nieves M.D.   On: 08/20/2023 21:42   CT ABDOMEN PELVIS W CONTRAST  Result Date: 08/20/2023 CLINICAL DATA:  Abdominal pain, acute, nonlocalized. Current cancer patient. Pain in abdomen. EXAM: CT ABDOMEN AND PELVIS WITH CONTRAST TECHNIQUE: Multidetector CT imaging of the abdomen and pelvis was performed using the standard protocol following bolus  administration of intravenous contrast. RADIATION DOSE REDUCTION: This exam was performed according to the departmental dose-optimization program which includes automated exposure control, adjustment of the mA and/or kV according to patient size and/or use of iterative reconstruction technique. CONTRAST:  OMNIPAQUE IOHEXOL 350 MG/ML SOLN COMPARISON:  Ultrasound pelvis 07/22/2023, CT abdomen pelvis 07/22/2023, pet ct 07/06/23 FINDINGS: Lower chest: Please see separately dictated CT angiography chest 08/20/2023 Hepatobiliary: No focal liver abnormality. Multiple calcified gallstones within the gallbladder lumen. Question gallbladder wall thickening and pericholecystic fluid. No biliary dilatation. Pancreas: No focal lesion. Normal pancreatic contour. No surrounding inflammatory changes. No main pancreatic ductal dilatation. Spleen: Normal in size without focal abnormality. Adrenals/Urinary Tract: No adrenal nodule bilaterally. Bilateral kidneys enhance symmetrically. No hydronephrosis. No hydroureter. The urinary bladder is unremarkable. Stomach/Bowel: Stomach is within normal limits. No evidence of bowel wall thickening or dilatation. Appendix appears normal. Vascular/Lymphatic: Asymmetrically hypodense left femoral vein and external  iliac vein compared to the right with interval increase in size compared to prior. No abdominal aorta or iliac aneurysm. Severe atherosclerotic plaque of the aorta and its branches. Persistent prominent but nonenlarged retroperitoneal and mesenteric lymph nodes. No abdominal, pelvic, or inguinal lymphadenopathy. Reproductive: Uterine fibroid (8:83). Right adnexa is unremarkable. Similar-appearing heterogeneous hypodense 5.4 x 4.6 cm left adnexal/ovarian mass. Other: Trace volume rectouterine pouch free fluid. No intraperitoneal free gas. No organized fluid collection. Musculoskeletal: No abdominal wall hernia or abnormality. Redemonstrate of diffuse axial and appendicular sclerotic  metastases. No acute displaced fracture. Multilevel degenerative changes of the spine. IMPRESSION: 1. Cholelithiasis with question gallbladder wall thickening and pericholecystic fluid. Recommend right upper quadrant ultrasound for further evaluation. 2. Asymmetrically hypodense left femoral vein and external iliac vein compared to the right with interval increase in size compared to prior. Query underlying thrombus. Consider ultrasound if clinically indicated. 3. Similar-appearing heterogeneous hypodense 5.4 x 4.6 cm left adnexal/ovarian mass. 4. Trace volume rectouterine pouch free fluid. 5. Persistent diffuse axial and appendicular sclerotic metastases. 6. Uterine fibroid. 7. Please see separately dictated CT angiography chest 08/20/2023. Electronically Signed   By: Tish Frederickson M.D.   On: 08/20/2023 21:42   DG Chest 2 View  Result Date: 08/20/2023 CLINICAL DATA:  Chest pain. EXAM: CHEST - 2 VIEW COMPARISON:  07/22/2023. FINDINGS: Bilateral lung fields are clear. Bilateral costophrenic angles are clear. Normal cardio-mediastinal silhouette. No acute osseous abnormalities. Redemonstration of extensive sclerotic metastases throughout the imaged bones. No pathological fracture seen. The soft tissues are within normal limits. Right-sided CT Port-A-Cath is seen with its tip overlying the cavoatrial junction region. IMPRESSION: 1. No active cardiopulmonary disease. 2. Diffuse osseous metastases. Electronically Signed   By: Jules Schick M.D.   On: 08/20/2023 15:27    EKG: Independently reviewed.  Sinus tachycardia, no acute ST changes.  Assessment/Plan Principal Problem:   Cholecystitis Active Problems:   Acute cholecystitis   Multifocal pneumonia  (please populate well all problems here in Problem List. (For example, if patient is on BP meds at home and you resume or decide to hold them, it is a problem that needs to be her. Same for CAD, COPD, HLD and so on)  Acute cholecystitis -As per  recommendation by general surgery, will continue current conservative management -Start antibiotic Unasyn to cover both cholecystitis and possible aspiration pneumonia -Symptomatic management with Zofran and narcotic for pain and nauseous -Surgical to talk to patient regarding Cholecystostomy  Possible aspiration pneumonia -With poorly controlled GERD and rash on the back of the throat, suspect silent aspiration. -Unasyn -Change as needed PPI to PPI daily -Other DDx, normal echo earlier this year, and patient clinically appears to be euvolemic, no crackles on physical exam and no peripheral edema, low suspicion for CHF.  Sinus tachycardia -Asymptomatic, PE ruled out.  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 Consults called: General Surgeon Admission status: MedSurg admission   Emeline General MD [facility] Pager [phone]  08/21/2023, 10:28 AM

## 2023-08-22 ENCOUNTER — Telehealth: Payer: 59 | Admitting: Hospice and Palliative Medicine

## 2023-08-22 ENCOUNTER — Encounter: Payer: Self-pay | Admitting: Internal Medicine

## 2023-08-22 DIAGNOSIS — K819 Cholecystitis, unspecified: Secondary | ICD-10-CM | POA: Diagnosis not present

## 2023-08-22 LAB — CBC
HCT: 21.7 % — ABNORMAL LOW (ref 36.0–46.0)
Hemoglobin: 7.2 g/dL — ABNORMAL LOW (ref 12.0–15.0)
MCH: 31.3 pg (ref 26.0–34.0)
MCHC: 33.2 g/dL (ref 30.0–36.0)
MCV: 94.3 fL (ref 80.0–100.0)
Platelets: 133 10*3/uL — ABNORMAL LOW (ref 150–400)
RBC: 2.3 MIL/uL — ABNORMAL LOW (ref 3.87–5.11)
RDW: 17 % — ABNORMAL HIGH (ref 11.5–15.5)
WBC: 4.1 10*3/uL (ref 4.0–10.5)
nRBC: 0.5 % — ABNORMAL HIGH (ref 0.0–0.2)

## 2023-08-22 LAB — BASIC METABOLIC PANEL
Anion gap: 6 (ref 5–15)
BUN: 12 mg/dL (ref 6–20)
CO2: 20 mmol/L — ABNORMAL LOW (ref 22–32)
Calcium: 8 mg/dL — ABNORMAL LOW (ref 8.9–10.3)
Chloride: 101 mmol/L (ref 98–111)
Creatinine, Ser: 0.49 mg/dL (ref 0.44–1.00)
GFR, Estimated: >60 mL/min (ref >60)
Glucose, Bld: 96 mg/dL (ref 70–99)
Potassium: 3.7 mmol/L (ref 3.5–5.1)
Sodium: 127 mmol/L — ABNORMAL LOW (ref 135–145)

## 2023-08-22 MED ORDER — CHLORHEXIDINE GLUCONATE CLOTH 2 % EX PADS: 6.0000 | MEDICATED_PAD | Freq: Every day | CUTANEOUS | Status: DC

## 2023-08-22 NOTE — TOC Progression Note (Signed)
Transition of Care [facility]) - Progression Note    Patient Details  Name: Sheryl Silva MRN: [id number] Date of Birth: 1976/10/07  Transition of Care [facility]) CM/SW Contact  Garret Reddish, RN Phone Number: 08/22/2023, 10:04 PM  Clinical Narrative:            Expected Discharge Plan and Services                                               Social Determinants of Health (SDOH) Interventions SDOH Screenings   Food Insecurity: No Food Insecurity (08/22/2023)  Recent Concern: Food Insecurity - Food Insecurity Present (07/22/2023)  Housing: Low Risk  (08/22/2023)  Transportation Needs: No Transportation Needs (08/22/2023)  Recent Concern: Transportation Needs - Unmet Transportation Needs (07/22/2023)  Utilities: Not At Risk (08/22/2023)  Alcohol Screen: Low Risk  (02/15/2023)  Depression (PHQ2-9): Low Risk  (02/14/2023)  Financial Resource Strain: Low Risk  (05/09/2023)   Received from [facility], [facility]  Recent Concern: Financial Resource Strain - Medium Risk (02/15/2023)  Physical Activity: Inactive (02/15/2023)  Social Connections: Socially Isolated (02/15/2023)  Stress: Stress Concern Present (02/15/2023)  Tobacco Use: Medium Risk (08/22/2023)    Readmission Risk Interventions    08/22/2023   10:03 PM 08/04/2023    8:47 AM 07/28/2023    8:49 AM  Readmission Risk Prevention Plan  Transportation Screening Complete  Complete  PCP or Specialist Appt within 3-5 Days Complete Complete   HRI or Home Care Consult Complete  --  Social Work Consult for Recovery Care Planning/Counseling --  Complete  Palliative Care Screening Not Applicable    Medication Review [employer]) Complete  Complete

## 2023-08-22 NOTE — Plan of Care (Signed)

## 2023-08-22 NOTE — Progress Notes (Signed)
Transition of Care [facility]) - Inpatient Brief Assessment   Patient Details  Name: Dayzee Marler MRN: [id number] Date of Birth: June 08, 1976  Transition of Care [facility]) CM/SW Contact:    Garret Reddish, RN Phone Number: 08/22/2023, 9:56 PM   Clinical Narrative: Chart reviewed.  Noted that patient was admitted with Cholecystitis. Surgery following.  No surgical intervention at this time. Patient is   On IV Unasyn.  Patient is tolerating diet at this time.    No identified TOC needs.    Transition of Care Asessment: Insurance and Status: Insurance coverage has been reviewed Patient has primary care physician: Yes (Marcello Fennel, Vishwanath, MD) Home environment has been reviewed: Review Prior level of function:: Independent Prior/Current Home Services: No current home services Social Determinants of Health Reivew: SDOH reviewed no interventions necessary Readmission risk has been reviewed: Yes Transition of care needs: no transition of care needs at this time

## 2023-08-22 NOTE — Progress Notes (Signed)
Patient ID: Sheryl Silva, female   DOB: Oct 04, 1976, 47 y.o.   MRN: [id number]     SURGICAL PROGRESS NOTE   Hospital Day(s): 1.   Interval History: Patient seen and examined, no acute events or new complaints overnight. Patient reports feeling much better today.  Patient endorses no abdominal pain.  She is having some back pain from laying down all day in bed.  Patient was found eating regular diet today.  No abdominal pain.  No nausea or vomiting.  No fever.  Vital signs in last 24 hours: [min-max] current  Temp:  [98 F (36.7 C)-98.9 F (37.2 C)] 98 F (36.7 C) (10/21 0400) Pulse Rate:  [74-108] 108 (10/21 0721) Resp:  [16-20] 16 (10/21 0721) BP: (124-158)/(77-100) 158/100 (10/21 0721) SpO2:  [94 %-100 %] 100 % (10/21 0721)     Height: 5\' 6"  (167.6 cm) Weight: 73 kg BMI (Calculated): 25.99   Physical Exam:  Constitutional: alert, cooperative and no distress  Respiratory: breathing non-labored at rest  Cardiovascular: regular rate and sinus rhythm  Gastrointestinal: soft, non-tender, and non-distended  Labs:     Latest Ref Rng & Units 08/22/2023    6:13 AM 08/20/2023    2:16 PM 08/15/2023    8:39 AM  CBC  WBC 4.0 - 10.5 K/uL 4.1  4.5  6.0   Hemoglobin 12.0 - 15.0 g/dL 7.2  9.4  9.1   Hematocrit 36.0 - 46.0 % 21.7  28.9  28.2   Platelets 150 - 400 K/uL 133  181  112       Latest Ref Rng & Units 08/22/2023    6:13 AM 08/20/2023    4:19 PM 08/20/2023    2:16 PM  CMP  Glucose 70 - 99 mg/dL 96   811   BUN 6 - 20 mg/dL 12   13   Creatinine 9.14 - 1.00 mg/dL 7.82   9.56   Sodium 213 - 145 mmol/L 127   131   Potassium 3.5 - 5.1 mmol/L 3.7   3.8   Chloride 98 - 111 mmol/L 101   97   CO2 22 - 32 mmol/L 20   23   Calcium 8.9 - 10.3 mg/dL 8.0   8.3   Total Protein 6.5 - 8.1 g/dL  6.5    Total Bilirubin 0.3 - 1.2 mg/dL  0.7    Alkaline Phos 38 - 126 U/L  332    AST 15 - 41 U/L  69    ALT 0 - 44 U/L  43      Imaging studies: No new pertinent imaging  studies   Assessment/Plan:  47 y.o. 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   -From the acute cholecystitis standpoint patient seems to be doing great.  She has no abdominal pain today.  She is tolerating regular diet -I agree with transitioning to oral antibiotic therapy once evaluated by infectious disease since patient is also in treatment for colitis. -Continue current management -Possible discharge in the next 24 hours this patient tolerating diet and is able to be transition to oral antibiotic therapy.  Gae Gallop, MD

## 2023-08-22 NOTE — Progress Notes (Signed)
Progress Note   Patient: Sheryl Silva WUJ:[id number] DOB: February 19, 1976 DOA: 08/20/2023     1 DOS: the patient was seen and examined on 08/22/2023   Brief hospital course: From admission 08/21/2023 --  " Sheryl Silva is a 47 y.o. 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 " See H&P for full HPI on admission and ED course.  Patient was found to have acute cholecystitis. Admitted to medicine service with general surgery consulted.  Patient was started on IV antibiotics and symptomatic management.   10/21 -- pt doing well clinically.  Advanced diet to regular and pt tolerating well so far.  Pt denying abdominal pain.  Assessment and Plan:  Acute cholecystitis Being conservatively managed. Pt clinically improved and free of abdominal pain or N/V today --Mgmt per General Surgery --On IV Unasyn --Likely d/c on Augmentin --PRN anti-emetics and pain control --No surgery or perc cholecystostomy indicated at this point   Possible aspiration pneumonia Given poorly controlled GERD and rash on the back of the throat seen on admission, concern silent aspiration. --On Unasyn --Home PRN PPI changed to daily   Sinus tachycardia -Asymptomatic, PE ruled out.  Appears to be chronic -TSH normal    Recently diagnosed CMV colitis -Continue will Valganciclovir for total 4 weeks -Follow up with ID is tomorrow.  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 Orlie Dakin re pt admitted      Subjective: Pt seen with family today.  Surgeon in room.  Pt reports pain resolved.  No N/V or F/C.  Eating regular diet for lunch and tolerating so far very well.  She denies other acute complaints.   Physical Exam: Vitals:   08/22/23 0400 08/22/23 0415 08/22/23 0721 08/22/23 1629  BP: 124/84 124/84 (!) 158/100 124/78  Pulse: 80  (!) 108   Resp: 18  16 (!) 22  Temp: 98 F (36.7 C)   98.5 F (36.9 C)  TempSrc: Oral     SpO2: 96%  100%   Weight:      Height:      General exam: awake, alert, no acute distress HEENT: atraumatic, clear conjunctiva, anicteric sclera, moist mucus membranes, hearing grossly normal  Respiratory system: CTAB, no wheezes, rales or rhonchi, normal respiratory effort. Cardiovascular system: normal S1/S2, RRR, port anterior right upper chest wall, no pedal edema.   Gastrointestinal system: soft, NT, ND Central nervous system: A&O x 4. no gross focal neurologic deficits, normal speech Extremities: moves all, no edema, normal tone Skin: dry, intact, normal temperature Psychiatry: normal mood, congruent affect, judgement and insight appear normal   Data Reviewed:  Notable labs --  Na 127, bicarb 20, Ca 8.0, Hbg 7.2, plateles 133k  Family Communication: At bedside on rounds  Disposition: Status is: Inpatient Remains inpatient appropriate because: on IV antibiotics, closely monitoring acute cholecystitis    Planned Discharge Destination: Home    Time spent: 42 minutes  Author: Pennie Banter, DO 08/22/2023 5:02 PM  For on call review [email or web address].

## 2023-08-23 ENCOUNTER — Inpatient Hospital Stay: Payer: 59 | Admitting: Infectious Diseases

## 2023-08-23 ENCOUNTER — Other Ambulatory Visit: Payer: Self-pay

## 2023-08-23 DIAGNOSIS — K819 Cholecystitis, unspecified: Secondary | ICD-10-CM | POA: Diagnosis not present

## 2023-08-23 LAB — COMPREHENSIVE METABOLIC PANEL
ALT: 26 U/L (ref 0–44)
AST: 40 U/L (ref 15–41)
Albumin: 2.5 g/dL — ABNORMAL LOW (ref 3.5–5.0)
Alkaline Phosphatase: 424 U/L — ABNORMAL HIGH (ref 38–126)
Anion gap: 7 (ref 5–15)
BUN: 9 mg/dL (ref 6–20)
CO2: 22 mmol/L (ref 22–32)
Calcium: 8 mg/dL — ABNORMAL LOW (ref 8.9–10.3)
Chloride: 101 mmol/L (ref 98–111)
Creatinine, Ser: 0.43 mg/dL — ABNORMAL LOW (ref 0.44–1.00)
GFR, Estimated: >60 mL/min (ref >60)
Glucose, Bld: 94 mg/dL (ref 70–99)
Potassium: 3.6 mmol/L (ref 3.5–5.1)
Sodium: 130 mmol/L — ABNORMAL LOW (ref 135–145)
Total Bilirubin: 0.9 mg/dL (ref 0.3–1.2)
Total Protein: 6 g/dL — ABNORMAL LOW (ref 6.5–8.1)

## 2023-08-23 LAB — CBC
HCT: 20.9 % — ABNORMAL LOW (ref 36.0–46.0)
Hemoglobin: 6.9 g/dL — ABNORMAL LOW (ref 12.0–15.0)
MCH: 30.8 pg (ref 26.0–34.0)
MCHC: 33 g/dL (ref 30.0–36.0)
MCV: 93.3 fL (ref 80.0–100.0)
Platelets: 143 10*3/uL — ABNORMAL LOW (ref 150–400)
RBC: 2.24 MIL/uL — ABNORMAL LOW (ref 3.87–5.11)
RDW: 17 % — ABNORMAL HIGH (ref 11.5–15.5)
WBC: 3.7 10*3/uL — ABNORMAL LOW (ref 4.0–10.5)
nRBC: 0 % (ref 0.0–0.2)

## 2023-08-23 LAB — PREPARE RBC (CROSSMATCH)

## 2023-08-23 MED ORDER — SODIUM CHLORIDE 0.9% IV SOLUTION: Freq: Once | INTRAVENOUS | Status: DC

## 2023-08-23 MED ORDER — AMOXICILLIN-POT CLAVULANATE 875-125 MG PO TABS
1.0000 | ORAL_TABLET | Freq: Two times a day (BID) | ORAL | 0 refills | Status: AC
  Filled 2023-08-23: qty 24, 12d supply, fill #0

## 2023-08-23 NOTE — Discharge Summary (Signed)
Physician Discharge Summary   Patient: Sheryl Silva MRN: [id number] DOB: 1976-03-20  Admit date:     08/20/2023  Discharge date: 08/23/2023  Discharge Physician: Pennie Banter   PCP: [name], MD   Recommendations at discharge:    Follow up with General Surgery Follow up with Oncology Follow up with Primary Care Repeat CBC, CMP at follow up   Discharge Diagnoses: Principal Problem:   Cholecystitis Active Problems:   Acute cholecystitis   Multifocal pneumonia  Resolved Problems:   * No resolved hospital problems. [facility] Course:  From admission 08/21/2023 --  " Sheryl Silva is a 47 y.o. 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 " See H&P for full HPI on admission and ED course.   Patient was found to have acute cholecystitis. Admitted to medicine service with general surgery consulted.  Patient was started on IV antibiotics and symptomatic management.    10/21 -- pt doing well clinically.  Advanced diet to regular and pt tolerating well so far.  Pt denying abdominal pain.   10/22 -- pt continues to do well. Tolerating diet.  No significant abdominal pain or N/V.  Medically stable for discharge home today.  She has follow up next week with Dr. Orlie Dakin.     Assessment and Plan:  Acute cholecystitis Being conservatively managed. Pt clinically improved and free of abdominal pain or N/V today --Mgmt per General Surgery --Treated with IV Unasyn >> discharge on PO Augmentin to complete 14 day course --PRN anti-emetics and pain control --No surgery or perc cholecystostomy indicated at this point --Outpatient follow up with general surgery as needed / as scheduled   Possible aspiration pneumonia Given poorly controlled GERD  and rash on the back of the throat seen on admission, concern silent aspiration. --IV Unasyn >> Augmentin as above, will cover --Home PRN PPI changed to daily   Sinus tachycardia -Asymptomatic, PE ruled out.  Appears to be chronic -TSH normal    Recently diagnosed CMV colitis -Continue will Valganciclovir for total 4 weeks -Follow up with ID is tomorrow.  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 Orlie Dakin re pt admitted         Consultants: General surgery Procedures performed: none  Disposition: Home Diet recommendation:  Discharge Diet Orders (From admission, onward)     Start     Ordered   08/23/23 0000  Diet - low sodium heart healthy        08/23/23 1414            DISCHARGE MEDICATION: Allergies as of 08/23/2023       Reactions   Nifedipine Palpitations        Medication List     STOP taking these medications    dexamethasone 2 MG tablet Commonly known as: DECADRON       TAKE these medications    albuterol 108 (90 Base) MCG/ACT inhaler Commonly known as: VENTOLIN HFA Inhale 2 puffs into the lungs every 6 (six) hours as needed for wheezing or shortness of breath.   amLODipine 10 MG tablet Commonly known as: NORVASC Take  10 mg by mouth daily.   amoxicillin-clavulanate 875-125 MG tablet Commonly known as: AUGMENTIN Take 1 tablet by mouth 2 (two) times daily for 12 days.   cyanocobalamin 500 MCG tablet Commonly known as: VITAMIN B12 Take 500 mcg by mouth daily.   cyclobenzaprine 10 MG tablet Commonly known as: FLEXERIL Take 10 mg by mouth at bedtime as needed for muscle spasms.   Doxepin HCl 6 MG Tabs Take 6 mg by mouth at bedtime.   DULoxetine 60 MG capsule Commonly known as:  CYMBALTA Take 60 mg by mouth daily.   feeding supplement Liqd Take 237 mLs by mouth 3 (three) times daily between meals.   hydrocortisone 2.5 % cream Apply 1 Application topically 2 (two) times daily as needed (skin irritation).   lidocaine-prilocaine cream Commonly known as: EMLA Apply to affected area once   megestrol 40 MG tablet Commonly known as: MEGACE TAKE 1 TABLET BY MOUTH EVERY DAY   multivitamin tablet Take 1 tablet by mouth daily.   naloxone 4 MG/0.1ML Liqd nasal spray kit Commonly known as: NARCAN Place 1 spray into the nose once.   ondansetron 8 MG tablet Commonly known as: Zofran Take 1 tablet (8 mg total) by mouth every 8 (eight) hours as needed for nausea or vomiting. Start on the third day after chemotherapy.   oxyCODONE-acetaminophen 10-325 MG tablet Commonly known as: PERCOCET Take 1 tablet by mouth every 6 (six) hours as needed for pain.   polyethylene glycol 17 g packet Commonly known as: MIRALAX / GLYCOLAX Take 17 g by mouth daily as needed.   prochlorperazine 10 MG tablet Commonly known as: COMPAZINE Take 1 tablet (10 mg total) by mouth every 6 (six) hours as needed for nausea or vomiting.   traMADol 300 MG 24 hr tablet Commonly known as: ULTRAM-ER Take 300 mg by mouth daily.   valGANciclovir 450 MG tablet Commonly known as: Valcyte Take 2 tablets (900 mg total) by mouth 2 (two) times daily for 28 days.   Vitamin D (Ergocalciferol) 1.25 MG (50000 UNIT) Caps capsule Commonly known as: DRISDOL Take 50,000 Units by mouth every 7 (seven) days.        Follow-up Information     [name], MD Follow up.   Specialty: Internal Medicine Why: Hospital follow up Contact information: [address] [place] [address] [phone]         [name], MD Follow up.   Specialty: General Surgery Why: F/u s/p cholecysitis Contact information: [address] [place]  [address] [phone]                Discharge Exam: Filed Weights   08/20/23 1413  Weight: 73 kg   General exam: awake, alert, no acute distress HEENT: atraumatic, clear conjunctiva, anicteric sclera, moist mucus membranes, hearing grossly normal  Respiratory system: CTA, no wheezes, rales or rhonchi, normal respiratory effort. Cardiovascular system: normal S1/S2, RRR, no pedal edema.  Gastrointestinal system: soft, NT, ND, no HSM felt, +bowel sounds. Central nervous system: A&O x 3. no gross focal neurologic deficits, normal speech Extremities: moves all, no edema, normal tone Skin: dry, intact, normal temperature, normal color, No rashes, lesions or ulcers Psychiatry: normal mood, congruent affect, judgement and insight appear normal'   Condition at discharge: stable  The results of significant diagnostics from this hospitalization (including imaging, microbiology, ancillary and laboratory) are listed below for reference.   Imaging Studies: MR ABDOMEN MRCP W WO CONTAST  Result Date: 08/21/2023 CLINICAL  DATA:  47 year old female with history of transaminitis and abdominal pain. Gallstones. Evaluate for choledocholithiasis. EXAM: MRI ABDOMEN WITHOUT AND WITH CONTRAST (INCLUDING MRCP) TECHNIQUE: Multiplanar multisequence MR imaging of the abdomen was performed both before and after the administration of intravenous contrast. Heavily T2-weighted images of the biliary and pancreatic ducts were obtained, and three-dimensional MRCP images were rendered by post processing. CONTRAST:  7mL GADAVIST GADOBUTROL 1 MMOL/ML IV SOLN COMPARISON:  No prior abdominal MRI. CT of the abdomen and pelvis 08/20/2023. FINDINGS: Lower chest: Elevation of the right hemidiaphragm. Multiple prominent borderline enlarged juxta diaphragmatic lymph nodes are noted on the right measuring up to 8 mm in short axis (axial image 21 of series 26). Hepatobiliary: Multiple filling defects are noted within the  gallbladder, most notably a 1.9 cm filling defect near the neck of the gallbladder, indicative of gallstones. Gallbladder is severely distended. Gallbladder wall appears thickened and edematous with trace volume of pericholecystic fluid. MRCP images are limited by patient respiratory motion. With this limitation in mind, no definite filling defect within the common bile duct to suggest choledocholithiasis. Common bile duct is dilated measuring up to 11 mm. There is abrupt tapering of the distal common bile duct shortly above the level of the ampulla, suggesting a benign stricture. No intrahepatic biliary ductal dilatation noted at this time. Mild increased T2 signal intensity in a periportal distribution, indicative of periportal edema. No suspicious cystic or solid hepatic lesions. Pancreas: No pancreatic mass. No pancreatic ductal dilatation. No pancreatic or peripancreatic fluid collections or inflammatory changes. Spleen:  Unremarkable. Adrenals/Urinary Tract: Bilateral kidneys and adrenal glands are normal in appearance. No hydroureteronephrosis in the visualized portions of the abdomen. Stomach/Bowel: Visualized portions are unremarkable. Vascular/Lymphatic: Atherosclerosis of the abdominal aorta. No aneurysm identified in the visualized abdominal vasculature. No lymphadenopathy noted in the abdomen. Other: No significant volume of ascites noted in the visualized portions of the peritoneal cavity. Musculoskeletal: Innumerable small areas of heterogeneous enhancement noted throughout the visualized axial skeleton, indicative of widespread metastatic disease to the bones. Multiple prominent borderline enlarged enhancing lymph nodes in the lower left axial region partially imaged. IMPRESSION: 1. Cholelithiasis with evidence of acute cholecystitis. Surgical consultation is recommended. 2. No choledocholithiasis. 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 This is associated  with common bile duct dilatation, but no frank intrahepatic biliary ductal dilatation is noted at this time. 3. Diffuse periportal edema in the liver. 4. Borderline enlarged juxta diaphragmatic lymph nodes and left axillary lymph nodes, likely metastatic. Widespread metastatic disease to the bones redemonstrated. Electronically Signed   By: Trudie Reed M.D.   On: 08/21/2023 07:41   MR 3D Recon At Scanner  Result Date: 08/21/2023 CLINICAL DATA:  47 year old female with history of transaminitis and abdominal pain. Gallstones. Evaluate for choledocholithiasis. EXAM: MRI ABDOMEN WITHOUT AND WITH CONTRAST (INCLUDING MRCP) TECHNIQUE: Multiplanar multisequence MR imaging of the abdomen was performed both before and after the administration of intravenous contrast. Heavily T2-weighted images of the biliary and pancreatic ducts were obtained, and three-dimensional MRCP images were rendered by post processing. CONTRAST:  7mL GADAVIST GADOBUTROL 1 MMOL/ML IV SOLN COMPARISON:  No prior abdominal MRI. CT of the abdomen and pelvis 08/20/2023. FINDINGS: Lower chest: Elevation of the right hemidiaphragm. Multiple prominent borderline enlarged juxta diaphragmatic lymph nodes are noted on the right measuring up to 8 mm in short axis (axial image 21 of series 26). Hepatobiliary: Multiple filling defects are noted within the gallbladder, most notably a  1.9 cm filling defect near the neck of the gallbladder, indicative of gallstones. Gallbladder is severely distended. Gallbladder wall appears thickened and edematous with trace volume of pericholecystic fluid. MRCP images are limited by patient respiratory motion. With this limitation in mind, no definite filling defect within the common bile duct to suggest choledocholithiasis. Common bile duct is dilated measuring up to 11 mm. There is abrupt tapering of the distal common bile duct shortly above the level of the ampulla, suggesting a benign stricture. No intrahepatic biliary  ductal dilatation noted at this time. Mild increased T2 signal intensity in a periportal distribution, indicative of periportal edema. No suspicious cystic or solid hepatic lesions. Pancreas: No pancreatic mass. No pancreatic ductal dilatation. No pancreatic or peripancreatic fluid collections or inflammatory changes. Spleen:  Unremarkable. Adrenals/Urinary Tract: Bilateral kidneys and adrenal glands are normal in appearance. No hydroureteronephrosis in the visualized portions of the abdomen. Stomach/Bowel: Visualized portions are unremarkable. Vascular/Lymphatic: Atherosclerosis of the abdominal aorta. No aneurysm identified in the visualized abdominal vasculature. No lymphadenopathy noted in the abdomen. Other: No significant volume of ascites noted in the visualized portions of the peritoneal cavity. Musculoskeletal: Innumerable small areas of heterogeneous enhancement noted throughout the visualized axial skeleton, indicative of widespread metastatic disease to the bones. Multiple prominent borderline enlarged enhancing lymph nodes in the lower left axial region partially imaged. IMPRESSION: 1. Cholelithiasis with evidence of acute cholecystitis. Surgical consultation is recommended. 2. No choledocholithiasis. 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 This is associated with common bile duct dilatation, but no frank intrahepatic biliary ductal dilatation is noted at this time. 3. Diffuse periportal edema in the liver. 4. Borderline enlarged juxta diaphragmatic lymph nodes and left axillary lymph nodes, likely metastatic. Widespread metastatic disease to the bones redemonstrated. Electronically Signed   By: Trudie Reed M.D.   On: 08/21/2023 07:41   US Venous Img Lower Unilateral Left  Result Date: 08/21/2023 CLINICAL DATA:  Left lower extremity pain.  Abnormal CT. EXAM: LEFT LOWER EXTREMITY VENOUS DOPPLER ULTRASOUND TECHNIQUE: Gray-scale sonography with  compression, as well as color and duplex ultrasound, were performed to evaluate the deep venous system(s) from the level of the common femoral vein through the popliteal and proximal calf veins. COMPARISON:  None Available. FINDINGS: VENOUS Normal compressibility of the common femoral, superficial femoral, and popliteal veins, as well as the visualized calf veins. Visualized portions of profunda femoral vein and great saphenous vein unremarkable. No filling defects to suggest DVT on grayscale or color Doppler imaging. Doppler waveforms show normal direction of venous flow, normal respiratory plasticity and response to augmentation. Limited views of the contralateral common femoral vein are unremarkable. OTHER None. Limitations: none IMPRESSION: No evidence of left lower extremity DVT. Electronically Signed   By: Narda Rutherford M.D.   On: 08/21/2023 00:08   US ABDOMEN LIMITED RUQ (LIVER/GB)  Result Date: 08/21/2023 CLINICAL DATA:  Abdominal pain. EXAM: ULTRASOUND ABDOMEN LIMITED RIGHT UPPER QUADRANT COMPARISON:  CT earlier today FINDINGS: Gallbladder: Moderately distended containing multiple intraluminal stones and sludge. Edematous gallbladder wall thickening at 5 mm. No sonographic Murphy sign noted by sonographer. Common bile duct: Diameter: 9 mm.  No visualized choledocholithiasis. Liver: There is central intrahepatic biliary ductal dilatation. No focal lesion identified. Within normal limits in parenchymal echogenicity. Portal vein is patent on color Doppler imaging with normal direction of blood flow towards the liver. Other: No definite pericholecystic fluid by ultrasound. IMPRESSION: 1. Sonographic findings suspicious for acute cholecystitis. Gallstones and  sludge with edematous gallbladder wall thickening. 2. Dilated common bile duct with intrahepatic biliary ductal dilatation. No visualized choledocholithiasis. MRCP could be performed for further assessment of the biliary tree as clinically  indicated. Electronically Signed   By: Narda Rutherford M.D.   On: 08/21/2023 00:07   CT Angio Chest PE W and/or Wo Contrast  Result Date: 08/20/2023 CLINICAL DATA:  Pulmonary embolus suspected with high probability. Chest pain in the cancer patient. EXAM: CT ANGIOGRAPHY CHEST WITH CONTRAST TECHNIQUE: Multidetector CT imaging of the chest was performed using the standard protocol during bolus administration of intravenous contrast. Multiplanar CT image reconstructions and MIPs were obtained to evaluate the vascular anatomy. RADIATION DOSE REDUCTION: This exam was performed according to the departmental dose-optimization program which includes automated exposure control, adjustment of the mA and/or kV according to patient size and/or use of iterative reconstruction technique. CONTRAST:  OMNIPAQUE IOHEXOL 350 MG/ML SOLN COMPARISON:  CT chest 02/03/2023.  PET-CT 07/06/2023 FINDINGS: Cardiovascular: Technically adequate study with good opacification of the central and segmental pulmonary arteries and mild motion artifact. No focal filling defects are demonstrated. No evidence of significant pulmonary embolus. Mild cardiac enlargement. No pericardial effusions. Normal caliber thoracic aorta. No aortic dissection. 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 Esophagus is decompressed. Thyroid gland is unremarkable. Prominent lymphadenopathy in the left axilla and left supraclavicular region. Left axillary lymph nodes measure up to about 2.3 cm short axis dimension. Lymph nodes are similar in size to prior PET-CT. No significant mediastinal lymphadenopathy. Lungs/Pleura: Motion artifact limits examination. There is patchy airspace disease throughout both lungs. This could indicate multifocal pneumonia or edema. No pleural effusions. No pneumothorax. Upper Abdomen: Enlarged lymph nodes in the cardiophrenic angle. No acute abnormalities demonstrated in the visualized upper  abdomen. 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 Review of the MIP images confirms the above findings. IMPRESSION: 1. No evidence of significant pulmonary embolus. 2. Patchy airspace disease throughout both lungs likely representing edema or multifocal pneumonia. 3. Metastatic lymphadenopathy in the left axilla and supraclavicular region. 4. Diffuse sclerotic skeletal metastasis. Electronically Signed   By: Burman Nieves M.D.   On: 08/20/2023 21:42   CT ABDOMEN PELVIS W CONTRAST  Result Date: 08/20/2023 CLINICAL DATA:  Abdominal pain, acute, nonlocalized. Current cancer patient. Pain in abdomen. EXAM: CT ABDOMEN AND PELVIS WITH CONTRAST TECHNIQUE: Multidetector CT imaging of the abdomen and pelvis was performed using the standard protocol following bolus administration of intravenous contrast. RADIATION DOSE REDUCTION: This exam was performed according to the departmental dose-optimization program which includes automated exposure control, adjustment of the mA and/or kV according to patient size and/or use of iterative reconstruction technique. CONTRAST:  OMNIPAQUE IOHEXOL 350 MG/ML SOLN COMPARISON:  Ultrasound pelvis 07/22/2023, CT abdomen pelvis 07/22/2023, pet ct 07/06/23 FINDINGS: Lower chest: Please see separately dictated CT angiography chest 08/20/2023 Hepatobiliary: No focal liver abnormality. Multiple calcified gallstones within the gallbladder lumen. Question gallbladder wall thickening and pericholecystic fluid. No biliary dilatation. Pancreas: No focal lesion. Normal pancreatic contour. No surrounding inflammatory changes. No main pancreatic ductal dilatation. Spleen: Normal in size without focal abnormality. Adrenals/Urinary Tract: No adrenal nodule bilaterally. Bilateral kidneys enhance symmetrically. No hydronephrosis. No hydroureter. The urinary bladder is unremarkable. Stomach/Bowel: Stomach is within  normal limits. No evidence of bowel wall thickening or dilatation. Appendix appears normal. Vascular/Lymphatic: Asymmetrically hypodense left femoral vein and external iliac vein compared to the right with interval increase in size compared to prior.  No abdominal aorta or iliac aneurysm. Severe atherosclerotic plaque of the aorta and its branches. Persistent prominent but nonenlarged retroperitoneal and mesenteric lymph nodes. No abdominal, pelvic, or inguinal lymphadenopathy. Reproductive: Uterine fibroid (8:83). Right adnexa is unremarkable. Similar-appearing heterogeneous hypodense 5.4 x 4.6 cm left adnexal/ovarian mass. Other: Trace volume rectouterine pouch free fluid. No intraperitoneal free gas. No organized fluid collection. Musculoskeletal: No abdominal wall hernia or abnormality. Redemonstrate of diffuse axial and appendicular sclerotic metastases. No acute displaced fracture. Multilevel degenerative changes of the spine. IMPRESSION: 1. Cholelithiasis with question gallbladder wall thickening and pericholecystic fluid. Recommend right upper quadrant ultrasound for further evaluation. 2. Asymmetrically hypodense left femoral vein and external iliac vein compared to the right with interval increase in size compared to prior. Query underlying thrombus. Consider ultrasound if clinically indicated. 3. Similar-appearing heterogeneous hypodense 5.4 x 4.6 cm left adnexal/ovarian mass. 4. Trace volume rectouterine pouch free fluid. 5. Persistent diffuse axial and appendicular sclerotic metastases. 6. Uterine fibroid. 7. Please see separately dictated CT angiography chest 08/20/2023. Electronically Signed   By: Tish Frederickson M.D.   On: 08/20/2023 21:42   DG Chest 2 View  Result Date: 08/20/2023 CLINICAL DATA:  Chest pain. EXAM: CHEST - 2 VIEW COMPARISON:  07/22/2023. FINDINGS: Bilateral lung fields are clear. Bilateral costophrenic angles are clear. Normal cardio-mediastinal silhouette. No acute osseous  abnormalities. Redemonstration of extensive sclerotic metastases throughout the imaged bones. No pathological fracture seen. The soft tissues are within normal limits. Right-sided CT Port-A-Cath is seen with its tip overlying the cavoatrial junction region. IMPRESSION: 1. No active cardiopulmonary disease. 2. Diffuse osseous metastases. Electronically Signed   By: Jules Schick M.D.   On: 08/20/2023 15:27   MR THORACIC SPINE W WO CONTRAST  Result Date: 07/30/2023 CLINICAL DATA:  Metastatic carcinoma of gastrointestinal primary. EXAM: MRI THORACIC WITHOUT AND WITH CONTRAST TECHNIQUE: Multiplanar and multiecho pulse sequences of the thoracic spine were obtained without and with intravenous contrast. CONTRAST:  7mL GADAVIST GADOBUTROL 1 MMOL/ML IV SOLN COMPARISON:  None Available. FINDINGS: Alignment:  Normal Vertebrae: Widespread metastatic disease throughout the marrow spaces of every vertebral body level with involvement of the vertebral bodies and posterior elements. No pathologic fracture is seen. No evidence of epidural tumor. No evidence of metastatic disease to the cord. No foraminal disease identified in the thoracic region. Cord:  No evidence of metastatic disease to the cord is self. Paraspinal and other soft tissues: Posterior rib involvement is present as well. Disc levels: Disc degeneration and bulging at T7-8. Narrowing of the ventral subarachnoid space but no compression of the cord. IMPRESSION: 1. Widespread metastatic disease throughout the marrow spaces of every vertebral body level with involvement of the vertebral bodies and posterior elements. No pathologic fracture. No evidence of epidural tumor. No evidence of metastatic disease to the cord or neural foramina. 2. Disc degeneration and bulging at T7-8 with narrowing of the ventral subarachnoid space but no compression of the cord. Electronically Signed   By: Paulina Fusi M.D.   On: 07/30/2023 21:16   MR Lumbar Spine W Wo Contrast  Result  Date: 07/30/2023 CLINICAL DATA:  Metastatic adenocarcinoma of gastrointestinal source. EXAM: MRI LUMBAR SPINE WITHOUT AND WITH CONTRAST TECHNIQUE: Multiplanar and multiecho pulse sequences of the lumbar spine were obtained without and with intravenous contrast. CONTRAST:  7mL GADAVIST GADOBUTROL 1 MMOL/ML IV SOLN COMPARISON:  04/17/2022 FINDINGS: Segmentation:  5 lumbar type vertebral bodies. Alignment:  Normal Vertebrae: Metastatic disease throughout the marrow spaces with involvement of every vertebral level and  the sacrum. Iliac bones are also diffusely involved. The spinal involvement includes vertebral body involvement and posterior element involvement. No evidence of a pathologic fracture. Some extraosseous extension of tumor along the left side of the L2 vertebral body. Early tumor involvement of the intervertebral foramina on the left at L1-2 and L2-3. Conus medullaris and cauda equina: Conus extends to the L1 level. No evidence of metastatic disease to the distal cord, conus tip or nerve roots. Paraspinal and other soft tissues: Otherwise negative Disc levels: No significant disc level pathology. IMPRESSION: 1. Widespread osseous metastatic disease throughout the spine, sacrum and iliac bones. No evidence of pathologic fracture. Some extraosseous extension of tumor along the left side of the L2 vertebral body. Early tumor involvement of the intervertebral foramina on the left at L1-2 and L2-3. 2. No evidence of metastatic disease to the distal cord, conus tip or nerve roots. Electronically Signed   By: Paulina Fusi M.D.   On: 07/30/2023 21:13   MR CERVICAL SPINE W WO CONTRAST  Result Date: 07/30/2023 CLINICAL DATA:  Metastatic disease evaluation. 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 EXAM: MRI CERVICAL SPINE WITHOUT AND WITH CONTRAST TECHNIQUE: Multiplanar and multiecho pulse sequences of the cervical spine, to include the craniocervical junction and cervicothoracic junction, were obtained  without and with intravenous contrast. CONTRAST:  7mL GADAVIST GADOBUTROL 1 MMOL/ML IV SOLN COMPARISON:  04/04/2022 FINDINGS: Alignment: Straightening of the normal cervical lordosis. Vertebrae and spinal cord: Metastatic disease throughout the region affecting all of the vertebral bodies, the clivus and the posterior elements. No evidence of extraosseous tumor causing neural encroachment. No metastatic disease to the spinal cord itself. Posterior Fossa, vertebral arteries, paraspinal tissues: Enlarged cervical lymph nodes on the left. Disc levels: Mild bulging of the disc. Involution of a previously seen herniation at C4-5. No compressive stenosis of the canal or foramina due to disc disease. IMPRESSION: 1. Widespread osseous metastatic disease throughout the cervical spine, the clivus and the posterior elements. Involvement of every vertebral level. No evidence of extraosseous tumor causing neural encroachment. No evidence of metastatic disease to the spinal cord itself. 2. Enlarged cervical lymph nodes on the left consistent with metastatic disease. 3. Involution of a previously seen disc herniation at C4-5. No compressive stenosis of the canal or foramina due to degenerative disease. Electronically Signed   By: Paulina Fusi M.D.   On: 07/30/2023 21:09    Microbiology: Results for orders placed or performed during the hospital encounter of 08/20/23  SARS Coronavirus 2 by RT PCR (hospital order, performed in [facility] hospital lab) *cepheid single result test* Anterior Nasal Swab     Status: None   Collection Time: 08/20/23  7:15 PM   Specimen: Anterior Nasal Swab  Result Value Ref Range Status   SARS Coronavirus 2 by RT PCR NEGATIVE NEGATIVE Final    Comment: (NOTE) SARS-CoV-2 target nucleic acids are NOT DETECTED.  The SARS-CoV-2 RNA is generally detectable in upper and lower respiratory specimens during the acute phase of infection. The lowest concentration of SARS-CoV-2 viral copies this assay  can detect is 250 copies / mL. A negative result does not preclude SARS-CoV-2 infection and should not be used as the sole basis for treatment or other patient management decisions.  A negative result may occur with improper specimen collection / handling, submission of specimen other than nasopharyngeal swab, presence of viral mutation(s) within the areas targeted by this assay, and inadequate number of viral copies (<250 copies / mL). A negative  result must be combined with clinical observations, patient history, and epidemiological information.  Fact Sheet for Patients:   RoadLapTop.co.za  Fact Sheet for Healthcare Providers: http://kim-miller.com/  This test is not yet approved or  cleared by the [place] FDA and has been authorized for detection and/or diagnosis of SARS-CoV-2 by FDA under an Emergency Use Authorization (EUA).  This EUA will remain in effect (meaning this test can be used) for the duration of the COVID-19 declaration under Section 564(b)(1) of the Act, 21 U.S.C. section 360bbb-3(b)(1), unless the authorization is terminated or revoked sooner.  Performed at [facility], [address] [place], [place] [address]   Blood Culture (routine x 2)     Status: None (Preliminary result)   Collection Time: 08/21/23 12:21 AM   Specimen: BLOOD  Result Value Ref Range Status   Specimen Description BLOOD BLOOD LEFT ARM  Final   Special Requests   Final    BOTTLES DRAWN AEROBIC AND ANAEROBIC Blood Culture adequate volume   Culture   Final    NO GROWTH 2 DAYS Performed at [facility], [address] [place], [place] [address]    Report Status PENDING  Incomplete  Blood Culture (routine x 2)     Status: None (Preliminary result)   Collection Time: 08/21/23  2:38 AM   Specimen: BLOOD  Result Value Ref Range Status   Specimen Description BLOOD BLOOD RIGHT ARM  Final   Special Requests   Final     BOTTLES DRAWN AEROBIC AND ANAEROBIC Blood Culture adequate volume   Culture   Final    NO GROWTH 2 DAYS Performed at [facility], [address] [place], [place] [address]    Report Status PENDING  Incomplete    Labs: CBC: Recent Labs  Lab 08/20/23 1416 08/22/23 0613 08/23/23 0417  WBC 4.5 4.1 3.7*  HGB 9.4* 7.2* 6.9*  HCT 28.9* 21.7* 20.9*  MCV 96.0 94.3 93.3  PLT 181 133* 143*   Basic Metabolic Panel: Recent Labs  Lab 08/20/23 1416 08/20/23 1619 08/22/23 0613 08/23/23 0417  NA 131*  --  127* 130*  K 3.8  --  3.7 3.6  CL 97*  --  101 101  CO2 23  --  20* 22  GLUCOSE 107*  --  96 94  BUN 13  --  12 9  CREATININE 0.65  --  0.49 0.43*  CALCIUM 8.3*  --  8.0* 8.0*  MG  --  1.6*  --   --   PHOS  --  4.2  --   --    Liver Function Tests: Recent Labs  Lab 08/20/23 1619 08/23/23 0417  AST 69* 40  ALT 43 26  ALKPHOS 332* 424*  BILITOT 0.7 0.9  PROT 6.5 6.0*  ALBUMIN 2.7* 2.5*   CBG: No results for input(s): "GLUCAP" in the last 168 hours.  Discharge time spent: less than 30 minutes.  Signed: Pennie Banter, DO [facility] 08/23/2023

## 2023-08-23 NOTE — Plan of Care (Signed)

## 2023-08-24 ENCOUNTER — Telehealth: Payer: Self-pay

## 2023-08-24 LAB — TYPE AND SCREEN
ABO/RH(D): O POS
Antibody Screen: NEGATIVE
Unit division: 0

## 2023-08-24 LAB — BPAM RBC
Blood Product Expiration Date: 202411192359
ISSUE DATE / TIME: 202410221123
Unit Type and Rh: 5100

## 2023-08-24 NOTE — Transitions of Care (Post Inpatient/ED Visit) (Signed)
08/24/2023  Name: Sheryl Silva MRN: [id number] DOB: [birth year]  Today's TOC FU Call Status: Today's TOC FU Call Status:: Successful TOC FU Call Completed TOC FU Call Complete Date: 08/24/23 Patient's Name and Date of Birth confirmed.  Transition Care Management Follow-up Telephone Call Date of Discharge: 08/23/23 Discharge Facility: [facility]) Type of Discharge: Inpatient Admission Primary Inpatient Discharge Diagnosis:: Multifocal pneumonia and Cholecystitis How have you been since you were released from the hospital?: Better Any questions or concerns?: Yes Patient Questions/Concerns:: pain left side increased nausea with Anti-virals,She also feels she is to weak for [facility] 0/28 and wants to skip. Patient Questions/Concerns Addressed: Other: (REviewed s/s side effects medications for symptom management,and possible changing [place] location to [place] so she can have support of family)  Items Reviewed: Did you receive and understand the discharge instructions provided?: Yes Medications obtained,verified, and reconciled?: Yes (Medications Reviewed) Any new allergies since your discharge?: No Dietary orders reviewed?: Yes Type of Diet Ordered:: Reg, low fat, Heart Healthy, Ensure supplement Do you have support at home?: No (She resides in [place] , but is in [place] at this time She is weak, and needs oversight assist w/ ADL and iADL's She is currently with daughter)  Medications Reviewed Today: Medications Reviewed Today     Reviewed by Johnnette Barrios, RN (Registered Nurse) on 08/24/23 at 1451  Med List Status: <None>   Medication Order Taking? Sig Documenting Provider Last Dose Status Informant  albuterol (VENTOLIN HFA) 108 (90 Base) MCG/ACT inhaler [id number] Yes Inhale 2 puffs into the lungs every 6 (six) hours as needed for wheezing or shortness of breath. [provider] Taking Active Self  amLODipine (NORVASC) 10 MG tablet [id number]  Take 10 mg by  mouth daily. [provider]  Active Pharmacy Records, Self  amoxicillin-clavulanate (AUGMENTIN) 875-125 MG tablet [id number] Yes Take 1 tablet by mouth 2 (two) times daily for 12 days. Pennie Banter, DO Taking Active   cyanocobalamin (VITAMIN B12) 500 MCG tablet [id number] Yes Take 500 mcg by mouth daily. [provider] Taking Active Pharmacy Records  cyclobenzaprine (FLEXERIL) 10 MG tablet [id number] Yes Take 10 mg by mouth at bedtime as needed for muscle spasms. [provider] Taking Active Pharmacy Records  Doxepin HCl 6 MG TABS [id number] Yes Take 6 mg by mouth at bedtime. [provider] Taking Active Self, Pharmacy Records           Med Note Sharon Seller, Payson Evrard L   Wed Aug 24, 2023  2:50 PM) Uses as needed  DULoxetine (CYMBALTA) 60 MG capsule [id number] Yes Take 60 mg by mouth daily. [provider] Taking Active Self, Pharmacy Records  feeding supplement (ENSURE ENLIVE / ENSURE PLUS) LIQD [id number] Yes Take 237 mLs by mouth 3 (three) times daily between meals. Enedina Finner, MD Taking Active Other  hydrocortisone 2.5 % cream [id number] Yes Apply 1 Application topically 2 (two) times daily as needed (skin irritation). [provider] Taking Active Pharmacy Records  lidocaine-prilocaine (EMLA) cream [id number] Yes Apply to affected area once Jeralyn Ruths, MD Taking Active Other  megestrol (MEGACE) 40 MG tablet [id number] Yes TAKE 1 TABLET BY MOUTH EVERY DAY Orlie Dakin Tollie Pizza, MD Taking Active Self, Pharmacy Records  Multiple Vitamin (MULTIVITAMIN) tablet [id number] Yes Take 1 tablet by mouth daily. [provider] Taking Active Other  naloxone [facility]) nasal spray 4 mg/0.1 mL [id number] Yes Place 1 spray into the nose once. [provider] Taking Active Other  ondansetron (ZOFRAN) 8 MG  tablet [id number] Yes Take 1 tablet (8 mg total) by mouth every 8 (eight) hours as needed for nausea or vomiting. Start on the third day after  chemotherapy. Jeralyn Ruths, MD Taking Active Pharmacy Records  oxyCODONE-acetaminophen [facility]) 10-325 MG tablet [id number] Yes Take 1 tablet by mouth every 6 (six) hours as needed for pain. [provider] Taking Active Self, Pharmacy Records  polyethylene glycol (MIRALAX / GLYCOLAX) 17 g packet [id number] Yes Take 17 g by mouth daily as needed. Enedina Finner, MD Taking Active Other  prochlorperazine (COMPAZINE) 10 MG tablet [id number] Yes Take 1 tablet (10 mg total) by mouth every 6 (six) hours as needed for nausea or vomiting. Jeralyn Ruths, MD Taking Active Pharmacy Records  traMADol (ULTRAM-ER) 300 MG 24 hr tablet [id number] Yes Take 300 mg by mouth daily. [provider] Taking Active Self, Pharmacy Records  valGANciclovir (VALCYTE) 450 MG tablet [id number] Yes Take 2 tablets (900 mg total) by mouth 2 (two) times daily for 28 days. Enedina Finner, MD Taking Active Self, Pharmacy Records  Vitamin D, Ergocalciferol, (DRISDOL) 1.25 MG (50000 UNIT) CAPS capsule [id number] Yes Take 50,000 Units by mouth every 7 (seven) days. [provider] Taking Active Self, Pharmacy Records            Home Care and Equipment/Supplies: Were Home Health Services Ordered?: No Any new equipment or medical supplies ordered?: No  Functional Questionnaire: Do you need assistance with bathing/showering or dressing?: Yes Do you need assistance with meal preparation?: Yes Do you need assistance with eating?: No Do you have difficulty maintaining continence: No Do you need assistance with getting out of bed/getting out of a chair/moving?: No Do you have difficulty managing or taking your medications?: No  Follow up appointments reviewed: PCP Follow-up appointment confirmed?: No (She will call to schedule) [place] Provider Line Number:[phone] Given: No Specialist Hospital Follow-up appointment confirmed?: Yes Date of Specialist follow-up appointment?: 08/29/23 Follow-Up Specialty  Provider:: Oncology for Infusion Needs to schedule surgical f/u Do you need transportation to your follow-up appointment?: No (Daughter will transport) Do you understand care options if your condition(s) worsen?: Yes-patient verbalized understanding (Discussed possible transfer of services to [place] so she can remain with family and have increased oversight)    Goals Addressed             This Visit's Progress    TOC Care Plan       Current Barriers:  Medication management Takes meds as prescribed with no adverse side effects or unmanaged symptoms Provider appointments with Oncology, Gen Surgery and PCP for post hospital follow-up Functional/Safety Lives alone ,needs increased oversight due to weakness and deconditioning    RNCM Clinical Goal(s):  Patient will Medication management Takes meds as prescribed with no adverse side effects or unmanaged symptoms Provider appointments with Oncology, Gen Surgery and PCP for post hospital follow-up Functional/Safety Lives alone ,needs increased oversight due to weakness and deconditioning .   Interventions: Evaluation of current treatment plan related to  self management and patient's adherence to plan as established by provider  Transitions of Care:  New goal. Doctor Visits  - discussed the importance of doctor visits Communication with Oncology  re: side effects of antivirals and consideration of transfer of treatment center if need arises   Patient Goals/Self-Care Activities: Participate in Transition of Care Program/Attend TOC scheduled calls Take all medications as prescribed Attend all scheduled provider appointments Call pharmacy for medication refills 3-7 days in advance of running out of medications Perform all  self care activities independently  Perform IADL's (shopping, preparing meals, housekeeping, managing finances) independently Call provider office for new concerns or questions   Follow Up Plan:  Telephone follow up  appointment with care management team member scheduled for:  08/21/23 2:30pm The patient has been provided with contact information for the care management team and has been advised to call with any health related questions or concerns.          Routine follow-up and on-going assessment evaluation and education of disease processes, recommended interventions for both chronic and acute medical conditions , will occur during each  visit along with ongoing  management of symptoms ,medication reviews and reconciliation. Will make changes and updates episodically to Care Plan and initiate visits / follow-up as indicated .    She [place] currently in [place]. She needs increased oversight and more help with ADL's . She lives alone in [place] and is considering transferring treatment to [place] so she can have additional support on a daily basis . She needs help with ADL and meal prep, She is unable to drive She will consider this option and discuss with Oncology on next visit 10/28. She feels she is to weak to have treatment Monday.  Patient provided with Contact information for [facility] CM   Reviewed goals for care , patient   verbalized understanding and agreement with current POC.   Susa Loffler , BSN, RN Care Management Coordinator [place]   [facility] [email or web address] Direct Dial: [phone]

## 2023-08-26 ENCOUNTER — Other Ambulatory Visit: Payer: Self-pay | Admitting: *Deleted

## 2023-08-26 DIAGNOSIS — C801 Malignant (primary) neoplasm, unspecified: Secondary | ICD-10-CM

## 2023-08-26 LAB — CULTURE, BLOOD (ROUTINE X 2)
Culture: NO GROWTH
Culture: NO GROWTH
Special Requests: ADEQUATE
Special Requests: ADEQUATE

## 2023-08-29 ENCOUNTER — Ambulatory Visit: Payer: 59 | Admitting: Oncology

## 2023-08-29 ENCOUNTER — Inpatient Hospital Stay: Payer: 59

## 2023-08-29 ENCOUNTER — Inpatient Hospital Stay (HOSPITAL_BASED_OUTPATIENT_CLINIC_OR_DEPARTMENT_OTHER): Payer: 59 | Admitting: Oncology

## 2023-08-29 ENCOUNTER — Ambulatory Visit: Payer: 59

## 2023-08-29 ENCOUNTER — Encounter: Payer: Self-pay | Admitting: Oncology

## 2023-08-29 ENCOUNTER — Other Ambulatory Visit: Payer: 59

## 2023-08-29 VITALS — BP 147/90 | HR 107 | Temp 96.8°F | Resp 18 | Ht 66.0 in

## 2023-08-29 DIAGNOSIS — C7951 Secondary malignant neoplasm of bone: Secondary | ICD-10-CM | POA: Diagnosis not present

## 2023-08-29 DIAGNOSIS — C801 Malignant (primary) neoplasm, unspecified: Secondary | ICD-10-CM

## 2023-08-29 LAB — CMP (CANCER CENTER ONLY)
ALT: 22 U/L (ref 0–44)
AST: 34 U/L (ref 15–41)
Albumin: 3.1 g/dL — ABNORMAL LOW (ref 3.5–5.0)
Alkaline Phosphatase: 399 U/L — ABNORMAL HIGH (ref 38–126)
Anion gap: 7 (ref 5–15)
BUN: 8 mg/dL (ref 6–20)
CO2: 22 mmol/L (ref 22–32)
Calcium: 8.5 mg/dL — ABNORMAL LOW (ref 8.9–10.3)
Chloride: 101 mmol/L (ref 98–111)
Creatinine: 0.54 mg/dL (ref 0.44–1.00)
GFR, Estimated: >60 mL/min (ref >60)
Glucose, Bld: 88 mg/dL (ref 70–99)
Potassium: 3.6 mmol/L (ref 3.5–5.1)
Sodium: 130 mmol/L — ABNORMAL LOW (ref 135–145)
Total Bilirubin: 0.7 mg/dL (ref 0.3–1.2)
Total Protein: 6.8 g/dL (ref 6.5–8.1)

## 2023-08-29 LAB — CBC WITH DIFFERENTIAL (CANCER CENTER ONLY)
Abs Immature Granulocytes: 0.13 10*3/uL — ABNORMAL HIGH (ref 0.00–0.07)
Basophils Absolute: 0 10*3/uL (ref 0.0–0.1)
Basophils Relative: 0 %
Eosinophils Absolute: 0 10*3/uL (ref 0.0–0.5)
Eosinophils Relative: 0 %
HCT: 26.1 % — ABNORMAL LOW (ref 36.0–46.0)
Hemoglobin: 8.3 g/dL — ABNORMAL LOW (ref 12.0–15.0)
Immature Granulocytes: 4 %
Lymphocytes Relative: 13 %
Lymphs Abs: 0.4 10*3/uL — ABNORMAL LOW (ref 0.7–4.0)
MCH: 29.7 pg (ref 26.0–34.0)
MCHC: 31.8 g/dL (ref 30.0–36.0)
MCV: 93.5 fL (ref 80.0–100.0)
Monocytes Absolute: 0.3 10*3/uL (ref 0.1–1.0)
Monocytes Relative: 8 %
Neutro Abs: 2.4 10*3/uL (ref 1.7–7.7)
Neutrophils Relative %: 75 %
Platelet Count: 132 10*3/uL — ABNORMAL LOW (ref 150–400)
RBC: 2.79 MIL/uL — ABNORMAL LOW (ref 3.87–5.11)
RDW: 18.3 % — ABNORMAL HIGH (ref 11.5–15.5)
WBC Count: 3.3 10*3/uL — ABNORMAL LOW (ref 4.0–10.5)
nRBC: 0.9 % — ABNORMAL HIGH (ref 0.0–0.2)

## 2023-08-29 MED ORDER — GABAPENTIN 300 MG PO CAPS: 600.0000 mg | ORAL_CAPSULE | Freq: Three times a day (TID) | ORAL | 0 refills | Status: DC

## 2023-08-29 NOTE — Progress Notes (Signed)
Patient states she has been taking 300 mg of gabapentin TID. Does increased to 2 tabs (600 mg) TID per Dr. Orlie Dakin.

## 2023-08-29 NOTE — Progress Notes (Signed)
States she is having weakness and toes are getting numb.

## 2023-08-29 NOTE — Progress Notes (Signed)
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  INTERVAL HISTORY: Patient returns to clinic today for further evaluation and reconsideration of cycle 8 of FOLFOX plus Keytruda.  She was admitted to the hospital last week with abdominal pain and possible cholecystitis.  She was treated with conservative management and is now currently on Augmentin.  She continues to have significant weakness and fatigue and a poor appetite, but does not complain of any further abdominal pain.  She continues to have chronic back pain.  She is also complaining of peripheral neuropathy today.  She has no other neurologic complaints.  She denies any recent fevers or illnesses.  She has no chest pain, shortness of breath, cough, or hemoptysis.  She denies any nausea or vomiting.  She has no urinary complaints.  Patient offers no further specific complaints today.  REVIEW OF SYSTEMS:   Review of Systems  Constitutional:  Positive for malaise/fatigue. Negative for fever and weight loss.  Respiratory: Negative.  Negative for cough, hemoptysis and shortness of breath.   Cardiovascular: Negative.  Negative for chest pain and leg swelling.  Gastrointestinal: Negative.  Negative for abdominal pain and diarrhea.  Genitourinary: Negative.  Negative for dysuria and frequency.  Musculoskeletal:  Positive for back pain.  Skin: Negative.  Negative for rash.  Neurological:  Positive for tingling, sensory change and weakness. Negative for dizziness, focal weakness and headaches.   Psychiatric/Behavioral: Negative.  The patient is not nervous/anxious.     As per HPI. Otherwise, a complete review of systems is negative.  PAST MEDICAL HISTORY: Past Medical History:  Diagnosis Date   Anxiety    Cancer (HCC)    Depression    Heart murmur    Hypertension     PAST SURGICAL HISTORY: Past Surgical History:  Procedure Laterality Date   BIOPSY  07/26/2023   Procedure: BIOPSY;  Surgeon: Toney Reil, MD;  Location: [facility] ENDOSCOPY;  Service: Gastroenterology;;   CESAREAN SECTION     COLONOSCOPY WITH PROPOFOL N/A 02/21/2023   Procedure: COLONOSCOPY WITH PROPOFOL;  Surgeon: Regis Bill, MD;  Location: [facility] ENDOSCOPY;  Service: Endoscopy;  Laterality: N/A;   DILATION AND CURETTAGE OF UTERUS     FLEXIBLE SIGMOIDOSCOPY N/A 07/26/2023   Procedure: FLEXIBLE SIGMOIDOSCOPY;  Surgeon: Toney Reil, MD;  Location: [facility] ENDOSCOPY;  Service: Gastroenterology;  Laterality: N/A;   IR IMAGING GUIDED PORT INSERTION  02/16/2023    FAMILY HISTORY: Family History  Problem Relation Age of Onset   Cancer Maternal Grandmother     ADVANCED DIRECTIVES (Y/N):  N  HEALTH MAINTENANCE: Social History   Tobacco Use   Smoking status: Former    Current packs/day: 0.00    Types: Cigarettes    Quit date: 12/19/2022    Years since quitting: 0.6   Smokeless tobacco: Never   Tobacco comments:    Quit smoking 12/2022  Vaping Use   Vaping status: Never Used  Substance Use Topics   Alcohol use: Yes    Comment: occ   Drug use: No     Colonoscopy:  PAP:  Bone density:  Lipid panel:  Allergies  Allergen Reactions   Nifedipine Palpitations    Current Outpatient Medications  Medication Sig Dispense Refill   albuterol (VENTOLIN HFA) 108 (90 Base) MCG/ACT inhaler Inhale 2 puffs into the lungs every 6 (six) hours as needed for wheezing or shortness of breath.     amLODipine (NORVASC) 10 MG tablet Take 10 mg by mouth daily.     amoxicillin-clavulanate (AUGMENTIN)  875-125 MG tablet Take 1 tablet by mouth 2 (two) times daily for 12 days. 24 tablet 0   cyanocobalamin (VITAMIN B12) 500 MCG tablet Take 500 mcg by mouth daily.     cyclobenzaprine (FLEXERIL) 10 MG tablet Take 10 mg by mouth at bedtime as needed for muscle spasms.     Doxepin HCl 6 MG TABS Take 6 mg by mouth at bedtime.     DULoxetine (CYMBALTA) 60 MG capsule Take 60 mg by mouth daily.     feeding supplement (ENSURE ENLIVE / ENSURE PLUS) LIQD Take 237 mLs by mouth 3 (three) times daily between meals. 237 mL 12   hydrocortisone 2.5 % cream Apply 1 Application topically 2 (two) times daily as needed (skin irritation).     lidocaine-prilocaine (EMLA) cream Apply to affected area once 30 g 3   megestrol (MEGACE) 40 MG tablet TAKE 1 TABLET BY MOUTH EVERY DAY 30 tablet 2   Multiple Vitamin (MULTIVITAMIN) tablet Take 1 tablet by mouth daily.     naloxone (NARCAN) nasal spray 4 mg/0.1 mL Place 1 spray into the nose once.     ondansetron (ZOFRAN) 8 MG tablet Take 1 tablet (8 mg total) by mouth every 8 (eight) hours as needed for nausea or vomiting. Start on the third day after chemotherapy. 60 tablet 2   oxyCODONE-acetaminophen (PERCOCET) 10-325 MG tablet Take 1 tablet by mouth every 6 (six) hours as needed for pain.     polyethylene glycol (MIRALAX / GLYCOLAX) 17 g packet Take 17 g by mouth daily as needed. 14 each 0   prochlorperazine (COMPAZINE) 10 MG tablet Take 1 tablet (10 mg total) by mouth every 6 (six) hours as needed for nausea or vomiting. 60 tablet 2   traMADol (ULTRAM-ER) 300 MG 24 hr tablet Take 300 mg by mouth daily.     valGANciclovir (VALCYTE) 450 MG tablet Take 2 tablets (900 mg total) by mouth 2 (two) times daily for 28 days. 112 tablet 0   Vitamin D, Ergocalciferol, (DRISDOL) 1.25 MG (50000 UNIT) CAPS capsule Take 50,000 Units by mouth every 7 (seven) days.     No current facility-administered medications for this visit.   Facility-Administered Medications Ordered in Other Visits   Medication Dose Route Frequency Provider Last Rate Last Admin   sodium chloride flush (NS) 0.9 % injection 10 mL  10 mL Intracatheter PRN Jeralyn Ruths, MD   10 mL at 06/29/23 1430    OBJECTIVE: Vitals:   08/29/23 0839  BP: (!) 147/90  Pulse: (!) 107  Resp: 18  Temp: (!) 96.8 F (36 C)  SpO2: 100%      Body mass index is 25.98 kg/m.    ECOG FS:2 - Symptomatic, <50% confined to bed  General: Well-developed, well-nourished, no acute distress. Eyes: Pink conjunctiva, anicteric sclera. HEENT: Normocephalic, moist mucous membranes. Lungs: No audible wheezing or coughing. Heart: Regular rate and rhythm. Abdomen: Soft, nontender, no obvious distention. Musculoskeletal: No edema, cyanosis, or clubbing. Neuro: Alert, answering all questions appropriately. Cranial nerves grossly intact. Skin: No rashes or petechiae noted. Psych: Normal affect.  LAB RESULTS:  Lab Results  Component Value Date   NA 130 (L) 08/29/2023   K 3.6 08/29/2023   CL 101 08/29/2023   CO2 22 08/29/2023   GLUCOSE 88 08/29/2023   BUN 8 08/29/2023   CREATININE 0.54 08/29/2023   CALCIUM 8.5 (L) 08/29/2023   PROT 6.8 08/29/2023   ALBUMIN 3.1 (L) 08/29/2023   AST 34 08/29/2023   ALT 22 08/29/2023   ALKPHOS 399 (H) 08/29/2023   BILITOT 0.7 08/29/2023   GFRNONAA >60 08/29/2023   GFRAA >60 09/27/2018    Lab Results  Component Value Date   WBC 3.3 (L) 08/29/2023   NEUTROABS 2.4 08/29/2023   HGB 8.3 (L) 08/29/2023   HCT 26.1 (L) 08/29/2023   MCV 93.5 08/29/2023   PLT 132 (L) 08/29/2023     STUDIES: MR ABDOMEN MRCP W WO CONTAST  Result Date: 08/21/2023 CLINICAL DATA:  47 year old female with history of transaminitis and abdominal pain. Gallstones. Evaluate for choledocholithiasis. EXAM: MRI ABDOMEN WITHOUT AND WITH CONTRAST (INCLUDING MRCP) TECHNIQUE: Multiplanar multisequence MR imaging of the abdomen was performed both before and after the administration of intravenous contrast. Heavily  T2-weighted images of the biliary and pancreatic ducts were obtained, and three-dimensional MRCP images were rendered by post processing. CONTRAST:  7mL GADAVIST GADOBUTROL 1 MMOL/ML IV SOLN COMPARISON:  No prior abdominal MRI. CT of the abdomen and pelvis 08/20/2023. FINDINGS: Lower chest: Elevation of the right hemidiaphragm. Multiple prominent borderline enlarged juxta diaphragmatic lymph nodes are noted on the right measuring up to 8 mm in short axis (axial image 21 of series 26). Hepatobiliary: Multiple filling defects are noted within the gallbladder, most notably a 1.9 cm filling defect near the neck of the gallbladder, indicative of gallstones. Gallbladder is severely distended. Gallbladder wall appears thickened and edematous with trace volume of pericholecystic fluid. MRCP images are limited by patient respiratory motion. With this limitation in mind, no definite filling defect within the common bile duct to suggest choledocholithiasis. Common bile duct is dilated measuring up to 11 mm. There is abrupt tapering of the distal common bile duct shortly above the level of the ampulla, suggesting a benign stricture. No intrahepatic biliary ductal dilatation noted at this time. Mild increased T2 signal intensity in a periportal distribution, indicative of periportal edema. No suspicious cystic or solid hepatic lesions. Pancreas: No pancreatic mass. No pancreatic ductal dilatation. No pancreatic or peripancreatic fluid collections or inflammatory changes. Spleen:  Unremarkable. Adrenals/Urinary Tract: Bilateral kidneys and adrenal glands are normal in appearance. No hydroureteronephrosis in the visualized portions of the abdomen. Stomach/Bowel: Visualized portions are unremarkable. Vascular/Lymphatic: Atherosclerosis of the abdominal aorta. No aneurysm identified in the visualized abdominal vasculature. No lymphadenopathy noted in the abdomen. Other: No significant volume of ascites noted in the visualized  portions of the peritoneal cavity. Musculoskeletal: Innumerable small areas of heterogeneous enhancement noted throughout the visualized axial skeleton, indicative of widespread metastatic disease to the bones. Multiple prominent borderline enlarged enhancing lymph nodes in the lower left axial region partially imaged. IMPRESSION: 1. Cholelithiasis with evidence of acute cholecystitis. Surgical consultation is recommended. 2. No choledocholithiasis. 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 This is associated with common bile duct dilatation, but no frank intrahepatic biliary ductal dilatation is noted at this time. 3. Diffuse periportal edema in the liver. 4. Borderline enlarged juxta diaphragmatic lymph nodes and left axillary lymph nodes, likely metastatic. Widespread metastatic disease to the bones redemonstrated. Electronically Signed   By: Brayton Mars.D.  On: 08/21/2023 07:41   MR 3D Recon At Scanner  Result Date: 08/21/2023 CLINICAL DATA:  47 year old female with history of transaminitis and abdominal pain. Gallstones. Evaluate for choledocholithiasis. EXAM: MRI ABDOMEN WITHOUT AND WITH CONTRAST (INCLUDING MRCP) TECHNIQUE: Multiplanar multisequence MR imaging of the abdomen was performed both before and after the administration of intravenous contrast. Heavily T2-weighted images of the biliary and pancreatic ducts were obtained, and three-dimensional MRCP images were rendered by post processing. CONTRAST:  7mL GADAVIST GADOBUTROL 1 MMOL/ML IV SOLN COMPARISON:  No prior abdominal MRI. CT of the abdomen and pelvis 08/20/2023. FINDINGS: Lower chest: Elevation of the right hemidiaphragm. Multiple prominent borderline enlarged juxta diaphragmatic lymph nodes are noted on the right measuring up to 8 mm in short axis (axial image 21 of series 26). Hepatobiliary: Multiple filling defects are noted within the gallbladder, most notably a 1.9 cm filling defect near the  neck of the gallbladder, indicative of gallstones. Gallbladder is severely distended. Gallbladder wall appears thickened and edematous with trace volume of pericholecystic fluid. MRCP images are limited by patient respiratory motion. With this limitation in mind, no definite filling defect within the common bile duct to suggest choledocholithiasis. Common bile duct is dilated measuring up to 11 mm. There is abrupt tapering of the distal common bile duct shortly above the level of the ampulla, suggesting a benign stricture. No intrahepatic biliary ductal dilatation noted at this time. Mild increased T2 signal intensity in a periportal distribution, indicative of periportal edema. No suspicious cystic or solid hepatic lesions. Pancreas: No pancreatic mass. No pancreatic ductal dilatation. No pancreatic or peripancreatic fluid collections or inflammatory changes. Spleen:  Unremarkable. Adrenals/Urinary Tract: Bilateral kidneys and adrenal glands are normal in appearance. No hydroureteronephrosis in the visualized portions of the abdomen. Stomach/Bowel: Visualized portions are unremarkable. Vascular/Lymphatic: Atherosclerosis of the abdominal aorta. No aneurysm identified in the visualized abdominal vasculature. No lymphadenopathy noted in the abdomen. Other: No significant volume of ascites noted in the visualized portions of the peritoneal cavity. Musculoskeletal: Innumerable small areas of heterogeneous enhancement noted throughout the visualized axial skeleton, indicative of widespread metastatic disease to the bones. Multiple prominent borderline enlarged enhancing lymph nodes in the lower left axial region partially imaged. IMPRESSION: 1. Cholelithiasis with evidence of acute cholecystitis. Surgical consultation is recommended. 2. No choledocholithiasis. 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 This is associated with common bile duct dilatation, but no frank intrahepatic  biliary ductal dilatation is noted at this time. 3. Diffuse periportal edema in the liver. 4. Borderline enlarged juxta diaphragmatic lymph nodes and left axillary lymph nodes, likely metastatic. Widespread metastatic disease to the bones redemonstrated. Electronically Signed   By: Trudie Reed M.D.   On: 08/21/2023 07:41   US Venous Img Lower Unilateral Left  Result Date: 08/21/2023 CLINICAL DATA:  Left lower extremity pain.  Abnormal CT. EXAM: LEFT LOWER EXTREMITY VENOUS DOPPLER ULTRASOUND TECHNIQUE: Gray-scale sonography with compression, as well as color and duplex ultrasound, were performed to evaluate the deep venous system(s) from the level of the common femoral vein through the popliteal and proximal calf veins. COMPARISON:  None Available. FINDINGS: VENOUS Normal compressibility of the common femoral, superficial femoral, and popliteal veins, as well as the visualized calf veins. Visualized portions of profunda femoral vein and great saphenous vein unremarkable. No filling defects to suggest DVT on grayscale or color Doppler imaging. Doppler waveforms show normal direction of venous flow, normal respiratory plasticity and response to augmentation. Limited views of  the contralateral common femoral vein are unremarkable. OTHER None. Limitations: none IMPRESSION: No evidence of left lower extremity DVT. Electronically Signed   By: Narda Rutherford M.D.   On: 08/21/2023 00:08   US ABDOMEN LIMITED RUQ (LIVER/GB)  Result Date: 08/21/2023 CLINICAL DATA:  Abdominal pain. EXAM: ULTRASOUND ABDOMEN LIMITED RIGHT UPPER QUADRANT COMPARISON:  CT earlier today FINDINGS: Gallbladder: Moderately distended containing multiple intraluminal stones and sludge. Edematous gallbladder wall thickening at 5 mm. No sonographic Murphy sign noted by sonographer. Common bile duct: Diameter: 9 mm.  No visualized choledocholithiasis. Liver: There is central intrahepatic biliary ductal dilatation. No focal lesion identified.  Within normal limits in parenchymal echogenicity. Portal vein is patent on color Doppler imaging with normal direction of blood flow towards the liver. Other: No definite pericholecystic fluid by ultrasound. IMPRESSION: 1. Sonographic findings suspicious for acute cholecystitis. Gallstones and sludge with edematous gallbladder wall thickening. 2. Dilated common bile duct with intrahepatic biliary ductal dilatation. No visualized choledocholithiasis. MRCP could be performed for further assessment of the biliary tree as clinically indicated. Electronically Signed   By: Narda Rutherford M.D.   On: 08/21/2023 00:07   CT Angio Chest PE W and/or Wo Contrast  Result Date: 08/20/2023 CLINICAL DATA:  Pulmonary embolus suspected with high probability. Chest pain in the cancer patient. EXAM: CT ANGIOGRAPHY CHEST WITH CONTRAST TECHNIQUE: Multidetector CT imaging of the chest was performed using the standard protocol during bolus administration of intravenous contrast. Multiplanar CT image reconstructions and MIPs were obtained to evaluate the vascular anatomy. RADIATION DOSE REDUCTION: This exam was performed according to the departmental dose-optimization program which includes automated exposure control, adjustment of the mA and/or kV according to patient size and/or use of iterative reconstruction technique. CONTRAST:  OMNIPAQUE IOHEXOL 350 MG/ML SOLN COMPARISON:  CT chest 02/03/2023.  PET-CT 07/06/2023 FINDINGS: Cardiovascular: Technically adequate study with good opacification of the central and segmental pulmonary arteries and mild motion artifact. No focal filling defects are demonstrated. No evidence of significant pulmonary embolus. Mild cardiac enlargement. No pericardial effusions. Normal caliber thoracic aorta. No aortic dissection. 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 Esophagus is decompressed. Thyroid gland is unremarkable. Prominent lymphadenopathy in the  left axilla and left supraclavicular region. Left axillary lymph nodes measure up to about 2.3 cm short axis dimension. Lymph nodes are similar in size to prior PET-CT. No significant mediastinal lymphadenopathy. Lungs/Pleura: Motion artifact limits examination. There is patchy airspace disease throughout both lungs. This could indicate multifocal pneumonia or edema. No pleural effusions. No pneumothorax. Upper Abdomen: Enlarged lymph nodes in the cardiophrenic angle. No acute abnormalities demonstrated in the visualized upper abdomen. 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 Review of the MIP images confirms the above findings. IMPRESSION: 1. No evidence of significant pulmonary embolus. 2. Patchy airspace disease throughout both lungs likely representing edema or multifocal pneumonia. 3. Metastatic lymphadenopathy in the left axilla and supraclavicular region. 4. Diffuse sclerotic skeletal metastasis. Electronically Signed   By: Burman Nieves M.D.   On: 08/20/2023 21:42   CT ABDOMEN PELVIS W CONTRAST  Result Date: 08/20/2023 CLINICAL DATA:  Abdominal pain, acute, nonlocalized. Current cancer patient. Pain in abdomen. EXAM: CT ABDOMEN AND PELVIS WITH CONTRAST TECHNIQUE: Multidetector CT imaging of the abdomen and pelvis was performed using the standard protocol following bolus administration of intravenous contrast. RADIATION DOSE REDUCTION: This exam was performed according to the departmental dose-optimization program which includes automated exposure control, adjustment of the mA  and/or kV according to patient size and/or use of iterative reconstruction technique. CONTRAST:  OMNIPAQUE IOHEXOL 350 MG/ML SOLN COMPARISON:  Ultrasound pelvis 07/22/2023, CT abdomen pelvis 07/22/2023, pet ct 07/06/23 FINDINGS: Lower chest: Please see separately dictated CT angiography chest 08/20/2023 Hepatobiliary: No focal liver  abnormality. Multiple calcified gallstones within the gallbladder lumen. Question gallbladder wall thickening and pericholecystic fluid. No biliary dilatation. Pancreas: No focal lesion. Normal pancreatic contour. No surrounding inflammatory changes. No main pancreatic ductal dilatation. Spleen: Normal in size without focal abnormality. Adrenals/Urinary Tract: No adrenal nodule bilaterally. Bilateral kidneys enhance symmetrically. No hydronephrosis. No hydroureter. The urinary bladder is unremarkable. Stomach/Bowel: Stomach is within normal limits. No evidence of bowel wall thickening or dilatation. Appendix appears normal. Vascular/Lymphatic: Asymmetrically hypodense left femoral vein and external iliac vein compared to the right with interval increase in size compared to prior. No abdominal aorta or iliac aneurysm. Severe atherosclerotic plaque of the aorta and its branches. Persistent prominent but nonenlarged retroperitoneal and mesenteric lymph nodes. No abdominal, pelvic, or inguinal lymphadenopathy. Reproductive: Uterine fibroid (8:83). Right adnexa is unremarkable. Similar-appearing heterogeneous hypodense 5.4 x 4.6 cm left adnexal/ovarian mass. Other: Trace volume rectouterine pouch free fluid. No intraperitoneal free gas. No organized fluid collection. Musculoskeletal: No abdominal wall hernia or abnormality. Redemonstrate of diffuse axial and appendicular sclerotic metastases. No acute displaced fracture. Multilevel degenerative changes of the spine. IMPRESSION: 1. Cholelithiasis with question gallbladder wall thickening and pericholecystic fluid. Recommend right upper quadrant ultrasound for further evaluation. 2. Asymmetrically hypodense left femoral vein and external iliac vein compared to the right with interval increase in size compared to prior. Query underlying thrombus. Consider ultrasound if clinically indicated. 3. Similar-appearing heterogeneous hypodense 5.4 x 4.6 cm left adnexal/ovarian mass.  4. Trace volume rectouterine pouch free fluid. 5. Persistent diffuse axial and appendicular sclerotic metastases. 6. Uterine fibroid. 7. Please see separately dictated CT angiography chest 08/20/2023. Electronically Signed   By: Tish Frederickson M.D.   On: 08/20/2023 21:42   DG Chest 2 View  Result Date: 08/20/2023 CLINICAL DATA:  Chest pain. EXAM: CHEST - 2 VIEW COMPARISON:  07/22/2023. FINDINGS: Bilateral lung fields are clear. Bilateral costophrenic angles are clear. Normal cardio-mediastinal silhouette. No acute osseous abnormalities. Redemonstration of extensive sclerotic metastases throughout the imaged bones. No pathological fracture seen. The soft tissues are within normal limits. Right-sided CT Port-A-Cath is seen with its tip overlying the cavoatrial junction region. IMPRESSION: 1. No active cardiopulmonary disease. 2. Diffuse osseous metastases. Electronically Signed   By: Jules Schick M.D.   On: 08/20/2023 15:27   MR THORACIC SPINE W WO CONTRAST  Result Date: 07/30/2023 CLINICAL DATA:  Metastatic carcinoma of gastrointestinal primary. EXAM: MRI THORACIC WITHOUT AND WITH CONTRAST TECHNIQUE: Multiplanar and multiecho pulse sequences of the thoracic spine were obtained without and with intravenous contrast. CONTRAST:  7mL GADAVIST GADOBUTROL 1 MMOL/ML IV SOLN COMPARISON:  None Available. FINDINGS: Alignment:  Normal Vertebrae: Widespread metastatic disease throughout the marrow spaces of every vertebral body level with involvement of the vertebral bodies and posterior elements. No pathologic fracture is seen. No evidence of epidural tumor. No evidence of metastatic disease to the cord. No foraminal disease identified in the thoracic region. Cord:  No evidence of metastatic disease to the cord is self. Paraspinal and other soft tissues: Posterior rib involvement is present as well. Disc levels: Disc degeneration and bulging at T7-8. Narrowing of the ventral subarachnoid space but no compression of  the cord. IMPRESSION: 1. Widespread metastatic disease throughout the marrow spaces of every  vertebral body level with involvement of the vertebral bodies and posterior elements. No pathologic fracture. No evidence of epidural tumor. No evidence of metastatic disease to the cord or neural foramina. 2. Disc degeneration and bulging at T7-8 with narrowing of the ventral subarachnoid space but no compression of the cord. Electronically Signed   By: Paulina Fusi M.D.   On: 07/30/2023 21:16   MR Lumbar Spine W Wo Contrast  Result Date: 07/30/2023 CLINICAL DATA:  Metastatic adenocarcinoma of gastrointestinal source. EXAM: MRI LUMBAR SPINE WITHOUT AND WITH CONTRAST TECHNIQUE: Multiplanar and multiecho pulse sequences of the lumbar spine were obtained without and with intravenous contrast. CONTRAST:  7mL GADAVIST GADOBUTROL 1 MMOL/ML IV SOLN COMPARISON:  04/17/2022 FINDINGS: Segmentation:  5 lumbar type vertebral bodies. Alignment:  Normal Vertebrae: Metastatic disease throughout the marrow spaces with involvement of every vertebral level and the sacrum. Iliac bones are also diffusely involved. The spinal involvement includes vertebral body involvement and posterior element involvement. No evidence of a pathologic fracture. Some extraosseous extension of tumor along the left side of the L2 vertebral body. Early tumor involvement of the intervertebral foramina on the left at L1-2 and L2-3. Conus medullaris and cauda equina: Conus extends to the L1 level. No evidence of metastatic disease to the distal cord, conus tip or nerve roots. Paraspinal and other soft tissues: Otherwise negative Disc levels: No significant disc level pathology. IMPRESSION: 1. Widespread osseous metastatic disease throughout the spine, sacrum and iliac bones. No evidence of pathologic fracture. Some extraosseous extension of tumor along the left side of the L2 vertebral body. Early tumor involvement of the intervertebral foramina on the left at  L1-2 and L2-3. 2. No evidence of metastatic disease to the distal cord, conus tip or nerve roots. Electronically Signed   By: Paulina Fusi M.D.   On: 07/30/2023 21:13   MR CERVICAL SPINE W WO CONTRAST  Result Date: 07/30/2023 CLINICAL DATA:  Metastatic disease evaluation. 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 EXAM: MRI CERVICAL SPINE WITHOUT AND WITH CONTRAST TECHNIQUE: Multiplanar and multiecho pulse sequences of the cervical spine, to include the craniocervical junction and cervicothoracic junction, were obtained without and with intravenous contrast. CONTRAST:  7mL GADAVIST GADOBUTROL 1 MMOL/ML IV SOLN COMPARISON:  04/04/2022 FINDINGS: Alignment: Straightening of the normal cervical lordosis. Vertebrae and spinal cord: Metastatic disease throughout the region affecting all of the vertebral bodies, the clivus and the posterior elements. No evidence of extraosseous tumor causing neural encroachment. No metastatic disease to the spinal cord itself. Posterior Fossa, vertebral arteries, paraspinal tissues: Enlarged cervical lymph nodes on the left. Disc levels: Mild bulging of the disc. Involution of a previously seen herniation at C4-5. No compressive stenosis of the canal or foramina due to disc disease. IMPRESSION: 1. Widespread osseous metastatic disease throughout the cervical spine, the clivus and the posterior elements. Involvement of every vertebral level. No evidence of extraosseous tumor causing neural encroachment. No evidence of metastatic disease to the spinal cord itself. 2. Enlarged cervical lymph nodes on the left consistent with metastatic disease. 3. Involution of a previously seen disc herniation at C4-5. No compressive stenosis of the canal or foramina due to degenerative disease. Electronically Signed   By: Paulina Fusi M.D.   On: 07/30/2023 21:09    ONCOLOGY: Patient's workup was initiated at [facility] in February 2024 at which point PET scan revealed widely metastatic  disease with innumerable sclerotic bony lesions as well as axillary, mediastinal, and abdominal lymphadenopathy.  Subsequent biopsy of a  right axillary lymph node revealed signet ring adenocarcinoma, likely GI origin.  Additional testing revealed high tumor mutational burden.  Further workup at [facility] in April 2024 revealed a negative EGD, and negative CT scanning of the brain.  CT scan of the abdomen and follow-up pelvic ultrasound revealed a large left adnexal mass that was not commented on PET scan from February.  CA 19-9 is normal, but CA125 and CEA are mildly elevated.    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  Repeat PET scan on February 24, 2023 reviewed independently confirming widespread metastatic disease.  Left adnexal mass is now PET positive of unclear clinical significance and after evaluation by gyn-onc, ultimately would not change the overall treatment plan.  Repeat PET scan on July 06, 2023 reviewed independently with stable to mildly progressive disease, but not significant enough to alter the treatment plan.  Patient is receiving FOLFOX plus Keytruda every 3 weeks for 12 cycles followed by maintenance Keytruda for at least 2 years.  Patient also received Zometa on even number cycles.  Patient has not received chemotherapy since July 18, 2023.  Patient once again wishes to delay treatment.  We discussed possibly proceeding with Keytruda only which she declined.  Return to clinic in 2 weeks for further evaluation and reconsideration of cycle 8.  Consider reimage with PET scan in December 2024.   Adnexal mass: Repeat PET scan as above.  Awaiting pathology from outside biopsy for further evaluation.  Appreciate gyn-onc input. Pain: Chronic and unchanged.  Continue follow-up with her physician in the pain clinic in [place], [place].  Patient now completed XRT.  Peripheral neuropathy: Patient states she is taking gabapentin 300 mg 3 times a day, but this is not on her med list.  Will confirm with patient and adjust dose as necessary. Bony disease: Continue Zometa on even number cycles as above.  Patient last received Zometa on June 27, 2023. Hyponatremia: Chronic and unchanged.  Patient sodium level is 130 today.   Hypokalemia: Resolved.   Transaminitis: Resolved.  Neither oxaliplatin nor 5-FU need to be dose reduced. Anemia: Chronic and unchanged.  Patient's hemoglobin has improved to 8.3. Neutropenia: Resolved.  Continue Fulphilia with pump removal. Thrombocytopenia: Chronic and unchanged.  Patient platelet count is 132 today. Weight loss/poor appetite: Continue Megace as prescribed. Hypertension: Well-controlled, monitor.   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 Patient is sexually active, menstruating, and not on birth control however. She verbalizes consent to proceed with treatment after previous discussion of potential life threatening or damaging effects to embryo. Megace not likely to cause ovarian suppression.  Patient also previously declined pelvic ultrasound to evaluate for pregnancy.  Patient previously discussed with nurse practitioner birth control including abstinence, condoms, OCPs, Depo-Lupron, vs IUD. Discussed pros and cons of each. She will think about these and let us know how she would like to proceed.  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  She missed her ID appointment last week secondary to hospital admission.  She has been instructed to call and reschedule this appointment.  Patient currently on valganciclovir.   Coping/anxiety: Patient was previously given a referral to [facility].  Patient expressed understanding and was in agreement with this plan. She also understands that She can call clinic at any time with any questions, concerns, or  complaints.  Cancer Staging  Signet ring cell adenocarcinoma [facility]) Staging form: Exocrine Pancreas, AJCC 8th Edition - Clinical stage from 02/07/2023: Stage IV (cTX, cNX, pM1) - Signed by Jeralyn Ruths, MD on 02/14/2023 Stage prefix: Initial diagnosis   Jeralyn Ruths, MD   08/29/2023 9:44 AM

## 2023-08-29 NOTE — Addendum Note (Signed)
Addended by: Luz Lex on: 08/29/2023 11:57 AM   Modules accepted: Orders

## 2023-08-30 ENCOUNTER — Telehealth: Payer: Self-pay | Admitting: *Deleted

## 2023-08-30 LAB — BETA HCG QUANT (REF LAB): hCG Quant: 85 m[IU]/mL

## 2023-08-30 LAB — CMV DNA, QUANTITATIVE, PCR
CMV DNA Quant: NEGATIVE [IU]/mL
Log10 CMV Qn DNA Pl: UNDETERMINED {Log}

## 2023-08-30 NOTE — Telephone Encounter (Signed)
Call returned to patient and advised that the directions on new prescription has her taking double what she was taking. She states that she did not even look at that and now understands. She thanked me for letting her know

## 2023-08-30 NOTE — Telephone Encounter (Signed)
Patient called reporting that the Gabapentin sent in is the same as she has been on and she was told that the dose is being increased. Please advise

## 2023-08-31 ENCOUNTER — Inpatient Hospital Stay: Payer: 59

## 2023-08-31 ENCOUNTER — Other Ambulatory Visit: Payer: Self-pay | Admitting: Oncology

## 2023-08-31 ENCOUNTER — Other Ambulatory Visit: Payer: Self-pay

## 2023-08-31 ENCOUNTER — Telehealth: Payer: Self-pay

## 2023-08-31 DIAGNOSIS — C801 Malignant (primary) neoplasm, unspecified: Secondary | ICD-10-CM

## 2023-08-31 NOTE — Patient Outreach (Signed)
Care Management  Transitions of Care Program Transitions of Care Post-discharge week 2   08/31/2023 Name: Sheryl Silva MRN: [id number] DOB: 11/11/1975  Subjective: Sheryl Silva is a 47 y.o. year old female who is a primary care patient of Hande, Roderic Palau, MD. The Care Management team Engaged with patient Engaged with patient by telephone to assess and address transitions of care needs.   Consent to Services:  Patient was given information about care management services, agreed to services, and gave verbal consent to participate.   Assessment:   Patient voices no new complaints Patient has not developed/ reported any new Medical issues / Dx or acute changes.- since last follow-up call for most recent  Hospital stay    10/19-10/22/ 2024  She has decided to move back to [place] to be closer to her daughter and extended family,  for additional support. She has already put in a Housing request . She  requested assistance for transferring her care She needs a new Cancer Treatment Center, a new PCP and Pain Management clinic. Call placed to a potential PCP [facility] [phone]) and Cancer treatment center [facility] [phone]. Will follow-up with Dr Hollice Gong  and Dr Eston Esters office 09/01/23 to provide contact info and begin process of transferring services and ensuring treatments can continue . Referral made to [facility] SW to assist with transition. Patient educated on red flags s/s to watch for and was encouraged to report any of these identified , any new symptoms , changes in baseline or  medication regimen,  change in health status  /  well-being, or safety concerns to PCP and / or the  [facility] Case Management team .          SDOH Interventions    Flowsheet Row Telephone from 08/31/2023 in [place] [facility]PART[facility] ED to Hosp-Admission (Discharged) from 07/22/2023 in [facility] Clinical Support from  02/15/2023 in [facility] Visit from 02/14/2023 in [facility]  SDOH Interventions      Food Insecurity Interventions -- Other (Comment), Inpatient TOC  [Resources added to AVS] -- --  Housing Interventions AMB Referral -- -- Intervention Not Indicated  Transportation Interventions -- Other (Comment), Inpatient TOC  [resources added to AVS] -- --  Utilities Interventions -- -- -- Intervention Not Indicated  Alcohol Usage Interventions -- -- Intervention Not Indicated (Score <7) --  Financial Strain Interventions -- -- Other (Comment)  [[employer] referral] --  Physical Activity Interventions -- -- Intervention Not Indicated --  Stress Interventions -- -- Intervention Not Indicated --  Social Connections Interventions -- -- Intervention Not Indicated --        Goals Addressed             This Visit's Progress    TOC Care Plan       Current Barriers:  Medication management Takes meds as prescribed with no adverse side effects or unmanaged symptoms Provider appointments with Oncology, Gen Surgery and PCP for post hospital follow-up Functional/Safety Lives alone ,needs increased oversight due to weakness and deconditioning    RNCM Clinical Goal(s):  Patient will Medication management Takes meds as prescribed with no adverse side effects or unmanaged symptoms Provider appointments with Oncology, Gen Surgery and PCP for post hospital follow-up Functional/Safety Lives alone ,needs increased oversight due to weakness and deconditioning .   Interventions: Evaluation of current treatment plan related to  self management and patient's  adherence to plan as established by provider  Transitions of Care:  Goal on track:  Yes. Doctor Visits  - discussed the importance of doctor visits Communication with Oncology  re: side effects of antivirals and consideration of transfer of treatment center if need arises  Contacted  provider for patient needs Transfer services and fid new providers Cancer Treatment Center, New PCP, New Pain Management Clinic , Secure new Housing   Patient Goals/Self-Care Activities: Participate in Transition of Care Program/Attend [facility] scheduled calls Take all medications as prescribed Attend all scheduled provider appointments Call pharmacy for medication refills 3-7 days in advance of running out of medications Perform all self care activities independently  Perform IADL's (shopping, preparing meals, housekeeping, managing finances) independently Call provider office for new concerns or questions   Follow Up Plan:  Telephone follow up appointment with care management team member scheduled for:  09/08/23 3:00pm The patient has been provided with contact information for the care management team and has been advised to call with any health related questions or concerns.          Plan: The patient has been provided with contact information for the care management team and has been advised to call with any health related questions or concerns.  Routine follow-up and on-going assessment evaluation and education of disease processes, recommended interventions for both chronic and acute medical conditions , will occur during each weekly visit along with ongoing review of symptoms ,medication reviews and reconciliation. Any updates , inconsistencies, discrepancies or acute care concerns will be addressed and routed to the correct Practitioner if indicated   Please refer to Care Plan for goals and interventions -Effectiveness of interventions, symptom management and outcomes will be evaluated  weekly during [facility] 30-day Program Outreach calls  . Any necessary  changes and updates to Care Plan will be completed episodically    Reviewed goals for care  Patient provided with Contact information and verbalized understanding with current POC.  Susa Loffler , BSN, RN Care Management Coordinator [facility]   [facility] [email or web address] Direct Dial: [phone]

## 2023-09-01 ENCOUNTER — Telehealth: Payer: Self-pay | Admitting: *Deleted

## 2023-09-01 ENCOUNTER — Telehealth: Payer: Self-pay

## 2023-09-01 ENCOUNTER — Encounter: Payer: Self-pay | Admitting: Oncology

## 2023-09-01 NOTE — Patient Outreach (Signed)
Call placed to PCP Dr Marcello Fennel ( spoke with [facility]) informed them of patients intention to move to [place] to be closer to family, and have additional support. Potential new PCP [facility] She will see Sidonie Dickens, NP [phone] Fax [phone]. Current medical records were requested to be faxed to [facility].  Call placed to Dr Orlie Dakin ( Oncology) Left message regarding her request to transfer and continue her treatments in [place] . LM and requested they reach out to potential new Oncology Center to verify Treatments can continue and the current regime is available. Potential Center [facility] @ [place] [phone], They requested current records, Diagnostics,Current treatment pan, initial consult and approval for treatment plan.  May ask for [facility] ext [phone] or Erlene Senters [phone] Per patient she intend to get her 11/11/ treatment here in [place] and hopes to be able to receive her next 12/2/ treatment in [place] , Scientist, research (physical sciences), RN Care Management Coordinator [place]   [facility] [email or web address] Direct Dial: [phone] .

## 2023-09-01 NOTE — Progress Notes (Signed)
[facility] Care Clinical Summary 2024-09 Name: Sheryl Silva, Sheryl Silva DOB: December 20, 1975 Patient MRN: [id number]  Referring Provider: Gerarda Fraction [facility] Name: Marissa Nestle Date of Last Psychiatric Consultant Review: 2023-08-01 Completed Behavioral Health Coach Visits  Completed [facility] Visits  Total Care Time: 10 minutes  Diagnosis(es): F41.9: Anxiety disorder, unspecified F33.1: major depressive disorder, recurrent, moderate  Most Recent Assessments Depression Assessment (PHQ-9) NA Anxiety Assessment (GAD-7) NA PTSD Checklist Assessment (PCL-5) NA BAM - Use NA BAM - Risk Factor NA BAM - Protective Factor NA  Current Medications (Name, Dose) No changes to psychiatric medication recommendations. No new action requested. Please reference last Care Plan for most up-to-date behavioral health recommendations.  Safety Plan: No Safety Plan Date:

## 2023-09-01 NOTE — Telephone Encounter (Signed)
Case Manager from [facility] called reporting that patient is going to move to [place] by December, She palns to still come for her 11/11 infusion and is asking that we refer her to doctor in physician so that she can get her infusion in Dec there. NCM states that the closest Cancer center would be [place] [facility] [place] physician ph [phone]; she asks if Dr Orlie Dakin can speak with doctor there to coordinate continuation of care .

## 2023-09-05 ENCOUNTER — Other Ambulatory Visit: Payer: 59

## 2023-09-05 ENCOUNTER — Ambulatory Visit: Payer: 59 | Admitting: Oncology

## 2023-09-05 ENCOUNTER — Ambulatory Visit: Payer: 59

## 2023-09-08 ENCOUNTER — Telehealth: Payer: Self-pay | Admitting: *Deleted

## 2023-09-08 ENCOUNTER — Other Ambulatory Visit: Payer: Self-pay

## 2023-09-08 ENCOUNTER — Inpatient Hospital Stay: Payer: 59 | Attending: Oncology

## 2023-09-08 ENCOUNTER — Encounter: Payer: Self-pay | Admitting: Hospice and Palliative Medicine

## 2023-09-08 ENCOUNTER — Telehealth: Payer: Self-pay

## 2023-09-08 ENCOUNTER — Inpatient Hospital Stay: Payer: 59

## 2023-09-08 ENCOUNTER — Ambulatory Visit: Payer: Self-pay | Admitting: Licensed Clinical Social Worker

## 2023-09-08 ENCOUNTER — Other Ambulatory Visit: Payer: Self-pay | Admitting: *Deleted

## 2023-09-08 ENCOUNTER — Ambulatory Visit (HOSPITAL_BASED_OUTPATIENT_CLINIC_OR_DEPARTMENT_OTHER): Payer: 59 | Admitting: Infectious Diseases

## 2023-09-08 ENCOUNTER — Inpatient Hospital Stay
Admission: EM | Admit: 2023-09-08 | Discharge: 2023-10-06 | DRG: 423 | Disposition: A | Discharge status: Hospice | Payer: 59 | Attending: Student | Admitting: Student

## 2023-09-08 ENCOUNTER — Emergency Department: Payer: 59

## 2023-09-08 ENCOUNTER — Inpatient Hospital Stay (HOSPITAL_BASED_OUTPATIENT_CLINIC_OR_DEPARTMENT_OTHER): Payer: 59 | Admitting: Hospice and Palliative Medicine

## 2023-09-08 ENCOUNTER — Encounter: Payer: Self-pay | Admitting: Emergency Medicine

## 2023-09-08 VITALS — BP 124/84 | HR 105 | Temp 98.5°F | Resp 18 | Ht 66.0 in | Wt 157.7 lb

## 2023-09-08 DIAGNOSIS — B259 Cytomegaloviral disease, unspecified: Secondary | ICD-10-CM

## 2023-09-08 DIAGNOSIS — N839 Noninflammatory disorder of ovary, fallopian tube and broad ligament, unspecified: Secondary | ICD-10-CM | POA: Diagnosis present

## 2023-09-08 DIAGNOSIS — N83512 Torsion of left ovary and ovarian pedicle: Secondary | ICD-10-CM | POA: Diagnosis present

## 2023-09-08 DIAGNOSIS — K819 Cholecystitis, unspecified: Secondary | ICD-10-CM

## 2023-09-08 DIAGNOSIS — K759 Inflammatory liver disease, unspecified: Secondary | ICD-10-CM | POA: Insufficient documentation

## 2023-09-08 DIAGNOSIS — Z9221 Personal history of antineoplastic chemotherapy: Secondary | ICD-10-CM

## 2023-09-08 DIAGNOSIS — R7401 Elevation of levels of liver transaminase levels: Principal | ICD-10-CM

## 2023-09-08 DIAGNOSIS — R1900 Intra-abdominal and pelvic swelling, mass and lump, unspecified site: Secondary | ICD-10-CM

## 2023-09-08 DIAGNOSIS — I739 Peripheral vascular disease, unspecified: Secondary | ICD-10-CM

## 2023-09-08 DIAGNOSIS — D696 Thrombocytopenia, unspecified: Secondary | ICD-10-CM | POA: Diagnosis not present

## 2023-09-08 DIAGNOSIS — Y95 Nosocomial condition: Secondary | ICD-10-CM | POA: Diagnosis not present

## 2023-09-08 DIAGNOSIS — Z09 Encounter for follow-up examination after completed treatment for conditions other than malignant neoplasm: Secondary | ICD-10-CM | POA: Insufficient documentation

## 2023-09-08 DIAGNOSIS — N1330 Unspecified hydronephrosis: Secondary | ICD-10-CM | POA: Diagnosis present

## 2023-09-08 DIAGNOSIS — E8721 Acute metabolic acidosis: Secondary | ICD-10-CM | POA: Diagnosis not present

## 2023-09-08 DIAGNOSIS — R1114 Bilious vomiting: Secondary | ICD-10-CM

## 2023-09-08 DIAGNOSIS — Z66 Do not resuscitate: Secondary | ICD-10-CM | POA: Diagnosis not present

## 2023-09-08 DIAGNOSIS — A0839 Other viral enteritis: Secondary | ICD-10-CM

## 2023-09-08 DIAGNOSIS — E860 Dehydration: Secondary | ICD-10-CM

## 2023-09-08 DIAGNOSIS — F32A Depression, unspecified: Secondary | ICD-10-CM | POA: Diagnosis present

## 2023-09-08 DIAGNOSIS — R197 Diarrhea, unspecified: Secondary | ICD-10-CM

## 2023-09-08 DIAGNOSIS — C801 Malignant (primary) neoplasm, unspecified: Secondary | ICD-10-CM | POA: Insufficient documentation

## 2023-09-08 DIAGNOSIS — Z1152 Encounter for screening for COVID-19: Secondary | ICD-10-CM

## 2023-09-08 DIAGNOSIS — R112 Nausea with vomiting, unspecified: Secondary | ICD-10-CM | POA: Diagnosis present

## 2023-09-08 DIAGNOSIS — R1084 Generalized abdominal pain: Secondary | ICD-10-CM

## 2023-09-08 DIAGNOSIS — D630 Anemia in neoplastic disease: Secondary | ICD-10-CM | POA: Diagnosis present

## 2023-09-08 DIAGNOSIS — C779 Secondary and unspecified malignant neoplasm of lymph node, unspecified: Secondary | ICD-10-CM | POA: Diagnosis present

## 2023-09-08 DIAGNOSIS — Z9889 Other specified postprocedural states: Secondary | ICD-10-CM

## 2023-09-08 DIAGNOSIS — Z87891 Personal history of nicotine dependence: Secondary | ICD-10-CM

## 2023-09-08 DIAGNOSIS — Z923 Personal history of irradiation: Secondary | ICD-10-CM

## 2023-09-08 DIAGNOSIS — F419 Anxiety disorder, unspecified: Secondary | ICD-10-CM | POA: Diagnosis present

## 2023-09-08 DIAGNOSIS — I731 Thromboangiitis obliterans [Buerger's disease]: Secondary | ICD-10-CM | POA: Diagnosis present

## 2023-09-08 DIAGNOSIS — Z79891 Long term (current) use of opiate analgesic: Secondary | ICD-10-CM

## 2023-09-08 DIAGNOSIS — G629 Polyneuropathy, unspecified: Secondary | ICD-10-CM | POA: Diagnosis present

## 2023-09-08 DIAGNOSIS — N179 Acute kidney failure, unspecified: Secondary | ICD-10-CM | POA: Diagnosis not present

## 2023-09-08 DIAGNOSIS — Z5982 Transportation insecurity: Secondary | ICD-10-CM

## 2023-09-08 DIAGNOSIS — G893 Neoplasm related pain (acute) (chronic): Secondary | ICD-10-CM | POA: Diagnosis present

## 2023-09-08 DIAGNOSIS — C269 Malignant neoplasm of ill-defined sites within the digestive system: Secondary | ICD-10-CM | POA: Diagnosis present

## 2023-09-08 DIAGNOSIS — E222 Syndrome of inappropriate secretion of antidiuretic hormone: Secondary | ICD-10-CM | POA: Diagnosis present

## 2023-09-08 DIAGNOSIS — Z5986 Financial insecurity: Secondary | ICD-10-CM

## 2023-09-08 DIAGNOSIS — E8809 Other disorders of plasma-protein metabolism, not elsewhere classified: Secondary | ICD-10-CM | POA: Diagnosis present

## 2023-09-08 DIAGNOSIS — E876 Hypokalemia: Secondary | ICD-10-CM | POA: Diagnosis not present

## 2023-09-08 DIAGNOSIS — C7951 Secondary malignant neoplasm of bone: Secondary | ICD-10-CM | POA: Diagnosis present

## 2023-09-08 DIAGNOSIS — E875 Hyperkalemia: Secondary | ICD-10-CM | POA: Diagnosis not present

## 2023-09-08 DIAGNOSIS — Z79899 Other long term (current) drug therapy: Secondary | ICD-10-CM

## 2023-09-08 DIAGNOSIS — Z888 Allergy status to other drugs, medicaments and biological substances status: Secondary | ICD-10-CM

## 2023-09-08 DIAGNOSIS — I745 Embolism and thrombosis of iliac artery: Secondary | ICD-10-CM | POA: Diagnosis present

## 2023-09-08 DIAGNOSIS — C169 Malignant neoplasm of stomach, unspecified: Secondary | ICD-10-CM | POA: Diagnosis present

## 2023-09-08 DIAGNOSIS — Z978 Presence of other specified devices: Secondary | ICD-10-CM

## 2023-09-08 DIAGNOSIS — Z515 Encounter for palliative care: Secondary | ICD-10-CM

## 2023-09-08 DIAGNOSIS — C799 Secondary malignant neoplasm of unspecified site: Secondary | ICD-10-CM | POA: Insufficient documentation

## 2023-09-08 DIAGNOSIS — K529 Noninfective gastroenteritis and colitis, unspecified: Secondary | ICD-10-CM | POA: Diagnosis present

## 2023-09-08 DIAGNOSIS — K219 Gastro-esophageal reflux disease without esophagitis: Secondary | ICD-10-CM | POA: Diagnosis present

## 2023-09-08 DIAGNOSIS — I10 Essential (primary) hypertension: Secondary | ICD-10-CM | POA: Diagnosis present

## 2023-09-08 DIAGNOSIS — J189 Pneumonia, unspecified organism: Secondary | ICD-10-CM | POA: Diagnosis not present

## 2023-09-08 DIAGNOSIS — G894 Chronic pain syndrome: Secondary | ICD-10-CM | POA: Diagnosis present

## 2023-09-08 DIAGNOSIS — D72819 Decreased white blood cell count, unspecified: Secondary | ICD-10-CM | POA: Diagnosis present

## 2023-09-08 DIAGNOSIS — Z7982 Long term (current) use of aspirin: Secondary | ICD-10-CM

## 2023-09-08 DIAGNOSIS — C787 Secondary malignant neoplasm of liver and intrahepatic bile duct: Secondary | ICD-10-CM | POA: Diagnosis present

## 2023-09-08 DIAGNOSIS — A419 Sepsis, unspecified organism: Secondary | ICD-10-CM | POA: Diagnosis not present

## 2023-09-08 DIAGNOSIS — K5900 Constipation, unspecified: Secondary | ICD-10-CM | POA: Diagnosis not present

## 2023-09-08 DIAGNOSIS — I70262 Atherosclerosis of native arteries of extremities with gangrene, left leg: Secondary | ICD-10-CM

## 2023-09-08 DIAGNOSIS — K810 Acute cholecystitis: Secondary | ICD-10-CM | POA: Diagnosis not present

## 2023-09-08 DIAGNOSIS — J90 Pleural effusion, not elsewhere classified: Secondary | ICD-10-CM | POA: Diagnosis not present

## 2023-09-08 LAB — CBC WITH DIFFERENTIAL (CANCER CENTER ONLY)
Abs Immature Granulocytes: 0.26 10*3/uL — ABNORMAL HIGH (ref 0.00–0.07)
Basophils Absolute: 0 10*3/uL (ref 0.0–0.1)
Basophils Relative: 1 %
Eosinophils Absolute: 0 10*3/uL (ref 0.0–0.5)
Eosinophils Relative: 0 %
HCT: 24.9 % — ABNORMAL LOW (ref 36.0–46.0)
Hemoglobin: 8.1 g/dL — ABNORMAL LOW (ref 12.0–15.0)
Immature Granulocytes: 5 %
Lymphocytes Relative: 14 %
Lymphs Abs: 0.7 10*3/uL (ref 0.7–4.0)
MCH: 30 pg (ref 26.0–34.0)
MCHC: 32.5 g/dL (ref 30.0–36.0)
MCV: 92.2 fL (ref 80.0–100.0)
Monocytes Absolute: 0.3 10*3/uL (ref 0.1–1.0)
Monocytes Relative: 7 %
Neutro Abs: 3.6 10*3/uL (ref 1.7–7.7)
Neutrophils Relative %: 73 %
Platelet Count: 166 10*3/uL (ref 150–400)
RBC: 2.7 MIL/uL — ABNORMAL LOW (ref 3.87–5.11)
RDW: 18.7 % — ABNORMAL HIGH (ref 11.5–15.5)
WBC Count: 4.9 10*3/uL (ref 4.0–10.5)
nRBC: 5.1 % — ABNORMAL HIGH (ref 0.0–0.2)

## 2023-09-08 LAB — LIPASE, BLOOD: Lipase: 20 U/L (ref 11–51)

## 2023-09-08 LAB — CMP (CANCER CENTER ONLY)
ALT: 324 U/L (ref 0–44)
AST: 683 U/L (ref 15–41)
Albumin: 3.2 g/dL — ABNORMAL LOW (ref 3.5–5.0)
Alkaline Phosphatase: 1679 U/L — ABNORMAL HIGH (ref 38–126)
Anion gap: 9 (ref 5–15)
BUN: 9 mg/dL (ref 6–20)
CO2: 20 mmol/L — ABNORMAL LOW (ref 22–32)
Calcium: 8.9 mg/dL (ref 8.9–10.3)
Chloride: 101 mmol/L (ref 98–111)
Creatinine: 0.5 mg/dL (ref 0.44–1.00)
GFR, Estimated: >60 mL/min (ref >60)
Glucose, Bld: 108 mg/dL — ABNORMAL HIGH (ref 70–99)
Potassium: 3.7 mmol/L (ref 3.5–5.1)
Sodium: 130 mmol/L — ABNORMAL LOW (ref 135–145)
Total Bilirubin: 1.3 mg/dL — ABNORMAL HIGH (ref <1.2)
Total Protein: 7.2 g/dL (ref 6.5–8.1)

## 2023-09-08 LAB — HCG, QUANTITATIVE, PREGNANCY: hCG, Beta Chain, Quant, S: 126 m[IU]/mL — ABNORMAL HIGH (ref <5)

## 2023-09-08 LAB — LACTIC ACID, PLASMA: Lactic Acid, Venous: 0.7 mmol/L (ref 0.5–1.9)

## 2023-09-08 LAB — PROTIME-INR
INR: 1.2 (ref 0.8–1.2)
Prothrombin Time: 15 s (ref 11.4–15.2)

## 2023-09-08 LAB — MAGNESIUM: Magnesium: 1.9 mg/dL (ref 1.7–2.4)

## 2023-09-08 LAB — PREGNANCY, URINE: Preg Test, Ur: POSITIVE — AB

## 2023-09-08 MED ORDER — PIPERACILLIN-TAZOBACTAM 3.375 G IVPB
3.3750 g | Freq: Once | INTRAVENOUS | Status: AC
  Administered 2023-09-08: 3.375 g via INTRAVENOUS
  Filled 2023-09-08: qty 50

## 2023-09-08 MED ORDER — HEPARIN SOD (PORK) LOCK FLUSH 100 UNIT/ML IV SOLN
500.0000 [IU] | Freq: Once | INTRAVENOUS | Status: AC
  Filled 2023-09-08: qty 5

## 2023-09-08 MED ORDER — MORPHINE SULFATE (PF) 4 MG/ML IV SOLN: 4.0000 mg | Freq: Once | INTRAVENOUS | Status: DC

## 2023-09-08 MED ORDER — IOHEXOL 300 MG/ML  SOLN
100.0000 mL | Freq: Once | INTRAMUSCULAR | Status: AC | PRN
  Administered 2023-09-08: 100 mL via INTRAVENOUS

## 2023-09-08 MED ORDER — HYDROMORPHONE HCL 1 MG/ML IJ SOLN
1.0000 mg | Freq: Once | INTRAMUSCULAR | Status: AC
  Administered 2023-09-08: 1 mg via INTRAVENOUS
  Filled 2023-09-08: qty 1

## 2023-09-08 MED ORDER — SODIUM CHLORIDE 0.9% FLUSH
10.0000 mL | Freq: Once | INTRAVENOUS | Status: AC
  Administered 2023-09-08: 10 mL via INTRAVENOUS
  Filled 2023-09-08: qty 10

## 2023-09-08 MED ORDER — SODIUM CHLORIDE 0.9 % IV BOLUS
1000.0000 mL | Freq: Once | INTRAVENOUS | Status: AC
  Administered 2023-09-08: 1000 mL via INTRAVENOUS

## 2023-09-08 MED ORDER — PIPERACILLIN-TAZOBACTAM 3.375 G IVPB: 3.3750 g | Freq: Three times a day (TID) | INTRAVENOUS | Status: DC

## 2023-09-08 MED ORDER — ONDANSETRON HCL 4 MG/2ML IJ SOLN
4.0000 mg | Freq: Once | INTRAMUSCULAR | Status: AC
  Administered 2023-09-08: 4 mg via INTRAVENOUS
  Filled 2023-09-08: qty 2

## 2023-09-08 NOTE — Consult Note (Signed)
PHARMACY -  BRIEF ANTIBIOTIC NOTE   Pharmacy has received consult(s) for Zosyn from an ED provider.  The patient's profile has been reviewed for ht/wt/allergies/indication/available labs.    One time order(s) placed for  --Zosyn 3.375 g IV (4-hr infusion)  Further antibiotics/pharmacy consults should be ordered by admitting physician if indicated.                       Thank you, Tressie Ellis 09/08/2023  9:39 PM

## 2023-09-08 NOTE — Telephone Encounter (Signed)
Pt will come at 1pm for port labs/ iv fluids and smc-Josh

## 2023-09-08 NOTE — ED Provider Notes (Signed)
[facility] Provider Note    Event Date/Time   First MD Initiated Contact with Patient 09/08/23 1830     (approximate)   History   Abnormal Labs   HPI  Sheryl Silva is a 47 y.o. female here with abnormal labs.  The patient was sent from the cancer center.  She has had several days of nausea, poor appetite, and general abdominal pain.  She describes it as an aching, throbbing, diffuse pain.  The patient has known Gallstones and was treated medically for gallbladder disease in mid October.  Patient has a history of stage IV metastatic adenocarcinoma of the GI source.  She states her current pain is more severe than her usual pain.  Denies known fevers.  No urinary symptoms.     Physical Exam   Triage Vital Signs: ED Triage Vitals  Encounter Vitals Group     BP 09/08/23 1506 (!) 132/90     Systolic BP Percentile --      Diastolic BP Percentile --      Pulse Rate 09/08/23 1506 (!) 101     Resp 09/08/23 1506 18     Temp 09/08/23 1506 98.1 F (36.7 C)     Temp Source 09/08/23 1506 Oral     SpO2 09/08/23 1506 95 %     Weight 09/08/23 1507 156 lb (70.8 kg)     Height 09/08/23 1507 5\' 6"  (1.676 m)     Head Circumference --      Peak Flow --      Pain Score 09/08/23 1507 9     Pain Loc --      Pain Education --      Exclude from Growth Chart --     Most recent vital signs: Vitals:   09/08/23 2100 09/09/23 0012  BP: (!) 145/98 (!) 145/97  Pulse:  88  Resp:  20  Temp:  98 F (36.7 C)  SpO2:  96%     General: Awake, no distress.  CV:  Good peripheral perfusion.  Regular rate and rhythm. Resp:  Normal work of breathing.  Lungs clear to auscultation bilaterally. Abd:  No distention.  Moderate diffuse tenderness, with slight guarding.  No rebound.  No overt distention. Other:  Dry mucous membranes.   ED Results / Procedures / Treatments   Labs (all labs ordered are listed, but only abnormal results are displayed) Labs Reviewed   PREGNANCY, URINE - Abnormal; Notable for the following components:      Result Value   Preg Test, Ur POSITIVE (*)    All other components within normal limits  HCG, QUANTITATIVE, PREGNANCY - Abnormal; Notable for the following components:   hCG, Beta Chain, Quant, S 126 (*)    All other components within normal limits  CULTURE, BLOOD (ROUTINE X 2)  CULTURE, BLOOD (ROUTINE X 2)  PROTIME-INR  LACTIC ACID, PLASMA  LIPASE, BLOOD  COMPREHENSIVE METABOLIC PANEL  CBC  POC URINE PREG, ED     EKG    RADIOLOGY Ultrasound right upper quadrant: Cholelithiasis and cholecystitis, stable CBD CT abdomen/pelvis:Cholecystitis, new liver met, persistent diffuse metastatic diasese   I also independently reviewed and agree with radiologist interpretations.   PROCEDURES:  Critical Care performed: No   MEDICATIONS ORDERED IN ED: Medications  piperacillin-tazobactam (ZOSYN) IVPB 3.375 g (3.375 g Intravenous New Bag/Given 09/08/23 2232)  sodium chloride flush (NS) 0.9 % injection 3 mL (has no administration in time range)  acetaminophen (TYLENOL) tablet 650 mg (has no  administration in time range)    Or  acetaminophen (TYLENOL) suppository 650 mg (has no administration in time range)  morphine (PF) 2 MG/ML injection 2 mg (has no administration in time range)  pantoprazole (PROTONIX) injection 40 mg (has no administration in time range)  albuterol (PROVENTIL) (2.5 MG/3ML) 0.083% nebulizer solution 2.5 mg (has no administration in time range)  gabapentin (NEURONTIN) capsule 600 mg (has no administration in time range)  DULoxetine (CYMBALTA) DR capsule 60 mg (has no administration in time range)  ondansetron (ZOFRAN) 8 mg in sodium chloride 0.9 % 50 mL IVPB (has no administration in time range)  ALPRAZolam (XANAX) tablet 0.5 mg (has no administration in time range)  sodium chloride 0.9 % bolus 1,000 mL (0 mLs Intravenous Stopped 09/08/23 2347)  ondansetron (ZOFRAN) injection 4 mg (4 mg  Intravenous Given 09/08/23 1942)  HYDROmorphone (DILAUDID) injection 1 mg (1 mg Intravenous Given 09/08/23 1942)  iohexol (OMNIPAQUE) 300 MG/ML solution 100 mL (100 mLs Intravenous Contrast Given 09/08/23 2018)  HYDROmorphone (DILAUDID) injection 1 mg (1 mg Intravenous Given 09/08/23 2126)  HYDROmorphone (DILAUDID) injection 1 mg (1 mg Intravenous Given 09/08/23 2347)     IMPRESSION / MDM / ASSESSMENT AND PLAN / ED COURSE  I reviewed the triage vital signs and the nursing notes.                              Differential diagnosis includes, but is not limited to, recurrent cholecystitis, choledocholithiasis, worsening metastatic abdominal cancer, UTI, pyelonephritis, colitis  Patient's presentation is most consistent with acute presentation with potential threat to life or bodily function.  The patient is on the cardiac monitor to evaluate for evidence of arrhythmia and/or significant heart rate changes  47 year old female with history of metastatic signet cell carcinoma here with abdominal pain and positive liver enzymes.  Patient recently admitted and medically treated for acute cholecystitis.  She was seen at an outside facility and had lab work showing marked transaminitis.  Reviewed the patient's labs.  CBC shows no leukocytosis.  Hemoglobin is at baseline.  Platelets are at baseline.  CMP shows AST of 683, ALT 324, alk phos 1700, and total bili of 1.3.  This is significantly different from her numbers just 10 days ago.  Ultrasound obtained, shows cholecystitis, also stable dilated common bile duct.  CT scan pending.  Broad-spectrum antibiotics started.  Imaging shows cholecystitis, new liver mets, diffuse bony dz. Discussed with Dr. Tonna Boehringer who will see pt, agrees with IV ABX and medicine admission at this time.  FINAL CLINICAL IMPRESSION(S) / ED DIAGNOSES   Final diagnoses:  Transaminitis  Generalized abdominal pain  Metastatic signet ring cell carcinoma (HCC)  Cholecystitis     Rx /  DC Orders   ED Discharge Orders     None        Note:  This document was prepared using Dragon voice recognition software and may include unintentional dictation errors.   Shaune Pollack, MD 09/09/23 [phone]

## 2023-09-08 NOTE — Telephone Encounter (Signed)
Dr. Rivka Safer spoke with the patient and discussed labs

## 2023-09-08 NOTE — Progress Notes (Signed)
Patient concerned about nausea vomiting and diarrhea. Not taking imodium at this time. She is also concerned about increase in anxiety. She has been out of her Buspar for several days.

## 2023-09-08 NOTE — ED Triage Notes (Signed)
Patient to ED from cancer center for abnormal labs- liver enzymes elevated. AST 683 and ALT 324. Having nausea and diarrhea x3 days. Last chemo tx 2 months ago.

## 2023-09-08 NOTE — Telephone Encounter (Signed)
Rn spoke with [facility] in cancer center lab - 1430- AST 683 and ALT 324. Read back process performed with critical value. Elouise Munroe, NP informed of critical value. - [phone]  Patient needs to be transported to the ER per v/o Josh, NP.

## 2023-09-08 NOTE — H&P (Addendum)
History and Physical    Patient: Sheryl Silva UJW:[id number] DOB: Mar 29, 1976 DOA: 09/08/2023 DOS: the patient was seen and examined on 09/08/2023 PCP: [name], MD  Patient coming from: Home  Chief Complaint:  Chief Complaint  Patient presents with   Abnormal Labs    HPI: Sheryl Silva is a 47 y.o. 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 In chart review, per ID note patient has metastatic signet ring adenocarcinoma was on FOLFOX and Keytruda and received 7 cycles and palliative radiation therapy.  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 If pt is not chemo then she may not need valganciclovir. Otherwise she will need suppressive therapy.   In emergency room vitals trend shows: Vitals:   09/08/23 1930 09/08/23 2000 09/08/23 2045 09/08/23 2100  BP: (!) 153/93 (!) 135/96 (!) 144/94 (!) 145/98  Pulse:      Temp:      Resp:      Height:      Weight:      SpO2:      TempSrc:      BMI (Calculated):      Labs are notable for hyponatremia of 130 bicarb of 20 glucose 108 alk phos 1679 albumin 3.2 AST 683 ALT 324 total bili 1.3 normal lactic acid, normal lipase, anemia with a hemoglobin of 8.1 RDW of 18.7 normal white count and normal platelet count. CT imaging done today shows worsening cholecystitis as seen on CT with worsening  wall thickening and intramural edema, new liver metastasis, persistent metastatic disease, new mild left hydronephrosis. Case was discussed with general surgery by EDMD who said that they will consult on the patient.  In the ED pt received: Medications  piperacillin-tazobactam (ZOSYN) IVPB 3.375 g (3.375 g Intravenous New Bag/Given 09/08/23 2232)  sodium chloride 0.9 % bolus 1,000 mL (1,000 mLs Intravenous New Bag/Given 09/08/23 1953)  ondansetron (ZOFRAN) injection 4 mg (4 mg Intravenous Given 09/08/23 1942)  HYDROmorphone (DILAUDID) injection 1 mg (1 mg Intravenous Given 09/08/23 1942)  iohexol (OMNIPAQUE) 300 MG/ML solution 100 mL (100 mLs Intravenous Contrast Given 09/08/23 2018)  HYDROmorphone (DILAUDID) injection 1 mg (1 mg Intravenous Given 09/08/23 2126)   Review of Systems  Gastrointestinal:  Positive for abdominal pain, nausea and vomiting.   Past Medical History:  Diagnosis Date   Anxiety    Cancer (HCC)    Depression    Heart murmur    Hypertension    Past Surgical History:  Procedure Laterality Date   BIOPSY  07/26/2023   Procedure: BIOPSY;  Surgeon: Toney Reil, MD;  Location: [facility] ENDOSCOPY;  Service: Gastroenterology;;   CESAREAN SECTION     COLONOSCOPY WITH PROPOFOL N/A 02/21/2023   Procedure: COLONOSCOPY WITH PROPOFOL;  Surgeon: Regis Bill, MD;  Location: [facility] ENDOSCOPY;  Service: Endoscopy;  Laterality: N/A;   DILATION AND CURETTAGE OF UTERUS     FLEXIBLE SIGMOIDOSCOPY N/A 07/26/2023   Procedure: FLEXIBLE SIGMOIDOSCOPY;  Surgeon: Toney Reil, MD;  Location: [facility] ENDOSCOPY;  Service: Gastroenterology;  Laterality: N/A;   IR IMAGING GUIDED PORT INSERTION  02/16/2023    reports that she quit smoking about 8 months ago. Her smoking use included cigarettes. She has never used smokeless tobacco. She reports current alcohol use. She reports that she does not use drugs.  Allergies  Allergen Reactions   Nifedipine Palpitations    Family History   Problem Relation Age of Onset   Cancer Maternal Grandmother     Prior to Admission medications   Medication Sig Start Date End Date Taking? Authorizing Provider  albuterol (VENTOLIN HFA) 108 (90 Base) MCG/ACT inhaler Inhale 2 puffs into the lungs every 6 (six) hours as needed for wheezing or shortness of breath.    [provider]  amLODipine (NORVASC) 10 MG tablet Take 10 mg by mouth daily.    [provider]  cyanocobalamin (VITAMIN B12) 500 MCG tablet Take 500 mcg by mouth daily.    [provider]  cyclobenzaprine (FLEXERIL) 10 MG tablet Take 10 mg by mouth at bedtime as needed for muscle spasms.    [provider]  Doxepin HCl 6 MG TABS Take 6 mg by mouth at bedtime.    [provider]  DULoxetine (CYMBALTA) 60 MG capsule Take 60 mg by mouth daily.    [provider]  feeding supplement (ENSURE ENLIVE / ENSURE PLUS) LIQD Take 237 mLs by mouth 3 (three) times daily between meals. 08/04/23   Enedina Finner, MD  gabapentin (NEURONTIN) 300 MG capsule Take 2 capsules (600 mg total) by mouth 3 (three) times daily. 08/29/23   Jeralyn Ruths, MD  hydrocortisone 2.5 % cream Apply 1 Application topically 2 (two) times daily as needed (skin irritation).    [provider]  lidocaine-prilocaine (EMLA) cream APPLY TO AFFECTED AREA ONCE AS DIRECTED 08/31/23   Jeralyn Ruths, MD  megestrol (MEGACE) 40 MG tablet TAKE 1 TABLET BY MOUTH EVERY DAY 06/27/23   Jeralyn Ruths, MD  Multiple Vitamin (MULTIVITAMIN) tablet Take 1 tablet by mouth daily.    [provider]  naloxone [facility]) nasal spray 4 mg/0.1 mL Place 1 spray into the nose once. Patient not taking: Reported on 09/08/2023 02/09/23   [provider]  ondansetron (ZOFRAN) 8 MG tablet Take 1 tablet (8 mg total) by mouth every 8 (eight) hours as needed for nausea or vomiting. Start on the third day after chemotherapy. 02/25/23   Jeralyn Ruths, MD   oxyCODONE-acetaminophen (PERCOCET) 10-325 MG tablet Take 1 tablet by mouth every 6 (six) hours as needed for pain.    [provider]  polyethylene glycol (MIRALAX / GLYCOLAX) 17 g packet Take 17 g by mouth daily as needed. Patient not taking: Reported on 09/08/2023 08/04/23   Enedina Finner, MD  prochlorperazine (COMPAZINE) 10 MG tablet Take 1 tablet (10 mg total) by mouth every 6 (six) hours as needed for nausea or vomiting. 02/25/23   Jeralyn Ruths, MD  traMADol (ULTRAM-ER) 300 MG 24 hr tablet Take 300 mg by mouth daily.    [provider]  valGANciclovir (VALCYTE) 450 MG tablet Take 450 mg by mouth 2 (two) times daily.    [provider]  Vitamin D, Ergocalciferol, (DRISDOL) 1.25 MG (50000 UNIT) CAPS capsule Take 50,000 Units by mouth every 7 (seven) days.    [provider]   Vitals:   09/08/23 1930 09/08/23 2000 09/08/23 2045 09/08/23 2100  BP: (!) 153/93 (!) 135/96 (!) 144/94 (!) 145/98  Pulse:  Resp:      Temp:      TempSrc:      SpO2:      Weight:      Height:       Physical Exam Vitals and nursing note reviewed.  Constitutional:      General: She is not in acute distress. HENT:     Head: Normocephalic and atraumatic.     Right Ear: Hearing and external ear normal.     Left Ear: Hearing and external ear normal.     Nose: Nose normal. No nasal deformity.     Mouth/Throat:     Lips: Pink.     Tongue: No lesions.     Pharynx: Oropharynx is clear.  Eyes:     General: Lids are normal.     Extraocular Movements: Extraocular movements intact.  Cardiovascular:     Rate and Rhythm: Normal rate and regular rhythm.     Heart sounds: Normal heart sounds.  Pulmonary:     Effort: Pulmonary effort is normal.     Breath sounds: Normal breath sounds.  Abdominal:     General: Bowel sounds are normal. There is no distension.     Palpations: Abdomen is soft. There is no mass.     Tenderness: There is no abdominal tenderness.   Musculoskeletal:     Right lower leg: No edema.     Left lower leg: No edema.  Skin:    General: Skin is warm.  Neurological:     General: No focal deficit present.     Mental Status: She is alert and oriented to person, place, and time.     Cranial Nerves: Cranial nerves 2-12 are intact.  Psychiatric:        Attention and Perception: Attention normal.        Mood and Affect: Mood normal.        Speech: Speech normal.        Behavior: Behavior normal. Behavior is cooperative.   Labs on Admission: I have personally reviewed following labs and imaging studies Results for orders placed or performed during the hospital encounter of 09/08/23 (from the past 24 hour(s))  Pregnancy, urine     Status: Abnormal   Collection Time: 09/08/23  6:27 PM  Result Value Ref Range   Preg Test, Ur POSITIVE (A) NEGATIVE  Protime-INR     Status: None   Collection Time: 09/08/23  7:48 PM  Result Value Ref Range   Prothrombin Time 15.0 11.4 - 15.2 seconds   INR 1.2 0.8 - 1.2  Lactic acid, plasma     Status: None   Collection Time: 09/08/23  7:48 PM  Result Value Ref Range   Lactic Acid, Venous 0.7 0.5 - 1.9 mmol/L  hCG, quantitative, pregnancy     Status: Abnormal   Collection Time: 09/08/23  9:17 PM  Result Value Ref Range   hCG, Beta Chain, Quant, S 126 (H) <5 mIU/mL  Lipase, blood     Status: None   Collection Time: 09/08/23  9:17 PM  Result Value Ref Range   Lipase 20 11 - 51 U/L   CBC: Recent Labs  Lab 09/08/23 1332  WBC 4.9  NEUTROABS 3.6  HGB 8.1*  HCT 24.9*  MCV 92.2  PLT 166   Basic Metabolic Panel: Recent Labs  Lab 09/08/23 1332  NA 130*  K 3.7  CL 101  CO2 20*  GLUCOSE 108*  BUN 9  CREATININE 0.50  CALCIUM 8.9  MG  1.9   GFR: Estimated Creatinine Clearance: 80.5 mL/min (by C-G formula based on SCr of 0.5 mg/dL). Liver Function Tests: Recent Labs  Lab 09/08/23 1332  AST 683*  ALT 324*  ALKPHOS 1,679*  BILITOT 1.3*  PROT 7.2  ALBUMIN 3.2*   Recent Labs   Lab 09/08/23 2117  LIPASE 20   No results for input(s): "AMMONIA" in the last 168 hours. Coagulation Profile: Recent Labs  Lab 09/08/23 1948  INR 1.2   Cardiac Enzymes: No results for input(s): "CKTOTAL", "CKMB", "CKMBINDEX", "TROPONINI" in the last 168 hours. BNP (last 3 results) No results for input(s): "PROBNP" in the last 8760 hours. HbA1C: No results for input(s): "HGBA1C" in the last 72 hours. CBG: No results for input(s): "GLUCAP" in the last 168 hours. Lipid Profile: No results for input(s): "CHOL", "HDL", "LDLCALC", "TRIG", "CHOLHDL", "LDLDIRECT" in the last 72 hours. Thyroid Function Tests: No results for input(s): "TSH", "T4TOTAL", "FREET4", "T3FREE", "THYROIDAB" in the last 72 hours. Anemia Panel: No results for input(s): "VITAMINB12", "FOLATE", "FERRITIN", "TIBC", "IRON", "RETICCTPCT" in the last 72 hours. Urinalysis    Component Value Date/Time   COLORURINE YELLOW (A) 07/22/2023 0349   APPEARANCEUR HAZY (A) 07/22/2023 0349   LABSPEC >1.046 (H) 07/22/2023 0349   PHURINE 6.0 07/22/2023 0349   GLUCOSEU NEGATIVE 07/22/2023 0349   HGBUR NEGATIVE 07/22/2023 0349   BILIRUBINUR NEGATIVE 07/22/2023 0349   KETONESUR NEGATIVE 07/22/2023 0349   PROTEINUR NEGATIVE 07/22/2023 0349   NITRITE NEGATIVE 07/22/2023 0349   LEUKOCYTESUR NEGATIVE 07/22/2023 0349   Unresulted Labs (From admission, onward)     Start     Ordered   09/08/23 1841  Blood culture (routine x 2)  BLOOD CULTURE X 2,   STAT      09/08/23 1841           Medications  piperacillin-tazobactam (ZOSYN) IVPB 3.375 g (3.375 g Intravenous New Bag/Given 09/08/23 2232)  sodium chloride 0.9 % bolus 1,000 mL (1,000 mLs Intravenous New Bag/Given 09/08/23 1953)  ondansetron (ZOFRAN) injection 4 mg (4 mg Intravenous Given 09/08/23 1942)  HYDROmorphone (DILAUDID) injection 1 mg (1 mg Intravenous Given 09/08/23 1942)  iohexol (OMNIPAQUE) 300 MG/ML solution 100 mL (100 mLs Intravenous Contrast Given 09/08/23 2018)   HYDROmorphone (DILAUDID) injection 1 mg (1 mg Intravenous Given 09/08/23 2126)    Radiological Exams on Admission: CT ABDOMEN PELVIS W CONTRAST  Result Date: 09/08/2023 CLINICAL DATA:  Elevated liver enzymes, nausea and diarrhea for 3 days, cholelithiasis with acute cholecystitis on previous imaging, history of metastatic adrenal carcinoma EXAM: CT ABDOMEN AND PELVIS WITH CONTRAST TECHNIQUE: Multidetector CT imaging of the abdomen and pelvis was performed using the standard protocol following bolus administration of intravenous contrast. RADIATION DOSE REDUCTION: This exam was performed according to the departmental dose-optimization program which includes automated exposure control, adjustment of the mA and/or kV according to patient size and/or use of iterative reconstruction technique. CONTRAST:  OMNIPAQUE IOHEXOL 300 MG/ML  SOLN COMPARISON:  08/21/2023, 08/20/2023 FINDINGS: Lower chest: Stable bibasilar hypoventilatory changes and scarring. No acute pleural or parenchymal lung disease. Hepatobiliary: There is a new 1 cm hypodensity within the left lobe liver, corresponding to hypoechoic lesion seen on today's ultrasound. Given history of metastatic adrenal carcinoma, new metastatic lesion is suspected. Multiple gallstones are again identified, with progressive gallbladder wall thickening and intramural edema consistent with acute cholecystitis. No evidence of choledocholithiasis. Pancreas: Unremarkable. No pancreatic ductal dilatation or surrounding inflammatory changes. Spleen: Normal in size without focal abnormality. Adrenals/Urinary Tract: The adrenals are unremarkable.  The kidneys enhance normally. There is new mild left-sided hydronephrosis, with mucosal enhancement at the left UPJ suggesting obstructing mucosal lesion, reference image 43/2. No right-sided obstruction. No urinary tract calculi. The bladder is unremarkable. Stomach/Bowel: No bowel obstruction or ileus. Normal appendix right  lower quadrant. No bowel wall thickening or inflammatory change. Vascular/Lymphatic: Stable atherosclerosis of the aorta. There is persistent retroperitoneal lymphadenopathy consistent with metastatic disease. Index left para-aortic lymph node image 44/2 measures 8 mm in short axis, stable. No new adenopathy. Reproductive: 5.1 x 3.9 cm left adnexal mass not appreciably changed since prior exam. Right ovary is unremarkable and stable. Stable uterine fibroid. Other: Trace pelvic free fluid unchanged. No free intraperitoneal gas. No abdominal wall hernia. Musculoskeletal: Diffuse sclerosis throughout the visualized bony structures compatible with bony metastases. No acute or pathologic fracture. Reconstructed images demonstrate no additional findings. IMPRESSION: 1. Cholelithiasis with progressive gallbladder wall thickening and intramural edema consistent with worsening acute cholecystitis. 2. New 1 cm hypodensity within the left lobe liver, concerning for new liver metastasis. This was not evident on recent CT or MRI. 3. Persistent metastatic disease, with diffuse sclerotic bony metastases and retroperitoneal adenopathy unchanged. 4. Stable indeterminate left adnexal mass. 5. New mild left hydronephrosis, with subtle mucosal enhancement within the left ureter at the UPJ. Mucosal lesion cannot be excluded, and urologic consultation may be useful. 6.  Aortic Atherosclerosis (ICD10-I70.0). Electronically Signed   By: Sharlet Salina M.D.   On: 09/08/2023 21:43   US Abdomen Limited RUQ (LIVER/GB)  Result Date: 09/08/2023 CLINICAL DATA:  Quadrant pain for 3 days EXAM: ULTRASOUND ABDOMEN LIMITED RIGHT UPPER QUADRANT COMPARISON:  08/20/2023 FINDINGS: Gallbladder: Multiple shadowing gallstones are seen filling the gallbladder lumen. Gallbladder wall remains thickened measuring up to 8 mm, with prominent intramural edema now noted. Negative sonographic Murphy sign. Common bile duct: Diameter: 11 mm Liver: Normal liver  echotexture. There is a circumscribed 1.3 cm hypoechoic area within the left lobe liver, without associated abnormality on prior CT or MRI. Portal vein is patent on color Doppler imaging with normal direction of blood flow towards the liver. Other: None. IMPRESSION: 1. Cholelithiasis, with worsening gallbladder wall thickening compatible with progressive acute cholecystitis. 2. Stable dilated common bile duct. 3. Indeterminate 1.3 cm hypoechoic area left lobe liver, not clearly seen on previous CT or MRI. Electronically Signed   By: Sharlet Salina M.D.   On: 09/08/2023 20:59     Data Reviewed: Relevant notes from primary care and specialist visits, past discharge summaries as available in EHR, including Care Everywhere. Prior diagnostic testing as pertinent to current admission diagnoses Updated medications and problem lists for reconciliation ED course, including vitals, labs, imaging, treatment and response to treatment Triage notes, nursing and pharmacy notes and ED provider's notes Notable results as noted in HPI  Assessment and Plan: >>Abdominal Pain: 2/2 acute cholecystitis and metastatic gastric cancer.  Admit to med tele.  Cont with supportive care. PRN pain control.  > Stage IV metastatic signet ring adenocarcinoma: Oncology consult as deemed appropriate.  Pain control/ supportive care.    >>H/O CMV colitis: Pt was on valtrex and will see if she is still on chemo meds verification.  Pt states she was on it and was taken off it today.  Pt states she is suppose to take it on Monday.   >>Elevated LFT: 2/2 acute cholecystitis. Conservative and supportive care / t/t plan.  MIVF. Diet as tolerated. NPO after MN except for meds.    >>Anemia: Based on trend suspect  IRON loss anemia. ? If her c/h anemia was worse with her recent Colitis and possible blood loss, HB is currently stable. Type/ screen.  Follow.    DVT prophylaxis:  SCD's   Consults:  General surgery: Dr.  Tonna Boehringer.  Advance Care Planning:    Code Status: Prior   Family Communication:  None   Disposition Plan:  TBD.  Severity of Illness: The appropriate patient status for this patient is INPATIENT. Inpatient status is judged to be reasonable and necessary in order to provide the required intensity of service to ensure the patient's safety. The patient's presenting symptoms, physical exam findings, and initial radiographic and laboratory data in the context of their chronic comorbidities is felt to place them at high risk for further clinical deterioration. Furthermore, it is not anticipated that the patient will be medically stable for discharge from the hospital within 2 midnights of admission.   * I certify that at the point of admission it is my clinical judgment that the patient will require inpatient hospital care spanning beyond 2 midnights from the point of admission due to high intensity of service, high risk for further deterioration and high frequency of surveillance required.*  Author: Gertha Calkin, MD 09/08/2023 11:13 PM  For on call review [email or web address].

## 2023-09-08 NOTE — Telephone Encounter (Signed)
Patient called reporting that she is not feeling well for a couple of days now. She said that it started with weakness and and in general not feeling well and she developed watery diarrhea yesterday which is watery in consistency and she has had 3 since last evening. She is sweating but denies fever. She has a headache and not wanting to eat. She is asking if we can do anything for her symptoms. Please advise

## 2023-09-08 NOTE — Telephone Encounter (Signed)
-----   Message from Lynn Ito sent at 09/07/2023 12:08 PM EST ----- Dr.Finnegan, is this patient on chemo? I have not een her since her discharge and hope to speak to her tomorrow- if her diarrhea has  resolved and if she is not on chemo I would stop the valganciclovir- ----- Message ----- From: Jeralyn Ruths, MD Sent: 08/30/2023   2:42 PM EST To: Lynn Ito, MD   ----- Message ----- From: Leory Plowman, Lab In [place] Sent: 08/29/2023   8:38 AM EDT To: Jeralyn Ruths, MD

## 2023-09-08 NOTE — Progress Notes (Signed)
The purpose of this virtual visit is to provide medical care while limiting exposure to the novel coronavirus (COVID19) for both patient and office staff.   Consent was obtained for phone visit:  Yes.   Answered questions that patient had about telehealth interaction:  Yes.   I discussed the limitations, risks, security and privacy concerns of performing an evaluation and management service by telephone. I also discussed with the patient that there may be a patient responsible charge related to this service. The patient expressed understanding and agreed to proceed.   Patient Location: [facility] Location: office Follow up visit for CMV colitis She has metastatic signet ring  adenocarcinoma possible GI origin was on  FOLFOX plus Keytruda received 7 cycles and palliative radiation therapy  She was recently in hospital in Sept for diarrhea, weakness and abdominal pain and colonoscopy showed rectosigmoid colitis . CMV related changes were seen on the biopsy and I saw her then and started IV ganciclovir. She was also on high dose prednisone started for possible autoimmune colitis due to [facility]. After nearly a week or more of IV , once CMV DNA in blood was not countable she was swithced to PO valganciclovir 900mg  BID for 3 more weeks. She was discharge don 08/04/23. She says she has had trouble taking the medication- she has missed doses-  She has not started chemo therapy  A repeat CMV DNA on 10/14 and 10/28 was negative IF she is not going to restart chemo she may not need further treatment especially with having pancytopenia which is stable in 2 weeks  10/28 WBC 3.3, HB 8.3, PLT 132 But if she plans to start chemo she will have to be on suppressive therapy with 900mg  VALGanciclovir every day Discussed with patient  in detail Discussed with Dr.Finnegan  Total time spent on this call- 10 min  P.S She went to Cancer center this afternoon and labs drawn- has abnormal LFTS and she has been asked  to go to ED by the oncology team. Hold ganciclovir and check CMV DNA

## 2023-09-08 NOTE — Progress Notes (Signed)
Symptom Management Clinic [facility] at [facility] Telephone:([phone]) [phone] [phone]  Patient Care Team: [name], MD as PCP - General (Internal Medicine) Jim Like, RN as Registered Nurse Scarlett Presto, RN (Inactive) as Registered Nurse Benita Gutter, RN as Oncology Nurse Navigator Jeralyn Ruths, MD as Consulting Physician (Oncology) Johnnette Barrios, RN as [facility] Care Management   NAME OF PATIENT: Sheryl Silva  [id number]  [birth year]   DATE OF VISIT: 09/08/23  REASON FOR CONSULT: Jochebed Bills is a 47 y.o. 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   INTERVAL HISTORY: Patient was hospitalized 08/21/2023 to 08/23/2023 with cholecystitis, which was conservatively managed with IV antibiotics.  Also with recent hospitalization for CMV colitis and has been managed on valganciclovir by ID.  Presents to [facility] today with complaint of several days of profuse diarrhea and generalized abdominal pain.  Denies fever or chills.  Says that she has had 7 loose, watery stools in the past 24 hours.  Has had nausea but no vomiting.  Reports poor oral intake.  Denies respiratory or urinary complaints.  PAST MEDICAL HISTORY: Past Medical History:  Diagnosis Date   Anxiety    Cancer (HCC)    Depression    Heart murmur    Hypertension     PAST SURGICAL HISTORY:  Past Surgical History:  Procedure Laterality Date   BIOPSY  07/26/2023   Procedure: BIOPSY;  Surgeon: Toney Reil, MD;  Location: [facility] ENDOSCOPY;  Service: Gastroenterology;;   CESAREAN SECTION     COLONOSCOPY WITH PROPOFOL N/A 02/21/2023   Procedure: COLONOSCOPY WITH PROPOFOL;  Surgeon: Regis Bill, MD;  Location: [facility] ENDOSCOPY;  Service: Endoscopy;  Laterality: N/A;   DILATION AND CURETTAGE OF UTERUS     FLEXIBLE SIGMOIDOSCOPY N/A 07/26/2023   Procedure: FLEXIBLE SIGMOIDOSCOPY;   Surgeon: Toney Reil, MD;  Location: [facility] ENDOSCOPY;  Service: Gastroenterology;  Laterality: N/A;   IR IMAGING GUIDED PORT INSERTION  02/16/2023    HEMATOLOGY/ONCOLOGY HISTORY:  Oncology History  Signet ring cell adenocarcinoma (HCC)  02/07/2023 Cancer Staging   Staging form: Exocrine Pancreas, AJCC 8th Edition - Clinical stage from 02/07/2023: Stage IV (cTX, cNX, pM1) - Signed by Jeralyn Ruths, MD on 02/14/2023 Stage prefix: Initial diagnosis   02/14/2023 Initial Diagnosis   Signet ring cell adenocarcinoma   03/02/2023 -  Chemotherapy   Patient is on Treatment Plan : GI ORIGIN FOLFOX+KEYTRUDA q21d x12 followed by Gwenlyn Fudge       ALLERGIES:  is allergic to nifedipine.  MEDICATIONS:  Current Outpatient Medications  Medication Sig Dispense Refill   albuterol (VENTOLIN HFA) 108 (90 Base) MCG/ACT inhaler Inhale 2 puffs into the lungs every 6 (six) hours as needed for wheezing or shortness of breath.     amLODipine (NORVASC) 10 MG tablet Take 10 mg by mouth daily.     cyanocobalamin (VITAMIN B12) 500 MCG tablet Take 500 mcg by mouth daily.     cyclobenzaprine (FLEXERIL) 10 MG tablet Take 10 mg by mouth at bedtime as needed for muscle spasms.     Doxepin HCl 6 MG TABS Take 6 mg by mouth at bedtime.     DULoxetine (CYMBALTA) 60 MG capsule Take 60 mg by mouth daily.     feeding supplement (ENSURE ENLIVE / ENSURE PLUS) LIQD Take 237 mLs by mouth 3 (three) times daily between meals. 237 mL 12   gabapentin (NEURONTIN) 300 MG capsule  Take 2 capsules (600 mg total) by mouth 3 (three) times daily. 180 capsule 0   hydrocortisone 2.5 % cream Apply 1 Application topically 2 (two) times daily as needed (skin irritation).     lidocaine-prilocaine (EMLA) cream APPLY TO AFFECTED AREA ONCE AS DIRECTED 30 g 3   megestrol (MEGACE) 40 MG tablet TAKE 1 TABLET BY MOUTH EVERY DAY 30 tablet 2   Multiple Vitamin (MULTIVITAMIN) tablet Take 1 tablet by mouth daily.     ondansetron (ZOFRAN) 8  MG tablet Take 1 tablet (8 mg total) by mouth every 8 (eight) hours as needed for nausea or vomiting. Start on the third day after chemotherapy. 60 tablet 2   oxyCODONE-acetaminophen (PERCOCET) 10-325 MG tablet Take 1 tablet by mouth every 6 (six) hours as needed for pain.     prochlorperazine (COMPAZINE) 10 MG tablet Take 1 tablet (10 mg total) by mouth every 6 (six) hours as needed for nausea or vomiting. 60 tablet 2   traMADol (ULTRAM-ER) 300 MG 24 hr tablet Take 300 mg by mouth daily.     Vitamin D, Ergocalciferol, (DRISDOL) 1.25 MG (50000 UNIT) CAPS capsule Take 50,000 Units by mouth every 7 (seven) days.     naloxone (NARCAN) nasal spray 4 mg/0.1 mL Place 1 spray into the nose once. (Patient not taking: Reported on 09/08/2023)     polyethylene glycol (MIRALAX / GLYCOLAX) 17 g packet Take 17 g by mouth daily as needed. (Patient not taking: Reported on 09/08/2023) 14 each 0   No current facility-administered medications for this visit.   Facility-Administered Medications Ordered in Other Visits  Medication Dose Route Frequency Provider Last Rate Last Admin   heparin lock flush 100 unit/mL  500 Units Intravenous Once Uchenna Seufert, Daryl Eastern, NP       sodium chloride flush (NS) 0.9 % injection 10 mL  10 mL Intracatheter PRN Jeralyn Ruths, MD   10 mL at 06/29/23 1430    VITAL SIGNS: BP 124/84   Pulse (!) 105   Temp 98.5 F (36.9 C) (Tympanic)   Resp 18   Ht 5\' 6"  (1.676 m)   Wt 157 lb 11.2 oz (71.5 kg)   BMI 25.45 kg/m  Filed Weights   09/08/23 1330  Weight: 157 lb 11.2 oz (71.5 kg)    Estimated body mass index is 25.45 kg/m as calculated from the following:   Height as of this encounter: 5\' 6"  (1.676 m).   Weight as of this encounter: 157 lb 11.2 oz (71.5 kg).  LABS: CBC:    Component Value Date/Time   WBC 4.9 09/08/2023 1332   WBC 3.7 (L) 08/23/2023 0417   HGB 8.1 (L) 09/08/2023 1332   HCT 24.9 (L) 09/08/2023 1332   PLT 166 09/08/2023 1332   MCV 92.2 09/08/2023 1332    NEUTROABS 3.6 09/08/2023 1332   LYMPHSABS 0.7 09/08/2023 1332   MONOABS 0.3 09/08/2023 1332   EOSABS 0.0 09/08/2023 1332   BASOSABS 0.0 09/08/2023 1332   Comprehensive Metabolic Panel:    Component Value Date/Time   NA 130 (L) 08/29/2023 0825   K 3.6 08/29/2023 0825   CL 101 08/29/2023 0825   CO2 22 08/29/2023 0825   BUN 8 08/29/2023 0825   CREATININE 0.54 08/29/2023 0825   GLUCOSE 88 08/29/2023 0825   CALCIUM 8.5 (L) 08/29/2023 0825   AST 34 08/29/2023 0825   ALT 22 08/29/2023 0825   ALKPHOS 399 (H) 08/29/2023 0825   BILITOT 0.7 08/29/2023 0825   PROT 6.8  08/29/2023 0825   ALBUMIN 3.1 (L) 08/29/2023 0825    RADIOGRAPHIC STUDIES: MR ABDOMEN MRCP W WO CONTAST  Result Date: 08/21/2023 CLINICAL DATA:  47 year old female with history of transaminitis and abdominal pain. Gallstones. Evaluate for choledocholithiasis. EXAM: MRI ABDOMEN WITHOUT AND WITH CONTRAST (INCLUDING MRCP) TECHNIQUE: Multiplanar multisequence MR imaging of the abdomen was performed both before and after the administration of intravenous contrast. Heavily T2-weighted images of the biliary and pancreatic ducts were obtained, and three-dimensional MRCP images were rendered by post processing. CONTRAST:  7mL GADAVIST GADOBUTROL 1 MMOL/ML IV SOLN COMPARISON:  No prior abdominal MRI. CT of the abdomen and pelvis 08/20/2023. FINDINGS: Lower chest: Elevation of the right hemidiaphragm. Multiple prominent borderline enlarged juxta diaphragmatic lymph nodes are noted on the right measuring up to 8 mm in short axis (axial image 21 of series 26). Hepatobiliary: Multiple filling defects are noted within the gallbladder, most notably a 1.9 cm filling defect near the neck of the gallbladder, indicative of gallstones. Gallbladder is severely distended. Gallbladder wall appears thickened and edematous with trace volume of pericholecystic fluid. MRCP images are limited by patient respiratory motion. With this limitation in mind, no definite  filling defect within the common bile duct to suggest choledocholithiasis. Common bile duct is dilated measuring up to 11 mm. There is abrupt tapering of the distal common bile duct shortly above the level of the ampulla, suggesting a benign stricture. No intrahepatic biliary ductal dilatation noted at this time. Mild increased T2 signal intensity in a periportal distribution, indicative of periportal edema. No suspicious cystic or solid hepatic lesions. Pancreas: No pancreatic mass. No pancreatic ductal dilatation. No pancreatic or peripancreatic fluid collections or inflammatory changes. Spleen:  Unremarkable. Adrenals/Urinary Tract: Bilateral kidneys and adrenal glands are normal in appearance. No hydroureteronephrosis in the visualized portions of the abdomen. Stomach/Bowel: Visualized portions are unremarkable. Vascular/Lymphatic: Atherosclerosis of the abdominal aorta. No aneurysm identified in the visualized abdominal vasculature. No lymphadenopathy noted in the abdomen. Other: No significant volume of ascites noted in the visualized portions of the peritoneal cavity. Musculoskeletal: Innumerable small areas of heterogeneous enhancement noted throughout the visualized axial skeleton, indicative of widespread metastatic disease to the bones. Multiple prominent borderline enlarged enhancing lymph nodes in the lower left axial region partially imaged. IMPRESSION: 1. Cholelithiasis with evidence of acute cholecystitis. Surgical consultation is recommended. 2. No choledocholithiasis. 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 This is associated with common bile duct dilatation, but no frank intrahepatic biliary ductal dilatation is noted at this time. 3. Diffuse periportal edema in the liver. 4. Borderline enlarged juxta diaphragmatic lymph nodes and left axillary lymph nodes, likely metastatic. Widespread metastatic disease to the bones redemonstrated. Electronically Signed    By: Trudie Reed M.D.   On: 08/21/2023 07:41   MR 3D Recon At Scanner  Result Date: 08/21/2023 CLINICAL DATA:  47 year old female with history of transaminitis and abdominal pain. Gallstones. Evaluate for choledocholithiasis. EXAM: MRI ABDOMEN WITHOUT AND WITH CONTRAST (INCLUDING MRCP) TECHNIQUE: Multiplanar multisequence MR imaging of the abdomen was performed both before and after the administration of intravenous contrast. Heavily T2-weighted images of the biliary and pancreatic ducts were obtained, and three-dimensional MRCP images were rendered by post processing. CONTRAST:  7mL GADAVIST GADOBUTROL 1 MMOL/ML IV SOLN COMPARISON:  No prior abdominal MRI. CT of the abdomen and pelvis 08/20/2023. FINDINGS: Lower chest: Elevation of the right hemidiaphragm. Multiple prominent borderline enlarged juxta diaphragmatic lymph nodes are noted on the right measuring  up to 8 mm in short axis (axial image 21 of series 26). Hepatobiliary: Multiple filling defects are noted within the gallbladder, most notably a 1.9 cm filling defect near the neck of the gallbladder, indicative of gallstones. Gallbladder is severely distended. Gallbladder wall appears thickened and edematous with trace volume of pericholecystic fluid. MRCP images are limited by patient respiratory motion. With this limitation in mind, no definite filling defect within the common bile duct to suggest choledocholithiasis. Common bile duct is dilated measuring up to 11 mm. There is abrupt tapering of the distal common bile duct shortly above the level of the ampulla, suggesting a benign stricture. No intrahepatic biliary ductal dilatation noted at this time. Mild increased T2 signal intensity in a periportal distribution, indicative of periportal edema. No suspicious cystic or solid hepatic lesions. Pancreas: No pancreatic mass. No pancreatic ductal dilatation. No pancreatic or peripancreatic fluid collections or inflammatory changes. Spleen:   Unremarkable. Adrenals/Urinary Tract: Bilateral kidneys and adrenal glands are normal in appearance. No hydroureteronephrosis in the visualized portions of the abdomen. Stomach/Bowel: Visualized portions are unremarkable. Vascular/Lymphatic: Atherosclerosis of the abdominal aorta. No aneurysm identified in the visualized abdominal vasculature. No lymphadenopathy noted in the abdomen. Other: No significant volume of ascites noted in the visualized portions of the peritoneal cavity. Musculoskeletal: Innumerable small areas of heterogeneous enhancement noted throughout the visualized axial skeleton, indicative of widespread metastatic disease to the bones. Multiple prominent borderline enlarged enhancing lymph nodes in the lower left axial region partially imaged. IMPRESSION: 1. Cholelithiasis with evidence of acute cholecystitis. Surgical consultation is recommended. 2. No choledocholithiasis. 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 This is associated with common bile duct dilatation, but no frank intrahepatic biliary ductal dilatation is noted at this time. 3. Diffuse periportal edema in the liver. 4. Borderline enlarged juxta diaphragmatic lymph nodes and left axillary lymph nodes, likely metastatic. Widespread metastatic disease to the bones redemonstrated. Electronically Signed   By: Trudie Reed M.D.   On: 08/21/2023 07:41   US Venous Img Lower Unilateral Left  Result Date: 08/21/2023 CLINICAL DATA:  Left lower extremity pain.  Abnormal CT. EXAM: LEFT LOWER EXTREMITY VENOUS DOPPLER ULTRASOUND TECHNIQUE: Gray-scale sonography with compression, as well as color and duplex ultrasound, were performed to evaluate the deep venous system(s) from the level of the common femoral vein through the popliteal and proximal calf veins. COMPARISON:  None Available. FINDINGS: VENOUS Normal compressibility of the common femoral, superficial femoral, and popliteal veins, as well as  the visualized calf veins. Visualized portions of profunda femoral vein and great saphenous vein unremarkable. No filling defects to suggest DVT on grayscale or color Doppler imaging. Doppler waveforms show normal direction of venous flow, normal respiratory plasticity and response to augmentation. Limited views of the contralateral common femoral vein are unremarkable. OTHER None. Limitations: none IMPRESSION: No evidence of left lower extremity DVT. Electronically Signed   By: Narda Rutherford M.D.   On: 08/21/2023 00:08   US ABDOMEN LIMITED RUQ (LIVER/GB)  Result Date: 08/21/2023 CLINICAL DATA:  Abdominal pain. EXAM: ULTRASOUND ABDOMEN LIMITED RIGHT UPPER QUADRANT COMPARISON:  CT earlier today FINDINGS: Gallbladder: Moderately distended containing multiple intraluminal stones and sludge. Edematous gallbladder wall thickening at 5 mm. No sonographic Murphy sign noted by sonographer. Common bile duct: Diameter: 9 mm.  No visualized choledocholithiasis. Liver: There is central intrahepatic biliary ductal dilatation. No focal lesion identified. Within normal limits in parenchymal echogenicity. Portal vein is patent on color Doppler imaging with  normal direction of blood flow towards the liver. Other: No definite pericholecystic fluid by ultrasound. IMPRESSION: 1. Sonographic findings suspicious for acute cholecystitis. Gallstones and sludge with edematous gallbladder wall thickening. 2. Dilated common bile duct with intrahepatic biliary ductal dilatation. No visualized choledocholithiasis. MRCP could be performed for further assessment of the biliary tree as clinically indicated. Electronically Signed   By: Narda Rutherford M.D.   On: 08/21/2023 00:07   CT Angio Chest PE W and/or Wo Contrast  Result Date: 08/20/2023 CLINICAL DATA:  Pulmonary embolus suspected with high probability. Chest pain in the cancer patient. EXAM: CT ANGIOGRAPHY CHEST WITH CONTRAST TECHNIQUE: Multidetector CT imaging of the chest was  performed using the standard protocol during bolus administration of intravenous contrast. Multiplanar CT image reconstructions and MIPs were obtained to evaluate the vascular anatomy. RADIATION DOSE REDUCTION: This exam was performed according to the departmental dose-optimization program which includes automated exposure control, adjustment of the mA and/or kV according to patient size and/or use of iterative reconstruction technique. CONTRAST:  OMNIPAQUE IOHEXOL 350 MG/ML SOLN COMPARISON:  CT chest 02/03/2023.  PET-CT 07/06/2023 FINDINGS: Cardiovascular: Technically adequate study with good opacification of the central and segmental pulmonary arteries and mild motion artifact. No focal filling defects are demonstrated. No evidence of significant pulmonary embolus. Mild cardiac enlargement. No pericardial effusions. Normal caliber thoracic aorta. No aortic dissection. 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 Esophagus is decompressed. Thyroid gland is unremarkable. Prominent lymphadenopathy in the left axilla and left supraclavicular region. Left axillary lymph nodes measure up to about 2.3 cm short axis dimension. Lymph nodes are similar in size to prior PET-CT. No significant mediastinal lymphadenopathy. Lungs/Pleura: Motion artifact limits examination. There is patchy airspace disease throughout both lungs. This could indicate multifocal pneumonia or edema. No pleural effusions. No pneumothorax. Upper Abdomen: Enlarged lymph nodes in the cardiophrenic angle. No acute abnormalities demonstrated in the visualized upper abdomen. 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 Review of the MIP images confirms the above findings. IMPRESSION: 1. No evidence of significant pulmonary embolus. 2. Patchy airspace disease throughout both lungs likely representing edema or multifocal pneumonia. 3.  Metastatic lymphadenopathy in the left axilla and supraclavicular region. 4. Diffuse sclerotic skeletal metastasis. Electronically Signed   By: Burman Nieves M.D.   On: 08/20/2023 21:42   CT ABDOMEN PELVIS W CONTRAST  Result Date: 08/20/2023 CLINICAL DATA:  Abdominal pain, acute, nonlocalized. Current cancer patient. Pain in abdomen. EXAM: CT ABDOMEN AND PELVIS WITH CONTRAST TECHNIQUE: Multidetector CT imaging of the abdomen and pelvis was performed using the standard protocol following bolus administration of intravenous contrast. RADIATION DOSE REDUCTION: This exam was performed according to the departmental dose-optimization program which includes automated exposure control, adjustment of the mA and/or kV according to patient size and/or use of iterative reconstruction technique. CONTRAST:  OMNIPAQUE IOHEXOL 350 MG/ML SOLN COMPARISON:  Ultrasound pelvis 07/22/2023, CT abdomen pelvis 07/22/2023, pet ct 07/06/23 FINDINGS: Lower chest: Please see separately dictated CT angiography chest 08/20/2023 Hepatobiliary: No focal liver abnormality. Multiple calcified gallstones within the gallbladder lumen. Question gallbladder wall thickening and pericholecystic fluid. No biliary dilatation. Pancreas: No focal lesion. Normal pancreatic contour. No surrounding inflammatory changes. No main pancreatic ductal dilatation. Spleen: Normal in size without focal abnormality. Adrenals/Urinary Tract: No adrenal nodule bilaterally. Bilateral kidneys enhance symmetrically. No hydronephrosis. No hydroureter. The urinary bladder is unremarkable. Stomach/Bowel: Stomach is within normal limits. No evidence of bowel wall thickening or dilatation. Appendix  appears normal. Vascular/Lymphatic: Asymmetrically hypodense left femoral vein and external iliac vein compared to the right with interval increase in size compared to prior. No abdominal aorta or iliac aneurysm. Severe atherosclerotic plaque of the aorta and its branches.  Persistent prominent but nonenlarged retroperitoneal and mesenteric lymph nodes. No abdominal, pelvic, or inguinal lymphadenopathy. Reproductive: Uterine fibroid (8:83). Right adnexa is unremarkable. Similar-appearing heterogeneous hypodense 5.4 x 4.6 cm left adnexal/ovarian mass. Other: Trace volume rectouterine pouch free fluid. No intraperitoneal free gas. No organized fluid collection. Musculoskeletal: No abdominal wall hernia or abnormality. Redemonstrate of diffuse axial and appendicular sclerotic metastases. No acute displaced fracture. Multilevel degenerative changes of the spine. IMPRESSION: 1. Cholelithiasis with question gallbladder wall thickening and pericholecystic fluid. Recommend right upper quadrant ultrasound for further evaluation. 2. Asymmetrically hypodense left femoral vein and external iliac vein compared to the right with interval increase in size compared to prior. Query underlying thrombus. Consider ultrasound if clinically indicated. 3. Similar-appearing heterogeneous hypodense 5.4 x 4.6 cm left adnexal/ovarian mass. 4. Trace volume rectouterine pouch free fluid. 5. Persistent diffuse axial and appendicular sclerotic metastases. 6. Uterine fibroid. 7. Please see separately dictated CT angiography chest 08/20/2023. Electronically Signed   By: Tish Frederickson M.D.   On: 08/20/2023 21:42   DG Chest 2 View  Result Date: 08/20/2023 CLINICAL DATA:  Chest pain. EXAM: CHEST - 2 VIEW COMPARISON:  07/22/2023. FINDINGS: Bilateral lung fields are clear. Bilateral costophrenic angles are clear. Normal cardio-mediastinal silhouette. No acute osseous abnormalities. Redemonstration of extensive sclerotic metastases throughout the imaged bones. No pathological fracture seen. The soft tissues are within normal limits. Right-sided CT Port-A-Cath is seen with its tip overlying the cavoatrial junction region. IMPRESSION: 1. No active cardiopulmonary disease. 2. Diffuse osseous metastases. Electronically  Signed   By: Jules Schick M.D.   On: 08/20/2023 15:27    PERFORMANCE STATUS (ECOG) : 1 - Symptomatic but completely ambulatory  Review of Systems Unless otherwise noted, a complete review of systems is negative.  Physical Exam General: NAD Cardiovascular: regular rate and rhythm Pulmonary: clear ant fields Abdomen: soft, generalized tenderness, + bowel sounds GU: no suprapubic tenderness Extremities: no edema, no joint deformities Skin: no rashes Neurological: Weakness but otherwise nonfocal  IMPRESSION/PLAN: Stage IV metastatic adenocarcinoma of a GI source -has been off chemo since 07/18/2023 due to poor tolerance and multiple hospitalizations  Hepatitis- LFTs acutely and critically elevated over the past week. Alk phos >1600. Also with hyperbilirubinemia.  Patient recently hospitalized with cholecystitis with MRCP showing gallstones.  Patient was treated conservatively.  Question if this could be gallstone hepatitis. Patient nontoxic appearing but has generalized abdominal tenderness. Discussed with Dr. Orlie Dakin and patient transferred to the ED for further workup. Report called to triage nurse   Patient expressed understanding and was in agreement with this plan. She also understands that She can call clinic at any time with any questions, concerns, or complaints.   Thank you for allowing me to participate in the care of this very pleasant patient.   Time Total: 25 minutes  Visit consisted of counseling and education dealing with the complex and emotionally intense issues of symptom management in the setting of serious illness.Greater than 50%  of this time was spent counseling and coordinating care related to the above assessment and plan.  Signed by: Laurette Schimke, PhD, NP-C

## 2023-09-09 ENCOUNTER — Inpatient Hospital Stay: Payer: 59 | Admitting: Radiology

## 2023-09-09 ENCOUNTER — Other Ambulatory Visit: Payer: Self-pay

## 2023-09-09 ENCOUNTER — Telehealth: Payer: Self-pay

## 2023-09-09 DIAGNOSIS — D72819 Decreased white blood cell count, unspecified: Secondary | ICD-10-CM | POA: Diagnosis present

## 2023-09-09 DIAGNOSIS — D696 Thrombocytopenia, unspecified: Secondary | ICD-10-CM | POA: Diagnosis not present

## 2023-09-09 DIAGNOSIS — C801 Malignant (primary) neoplasm, unspecified: Secondary | ICD-10-CM | POA: Diagnosis not present

## 2023-09-09 DIAGNOSIS — Z66 Do not resuscitate: Secondary | ICD-10-CM | POA: Diagnosis not present

## 2023-09-09 DIAGNOSIS — Z1152 Encounter for screening for COVID-19: Secondary | ICD-10-CM | POA: Diagnosis not present

## 2023-09-09 DIAGNOSIS — C779 Secondary and unspecified malignant neoplasm of lymph node, unspecified: Secondary | ICD-10-CM | POA: Diagnosis present

## 2023-09-09 DIAGNOSIS — R1114 Bilious vomiting: Secondary | ICD-10-CM | POA: Diagnosis not present

## 2023-09-09 DIAGNOSIS — R1900 Intra-abdominal and pelvic swelling, mass and lump, unspecified site: Secondary | ICD-10-CM | POA: Diagnosis not present

## 2023-09-09 DIAGNOSIS — C799 Secondary malignant neoplasm of unspecified site: Secondary | ICD-10-CM

## 2023-09-09 DIAGNOSIS — I70262 Atherosclerosis of native arteries of extremities with gangrene, left leg: Secondary | ICD-10-CM | POA: Diagnosis not present

## 2023-09-09 DIAGNOSIS — D630 Anemia in neoplastic disease: Secondary | ICD-10-CM | POA: Diagnosis present

## 2023-09-09 DIAGNOSIS — R112 Nausea with vomiting, unspecified: Secondary | ICD-10-CM | POA: Diagnosis present

## 2023-09-09 DIAGNOSIS — N83512 Torsion of left ovary and ovarian pedicle: Secondary | ICD-10-CM | POA: Diagnosis present

## 2023-09-09 DIAGNOSIS — I745 Embolism and thrombosis of iliac artery: Secondary | ICD-10-CM | POA: Diagnosis present

## 2023-09-09 DIAGNOSIS — C787 Secondary malignant neoplasm of liver and intrahepatic bile duct: Secondary | ICD-10-CM | POA: Diagnosis present

## 2023-09-09 DIAGNOSIS — E8721 Acute metabolic acidosis: Secondary | ICD-10-CM | POA: Diagnosis not present

## 2023-09-09 DIAGNOSIS — I10 Essential (primary) hypertension: Secondary | ICD-10-CM | POA: Diagnosis present

## 2023-09-09 DIAGNOSIS — K810 Acute cholecystitis: Secondary | ICD-10-CM | POA: Diagnosis present

## 2023-09-09 DIAGNOSIS — J90 Pleural effusion, not elsewhere classified: Secondary | ICD-10-CM | POA: Diagnosis not present

## 2023-09-09 DIAGNOSIS — K819 Cholecystitis, unspecified: Secondary | ICD-10-CM

## 2023-09-09 DIAGNOSIS — R7401 Elevation of levels of liver transaminase levels: Secondary | ICD-10-CM | POA: Diagnosis not present

## 2023-09-09 DIAGNOSIS — E871 Hypo-osmolality and hyponatremia: Secondary | ICD-10-CM | POA: Diagnosis not present

## 2023-09-09 DIAGNOSIS — C269 Malignant neoplasm of ill-defined sites within the digestive system: Secondary | ICD-10-CM | POA: Diagnosis present

## 2023-09-09 DIAGNOSIS — G893 Neoplasm related pain (acute) (chronic): Secondary | ICD-10-CM | POA: Diagnosis not present

## 2023-09-09 DIAGNOSIS — K529 Noninfective gastroenteritis and colitis, unspecified: Secondary | ICD-10-CM | POA: Diagnosis present

## 2023-09-09 DIAGNOSIS — A419 Sepsis, unspecified organism: Secondary | ICD-10-CM | POA: Diagnosis not present

## 2023-09-09 DIAGNOSIS — Z515 Encounter for palliative care: Secondary | ICD-10-CM | POA: Diagnosis not present

## 2023-09-09 DIAGNOSIS — N179 Acute kidney failure, unspecified: Secondary | ICD-10-CM | POA: Diagnosis not present

## 2023-09-09 DIAGNOSIS — R079 Chest pain, unspecified: Secondary | ICD-10-CM | POA: Diagnosis not present

## 2023-09-09 DIAGNOSIS — J189 Pneumonia, unspecified organism: Secondary | ICD-10-CM | POA: Diagnosis not present

## 2023-09-09 DIAGNOSIS — I70221 Atherosclerosis of native arteries of extremities with rest pain, right leg: Secondary | ICD-10-CM | POA: Diagnosis not present

## 2023-09-09 DIAGNOSIS — R1084 Generalized abdominal pain: Secondary | ICD-10-CM | POA: Diagnosis not present

## 2023-09-09 DIAGNOSIS — F32A Depression, unspecified: Secondary | ICD-10-CM | POA: Diagnosis present

## 2023-09-09 DIAGNOSIS — C778 Secondary and unspecified malignant neoplasm of lymph nodes of multiple regions: Secondary | ICD-10-CM | POA: Diagnosis not present

## 2023-09-09 DIAGNOSIS — I739 Peripheral vascular disease, unspecified: Secondary | ICD-10-CM | POA: Diagnosis not present

## 2023-09-09 DIAGNOSIS — Y95 Nosocomial condition: Secondary | ICD-10-CM | POA: Diagnosis not present

## 2023-09-09 DIAGNOSIS — R0602 Shortness of breath: Secondary | ICD-10-CM | POA: Diagnosis not present

## 2023-09-09 DIAGNOSIS — N1330 Unspecified hydronephrosis: Secondary | ICD-10-CM | POA: Diagnosis present

## 2023-09-09 DIAGNOSIS — I701 Atherosclerosis of renal artery: Secondary | ICD-10-CM | POA: Diagnosis not present

## 2023-09-09 DIAGNOSIS — C169 Malignant neoplasm of stomach, unspecified: Secondary | ICD-10-CM | POA: Diagnosis present

## 2023-09-09 DIAGNOSIS — E222 Syndrome of inappropriate secretion of antidiuretic hormone: Secondary | ICD-10-CM | POA: Diagnosis present

## 2023-09-09 HISTORY — PX: IR PERC CHOLECYSTOSTOMY: IMG2326

## 2023-09-09 LAB — COMPREHENSIVE METABOLIC PANEL
ALT: 229 U/L — ABNORMAL HIGH (ref 0–44)
AST: 175 U/L — ABNORMAL HIGH (ref 15–41)
Albumin: 3 g/dL — ABNORMAL LOW (ref 3.5–5.0)
Alkaline Phosphatase: 1447 U/L — ABNORMAL HIGH (ref 38–126)
Anion gap: 11 (ref 5–15)
BUN: 6 mg/dL (ref 6–20)
CO2: 18 mmol/L — ABNORMAL LOW (ref 22–32)
Calcium: 8.6 mg/dL — ABNORMAL LOW (ref 8.9–10.3)
Chloride: 100 mmol/L (ref 98–111)
Creatinine, Ser: 0.36 mg/dL — ABNORMAL LOW (ref 0.44–1.00)
GFR, Estimated: >60 mL/min (ref >60)
Glucose, Bld: 90 mg/dL (ref 70–99)
Potassium: 3.6 mmol/L (ref 3.5–5.1)
Sodium: 129 mmol/L — ABNORMAL LOW (ref 135–145)
Total Bilirubin: 0.5 mg/dL (ref <1.2)
Total Protein: 7 g/dL (ref 6.5–8.1)

## 2023-09-09 LAB — CMV DNA, QUANTITATIVE, PCR
CMV DNA Quant: NEGATIVE [IU]/mL
Log10 CMV Qn DNA Pl: UNDETERMINED {Log}

## 2023-09-09 LAB — CBC
HCT: 24 % — ABNORMAL LOW (ref 36.0–46.0)
Hemoglobin: 7.7 g/dL — ABNORMAL LOW (ref 12.0–15.0)
MCH: 30.2 pg (ref 26.0–34.0)
MCHC: 32.1 g/dL (ref 30.0–36.0)
MCV: 94.1 fL (ref 80.0–100.0)
Platelets: 116 10*3/uL — ABNORMAL LOW (ref 150–400)
RBC: 2.55 MIL/uL — ABNORMAL LOW (ref 3.87–5.11)
RDW: 18.8 % — ABNORMAL HIGH (ref 11.5–15.5)
WBC: 3.9 10*3/uL — ABNORMAL LOW (ref 4.0–10.5)
nRBC: 2.5 % — ABNORMAL HIGH (ref 0.0–0.2)

## 2023-09-09 LAB — HEMOGLOBIN: Hemoglobin: 7.5 g/dL — ABNORMAL LOW (ref 12.0–15.0)

## 2023-09-09 MED ORDER — MORPHINE SULFATE (PF) 2 MG/ML IV SOLN: 2.0000 mg | INTRAVENOUS | Status: DC | PRN

## 2023-09-09 MED ORDER — LIDOCAINE HCL 1 % IJ SOLN
21.0000 mL | Freq: Once | INTRAMUSCULAR | Status: AC
  Administered 2023-09-09: 21 mL via INTRADERMAL
  Filled 2023-09-09: qty 21

## 2023-09-09 MED ORDER — ENOXAPARIN SODIUM 40 MG/0.4ML IJ SOSY
40.0000 mg | PREFILLED_SYRINGE | INTRAMUSCULAR | Status: DC
  Administered 2023-09-10 – 2023-10-04 (×23): 40 mg via SUBCUTANEOUS
  Filled 2023-09-09 (×26): qty 0.4

## 2023-09-09 MED ORDER — SODIUM CHLORIDE 0.9 % IV SOLN
2.0000 g | INTRAVENOUS | Status: DC
  Filled 2023-09-09 (×2): qty 2

## 2023-09-09 MED ORDER — SODIUM CHLORIDE 0.9% FLUSH
3.0000 mL | Freq: Two times a day (BID) | INTRAVENOUS | Status: DC
  Administered 2023-09-09 – 2023-10-01 (×30): 3 mL via INTRAVENOUS

## 2023-09-09 MED ORDER — OXYCODONE-ACETAMINOPHEN 5-325 MG PO TABS
1.0000 | ORAL_TABLET | Freq: Four times a day (QID) | ORAL | Status: DC | PRN
  Administered 2023-09-09 – 2023-09-12 (×4): 1 via ORAL
  Filled 2023-09-09 (×4): qty 1

## 2023-09-09 MED ORDER — DULOXETINE HCL 30 MG PO CPEP
60.0000 mg | ORAL_CAPSULE | Freq: Every day | ORAL | Status: DC
  Administered 2023-09-09 – 2023-10-04 (×26): 60 mg via ORAL
  Filled 2023-09-09 (×13): qty 2
  Filled 2023-09-09: qty 1
  Filled 2023-09-09 (×12): qty 2

## 2023-09-09 MED ORDER — ACETAMINOPHEN 325 MG PO TABS
650.0000 mg | ORAL_TABLET | Freq: Four times a day (QID) | ORAL | Status: DC | PRN
  Administered 2023-09-09 – 2023-09-23 (×5): 650 mg via ORAL
  Filled 2023-09-09 (×5): qty 2

## 2023-09-09 MED ORDER — IODIXANOL 320 MG/ML IV SOLN
5.0000 mL | Freq: Once | INTRAVENOUS | Status: DC | PRN
Start: 2023-09-09 — End: 2023-10-06

## 2023-09-09 MED ORDER — CHLORHEXIDINE GLUCONATE CLOTH 2 % EX PADS
6.0000 | MEDICATED_PAD | Freq: Every day | CUTANEOUS | Status: DC
  Administered 2023-09-10 – 2023-10-06 (×27): 6 via TOPICAL

## 2023-09-09 MED ORDER — OXYCODONE-ACETAMINOPHEN 10-325 MG PO TABS: 1.0000 | ORAL_TABLET | Freq: Four times a day (QID) | ORAL | Status: DC | PRN

## 2023-09-09 MED ORDER — ACETAMINOPHEN 650 MG RE SUPP
650.0000 mg | Freq: Four times a day (QID) | RECTAL | Status: DC | PRN
Start: 2023-09-09 — End: 2023-10-06

## 2023-09-09 MED ORDER — ALPRAZOLAM 0.5 MG PO TABS
0.5000 mg | ORAL_TABLET | Freq: Once | ORAL | Status: AC
  Administered 2023-09-09: 0.5 mg via ORAL
  Filled 2023-09-09: qty 1

## 2023-09-09 MED ORDER — MORPHINE SULFATE (PF) 2 MG/ML IV SOLN
2.0000 mg | INTRAVENOUS | Status: DC | PRN
  Administered 2023-09-09 (×4): 2 mg via INTRAVENOUS
  Filled 2023-09-09 (×4): qty 1

## 2023-09-09 MED ORDER — ALBUTEROL SULFATE (2.5 MG/3ML) 0.083% IN NEBU: 2.5000 mg | INHALATION_SOLUTION | Freq: Four times a day (QID) | RESPIRATORY_TRACT | Status: DC | PRN

## 2023-09-09 MED ORDER — PANTOPRAZOLE SODIUM 40 MG IV SOLR
40.0000 mg | Freq: Two times a day (BID) | INTRAVENOUS | Status: DC
  Administered 2023-09-09 – 2023-09-16 (×16): 40 mg via INTRAVENOUS
  Filled 2023-09-09 (×16): qty 10

## 2023-09-09 MED ORDER — MORPHINE SULFATE (PF) 2 MG/ML IV SOLN
2.0000 mg | INTRAVENOUS | Status: DC | PRN
  Administered 2023-09-09 (×2): 2 mg via INTRAVENOUS
  Filled 2023-09-09 (×2): qty 1

## 2023-09-09 MED ORDER — SODIUM CHLORIDE 0.9 % IV SOLN
8.0000 mg | Freq: Three times a day (TID) | INTRAVENOUS | Status: DC | PRN
  Administered 2023-09-11 – 2023-09-22 (×4): 8 mg via INTRAVENOUS
  Filled 2023-09-09 (×4): qty 4

## 2023-09-09 MED ORDER — MIDAZOLAM HCL 2 MG/2ML IJ SOLN
INTRAMUSCULAR | Status: AC | PRN
  Administered 2023-09-09: 1 mg via INTRAVENOUS

## 2023-09-09 MED ORDER — HYDROMORPHONE HCL 1 MG/ML IJ SOLN
1.0000 mg | INTRAMUSCULAR | Status: DC | PRN
  Administered 2023-09-09 – 2023-09-18 (×36): 1 mg via INTRAVENOUS
  Filled 2023-09-09 (×36): qty 1

## 2023-09-09 MED ORDER — LIDOCAINE HCL 1 % IJ SOLN
INTRAMUSCULAR | Status: AC
  Filled 2023-09-09: qty 20

## 2023-09-09 MED ORDER — MIDAZOLAM HCL 5 MG/5ML IJ SOLN
INTRAMUSCULAR | Status: AC | PRN
  Administered 2023-09-09: 1 mg via INTRAVENOUS

## 2023-09-09 MED ORDER — MIDAZOLAM HCL 2 MG/2ML IJ SOLN
INTRAMUSCULAR | Status: AC
  Filled 2023-09-09: qty 2

## 2023-09-09 MED ORDER — GABAPENTIN 300 MG PO CAPS
600.0000 mg | ORAL_CAPSULE | Freq: Three times a day (TID) | ORAL | Status: DC
  Administered 2023-09-09 – 2023-10-04 (×77): 600 mg via ORAL
  Filled 2023-09-09 (×78): qty 2

## 2023-09-09 MED ORDER — ALPRAZOLAM 0.25 MG PO TABS
0.2500 mg | ORAL_TABLET | Freq: Three times a day (TID) | ORAL | Status: DC | PRN
  Administered 2023-09-09 – 2023-10-04 (×45): 0.25 mg via ORAL
  Filled 2023-09-09 (×46): qty 1

## 2023-09-09 MED ORDER — OXYCODONE HCL 5 MG PO TABS
5.0000 mg | ORAL_TABLET | Freq: Four times a day (QID) | ORAL | Status: DC | PRN
  Administered 2023-09-09 – 2023-09-12 (×4): 5 mg via ORAL
  Filled 2023-09-09 (×4): qty 1

## 2023-09-09 MED ORDER — PIPERACILLIN-TAZOBACTAM 3.375 G IVPB
3.3750 g | Freq: Three times a day (TID) | INTRAVENOUS | Status: AC
  Administered 2023-09-09 – 2023-09-22 (×41): 3.375 g via INTRAVENOUS
  Filled 2023-09-09 (×43): qty 50

## 2023-09-09 MED ORDER — FENTANYL CITRATE (PF) 100 MCG/2ML IJ SOLN
INTRAMUSCULAR | Status: AC | PRN
  Administered 2023-09-09 (×2): 50 ug via INTRAVENOUS

## 2023-09-09 MED ORDER — FENTANYL CITRATE (PF) 100 MCG/2ML IJ SOLN
INTRAMUSCULAR | Status: AC
  Filled 2023-09-09: qty 2

## 2023-09-09 MED ORDER — HYDROMORPHONE HCL 1 MG/ML IJ SOLN
0.5000 mg | Freq: Once | INTRAMUSCULAR | Status: AC
  Administered 2023-09-09: 0.5 mg via INTRAVENOUS
  Filled 2023-09-09: qty 0.5

## 2023-09-09 NOTE — Patient Outreach (Signed)
  Care Management  Transitions of Care Program Transitions of Care Post-discharge week 3  09/09/2023 Name: Sheryl Silva MRN: [id number] DOB: February 06, 1976  Subjective: Sheryl Silva is a 47 y.o. year old female who is a primary care patient of [name], MD. The Care Management team Collaborated with [facility]   to assess and address transitions of care needs.  Patient had a Urgent visit and was subsequently admitted to the hospital DX Transaminitis  Generalized abdominal pain  Metastatic signet ring cell carcinoma (HCC)  Cholecystitis    Plan: Follow-up as indicated once discharged from hospital   Susa Loffler , BSN, RN Care Management Coordinator [place]   [facility] [email or web address] Direct Dial: [phone]

## 2023-09-09 NOTE — Consult Note (Signed)
Subjective:   CC: chronic cholecystitis  HPI:  Sheryl Silva is a 47 y.o. female who is consulted by Fran Lowes for evaluation of above cc.  Admitted couple weeks ago for same, treated with abx due to comorbidities making surgery too risky.  Returns for diarrhea and abdominal pain again.  Currently endorses pain to be more suprapubic    Past Medical History:  has a past medical history of Anxiety, Cancer (HCC), Depression, Heart murmur, and Hypertension.  Past Surgical History:  has a past surgical history that includes Cesarean section; IR IMAGING GUIDED PORT INSERTION (02/16/2023); Dilation and curettage of uterus; Colonoscopy with propofol (N/A, 02/21/2023); Flexible sigmoidoscopy (N/A, 07/26/2023); and biopsy (07/26/2023).  Family History: family history includes Cancer in her maternal grandmother.  Social History:  reports that she quit smoking about 8 months ago. Her smoking use included cigarettes. She has never used smokeless tobacco. She reports current alcohol use. She reports that she does not use drugs.  Current Medications:  Prior to Admission medications   Medication Sig Start Date End Date Taking? Authorizing Provider  albuterol (VENTOLIN HFA) 108 (90 Base) MCG/ACT inhaler Inhale 2 puffs into the lungs every 6 (six) hours as needed for wheezing or shortness of breath.    [provider]  amLODipine (NORVASC) 10 MG tablet Take 10 mg by mouth daily.    [provider]  cyanocobalamin (VITAMIN B12) 500 MCG tablet Take 500 mcg by mouth daily.    [provider]  cyclobenzaprine (FLEXERIL) 10 MG tablet Take 10 mg by mouth at bedtime as needed for muscle spasms.    [provider]  Doxepin HCl 6 MG TABS Take 6 mg by mouth at bedtime.    [provider]  DULoxetine (CYMBALTA) 60 MG capsule Take 60 mg by mouth daily.    [provider]  feeding supplement (ENSURE ENLIVE / ENSURE PLUS) LIQD Take 237 mLs by mouth 3 (three) times daily  between meals. 08/04/23   Enedina Finner, MD  gabapentin (NEURONTIN) 300 MG capsule Take 2 capsules (600 mg total) by mouth 3 (three) times daily. 08/29/23   Jeralyn Ruths, MD  hydrocortisone 2.5 % cream Apply 1 Application topically 2 (two) times daily as needed (skin irritation).    [provider]  lidocaine-prilocaine (EMLA) cream APPLY TO AFFECTED AREA ONCE AS DIRECTED 08/31/23   Jeralyn Ruths, MD  megestrol (MEGACE) 40 MG tablet TAKE 1 TABLET BY MOUTH EVERY DAY 06/27/23   Jeralyn Ruths, MD  Multiple Vitamin (MULTIVITAMIN) tablet Take 1 tablet by mouth daily.    [provider]  naloxone [facility]) nasal spray 4 mg/0.1 mL Place 1 spray into the nose once. Patient not taking: Reported on 09/08/2023 02/09/23   [provider]  ondansetron (ZOFRAN) 8 MG tablet Take 1 tablet (8 mg total) by mouth every 8 (eight) hours as needed for nausea or vomiting. Start on the third day after chemotherapy. 02/25/23   Jeralyn Ruths, MD  oxyCODONE-acetaminophen (PERCOCET) 10-325 MG tablet Take 1 tablet by mouth every 6 (six) hours as needed for pain.    [provider]  polyethylene glycol (MIRALAX / GLYCOLAX) 17 g packet Take 17 g by mouth daily as needed. Patient not taking: Reported on 09/08/2023 08/04/23   Enedina Finner, MD  prochlorperazine (COMPAZINE) 10 MG tablet Take 1 tablet (10 mg total) by mouth every 6 (six) hours as needed for nausea or vomiting. 02/25/23   Jeralyn Ruths, MD  traMADol (ULTRAM-ER) 300 MG  24 hr tablet Take 300 mg by mouth daily.    [provider]  valGANciclovir (VALCYTE) 450 MG tablet Take 450 mg by mouth 2 (two) times daily.    [provider]  Vitamin D, Ergocalciferol, (DRISDOL) 1.25 MG (50000 UNIT) CAPS capsule Take 50,000 Units by mouth every 7 (seven) days.    [provider]    Allergies:  Allergies as of 09/08/2023 - Review Complete 09/08/2023  Allergen Reaction Noted   Nifedipine Palpitations  01/24/2023    ROS:  General: Denies weight loss, weight gain, fatigue, fevers, chills, and night sweats. Eyes: Denies blurry vision, double vision, eye pain, itchy eyes, and tearing. Ears: Denies hearing loss, earache, and ringing in ears. Nose: Denies sinus pain, congestion, infections, runny nose, and nosebleeds. Mouth/throat: Denies hoarseness, sore throat, bleeding gums, and difficulty swallowing. Heart: Denies chest pain, palpitations, racing heart, irregular heartbeat, leg pain or swelling, and decreased activity tolerance. Respiratory: Denies breathing difficulty, shortness of breath, wheezing, cough, and sputum. GI: Denies change in appetite, heartburn, nausea, vomiting, constipation, diarrhea, and blood in stool. GU: Denies difficulty urinating, pain with urinating, urgency, frequency, blood in urine. Musculoskeletal: Denies joint stiffness, pain, swelling, muscle weakness. Skin: Denies rash, itching, mass, tumors, sores, and boils Neurologic: Denies headache, fainting, dizziness, seizures, numbness, and tingling. Psychiatric: Denies depression, anxiety, difficulty sleeping, and memory loss. Endocrine: Denies heat or cold intolerance, and increased thirst or urination. Blood/lymph: Denies easy bruising, and swollen glands     Objective:     BP 135/81   Pulse (!) 103   Temp 97.6 F (36.4 C) (Oral)   Resp 19   Ht 5\' 6"  (1.676 m)   Wt 70.8 kg   SpO2 98%   BMI 25.18 kg/m    Constitutional :  alert, cooperative, appears stated age, and no distress  Lymphatics/Throat:  no asymmetry, masses, or scars  Respiratory:  clear to auscultation bilaterally  Cardiovascular:  regular rate and rhythm  Gastrointestinal: Soft, no guarding, TTP throughout abdomen, mild, worst in suprapubic region .   Musculoskeletal: Steady movement  Skin: Cool and moist  Psychiatric: Normal affect, non-agitated, not confused       LABS:     Latest Ref Rng & Units 09/08/2023    1:32 PM 08/29/2023     8:25 AM 08/23/2023    4:17 AM  CMP  Glucose 70 - 99 mg/dL 188  88  94   BUN 6 - 20 mg/dL 9  8  9    Creatinine 0.44 - 1.00 mg/dL 4.16  6.06  3.01   Sodium 135 - 145 mmol/L 130  130  130   Potassium 3.5 - 5.1 mmol/L 3.7  3.6  3.6   Chloride 98 - 111 mmol/L 101  101  101   CO2 22 - 32 mmol/L 20  22  22    Calcium 8.9 - 10.3 mg/dL 8.9  8.5  8.0   Total Protein 6.5 - 8.1 g/dL 7.2  6.8  6.0   Total Bilirubin <1.2 mg/dL 1.3  0.7  0.9   Alkaline Phos 38 - 126 U/L 1,679  399  424   AST 15 - 41 U/L 683  34  40   ALT 0 - 44 U/L 324  22  26       Latest Ref Rng & Units 09/08/2023    1:32 PM 08/29/2023    8:25 AM 08/23/2023    4:17 AM  CBC  WBC 4.0 - 10.5 K/uL 4.9  3.3  3.7   Hemoglobin 12.0 - 15.0 g/dL 8.1  8.3  6.9   Hematocrit 36.0 - 46.0 % 24.9  26.1  20.9   Platelets 150 - 400 K/uL 166  132  143      RADS: CLINICAL DATA:  Elevated liver enzymes, nausea and diarrhea for 3 days, cholelithiasis with acute cholecystitis on previous imaging, history of metastatic adrenal carcinoma   EXAM: CT ABDOMEN AND PELVIS WITH CONTRAST   TECHNIQUE: Multidetector CT imaging of the abdomen and pelvis was performed using the standard protocol following bolus administration of intravenous contrast.   RADIATION DOSE REDUCTION: This exam was performed according to the departmental dose-optimization program which includes automated exposure control, adjustment of the mA and/or kV according to patient size and/or use of iterative reconstruction technique.   CONTRAST:  OMNIPAQUE IOHEXOL 300 MG/ML  SOLN   COMPARISON:  08/21/2023, 08/20/2023   FINDINGS: Lower chest: Stable bibasilar hypoventilatory changes and scarring. No acute pleural or parenchymal lung disease.   Hepatobiliary: There is a new 1 cm hypodensity within the left lobe liver, corresponding to hypoechoic lesion seen on today's ultrasound. Given history of metastatic adrenal carcinoma, new metastatic lesion is suspected.  Multiple gallstones are again identified, with progressive gallbladder wall thickening and intramural edema consistent with acute cholecystitis. No evidence of choledocholithiasis.   Pancreas: Unremarkable. No pancreatic ductal dilatation or surrounding inflammatory changes.   Spleen: Normal in size without focal abnormality.   Adrenals/Urinary Tract: The adrenals are unremarkable. The kidneys enhance normally. There is new mild left-sided hydronephrosis, with mucosal enhancement at the left UPJ suggesting obstructing mucosal lesion, reference image 43/2. No right-sided obstruction. No urinary tract calculi. The bladder is unremarkable.   Stomach/Bowel: No bowel obstruction or ileus. Normal appendix right lower quadrant. No bowel wall thickening or inflammatory change.   Vascular/Lymphatic: Stable atherosclerosis of the aorta. There is persistent retroperitoneal lymphadenopathy consistent with metastatic disease. Index left para-aortic lymph node image 44/2 measures 8 mm in short axis, stable. No new adenopathy.   Reproductive: 5.1 x 3.9 cm left adnexal mass not appreciably changed since prior exam. Right ovary is unremarkable and stable. Stable uterine fibroid.   Other: Trace pelvic free fluid unchanged. No free intraperitoneal gas. No abdominal wall hernia.   Musculoskeletal: Diffuse sclerosis throughout the visualized bony structures compatible with bony metastases. No acute or pathologic fracture. Reconstructed images demonstrate no additional findings.   IMPRESSION: 1. Cholelithiasis with progressive gallbladder wall thickening and intramural edema consistent with worsening acute cholecystitis. 2. New 1 cm hypodensity within the left lobe liver, concerning for new liver metastasis. This was not evident on recent CT or MRI. 3. Persistent metastatic disease, with diffuse sclerotic bony metastases and retroperitoneal adenopathy unchanged. 4. Stable indeterminate left  adnexal mass. 5. New mild left hydronephrosis, with subtle mucosal enhancement within the left ureter at the UPJ. Mucosal lesion cannot be excluded, and urologic consultation may be useful. 6.  Aortic Atherosclerosis (ICD10-I70.0).     Electronically Signed   By: Sharlet Salina M.D.   On: 09/08/2023 21:43 Assessment:      Cholecystitis, chronic on imaging Suprapubic abdominal pain  Plan:    Pt has chronic abdominal pain, states she is more concerned about her suprapubic pain.  No complaints of worsening right upper quadrant pain, despite obvious worsening LFTs and imaging for cholecystitis.  Recommend review of any other source of suprapubic pain, possibly new mets not clearly seen on CT vs urinary causes with new onset hydronephrosis?    If  no other obvious reason, recommend IR drainage at this point since she is still high risk to proceed with any surgical intervention due to comorbidities and previous abx therapy failed.    Surgery will be available if needed.    The patient understands the risks, any and all questions were answered to the patient's satisfaction.  labs/images/medications/previous chart entries reviewed personally and relevant changes/updates noted above.

## 2023-09-09 NOTE — Consult Note (Signed)
Chief Complaint: Patient was seen in consultation today for cholecystitis  Referring Physician(s): Sung Amabile, DO  Supervising Physician: Pernell Dupre  Patient Status: [facility] - ED  History of Present Illness: Sheryl Silva is a 47 y.o. female with PMH significant for anxiety, depression, heart murmur, hypertension, and stage IV signet ring adenocarcinoma being seen today in relation to acute-on-chronic cholecystitis. Patient was hospitalized 10/20-10/22 for cholecystitis which was managed with IV antibiotics. Patient presented to [facility] ED on 09/08/23 and was again found to have symptoms of cholecystitis. IR was consulted by surgical team to evaluate for possible percutaneous cholecystostomy drain placement.  Past Medical History:  Diagnosis Date   Anxiety    Cancer (HCC)    Depression    Heart murmur    Hypertension     Past Surgical History:  Procedure Laterality Date   BIOPSY  07/26/2023   Procedure: BIOPSY;  Surgeon: Toney Reil, MD;  Location: [facility] ENDOSCOPY;  Service: Gastroenterology;;   CESAREAN SECTION     COLONOSCOPY WITH PROPOFOL N/A 02/21/2023   Procedure: COLONOSCOPY WITH PROPOFOL;  Surgeon: Regis Bill, MD;  Location: [facility] ENDOSCOPY;  Service: Endoscopy;  Laterality: N/A;   DILATION AND CURETTAGE OF UTERUS     FLEXIBLE SIGMOIDOSCOPY N/A 07/26/2023   Procedure: FLEXIBLE SIGMOIDOSCOPY;  Surgeon: Toney Reil, MD;  Location: [facility] ENDOSCOPY;  Service: Gastroenterology;  Laterality: N/A;   IR IMAGING GUIDED PORT INSERTION  02/16/2023    Allergies: Nifedipine  Medications: Prior to Admission medications   Medication Sig Start Date End Date Taking? Authorizing Provider  albuterol (VENTOLIN HFA) 108 (90 Base) MCG/ACT inhaler Inhale 2 puffs into the lungs every 6 (six) hours as needed for wheezing or shortness of breath.   Yes [provider]  amLODipine (NORVASC) 10 MG tablet Take 10 mg by mouth daily.   Yes [provider]  cyanocobalamin (VITAMIN B12) 500 MCG tablet Take 500 mcg by mouth daily.   Yes [provider]  cyclobenzaprine (FLEXERIL) 10 MG tablet Take 10 mg by mouth at bedtime as needed for muscle spasms.   Yes [provider]  Doxepin HCl 6 MG TABS Take 6 mg by mouth daily as needed.   Yes [provider]  DULoxetine (CYMBALTA) 60 MG capsule Take 60 mg by mouth daily.   Yes [provider]  gabapentin (NEURONTIN) 300 MG capsule Take 2 capsules (600 mg total) by mouth 3 (three) times daily. 08/29/23  Yes Jeralyn Ruths, MD  hydrocortisone 2.5 % cream Apply 1 Application topically 2 (two) times daily as needed (skin irritation).   Yes [provider]  lidocaine-prilocaine (EMLA) cream APPLY TO AFFECTED AREA ONCE AS DIRECTED 08/31/23  Yes Jeralyn Ruths, MD  megestrol (MEGACE) 40 MG tablet TAKE 1 TABLET BY MOUTH EVERY DAY 06/27/23  Yes Jeralyn Ruths, MD  Multiple Vitamin (MULTIVITAMIN) tablet Take 1 tablet by mouth daily.   Yes [provider]  naloxone (NARCAN) nasal spray 4 mg/0.1 mL Place 1 spray into the nose once. 02/09/23  Yes [provider]  ondansetron (ZOFRAN) 8 MG tablet Take 1 tablet (8 mg total) by mouth every 8 (eight) hours as needed for nausea or vomiting. Start on the third day after chemotherapy. 02/25/23  Yes Jeralyn Ruths, MD  oxyCODONE-acetaminophen (PERCOCET) 10-325 MG tablet Take 1 tablet by mouth every 6 (six) hours as needed for pain.   Yes [provider]  polyethylene glycol (MIRALAX / GLYCOLAX) 17 g packet Take 17  g by mouth daily as needed. 08/04/23  Yes Enedina Finner, MD  prochlorperazine (COMPAZINE) 10 MG tablet Take 1 tablet (10 mg total) by mouth every 6 (six) hours as needed for nausea or vomiting. 02/25/23  Yes Jeralyn Ruths, MD  traMADol (ULTRAM-ER) 300 MG 24 hr tablet Take 300 mg by mouth daily.   Yes [provider]  valGANciclovir (VALCYTE) 450 MG tablet Take 450 mg by  mouth 2 (two) times daily.   Yes [provider]  Vitamin D, Ergocalciferol, (DRISDOL) 1.25 MG (50000 UNIT) CAPS capsule Take 50,000 Units by mouth every 7 (seven) days.   Yes [provider]  feeding supplement (ENSURE ENLIVE / ENSURE PLUS) LIQD Take 237 mLs by mouth 3 (three) times daily between meals. 08/04/23   Enedina Finner, MD     Family History  Problem Relation Age of Onset   Cancer Maternal Grandmother     Social History   Socioeconomic History   Marital status: Divorced    Spouse name: Not on file   Number of children: Not on file   Years of education: Not on file   Highest education level: Not on file  Occupational History   Not on file  Tobacco Use   Smoking status: Former    Current packs/day: 0.00    Types: Cigarettes    Quit date: 12/19/2022    Years since quitting: 0.7   Smokeless tobacco: Never   Tobacco comments:    Quit smoking 12/2022  Vaping Use   Vaping status: Never Used  Substance and Sexual Activity   Alcohol use: Yes    Comment: occ   Drug use: No   Sexual activity: Yes  Other Topics Concern   Not on file  Social History Narrative   Not on file   Social Determinants of Health   Financial Resource Strain: Low Risk  (05/09/2023)   Received from [facility], [facility]   Overall Financial Resource Strain (CARDIA)    Difficulty of Paying Living Expenses: Not hard at all  Recent Concern: Financial Resource Strain - Medium Risk (02/15/2023)   Overall Financial Resource Strain (CARDIA)    Difficulty of Paying Living Expenses: Somewhat hard  Food Insecurity: No Food Insecurity (08/22/2023)   Hunger Vital Sign    Worried About Running Out of Food in the Last Year: Never true    Ran Out of Food in the Last Year: Never true  Recent Concern: Food Insecurity - Food Insecurity Present (07/22/2023)   Hunger Vital Sign    Worried About Running Out of Food in the Last Year: Sometimes true    Ran Out of  Food in the Last Year: Sometimes true  Transportation Needs: No Transportation Needs (08/22/2023)   PRAPARE - Administrator, Civil Service (Medical): No    Lack of Transportation (Non-Medical): No  Recent Concern: Transportation Needs - Unmet Transportation Needs (07/22/2023)   PRAPARE - Transportation    Lack of Transportation (Medical): Yes    Lack of Transportation (Non-Medical): Yes  Physical Activity: Inactive (02/15/2023)   Exercise Vital Sign    Days of Exercise per Week: 0 days    Minutes of Exercise per Session: 0 min  Stress: Stress Concern Present (02/15/2023)   [employer] of Occupational Health - Occupational Stress Questionnaire    Feeling of Stress : To some extent  Social Connections: Socially Isolated (02/15/2023)   Social Connection and Isolation Panel [NHANES]    Frequency of  Communication with Friends and Family: More than three times a week    Frequency of Social Gatherings with Friends and Family: Once a week    Attends Religious Services: Never    Database administrator or Organizations: No    Attends Engineer, structural: Never    Marital Status: Divorced    Code Status: Full code  Review of Systems: A 12 point ROS discussed and pertinent positives are indicated in the HPI above.  All other systems are negative.  Review of Systems  Constitutional:  Negative for chills and fever.  Respiratory:  Positive for shortness of breath. Negative for chest tightness.   Cardiovascular:  Negative for chest pain and leg swelling.  Gastrointestinal:  Positive for abdominal pain, diarrhea and nausea. Negative for vomiting.  Neurological:  Positive for headaches. Negative for dizziness.  Psychiatric/Behavioral:  Negative for confusion.     Vital Signs: BP 135/81   Pulse (!) 103   Temp 97.8 F (36.6 C) (Oral)   Resp 19   Ht 5\' 6"  (1.676 m)   Wt 156 lb (70.8 kg)   SpO2 98%   BMI 25.18 kg/m     Physical Exam Vitals reviewed.   Constitutional:      General: She is not in acute distress.    Appearance: She is ill-appearing.  Cardiovascular:     Rate and Rhythm: Regular rhythm. Tachycardia present.     Pulses: Normal pulses.     Heart sounds: Normal heart sounds.  Pulmonary:     Effort: Pulmonary effort is normal.     Breath sounds: Normal breath sounds.  Abdominal:     Palpations: Abdomen is soft.     Tenderness: There is abdominal tenderness.  Musculoskeletal:     Right lower leg: No edema.     Left lower leg: No edema.  Skin:    General: Skin is warm and dry.  Neurological:     Mental Status: She is alert.  Psychiatric:        Mood and Affect: Mood normal.        Behavior: Behavior normal.        Thought Content: Thought content normal.        Judgment: Judgment normal.     Imaging: CT ABDOMEN PELVIS W CONTRAST  Result Date: 09/08/2023 CLINICAL DATA:  Elevated liver enzymes, nausea and diarrhea for 3 days, cholelithiasis with acute cholecystitis on previous imaging, history of metastatic adrenal carcinoma EXAM: CT ABDOMEN AND PELVIS WITH CONTRAST TECHNIQUE: Multidetector CT imaging of the abdomen and pelvis was performed using the standard protocol following bolus administration of intravenous contrast. RADIATION DOSE REDUCTION: This exam was performed according to the departmental dose-optimization program which includes automated exposure control, adjustment of the mA and/or kV according to patient size and/or use of iterative reconstruction technique. CONTRAST:  OMNIPAQUE IOHEXOL 300 MG/ML  SOLN COMPARISON:  08/21/2023, 08/20/2023 FINDINGS: Lower chest: Stable bibasilar hypoventilatory changes and scarring. No acute pleural or parenchymal lung disease. Hepatobiliary: There is a new 1 cm hypodensity within the left lobe liver, corresponding to hypoechoic lesion seen on today's ultrasound. Given history of metastatic adrenal carcinoma, new metastatic lesion is suspected. Multiple gallstones are  again identified, with progressive gallbladder wall thickening and intramural edema consistent with acute cholecystitis. No evidence of choledocholithiasis. Pancreas: Unremarkable. No pancreatic ductal dilatation or surrounding inflammatory changes. Spleen: Normal in size without focal abnormality. Adrenals/Urinary Tract: The adrenals are unremarkable. The kidneys enhance normally. There is new mild left-sided  hydronephrosis, with mucosal enhancement at the left UPJ suggesting obstructing mucosal lesion, reference image 43/2. No right-sided obstruction. No urinary tract calculi. The bladder is unremarkable. Stomach/Bowel: No bowel obstruction or ileus. Normal appendix right lower quadrant. No bowel wall thickening or inflammatory change. Vascular/Lymphatic: Stable atherosclerosis of the aorta. There is persistent retroperitoneal lymphadenopathy consistent with metastatic disease. Index left para-aortic lymph node image 44/2 measures 8 mm in short axis, stable. No new adenopathy. Reproductive: 5.1 x 3.9 cm left adnexal mass not appreciably changed since prior exam. Right ovary is unremarkable and stable. Stable uterine fibroid. Other: Trace pelvic free fluid unchanged. No free intraperitoneal gas. No abdominal wall hernia. Musculoskeletal: Diffuse sclerosis throughout the visualized bony structures compatible with bony metastases. No acute or pathologic fracture. Reconstructed images demonstrate no additional findings. IMPRESSION: 1. Cholelithiasis with progressive gallbladder wall thickening and intramural edema consistent with worsening acute cholecystitis. 2. New 1 cm hypodensity within the left lobe liver, concerning for new liver metastasis. This was not evident on recent CT or MRI. 3. Persistent metastatic disease, with diffuse sclerotic bony metastases and retroperitoneal adenopathy unchanged. 4. Stable indeterminate left adnexal mass. 5. New mild left hydronephrosis, with subtle mucosal enhancement within the  left ureter at the UPJ. Mucosal lesion cannot be excluded, and urologic consultation may be useful. 6.  Aortic Atherosclerosis (ICD10-I70.0). Electronically Signed   By: Sharlet Salina M.D.   On: 09/08/2023 21:43   US Abdomen Limited RUQ (LIVER/GB)  Result Date: 09/08/2023 CLINICAL DATA:  Quadrant pain for 3 days EXAM: ULTRASOUND ABDOMEN LIMITED RIGHT UPPER QUADRANT COMPARISON:  08/20/2023 FINDINGS: Gallbladder: Multiple shadowing gallstones are seen filling the gallbladder lumen. Gallbladder wall remains thickened measuring up to 8 mm, with prominent intramural edema now noted. Negative sonographic Murphy sign. Common bile duct: Diameter: 11 mm Liver: Normal liver echotexture. There is a circumscribed 1.3 cm hypoechoic area within the left lobe liver, without associated abnormality on prior CT or MRI. Portal vein is patent on color Doppler imaging with normal direction of blood flow towards the liver. Other: None. IMPRESSION: 1. Cholelithiasis, with worsening gallbladder wall thickening compatible with progressive acute cholecystitis. 2. Stable dilated common bile duct. 3. Indeterminate 1.3 cm hypoechoic area left lobe liver, not clearly seen on previous CT or MRI. Electronically Signed   By: Sharlet Salina M.D.   On: 09/08/2023 20:59   MR ABDOMEN MRCP W WO CONTAST  Result Date: 08/21/2023 CLINICAL DATA:  47 year old female with history of transaminitis and abdominal pain. Gallstones. Evaluate for choledocholithiasis. EXAM: MRI ABDOMEN WITHOUT AND WITH CONTRAST (INCLUDING MRCP) TECHNIQUE: Multiplanar multisequence MR imaging of the abdomen was performed both before and after the administration of intravenous contrast. Heavily T2-weighted images of the biliary and pancreatic ducts were obtained, and three-dimensional MRCP images were rendered by post processing. CONTRAST:  7mL GADAVIST GADOBUTROL 1 MMOL/ML IV SOLN COMPARISON:  No prior abdominal MRI. CT of the abdomen and pelvis 08/20/2023. FINDINGS: Lower  chest: Elevation of the right hemidiaphragm. Multiple prominent borderline enlarged juxta diaphragmatic lymph nodes are noted on the right measuring up to 8 mm in short axis (axial image 21 of series 26). Hepatobiliary: Multiple filling defects are noted within the gallbladder, most notably a 1.9 cm filling defect near the neck of the gallbladder, indicative of gallstones. Gallbladder is severely distended. Gallbladder wall appears thickened and edematous with trace volume of pericholecystic fluid. MRCP images are limited by patient respiratory motion. With this limitation in mind, no definite filling defect within the common bile duct to suggest  choledocholithiasis. Common bile duct is dilated measuring up to 11 mm. There is abrupt tapering of the distal common bile duct shortly above the level of the ampulla, suggesting a benign stricture. No intrahepatic biliary ductal dilatation noted at this time. Mild increased T2 signal intensity in a periportal distribution, indicative of periportal edema. No suspicious cystic or solid hepatic lesions. Pancreas: No pancreatic mass. No pancreatic ductal dilatation. No pancreatic or peripancreatic fluid collections or inflammatory changes. Spleen:  Unremarkable. Adrenals/Urinary Tract: Bilateral kidneys and adrenal glands are normal in appearance. No hydroureteronephrosis in the visualized portions of the abdomen. Stomach/Bowel: Visualized portions are unremarkable. Vascular/Lymphatic: Atherosclerosis of the abdominal aorta. No aneurysm identified in the visualized abdominal vasculature. No lymphadenopathy noted in the abdomen. Other: No significant volume of ascites noted in the visualized portions of the peritoneal cavity. Musculoskeletal: Innumerable small areas of heterogeneous enhancement noted throughout the visualized axial skeleton, indicative of widespread metastatic disease to the bones. Multiple prominent borderline enlarged enhancing lymph nodes in the lower left  axial region partially imaged. IMPRESSION: 1. Cholelithiasis with evidence of acute cholecystitis. Surgical consultation is recommended. 2. No choledocholithiasis. 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 This is associated with common bile duct dilatation, but no frank intrahepatic biliary ductal dilatation is noted at this time. 3. Diffuse periportal edema in the liver. 4. Borderline enlarged juxta diaphragmatic lymph nodes and left axillary lymph nodes, likely metastatic. Widespread metastatic disease to the bones redemonstrated. Electronically Signed   By: Trudie Reed M.D.   On: 08/21/2023 07:41   MR 3D Recon At Scanner  Result Date: 08/21/2023 CLINICAL DATA:  47 year old female with history of transaminitis and abdominal pain. Gallstones. Evaluate for choledocholithiasis. EXAM: MRI ABDOMEN WITHOUT AND WITH CONTRAST (INCLUDING MRCP) TECHNIQUE: Multiplanar multisequence MR imaging of the abdomen was performed both before and after the administration of intravenous contrast. Heavily T2-weighted images of the biliary and pancreatic ducts were obtained, and three-dimensional MRCP images were rendered by post processing. CONTRAST:  7mL GADAVIST GADOBUTROL 1 MMOL/ML IV SOLN COMPARISON:  No prior abdominal MRI. CT of the abdomen and pelvis 08/20/2023. FINDINGS: Lower chest: Elevation of the right hemidiaphragm. Multiple prominent borderline enlarged juxta diaphragmatic lymph nodes are noted on the right measuring up to 8 mm in short axis (axial image 21 of series 26). Hepatobiliary: Multiple filling defects are noted within the gallbladder, most notably a 1.9 cm filling defect near the neck of the gallbladder, indicative of gallstones. Gallbladder is severely distended. Gallbladder wall appears thickened and edematous with trace volume of pericholecystic fluid. MRCP images are limited by patient respiratory motion. With this limitation in mind, no definite filling  defect within the common bile duct to suggest choledocholithiasis. Common bile duct is dilated measuring up to 11 mm. There is abrupt tapering of the distal common bile duct shortly above the level of the ampulla, suggesting a benign stricture. No intrahepatic biliary ductal dilatation noted at this time. Mild increased T2 signal intensity in a periportal distribution, indicative of periportal edema. No suspicious cystic or solid hepatic lesions. Pancreas: No pancreatic mass. No pancreatic ductal dilatation. No pancreatic or peripancreatic fluid collections or inflammatory changes. Spleen:  Unremarkable. Adrenals/Urinary Tract: Bilateral kidneys and adrenal glands are normal in appearance. No hydroureteronephrosis in the visualized portions of the abdomen. Stomach/Bowel: Visualized portions are unremarkable. Vascular/Lymphatic: Atherosclerosis of the abdominal aorta. No aneurysm identified in the visualized abdominal vasculature. No lymphadenopathy noted in the abdomen. Other: No significant volume of ascites  noted in the visualized portions of the peritoneal cavity. Musculoskeletal: Innumerable small areas of heterogeneous enhancement noted throughout the visualized axial skeleton, indicative of widespread metastatic disease to the bones. Multiple prominent borderline enlarged enhancing lymph nodes in the lower left axial region partially imaged. IMPRESSION: 1. Cholelithiasis with evidence of acute cholecystitis. Surgical consultation is recommended. 2. No choledocholithiasis. 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 This is associated with common bile duct dilatation, but no frank intrahepatic biliary ductal dilatation is noted at this time. 3. Diffuse periportal edema in the liver. 4. Borderline enlarged juxta diaphragmatic lymph nodes and left axillary lymph nodes, likely metastatic. Widespread metastatic disease to the bones redemonstrated. Electronically Signed   By:  Trudie Reed M.D.   On: 08/21/2023 07:41   US Venous Img Lower Unilateral Left  Result Date: 08/21/2023 CLINICAL DATA:  Left lower extremity pain.  Abnormal CT. EXAM: LEFT LOWER EXTREMITY VENOUS DOPPLER ULTRASOUND TECHNIQUE: Gray-scale sonography with compression, as well as color and duplex ultrasound, were performed to evaluate the deep venous system(s) from the level of the common femoral vein through the popliteal and proximal calf veins. COMPARISON:  None Available. FINDINGS: VENOUS Normal compressibility of the common femoral, superficial femoral, and popliteal veins, as well as the visualized calf veins. Visualized portions of profunda femoral vein and great saphenous vein unremarkable. No filling defects to suggest DVT on grayscale or color Doppler imaging. Doppler waveforms show normal direction of venous flow, normal respiratory plasticity and response to augmentation. Limited views of the contralateral common femoral vein are unremarkable. OTHER None. Limitations: none IMPRESSION: No evidence of left lower extremity DVT. Electronically Signed   By: Narda Rutherford M.D.   On: 08/21/2023 00:08   US ABDOMEN LIMITED RUQ (LIVER/GB)  Result Date: 08/21/2023 CLINICAL DATA:  Abdominal pain. EXAM: ULTRASOUND ABDOMEN LIMITED RIGHT UPPER QUADRANT COMPARISON:  CT earlier today FINDINGS: Gallbladder: Moderately distended containing multiple intraluminal stones and sludge. Edematous gallbladder wall thickening at 5 mm. No sonographic Murphy sign noted by sonographer. Common bile duct: Diameter: 9 mm.  No visualized choledocholithiasis. Liver: There is central intrahepatic biliary ductal dilatation. No focal lesion identified. Within normal limits in parenchymal echogenicity. Portal vein is patent on color Doppler imaging with normal direction of blood flow towards the liver. Other: No definite pericholecystic fluid by ultrasound. IMPRESSION: 1. Sonographic findings suspicious for acute cholecystitis.  Gallstones and sludge with edematous gallbladder wall thickening. 2. Dilated common bile duct with intrahepatic biliary ductal dilatation. No visualized choledocholithiasis. MRCP could be performed for further assessment of the biliary tree as clinically indicated. Electronically Signed   By: Narda Rutherford M.D.   On: 08/21/2023 00:07   CT Angio Chest PE W and/or Wo Contrast  Result Date: 08/20/2023 CLINICAL DATA:  Pulmonary embolus suspected with high probability. Chest pain in the cancer patient. EXAM: CT ANGIOGRAPHY CHEST WITH CONTRAST TECHNIQUE: Multidetector CT imaging of the chest was performed using the standard protocol during bolus administration of intravenous contrast. Multiplanar CT image reconstructions and MIPs were obtained to evaluate the vascular anatomy. RADIATION DOSE REDUCTION: This exam was performed according to the departmental dose-optimization program which includes automated exposure control, adjustment of the mA and/or kV according to patient size and/or use of iterative reconstruction technique. CONTRAST:  OMNIPAQUE IOHEXOL 350 MG/ML SOLN COMPARISON:  CT chest 02/03/2023.  PET-CT 07/06/2023 FINDINGS: Cardiovascular: Technically adequate study with good opacification of the central and segmental pulmonary arteries and mild motion artifact. No focal  filling defects are demonstrated. No evidence of significant pulmonary embolus. Mild cardiac enlargement. No pericardial effusions. Normal caliber thoracic aorta. No aortic dissection. 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 Esophagus is decompressed. Thyroid gland is unremarkable. Prominent lymphadenopathy in the left axilla and left supraclavicular region. Left axillary lymph nodes measure up to about 2.3 cm short axis dimension. Lymph nodes are similar in size to prior PET-CT. No significant mediastinal lymphadenopathy. Lungs/Pleura: Motion artifact limits examination. There is patchy  airspace disease throughout both lungs. This could indicate multifocal pneumonia or edema. No pleural effusions. No pneumothorax. Upper Abdomen: Enlarged lymph nodes in the cardiophrenic angle. No acute abnormalities demonstrated in the visualized upper abdomen. 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 Review of the MIP images confirms the above findings. IMPRESSION: 1. No evidence of significant pulmonary embolus. 2. Patchy airspace disease throughout both lungs likely representing edema or multifocal pneumonia. 3. Metastatic lymphadenopathy in the left axilla and supraclavicular region. 4. Diffuse sclerotic skeletal metastasis. Electronically Signed   By: Burman Nieves M.D.   On: 08/20/2023 21:42   CT ABDOMEN PELVIS W CONTRAST  Result Date: 08/20/2023 CLINICAL DATA:  Abdominal pain, acute, nonlocalized. Current cancer patient. Pain in abdomen. EXAM: CT ABDOMEN AND PELVIS WITH CONTRAST TECHNIQUE: Multidetector CT imaging of the abdomen and pelvis was performed using the standard protocol following bolus administration of intravenous contrast. RADIATION DOSE REDUCTION: This exam was performed according to the departmental dose-optimization program which includes automated exposure control, adjustment of the mA and/or kV according to patient size and/or use of iterative reconstruction technique. CONTRAST:  OMNIPAQUE IOHEXOL 350 MG/ML SOLN COMPARISON:  Ultrasound pelvis 07/22/2023, CT abdomen pelvis 07/22/2023, pet ct 07/06/23 FINDINGS: Lower chest: Please see separately dictated CT angiography chest 08/20/2023 Hepatobiliary: No focal liver abnormality. Multiple calcified gallstones within the gallbladder lumen. Question gallbladder wall thickening and pericholecystic fluid. No biliary dilatation. Pancreas: No focal lesion. Normal pancreatic contour. No surrounding inflammatory changes. No main pancreatic ductal dilatation.  Spleen: Normal in size without focal abnormality. Adrenals/Urinary Tract: No adrenal nodule bilaterally. Bilateral kidneys enhance symmetrically. No hydronephrosis. No hydroureter. The urinary bladder is unremarkable. Stomach/Bowel: Stomach is within normal limits. No evidence of bowel wall thickening or dilatation. Appendix appears normal. Vascular/Lymphatic: Asymmetrically hypodense left femoral vein and external iliac vein compared to the right with interval increase in size compared to prior. No abdominal aorta or iliac aneurysm. Severe atherosclerotic plaque of the aorta and its branches. Persistent prominent but nonenlarged retroperitoneal and mesenteric lymph nodes. No abdominal, pelvic, or inguinal lymphadenopathy. Reproductive: Uterine fibroid (8:83). Right adnexa is unremarkable. Similar-appearing heterogeneous hypodense 5.4 x 4.6 cm left adnexal/ovarian mass. Other: Trace volume rectouterine pouch free fluid. No intraperitoneal free gas. No organized fluid collection. Musculoskeletal: No abdominal wall hernia or abnormality. Redemonstrate of diffuse axial and appendicular sclerotic metastases. No acute displaced fracture. Multilevel degenerative changes of the spine. IMPRESSION: 1. Cholelithiasis with question gallbladder wall thickening and pericholecystic fluid. Recommend right upper quadrant ultrasound for further evaluation. 2. Asymmetrically hypodense left femoral vein and external iliac vein compared to the right with interval increase in size compared to prior. Query underlying thrombus. Consider ultrasound if clinically indicated. 3. Similar-appearing heterogeneous hypodense 5.4 x 4.6 cm left adnexal/ovarian mass. 4. Trace volume rectouterine pouch free fluid. 5. Persistent diffuse axial and appendicular sclerotic metastases. 6. Uterine fibroid. 7. Please see separately dictated CT angiography chest 08/20/2023. Electronically Signed   By: Normajean Glasgow.D.  On: 08/20/2023 21:42   DG Chest 2  View  Result Date: 08/20/2023 CLINICAL DATA:  Chest pain. EXAM: CHEST - 2 VIEW COMPARISON:  07/22/2023. FINDINGS: Bilateral lung fields are clear. Bilateral costophrenic angles are clear. Normal cardio-mediastinal silhouette. No acute osseous abnormalities. Redemonstration of extensive sclerotic metastases throughout the imaged bones. No pathological fracture seen. The soft tissues are within normal limits. Right-sided CT Port-A-Cath is seen with its tip overlying the cavoatrial junction region. IMPRESSION: 1. No active cardiopulmonary disease. 2. Diffuse osseous metastases. Electronically Signed   By: Jules Schick M.D.   On: 08/20/2023 15:27    Labs:  CBC: Recent Labs    08/23/23 0417 08/29/23 0825 09/08/23 1332 09/09/23 0735  WBC 3.7* 3.3* 4.9 3.9*  HGB 6.9* 8.3* 8.1* 7.7*  HCT 20.9* 26.1* 24.9* 24.0*  PLT 143* 132* 166 116*    COAGS: Recent Labs    07/22/23 0225 09/08/23 1948  INR 1.1 1.2  APTT 32  --     BMP: Recent Labs    08/23/23 0417 08/29/23 0825 09/08/23 1332 09/09/23 0735  NA 130* 130* 130* 129*  K 3.6 3.6 3.7 3.6  CL 101 101 101 100  CO2 22 22 20* 18*  GLUCOSE 94 88 108* 90  BUN 9 8 9 6   CALCIUM 8.0* 8.5* 8.9 8.6*  CREATININE 0.43* 0.54 0.50 0.36*  GFRNONAA >60 >60 >60 >60    LIVER FUNCTION TESTS: Recent Labs    08/23/23 0417 08/29/23 0825 09/08/23 1332 09/09/23 0735  BILITOT 0.9 0.7 1.3* 0.5  AST 40 34 683* 175*  ALT 26 22 324* 229*  ALKPHOS 424* 399* 1,679* 1,447*  PROT 6.0* 6.8 7.2 7.0  ALBUMIN 2.5* 3.1* 3.2* 3.0*    TUMOR MARKERS: No results for input(s): "AFPTM", "CEA", "CA199", "CHROMGRNA" in the last 8760 hours.  Assessment and Plan:  Sheryl Silva is a 47 yo female being seen today in relation to cholecystitis. Patient has had several recent bouts of cholecystitis with attempts at conservative management. Surgical team consulted IR for evaluation for percutaneous cholecystostomy drain placement. Case reviewed and approved by  Dr Juliette Alcide for image-guided percutaneous cholecystostomy drain placement. Patient is NPO.  Risks and benefits discussed with the patient including, but not limited to bleeding, infection, gallbladder perforation, bile leak, sepsis or even death.  All of the patient's questions were answered, patient is agreeable to proceed. Consent signed and in chart.   Thank you for this interesting consult.  I greatly enjoyed meeting Sheryl Silva and look forward to participating in their care.  A copy of this report was sent to the requesting provider on this date.  Electronically Signed: Kennieth Francois, PA-C 09/09/2023, 2:21 PM   I spent a total of 40 Minutes in face to face in clinical consultation, greater than 50% of which was counseling/coordinating care for cholecystitis.

## 2023-09-09 NOTE — ED Notes (Signed)
Pt AOX4, c/o pain. Respiration even and unlabored. Pt given additional warm blanket.

## 2023-09-09 NOTE — ED Notes (Signed)
Pt given hygiene supplies to take bed bath and brush teeth etc. New gown, and hospital socks and brief to change into supplied.

## 2023-09-09 NOTE — Procedures (Signed)
Interventional Radiology Procedure Note  Date of Procedure: 09/09/2023  Procedure: Chole tube placement   Findings:  1. Chole tube placement 10 Fr to bag drain    Complications: No immediate complications noted.   Estimated Blood Loss: minimal  Follow-up and Recommendations: 1. Keep to bag drain  2. Additional recs per surgery    Olive Bass, MD  Vascular & Interventional Radiology  09/09/2023 4:34 PM

## 2023-09-09 NOTE — ED Notes (Signed)
Pt taken to procedure at this time.

## 2023-09-09 NOTE — ED Notes (Signed)
Pt requesting additional pain meds   Will notify MD

## 2023-09-09 NOTE — Consult Note (Signed)
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  INTERVAL HISTORY: Patient is a 47 year old female with the above-stated malignancy who has not received chemotherapy in greater than 1 month was initially evaluated in clinic and noted to have significant liver dysfunction.  Subsequent imaging revealed likely cholecystitis as the etiology.  Patient continues to have abdominal pain and some mild diarrhea, but otherwise feels slightly improved since admission.  She has no neurologic complaints.  She denies any recent fevers.  She has a fair appetite, but denies weight loss.  She has no chest pain, shortness of breath, cough, or hemoptysis.  She denies any nausea, vomiting, or constipation.  She has no melena or hematochezia.  She has no urinary complaints.  Patient feels generally terrible, but offers no further specific complaints today.  REVIEW OF SYSTEMS:   Review of Systems  Constitutional:  Positive for malaise/fatigue. Negative for fever and weight loss.  Respiratory: Negative.  Negative for cough, hemoptysis and shortness of breath.   Cardiovascular: Negative.  Negative for chest pain and leg swelling.  Gastrointestinal:  Positive for abdominal pain and diarrhea. Negative for blood in stool and melena.  Genitourinary: Negative.  Negative for dysuria.  Musculoskeletal: Negative.  Negative for back pain.  Skin: Negative.   Negative for rash.  Neurological:  Positive for weakness. Negative for dizziness and headaches.  Psychiatric/Behavioral: Negative.  The patient is not nervous/anxious.     As per HPI. Otherwise, a complete review of systems is negative.  PAST MEDICAL HISTORY: Past Medical History:  Diagnosis Date   Anxiety    Cancer (HCC)    Depression    Heart murmur    Hypertension     PAST SURGICAL HISTORY: Past Surgical History:  Procedure Laterality Date   BIOPSY  07/26/2023   Procedure: BIOPSY;  Surgeon: Toney Reil, MD;  Location: [facility] ENDOSCOPY;  Service: Gastroenterology;;   CESAREAN SECTION     COLONOSCOPY WITH PROPOFOL N/A 02/21/2023   Procedure: COLONOSCOPY WITH PROPOFOL;  Surgeon: Regis Bill, MD;  Location: [facility] ENDOSCOPY;  Service: Endoscopy;  Laterality: N/A;   DILATION AND CURETTAGE OF UTERUS     FLEXIBLE SIGMOIDOSCOPY N/A 07/26/2023   Procedure: FLEXIBLE SIGMOIDOSCOPY;  Surgeon: Toney Reil, MD;  Location: [facility] ENDOSCOPY;  Service: Gastroenterology;  Laterality: N/A;   IR IMAGING GUIDED PORT INSERTION  02/16/2023    FAMILY HISTORY: Family History  Problem Relation Age of Onset   Cancer Maternal Grandmother     ADVANCED DIRECTIVES (Y/N):  @ADVDIR @  HEALTH MAINTENANCE: Social History   Tobacco Use   Smoking status: Former    Current packs/day: 0.00    Types: Cigarettes    Quit date: 12/19/2022    Years since quitting: 0.7   Smokeless tobacco: Never   Tobacco comments:    Quit smoking 12/2022  Vaping Use   Vaping status: Never Used  Substance Use Topics   Alcohol use: Yes    Comment: occ   Drug use: No  Colonoscopy:  PAP:  Bone density:  Lipid panel:  Allergies  Allergen Reactions   Nifedipine Palpitations    Current Facility-Administered Medications  Medication Dose Route Frequency Provider Last Rate Last Admin   acetaminophen (TYLENOL) tablet 650 mg  650 mg Oral Q6H PRN Gertha Calkin, MD   650 mg at 09/09/23 1324   Or    acetaminophen (TYLENOL) suppository 650 mg  650 mg Rectal Q6H PRN Gertha Calkin, MD       albuterol (PROVENTIL) (2.5 MG/3ML) 0.083% nebulizer solution 2.5 mg  2.5 mg Inhalation Q6H PRN Gertha Calkin, MD       ALPRAZolam Prudy Feeler) tablet 0.25 mg  0.25 mg Oral TID PRN Darlin Priestly, MD       DULoxetine (CYMBALTA) DR capsule 60 mg  60 mg Oral Daily Gertha Calkin, MD   60 mg at 09/09/23 0950   gabapentin (NEURONTIN) capsule 600 mg  600 mg Oral TID Gertha Calkin, MD   600 mg at 09/09/23 0950   morphine (PF) 2 MG/ML injection 2 mg  2 mg Intravenous Q3H PRN Darlin Priestly, MD   2 mg at 09/09/23 0951   ondansetron (ZOFRAN) 8 mg in sodium chloride 0.9 % 50 mL IVPB  8 mg Intravenous Q8H PRN Gertha Calkin, MD       oxyCODONE-acetaminophen (PERCOCET/ROXICET) 5-325 MG per tablet 1 tablet  1 tablet Oral Q6H PRN Foye Deer, [facility]       And   oxyCODONE (Oxy IR/ROXICODONE) immediate release tablet 5 mg  5 mg Oral Q6H PRN Foye Deer, RPH       pantoprazole (PROTONIX) injection 40 mg  40 mg Intravenous Q12H Irena Cords V, MD   40 mg at 09/09/23 0947   piperacillin-tazobactam (ZOSYN) IVPB 3.375 g  3.375 g Intravenous Q8H Gertha Calkin, MD   Stopped at 09/09/23 0943   sodium chloride flush (NS) 0.9 % injection 3 mL  3 mL Intravenous Q12H Irena Cords V, MD   3 mL at 09/09/23 4010   Current Outpatient Medications  Medication Sig Dispense Refill   albuterol (VENTOLIN HFA) 108 (90 Base) MCG/ACT inhaler Inhale 2 puffs into the lungs every 6 (six) hours as needed for wheezing or shortness of breath.     amLODipine (NORVASC) 10 MG tablet Take 10 mg by mouth daily.     cyanocobalamin (VITAMIN B12) 500 MCG tablet Take 500 mcg by mouth daily.     cyclobenzaprine (FLEXERIL) 10 MG tablet Take 10 mg by mouth at bedtime as needed for muscle spasms.     Doxepin HCl 6 MG TABS Take 6 mg by mouth daily as needed.     DULoxetine (CYMBALTA) 60 MG capsule Take 60 mg by mouth daily.     gabapentin (NEURONTIN) 300 MG capsule Take 2 capsules  (600 mg total) by mouth 3 (three) times daily. 180 capsule 0   hydrocortisone 2.5 % cream Apply 1 Application topically 2 (two) times daily as needed (skin irritation).     lidocaine-prilocaine (EMLA) cream APPLY TO AFFECTED AREA ONCE AS DIRECTED 30 g 3   megestrol (MEGACE) 40 MG tablet TAKE 1 TABLET BY MOUTH EVERY DAY 30 tablet 2   Multiple Vitamin (MULTIVITAMIN) tablet Take 1 tablet by mouth daily.     naloxone (NARCAN) nasal spray 4 mg/0.1 mL Place 1 spray into the nose once.     ondansetron (ZOFRAN) 8 MG tablet Take 1 tablet (8 mg total) by mouth every 8 (eight)  hours as needed for nausea or vomiting. Start on the third day after chemotherapy. 60 tablet 2   oxyCODONE-acetaminophen (PERCOCET) 10-325 MG tablet Take 1 tablet by mouth every 6 (six) hours as needed for pain.     polyethylene glycol (MIRALAX / GLYCOLAX) 17 g packet Take 17 g by mouth daily as needed. 14 each 0   prochlorperazine (COMPAZINE) 10 MG tablet Take 1 tablet (10 mg total) by mouth every 6 (six) hours as needed for nausea or vomiting. 60 tablet 2   traMADol (ULTRAM-ER) 300 MG 24 hr tablet Take 300 mg by mouth daily.     valGANciclovir (VALCYTE) 450 MG tablet Take 450 mg by mouth 2 (two) times daily.     Vitamin D, Ergocalciferol, (DRISDOL) 1.25 MG (50000 UNIT) CAPS capsule Take 50,000 Units by mouth every 7 (seven) days.     feeding supplement (ENSURE ENLIVE / ENSURE PLUS) LIQD Take 237 mLs by mouth 3 (three) times daily between meals. 237 mL 12   Facility-Administered Medications Ordered in Other Encounters  Medication Dose Route Frequency Provider Last Rate Last Admin   heparin lock flush 100 unit/mL  500 Units Intravenous Once Borders, Ivin Booty R, NP       sodium chloride flush (NS) 0.9 % injection 10 mL  10 mL Intracatheter PRN Jeralyn Ruths, MD   10 mL at 06/29/23 1430    OBJECTIVE: Vitals:   09/09/23 0700 09/09/23 1120  BP:    Pulse:    Resp:    Temp: 97.9 F (36.6 C) 97.8 F (36.6 C)  SpO2:       Body  mass index is 25.18 kg/m.    ECOG FS:3 - Symptomatic, >50% confined to bed  General: Well-developed, well-nourished, no acute distress. Eyes: Pink conjunctiva, anicteric sclera. HEENT: Normocephalic, moist mucous membranes. Lungs: No audible wheezing or coughing. Heart: Regular rate and rhythm. Abdomen: Soft, no obvious distention.   Musculoskeletal: No edema, cyanosis, or clubbing. Neuro: Alert, answering all questions appropriately. Cranial nerves grossly intact. Skin: No rashes or petechiae noted. Psych: Normal affect.  LAB RESULTS:  Lab Results  Component Value Date   NA 129 (L) 09/09/2023   K 3.6 09/09/2023   CL 100 09/09/2023   CO2 18 (L) 09/09/2023   GLUCOSE 90 09/09/2023   BUN 6 09/09/2023   CREATININE 0.36 (L) 09/09/2023   CALCIUM 8.6 (L) 09/09/2023   PROT 7.0 09/09/2023   ALBUMIN 3.0 (L) 09/09/2023   AST 175 (H) 09/09/2023   ALT 229 (H) 09/09/2023   ALKPHOS 1,447 (H) 09/09/2023   BILITOT 0.5 09/09/2023   GFRNONAA >60 09/09/2023   GFRAA >60 09/27/2018    Lab Results  Component Value Date   WBC 3.9 (L) 09/09/2023   NEUTROABS 3.6 09/08/2023   HGB 7.7 (L) 09/09/2023   HCT 24.0 (L) 09/09/2023   MCV 94.1 09/09/2023   PLT 116 (L) 09/09/2023     STUDIES: CT ABDOMEN PELVIS W CONTRAST  Result Date: 09/08/2023 CLINICAL DATA:  Elevated liver enzymes, nausea and diarrhea for 3 days, cholelithiasis with acute cholecystitis on previous imaging, history of metastatic adrenal carcinoma EXAM: CT ABDOMEN AND PELVIS WITH CONTRAST TECHNIQUE: Multidetector CT imaging of the abdomen and pelvis was performed using the standard protocol following bolus administration of intravenous contrast. RADIATION DOSE REDUCTION: This exam was performed according to the departmental dose-optimization program which includes automated exposure control, adjustment of the mA and/or kV according to patient size and/or use of iterative reconstruction technique. CONTRAST:  OMNIPAQUE IOHEXOL  300  MG/ML  SOLN COMPARISON:  08/21/2023, 08/20/2023 FINDINGS: Lower chest: Stable bibasilar hypoventilatory changes and scarring. No acute pleural or parenchymal lung disease. Hepatobiliary: There is a new 1 cm hypodensity within the left lobe liver, corresponding to hypoechoic lesion seen on today's ultrasound. Given history of metastatic adrenal carcinoma, new metastatic lesion is suspected. Multiple gallstones are again identified, with progressive gallbladder wall thickening and intramural edema consistent with acute cholecystitis. No evidence of choledocholithiasis. Pancreas: Unremarkable. No pancreatic ductal dilatation or surrounding inflammatory changes. Spleen: Normal in size without focal abnormality. Adrenals/Urinary Tract: The adrenals are unremarkable. The kidneys enhance normally. There is new mild left-sided hydronephrosis, with mucosal enhancement at the left UPJ suggesting obstructing mucosal lesion, reference image 43/2. No right-sided obstruction. No urinary tract calculi. The bladder is unremarkable. Stomach/Bowel: No bowel obstruction or ileus. Normal appendix right lower quadrant. No bowel wall thickening or inflammatory change. Vascular/Lymphatic: Stable atherosclerosis of the aorta. There is persistent retroperitoneal lymphadenopathy consistent with metastatic disease. Index left para-aortic lymph node image 44/2 measures 8 mm in short axis, stable. No new adenopathy. Reproductive: 5.1 x 3.9 cm left adnexal mass not appreciably changed since prior exam. Right ovary is unremarkable and stable. Stable uterine fibroid. Other: Trace pelvic free fluid unchanged. No free intraperitoneal gas. No abdominal wall hernia. Musculoskeletal: Diffuse sclerosis throughout the visualized bony structures compatible with bony metastases. No acute or pathologic fracture. Reconstructed images demonstrate no additional findings. IMPRESSION: 1. Cholelithiasis with progressive gallbladder wall thickening and intramural  edema consistent with worsening acute cholecystitis. 2. New 1 cm hypodensity within the left lobe liver, concerning for new liver metastasis. This was not evident on recent CT or MRI. 3. Persistent metastatic disease, with diffuse sclerotic bony metastases and retroperitoneal adenopathy unchanged. 4. Stable indeterminate left adnexal mass. 5. New mild left hydronephrosis, with subtle mucosal enhancement within the left ureter at the UPJ. Mucosal lesion cannot be excluded, and urologic consultation may be useful. 6.  Aortic Atherosclerosis (ICD10-I70.0). Electronically Signed   By: Sharlet Salina M.D.   On: 09/08/2023 21:43   US Abdomen Limited RUQ (LIVER/GB)  Result Date: 09/08/2023 CLINICAL DATA:  Quadrant pain for 3 days EXAM: ULTRASOUND ABDOMEN LIMITED RIGHT UPPER QUADRANT COMPARISON:  08/20/2023 FINDINGS: Gallbladder: Multiple shadowing gallstones are seen filling the gallbladder lumen. Gallbladder wall remains thickened measuring up to 8 mm, with prominent intramural edema now noted. Negative sonographic Murphy sign. Common bile duct: Diameter: 11 mm Liver: Normal liver echotexture. There is a circumscribed 1.3 cm hypoechoic area within the left lobe liver, without associated abnormality on prior CT or MRI. Portal vein is patent on color Doppler imaging with normal direction of blood flow towards the liver. Other: None. IMPRESSION: 1. Cholelithiasis, with worsening gallbladder wall thickening compatible with progressive acute cholecystitis. 2. Stable dilated common bile duct. 3. Indeterminate 1.3 cm hypoechoic area left lobe liver, not clearly seen on previous CT or MRI. Electronically Signed   By: Sharlet Salina M.D.   On: 09/08/2023 20:59   MR ABDOMEN MRCP W WO CONTAST  Result Date: 08/21/2023 CLINICAL DATA:  47 year old female with history of transaminitis and abdominal pain. Gallstones. Evaluate for choledocholithiasis. EXAM: MRI ABDOMEN WITHOUT AND WITH CONTRAST (INCLUDING MRCP) TECHNIQUE:  Multiplanar multisequence MR imaging of the abdomen was performed both before and after the administration of intravenous contrast. Heavily T2-weighted images of the biliary and pancreatic ducts were obtained, and three-dimensional MRCP images were rendered by post processing. CONTRAST:  7mL GADAVIST GADOBUTROL 1 MMOL/ML IV SOLN COMPARISON:  No  prior abdominal MRI. CT of the abdomen and pelvis 08/20/2023. FINDINGS: Lower chest: Elevation of the right hemidiaphragm. Multiple prominent borderline enlarged juxta diaphragmatic lymph nodes are noted on the right measuring up to 8 mm in short axis (axial image 21 of series 26). Hepatobiliary: Multiple filling defects are noted within the gallbladder, most notably a 1.9 cm filling defect near the neck of the gallbladder, indicative of gallstones. Gallbladder is severely distended. Gallbladder wall appears thickened and edematous with trace volume of pericholecystic fluid. MRCP images are limited by patient respiratory motion. With this limitation in mind, no definite filling defect within the common bile duct to suggest choledocholithiasis. Common bile duct is dilated measuring up to 11 mm. There is abrupt tapering of the distal common bile duct shortly above the level of the ampulla, suggesting a benign stricture. No intrahepatic biliary ductal dilatation noted at this time. Mild increased T2 signal intensity in a periportal distribution, indicative of periportal edema. No suspicious cystic or solid hepatic lesions. Pancreas: No pancreatic mass. No pancreatic ductal dilatation. No pancreatic or peripancreatic fluid collections or inflammatory changes. Spleen:  Unremarkable. Adrenals/Urinary Tract: Bilateral kidneys and adrenal glands are normal in appearance. No hydroureteronephrosis in the visualized portions of the abdomen. Stomach/Bowel: Visualized portions are unremarkable. Vascular/Lymphatic: Atherosclerosis of the abdominal aorta. No aneurysm identified in the  visualized abdominal vasculature. No lymphadenopathy noted in the abdomen. Other: No significant volume of ascites noted in the visualized portions of the peritoneal cavity. Musculoskeletal: Innumerable small areas of heterogeneous enhancement noted throughout the visualized axial skeleton, indicative of widespread metastatic disease to the bones. Multiple prominent borderline enlarged enhancing lymph nodes in the lower left axial region partially imaged. IMPRESSION: 1. Cholelithiasis with evidence of acute cholecystitis. Surgical consultation is recommended. 2. No choledocholithiasis. 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 This is associated with common bile duct dilatation, but no frank intrahepatic biliary ductal dilatation is noted at this time. 3. Diffuse periportal edema in the liver. 4. Borderline enlarged juxta diaphragmatic lymph nodes and left axillary lymph nodes, likely metastatic. Widespread metastatic disease to the bones redemonstrated. Electronically Signed   By: Trudie Reed M.D.   On: 08/21/2023 07:41   MR 3D Recon At Scanner  Result Date: 08/21/2023 CLINICAL DATA:  47 year old female with history of transaminitis and abdominal pain. Gallstones. Evaluate for choledocholithiasis. EXAM: MRI ABDOMEN WITHOUT AND WITH CONTRAST (INCLUDING MRCP) TECHNIQUE: Multiplanar multisequence MR imaging of the abdomen was performed both before and after the administration of intravenous contrast. Heavily T2-weighted images of the biliary and pancreatic ducts were obtained, and three-dimensional MRCP images were rendered by post processing. CONTRAST:  7mL GADAVIST GADOBUTROL 1 MMOL/ML IV SOLN COMPARISON:  No prior abdominal MRI. CT of the abdomen and pelvis 08/20/2023. FINDINGS: Lower chest: Elevation of the right hemidiaphragm. Multiple prominent borderline enlarged juxta diaphragmatic lymph nodes are noted on the right measuring up to 8 mm in short axis (axial  image 21 of series 26). Hepatobiliary: Multiple filling defects are noted within the gallbladder, most notably a 1.9 cm filling defect near the neck of the gallbladder, indicative of gallstones. Gallbladder is severely distended. Gallbladder wall appears thickened and edematous with trace volume of pericholecystic fluid. MRCP images are limited by patient respiratory motion. With this limitation in mind, no definite filling defect within the common bile duct to suggest choledocholithiasis. Common bile duct is dilated measuring up to 11 mm. There is abrupt tapering of the distal common bile duct shortly  above the level of the ampulla, suggesting a benign stricture. No intrahepatic biliary ductal dilatation noted at this time. Mild increased T2 signal intensity in a periportal distribution, indicative of periportal edema. No suspicious cystic or solid hepatic lesions. Pancreas: No pancreatic mass. No pancreatic ductal dilatation. No pancreatic or peripancreatic fluid collections or inflammatory changes. Spleen:  Unremarkable. Adrenals/Urinary Tract: Bilateral kidneys and adrenal glands are normal in appearance. No hydroureteronephrosis in the visualized portions of the abdomen. Stomach/Bowel: Visualized portions are unremarkable. Vascular/Lymphatic: Atherosclerosis of the abdominal aorta. No aneurysm identified in the visualized abdominal vasculature. No lymphadenopathy noted in the abdomen. Other: No significant volume of ascites noted in the visualized portions of the peritoneal cavity. Musculoskeletal: Innumerable small areas of heterogeneous enhancement noted throughout the visualized axial skeleton, indicative of widespread metastatic disease to the bones. Multiple prominent borderline enlarged enhancing lymph nodes in the lower left axial region partially imaged. IMPRESSION: 1. Cholelithiasis with evidence of acute cholecystitis. Surgical consultation is recommended. 2. No choledocholithiasis. There appears to  be a benign stricture of the distal common bile duct, as above. This is associated with common bile duct dilatation, but no frank intrahepatic biliary ductal dilatation is noted at this time. 3. Diffuse periportal edema in the liver. 4. Borderline enlarged juxta diaphragmatic lymph nodes and left axillary lymph nodes, likely metastatic. Widespread metastatic disease to the bones redemonstrated. Electronically Signed   By: Trudie Reed M.D.   On: 08/21/2023 07:41   US Venous Img Lower Unilateral Left  Result Date: 08/21/2023 CLINICAL DATA:  Left lower extremity pain.  Abnormal CT. EXAM: LEFT LOWER EXTREMITY VENOUS DOPPLER ULTRASOUND TECHNIQUE: Gray-scale sonography with compression, as well as color and duplex ultrasound, were performed to evaluate the deep venous system(s) from the level of the common femoral vein through the popliteal and proximal calf veins. COMPARISON:  None Available. FINDINGS: VENOUS Normal compressibility of the common femoral, superficial femoral, and popliteal veins, as well as the visualized calf veins. Visualized portions of profunda femoral vein and great saphenous vein unremarkable. No filling defects to suggest DVT on grayscale or color Doppler imaging. Doppler waveforms show normal direction of venous flow, normal respiratory plasticity and response to augmentation. Limited views of the contralateral common femoral vein are unremarkable. OTHER None. Limitations: none IMPRESSION: No evidence of left lower extremity DVT. Electronically Signed   By: Narda Rutherford M.D.   On: 08/21/2023 00:08   US ABDOMEN LIMITED RUQ (LIVER/GB)  Result Date: 08/21/2023 CLINICAL DATA:  Abdominal pain. EXAM: ULTRASOUND ABDOMEN LIMITED RIGHT UPPER QUADRANT COMPARISON:  CT earlier today FINDINGS: Gallbladder: Moderately distended containing multiple intraluminal stones and sludge. Edematous gallbladder wall thickening at 5 mm. No sonographic Murphy sign noted by sonographer. Common bile duct:  Diameter: 9 mm.  No visualized choledocholithiasis. Liver: There is central intrahepatic biliary ductal dilatation. No focal lesion identified. Within normal limits in parenchymal echogenicity. Portal vein is patent on color Doppler imaging with normal direction of blood flow towards the liver. Other: No definite pericholecystic fluid by ultrasound. IMPRESSION: 1. Sonographic findings suspicious for acute cholecystitis. Gallstones and sludge with edematous gallbladder wall thickening. 2. Dilated common bile duct with intrahepatic biliary ductal dilatation. No visualized choledocholithiasis. MRCP could be performed for further assessment of the biliary tree as clinically indicated. Electronically Signed   By: Narda Rutherford M.D.   On: 08/21/2023 00:07   CT Angio Chest PE W and/or Wo Contrast  Result Date: 08/20/2023 CLINICAL DATA:  Pulmonary embolus suspected with high probability. Chest pain in the cancer  patient. EXAM: CT ANGIOGRAPHY CHEST WITH CONTRAST TECHNIQUE: Multidetector CT imaging of the chest was performed using the standard protocol during bolus administration of intravenous contrast. Multiplanar CT image reconstructions and MIPs were obtained to evaluate the vascular anatomy. RADIATION DOSE REDUCTION: This exam was performed according to the departmental dose-optimization program which includes automated exposure control, adjustment of the mA and/or kV according to patient size and/or use of iterative reconstruction technique. CONTRAST:  OMNIPAQUE IOHEXOL 350 MG/ML SOLN COMPARISON:  CT chest 02/03/2023.  PET-CT 07/06/2023 FINDINGS: Cardiovascular: Technically adequate study with good opacification of the central and segmental pulmonary arteries and mild motion artifact. No focal filling defects are demonstrated. No evidence of significant pulmonary embolus. Mild cardiac enlargement. No pericardial effusions. Normal caliber thoracic aorta. No aortic dissection. Mediastinum/Nodes: Right-sided  Infuse-A-Port with tip in the superior vena cava. Esophagus is decompressed. Thyroid gland is unremarkable. Prominent lymphadenopathy in the left axilla and left supraclavicular region. Left axillary lymph nodes measure up to about 2.3 cm short axis dimension. Lymph nodes are similar in size to prior PET-CT. No significant mediastinal lymphadenopathy. Lungs/Pleura: Motion artifact limits examination. There is patchy airspace disease throughout both lungs. This could indicate multifocal pneumonia or edema. No pleural effusions. No pneumothorax. Upper Abdomen: Enlarged lymph nodes in the cardiophrenic angle. No acute abnormalities demonstrated in the visualized upper abdomen. 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 Review of the MIP images confirms the above findings. IMPRESSION: 1. No evidence of significant pulmonary embolus. 2. Patchy airspace disease throughout both lungs likely representing edema or multifocal pneumonia. 3. Metastatic lymphadenopathy in the left axilla and supraclavicular region. 4. Diffuse sclerotic skeletal metastasis. Electronically Signed   By: Burman Nieves M.D.   On: 08/20/2023 21:42   CT ABDOMEN PELVIS W CONTRAST  Result Date: 08/20/2023 CLINICAL DATA:  Abdominal pain, acute, nonlocalized. Current cancer patient. Pain in abdomen. EXAM: CT ABDOMEN AND PELVIS WITH CONTRAST TECHNIQUE: Multidetector CT imaging of the abdomen and pelvis was performed using the standard protocol following bolus administration of intravenous contrast. RADIATION DOSE REDUCTION: This exam was performed according to the departmental dose-optimization program which includes automated exposure control, adjustment of the mA and/or kV according to patient size and/or use of iterative reconstruction technique. CONTRAST:  OMNIPAQUE IOHEXOL 350 MG/ML SOLN COMPARISON:  Ultrasound pelvis 07/22/2023, CT abdomen pelvis  07/22/2023, pet ct 07/06/23 FINDINGS: Lower chest: Please see separately dictated CT angiography chest 08/20/2023 Hepatobiliary: No focal liver abnormality. Multiple calcified gallstones within the gallbladder lumen. Question gallbladder wall thickening and pericholecystic fluid. No biliary dilatation. Pancreas: No focal lesion. Normal pancreatic contour. No surrounding inflammatory changes. No main pancreatic ductal dilatation. Spleen: Normal in size without focal abnormality. Adrenals/Urinary Tract: No adrenal nodule bilaterally. Bilateral kidneys enhance symmetrically. No hydronephrosis. No hydroureter. The urinary bladder is unremarkable. Stomach/Bowel: Stomach is within normal limits. No evidence of bowel wall thickening or dilatation. Appendix appears normal. Vascular/Lymphatic: Asymmetrically hypodense left femoral vein and external iliac vein compared to the right with interval increase in size compared to prior. No abdominal aorta or iliac aneurysm. Severe atherosclerotic plaque of the aorta and its branches. Persistent prominent but nonenlarged retroperitoneal and mesenteric lymph nodes. No abdominal, pelvic, or inguinal lymphadenopathy. Reproductive: Uterine fibroid (8:83). Right adnexa is unremarkable. Similar-appearing heterogeneous hypodense 5.4 x 4.6 cm left adnexal/ovarian mass. Other: Trace volume rectouterine pouch free fluid. No intraperitoneal free gas. No organized fluid collection. Musculoskeletal: No abdominal wall hernia or abnormality. Redemonstrate of diffuse axial and appendicular  sclerotic metastases. No acute displaced fracture. Multilevel degenerative changes of the spine. IMPRESSION: 1. Cholelithiasis with question gallbladder wall thickening and pericholecystic fluid. Recommend right upper quadrant ultrasound for further evaluation. 2. Asymmetrically hypodense left femoral vein and external iliac vein compared to the right with interval increase in size compared to prior. Query  underlying thrombus. Consider ultrasound if clinically indicated. 3. Similar-appearing heterogeneous hypodense 5.4 x 4.6 cm left adnexal/ovarian mass. 4. Trace volume rectouterine pouch free fluid. 5. Persistent diffuse axial and appendicular sclerotic metastases. 6. Uterine fibroid. 7. Please see separately dictated CT angiography chest 08/20/2023. Electronically Signed   By: Tish Frederickson M.D.   On: 08/20/2023 21:42   DG Chest 2 View  Result Date: 08/20/2023 CLINICAL DATA:  Chest pain. EXAM: CHEST - 2 VIEW COMPARISON:  07/22/2023. FINDINGS: Bilateral lung fields are clear. Bilateral costophrenic angles are clear. Normal cardio-mediastinal silhouette. No acute osseous abnormalities. Redemonstration of extensive sclerotic metastases throughout the imaged bones. No pathological fracture seen. The soft tissues are within normal limits. Right-sided CT Port-A-Cath is seen with its tip overlying the cavoatrial junction region. IMPRESSION: 1. No active cardiopulmonary disease. 2. Diffuse osseous metastases. Electronically Signed   By: Jules Schick M.D.   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  PLAN:    Signet ring adenocarcinoma: Patient has not received chemotherapy since July 18, 2023.  Recent imaging suggests stable disease although she has noted to have a new small focus in her liver.  Patient has an appointment on Monday, September 12, 2023 for consideration of treatment.  She has been instructed that if she is discharged over the weekend to keep this appointment as scheduled although treatment will likely be delayed. Liver dysfunction: Improving.  Does not appear to be related to underlying malignancy.  Possibly cholecystitis.  Appreciate surgical input.  No surgery is planned, but if there is not continued improvement would likely need to consider external  drainage.  Continue conservative management and symptomatic treatment. Cholecystitis: Patient does not appear to be on antibiotics at this time. Colitis: Patient has a history of CMV colitis, but recent CMV blood titers are negative.  Continue to symptomatically treat.  Patient was recently taking valganciclovir. Hyponatremia: Sodium levels are trending down slightly.  129 today, monitor. Leukopenia: Mild, monitor. Thrombocytopenia: Mild, monitor. Anemia: Patient's hemoglobin is trended down to 7.7.  If continues to trend down, consider 1 unit packed red blood cells.  Appreciate consult, will follow.   Jeralyn Ruths, MD   09/09/2023 12:36 PM

## 2023-09-09 NOTE — Progress Notes (Signed)
MEWS Progress Note  Patient Details Name: Sheryl Silva MRN: [id number] DOB: 27-Aug-1976 Today's Date: 09/09/2023   MEWS Flowsheet Documentation:  Assess: MEWS Score Temp: 98.3 F (36.8 C) BP: (!) 141/101 MAP (mmHg): 114 Pulse Rate: (!) 112 ECG Heart Rate: (!) 110 Resp: 17 Level of Consciousness: Alert SpO2: 97 % O2 Device: Room Air O2 Flow Rate (L/min): 2 L/min Assess: MEWS Score MEWS Temp: 0 MEWS Systolic: 0 MEWS Pulse: 2 MEWS RR: 0 MEWS LOC: 0 MEWS Score: 2 MEWS Score Color: Yellow Assess: SIRS CRITERIA SIRS Temperature : 0 SIRS Respirations : 0 SIRS Pulse: 1 SIRS WBC: 0 SIRS Score Sum : 1 Assess: if the MEWS score is Yellow or Red Were vital signs accurate and taken at a resting state?: Yes Does the patient meet 2 or more of the SIRS criteria?: No MEWS guidelines implemented : Yes, yellow Treat MEWS Interventions: Considered administering scheduled or prn medications/treatments as ordered Take Vital Signs Increase Vital Sign Frequency : Yellow: Q2hr x1, continue Q4hrs until patient remains green for 12hrs Escalate MEWS: Escalate: Yellow: Discuss with charge nurse and consider notifying provider and/or RRT        Flo Shanks 09/09/2023, 7:27 PM

## 2023-09-09 NOTE — Progress Notes (Signed)
Patient clinically stable post IR Cholecystostomy 10 FR tube placement per Dr Juliette Alcide, tolerated well, received Versed 2 mg along with Fentanyl 100 mcg IV for procedure. Report given to Er care nurse [id number],

## 2023-09-09 NOTE — Progress Notes (Signed)
  PROGRESS NOTE    Sheryl Silva  ZOX:[id number] DOB: 27-Feb-1976 DOA: 09/08/2023 PCP: [name], MD  [id number]  LOS: 0 days   Brief hospital course:   Assessment & Plan: Sheryl Silva is a 47 y.o. 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    Worsening acute cholecystitis  No complaints of worsening right upper quadrant pain, despite obvious worsening LFTs and imaging for cholecystitis.  --GenSurg consulted, pt is not a surgical candidate Plan: --Cholecystostomy with IR today. --cont zosyn  >>left groin pain --possibly due to left adnexal mass?   > Stage IV metastatic signet ring adenocarcinoma: --cont home Percocet PRN   >>H/O CMV colitis: Pt was on valtrex and will see if she is still on chemo meds verification.  Pt states she was on it and was taken off it today.  Pt states she is suppose to take it on Monday.  >>Anemia: --monitor Hgb, transfuse to keep Hgb >7   DVT prophylaxis: Lovenox SQ Code Status: Full code  Family Communication:  Level of care: Med-Surg Dispo:   The patient is from: home Anticipated d/c is to: home Anticipated d/c date is: 2-3 days   Subjective and Interval History:  Pt reported pain in her groin area, more left side.  No dysuria.  No upper abdominal pain.  Undergoing Cholecystostomy with IR today.   Objective: Vitals:   09/09/23 1630 09/09/23 1633 09/09/23 1640 09/09/23 1641  BP: (!) 135/91 (!)  123/93 (!) 149/93 (!) 149/93  Pulse: (!) 111 (!) 115 (!) 110 (!) 111  Resp: (!) 22 19 (!) 22 18  Temp:      TempSrc:      SpO2: 100%  98% 98%  Weight:      Height:        Intake/Output Summary (Last 24 hours) at 09/09/2023 1713 Last data filed at 09/09/2023 0232 Gross per 24 hour  Intake 1038.54 ml  Output --  Net 1038.54 ml   Filed Weights   09/08/23 1507  Weight: 70.8 kg    Examination:   Constitutional: NAD, AAOx3 HEENT: conjunctivae and lids normal, EOMI CV: No cyanosis.   RESP: normal respiratory effort, on RA Neuro: II - XII grossly intact.   Psych: Normal mood and affect.  Appropriate judgement and reason   Data Reviewed: I have personally reviewed labs and imaging studies  Time spent: 50 minutes  Darlin Priestly, MD [facility] If 7PM-7AM, please contact night-coverage 09/09/2023, 5:13 PM

## 2023-09-10 DIAGNOSIS — K810 Acute cholecystitis: Secondary | ICD-10-CM | POA: Diagnosis not present

## 2023-09-10 DIAGNOSIS — I10 Essential (primary) hypertension: Secondary | ICD-10-CM

## 2023-09-10 DIAGNOSIS — C801 Malignant (primary) neoplasm, unspecified: Secondary | ICD-10-CM

## 2023-09-10 DIAGNOSIS — C799 Secondary malignant neoplasm of unspecified site: Secondary | ICD-10-CM | POA: Diagnosis not present

## 2023-09-10 DIAGNOSIS — R1114 Bilious vomiting: Secondary | ICD-10-CM | POA: Diagnosis not present

## 2023-09-10 LAB — COMPREHENSIVE METABOLIC PANEL
ALT: 139 U/L — ABNORMAL HIGH (ref 0–44)
AST: 74 U/L — ABNORMAL HIGH (ref 15–41)
Albumin: 2.8 g/dL — ABNORMAL LOW (ref 3.5–5.0)
Alkaline Phosphatase: 1098 U/L — ABNORMAL HIGH (ref 38–126)
Anion gap: 11 (ref 5–15)
BUN: 8 mg/dL (ref 6–20)
CO2: 18 mmol/L — ABNORMAL LOW (ref 22–32)
Calcium: 8.2 mg/dL — ABNORMAL LOW (ref 8.9–10.3)
Chloride: 100 mmol/L (ref 98–111)
Creatinine, Ser: 0.54 mg/dL (ref 0.44–1.00)
GFR, Estimated: >60 mL/min (ref >60)
Glucose, Bld: 80 mg/dL (ref 70–99)
Potassium: 3.4 mmol/L — ABNORMAL LOW (ref 3.5–5.1)
Sodium: 129 mmol/L — ABNORMAL LOW (ref 135–145)
Total Bilirubin: 0.7 mg/dL (ref <1.2)
Total Protein: 6.5 g/dL (ref 6.5–8.1)

## 2023-09-10 LAB — CBC
HCT: 23.6 % — ABNORMAL LOW (ref 36.0–46.0)
Hemoglobin: 7.6 g/dL — ABNORMAL LOW (ref 12.0–15.0)
MCH: 30 pg (ref 26.0–34.0)
MCHC: 32.2 g/dL (ref 30.0–36.0)
MCV: 93.3 fL (ref 80.0–100.0)
Platelets: 125 10*3/uL — ABNORMAL LOW (ref 150–400)
RBC: 2.53 MIL/uL — ABNORMAL LOW (ref 3.87–5.11)
RDW: 19.1 % — ABNORMAL HIGH (ref 11.5–15.5)
WBC: 4.5 10*3/uL (ref 4.0–10.5)
nRBC: 2 % — ABNORMAL HIGH (ref 0.0–0.2)

## 2023-09-10 LAB — GLUCOSE, CAPILLARY: Glucose-Capillary: 72 mg/dL (ref 70–99)

## 2023-09-10 LAB — MAGNESIUM: Magnesium: 1.8 mg/dL (ref 1.7–2.4)

## 2023-09-10 MED ORDER — HYDROMORPHONE 1 MG/ML IV SOLN
INTRAVENOUS | Status: DC
Start: 2023-09-10 — End: 2023-09-14
  Administered 2023-09-10: 2.1 mg via INTRAVENOUS
  Administered 2023-09-10: 30 mg via INTRAVENOUS
  Administered 2023-09-10: 2.4 mg via INTRAVENOUS
  Administered 2023-09-11: 2.1 mg via INTRAVENOUS
  Administered 2023-09-11 (×2): 2.7 mg via INTRAVENOUS
  Administered 2023-09-11: 2.1 mg via INTRAVENOUS
  Administered 2023-09-12: 0.3 mg via INTRAVENOUS
  Administered 2023-09-12: 1.2 mg via INTRAVENOUS
  Administered 2023-09-12: 1.8 mg via INTRAVENOUS
  Administered 2023-09-12 – 2023-09-14 (×6): 0.3 mL via INTRAVENOUS
  Administered 2023-09-14: 0.3 mg via INTRAVENOUS
  Administered 2023-09-14 (×2): 0.3 mL via INTRAVENOUS
  Filled 2023-09-10 (×32): qty 30

## 2023-09-10 MED ORDER — DIPHENHYDRAMINE HCL 50 MG/ML IJ SOLN: 12.5000 mg | Freq: Four times a day (QID) | INTRAMUSCULAR | Status: DC | PRN

## 2023-09-10 MED ORDER — SODIUM CHLORIDE 0.9% FLUSH: 9.0000 mL | INTRAVENOUS | Status: DC | PRN

## 2023-09-10 MED ORDER — SODIUM CHLORIDE 0.9% FLUSH
5.0000 mL | Freq: Three times a day (TID) | INTRAVENOUS | Status: DC
  Administered 2023-09-10 – 2023-10-03 (×67): 5 mL

## 2023-09-10 MED ORDER — DIPHENHYDRAMINE HCL 12.5 MG/5ML PO ELIX: 12.5000 mg | ORAL_SOLUTION | Freq: Four times a day (QID) | ORAL | Status: DC | PRN

## 2023-09-10 MED ORDER — POTASSIUM CHLORIDE CRYS ER 20 MEQ PO TBCR
20.0000 meq | EXTENDED_RELEASE_TABLET | Freq: Once | ORAL | Status: AC
  Administered 2023-09-10: 20 meq via ORAL
  Filled 2023-09-10: qty 1

## 2023-09-10 MED ORDER — NALOXONE HCL 0.4 MG/ML IJ SOLN: 0.4000 mg | INTRAMUSCULAR | Status: DC | PRN

## 2023-09-10 MED ORDER — SODIUM CHLORIDE 0.9 % IV SOLN: INTRAVENOUS | Status: AC | PRN

## 2023-09-10 NOTE — Progress Notes (Addendum)
PROGRESS NOTE    Ankita Fabiano  UEA:[id number] DOB: 1976/10/29 DOA: 09/08/2023 PCP: [name], MD  113A/[id number]-AA  LOS: 1 day   Brief hospital course: Kollins Sabino is a 47 y.o. 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   11/9: S/p cholecystostomy drain in place, apparently cultures were not sent, ordered today, hopefully can get some answers as patient was already on antibiotics.  Worsening abdominal pain difficult to control, she was requesting morphine drip, started on Dilaudid PCA. Palliative care was also consulted.  Assessment & Plan:   Acute cholecystitis  Concern of acute cholecystitis with history of stage IV signet cell adenocarcinoma, general surgery was consulted but she is not a good candidate for any surgical intervention. S/p cholecystostomy drain placement by IR on 11/8, draining bile, apparently cultures were not sent so they were ordered today.  Complaining of worsening pain and requesting morphine drip. Improving transaminitis. -Continue with Zosyn -Follow-up cultures -Started on Dilaudid pump as requiring multiple pain medications.  left groin pain --possibly due to left adnexal mass?   Stage IV metastatic signet ring adenocarcinoma: -- Started on Dilaudid PCA due to worsening abdominal pain and requiring a lot of pain medication. -Palliative care was also consulted   H/O CMV colitis: Pt was on valtrex and will see if she is  still on chemo meds verification.  Pt states she was on it and was taken off it today.  Pt states she is suppose to take it on Monday.  Anemia: --monitor Hgb, transfuse to keep Hgb >7   DVT prophylaxis: Lovenox SQ Code Status: Full code  Family Communication:  Level of care: Med-Surg Dispo:   The patient is from: home Anticipated d/c is to: home Anticipated d/c date is: 2-3 days   Subjective and Interval History:  Patient was complaining of a lot of abdominal pain, stating that nothing is helping.  Objective: Vitals:   09/10/23 1050 09/10/23 1121 09/10/23 1200 09/10/23 1300  BP:  114/69    Pulse:  (!) 101    Resp: 18 20    Temp:  98.1 F (36.7 C)    TempSrc:  Oral    SpO2: 100% 100% 98% 96%  Weight:      Height:        Intake/Output Summary (Last 24 hours) at 09/10/2023 1505 Last data filed at 09/10/2023 0600 Gross per 24 hour  Intake 143.84 ml  Output 400 ml  Net -256.16 ml   Filed Weights   09/08/23 1507  Weight: 70.8 kg    Examination:   General.  Frail lady, in no acute distress. Pulmonary.  Lungs clear bilaterally, normal respiratory effort. CV.  Regular rate and rhythm, no JVD, rub or murmur. Abdomen.  Soft, mild diffuse tenderness, nondistended, BS positive.  Cholecystostomy drain with bilious secretions in place. CNS.  Alert and oriented .  No focal neurologic deficit. Extremities.  No edema, no cyanosis, pulses intact and symmetrical. Psychiatry.  Judgment and insight appears normal.    Data Reviewed: I have personally reviewed labs and imaging studies  Time spent: 50 minutes  Arnetha Courser, MD [facility] If 7PM-7AM, please contact night-coverage 09/10/2023, 3:05 PM

## 2023-09-10 NOTE — Plan of Care (Addendum)
Patient is alert and oriented X 4. She has been receiving dilaudid via PCA pump  for pain management as order.vitals with in the normal range. She also got xanax for anxiety. Catheter flush done as order.dressing site looks clean, dry and intact. Problem: Education: Goal: Knowledge of General Education information will improve Description: Including pain rating scale, medication(s)/side effects and non-pharmacologic comfort measures Outcome: Progressing   Problem: Health Behavior/Discharge Planning: Goal: Ability to manage health-related needs will improve Outcome: Progressing   Problem: Clinical Measurements: Goal: Ability to maintain clinical measurements within normal limits will improve Outcome: Progressing Goal: Will remain free from infection Outcome: Progressing Goal: Diagnostic test results will improve Outcome: Progressing Goal: Respiratory complications will improve Outcome: Progressing Goal: Cardiovascular complication will be avoided Outcome: Progressing   Problem: Activity: Goal: Risk for activity intolerance will decrease Outcome: Progressing   Problem: Nutrition: Goal: Adequate nutrition will be maintained Outcome: Progressing   Problem: Coping: Goal: Level of anxiety will decrease Outcome: Progressing   Problem: Elimination: Goal: Will not experience complications related to bowel motility Outcome: Progressing Goal: Will not experience complications related to urinary retention Outcome: Progressing   Problem: Pain Management: Goal: General experience of comfort will improve Outcome: Progressing   Problem: Safety: Goal: Ability to remain free from injury will improve Outcome: Progressing   Problem: Skin Integrity: Goal: Risk for impaired skin integrity will decrease Outcome: Progressing

## 2023-09-10 NOTE — TOC CM/SW Note (Signed)
Transition of Care [facility]) - Inpatient Brief Assessment   Patient Details  Name: Sheryl Silva MRN: [id number] Date of Birth: 07/11/1976  [facility] of [facility]) CM/SW Contact:    Kemper Durie, RN Phone Number: 09/10/2023, 1:53 PM   Clinical Narrative:  Brief assessment done, no TOC needs identified at this time.  Please consult TOC team should needs arise.   Transition of Care Asessment: Insurance and Status: Insurance coverage has been reviewed Patient has primary care physician: Yes Home environment has been reviewed: Yes Prior level of function:: Independent Prior/Current Home Services: No current home services Social Determinants of Health Reivew: SDOH reviewed no interventions necessary Readmission risk has been reviewed: Yes Transition of care needs: no transition of care needs at this time

## 2023-09-10 NOTE — Progress Notes (Signed)
MEWS Progress Note  Patient Details Name: Sheryl Silva MRN: [id number] DOB: [birth year] Today's Date: 09/10/2023   MEWS Flowsheet Documentation:  Assess: MEWS Score Temp: (!) 101 F (38.3 C) BP: 139/88 MAP (mmHg): 101 Pulse Rate: (!) 127 ECG Heart Rate: (!) 110 Resp: (!) 26 Level of Consciousness: Alert SpO2: 100 % O2 Device: Room Air O2 Flow Rate (L/min): 2 L/min Assess: MEWS Score MEWS Temp: 1 MEWS Systolic: 0 MEWS Pulse: 2 MEWS RR: 2 MEWS LOC: 0 MEWS Score: 5 MEWS Score Color: Red Assess: SIRS CRITERIA SIRS Temperature : 1 SIRS Respirations : 1 SIRS Pulse: 1 SIRS WBC: 0 SIRS Score Sum : 3 SIRS Temperature : 1 SIRS Pulse: 1 SIRS Respirations : 1 SIRS WBC: 0 SIRS Score Sum : 3 Assess: if the MEWS score is Yellow or Red Were vital signs accurate and taken at a resting state?: Yes Does the patient meet 2 or more of the SIRS criteria?: Yes MEWS guidelines implemented : Yes, red Treat MEWS Interventions: Considered administering scheduled or prn medications/treatments as ordered Take Vital Signs Increase Vital Sign Frequency : Red: Q1hr x2, continue Q4hrs until patient remains green for 12hrs Escalate MEWS: Escalate: Red: Discuss with charge nurse and notify provider. Consider notifying RRT. If remains red for 2 hours consider need for higher level of care Provider Notification Provider Name/Title: Manuela Schwartz, NP Date Provider Notified: 09/10/23 Time Provider Notified: 0024 Method of Notification: Page, Call Notification Reason: Change in status Provider response: Other (Comment) (RN to administer prn tylenol per the order) Date of Provider Response: 09/10/23 Time of Provider Response: 0023      Ernest Mallick 09/10/2023, 12:31 AM

## 2023-09-10 NOTE — Progress Notes (Signed)
MEWS Progress Note  Patient Details Name: Sheryl Silva MRN: [id number] DOB: Apr 23, 1976 Today's Date: 09/10/2023   MEWS Flowsheet Documentation:  Assess: MEWS Score Temp: 98.6 F (37 C) BP: (!) 148/93 MAP (mmHg): 109 Pulse Rate: (!) 113 ECG Heart Rate: (!) 110 Resp: 18 Level of Consciousness: Alert SpO2: 100 % O2 Device: Nasal Cannula O2 Flow Rate (L/min): 1 L/min Assess: MEWS Score MEWS Temp: 0 MEWS Systolic: 0 MEWS Pulse: 2 MEWS RR: 0 MEWS LOC: 0 MEWS Score: 2 MEWS Score Color: Yellow Assess: SIRS CRITERIA SIRS Temperature : 0 SIRS Respirations : 0 SIRS Pulse: 1 SIRS WBC: 0 SIRS Score Sum : 1 SIRS Temperature : 0 SIRS Pulse: 1 SIRS Respirations : 0 SIRS WBC: 0 SIRS Score Sum : 1 Assess: if the MEWS score is Yellow or Red Were vital signs accurate and taken at a resting state?: Yes Does the patient meet 2 or more of the SIRS criteria?: No Does the patient have a confirmed or suspected source of infection?: Yes MEWS guidelines implemented : No, previously yellow, continue vital signs every 4 hours Treat MEWS Interventions: Considered administering scheduled or prn medications/treatments as ordered Take Vital Signs Increase Vital Sign Frequency : Red: Q1hr x2, continue Q4hrs until patient remains green for 12hrs Escalate MEWS: Escalate: Red: Discuss with charge nurse and notify provider. Consider notifying RRT. If remains red for 2 hours consider need for higher level of care Notify: Charge Nurse/RN Name of Charge Nurse/RN Notified: Corrie Dandy, RN      Ernest Mallick 09/10/2023, 11:42 PM

## 2023-09-10 NOTE — Plan of Care (Signed)

## 2023-09-10 NOTE — Progress Notes (Signed)
09/10/2023  Subjective: Patient had a percutaneous cholecystostomy drain placed yesterday by interventional radiology.  The patient reports having a lot of pain in the right upper quadrant at the drain insertion site.  WBC is normal today at 4.5, hemoglobin stable at 7.6.  AST, ALT, and alkaline phosphatase are improving today.  Vital signs: Temp:  [97.8 F (36.6 C)-101 F (38.3 C)] 99 F (37.2 C) (11/09 0957) Pulse Rate:  [105-127] 105 (11/09 0957) Resp:  [13-26] 18 (11/09 0957) BP: (116-149)/(75-109) 137/87 (11/09 0957) SpO2:  [97 %-100 %] 100 % (11/09 0957)   Intake/Output: 11/08 0701 - 11/09 0700 In: 143.8 [I.V.:3; IV Piggyback:140.8] Out: 400 [Drains:400] Last BM Date : 09/09/23  Physical Exam: Constitutional: No acute distress Abdomen: Soft, nondistended, with tenderness to palpation of the right upper quadrant particular at the drain insertion site.  Overall the drain does not appear to be kinked and there is bile in the bag and tubing.  Labs:  Recent Labs    09/09/23 0735 09/09/23 2223 09/10/23 0550  WBC 3.9*  --  4.5  HGB 7.7* 7.5* 7.6*  HCT 24.0*  --  23.6*  PLT 116*  --  125*   Recent Labs    09/09/23 0735 09/10/23 0550  NA 129* 129*  K 3.6 3.4*  CL 100 100  CO2 18* 18*  GLUCOSE 90 80  BUN 6 8  CREATININE 0.36* 0.54  CALCIUM 8.6* 8.2*   Recent Labs    09/08/23 1948  LABPROT 15.0  INR 1.2    Imaging: No results found.  Assessment/Plan: This is a 47 y.o. female with acute cholecystitis in the setting of metastatic Signet ring cell adenocarcinoma.  - Discussed with patient that the pain appears to be related strictly to the drain itself.  She has been started on a PCA for further pain control.  Have also placed flushing orders for the catheter to prevent any clogging.  Dr. Nelson Chimes is sending cultures from the bile as well. - Have started the patient on a clear liquid diet.  This could be advanced as tolerated potentially to a full liquid diet for  dinner. - Continue IV antibiotics for management of her cholecystitis.   I spent 35 minutes dedicated to the care of this patient on the date of this encounter to include pre-visit review of records, face-to-face time with the patient discussing diagnosis and management, and any post-visit coordination of care.  Howie Ill, MD [facility]

## 2023-09-10 NOTE — Progress Notes (Signed)
   09/10/23 0022  Assess: MEWS Score  Temp (!) 101 F (38.3 C)  BP 139/88  MAP (mmHg) 101  Pulse Rate (!) 127  Resp (!) 26  SpO2 100 %  O2 Device Room Air  Assess: MEWS Score  MEWS Temp 1  MEWS Systolic 0  MEWS Pulse 2  MEWS RR 2  MEWS LOC 0  MEWS Score 5  MEWS Score Color Red  Assess: if the MEWS score is Yellow or Red  Were vital signs accurate and taken at a resting state? Yes  Does the patient meet 2 or more of the SIRS criteria? Yes  MEWS guidelines implemented  Yes, red  Treat  MEWS Interventions Considered administering scheduled or prn medications/treatments as ordered  Take Vital Signs  Increase Vital Sign Frequency  Red: Q1hr x2, continue Q4hrs until patient remains green for 12hrs  Escalate  MEWS: Escalate Red: Discuss with charge nurse and notify provider. Consider notifying RRT. If remains red for 2 hours consider need for higher level of care  Provider Notification  Provider Name/Title Manuela Schwartz, NP  Date Provider Notified 09/10/23  Time Provider Notified 0024  Method of Notification Page;Call  Notification Reason Change in status  Provider response Other (Comment) (RN to administer prn tylenol per the order)  Date of Provider Response 09/10/23  Time of Provider Response 0023  Assess: SIRS CRITERIA  SIRS Temperature  1  SIRS Pulse 1  SIRS Respirations  1  SIRS WBC 0  SIRS Score Sum  3   RED MEWS

## 2023-09-11 ENCOUNTER — Inpatient Hospital Stay: Payer: 59

## 2023-09-11 ENCOUNTER — Other Ambulatory Visit: Payer: Self-pay | Admitting: Oncology

## 2023-09-11 DIAGNOSIS — Z515 Encounter for palliative care: Secondary | ICD-10-CM

## 2023-09-11 DIAGNOSIS — R7401 Elevation of levels of liver transaminase levels: Secondary | ICD-10-CM | POA: Diagnosis not present

## 2023-09-11 DIAGNOSIS — K810 Acute cholecystitis: Principal | ICD-10-CM

## 2023-09-11 DIAGNOSIS — C799 Secondary malignant neoplasm of unspecified site: Secondary | ICD-10-CM | POA: Diagnosis not present

## 2023-09-11 LAB — COMPREHENSIVE METABOLIC PANEL
ALT: 84 U/L — ABNORMAL HIGH (ref 0–44)
AST: 37 U/L (ref 15–41)
Albumin: 2.7 g/dL — ABNORMAL LOW (ref 3.5–5.0)
Alkaline Phosphatase: 833 U/L — ABNORMAL HIGH (ref 38–126)
Anion gap: 5 (ref 5–15)
BUN: 10 mg/dL (ref 6–20)
CO2: 20 mmol/L — ABNORMAL LOW (ref 22–32)
Calcium: 8.2 mg/dL — ABNORMAL LOW (ref 8.9–10.3)
Chloride: 102 mmol/L (ref 98–111)
Creatinine, Ser: 0.49 mg/dL (ref 0.44–1.00)
GFR, Estimated: >60 mL/min (ref >60)
Glucose, Bld: 89 mg/dL (ref 70–99)
Potassium: 3.7 mmol/L (ref 3.5–5.1)
Sodium: 127 mmol/L — ABNORMAL LOW (ref 135–145)
Total Bilirubin: 0.8 mg/dL (ref <1.2)
Total Protein: 6.7 g/dL (ref 6.5–8.1)

## 2023-09-11 LAB — CBC
HCT: 22.1 % — ABNORMAL LOW (ref 36.0–46.0)
Hemoglobin: 7.2 g/dL — ABNORMAL LOW (ref 12.0–15.0)
MCH: 30.3 pg (ref 26.0–34.0)
MCHC: 32.6 g/dL (ref 30.0–36.0)
MCV: 92.9 fL (ref 80.0–100.0)
Platelets: 113 10*3/uL — ABNORMAL LOW (ref 150–400)
RBC: 2.38 MIL/uL — ABNORMAL LOW (ref 3.87–5.11)
RDW: 18.8 % — ABNORMAL HIGH (ref 11.5–15.5)
WBC: 4.7 10*3/uL (ref 4.0–10.5)
nRBC: 2.1 % — ABNORMAL HIGH (ref 0.0–0.2)

## 2023-09-11 LAB — MAGNESIUM: Magnesium: 1.7 mg/dL (ref 1.7–2.4)

## 2023-09-11 MED ORDER — SODIUM CHLORIDE 0.9 % IV SOLN: INTRAVENOUS | Status: AC | PRN

## 2023-09-11 MED ORDER — FENTANYL 50 MCG/HR TD PT72
1.0000 | MEDICATED_PATCH | TRANSDERMAL | Status: DC
  Administered 2023-09-11 – 2023-09-14 (×2): 1 via TRANSDERMAL
  Filled 2023-09-11 (×2): qty 1

## 2023-09-11 NOTE — Progress Notes (Signed)
Pt received from Ultrasound via bed in stable condition.

## 2023-09-11 NOTE — Progress Notes (Signed)
Pt transferred to ultrasound with this RN via bed in stable condition.

## 2023-09-11 NOTE — Plan of Care (Signed)

## 2023-09-11 NOTE — Plan of Care (Addendum)
Patient is alert and oriented. She has a port in her right side of chest and getting inj dilaudid  via PCA pump for pain management. She also has a drain in her right side of abdomen. Dressing looks clean, dry and intact. Problem: Education: Goal: Knowledge of General Education information will improve Description: Including pain rating scale, medication(s)/side effects and non-pharmacologic comfort measures Outcome: Progressing   Problem: Health Behavior/Discharge Planning: Goal: Ability to manage health-related needs will improve Outcome: Progressing   Problem: Clinical Measurements: Goal: Ability to maintain clinical measurements within normal limits will improve Outcome: Progressing Goal: Will remain free from infection Outcome: Progressing Goal: Diagnostic test results will improve Outcome: Progressing Goal: Respiratory complications will improve Outcome: Progressing Goal: Cardiovascular complication will be avoided Outcome: Progressing   Problem: Activity: Goal: Risk for activity intolerance will decrease Outcome: Progressing   Problem: Nutrition: Goal: Adequate nutrition will be maintained Outcome: Progressing   Problem: Coping: Goal: Level of anxiety will decrease Outcome: Progressing   Problem: Elimination: Goal: Will not experience complications related to bowel motility Outcome: Progressing Goal: Will not experience complications related to urinary retention Outcome: Progressing   Problem: Pain Management: Goal: General experience of comfort will improve Outcome: Progressing   Problem: Safety: Goal: Ability to remain free from injury will improve Outcome: Progressing   Problem: Skin Integrity: Goal: Risk for impaired skin integrity will decrease Outcome: Progressing

## 2023-09-11 NOTE — Progress Notes (Signed)
09/11/2023  Subjective: No acute events overnight.  Patient reports that her pain today is better with the PCA.  Denies any nausea with the liquids.  Vital signs: Temp:  [97.7 F (36.5 C)-98.6 F (37 C)] 98.3 F (36.8 C) (11/10 0809) Pulse Rate:  [46-113] 109 (11/10 0809) Resp:  [16-20] 20 (11/10 0809) BP: (133-148)/(81-103) 141/88 (11/10 0809) SpO2:  [96 %-100 %] 96 % (11/10 0809) Weight:  [58.8 kg] 58.8 kg (11/10 0500)   Intake/Output: 11/09 0701 - 11/10 0700 In: 553.9 [P.O.:400; I.V.:49.9; IV Piggyback:104] Out: 60 [Drains:60] Last BM Date : 09/10/23  Physical Exam: Constitutional: No acute distress Abdomen: Soft, nondistended, appropriately tender to palpation in the right upper quadrant particular the drain insertion site.  Drain with bilious fluid.  Labs:  Recent Labs    09/10/23 0550 09/11/23 0600  WBC 4.5 4.7  HGB 7.6* 7.2*  HCT 23.6* 22.1*  PLT 125* 113*   Recent Labs    09/10/23 0550 09/11/23 0600  NA 129* 127*  K 3.4* 3.7  CL 100 102  CO2 18* 20*  GLUCOSE 80 89  BUN 8 10  CREATININE 0.54 0.49  CALCIUM 8.2* 8.2*   Recent Labs    09/08/23 1948  LABPROT 15.0  INR 1.2    Imaging: No results found.  Assessment/Plan: This is a 47 y.o. female with acute cholecystitis status post percutaneous cholecystostomy drain placement.  - The patient's pain is improving particularly with the addition of PCA.  Overall her LFTs are also improving and her white blood cell count remains normal.  She has tolerated clear liquid diet. - Will start her on a full liquid diet for breakfast and advance to a heart healthy diet for dinner. - Continue IV antibiotics for cholecystitis. - Continue flushing drain   I spent 35 minutes dedicated to the care of this patient on the date of this encounter to include pre-visit review of records, face-to-face time with the patient discussing diagnosis and management, and any post-visit coordination of care.  Howie Ill,  MD [facility]

## 2023-09-11 NOTE — Progress Notes (Signed)
PROGRESS NOTE    Sheryl Silva  WNU:[id number] DOB: 05/17/1976 DOA: 09/08/2023 PCP: [name], MD  113A/113A-AA  LOS: 2 days   Brief hospital course: Tenli Kado is a 47 y.o. 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   11/9: S/p cholecystostomy drain in place, apparently cultures were not sent, ordered today, hopefully can get some answers as patient was already on antibiotics.  Worsening abdominal pain difficult to control, she was requesting morphine drip, started on Dilaudid PCA. Palliative care was also consulted.  Assessment & Plan:   Acute cholecystitis  Concern of acute cholecystitis with history of stage IV signet cell adenocarcinoma, general surgery was consulted but she is not a good candidate for any surgical intervention. S/p cholecystostomy drain placement by IR on 11/8, draining bile, apparently cultures were not sent so they were ordered next day.  Improving transaminitis. Plan: -Continue with Zosyn --cont drain management.  Persistent pain left groin pain Complaining of worsening pain was started on PCA dilaudid on 11/9, however, pain does not appear to be from upper abdomen.  possibly due to left adnexal mass? --palliative care consulted, with onc  Plan: --US pelvic with doppler, which showed known left ovary mass with reduced blood flow --Gyn consult --start Fentanyl patch by palliative care provider --wean off PCA dilaudid    Stage IV metastatic signet ring adenocarcinoma: -- Started on Dilaudid PCA due to worsening abdominal pain and requiring a lot of pain medication. 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  Pt states she was on it and was taken off it today.  Pt states she is suppose to take it on Monday.  Anemia: --monitor Hgb, transfuse to keep Hgb >7  Hyponatremia --persistent since Aug 2024 --monitor   DVT prophylaxis: Lovenox SQ Code Status: Full code  Family Communication:  Level of care: Med-Surg Dispo:   The patient is from: home Anticipated d/c is to: home Anticipated d/c date is: 2-3 days   Subjective and Interval History:  US pelvic today found known mass in left ovary, with reduced blood flow, unknown chronicity.     Objective: Vitals:   09/11/23 1543 09/11/23 1606 09/11/23 1910 09/11/23 1938  BP: (!) 131/91   (!) 141/91  Pulse: (!) 109   (!) 113  Resp: 18 20 18 18   Temp: 98.3 F (36.8 C)   98.5 F (36.9 C)  TempSrc: Oral   Oral  SpO2: 100% 96% 99% 98%  Weight:      Height:        Intake/Output Summary (Last 24 hours) at 09/11/2023 2008 Last data filed at 09/11/2023 1928 Gross per 24 hour  Intake 911.45 ml  Output 150 ml  Net 761.45 ml   Filed Weights   09/08/23 1507 09/11/23 0500  Weight: 70.8 kg 58.8 kg    Examination:   Constitutional: NAD, AAOx3 HEENT: conjunctivae and lids normal, EOMI CV: No cyanosis.   RESP: normal respiratory effort Neuro: II -  XII grossly intact.     Data Reviewed: I have personally reviewed labs and imaging studies  Time spent: 50 minutes  Darlin Priestly, MD [facility] If 7PM-7AM, please contact night-coverage 09/11/2023, 8:08 PM

## 2023-09-11 NOTE — Progress Notes (Signed)
MEWS Progress Note  Patient Details Name: Lasean Salcido MRN: [id number] DOB: [birth year] Today's Date: 09/11/2023   MEWS Flowsheet Documentation:  Assess: MEWS Score Temp: 98.5 F (36.9 C) BP: (!) 141/91 MAP (mmHg): 104 Pulse Rate: (!) 113 ECG Heart Rate: (!) 110 Resp: 18 Level of Consciousness: Alert SpO2: 98 % O2 Device: Room Air Patient Activity (if Appropriate): In bed O2 Flow Rate (L/min): 1 L/min Assess: MEWS Score MEWS Temp: 0 MEWS Systolic: 0 MEWS Pulse: 2 MEWS RR: 0 MEWS LOC: 0 MEWS Score: 2 MEWS Score Color: Yellow Assess: SIRS CRITERIA SIRS Temperature : 0 SIRS Respirations : 0 SIRS Pulse: 1 SIRS WBC: 0 SIRS Score Sum : 1 SIRS Temperature : 0 SIRS Pulse: 1 SIRS Respirations : 0 SIRS WBC: 0 SIRS Score Sum : 1 Assess: if the MEWS score is Yellow or Red Were vital signs accurate and taken at a resting state?: Yes Does the patient meet 2 or more of the SIRS criteria?: No Does the patient have a confirmed or suspected source of infection?: Yes MEWS guidelines implemented : No, previously yellow, continue vital signs every 4 hours Treat MEWS Interventions: Considered administering scheduled or prn medications/treatments as ordered Take Vital Signs Increase Vital Sign Frequency : Red: Q1hr x2, continue Q4hrs until patient remains green for 12hrs Escalate MEWS: Escalate: Red: Discuss with charge nurse and notify provider. Consider notifying RRT. If remains red for 2 hours consider need for higher level of care Notify: Charge Nurse/RN Name of Charge Nurse/RN Notified: Jamesetta So, RN      Ernest Mallick 09/11/2023, 8:17 PM

## 2023-09-11 NOTE — Progress Notes (Signed)
OT Cancellation Note  Patient Details Name: Sheryl Silva MRN: [id number] DOB: November 29, 1975   Cancelled Treatment:    Reason Eval/Treat Not Completed: Pain limiting ability to participate. Pt reporting 8/10 pain, and is fatigued (receiving pain meds). Requests for OT to return tomorrow for evaluation as she feels like she cannot participate today. OT will return as able.   Marlaysia Lenig L. Wenceslaus Gist, OTR/L  09/11/23, 8:20 AM

## 2023-09-11 NOTE — Consult Note (Signed)
Palliative Medicine [facility] at [facility] [phone] [phone]   Name: Sheryl Silva Date: 09/11/2023 MRN: [id number]  DOB: May 10, 1976  Patient Care Team: [name], MD as PCP - General (Internal Medicine) Jim Like, RN as Registered Nurse Scarlett Presto, RN (Inactive) as Registered Nurse Benita Gutter, RN as Oncology Nurse Navigator Jeralyn Ruths, MD as Consulting Physician (Oncology) Johnnette Barrios, RN as VBC[facility] Care Management    REASON FOR CONSULTATION: Sheryl Silva is a 47 y.o. 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  Patient recently hospitalized 07/22/2023 to 08/04/2023 with CMV colitis treated with valganciclovir.  She was hospitalized again 08/20/2023 to 08/23/2023 with cholecystitis, which was treated conservatively.  Patient was readmitted 09/08/2023 with abdominal pain and again found to have acute cholecystitis.  Patient underwent cholecystostomy drain placed by IR on 11/8.  She has subsequently had ongoing abdominal pain.  Palliative care was consulted to address goals and manage ongoing symptoms.  SOCIAL HISTORY:     reports that she quit smoking about 8 months ago. Her smoking use included cigarettes. She has never used smokeless tobacco. She reports current alcohol use. She reports that she does not use drugs.  Patient lives at home with her significant other.  Most of her family is from the [place] area.  ADVANCE DIRECTIVES:  Not on file  CODE STATUS: Full code  PAST MEDICAL HISTORY: Past Medical History:  Diagnosis Date   Anxiety    Cancer (HCC)    Depression    Heart murmur    Hypertension     PAST SURGICAL HISTORY:  Past Surgical History:  Procedure Laterality Date   BIOPSY  07/26/2023   Procedure: BIOPSY;  Surgeon: Toney Reil, MD;  Location: [facility] ENDOSCOPY;  Service: Gastroenterology;;    CESAREAN SECTION     COLONOSCOPY WITH PROPOFOL N/A 02/21/2023   Procedure: COLONOSCOPY WITH PROPOFOL;  Surgeon: Regis Bill, MD;  Location: [facility] ENDOSCOPY;  Service: Endoscopy;  Laterality: N/A;   DILATION AND CURETTAGE OF UTERUS     FLEXIBLE SIGMOIDOSCOPY N/A 07/26/2023   Procedure: FLEXIBLE SIGMOIDOSCOPY;  Surgeon: Toney Reil, MD;  Location: [facility] ENDOSCOPY;  Service: Gastroenterology;  Laterality: N/A;   IR IMAGING GUIDED PORT INSERTION  02/16/2023   IR PERC CHOLECYSTOSTOMY  09/09/2023    HEMATOLOGY/ONCOLOGY HISTORY:  Oncology History  Signet ring cell adenocarcinoma (HCC)  02/07/2023 Cancer Staging   Staging form: Exocrine Pancreas, AJCC 8th Edition - Clinical stage from 02/07/2023: Stage IV (cTX, cNX, pM1) - Signed by Jeralyn Ruths, MD on 02/14/2023 Stage prefix: Initial diagnosis   02/14/2023 Initial Diagnosis   Signet ring cell adenocarcinoma   03/02/2023 -  Chemotherapy   Patient is on Treatment Plan : GI ORIGIN FOLFOX+KEYTRUDA q21d x12 followed by Gwenlyn Fudge       ALLERGIES:  is allergic to nifedipine.  MEDICATIONS:  Current Facility-Administered Medications  Medication Dose Route Frequency Provider Last Rate Last Admin   0.9 %  sodium chloride infusion   Intravenous PRN Manuela Schwartz, NP 30 mL/hr at 09/10/23 2240 New Bag at 09/10/23 2240   acetaminophen (TYLENOL) tablet 650 mg  650 mg Oral Q6H PRN Gertha Calkin, MD   650 mg at 09/10/23 6213   Or   acetaminophen (TYLENOL) suppository 650 mg  650 mg Rectal Q6H PRN Gertha Calkin, MD       albuterol (PROVENTIL) (2.5 MG/3ML) 0.083% nebulizer solution  2.5 mg  2.5 mg Inhalation Q6H PRN Gertha Calkin, MD       ALPRAZolam Prudy Feeler) tablet 0.25 mg  0.25 mg Oral TID PRN Darlin Priestly, MD   0.25 mg at 09/11/23 1017   Chlorhexidine Gluconate Cloth 2 % PADS 6 each  6 each Topical Daily Darlin Priestly, MD   6 each at 09/11/23 1000   diphenhydrAMINE (BENADRYL) injection 12.5 mg  12.5 mg Intravenous Q6H PRN Arnetha Courser,  MD       Or   diphenhydrAMINE (BENADRYL) 12.5 MG/5ML elixir 12.5 mg  12.5 mg Oral Q6H PRN Arnetha Courser, MD       DULoxetine (CYMBALTA) DR capsule 60 mg  60 mg Oral Daily Gertha Calkin, MD   60 mg at 09/11/23 0927   enoxaparin (LOVENOX) injection 40 mg  40 mg Subcutaneous Q24H Darlin Priestly, MD   40 mg at 09/11/23 4098   gabapentin (NEURONTIN) capsule 600 mg  600 mg Oral TID Gertha Calkin, MD   600 mg at 09/11/23 0926   HYDROmorphone (DILAUDID) 1 mg/mL PCA injection   Intravenous Q4H Arnetha Courser, MD   2.1 mg at 09/11/23 1191   HYDROmorphone (DILAUDID) injection 1 mg  1 mg Intravenous Q2H PRN Manuela Schwartz, NP   1 mg at 09/10/23 0956   iodixanol (VISIPAQUE) 320 MG/ML injection 5 mL  5 mL Other Once PRN El-Abd, Aaron Edelman, MD       naloxone [facility]) injection 0.4 mg  0.4 mg Intravenous PRN Arnetha Courser, MD       And   sodium chloride flush (NS) 0.9 % injection 9 mL  9 mL Intravenous PRN Arnetha Courser, MD       ondansetron (ZOFRAN) 8 mg in sodium chloride 0.9 % 50 mL IVPB  8 mg Intravenous Q8H PRN Gertha Calkin, MD 216 mL/hr at 09/11/23 1023 8 mg at 09/11/23 1023   oxyCODONE-acetaminophen (PERCOCET/ROXICET) 5-325 MG per tablet 1 tablet  1 tablet Oral Q6H PRN Foye Deer, RPH   1 tablet at 09/10/23 4782   And   oxyCODONE (Oxy IR/ROXICODONE) immediate release tablet 5 mg  5 mg Oral Q6H PRN Foye Deer, RPH   5 mg at 09/10/23 0753   pantoprazole (PROTONIX) injection 40 mg  40 mg Intravenous Q12H Irena Cords V, MD   40 mg at 09/11/23 0927   piperacillin-tazobactam (ZOSYN) IVPB 3.375 g  3.375 g Intravenous Q8H Irena Cords V, MD 12.5 mL/hr at 09/11/23 0932 3.375 g at 09/11/23 0932   sodium chloride flush (NS) 0.9 % injection 3 mL  3 mL Intravenous Q12H Gertha Calkin, MD   3 mL at 09/11/23 0933   sodium chloride flush (NS) 0.9 % injection 5 mL  5 mL Intracatheter Q8H Piscoya, Jose, MD   5 mL at 09/11/23 9562   Facility-Administered Medications Ordered in Other Encounters  Medication Dose Route  Frequency Provider Last Rate Last Admin   heparin lock flush 100 unit/mL  500 Units Intravenous Once Donnie Gedeon, Daryl Eastern, NP       sodium chloride flush (NS) 0.9 % injection 10 mL  10 mL Intracatheter PRN Jeralyn Ruths, MD   10 mL at 06/29/23 1430    VITAL SIGNS: BP 123/79 (BP Location: Left Arm)   Pulse (!) 115   Temp 99.1 F (37.3 C) (Oral)   Resp 18   Ht 5\' 6"  (1.676 m)   Wt 129 lb 10.1 oz (58.8 kg)   SpO2 98%  BMI 20.92 kg/m  Filed Weights   09/08/23 1507 09/11/23 0500  Weight: 156 lb (70.8 kg) 129 lb 10.1 oz (58.8 kg)    Estimated body mass index is 20.92 kg/m as calculated from the following:   Height as of this encounter: 5\' 6"  (1.676 m).   Weight as of this encounter: 129 lb 10.1 oz (58.8 kg).  LABS: CBC:    Component Value Date/Time   WBC 4.7 09/11/2023 0600   HGB 7.2 (L) 09/11/2023 0600   HGB 8.1 (L) 09/08/2023 1332   HCT 22.1 (L) 09/11/2023 0600   PLT 113 (L) 09/11/2023 0600   PLT 166 09/08/2023 1332   MCV 92.9 09/11/2023 0600   NEUTROABS 3.6 09/08/2023 1332   LYMPHSABS 0.7 09/08/2023 1332   MONOABS 0.3 09/08/2023 1332   EOSABS 0.0 09/08/2023 1332   BASOSABS 0.0 09/08/2023 1332   Comprehensive Metabolic Panel:    Component Value Date/Time   NA 127 (L) 09/11/2023 0600   K 3.7 09/11/2023 0600   CL 102 09/11/2023 0600   CO2 20 (L) 09/11/2023 0600   BUN 10 09/11/2023 0600   CREATININE 0.49 09/11/2023 0600   CREATININE 0.50 09/08/2023 1332   GLUCOSE 89 09/11/2023 0600   CALCIUM 8.2 (L) 09/11/2023 0600   AST 37 09/11/2023 0600   AST 683 (HH) 09/08/2023 1332   ALT 84 (H) 09/11/2023 0600   ALT 324 (HH) 09/08/2023 1332   ALKPHOS 833 (H) 09/11/2023 0600   BILITOT 0.8 09/11/2023 0600   BILITOT 1.3 (H) 09/08/2023 1332   PROT 6.7 09/11/2023 0600   ALBUMIN 2.7 (L) 09/11/2023 0600    RADIOGRAPHIC STUDIES: IR Perc Cholecystostomy  Result Date: 09/10/2023 INDICATION: Acute cholecystitis EXAM: Placement of percutaneous cholecystostomy tube using  ultrasound and fluoroscopic guidance MEDICATIONS: Documented in the EMR ANESTHESIA/SEDATION: Moderate (conscious) sedation was employed during this procedure. A total of Versed 2 mg and Fentanyl 100 mcg was administered intravenously by the radiology nurse. Total intra-service moderate Sedation Time: 18 minutes. The patient's level of consciousness and vital signs were monitored continuously by radiology nursing throughout the procedure under my direct supervision. FLUOROSCOPY: Radiation Exposure Index (as provided by the fluoroscopic device): 1.5 minutes (8 mGy) COMPLICATIONS: None immediate. PROCEDURE: Informed written consent was obtained from the patient after a thorough discussion of the procedural risks, benefits and alternatives. All questions were addressed. Maximal Sterile Barrier Technique was utilized including caps, mask, sterile gowns, sterile gloves, sterile drape, hand hygiene and skin antiseptic. A timeout was performed prior to the initiation of the procedure. The patient was placed supine on the exam table. The right upper quadrant was prepped and draped in the standard sterile fashion. Ultrasound of the right upper quadrant was performed for planning purposes. This again demonstrated a distended gallbladder with wall edema and sludge, consistent with acute cholecystitis. An infracostal transhepatic approach was planned. Skin entry site was marked, and local analgesia was obtained with 1% lidocaine. Under ultrasound guidance, percutaneous access was obtained into the gallbladder via an infracostal transhepatic approach using a 21 gauge Chiba needle. Access was confirmed with visualization of needle tip within the gallbladder lumen, and free return of bile. An 018 Nitrex wire was then advanced through the access needle and coiled within the gallbladder lumen. A transition dilator was advanced over this wire, through which an antegrade cholecystogram was performed. Antegrade cholecystogram  demonstrated appropriate location in the gallbladder lumen and a distended gallbladder with gallstones. The cystic duct was not visualized. Over an Amplatz wire, the percutaneous tract  was serially dilated followed by placement of a 10 French locking multipurpose drainage catheter into the gallbladder lumen. Locking loop was formed. Additional biliary sludge was drained. Gentle hand injection of contrast material confirmed location of the gallbladder lumen. The drainage catheter was secured to the skin using silk suture and a dressing. It was placed to bag drainage. The patient tolerated the procedure well without immediate complication. IMPRESSION: Successful placement of a 10 French percutaneous cholecystostomy tube. Cholecystostomy tube placed to bag drainage. Additional follow-up recommendations per referring surgical service. Electronically Signed   By: Olive Bass M.D.   On: 09/10/2023 11:02   CT ABDOMEN PELVIS W CONTRAST  Result Date: 09/08/2023 CLINICAL DATA:  Elevated liver enzymes, nausea and diarrhea for 3 days, cholelithiasis with acute cholecystitis on previous imaging, history of metastatic adrenal carcinoma EXAM: CT ABDOMEN AND PELVIS WITH CONTRAST TECHNIQUE: Multidetector CT imaging of the abdomen and pelvis was performed using the standard protocol following bolus administration of intravenous contrast. RADIATION DOSE REDUCTION: This exam was performed according to the departmental dose-optimization program which includes automated exposure control, adjustment of the mA and/or kV according to patient size and/or use of iterative reconstruction technique. CONTRAST:  OMNIPAQUE IOHEXOL 300 MG/ML  SOLN COMPARISON:  08/21/2023, 08/20/2023 FINDINGS: Lower chest: Stable bibasilar hypoventilatory changes and scarring. No acute pleural or parenchymal lung disease. Hepatobiliary: There is a new 1 cm hypodensity within the left lobe liver, corresponding to hypoechoic lesion seen on today's  ultrasound. Given history of metastatic adrenal carcinoma, new metastatic lesion is suspected. Multiple gallstones are again identified, with progressive gallbladder wall thickening and intramural edema consistent with acute cholecystitis. No evidence of choledocholithiasis. Pancreas: Unremarkable. No pancreatic ductal dilatation or surrounding inflammatory changes. Spleen: Normal in size without focal abnormality. Adrenals/Urinary Tract: The adrenals are unremarkable. The kidneys enhance normally. There is new mild left-sided hydronephrosis, with mucosal enhancement at the left UPJ suggesting obstructing mucosal lesion, reference image 43/2. No right-sided obstruction. No urinary tract calculi. The bladder is unremarkable. Stomach/Bowel: No bowel obstruction or ileus. Normal appendix right lower quadrant. No bowel wall thickening or inflammatory change. Vascular/Lymphatic: Stable atherosclerosis of the aorta. There is persistent retroperitoneal lymphadenopathy consistent with metastatic disease. Index left para-aortic lymph node image 44/2 measures 8 mm in short axis, stable. No new adenopathy. Reproductive: 5.1 x 3.9 cm left adnexal mass not appreciably changed since prior exam. Right ovary is unremarkable and stable. Stable uterine fibroid. Other: Trace pelvic free fluid unchanged. No free intraperitoneal gas. No abdominal wall hernia. Musculoskeletal: Diffuse sclerosis throughout the visualized bony structures compatible with bony metastases. No acute or pathologic fracture. Reconstructed images demonstrate no additional findings. IMPRESSION: 1. Cholelithiasis with progressive gallbladder wall thickening and intramural edema consistent with worsening acute cholecystitis. 2. New 1 cm hypodensity within the left lobe liver, concerning for new liver metastasis. This was not evident on recent CT or MRI. 3. Persistent metastatic disease, with diffuse sclerotic bony metastases and retroperitoneal adenopathy unchanged.  4. Stable indeterminate left adnexal mass. 5. New mild left hydronephrosis, with subtle mucosal enhancement within the left ureter at the UPJ. Mucosal lesion cannot be excluded, and urologic consultation may be useful. 6.  Aortic Atherosclerosis (ICD10-I70.0). Electronically Signed   By: Sharlet Salina M.D.   On: 09/08/2023 21:43   US Abdomen Limited RUQ (LIVER/GB)  Result Date: 09/08/2023 CLINICAL DATA:  Quadrant pain for 3 days EXAM: ULTRASOUND ABDOMEN LIMITED RIGHT UPPER QUADRANT COMPARISON:  08/20/2023 FINDINGS: Gallbladder: Multiple shadowing gallstones are seen filling the gallbladder lumen. Gallbladder  wall remains thickened measuring up to 8 mm, with prominent intramural edema now noted. Negative sonographic Murphy sign. Common bile duct: Diameter: 11 mm Liver: Normal liver echotexture. There is a circumscribed 1.3 cm hypoechoic area within the left lobe liver, without associated abnormality on prior CT or MRI. Portal vein is patent on color Doppler imaging with normal direction of blood flow towards the liver. Other: None. IMPRESSION: 1. Cholelithiasis, with worsening gallbladder wall thickening compatible with progressive acute cholecystitis. 2. Stable dilated common bile duct. 3. Indeterminate 1.3 cm hypoechoic area left lobe liver, not clearly seen on previous CT or MRI. Electronically Signed   By: Sharlet Salina M.D.   On: 09/08/2023 20:59   MR ABDOMEN MRCP W WO CONTAST  Result Date: 08/21/2023 CLINICAL DATA:  47 year old female with history of transaminitis and abdominal pain. Gallstones. Evaluate for choledocholithiasis. EXAM: MRI ABDOMEN WITHOUT AND WITH CONTRAST (INCLUDING MRCP) TECHNIQUE: Multiplanar multisequence MR imaging of the abdomen was performed both before and after the administration of intravenous contrast. Heavily T2-weighted images of the biliary and pancreatic ducts were obtained, and three-dimensional MRCP images were rendered by post processing. CONTRAST:  7mL GADAVIST  GADOBUTROL 1 MMOL/ML IV SOLN COMPARISON:  No prior abdominal MRI. CT of the abdomen and pelvis 08/20/2023. FINDINGS: Lower chest: Elevation of the right hemidiaphragm. Multiple prominent borderline enlarged juxta diaphragmatic lymph nodes are noted on the right measuring up to 8 mm in short axis (axial image 21 of series 26). Hepatobiliary: Multiple filling defects are noted within the gallbladder, most notably a 1.9 cm filling defect near the neck of the gallbladder, indicative of gallstones. Gallbladder is severely distended. Gallbladder wall appears thickened and edematous with trace volume of pericholecystic fluid. MRCP images are limited by patient respiratory motion. With this limitation in mind, no definite filling defect within the common bile duct to suggest choledocholithiasis. Common bile duct is dilated measuring up to 11 mm. There is abrupt tapering of the distal common bile duct shortly above the level of the ampulla, suggesting a benign stricture. No intrahepatic biliary ductal dilatation noted at this time. Mild increased T2 signal intensity in a periportal distribution, indicative of periportal edema. No suspicious cystic or solid hepatic lesions. Pancreas: No pancreatic mass. No pancreatic ductal dilatation. No pancreatic or peripancreatic fluid collections or inflammatory changes. Spleen:  Unremarkable. Adrenals/Urinary Tract: Bilateral kidneys and adrenal glands are normal in appearance. No hydroureteronephrosis in the visualized portions of the abdomen. Stomach/Bowel: Visualized portions are unremarkable. Vascular/Lymphatic: Atherosclerosis of the abdominal aorta. No aneurysm identified in the visualized abdominal vasculature. No lymphadenopathy noted in the abdomen. Other: No significant volume of ascites noted in the visualized portions of the peritoneal cavity. Musculoskeletal: Innumerable small areas of heterogeneous enhancement noted throughout the visualized axial skeleton, indicative of  widespread metastatic disease to the bones. Multiple prominent borderline enlarged enhancing lymph nodes in the lower left axial region partially imaged. IMPRESSION: 1. Cholelithiasis with evidence of acute cholecystitis. Surgical consultation is recommended. 2. No choledocholithiasis. 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 This is associated with common bile duct dilatation, but no frank intrahepatic biliary ductal dilatation is noted at this time. 3. Diffuse periportal edema in the liver. 4. Borderline enlarged juxta diaphragmatic lymph nodes and left axillary lymph nodes, likely metastatic. Widespread metastatic disease to the bones redemonstrated. Electronically Signed   By: Trudie Reed M.D.   On: 08/21/2023 07:41   MR 3D Recon At Scanner  Result Date: 08/21/2023 CLINICAL DATA:  47 year old female with history of transaminitis and abdominal pain. Gallstones. Evaluate for choledocholithiasis. EXAM: MRI ABDOMEN WITHOUT AND WITH CONTRAST (INCLUDING MRCP) TECHNIQUE: Multiplanar multisequence MR imaging of the abdomen was performed both before and after the administration of intravenous contrast. Heavily T2-weighted images of the biliary and pancreatic ducts were obtained, and three-dimensional MRCP images were rendered by post processing. CONTRAST:  7mL GADAVIST GADOBUTROL 1 MMOL/ML IV SOLN COMPARISON:  No prior abdominal MRI. CT of the abdomen and pelvis 08/20/2023. FINDINGS: Lower chest: Elevation of the right hemidiaphragm. Multiple prominent borderline enlarged juxta diaphragmatic lymph nodes are noted on the right measuring up to 8 mm in short axis (axial image 21 of series 26). Hepatobiliary: Multiple filling defects are noted within the gallbladder, most notably a 1.9 cm filling defect near the neck of the gallbladder, indicative of gallstones. Gallbladder is severely distended. Gallbladder wall appears thickened and edematous with trace volume of  pericholecystic fluid. MRCP images are limited by patient respiratory motion. With this limitation in mind, no definite filling defect within the common bile duct to suggest choledocholithiasis. Common bile duct is dilated measuring up to 11 mm. There is abrupt tapering of the distal common bile duct shortly above the level of the ampulla, suggesting a benign stricture. No intrahepatic biliary ductal dilatation noted at this time. Mild increased T2 signal intensity in a periportal distribution, indicative of periportal edema. No suspicious cystic or solid hepatic lesions. Pancreas: No pancreatic mass. No pancreatic ductal dilatation. No pancreatic or peripancreatic fluid collections or inflammatory changes. Spleen:  Unremarkable. Adrenals/Urinary Tract: Bilateral kidneys and adrenal glands are normal in appearance. No hydroureteronephrosis in the visualized portions of the abdomen. Stomach/Bowel: Visualized portions are unremarkable. Vascular/Lymphatic: Atherosclerosis of the abdominal aorta. No aneurysm identified in the visualized abdominal vasculature. No lymphadenopathy noted in the abdomen. Other: No significant volume of ascites noted in the visualized portions of the peritoneal cavity. Musculoskeletal: Innumerable small areas of heterogeneous enhancement noted throughout the visualized axial skeleton, indicative of widespread metastatic disease to the bones. Multiple prominent borderline enlarged enhancing lymph nodes in the lower left axial region partially imaged. IMPRESSION: 1. Cholelithiasis with evidence of acute cholecystitis. Surgical consultation is recommended. 2. No choledocholithiasis. 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 This is associated with common bile duct dilatation, but no frank intrahepatic biliary ductal dilatation is noted at this time. 3. Diffuse periportal edema in the liver. 4. Borderline enlarged juxta diaphragmatic lymph nodes and left  axillary lymph nodes, likely metastatic. Widespread metastatic disease to the bones redemonstrated. Electronically Signed   By: Trudie Reed M.D.   On: 08/21/2023 07:41   US Venous Img Lower Unilateral Left  Result Date: 08/21/2023 CLINICAL DATA:  Left lower extremity pain.  Abnormal CT. EXAM: LEFT LOWER EXTREMITY VENOUS DOPPLER ULTRASOUND TECHNIQUE: Gray-scale sonography with compression, as well as color and duplex ultrasound, were performed to evaluate the deep venous system(s) from the level of the common femoral vein through the popliteal and proximal calf veins. COMPARISON:  None Available. FINDINGS: VENOUS Normal compressibility of the common femoral, superficial femoral, and popliteal veins, as well as the visualized calf veins. Visualized portions of profunda femoral vein and great saphenous vein unremarkable. No filling defects to suggest DVT on grayscale or color Doppler imaging. Doppler waveforms show normal direction of venous flow, normal respiratory plasticity and response to augmentation. Limited views of the contralateral common femoral vein are unremarkable. OTHER None. Limitations: none IMPRESSION: No evidence of left lower  extremity DVT. Electronically Signed   By: Narda Rutherford M.D.   On: 08/21/2023 00:08   US ABDOMEN LIMITED RUQ (LIVER/GB)  Result Date: 08/21/2023 CLINICAL DATA:  Abdominal pain. EXAM: ULTRASOUND ABDOMEN LIMITED RIGHT UPPER QUADRANT COMPARISON:  CT earlier today FINDINGS: Gallbladder: Moderately distended containing multiple intraluminal stones and sludge. Edematous gallbladder wall thickening at 5 mm. No sonographic Murphy sign noted by sonographer. Common bile duct: Diameter: 9 mm.  No visualized choledocholithiasis. Liver: There is central intrahepatic biliary ductal dilatation. No focal lesion identified. Within normal limits in parenchymal echogenicity. Portal vein is patent on color Doppler imaging with normal direction of blood flow towards the liver.  Other: No definite pericholecystic fluid by ultrasound. IMPRESSION: 1. Sonographic findings suspicious for acute cholecystitis. Gallstones and sludge with edematous gallbladder wall thickening. 2. Dilated common bile duct with intrahepatic biliary ductal dilatation. No visualized choledocholithiasis. MRCP could be performed for further assessment of the biliary tree as clinically indicated. Electronically Signed   By: Narda Rutherford M.D.   On: 08/21/2023 00:07   CT Angio Chest PE W and/or Wo Contrast  Result Date: 08/20/2023 CLINICAL DATA:  Pulmonary embolus suspected with high probability. Chest pain in the cancer patient. EXAM: CT ANGIOGRAPHY CHEST WITH CONTRAST TECHNIQUE: Multidetector CT imaging of the chest was performed using the standard protocol during bolus administration of intravenous contrast. Multiplanar CT image reconstructions and MIPs were obtained to evaluate the vascular anatomy. RADIATION DOSE REDUCTION: This exam was performed according to the departmental dose-optimization program which includes automated exposure control, adjustment of the mA and/or kV according to patient size and/or use of iterative reconstruction technique. CONTRAST:  OMNIPAQUE IOHEXOL 350 MG/ML SOLN COMPARISON:  CT chest 02/03/2023.  PET-CT 07/06/2023 FINDINGS: Cardiovascular: Technically adequate study with good opacification of the central and segmental pulmonary arteries and mild motion artifact. No focal filling defects are demonstrated. No evidence of significant pulmonary embolus. Mild cardiac enlargement. No pericardial effusions. Normal caliber thoracic aorta. No aortic dissection. 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 Esophagus is decompressed. Thyroid gland is unremarkable. Prominent lymphadenopathy in the left axilla and left supraclavicular region. Left axillary lymph nodes measure up to about 2.3 cm short axis dimension. Lymph nodes are similar in size to  prior PET-CT. No significant mediastinal lymphadenopathy. Lungs/Pleura: Motion artifact limits examination. There is patchy airspace disease throughout both lungs. This could indicate multifocal pneumonia or edema. No pleural effusions. No pneumothorax. Upper Abdomen: Enlarged lymph nodes in the cardiophrenic angle. No acute abnormalities demonstrated in the visualized upper abdomen. 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 Review of the MIP images confirms the above findings. IMPRESSION: 1. No evidence of significant pulmonary embolus. 2. Patchy airspace disease throughout both lungs likely representing edema or multifocal pneumonia. 3. Metastatic lymphadenopathy in the left axilla and supraclavicular region. 4. Diffuse sclerotic skeletal metastasis. Electronically Signed   By: Burman Nieves M.D.   On: 08/20/2023 21:42   CT ABDOMEN PELVIS W CONTRAST  Result Date: 08/20/2023 CLINICAL DATA:  Abdominal pain, acute, nonlocalized. Current cancer patient. Pain in abdomen. EXAM: CT ABDOMEN AND PELVIS WITH CONTRAST TECHNIQUE: Multidetector CT imaging of the abdomen and pelvis was performed using the standard protocol following bolus administration of intravenous contrast. RADIATION DOSE REDUCTION: This exam was performed according to the departmental dose-optimization program which includes automated exposure control, adjustment of the mA and/or kV according to patient size and/or use of iterative reconstruction technique. CONTRAST:  OMNIPAQUE IOHEXOL  350 MG/ML SOLN COMPARISON:  Ultrasound pelvis 07/22/2023, CT abdomen pelvis 07/22/2023, pet ct 07/06/23 FINDINGS: Lower chest: Please see separately dictated CT angiography chest 08/20/2023 Hepatobiliary: No focal liver abnormality. Multiple calcified gallstones within the gallbladder lumen. Question gallbladder wall thickening and pericholecystic fluid. No biliary dilatation.  Pancreas: No focal lesion. Normal pancreatic contour. No surrounding inflammatory changes. No main pancreatic ductal dilatation. Spleen: Normal in size without focal abnormality. Adrenals/Urinary Tract: No adrenal nodule bilaterally. Bilateral kidneys enhance symmetrically. No hydronephrosis. No hydroureter. The urinary bladder is unremarkable. Stomach/Bowel: Stomach is within normal limits. No evidence of bowel wall thickening or dilatation. Appendix appears normal. Vascular/Lymphatic: Asymmetrically hypodense left femoral vein and external iliac vein compared to the right with interval increase in size compared to prior. No abdominal aorta or iliac aneurysm. Severe atherosclerotic plaque of the aorta and its branches. Persistent prominent but nonenlarged retroperitoneal and mesenteric lymph nodes. No abdominal, pelvic, or inguinal lymphadenopathy. Reproductive: Uterine fibroid (8:83). Right adnexa is unremarkable. Similar-appearing heterogeneous hypodense 5.4 x 4.6 cm left adnexal/ovarian mass. Other: Trace volume rectouterine pouch free fluid. No intraperitoneal free gas. No organized fluid collection. Musculoskeletal: No abdominal wall hernia or abnormality. Redemonstrate of diffuse axial and appendicular sclerotic metastases. No acute displaced fracture. Multilevel degenerative changes of the spine. IMPRESSION: 1. Cholelithiasis with question gallbladder wall thickening and pericholecystic fluid. Recommend right upper quadrant ultrasound for further evaluation. 2. Asymmetrically hypodense left femoral vein and external iliac vein compared to the right with interval increase in size compared to prior. Query underlying thrombus. Consider ultrasound if clinically indicated. 3. Similar-appearing heterogeneous hypodense 5.4 x 4.6 cm left adnexal/ovarian mass. 4. Trace volume rectouterine pouch free fluid. 5. Persistent diffuse axial and appendicular sclerotic metastases. 6. Uterine fibroid. 7. Please see separately  dictated CT angiography chest 08/20/2023. Electronically Signed   By: Tish Frederickson M.D.   On: 08/20/2023 21:42   DG Chest 2 View  Result Date: 08/20/2023 CLINICAL DATA:  Chest pain. EXAM: CHEST - 2 VIEW COMPARISON:  07/22/2023. FINDINGS: Bilateral lung fields are clear. Bilateral costophrenic angles are clear. Normal cardio-mediastinal silhouette. No acute osseous abnormalities. Redemonstration of extensive sclerotic metastases throughout the imaged bones. No pathological fracture seen. The soft tissues are within normal limits. Right-sided CT Port-A-Cath is seen with its tip overlying the cavoatrial junction region. IMPRESSION: 1. No active cardiopulmonary disease. 2. Diffuse osseous metastases. Electronically Signed   By: Jules Schick M.D.   On: 08/20/2023 15:27    PERFORMANCE STATUS (ECOG) : 1 - Symptomatic but completely ambulatory  Review of Systems Unless otherwise noted, a complete review of systems is negative.  Physical Exam General: NAD Pulmonary: unlabored Abdomen: soft, chole drain noted GU: no suprapubic tenderness Extremities: no edema, no joint deformities Skin: no rashes Neurological: Weakness but otherwise nonfocal  IMPRESSION: Patient well-known to me from the clinic.  She was admitted with abdominal pain with CT findings suggestive of acute cholecystitis.  Patient recently hospitalized for same and was managed conservatively at that time.  She is now status post cholecystostomy drain by IR on 11/8.  Symptomatically, patient has had poorly controlled pain since placement of her cholecystostomy drain.  Patient tells me that her pain is 9 out of 10 at the site of the drain.  However, patient says that overall her pain has been greatly improved since she was started on hydromorphone PCA.  Discussed pain regimen in detail with patient.  Also discussed with pharmacy, nursing, and hospitalist.  In past 24 hours, patient has received approximately 15  mg of hydromorphone  administered via bolus dosing of PCA.  Will plan to start patient on a transdermal fentanyl patch today at 50 mcg every 72 hours.  This dose reflects approximately 50% reduction in total MME to account for incomplete cross tolerance.  Plan to start weaning hydromorphone PCA (patient is not receiving continuous PCA dose, only bolus dosing).   Also discussed with hospitalist and surgeon possible option of obtaining repeat imaging given ongoing complaint of pain.  At baseline, patient is managed by a pain clinic and is on tramadol ER 300 mg daily and takes Percocet 10-325mg  6 times daily.    CT did suggest progressive disease.  Patient has been off chemotherapy for past 2 months.  Patient is pending medical oncology evaluation tomorrow.  PLAN: -Continue current scope of treatment -Start transdermal fentanyl patch at 50 mcg to 72 hours -Plan to start weaning hydromorphone PCA -Daily bowel regimen -Consider repeat abdominal imaging given ongoing pain  Case and plan discussed with Drs. Fran Lowes and Piscoya  Time Total: 60 minutes  Visit consisted of counseling and education dealing with the complex and emotionally intense issues of symptom management and palliative care in the setting of serious and potentially life-threatening illness.Greater than 50%  of this time was spent counseling and coordinating care related to the above assessment and plan.  Signed by: Laurette Schimke, PhD, NP-C

## 2023-09-12 ENCOUNTER — Inpatient Hospital Stay: Payer: 59

## 2023-09-12 ENCOUNTER — Other Ambulatory Visit: Payer: Self-pay

## 2023-09-12 ENCOUNTER — Other Ambulatory Visit: Payer: Self-pay | Admitting: Oncology

## 2023-09-12 ENCOUNTER — Inpatient Hospital Stay: Payer: 59 | Admitting: Oncology

## 2023-09-12 DIAGNOSIS — Z515 Encounter for palliative care: Secondary | ICD-10-CM | POA: Diagnosis not present

## 2023-09-12 DIAGNOSIS — R1114 Bilious vomiting: Secondary | ICD-10-CM | POA: Diagnosis not present

## 2023-09-12 DIAGNOSIS — C799 Secondary malignant neoplasm of unspecified site: Secondary | ICD-10-CM | POA: Diagnosis not present

## 2023-09-12 LAB — PREPARE RBC (CROSSMATCH)

## 2023-09-12 LAB — COMPREHENSIVE METABOLIC PANEL
ALT: 59 U/L — ABNORMAL HIGH (ref 0–44)
AST: 31 U/L (ref 15–41)
Albumin: 2.4 g/dL — ABNORMAL LOW (ref 3.5–5.0)
Alkaline Phosphatase: 654 U/L — ABNORMAL HIGH (ref 38–126)
Anion gap: 6 (ref 5–15)
BUN: 9 mg/dL (ref 6–20)
CO2: 21 mmol/L — ABNORMAL LOW (ref 22–32)
Calcium: 8.3 mg/dL — ABNORMAL LOW (ref 8.9–10.3)
Chloride: 102 mmol/L (ref 98–111)
Creatinine, Ser: 0.44 mg/dL (ref 0.44–1.00)
GFR, Estimated: >60 mL/min (ref >60)
Glucose, Bld: 94 mg/dL (ref 70–99)
Potassium: 3.8 mmol/L (ref 3.5–5.1)
Sodium: 129 mmol/L — ABNORMAL LOW (ref 135–145)
Total Bilirubin: 0.6 mg/dL (ref <1.2)
Total Protein: 6.2 g/dL — ABNORMAL LOW (ref 6.5–8.1)

## 2023-09-12 LAB — CBC
HCT: 20.9 % — ABNORMAL LOW (ref 36.0–46.0)
Hemoglobin: 6.9 g/dL — ABNORMAL LOW (ref 12.0–15.0)
MCH: 30 pg (ref 26.0–34.0)
MCHC: 33 g/dL (ref 30.0–36.0)
MCV: 90.9 fL (ref 80.0–100.0)
Platelets: 114 10*3/uL — ABNORMAL LOW (ref 150–400)
RBC: 2.3 MIL/uL — ABNORMAL LOW (ref 3.87–5.11)
RDW: 18.6 % — ABNORMAL HIGH (ref 11.5–15.5)
WBC: 4.7 10*3/uL (ref 4.0–10.5)
nRBC: 1.1 % — ABNORMAL HIGH (ref 0.0–0.2)

## 2023-09-12 LAB — MAGNESIUM: Magnesium: 1.8 mg/dL (ref 1.7–2.4)

## 2023-09-12 LAB — HEMOGLOBIN AND HEMATOCRIT, BLOOD
HCT: 25.9 % — ABNORMAL LOW (ref 36.0–46.0)
Hemoglobin: 8.5 g/dL — ABNORMAL LOW (ref 12.0–15.0)

## 2023-09-12 MED ORDER — ENSURE ENLIVE PO LIQD
237.0000 mL | Freq: Three times a day (TID) | ORAL | Status: DC
  Administered 2023-09-12 – 2023-10-03 (×40): 237 mL via ORAL

## 2023-09-12 MED ORDER — POLYETHYLENE GLYCOL 3350 17 G PO PACK
17.0000 g | PACK | Freq: Every day | ORAL | Status: DC
Start: 2023-09-12 — End: 2023-09-13
  Administered 2023-09-12 – 2023-09-13 (×2): 17 g via ORAL
  Filled 2023-09-12 (×2): qty 1

## 2023-09-12 MED ORDER — SODIUM CHLORIDE 0.9% IV SOLUTION: Freq: Once | INTRAVENOUS | Status: AC

## 2023-09-12 MED ORDER — SENNA 8.6 MG PO TABS
1.0000 | ORAL_TABLET | Freq: Every day | ORAL | Status: DC
Start: 2023-09-12 — End: 2023-10-05
  Administered 2023-09-12 – 2023-10-03 (×17): 8.6 mg via ORAL
  Filled 2023-09-12 (×22): qty 1

## 2023-09-12 NOTE — Progress Notes (Signed)
Palliative Medicine [facility] at [facility] [phone] [phone]   Name: Sheryl Silva Date: 09/12/2023 MRN: [id number]  DOB: [birth year]  Patient Care Team: [name], MD as PCP - General (Internal Medicine) Jim Like, RN as Registered Nurse Scarlett Presto, RN (Inactive) as Registered Nurse Benita Gutter, RN as Oncology Nurse Navigator Jeralyn Ruths, MD as Consulting Physician (Oncology) Johnnette Barrios, RN as VBCI Care Management    REASON FOR CONSULTATION: Sheryl Silva is a 47 y.o. 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  Patient recently hospitalized 07/22/2023 to 08/04/2023 with CMV colitis treated with valganciclovir.  She was hospitalized again 08/20/2023 to 08/23/2023 with cholecystitis, which was treated conservatively.  Patient was readmitted 09/08/2023 with abdominal pain and again found to have acute cholecystitis.  Patient underwent cholecystostomy drain placed by IR on 11/8.  She has subsequently had ongoing abdominal pain.  Palliative care was consulted to address goals and manage ongoing symptoms.   CODE STATUS: Full code  PAST MEDICAL HISTORY: Past Medical History:  Diagnosis Date   Anxiety    Cancer (HCC)    Depression    Heart murmur    Hypertension     PAST SURGICAL HISTORY:  Past Surgical History:  Procedure Laterality Date   BIOPSY  07/26/2023   Procedure: BIOPSY;  Surgeon: Toney Reil, MD;  Location: [facility] ENDOSCOPY;  Service: Gastroenterology;;   CESAREAN SECTION     COLONOSCOPY WITH PROPOFOL N/A 02/21/2023   Procedure: COLONOSCOPY WITH PROPOFOL;  Surgeon: Regis Bill, MD;  Location: [facility] ENDOSCOPY;  Service: Endoscopy;  Laterality: N/A;   DILATION AND CURETTAGE OF UTERUS     FLEXIBLE SIGMOIDOSCOPY N/A 07/26/2023   Procedure: FLEXIBLE SIGMOIDOSCOPY;  Surgeon: Toney Reil, MD;  Location:  [facility] ENDOSCOPY;  Service: Gastroenterology;  Laterality: N/A;   IR IMAGING GUIDED PORT INSERTION  02/16/2023   IR PERC CHOLECYSTOSTOMY  09/09/2023    HEMATOLOGY/ONCOLOGY HISTORY:  Oncology History  Signet ring cell adenocarcinoma (HCC)  02/07/2023 Cancer Staging   Staging form: Exocrine Pancreas, AJCC 8th Edition - Clinical stage from 02/07/2023: Stage IV (cTX, cNX, pM1) - Signed by Jeralyn Ruths, MD on 02/14/2023 Stage prefix: Initial diagnosis   02/14/2023 Initial Diagnosis   Signet ring cell adenocarcinoma   03/02/2023 -  Chemotherapy   Patient is on Treatment Plan : GI ORIGIN FOLFOX+KEYTRUDA q21d x12 followed by Gwenlyn Fudge       ALLERGIES:  is allergic to nifedipine.  MEDICATIONS:  Current Facility-Administered Medications  Medication Dose Route Frequency Provider Last Rate Last Admin   0.9 %  sodium chloride infusion   Intravenous PRN Jawo, Modou L, NP 30 mL/hr at 09/12/23 1043 Infusion Verify at 09/12/23 1043   acetaminophen (TYLENOL) tablet 650 mg  650 mg Oral Q6H PRN Gertha Calkin, MD   650 mg at 09/10/23 6213   Or   acetaminophen (TYLENOL) suppository 650 mg  650 mg Rectal Q6H PRN Gertha Calkin, MD       albuterol (PROVENTIL) (2.5 MG/3ML) 0.083% nebulizer solution 2.5 mg  2.5 mg Inhalation Q6H PRN Gertha Calkin, MD       ALPRAZolam Prudy Feeler) tablet 0.25 mg  0.25 mg Oral TID PRN Darlin Priestly, MD   0.25 mg at 09/11/23 2009   Chlorhexidine Gluconate Cloth 2 % PADS 6 each  6 each Topical Daily Darlin Priestly, MD   6 each at 09/12/23 1105  diphenhydrAMINE (BENADRYL) injection 12.5 mg  12.5 mg Intravenous Q6H PRN Arnetha Courser, MD       Or   diphenhydrAMINE (BENADRYL) 12.5 MG/5ML elixir 12.5 mg  12.5 mg Oral Q6H PRN Arnetha Courser, MD       DULoxetine (CYMBALTA) DR capsule 60 mg  60 mg Oral Daily Gertha Calkin, MD   60 mg at 09/12/23 1110   enoxaparin (LOVENOX) injection 40 mg  40 mg Subcutaneous Q24H Darlin Priestly, MD   40 mg at 09/12/23 1021   fentaNYL (DURAGESIC) 50 MCG/HR 1  patch  1 patch Transdermal Q72H Sameerah Nachtigal, Daryl Eastern, NP   1 patch at 09/11/23 1333   gabapentin (NEURONTIN) capsule 600 mg  600 mg Oral TID Gertha Calkin, MD   600 mg at 09/12/23 1110   HYDROmorphone (DILAUDID) 1 mg/mL PCA injection   Intravenous Q4H Arnetha Courser, MD   0.3 mg at 09/12/23 1517   HYDROmorphone (DILAUDID) injection 1 mg  1 mg Intravenous Q2H PRN Manuela Schwartz, NP   1 mg at 09/10/23 0956   iodixanol (VISIPAQUE) 320 MG/ML injection 5 mL  5 mL Other Once PRN El-Abd, Aaron Edelman, MD       naloxone [facility]) injection 0.4 mg  0.4 mg Intravenous PRN Arnetha Courser, MD       And   sodium chloride flush (NS) 0.9 % injection 9 mL  9 mL Intravenous PRN Arnetha Courser, MD       ondansetron (ZOFRAN) 8 mg in sodium chloride 0.9 % 50 mL IVPB  8 mg Intravenous Q8H PRN Gertha Calkin, MD 216 mL/hr at 09/12/23 1104 8 mg at 09/12/23 1104   oxyCODONE-acetaminophen (PERCOCET/ROXICET) 5-325 MG per tablet 1 tablet  1 tablet Oral Q6H PRN Foye Deer, RPH   1 tablet at 09/12/23 1110   And   oxyCODONE (Oxy IR/ROXICODONE) immediate release tablet 5 mg  5 mg Oral Q6H PRN Foye Deer, RPH   5 mg at 09/12/23 1110   pantoprazole (PROTONIX) injection 40 mg  40 mg Intravenous Q12H Gertha Calkin, MD   40 mg at 09/12/23 1012   piperacillin-tazobactam (ZOSYN) IVPB 3.375 g  3.375 g Intravenous Q8H Irena Cords V, MD 12.5 mL/hr at 09/12/23 1043 Infusion Verify at 09/12/23 1043   sodium chloride flush (NS) 0.9 % injection 3 mL  3 mL Intravenous Q12H Irena Cords V, MD   3 mL at 09/12/23 1018   sodium chloride flush (NS) 0.9 % injection 5 mL  5 mL Intracatheter Q8H Piscoya, Jose, MD   5 mL at 09/12/23 1019   Facility-Administered Medications Ordered in Other Encounters  Medication Dose Route Frequency Provider Last Rate Last Admin   heparin lock flush 100 unit/mL  500 Units Intravenous Once Koya Hunger, Daryl Eastern, NP       sodium chloride flush (NS) 0.9 % injection 10 mL  10 mL Intracatheter PRN Jeralyn Ruths, MD   10 mL  at 06/29/23 1430    VITAL SIGNS: BP 137/87 (BP Location: Right Arm)   Pulse (!) 111   Temp 98 F (36.7 C) (Oral)   Resp 20   Ht 5\' 6"  (1.676 m)   Wt 134 lb 11.2 oz (61.1 kg)   SpO2 100%   BMI 21.74 kg/m  Filed Weights   09/08/23 1507 09/11/23 0500 09/12/23 0438  Weight: 156 lb (70.8 kg) 129 lb 10.1 oz (58.8 kg) 134 lb 11.2 oz (61.1 kg)    Estimated body mass index is 21.74  kg/m as calculated from the following:   Height as of this encounter: 5\' 6"  (1.676 m).   Weight as of this encounter: 134 lb 11.2 oz (61.1 kg).  LABS: CBC:    Component Value Date/Time   WBC 4.7 09/12/2023 0440   HGB 6.9 (L) 09/12/2023 0440   HGB 8.1 (L) 09/08/2023 1332   HCT 20.9 (L) 09/12/2023 0440   PLT 114 (L) 09/12/2023 0440   PLT 166 09/08/2023 1332   MCV 90.9 09/12/2023 0440   NEUTROABS 3.6 09/08/2023 1332   LYMPHSABS 0.7 09/08/2023 1332   MONOABS 0.3 09/08/2023 1332   EOSABS 0.0 09/08/2023 1332   BASOSABS 0.0 09/08/2023 1332   Comprehensive Metabolic Panel:    Component Value Date/Time   NA 129 (L) 09/12/2023 0440   K 3.8 09/12/2023 0440   CL 102 09/12/2023 0440   CO2 21 (L) 09/12/2023 0440   BUN 9 09/12/2023 0440   CREATININE 0.44 09/12/2023 0440   CREATININE 0.50 09/08/2023 1332   GLUCOSE 94 09/12/2023 0440   CALCIUM 8.3 (L) 09/12/2023 0440   AST 31 09/12/2023 0440   AST 683 (HH) 09/08/2023 1332   ALT 59 (H) 09/12/2023 0440   ALT 324 (HH) 09/08/2023 1332   ALKPHOS 654 (H) 09/12/2023 0440   BILITOT 0.6 09/12/2023 0440   BILITOT 1.3 (H) 09/08/2023 1332   PROT 6.2 (L) 09/12/2023 0440   ALBUMIN 2.4 (L) 09/12/2023 0440    RADIOGRAPHIC STUDIES: US PELVIC TRANSABD W/PELVIC DOPPLER  Result Date: 09/11/2023 CLINICAL DATA:  Left groin pain. History of stage for metastatic adenocarcinoma of the GI tract. EXAM: TRANSABDOMINAL AND TRANSVAGINAL ULTRASOUND OF PELVIS DOPPLER ULTRASOUND OF OVARIES TECHNIQUE: Both transabdominal and transvaginal ultrasound examinations of the pelvis were  performed. Transabdominal technique was performed for global imaging of the pelvis including uterus, ovaries, adnexal regions, and pelvic cul-de-sac. It was necessary to proceed with endovaginal exam following the transabdominal exam to visualize the uterus and ovaries. Color and duplex Doppler ultrasound was utilized to evaluate blood flow to the ovaries. COMPARISON:  CT AP 09/08/2023 FINDINGS: Uterus Measurements: 8.2 x 4.0 x 5.6 cm = volume: 96 mL. No fibroids or other mass visualized. Endometrium Thickness: 4.1 mm.  No focal abnormality visualized. Right ovary Not visualized. Left ovary Left adnexal mass and left ovary are indistinguishable measuring 5.3 x 5.9 x 5.5 cm. Pulsed Doppler evaluation of both ovaries demonstrates There is decreased color Doppler flow to the left adnexal mass. No arterial waveforms identified within left adnexal mass/left ovary. Normal venous waveforms were noted. Other findings Small volume of free fluid noted within the pelvis. IMPRESSION: 1. Left adnexal mass and left ovary are indistinguishable measuring 5.3 x 5.9 x 5.5 cm. There is decreased color Doppler flow to the left adnexal mass. No arterial waveforms identified within left adnexal mass/left ovary. Normal venous waveforms were noted. Imaging findings are indeterminate but torsion cannot be excluded. 2. Small volume of free fluid noted within the pelvis. 3. Right ovary not visualized. Electronically Signed   By: Signa Kell M.D.   On: 09/11/2023 16:21   IR Perc Cholecystostomy  Result Date: 09/10/2023 INDICATION: Acute cholecystitis EXAM: Placement of percutaneous cholecystostomy tube using ultrasound and fluoroscopic guidance MEDICATIONS: Documented in the EMR ANESTHESIA/SEDATION: Moderate (conscious) sedation was employed during this procedure. A total of Versed 2 mg and Fentanyl 100 mcg was administered intravenously by the radiology nurse. Total intra-service moderate Sedation Time: 18 minutes. The patient's level  of consciousness and vital signs were monitored continuously by radiology  nursing throughout the procedure under my direct supervision. FLUOROSCOPY: Radiation Exposure Index (as provided by the fluoroscopic device): 1.5 minutes (8 mGy) COMPLICATIONS: None immediate. PROCEDURE: Informed written consent was obtained from the patient after a thorough discussion of the procedural risks, benefits and alternatives. All questions were addressed. Maximal Sterile Barrier Technique was utilized including caps, mask, sterile gowns, sterile gloves, sterile drape, hand hygiene and skin antiseptic. A timeout was performed prior to the initiation of the procedure. The patient was placed supine on the exam table. The right upper quadrant was prepped and draped in the standard sterile fashion. Ultrasound of the right upper quadrant was performed for planning purposes. This again demonstrated a distended gallbladder with wall edema and sludge, consistent with acute cholecystitis. An infracostal transhepatic approach was planned. Skin entry site was marked, and local analgesia was obtained with 1% lidocaine. Under ultrasound guidance, percutaneous access was obtained into the gallbladder via an infracostal transhepatic approach using a 21 gauge Chiba needle. Access was confirmed with visualization of needle tip within the gallbladder lumen, and free return of bile. An 018 Nitrex wire was then advanced through the access needle and coiled within the gallbladder lumen. A transition dilator was advanced over this wire, through which an antegrade cholecystogram was performed. Antegrade cholecystogram demonstrated appropriate location in the gallbladder lumen and a distended gallbladder with gallstones. The cystic duct was not visualized. Over an Amplatz wire, the percutaneous tract was serially dilated followed by placement of a 10 French locking multipurpose drainage catheter into the gallbladder lumen. Locking loop was formed.  Additional biliary sludge was drained. Gentle hand injection of contrast material confirmed location of the gallbladder lumen. The drainage catheter was secured to the skin using silk suture and a dressing. It was placed to bag drainage. The patient tolerated the procedure well without immediate complication. IMPRESSION: Successful placement of a 10 French percutaneous cholecystostomy tube. Cholecystostomy tube placed to bag drainage. Additional follow-up recommendations per referring surgical service. Electronically Signed   By: Olive Bass M.D.   On: 09/10/2023 11:02   CT ABDOMEN PELVIS W CONTRAST  Result Date: 09/08/2023 CLINICAL DATA:  Elevated liver enzymes, nausea and diarrhea for 3 days, cholelithiasis with acute cholecystitis on previous imaging, history of metastatic adrenal carcinoma EXAM: CT ABDOMEN AND PELVIS WITH CONTRAST TECHNIQUE: Multidetector CT imaging of the abdomen and pelvis was performed using the standard protocol following bolus administration of intravenous contrast. RADIATION DOSE REDUCTION: This exam was performed according to the departmental dose-optimization program which includes automated exposure control, adjustment of the mA and/or kV according to patient size and/or use of iterative reconstruction technique. CONTRAST:  OMNIPAQUE IOHEXOL 300 MG/ML  SOLN COMPARISON:  08/21/2023, 08/20/2023 FINDINGS: Lower chest: Stable bibasilar hypoventilatory changes and scarring. No acute pleural or parenchymal lung disease. Hepatobiliary: There is a new 1 cm hypodensity within the left lobe liver, corresponding to hypoechoic lesion seen on today's ultrasound. Given history of metastatic adrenal carcinoma, new metastatic lesion is suspected. Multiple gallstones are again identified, with progressive gallbladder wall thickening and intramural edema consistent with acute cholecystitis. No evidence of choledocholithiasis. Pancreas: Unremarkable. No pancreatic ductal dilatation or  surrounding inflammatory changes. Spleen: Normal in size without focal abnormality. Adrenals/Urinary Tract: The adrenals are unremarkable. The kidneys enhance normally. There is new mild left-sided hydronephrosis, with mucosal enhancement at the left UPJ suggesting obstructing mucosal lesion, reference image 43/2. No right-sided obstruction. No urinary tract calculi. The bladder is unremarkable. Stomach/Bowel: No bowel obstruction or ileus. Normal appendix right lower quadrant.  No bowel wall thickening or inflammatory change. Vascular/Lymphatic: Stable atherosclerosis of the aorta. There is persistent retroperitoneal lymphadenopathy consistent with metastatic disease. Index left para-aortic lymph node image 44/2 measures 8 mm in short axis, stable. No new adenopathy. Reproductive: 5.1 x 3.9 cm left adnexal mass not appreciably changed since prior exam. Right ovary is unremarkable and stable. Stable uterine fibroid. Other: Trace pelvic free fluid unchanged. No free intraperitoneal gas. No abdominal wall hernia. Musculoskeletal: Diffuse sclerosis throughout the visualized bony structures compatible with bony metastases. No acute or pathologic fracture. Reconstructed images demonstrate no additional findings. IMPRESSION: 1. Cholelithiasis with progressive gallbladder wall thickening and intramural edema consistent with worsening acute cholecystitis. 2. New 1 cm hypodensity within the left lobe liver, concerning for new liver metastasis. This was not evident on recent CT or MRI. 3. Persistent metastatic disease, with diffuse sclerotic bony metastases and retroperitoneal adenopathy unchanged. 4. Stable indeterminate left adnexal mass. 5. New mild left hydronephrosis, with subtle mucosal enhancement within the left ureter at the UPJ. Mucosal lesion cannot be excluded, and urologic consultation may be useful. 6.  Aortic Atherosclerosis (ICD10-I70.0). Electronically Signed   By: Sharlet Salina M.D.   On: 09/08/2023 21:43    US Abdomen Limited RUQ (LIVER/GB)  Result Date: 09/08/2023 CLINICAL DATA:  Quadrant pain for 3 days EXAM: ULTRASOUND ABDOMEN LIMITED RIGHT UPPER QUADRANT COMPARISON:  08/20/2023 FINDINGS: Gallbladder: Multiple shadowing gallstones are seen filling the gallbladder lumen. Gallbladder wall remains thickened measuring up to 8 mm, with prominent intramural edema now noted. Negative sonographic Murphy sign. Common bile duct: Diameter: 11 mm Liver: Normal liver echotexture. There is a circumscribed 1.3 cm hypoechoic area within the left lobe liver, without associated abnormality on prior CT or MRI. Portal vein is patent on color Doppler imaging with normal direction of blood flow towards the liver. Other: None. IMPRESSION: 1. Cholelithiasis, with worsening gallbladder wall thickening compatible with progressive acute cholecystitis. 2. Stable dilated common bile duct. 3. Indeterminate 1.3 cm hypoechoic area left lobe liver, not clearly seen on previous CT or MRI. Electronically Signed   By: Sharlet Salina M.D.   On: 09/08/2023 20:59   MR ABDOMEN MRCP W WO CONTAST  Result Date: 08/21/2023 CLINICAL DATA:  47 year old female with history of transaminitis and abdominal pain. Gallstones. Evaluate for choledocholithiasis. EXAM: MRI ABDOMEN WITHOUT AND WITH CONTRAST (INCLUDING MRCP) TECHNIQUE: Multiplanar multisequence MR imaging of the abdomen was performed both before and after the administration of intravenous contrast. Heavily T2-weighted images of the biliary and pancreatic ducts were obtained, and three-dimensional MRCP images were rendered by post processing. CONTRAST:  7mL GADAVIST GADOBUTROL 1 MMOL/ML IV SOLN COMPARISON:  No prior abdominal MRI. CT of the abdomen and pelvis 08/20/2023. FINDINGS: Lower chest: Elevation of the right hemidiaphragm. Multiple prominent borderline enlarged juxta diaphragmatic lymph nodes are noted on the right measuring up to 8 mm in short axis (axial image 21 of series 26).  Hepatobiliary: Multiple filling defects are noted within the gallbladder, most notably a 1.9 cm filling defect near the neck of the gallbladder, indicative of gallstones. Gallbladder is severely distended. Gallbladder wall appears thickened and edematous with trace volume of pericholecystic fluid. MRCP images are limited by patient respiratory motion. With this limitation in mind, no definite filling defect within the common bile duct to suggest choledocholithiasis. Common bile duct is dilated measuring up to 11 mm. There is abrupt tapering of the distal common bile duct shortly above the level of the ampulla, suggesting a benign stricture. No intrahepatic biliary ductal dilatation  noted at this time. Mild increased T2 signal intensity in a periportal distribution, indicative of periportal edema. No suspicious cystic or solid hepatic lesions. Pancreas: No pancreatic mass. No pancreatic ductal dilatation. No pancreatic or peripancreatic fluid collections or inflammatory changes. Spleen:  Unremarkable. Adrenals/Urinary Tract: Bilateral kidneys and adrenal glands are normal in appearance. No hydroureteronephrosis in the visualized portions of the abdomen. Stomach/Bowel: Visualized portions are unremarkable. Vascular/Lymphatic: Atherosclerosis of the abdominal aorta. No aneurysm identified in the visualized abdominal vasculature. No lymphadenopathy noted in the abdomen. Other: No significant volume of ascites noted in the visualized portions of the peritoneal cavity. Musculoskeletal: Innumerable small areas of heterogeneous enhancement noted throughout the visualized axial skeleton, indicative of widespread metastatic disease to the bones. Multiple prominent borderline enlarged enhancing lymph nodes in the lower left axial region partially imaged. IMPRESSION: 1. Cholelithiasis with evidence of acute cholecystitis. Surgical consultation is recommended. 2. No choledocholithiasis. There appears to be a benign stricture of  the distal common bile duct, as above. This is associated with common bile duct dilatation, but no frank intrahepatic biliary ductal dilatation is noted at this time. 3. Diffuse periportal edema in the liver. 4. Borderline enlarged juxta diaphragmatic lymph nodes and left axillary lymph nodes, likely metastatic. Widespread metastatic disease to the bones redemonstrated. Electronically Signed   By: Trudie Reed M.D.   On: 08/21/2023 07:41   MR 3D Recon At Scanner  Result Date: 08/21/2023 CLINICAL DATA:  47 year old female with history of transaminitis and abdominal pain. Gallstones. Evaluate for choledocholithiasis. EXAM: MRI ABDOMEN WITHOUT AND WITH CONTRAST (INCLUDING MRCP) TECHNIQUE: Multiplanar multisequence MR imaging of the abdomen was performed both before and after the administration of intravenous contrast. Heavily T2-weighted images of the biliary and pancreatic ducts were obtained, and three-dimensional MRCP images were rendered by post processing. CONTRAST:  7mL GADAVIST GADOBUTROL 1 MMOL/ML IV SOLN COMPARISON:  No prior abdominal MRI. CT of the abdomen and pelvis 08/20/2023. FINDINGS: Lower chest: Elevation of the right hemidiaphragm. Multiple prominent borderline enlarged juxta diaphragmatic lymph nodes are noted on the right measuring up to 8 mm in short axis (axial image 21 of series 26). Hepatobiliary: Multiple filling defects are noted within the gallbladder, most notably a 1.9 cm filling defect near the neck of the gallbladder, indicative of gallstones. Gallbladder is severely distended. Gallbladder wall appears thickened and edematous with trace volume of pericholecystic fluid. MRCP images are limited by patient respiratory motion. With this limitation in mind, no definite filling defect within the common bile duct to suggest choledocholithiasis. Common bile duct is dilated measuring up to 11 mm. There is abrupt tapering of the distal common bile duct shortly above the level of the  ampulla, suggesting a benign stricture. No intrahepatic biliary ductal dilatation noted at this time. Mild increased T2 signal intensity in a periportal distribution, indicative of periportal edema. No suspicious cystic or solid hepatic lesions. Pancreas: No pancreatic mass. No pancreatic ductal dilatation. No pancreatic or peripancreatic fluid collections or inflammatory changes. Spleen:  Unremarkable. Adrenals/Urinary Tract: Bilateral kidneys and adrenal glands are normal in appearance. No hydroureteronephrosis in the visualized portions of the abdomen. Stomach/Bowel: Visualized portions are unremarkable. Vascular/Lymphatic: Atherosclerosis of the abdominal aorta. No aneurysm identified in the visualized abdominal vasculature. No lymphadenopathy noted in the abdomen. Other: No significant volume of ascites noted in the visualized portions of the peritoneal cavity. Musculoskeletal: Innumerable small areas of heterogeneous enhancement noted throughout the visualized axial skeleton, indicative of widespread metastatic disease to the bones. Multiple prominent borderline enlarged enhancing lymph nodes  in the lower left axial region partially imaged. IMPRESSION: 1. Cholelithiasis with evidence of acute cholecystitis. Surgical consultation is recommended. 2. No choledocholithiasis. 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 This is associated with common bile duct dilatation, but no frank intrahepatic biliary ductal dilatation is noted at this time. 3. Diffuse periportal edema in the liver. 4. Borderline enlarged juxta diaphragmatic lymph nodes and left axillary lymph nodes, likely metastatic. Widespread metastatic disease to the bones redemonstrated. Electronically Signed   By: Trudie Reed M.D.   On: 08/21/2023 07:41   US Venous Img Lower Unilateral Left  Result Date: 08/21/2023 CLINICAL DATA:  Left lower extremity pain.  Abnormal CT. EXAM: LEFT LOWER EXTREMITY VENOUS  DOPPLER ULTRASOUND TECHNIQUE: Gray-scale sonography with compression, as well as color and duplex ultrasound, were performed to evaluate the deep venous system(s) from the level of the common femoral vein through the popliteal and proximal calf veins. COMPARISON:  None Available. FINDINGS: VENOUS Normal compressibility of the common femoral, superficial femoral, and popliteal veins, as well as the visualized calf veins. Visualized portions of profunda femoral vein and great saphenous vein unremarkable. No filling defects to suggest DVT on grayscale or color Doppler imaging. Doppler waveforms show normal direction of venous flow, normal respiratory plasticity and response to augmentation. Limited views of the contralateral common femoral vein are unremarkable. OTHER None. Limitations: none IMPRESSION: No evidence of left lower extremity DVT. Electronically Signed   By: Narda Rutherford M.D.   On: 08/21/2023 00:08   US ABDOMEN LIMITED RUQ (LIVER/GB)  Result Date: 08/21/2023 CLINICAL DATA:  Abdominal pain. EXAM: ULTRASOUND ABDOMEN LIMITED RIGHT UPPER QUADRANT COMPARISON:  CT earlier today FINDINGS: Gallbladder: Moderately distended containing multiple intraluminal stones and sludge. Edematous gallbladder wall thickening at 5 mm. No sonographic Murphy sign noted by sonographer. Common bile duct: Diameter: 9 mm.  No visualized choledocholithiasis. Liver: There is central intrahepatic biliary ductal dilatation. No focal lesion identified. Within normal limits in parenchymal echogenicity. Portal vein is patent on color Doppler imaging with normal direction of blood flow towards the liver. Other: No definite pericholecystic fluid by ultrasound. IMPRESSION: 1. Sonographic findings suspicious for acute cholecystitis. Gallstones and sludge with edematous gallbladder wall thickening. 2. Dilated common bile duct with intrahepatic biliary ductal dilatation. No visualized choledocholithiasis. MRCP could be performed for  further assessment of the biliary tree as clinically indicated. Electronically Signed   By: Narda Rutherford M.D.   On: 08/21/2023 00:07   CT Angio Chest PE W and/or Wo Contrast  Result Date: 08/20/2023 CLINICAL DATA:  Pulmonary embolus suspected with high probability. Chest pain in the cancer patient. EXAM: CT ANGIOGRAPHY CHEST WITH CONTRAST TECHNIQUE: Multidetector CT imaging of the chest was performed using the standard protocol during bolus administration of intravenous contrast. Multiplanar CT image reconstructions and MIPs were obtained to evaluate the vascular anatomy. RADIATION DOSE REDUCTION: This exam was performed according to the departmental dose-optimization program which includes automated exposure control, adjustment of the mA and/or kV according to patient size and/or use of iterative reconstruction technique. CONTRAST:  OMNIPAQUE IOHEXOL 350 MG/ML SOLN COMPARISON:  CT chest 02/03/2023.  PET-CT 07/06/2023 FINDINGS: Cardiovascular: Technically adequate study with good opacification of the central and segmental pulmonary arteries and mild motion artifact. No focal filling defects are demonstrated. No evidence of significant pulmonary embolus. Mild cardiac enlargement. No pericardial effusions. Normal caliber thoracic aorta. No aortic dissection. 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 Esophagus is decompressed. Thyroid  gland is unremarkable. Prominent lymphadenopathy in the left axilla and left supraclavicular region. Left axillary lymph nodes measure up to about 2.3 cm short axis dimension. Lymph nodes are similar in size to prior PET-CT. No significant mediastinal lymphadenopathy. Lungs/Pleura: Motion artifact limits examination. There is patchy airspace disease throughout both lungs. This could indicate multifocal pneumonia or edema. No pleural effusions. No pneumothorax. Upper Abdomen: Enlarged lymph nodes in the cardiophrenic angle. No acute  abnormalities demonstrated in the visualized upper abdomen. 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 Review of the MIP images confirms the above findings. IMPRESSION: 1. No evidence of significant pulmonary embolus. 2. Patchy airspace disease throughout both lungs likely representing edema or multifocal pneumonia. 3. Metastatic lymphadenopathy in the left axilla and supraclavicular region. 4. Diffuse sclerotic skeletal metastasis. Electronically Signed   By: Burman Nieves M.D.   On: 08/20/2023 21:42   CT ABDOMEN PELVIS W CONTRAST  Result Date: 08/20/2023 CLINICAL DATA:  Abdominal pain, acute, nonlocalized. Current cancer patient. Pain in abdomen. EXAM: CT ABDOMEN AND PELVIS WITH CONTRAST TECHNIQUE: Multidetector CT imaging of the abdomen and pelvis was performed using the standard protocol following bolus administration of intravenous contrast. RADIATION DOSE REDUCTION: This exam was performed according to the departmental dose-optimization program which includes automated exposure control, adjustment of the mA and/or kV according to patient size and/or use of iterative reconstruction technique. CONTRAST:  OMNIPAQUE IOHEXOL 350 MG/ML SOLN COMPARISON:  Ultrasound pelvis 07/22/2023, CT abdomen pelvis 07/22/2023, pet ct 07/06/23 FINDINGS: Lower chest: Please see separately dictated CT angiography chest 08/20/2023 Hepatobiliary: No focal liver abnormality. Multiple calcified gallstones within the gallbladder lumen. Question gallbladder wall thickening and pericholecystic fluid. No biliary dilatation. Pancreas: No focal lesion. Normal pancreatic contour. No surrounding inflammatory changes. No main pancreatic ductal dilatation. Spleen: Normal in size without focal abnormality. Adrenals/Urinary Tract: No adrenal nodule bilaterally. Bilateral kidneys enhance symmetrically. No hydronephrosis. No hydroureter. The urinary bladder  is unremarkable. Stomach/Bowel: Stomach is within normal limits. No evidence of bowel wall thickening or dilatation. Appendix appears normal. Vascular/Lymphatic: Asymmetrically hypodense left femoral vein and external iliac vein compared to the right with interval increase in size compared to prior. No abdominal aorta or iliac aneurysm. Severe atherosclerotic plaque of the aorta and its branches. Persistent prominent but nonenlarged retroperitoneal and mesenteric lymph nodes. No abdominal, pelvic, or inguinal lymphadenopathy. Reproductive: Uterine fibroid (8:83). Right adnexa is unremarkable. Similar-appearing heterogeneous hypodense 5.4 x 4.6 cm left adnexal/ovarian mass. Other: Trace volume rectouterine pouch free fluid. No intraperitoneal free gas. No organized fluid collection. Musculoskeletal: No abdominal wall hernia or abnormality. Redemonstrate of diffuse axial and appendicular sclerotic metastases. No acute displaced fracture. Multilevel degenerative changes of the spine. IMPRESSION: 1. Cholelithiasis with question gallbladder wall thickening and pericholecystic fluid. Recommend right upper quadrant ultrasound for further evaluation. 2. Asymmetrically hypodense left femoral vein and external iliac vein compared to the right with interval increase in size compared to prior. Query underlying thrombus. Consider ultrasound if clinically indicated. 3. Similar-appearing heterogeneous hypodense 5.4 x 4.6 cm left adnexal/ovarian mass. 4. Trace volume rectouterine pouch free fluid. 5. Persistent diffuse axial and appendicular sclerotic metastases. 6. Uterine fibroid. 7. Please see separately dictated CT angiography chest 08/20/2023. Electronically Signed   By: Tish Frederickson M.D.   On: 08/20/2023 21:42   DG Chest 2 View  Result Date: 08/20/2023 CLINICAL DATA:  Chest pain. EXAM: CHEST - 2 VIEW COMPARISON:  07/22/2023. FINDINGS: Bilateral lung fields are clear. Bilateral costophrenic angles are clear.  Normal  cardio-mediastinal silhouette. No acute osseous abnormalities. Redemonstration of extensive sclerotic metastases throughout the imaged bones. No pathological fracture seen. The soft tissues are within normal limits. Right-sided CT Port-A-Cath is seen with its tip overlying the cavoatrial junction region. IMPRESSION: 1. No active cardiopulmonary disease. 2. Diffuse osseous metastases. Electronically Signed   By: Jules Schick M.D.   On: 08/20/2023 15:27    PERFORMANCE STATUS (ECOG) : 2 - Symptomatic, <50% confined to bed  Review of Systems Unless otherwise noted, a complete review of systems is negative.  Physical Exam General: NAD Cardiovascular: regular rate and rhythm Pulmonary: clear ant fields Abdomen: soft, nontender, + bowel sounds GU: no suprapubic tenderness Extremities: no edema, no joint deformities Skin: no rashes Neurological: Weakness but otherwise nonfocal  IMPRESSION: Follow-up visit.  Patient still endorsing ongoing pain at site of cholecystostomy drain.  Also having pain left groin.  Ultrasound yesterday showed left adnexal mass with decreased Doppler flow.  GYN consult pending.  Discussed with IR feasibility of possible nerve block but this is not an option.  In past 24 hours, patient has received approximately 14.7 mg of IV hydromorphone via PCA.  No significant improvement following initiation of transdermal fentanyl. Plan to start weaning PCA once pain stable.   Daily bowel regimen as patient says she has not had a BM in several days.  PLAN: -Continue current scope of treatment -Continue transdermal fentanyl and consider dose increase if pain remains poorly controlled -Plan to start weaning PCA once pain is stable -Daily bowel regimen  Case and plan discussed with Dr. Orlie Dakin   Time Total: 25 minutes  Visit consisted of counseling and education dealing with the complex and emotionally intense issues of symptom management and palliative care in the setting  of serious and potentially life-threatening illness.Greater than 50%  of this time was spent counseling and coordinating care related to the above assessment and plan.  Signed by: Laurette Schimke, PhD, NP-C

## 2023-09-12 NOTE — Progress Notes (Signed)
Subjective:  CC: Sheryl Silva is a 47 y.o. female  Hospital stay day 3,   acute cholecystitis  HPI: In the no acute changes overnight.  ROS:  General: Denies weight loss, weight gain, fatigue, fevers, chills, and night sweats. Heart: Denies chest pain, palpitations, racing heart, irregular heartbeat, leg pain or swelling, and decreased activity tolerance. Respiratory: Denies breathing difficulty, shortness of breath, wheezing, cough, and sputum. GI: Denies change in appetite, heartburn, nausea, vomiting, constipation, diarrhea, and blood in stool. GU: Denies difficulty urinating, pain with urinating, urgency, frequency, blood in urine.   Objective:   Temp:  [98.3 F (36.8 C)-98.5 F (36.9 C)] 98.4 F (36.9 C) (11/11 0815) Pulse Rate:  [109-113] 110 (11/11 0815) Resp:  [18-21] 21 (11/11 1202) BP: (120-141)/(70-96) 123/70 (11/11 0815) SpO2:  [95 %-100 %] 99 % (11/11 1202) Weight:  [61.1 kg] 61.1 kg (11/11 0438)     Height: 5\' 6"  (167.6 cm) Weight: 61.1 kg BMI (Calculated): 21.75   Intake/Output this shift:   Intake/Output Summary (Last 24 hours) at 09/12/2023 1256 Last data filed at 09/12/2023 1043 Gross per 24 hour  Intake 2904.22 ml  Output 375 ml  Net 2529.22 ml    Constitutional :  alert, cooperative, appears stated age, and no distress  Respiratory:  clear to auscultation bilaterally  Cardiovascular:  regular rate and rhythm  Gastrointestinal: Soft, no guarding, tenderness to palpation around drain site .   Skin: Cool and moist.   Psychiatric: Normal affect, non-agitated, not confused       LABS:     Latest Ref Rng & Units 09/12/2023    4:40 AM 09/11/2023    6:00 AM 09/10/2023    5:50 AM  CMP  Glucose 70 - 99 mg/dL 94  89  80   BUN 6 - 20 mg/dL 9  10  8    Creatinine 0.44 - 1.00 mg/dL 1.61  0.96  0.45   Sodium 135 - 145 mmol/L 129  127  129   Potassium 3.5 - 5.1 mmol/L 3.8  3.7  3.4   Chloride 98 - 111 mmol/L 102  102  100   CO2 22 - 32 mmol/L 21  20  18     Calcium 8.9 - 10.3 mg/dL 8.3  8.2  8.2   Total Protein 6.5 - 8.1 g/dL 6.2  6.7  6.5   Total Bilirubin <1.2 mg/dL 0.6  0.8  0.7   Alkaline Phos 38 - 126 U/L 654  833  1,098   AST 15 - 41 U/L 31  37  74   ALT 0 - 44 U/L 59  84  139       Latest Ref Rng & Units 09/12/2023    4:40 AM 09/11/2023    6:00 AM 09/10/2023    5:50 AM  CBC  WBC 4.0 - 10.5 K/uL 4.7  4.7  4.5   Hemoglobin 12.0 - 15.0 g/dL 6.9  7.2  7.6   Hematocrit 36.0 - 46.0 % 20.9  22.1  23.6   Platelets 150 - 400 K/uL 114  113  125     RADS: N/a Assessment:   Acute cholecystitis status post IR guided cholecystostomy tube placement.  Stable from gallbladder standpoint.  Continue care for other comorbidities per primary team.  She can follow-up with our office in 6 weeks if she is discharged at that time.  Drain care and troubleshooting regarding the drain in the meantime should be addressed with the radiology team.  Surgery to sign off  for now.  Please call with any additional questions or concerns.  labs/images/medications/previous chart entries reviewed personally and relevant changes/updates noted above.

## 2023-09-12 NOTE — Progress Notes (Signed)
Discussed case with Dr. Fran Lowes.  Left adnexal mass which has been present for several months now has absent blood flow on doppler studies and the patient has quite a bit of pain.  General surgery is not willing to take her to the OR due to co-morbidities and overall circumstance. Given that and her complicated oncology history, I spoke to gynecologic oncology and in agreement with them I recommend a gyn onc consultation this week.  I have contacted the Gyn Onc clinic nurse to help arrange this.   Given her advanced-stage cancer, her worst-case is loss of the left ovary (if this is simple torsion).  While this is not ideal for any woman, the risks of this are far smaller than the potential risks of a surgery, given the entire clinical picture.    Thomasene Mohair, MD, [facility] OB[facility]GYN 09/12/2023 3:08 PM

## 2023-09-12 NOTE — Progress Notes (Signed)
PROGRESS NOTE    Sheryl Silva  ZOX:[id number] DOB: 1976-02-24 DOA: 09/08/2023 PCP: [name], MD  113A/113A-AA  LOS: 3 days   Brief hospital course: Sheryl Silva is a 47 y.o. 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   11/9: S/p cholecystostomy drain in place, apparently cultures were not sent, ordered today, hopefully can get some answers as patient was already on antibiotics.  Worsening abdominal pain difficult to control, she was requesting morphine drip, started on Dilaudid PCA. Palliative care was also consulted.  Assessment & Plan:   Acute cholecystitis  Concern of acute cholecystitis with history of stage IV signet cell adenocarcinoma, general surgery was consulted but she is not a good candidate for any surgical intervention. S/p cholecystostomy drain placement by IR on 11/8, draining bile, apparently cultures were not sent so they were ordered next day.  Improving transaminitis. Plan: --cont zosyn --cont drain management.  Persistent pain left groin pain likely due to ovarian mass Complaining of worsening pain was started on PCA dilaudid on 11/9, however, pain does not appear to be from upper abdomen.  possibly due to left adnexal mass that caused torsion and cut off blood supply to ovary.  US pelvic with doppler, which showed known left ovary mass with reduced blood flow. --Onc palliative care consulted, started on Fentanyl  patch Plan: --Gyn consult today --GynOnc to see on Wed --cont Fentanyl patch and wean off PCA dilaudid   Stage IV metastatic signet ring adenocarcinoma: -- Started on Dilaudid PCA due to worsening abdominal pain and requiring a lot of pain medication. 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  Pt states she was on it and was taken off it today.  Pt states she is suppose to take it on Monday.  Anemia: --monitor Hgb, transfuse to keep Hgb >7 --1u pRBC today for Hgb 6.9  Hyponatremia --persistent since Aug 2024 --monitor   DVT prophylaxis: Lovenox SQ Code Status: Full code  Family Communication: brother updated at bedside today Level of care: Med-Surg Dispo:   The patient is from: home Anticipated d/c is to: home Anticipated d/c date is: > 3 days   Subjective and Interval History:  Pt reported pain improved.  Complained of dyspnea but sating 100%.  RN reported poor oral intake.   Objective: Vitals:   09/12/23 1517 09/12/23 1530 09/12/23 1752 09/12/23 1907  BP:  137/87 (!) 147/98   Pulse:  (!) 111 (!) 115   Resp: (!) 22 20 18  (!) 26  Temp:  98 F (36.7 C) 98.8 F (37.1 C)   TempSrc:  Oral    SpO2: 98% 100% 100% 97%  Weight:      Height:        Intake/Output Summary (Last 24 hours) at 09/12/2023 1922 Last data filed at 09/12/2023 1846 Gross per 24 hour  Intake 3356.48 ml  Output 225 ml  Net 3131.48 ml   Filed Weights   09/08/23 1507 09/11/23 0500 09/12/23 0438  Weight:  70.8 kg 58.8 kg 61.1 kg    Examination:   Constitutional: NAD, AAOx3 HEENT: conjunctivae and lids normal, EOMI CV: No cyanosis.   RESP: normal respiratory effort, on RA Neuro: II - XII grossly intact.   Psych: subdued mood and affect.     Data Reviewed: I have personally reviewed labs and imaging studies  Time spent: 50 minutes  Darlin Priestly, MD [facility] If 7PM-7AM, please  contact night-coverage 09/12/2023, 7:22 PM

## 2023-09-12 NOTE — Plan of Care (Signed)

## 2023-09-12 NOTE — Progress Notes (Signed)
OT Cancellation Note  Patient Details Name: Sheryl Silva MRN: [id number] DOB: [birth year]   Cancelled Treatment:    Reason Eval/Treat Not Completed: Pain limiting ability to participate. Chart reviewed - on arrival pt reporting 8/10 pain and pending blood transfusion (Hgb 6.9). Defers mobility attempts this date, requesting next afternoon. Will continue to follow and initiate services as pt medically appropriate to participate in therapy.    Kathie Dike, M.S. OTR/L  09/12/23, 1:58 PM  ascom [phone]

## 2023-09-13 DIAGNOSIS — R079 Chest pain, unspecified: Secondary | ICD-10-CM | POA: Diagnosis not present

## 2023-09-13 DIAGNOSIS — G893 Neoplasm related pain (acute) (chronic): Secondary | ICD-10-CM | POA: Diagnosis not present

## 2023-09-13 LAB — TYPE AND SCREEN
ABO/RH(D): O POS
Antibody Screen: NEGATIVE
Unit division: 0

## 2023-09-13 LAB — CULTURE, BLOOD (ROUTINE X 2)
Culture: NO GROWTH
Culture: NO GROWTH
Special Requests: ADEQUATE
Special Requests: ADEQUATE

## 2023-09-13 LAB — COMPREHENSIVE METABOLIC PANEL
ALT: 43 U/L (ref 0–44)
AST: 30 U/L (ref 15–41)
Albumin: 2.6 g/dL — ABNORMAL LOW (ref 3.5–5.0)
Alkaline Phosphatase: 609 U/L — ABNORMAL HIGH (ref 38–126)
Anion gap: 6 (ref 5–15)
BUN: 8 mg/dL (ref 6–20)
CO2: 22 mmol/L (ref 22–32)
Calcium: 8.1 mg/dL — ABNORMAL LOW (ref 8.9–10.3)
Chloride: 98 mmol/L (ref 98–111)
Creatinine, Ser: 0.47 mg/dL (ref 0.44–1.00)
GFR, Estimated: >60 mL/min (ref >60)
Glucose, Bld: 91 mg/dL (ref 70–99)
Potassium: 3.9 mmol/L (ref 3.5–5.1)
Sodium: 126 mmol/L — ABNORMAL LOW (ref 135–145)
Total Bilirubin: 1 mg/dL (ref <1.2)
Total Protein: 6.4 g/dL — ABNORMAL LOW (ref 6.5–8.1)

## 2023-09-13 LAB — CBC
HCT: 25.6 % — ABNORMAL LOW (ref 36.0–46.0)
Hemoglobin: 8.5 g/dL — ABNORMAL LOW (ref 12.0–15.0)
MCH: 29.7 pg (ref 26.0–34.0)
MCHC: 33.2 g/dL (ref 30.0–36.0)
MCV: 89.5 fL (ref 80.0–100.0)
Platelets: 97 10*3/uL — ABNORMAL LOW (ref 150–400)
RBC: 2.86 MIL/uL — ABNORMAL LOW (ref 3.87–5.11)
RDW: 17.5 % — ABNORMAL HIGH (ref 11.5–15.5)
WBC: 5 10*3/uL (ref 4.0–10.5)
nRBC: 1.4 % — ABNORMAL HIGH (ref 0.0–0.2)

## 2023-09-13 LAB — MAGNESIUM: Magnesium: 1.9 mg/dL (ref 1.7–2.4)

## 2023-09-13 LAB — BPAM RBC
Blood Product Expiration Date: 202412122359
ISSUE DATE / TIME: 202411111502
Unit Type and Rh: 5100

## 2023-09-13 MED ORDER — TRAZODONE HCL 50 MG PO TABS
50.0000 mg | ORAL_TABLET | Freq: Once | ORAL | Status: AC
  Administered 2023-09-13: 50 mg via ORAL
  Filled 2023-09-13: qty 1

## 2023-09-13 MED ORDER — METOPROLOL TARTRATE 25 MG PO TABS
25.0000 mg | ORAL_TABLET | Freq: Two times a day (BID) | ORAL | Status: DC
  Administered 2023-09-13 – 2023-09-30 (×35): 25 mg via ORAL
  Filled 2023-09-13 (×35): qty 1

## 2023-09-13 MED ORDER — POLYETHYLENE GLYCOL 3350 17 G PO PACK
34.0000 g | PACK | ORAL | Status: AC
  Administered 2023-09-13 (×3): 34 g via ORAL
  Filled 2023-09-13 (×2): qty 2

## 2023-09-13 MED ORDER — PROCHLORPERAZINE EDISYLATE 10 MG/2ML IJ SOLN
5.0000 mg | Freq: Four times a day (QID) | INTRAMUSCULAR | Status: DC | PRN
  Administered 2023-09-16 – 2023-09-28 (×2): 5 mg via INTRAVENOUS
  Filled 2023-09-13: qty 1
  Filled 2023-09-13: qty 2
  Filled 2023-09-13: qty 1

## 2023-09-13 MED ORDER — MAGNESIUM HYDROXIDE 400 MG/5ML PO SUSP
30.0000 mL | Freq: Every day | ORAL | Status: DC | PRN
  Administered 2023-09-13: 30 mL via ORAL
  Filled 2023-09-13: qty 30

## 2023-09-13 NOTE — Addendum Note (Signed)
Addended by: Laurette Schimke R on: 09/13/2023 11:10 AM   Modules accepted: Orders

## 2023-09-13 NOTE — TOC Progression Note (Signed)
Transition of Care [facility]) - Progression Note    Patient Details  Name: Sheryl Silva MRN: [id number] Date of Birth: 12/12/1975  Transition of Care [facility]) CM/SW Contact  Allena Katz, LCSW Phone Number: 09/13/2023, 1:59 PM  Clinical Narrative:   Palliative care consulted. Pt requesting morphine drip and was started on a dilaudid PCA.         Expected Discharge Plan and Services                                               Social Determinants of Health (SDOH) Interventions SDOH Screenings   Food Insecurity: No Food Insecurity (09/09/2023)  Recent Concern: Food Insecurity - Food Insecurity Present (07/22/2023)  Housing: Low Risk  (09/09/2023)  Transportation Needs: No Transportation Needs (09/09/2023)  Recent Concern: Transportation Needs - Unmet Transportation Needs (07/22/2023)  Utilities: Not At Risk (09/09/2023)  Alcohol Screen: Low Risk  (02/15/2023)  Depression (PHQ2-9): Low Risk  (02/14/2023)  Financial Resource Strain: Low Risk  (05/09/2023)   Received from [facility], [facility]  Recent Concern: Financial Resource Strain - Medium Risk (02/15/2023)  Physical Activity: Inactive (02/15/2023)  Social Connections: Socially Isolated (02/15/2023)  Stress: Stress Concern Present (02/15/2023)  Tobacco Use: Medium Risk (09/08/2023)    Readmission Risk Interventions    09/10/2023    1:52 PM 08/22/2023   10:03 PM 08/04/2023    8:47 AM  Readmission Risk Prevention Plan  Transportation Screening Complete Complete   PCP or Specialist Appt within 3-5 Days Complete Complete Complete  HRI or Home Care Consult Complete Complete   Social Work Consult for Recovery Care Planning/Counseling Complete --   Palliative Care Screening Not Applicable Not Applicable   Medication Review Oceanographer) Complete Complete

## 2023-09-13 NOTE — Evaluation (Signed)
Occupational Therapy Evaluation Patient Details Name: Sheryl Silva MRN: [id number] DOB: 05-10-1976 Today's Date: 09/13/2023   History of Present Illness Sheryl Silva is a 47 y.o. 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   Clinical Impression   Sheryl Silva was seen for OT evaluation this date. Prior to hospital admission, pt was IND with PRN use of SPC. Pt lives alone in second floor apt. Pt presents to acute OT demonstrating impaired ADL performance and functional mobility 2/2 decreased activity tolerance and functional strength/ROM/balance deficits. Pt currently requires MIN A log rolling bed level, asssit for trunk and cues. CGA + RW for BSC t/f and standing hand washing, cues for hand placement. MOD A for LB access seated EOB. SpO2 89% on 1.5L [place] with activity. Educated on ECS and pain mgmt strategies. Pt would benefit from skilled OT to address noted impairments and functional limitations (see below for any additional details). Upon hospital discharge, recommend OT follow up.    If plan is discharge home, recommend the following: A little help with walking and/or transfers;A little help with bathing/dressing/bathroom;Help with stairs or ramp for entrance;Assistance with cooking/housework    Functional Status Assessment  Patient has had a recent decline in their functional status and demonstrates the ability to make significant improvements in function in a reasonable  and predictable amount of time.  Equipment Recommendations  BSC/3in1    Recommendations for Other Services PT consult     Precautions / Restrictions Precautions Precautions: Fall Restrictions Weight Bearing Restrictions: No      Mobility Bed Mobility Overal bed mobility: Needs Assistance Bed Mobility: Rolling, Sidelying to Sit, Sit to Sidelying Rolling: Contact guard assist Sidelying to sit: Min assist     Sit to sidelying: Contact guard assist General bed mobility comments: cues for log roll    Transfers Overall transfer level: Needs assistance Equipment used: Rolling walker (2 wheels) Transfers: Sit to/from Stand, Bed to chair/wheelchair/BSC Sit to Stand: Contact guard assist     Step pivot transfers: Contact guard assist     General transfer comment: cues for hand placement      Balance Overall balance assessment: Needs assistance Sitting-balance support: No upper extremity supported, Feet supported Sitting balance-Leahy Scale: Good     Standing balance support: No upper extremity supported, During functional activity Standing balance-Leahy Scale: Fair                             ADL either performed or assessed with clinical judgement   ADL Overall ADL's : Needs assistance/impaired                                       General ADL Comments: CGA + RW for BSC t/f and standing hand washing. MOD A for LB access seated EOB.       Pertinent Vitals/Pain Pain Assessment Pain Assessment: 0-10 Pain Score: 5  Pain Location: abdomen Pain Descriptors / Indicators: Grimacing, Dull Pain Intervention(s):  Limited activity within patient's tolerance, Premedicated before session     Extremity/Trunk Assessment Upper Extremity Assessment Upper Extremity Assessment: Overall WFL for tasks assessed   Lower Extremity Assessment Lower Extremity Assessment: Generalized weakness       Communication Communication Communication: No  apparent difficulties   Cognition Arousal: Alert Behavior During Therapy: WFL for tasks assessed/performed Overall Cognitive Status: Within Functional Limits for tasks assessed                                 General Comments: pleasant and agreeable     General Comments  SpO2 89% on 1.5L [place]            Home Living Family/patient expects to be discharged to:: Private residence Living Arrangements: Alone   Type of Home: Apartment Home Access: Stairs to enter [employer] of Steps: flight Entrance Stairs-Rails: Right;Left Home Layout: One level               Home Equipment: Agricultural consultant (2 wheels);Cane - single point          Prior Functioning/Environment Prior Level of Function : Independent/Modified Independent             Mobility Comments: uses SPC when she feels weak          OT Problem List: Decreased strength;Decreased activity tolerance;Impaired balance (sitting and/or standing);Pain      OT Treatment/Interventions: Self-care/ADL training;Therapeutic exercise;Energy conservation;DME and/or AE instruction;Therapeutic activities    OT Goals(Current goals can be found in the care plan section) Acute Rehab OT Goals Patient Stated Goal: to return to PLOF OT Goal Formulation: With patient Time For Goal Achievement: 09/27/23 Potential to Achieve Goals: Good ADL Goals Pt Will Perform Grooming: standing;Independently Pt Will Perform Lower Body Dressing: Independently;sit to/from stand Pt Will Transfer to Toilet: Independently;ambulating;regular height toilet  OT Frequency: Min 1X/week    Co-evaluation              AM-PAC OT "6 Clicks" Daily Activity     Outcome Measure Help from another person eating meals?: None Help from another person taking care of personal grooming?: A Little Help from another person toileting, which includes using toliet, bedpan, or urinal?: A Little Help from another person bathing (including  washing, rinsing, drying)?: A Lot Help from another person to put on and taking off regular upper body clothing?: A Little Help from another person to put on and taking off regular lower body clothing?: A Lot 6 Click Score: 17   End of Session Equipment Utilized During Treatment: Rolling walker (2 wheels)  Activity Tolerance: Patient tolerated treatment well;Patient limited by fatigue Patient left: in bed;with call bell/phone within reach;with bed alarm set  OT Visit Diagnosis: Other abnormalities of gait and mobility (R26.89);Muscle weakness (generalized) (M62.81)                Time: 2130-8657 OT Time Calculation (min): 25 min Charges:  OT General Charges $OT Visit: 1 Visit OT Evaluation $OT Eval Moderate Complexity: 1 Mod OT Treatments $Self Care/Home Management : 8-22 mins  Kathie Dike, M.S. OTR/L  09/13/23, 2:50 PM  ascom [phone]

## 2023-09-13 NOTE — Progress Notes (Signed)
Referring Physician(s): Dr. Tonna Boehringer  Supervising Physician: Marliss Coots  Patient Status:  [facility] - In-pt  Chief Complaint: Acute on chronic calculous cholecystitis s/p percutaneous cholecystostomy 09/09/23  Subjective: Patient resting comfortably in bed. She is an a PCA pump for cancer-related pain.   Allergies: Nifedipine  Medications: Prior to Admission medications   Medication Sig Start Date End Date Taking? Authorizing Provider  albuterol (VENTOLIN HFA) 108 (90 Base) MCG/ACT inhaler Inhale 2 puffs into the lungs every 6 (six) hours as needed for wheezing or shortness of breath.   Yes [provider]  amLODipine (NORVASC) 10 MG tablet Take 10 mg by mouth daily.   Yes [provider]  cyanocobalamin (VITAMIN B12) 500 MCG tablet Take 500 mcg by mouth daily.   Yes [provider]  cyclobenzaprine (FLEXERIL) 10 MG tablet Take 10 mg by mouth at bedtime as needed for muscle spasms.   Yes [provider]  Doxepin HCl 6 MG TABS Take 6 mg by mouth daily as needed.   Yes [provider]  DULoxetine (CYMBALTA) 60 MG capsule Take 60 mg by mouth daily.   Yes [provider]  gabapentin (NEURONTIN) 300 MG capsule Take 2 capsules (600 mg total) by mouth 3 (three) times daily. 08/29/23  Yes Jeralyn Ruths, MD  hydrocortisone 2.5 % cream Apply 1 Application topically 2 (two) times daily as needed (skin irritation).   Yes [provider]  lidocaine-prilocaine (EMLA) cream APPLY TO AFFECTED AREA ONCE AS DIRECTED 08/31/23  Yes Jeralyn Ruths, MD  Multiple Vitamin (MULTIVITAMIN) tablet Take 1 tablet by mouth daily.   Yes [provider]  naloxone (NARCAN) nasal spray 4 mg/0.1 mL Place 1 spray into the nose once. 02/09/23  Yes [provider]  ondansetron (ZOFRAN) 8 MG tablet Take 1 tablet (8 mg total) by mouth every 8 (eight) hours as needed for nausea or vomiting. Start on the third day after chemotherapy. 02/25/23   Yes Jeralyn Ruths, MD  oxyCODONE-acetaminophen (PERCOCET) 10-325 MG tablet Take 1 tablet by mouth every 6 (six) hours as needed for pain.   Yes [provider]  polyethylene glycol (MIRALAX / GLYCOLAX) 17 g packet Take 17 g by mouth daily as needed. 08/04/23  Yes Enedina Finner, MD  prochlorperazine (COMPAZINE) 10 MG tablet Take 1 tablet (10 mg total) by mouth every 6 (six) hours as needed for nausea or vomiting. 02/25/23  Yes Jeralyn Ruths, MD  traMADol (ULTRAM-ER) 300 MG 24 hr tablet Take 300 mg by mouth daily.   Yes [provider]  valGANciclovir (VALCYTE) 450 MG tablet Take 450 mg by mouth 2 (two) times daily.   Yes [provider]  Vitamin D, Ergocalciferol, (DRISDOL) 1.25 MG (50000 UNIT) CAPS capsule Take 50,000 Units by mouth every 7 (seven) days.   Yes [provider]  feeding supplement (ENSURE ENLIVE / ENSURE PLUS) LIQD Take 237 mLs by mouth 3 (three) times daily between meals. 08/04/23   Enedina Finner, MD  megestrol (MEGACE) 40 MG tablet TAKE 1 TABLET BY MOUTH EVERY DAY 09/12/23   Jeralyn Ruths, MD     Vital Signs: BP (!) 142/91 (BP Location: Left Arm)   Pulse (!) 102   Temp 98.7 F (37.1 C)   Resp 16   Ht 5\' 6"  (1.676 m)   Wt 134 lb 11.2 oz (61.1 kg)   SpO2 100%   BMI 21.74 kg/m   Physical Exam Constitutional:      General: She  is not in acute distress. Abdominal:     Tenderness: There is abdominal tenderness.     Comments: RUQ drain to gravity. Approximately 100 ml bile in gravity bag. Drain easily flushed with 5 ml NS. Dressing is clean/dry/intact. Site is tender to palpation.   Skin:    General: Skin is warm and dry.  Neurological:     Mental Status: She is alert and oriented to person, place, and time.     Imaging: US PELVIC TRANSABD W/PELVIC DOPPLER  Result Date: 09/11/2023 CLINICAL DATA:  Left groin pain. History of stage for metastatic adenocarcinoma of the GI tract. EXAM: TRANSABDOMINAL AND TRANSVAGINAL  ULTRASOUND OF PELVIS DOPPLER ULTRASOUND OF OVARIES TECHNIQUE: Both transabdominal and transvaginal ultrasound examinations of the pelvis were performed. Transabdominal technique was performed for global imaging of the pelvis including uterus, ovaries, adnexal regions, and pelvic cul-de-sac. It was necessary to proceed with endovaginal exam following the transabdominal exam to visualize the uterus and ovaries. Color and duplex Doppler ultrasound was utilized to evaluate blood flow to the ovaries. COMPARISON:  CT AP 09/08/2023 FINDINGS: Uterus Measurements: 8.2 x 4.0 x 5.6 cm = volume: 96 mL. No fibroids or other mass visualized. Endometrium Thickness: 4.1 mm.  No focal abnormality visualized. Right ovary Not visualized. Left ovary Left adnexal mass and left ovary are indistinguishable measuring 5.3 x 5.9 x 5.5 cm. Pulsed Doppler evaluation of both ovaries demonstrates There is decreased color Doppler flow to the left adnexal mass. No arterial waveforms identified within left adnexal mass/left ovary. Normal venous waveforms were noted. Other findings Small volume of free fluid noted within the pelvis. IMPRESSION: 1. Left adnexal mass and left ovary are indistinguishable measuring 5.3 x 5.9 x 5.5 cm. There is decreased color Doppler flow to the left adnexal mass. No arterial waveforms identified within left adnexal mass/left ovary. Normal venous waveforms were noted. Imaging findings are indeterminate but torsion cannot be excluded. 2. Small volume of free fluid noted within the pelvis. 3. Right ovary not visualized. Electronically Signed   By: Signa Kell M.D.   On: 09/11/2023 16:21   IR Perc Cholecystostomy  Result Date: 09/10/2023 INDICATION: Acute cholecystitis EXAM: Placement of percutaneous cholecystostomy tube using ultrasound and fluoroscopic guidance MEDICATIONS: Documented in the EMR ANESTHESIA/SEDATION: Moderate (conscious) sedation was employed during this procedure. A total of Versed 2 mg and  Fentanyl 100 mcg was administered intravenously by the radiology nurse. Total intra-service moderate Sedation Time: 18 minutes. The patient's level of consciousness and vital signs were monitored continuously by radiology nursing throughout the procedure under my direct supervision. FLUOROSCOPY: Radiation Exposure Index (as provided by the fluoroscopic device): 1.5 minutes (8 mGy) COMPLICATIONS: None immediate. PROCEDURE: Informed written consent was obtained from the patient after a thorough discussion of the procedural risks, benefits and alternatives. All questions were addressed. Maximal Sterile Barrier Technique was utilized including caps, mask, sterile gowns, sterile gloves, sterile drape, hand hygiene and skin antiseptic. A timeout was performed prior to the initiation of the procedure. The patient was placed supine on the exam table. The right upper quadrant was prepped and draped in the standard sterile fashion. Ultrasound of the right upper quadrant was performed for planning purposes. This again demonstrated a distended gallbladder with wall edema and sludge, consistent with acute cholecystitis. An infracostal transhepatic approach was planned. Skin entry site was marked, and local analgesia was obtained with 1% lidocaine. Under ultrasound guidance, percutaneous access was obtained into the gallbladder via an infracostal transhepatic approach using a 21 gauge Chiba needle.  Access was confirmed with visualization of needle tip within the gallbladder lumen, and free return of bile. An 018 Nitrex wire was then advanced through the access needle and coiled within the gallbladder lumen. A transition dilator was advanced over this wire, through which an antegrade cholecystogram was performed. Antegrade cholecystogram demonstrated appropriate location in the gallbladder lumen and a distended gallbladder with gallstones. The cystic duct was not visualized. Over an Amplatz wire, the percutaneous tract was  serially dilated followed by placement of a 10 French locking multipurpose drainage catheter into the gallbladder lumen. Locking loop was formed. Additional biliary sludge was drained. Gentle hand injection of contrast material confirmed location of the gallbladder lumen. The drainage catheter was secured to the skin using silk suture and a dressing. It was placed to bag drainage. The patient tolerated the procedure well without immediate complication. IMPRESSION: Successful placement of a 10 French percutaneous cholecystostomy tube. Cholecystostomy tube placed to bag drainage. Additional follow-up recommendations per referring surgical service. Electronically Signed   By: Olive Bass M.D.   On: 09/10/2023 11:02    Labs:  CBC: Recent Labs    09/10/23 0550 09/11/23 0600 09/12/23 0440 09/12/23 2019 09/13/23 0437  WBC 4.5 4.7 4.7  --  5.0  HGB 7.6* 7.2* 6.9* 8.5* 8.5*  HCT 23.6* 22.1* 20.9* 25.9* 25.6*  PLT 125* 113* 114*  --  97*    COAGS: Recent Labs    07/22/23 0225 09/08/23 1948  INR 1.1 1.2  APTT 32  --     BMP: Recent Labs    09/10/23 0550 09/11/23 0600 09/12/23 0440 09/13/23 0437  NA 129* 127* 129* 126*  K 3.4* 3.7 3.8 3.9  CL 100 102 102 98  CO2 18* 20* 21* 22  GLUCOSE 80 89 94 91  BUN 8 10 9 8   CALCIUM 8.2* 8.2* 8.3* 8.1*  CREATININE 0.54 0.49 0.44 0.47  GFRNONAA >60 >60 >60 >60    LIVER FUNCTION TESTS: Recent Labs    09/10/23 0550 09/11/23 0600 09/12/23 0440 09/13/23 0437  BILITOT 0.7 0.8 0.6 1.0  AST 74* 37 31 30  ALT 139* 84* 59* 43  ALKPHOS 1,098* 833* 654* 609*  PROT 6.5 6.7 6.2* 6.4*  ALBUMIN 2.8* 2.7* 2.4* 2.6*    Assessment and Plan:  Acute on chronic calculous cholecystitis s/p percutaneous cholecystostomy 09/09/23  She is afebrile and without leukocytosis.   Drain Location: RUQ Size: Fr size: 10 Fr Date of placement: 09/09/23  Currently to: Drain collection device: gravity 24 hour output:  Output by Drain (mL) 09/11/23 0700 -  09/11/23 1459 09/11/23 1500 - 09/11/23 2259 09/11/23 2300 - 09/12/23 0659 09/12/23 0700 - 09/12/23 1459 09/12/23 1500 - 09/12/23 2259 09/12/23 2300 - 09/13/23 0659 09/13/23 0700 - 09/13/23 1432  Biliary Tube Cook slip-coat 10.2 Fr. RUQ  150 125 100  100     Interval imaging/drain manipulation:  None.   Current examination: Flushes/aspirates easily.  Insertion site unremarkable. Suture and stat lock in place. Dressed appropriately.   Plan: Continue TID flushes with 5 cc NS. Record output Q shift. Dressing changes QD or PRN if soiled.  Call IR APP or on call IR MD if difficulty flushing or sudden change in drain output.  Repeat imaging/possible drain injection once output < 10 mL/QD (excluding flush material). Consideration for drain removal if output is < 10 mL/QD (excluding flush material), pending discussion with the providing surgical service.  Discharge planning: Percutaneous cholecystostomy drain to remain in place at least 6 weeks.  Recommend fluoroscopy with injection of the drain in IR to evaluate for patency of the cystic duct. If the duct is patent and general surgery feels patient is stable for cholecystectomy, the drain would be removed at time of surgery. If the duct is patent and general surgery feels patient is NEVER a candidate for cholecystectomy, drain can be capped for a trial. If symptoms recur, then place to gravity bag again. If trial is successful, discuss possible removal of the drain.  IR will continue to follow - please call with questions or concerns.  Electronically Signed: Alwyn Ren, AGACNP-BC [phone] 09/13/2023, 2:32 PM   I spent a total of 15 Minutes at the the patient's bedside AND on the patient's hospital floor or unit, greater than 50% of which was counseling/coordinating care for percutaneous cholecystostomy

## 2023-09-13 NOTE — Progress Notes (Signed)
PROGRESS NOTE    Sheryl Silva  ZOX:[id number] DOB: January 20, 1976 DOA: 09/08/2023 PCP: Sheryl Reichmann, MD  113A/113A-AA  LOS: 4 days   Brief hospital course: Sheryl Silva is a 47 y.o. 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  Also with recent hospitalization for CMV colitis and has been managed on valganciclovir by ID presented to the emergency room with abnormal labs, generalized abdominal pain, nausea, poor appetite.   S/p chole drain placement.  Worsening abdominal pain difficult to control, she was requesting IV pain meds (although she hadn't maximized her home oxycodone), later started on Dilaudid PCA by previous provider.  Palliative care was consulted.  Assessment & Plan:   Acute cholecystitis  Concern of acute cholecystitis with history of stage IV signet cell adenocarcinoma, general surgery was consulted but she is not a good candidate for any surgical intervention. S/p cholecystostomy drain placement by IR on 11/8, draining bile, apparently cultures were not sent so they were ordered next day.  Improving transaminitis. Plan: --cont zosyn --cont drain management.  Persistent pain left groin pain likely due to ovarian mass Complaining of worsening pain was started on PCA dilaudid on 11/9, however, pain does not appear to be from upper abdomen.  possibly due to left adnexal mass that caused torsion and cut off blood supply to ovary.  US pelvic with doppler, which showed known left ovary mass with reduced blood flow. --Onc palliative care consulted, started on Fentanyl patch. --Gyn consulted and rec GynOnc to see pt Plan: --GynOnc to see on  Wed --cont Fentanyl patch and wean off PCA dilaudid, to be managed by Onc palliative care provider   Stage IV metastatic signet ring adenocarcinoma: -- Started on Dilaudid PCA due to worsening abdominal pain and requiring a lot of pain medication. --pain management per palliative care provider   H/O CMV colitis: Pt was on valganciclovir.  Most recent CMV DNA from 09/08/23 was neg.  Pt saw ID Dr. Rivka Safer on 09/08/23, and rec holding ganciclovir while not on chemo.  But if she plans to start chemo she will have to be on suppressive therapy with 900mg  VALGanciclovir every day.  Anemia: --s/p 1u pRBC for Hgb 6.9 --monitor Hgb, transfuse to keep Hgb >7  Hyponatremia --persistent since Aug 2024 --monitor  Constipation --aggressive Miralax   DVT prophylaxis: Lovenox SQ Code Status: Full code  Family Communication: brother updated at bedside today Level of care: Med-Surg Dispo:   The patient is from: home Anticipated d/c is to: home Anticipated d/c date is: > 3 days.  Need to wean off PCA dilaudid   Subjective and Interval History:  Pt reported abdominal pain improved.  Hasn't had BM since presentation.   Objective: Vitals:   09/13/23 1601 09/13/23 1643 09/13/23 1845 09/13/23 1904  BP: (!) 145/94     Pulse: 99     Resp: 16 18  16   Temp: 98.3 F (36.8 C)  98.7 F (37.1 C)   TempSrc:   Oral   SpO2: 100% 100%  93%  Weight:      Height:        Intake/Output Summary (Last 24 hours) at 09/13/2023 2039 Last data filed at 09/13/2023 1756 Gross per 24 hour  Intake 474.11 ml  Output 200 ml  Net  274.11 ml   Filed Weights   09/08/23 1507 09/11/23 0500 09/12/23 0438  Weight: 70.8 kg 58.8 kg 61.1 kg    Examination:   Constitutional: NAD, AAOx3 HEENT: conjunctivae and lids normal, EOMI CV: No cyanosis.   RESP: normal respiratory effort, on RA Neuro: II - XII grossly intact.   Psych: Normal mood and affect.     Data Reviewed: I have personally reviewed labs and  imaging studies  Time spent: 50 minutes  Darlin Priestly, MD [facility] If 7PM-7AM, please contact night-coverage 09/13/2023, 8:39 PM

## 2023-09-14 ENCOUNTER — Inpatient Hospital Stay: Payer: 59

## 2023-09-14 DIAGNOSIS — Z515 Encounter for palliative care: Secondary | ICD-10-CM | POA: Diagnosis not present

## 2023-09-14 DIAGNOSIS — R1900 Intra-abdominal and pelvic swelling, mass and lump, unspecified site: Secondary | ICD-10-CM | POA: Diagnosis not present

## 2023-09-14 DIAGNOSIS — R1084 Generalized abdominal pain: Secondary | ICD-10-CM | POA: Diagnosis not present

## 2023-09-14 DIAGNOSIS — C787 Secondary malignant neoplasm of liver and intrahepatic bile duct: Secondary | ICD-10-CM

## 2023-09-14 DIAGNOSIS — C778 Secondary and unspecified malignant neoplasm of lymph nodes of multiple regions: Secondary | ICD-10-CM

## 2023-09-14 DIAGNOSIS — K810 Acute cholecystitis: Secondary | ICD-10-CM | POA: Diagnosis not present

## 2023-09-14 LAB — COMPREHENSIVE METABOLIC PANEL
ALT: 34 U/L (ref 0–44)
AST: 30 U/L (ref 15–41)
Albumin: 2.4 g/dL — ABNORMAL LOW (ref 3.5–5.0)
Alkaline Phosphatase: 516 U/L — ABNORMAL HIGH (ref 38–126)
Anion gap: 14 (ref 5–15)
BUN: 9 mg/dL (ref 6–20)
CO2: 22 mmol/L (ref 22–32)
Calcium: 8.5 mg/dL — ABNORMAL LOW (ref 8.9–10.3)
Chloride: 93 mmol/L — ABNORMAL LOW (ref 98–111)
Creatinine, Ser: 0.39 mg/dL — ABNORMAL LOW (ref 0.44–1.00)
GFR, Estimated: >60 mL/min (ref >60)
Glucose, Bld: 92 mg/dL (ref 70–99)
Potassium: 4.1 mmol/L (ref 3.5–5.1)
Sodium: 129 mmol/L — ABNORMAL LOW (ref 135–145)
Total Bilirubin: 0.9 mg/dL (ref <1.2)
Total Protein: 6.5 g/dL (ref 6.5–8.1)

## 2023-09-14 LAB — CBC
HCT: 24.9 % — ABNORMAL LOW (ref 36.0–46.0)
Hemoglobin: 8.1 g/dL — ABNORMAL LOW (ref 12.0–15.0)
MCH: 29.7 pg (ref 26.0–34.0)
MCHC: 32.5 g/dL (ref 30.0–36.0)
MCV: 91.2 fL (ref 80.0–100.0)
Platelets: 82 10*3/uL — ABNORMAL LOW (ref 150–400)
RBC: 2.73 MIL/uL — ABNORMAL LOW (ref 3.87–5.11)
RDW: 17.2 % — ABNORMAL HIGH (ref 11.5–15.5)
WBC: 5.8 10*3/uL (ref 4.0–10.5)
nRBC: 0.9 % — ABNORMAL HIGH (ref 0.0–0.2)

## 2023-09-14 LAB — MAGNESIUM: Magnesium: 1.8 mg/dL (ref 1.7–2.4)

## 2023-09-14 LAB — GLUCOSE, CAPILLARY: Glucose-Capillary: 84 mg/dL (ref 70–99)

## 2023-09-14 MED ORDER — OXYCODONE HCL 5 MG PO TABS
10.0000 mg | ORAL_TABLET | ORAL | Status: DC | PRN
  Administered 2023-09-14 – 2023-09-16 (×4): 10 mg via ORAL
  Filled 2023-09-14 (×5): qty 2

## 2023-09-14 MED ORDER — HYDROCORTISONE 1 % EX CREA
TOPICAL_CREAM | Freq: Two times a day (BID) | CUTANEOUS | Status: DC
  Filled 2023-09-14 (×3): qty 28

## 2023-09-14 NOTE — Progress Notes (Signed)
PROGRESS NOTE    Sheryl Silva  QMV:[id number] DOB: 06/21/1976 DOA: 09/08/2023 PCP: [name], MD    Assessment & Plan:   Principal Problem:   Nausea and vomiting Active Problems:   Hypertension   Signet ring cell adenocarcinoma (HCC)   Acute cholecystitis   Transaminitis   Metastatic signet ring cell carcinoma (HCC)  Assessment and Plan: Acute cholecystitis: s/p cholecystostomy drain placed by IR on 09/09/23. Continue on IV zosyn. Apparently cultures were not sent so they were ordered next day.     Left groin pain: likely due to ovarian mass. Continue on PCA dilaudid on 11/9. US pelvic with doppler, which showed known left ovary mass with reduced blood flow. Continue on fentanyl patch. Gyn onc consulted and recs apprec. Palliative care following and recs apprec    Stage IV metastatic signet ring adenocarcinoma: continue w/ pain management. Currently not on treatment. 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  Most recent CMV DNA from 09/08/23 was neg. Saw ID Dr. Rivka Safer on 09/08/23, and rec holding ganciclovir while not on chemo.  But if she plans to start chemo she will have to be on suppressive therapy with 900mg  Valganciclovir every day.   Normocytic anemia: s/p 1 unit of pRBC transfused so far. H&H are trending down   Hyponatremia: labile. Will continue to monitor    Constipation: continue on miralax        DVT prophylaxis: lovenox  Code Status: full  Family Communication: discussed pt's care w/ pt's brother at bedside and answered his questions Disposition Plan: likely d/c to SNF   Level of care: Med-Surg Status is: Inpatient Remains inpatient appropriate because: severity of illness    Consultants:  Gyn onco  Palliative care   Procedures:  Antimicrobials: zosyn    Subjective: Pt c/o pelvic pain   Objective: Vitals:   09/14/23 0316 09/14/23 0348 09/14/23 0450 09/14/23 0703  BP:  132/82    Pulse:  (!)  102    Resp: 18 18  18   Temp:  98.9 F (37.2 C)    TempSrc:      SpO2: 100% 100%  97%  Weight:   61.2 kg   Height:        Intake/Output Summary (Last 24 hours) at 09/14/2023 0752 Last data filed at 09/14/2023 0304 Gross per 24 hour  Intake 438.91 ml  Output 100 ml  Net 338.91 ml   Filed Weights   09/11/23 0500 09/12/23 0438 09/14/23 0450  Weight: 58.8 kg 61.1 kg 61.2 kg    Examination:  General exam: Appears calm and comfortable  Respiratory system: Clear to auscultation. Respiratory effort normal. Cardiovascular system: S1 & S2+. No rubs, gallops or clicks.  Gastrointestinal system: Abdomen is nondistended, soft and tenderness to palpation. Hypoactive bowel sounds heard. Central nervous system: Alert and oriented. Moves all extremities  Psychiatry: Judgement and insight appear normal. Flat mood and affect     Data Reviewed: I have personally reviewed following labs and imaging studies  CBC: Recent Labs  Lab 09/08/23 1332 09/09/23 0735 09/10/23 0550 09/11/23 0600 09/12/23 0440 09/12/23 2019 09/13/23 0437 09/14/23 0452  WBC 4.9   < > 4.5 4.7 4.7  --  5.0 5.8  NEUTROABS 3.6  --   --   --   --   --   --   --   HGB 8.1*   < > 7.6* 7.2* 6.9* 8.5* 8.5* 8.1*  HCT 24.9*   < > 23.6* 22.1*  20.9* 25.9* 25.6* 24.9*  MCV 92.2   < > 93.3 92.9 90.9  --  89.5 91.2  PLT 166   < > 125* 113* 114*  --  97* 82*   < > = values in this interval not displayed.   Basic Metabolic Panel: Recent Labs  Lab 09/10/23 0550 09/11/23 0600 09/12/23 0440 09/13/23 0437 09/14/23 0452  NA 129* 127* 129* 126* 129*  K 3.4* 3.7 3.8 3.9 4.1  CL 100 102 102 98 93*  CO2 18* 20* 21* 22 22  GLUCOSE 80 89 94 91 92  BUN 8 10 9 8 9   CREATININE 0.54 0.49 0.44 0.47 0.39*  CALCIUM 8.2* 8.2* 8.3* 8.1* 8.5*  MG 1.8 1.7 1.8 1.9 1.8   GFR: Estimated Creatinine Clearance: 80.5 mL/min (A) (by C-G formula based on SCr of 0.39 mg/dL (L)). Liver Function Tests: Recent Labs  Lab 09/10/23 0550  09/11/23 0600 09/12/23 0440 09/13/23 0437 09/14/23 0452  AST 74* 37 31 30 30   ALT 139* 84* 59* 43 34  ALKPHOS 1,098* 833* 654* 609* 516*  BILITOT 0.7 0.8 0.6 1.0 0.9  PROT 6.5 6.7 6.2* 6.4* 6.5  ALBUMIN 2.8* 2.7* 2.4* 2.6* 2.4*   Recent Labs  Lab 09/08/23 2117  LIPASE 20   No results for input(s): "AMMONIA" in the last 168 hours. Coagulation Profile: Recent Labs  Lab 09/08/23 1948  INR 1.2   Cardiac Enzymes: No results for input(s): "CKTOTAL", "CKMB", "CKMBINDEX", "TROPONINI" in the last 168 hours. BNP (last 3 results) No results for input(s): "PROBNP" in the last 8760 hours. HbA1C: No results for input(s): "HGBA1C" in the last 72 hours. CBG: Recent Labs  Lab 09/10/23 0821  GLUCAP 72   Lipid Profile: No results for input(s): "CHOL", "HDL", "LDLCALC", "TRIG", "CHOLHDL", "LDLDIRECT" in the last 72 hours. Thyroid Function Tests: No results for input(s): "TSH", "T4TOTAL", "FREET4", "T3FREE", "THYROIDAB" in the last 72 hours. Anemia Panel: No results for input(s): "VITAMINB12", "FOLATE", "FERRITIN", "TIBC", "IRON", "RETICCTPCT" in the last 72 hours. Sepsis Labs: Recent Labs  Lab 09/08/23 1948  LATICACIDVEN 0.7    Recent Results (from the past 240 hour(s))  Blood culture (routine x 2)     Status: None   Collection Time: 09/08/23  7:48 PM   Specimen: BLOOD  Result Value Ref Range Status   Specimen Description BLOOD BLOOD RIGHT ARM  Final   Special Requests   Final    BOTTLES DRAWN AEROBIC AND ANAEROBIC Blood Culture adequate volume   Culture   Final    NO GROWTH 5 DAYS Performed at [facility], [address], [place], [place] [address]    Report Status 09/13/2023 FINAL  Final  Blood culture (routine x 2)     Status: None   Collection Time: 09/08/23  7:48 PM   Specimen: BLOOD  Result Value Ref Range Status   Specimen Description BLOOD BLOOD LEFT HAND  Final   Special Requests   Final    BOTTLES DRAWN AEROBIC AND ANAEROBIC Blood Culture adequate  volume   Culture   Final    NO GROWTH 5 DAYS Performed at [facility], [address] [place], [place] [address]    Report Status 09/13/2023 FINAL  Final         Radiology Studies: No results found.      Scheduled Meds:  Chlorhexidine Gluconate Cloth  6 each Topical Daily   DULoxetine  60 mg Oral Daily   enoxaparin (LOVENOX) injection  40 mg Subcutaneous Q24H  feeding supplement  237 mL Oral TID BM   fentaNYL  1 patch Transdermal Q72H   gabapentin  600 mg Oral TID   HYDROmorphone   Intravenous Q4H   metoprolol tartrate  25 mg Oral BID   pantoprazole (PROTONIX) IV  40 mg Intravenous Q12H   senna  1 tablet Oral Daily   sodium chloride flush  3 mL Intravenous Q12H   sodium chloride flush  5 mL Intracatheter Q8H   Continuous Infusions:  ondansetron (ZOFRAN) IV Stopped (09/12/23 1137)   piperacillin-tazobactam (ZOSYN)  IV 12.5 mL/hr at 09/14/23 0304     LOS: 5 days        Charise Killian, MD [facility] Pager [phone]  If 7PM-7AM, please contact night-coverage [email or web address] Password [facility] 09/14/2023, 7:52 AM

## 2023-09-14 NOTE — Progress Notes (Signed)
Patient established pilot patient. Referral made to [facility] for mental health management by Elouise Munroe, NP.

## 2023-09-14 NOTE — Progress Notes (Signed)
Occupational Therapy Treatment Patient Details Name: Sheryl Silva MRN: [id number] DOB: 1976-06-03 Today's Date: 09/14/2023   History of present illness Sheryl Silva is a 47 y.o. 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   OT comments  Ms Gratton was seen for OT treatment on this date. Upon arrival to room pt in bed, agreeable to limited bed level tx. Pt requires MIN A sup<>long sitting from near flat and scooting higher in bed. Handout provided for theraband exercises, reviewed HEP. Educated pt and family on frequency/use of red theraband and bed level exercises. Pt making progress toward goals, will continue to follow POC. Discharge recommendation remains appropriate.       If plan is discharge home, recommend the following:  A little help with walking and/or transfers;A little help with bathing/dressing/bathroom;Help with stairs or ramp for entrance;Assistance with cooking/housework   Equipment Recommendations  BSC/3in1    Recommendations for Other Services      Precautions / Restrictions Precautions Precautions: Fall Precaution Comments: RUQ drain Restrictions Weight Bearing Restrictions: No       Mobility Bed Mobility Overal bed mobility: Needs Assistance Bed Mobility: Rolling Rolling: Contact guard assist         General bed mobility comments: MIN A sup<>long sitting from near flat and scooting higher in bed    Transfers                    General transfer comment: pt deferred citing fatigue         ADL either performed or assessed with clinical judgement   ADL Overall ADL's : Needs assistance/impaired                                       General ADL Comments: SETUP self-feeding at bed level    Extremity/Trunk Assessment Upper Extremity Assessment Upper Extremity Assessment: Overall WFL for tasks assessed   Lower Extremity Assessment Lower Extremity Assessment: Generalized weakness         Cognition Arousal: Alert Behavior During Therapy: WFL for tasks assessed/performed Overall Cognitive Status: Within Functional Limits for tasks assessed                                                     Pertinent Vitals/ Pain       Pain Assessment Pain Assessment: 0-10 Pain Score: 7  Pain Location: abdomen Pain Descriptors / Indicators: Grimacing, Dull, Sore Pain Intervention(s): Limited activity within patient's tolerance, Repositioned, Premedicated before session  Home Living Family/patient expects to be discharged to:: Private residence Living Arrangements: Alone   Type of Home: Apartment Home Access: Stairs to enter Secretary/administrator of Steps: flight Entrance Stairs-Rails: Right;Left Home Layout: One level     Bathroom Shower/Tub: Tub/shower unit         Home Equipment: Agricultural consultant (2 wheels);Cane - single  point          Prior Functioning/Environment              Frequency  Min 1X/week        Progress Toward Goals  OT Goals(current goals can now be found in the care plan section)  Progress towards OT goals: Progressing toward goals  Acute Rehab OT Goals Patient Stated Goal: to return to PLOF OT Goal Formulation: With patient Time For Goal Achievement: 09/27/23 Potential to Achieve Goals: Good ADL Goals Pt Will Perform Grooming: standing;Independently Pt Will Perform Lower Body Dressing: Independently;sit to/from stand Pt  Will Transfer to Toilet: Independently;ambulating;regular height toilet  Plan      Co-evaluation                 AM-PAC OT "6 Clicks" Daily Activity     Outcome Measure   Help from another person eating meals?: None Help from another person taking care of personal grooming?: A Little Help from another person toileting, which includes using toliet, bedpan, or urinal?: A Little Help from another person bathing (including washing, rinsing, drying)?: A Lot Help from another person to put on and taking off regular upper body clothing?: A Little Help from another person to put on and taking off regular lower body clothing?: A Lot 6 Click Score: 17    End of Session    OT Visit Diagnosis: Other abnormalities of gait and mobility (R26.89);Muscle weakness (generalized) (M62.81)   Activity Tolerance Patient limited by fatigue   Patient Left in bed;with call bell/phone within reach;with family/visitor present   Nurse Communication          Time: 6045-4098 OT Time Calculation (min): 13 min  Charges: OT General Charges $OT Visit: 1 Visit OT Treatments $Therapeutic Exercise: 8-22 mins  Kathie Dike, M.S. OTR/L  09/14/23, 3:13 PM  ascom [phone]

## 2023-09-14 NOTE — Consult Note (Signed)
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 regarding my recommendations.   Sheryl Lormand Leta Jungling, MD

## 2023-09-14 NOTE — Evaluation (Signed)
Physical Therapy Evaluation Patient Details Name: Sheryl Silva MRN: [id number] DOB: 1976-06-29 Today's Date: 09/14/2023  History of Present Illness  Sheryl Silva is a 47 y.o. 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  Clinical Impression  Pt was pleasant and motivated to participate during the session and put forth good effort throughout. Pt is currently Min A at most for bed mobility and transfers. Deferred amb this session 2/2 severe pt fatigue. Pt only needing light assist for perform STS from lowered EOB, showing good ascent up but mild instability in standing. Pt reports mild dizziness that persisted throughout session, though HR and SpO2 were WFL on 2L of O2. Pt reported the dizziness as very mild; still willing to participate. Upon standing pt able to stand for ~15 sec before needing to sit down 2/2 fatigue, pt politely declining ant further mobility due to fatigue and pain, despite PCA pump use before session. Pt will benefit from continued PT services upon discharge to safely address deficits listed in patient problem list for decreased caregiver assistance and eventual return to PLOF.          If plan is discharge home, recommend the following: Help with stairs or ramp for entrance;Assist for transportation;Assistance with cooking/housework;A lot of help with walking and/or transfers;A little help with bathing/dressing/bathroom   Can travel by private  vehicle   No    Equipment Recommendations Other (comment) (TBD at next venue of care)  Recommendations for Other Services       Functional Status Assessment Patient has had a recent decline in their functional status and demonstrates the ability to make significant improvements in function in a reasonable and predictable amount of time.     Precautions / Restrictions Precautions Precautions: Fall Restrictions Weight Bearing Restrictions: No      Mobility  Bed Mobility Overal bed mobility: Needs Assistance Bed Mobility: Rolling, Sidelying to Sit, Sit to Supine Rolling: Contact guard assist Sidelying to sit: Min assist   Sit to supine: +2 for physical assistance, Min assist   General bed mobility comments: cues for log roll; assist to get back in bed +2 for trunk and LE management    Transfers Overall transfer level: Needs assistance Equipment used: Rolling walker (2 wheels) Transfers: Sit to/from Stand Sit to Stand: Min assist, +2 physical assistance           General transfer comment: VC's for hand placement and sequencing    Ambulation/Gait               General Gait Details: declined 2/2 pt exhaustion  Stairs            Wheelchair Mobility     Tilt Bed    Modified Rankin (Stroke Patients Only)       Balance Overall balance assessment: Needs assistance Sitting-balance support: No upper extremity supported, Feet supported Sitting balance-Leahy Scale: Good Sitting balance - Comments: Pt having sensation of dizziness once seated upright.   Standing balance support: During functional activity, Bilateral upper extremity supported Standing balance-Leahy Scale: Fair Standing balance comment: effortful STS, mild instability  with static stance                             Pertinent Vitals/Pain Pain Assessment Pain Assessment: 0-10 Pain Score: 7  Pain Location: abdomen Pain Descriptors / Indicators: Grimacing, Dull, Sore Pain  Intervention(s): Monitored during session, Premedicated before session, Limited activity within patient's tolerance (Via PCA)    Home Living Family/patient expects to be discharged to:: Private residence Living Arrangements: Alone   Type of Home: Apartment Home Access: Stairs to enter Entrance Stairs-Rails: Doctor, general practice of Steps: flight   Home Layout: One level Home Equipment: Agricultural consultant (2 wheels);Cane - single point      Prior Function Prior Level of Function : Independent/Modified Independent             Mobility Comments: Uses SPC at baseline/daily, uses RW when she feels the need to. Pt denies hx of falls, does not drive ADLs Comments: mod independent to independent (depends on day)     Extremity/Trunk Assessment   Upper Extremity Assessment Upper Extremity Assessment: Overall WFL for tasks assessed    Lower Extremity Assessment Lower Extremity Assessment: Generalized weakness       Communication   Communication Communication: No apparent difficulties  Cognition Arousal: Alert Behavior During Therapy: WFL for tasks assessed/performed Overall Cognitive Status: Within Functional Limits for tasks assessed                                          General Comments General comments (skin integrity, edema, etc.): Hard to acheive good pleth 2/2 fingernail polish, but SpO2 reading low 90's throughout session on 2L O2. HR WFL    Exercises     Assessment/Plan    PT Assessment Patient needs continued PT services  PT Problem List Decreased strength;Decreased coordination;Decreased mobility;Decreased balance;Decreased activity tolerance;Decreased range of motion;Pain;Cardiopulmonary status limiting activity       PT Treatment Interventions DME instruction;Balance training;Gait training;Stair training;Functional mobility training;Patient/family education;Therapeutic activities;Therapeutic exercise    PT Goals (Current goals  can be found in the Care Plan section)  Acute Rehab PT Goals Patient Stated Goal: walk w/o pain, get back to performing adl's like cooking/cleaning PT Goal Formulation: With patient Time For Goal Achievement: 09/27/23 Potential to Achieve Goals: Fair    Frequency Min 1X/week     Co-evaluation               AM-PAC PT "6 Clicks" Mobility  Outcome Measure Help needed turning from your back to your side while in a flat bed without using bedrails?: A Little Help needed moving from lying on your back to sitting on the side of a flat bed without using bedrails?: A Little Help needed moving to and from a bed to a chair (including a wheelchair)?: A Little Help needed standing up from a chair using your arms (e.g., wheelchair or bedside chair)?: A Little Help needed to walk in hospital room?: A Little Help needed climbing 3-5 steps with a railing? : A Little 6 Click Score: 18    End of Session Equipment Utilized During Treatment: Oxygen (2L) Activity Tolerance: Patient limited by fatigue;Patient limited by pain Patient left: in bed;with family/visitor present;with bed alarm set;with call bell/phone within reach Nurse Communication: Mobility status PT Visit Diagnosis: Muscle weakness (generalized) (M62.81);Pain;Unsteadiness on feet (R26.81);Difficulty in walking, not elsewhere classified (R26.2) Pain - Right/Left:  (  midline) Pain - part of body:  (abdomen)    Time: 1308-6578 PT Time Calculation (min) (ACUTE ONLY): 23 min   Charges:                 Cecile Sheerer, SPT 09/14/23, 1:37 PM

## 2023-09-14 NOTE — Plan of Care (Signed)

## 2023-09-14 NOTE — Progress Notes (Signed)
Palliative Medicine [facility] at [facility] [phone] Fax:([phone]) [phone]   Name: Sheryl Silva Date: 09/14/2023 MRN: [id number]  DOB: Oct 28, 1976  Patient Care Team: [name], MD as PCP - General (Internal Medicine) Jim Like, RN as Registered Nurse Scarlett Presto, RN (Inactive) as Registered Nurse Benita Gutter, RN as Oncology Nurse Navigator Jeralyn Ruths, MD as Consulting Physician (Oncology) Johnnette Barrios, RN as [facility] Care Management    REASON FOR CONSULTATION: Brittny Sanroman is a 47 y.o. 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  Patient recently hospitalized 07/22/2023 to 08/04/2023 with CMV colitis treated with valganciclovir.  She was hospitalized again 08/20/2023 to 08/23/2023 with cholecystitis, which was treated conservatively.  Patient was readmitted 09/08/2023 with abdominal pain and again found to have acute cholecystitis.  Patient underwent cholecystostomy drain placed by IR on 11/8.  She has subsequently had ongoing abdominal pain.  Palliative care was consulted to address goals and manage ongoing symptoms.   CODE STATUS: Full code  PAST MEDICAL HISTORY: Past Medical History:  Diagnosis Date   Anxiety    Cancer (HCC)    Depression    Heart murmur    Hypertension     PAST SURGICAL HISTORY:  Past Surgical History:  Procedure Laterality Date   BIOPSY  07/26/2023   Procedure: BIOPSY;  Surgeon: Toney Reil, MD;  Location: [facility] ENDOSCOPY;  Service: Gastroenterology;;   CESAREAN SECTION     COLONOSCOPY WITH PROPOFOL N/A 02/21/2023   Procedure: COLONOSCOPY WITH PROPOFOL;  Surgeon: Regis Bill, MD;  Location: [facility] ENDOSCOPY;  Service: Endoscopy;  Laterality: N/A;   DILATION AND CURETTAGE OF UTERUS     FLEXIBLE SIGMOIDOSCOPY N/A 07/26/2023   Procedure: FLEXIBLE SIGMOIDOSCOPY;  Surgeon: Toney Reil, MD;  Location:  [facility] ENDOSCOPY;  Service: Gastroenterology;  Laterality: N/A;   IR IMAGING GUIDED PORT INSERTION  02/16/2023   IR PERC CHOLECYSTOSTOMY  09/09/2023    HEMATOLOGY/ONCOLOGY HISTORY:  Oncology History  Signet ring cell adenocarcinoma (HCC)  02/07/2023 Cancer Staging   Staging form: Exocrine Pancreas, AJCC 8th Edition - Clinical stage from 02/07/2023: Stage IV (cTX, cNX, pM1) - Signed by Jeralyn Ruths, MD on 02/14/2023 Stage prefix: Initial diagnosis   02/14/2023 Initial Diagnosis   Signet ring cell adenocarcinoma   03/02/2023 -  Chemotherapy   Patient is on Treatment Plan : GI ORIGIN FOLFOX+KEYTRUDA q21d x12 followed by Gwenlyn Fudge       ALLERGIES:  is allergic to nifedipine.  MEDICATIONS:  Current Facility-Administered Medications  Medication Dose Route Frequency Provider Last Rate Last Admin   acetaminophen (TYLENOL) tablet 650 mg  650 mg Oral Q6H PRN Gertha Calkin, MD   650 mg at 09/10/23 6213   Or   acetaminophen (TYLENOL) suppository 650 mg  650 mg Rectal Q6H PRN Gertha Calkin, MD       albuterol (PROVENTIL) (2.5 MG/3ML) 0.083% nebulizer solution 2.5 mg  2.5 mg Inhalation Q6H PRN Gertha Calkin, MD       ALPRAZolam Prudy Feeler) tablet 0.25 mg  0.25 mg Oral TID PRN Darlin Priestly, MD   0.25 mg at 09/14/23 1057   Chlorhexidine Gluconate Cloth 2 % PADS 6 each  6 each Topical Daily Darlin Priestly, MD   6 each at 09/14/23 1033   DULoxetine (CYMBALTA) DR capsule 60 mg  60 mg Oral Daily Gertha Calkin, MD   60 mg at 09/14/23 1032   enoxaparin (  LOVENOX) injection 40 mg  40 mg Subcutaneous Q24H Darlin Priestly, MD   40 mg at 09/14/23 1035   feeding supplement (ENSURE ENLIVE / ENSURE PLUS) liquid 237 mL  237 mL Oral TID BM Darlin Priestly, MD   237 mL at 09/14/23 1340   fentaNYL (DURAGESIC) 50 MCG/HR 1 patch  1 patch Transdermal Q72H Desyre Calma, Daryl Eastern, NP   1 patch at 09/14/23 1337   gabapentin (NEURONTIN) capsule 600 mg  600 mg Oral TID Gertha Calkin, MD   600 mg at 09/14/23 1032   hydrocortisone cream 1 %    Topical BID Charise Killian, MD       HYDROmorphone (DILAUDID) injection 1 mg  1 mg Intravenous Q2H PRN Manuela Schwartz, NP   1 mg at 09/10/23 0956   iodixanol (VISIPAQUE) 320 MG/ML injection 5 mL  5 mL Other Once PRN El-Abd, Aaron Edelman, MD       magnesium hydroxide (MILK OF MAGNESIA) suspension 30 mL  30 mL Oral Daily PRN Darlin Priestly, MD   30 mL at 09/13/23 2112   metoprolol tartrate (LOPRESSOR) tablet 25 mg  25 mg Oral BID Darlin Priestly, MD   25 mg at 09/14/23 1032   ondansetron (ZOFRAN) 8 mg in sodium chloride 0.9 % 50 mL IVPB  8 mg Intravenous Q8H PRN Gertha Calkin, MD   Stopped at 09/12/23 1137   oxyCODONE (Oxy IR/ROXICODONE) immediate release tablet 10 mg  10 mg Oral Q4H PRN Nelissa Bolduc, Daryl Eastern, NP       pantoprazole (PROTONIX) injection 40 mg  40 mg Intravenous Q12H Irena Cords V, MD   40 mg at 09/14/23 1032   piperacillin-tazobactam (ZOSYN) IVPB 3.375 g  3.375 g Intravenous Q8H Irena Cords V, MD 21.3 mL/hr at 09/14/23 1031 3.375 g at 09/14/23 1031   prochlorperazine (COMPAZINE) injection 5 mg  5 mg Intravenous Q6H PRN Darlin Priestly, MD       senna Mancel Parsons) tablet 8.6 mg  1 tablet Oral Daily Lankford Gutzmer, Daryl Eastern, NP   8.6 mg at 09/14/23 1032   sodium chloride flush (NS) 0.9 % injection 3 mL  3 mL Intravenous Q12H Irena Cords V, MD   3 mL at 09/14/23 1036   sodium chloride flush (NS) 0.9 % injection 5 mL  5 mL Intracatheter Q8H Piscoya, Jose, MD   5 mL at 09/14/23 1036   Facility-Administered Medications Ordered in Other Encounters  Medication Dose Route Frequency Provider Last Rate Last Admin   heparin lock flush 100 unit/mL  500 Units Intravenous Once Jahid Weida, Daryl Eastern, NP       sodium chloride flush (NS) 0.9 % injection 10 mL  10 mL Intracatheter PRN Jeralyn Ruths, MD   10 mL at 06/29/23 1430    VITAL SIGNS: BP (!) 152/99 (BP Location: Right Arm)   Pulse (!) 106   Temp 98.3 F (36.8 C) (Oral)   Resp 18   Ht 5\' 6"  (1.676 m)   Wt 134 lb 14.7 oz (61.2 kg)   SpO2 100%   BMI 21.78 kg/m   Filed Weights   09/11/23 0500 09/12/23 0438 09/14/23 0450  Weight: 129 lb 10.1 oz (58.8 kg) 134 lb 11.2 oz (61.1 kg) 134 lb 14.7 oz (61.2 kg)    Estimated body mass index is 21.78 kg/m as calculated from the following:   Height as of this encounter: 5\' 6"  (1.676 m).   Weight as of this encounter: 134 lb 14.7 oz (61.2 kg).  LABS: CBC:  Component Value Date/Time   WBC 5.8 09/14/2023 0452   HGB 8.1 (L) 09/14/2023 0452   HGB 8.1 (L) 09/08/2023 1332   HCT 24.9 (L) 09/14/2023 0452   PLT 82 (L) 09/14/2023 0452   PLT 166 09/08/2023 1332   MCV 91.2 09/14/2023 0452   NEUTROABS 3.6 09/08/2023 1332   LYMPHSABS 0.7 09/08/2023 1332   MONOABS 0.3 09/08/2023 1332   EOSABS 0.0 09/08/2023 1332   BASOSABS 0.0 09/08/2023 1332   Comprehensive Metabolic Panel:    Component Value Date/Time   NA 129 (L) 09/14/2023 0452   K 4.1 09/14/2023 0452   CL 93 (L) 09/14/2023 0452   CO2 22 09/14/2023 0452   BUN 9 09/14/2023 0452   CREATININE 0.39 (L) 09/14/2023 0452   CREATININE 0.50 09/08/2023 1332   GLUCOSE 92 09/14/2023 0452   CALCIUM 8.5 (L) 09/14/2023 0452   AST 30 09/14/2023 0452   AST 683 (HH) 09/08/2023 1332   ALT 34 09/14/2023 0452   ALT 324 (HH) 09/08/2023 1332   ALKPHOS 516 (H) 09/14/2023 0452   BILITOT 0.9 09/14/2023 0452   BILITOT 1.3 (H) 09/08/2023 1332   PROT 6.5 09/14/2023 0452   ALBUMIN 2.4 (L) 09/14/2023 0452    RADIOGRAPHIC STUDIES: US PELVIC TRANSABD W/PELVIC DOPPLER  Result Date: 09/11/2023 CLINICAL DATA:  Left groin pain. History of stage for metastatic adenocarcinoma of the GI tract. EXAM: TRANSABDOMINAL AND TRANSVAGINAL ULTRASOUND OF PELVIS DOPPLER ULTRASOUND OF OVARIES TECHNIQUE: Both transabdominal and transvaginal ultrasound examinations of the pelvis were performed. Transabdominal technique was performed for global imaging of the pelvis including uterus, ovaries, adnexal regions, and pelvic cul-de-sac. It was necessary to proceed with endovaginal exam following the  transabdominal exam to visualize the uterus and ovaries. Color and duplex Doppler ultrasound was utilized to evaluate blood flow to the ovaries. COMPARISON:  CT AP 09/08/2023 FINDINGS: Uterus Measurements: 8.2 x 4.0 x 5.6 cm = volume: 96 mL. No fibroids or other mass visualized. Endometrium Thickness: 4.1 mm.  No focal abnormality visualized. Right ovary Not visualized. Left ovary Left adnexal mass and left ovary are indistinguishable measuring 5.3 x 5.9 x 5.5 cm. Pulsed Doppler evaluation of both ovaries demonstrates There is decreased color Doppler flow to the left adnexal mass. No arterial waveforms identified within left adnexal mass/left ovary. Normal venous waveforms were noted. Other findings Small volume of free fluid noted within the pelvis. IMPRESSION: 1. Left adnexal mass and left ovary are indistinguishable measuring 5.3 x 5.9 x 5.5 cm. There is decreased color Doppler flow to the left adnexal mass. No arterial waveforms identified within left adnexal mass/left ovary. Normal venous waveforms were noted. Imaging findings are indeterminate but torsion cannot be excluded. 2. Small volume of free fluid noted within the pelvis. 3. Right ovary not visualized. Electronically Signed   By: Signa Kell M.D.   On: 09/11/2023 16:21   IR Perc Cholecystostomy  Result Date: 09/10/2023 INDICATION: Acute cholecystitis EXAM: Placement of percutaneous cholecystostomy tube using ultrasound and fluoroscopic guidance MEDICATIONS: Documented in the EMR ANESTHESIA/SEDATION: Moderate (conscious) sedation was employed during this procedure. A total of Versed 2 mg and Fentanyl 100 mcg was administered intravenously by the radiology nurse. Total intra-service moderate Sedation Time: 18 minutes. The patient's level of consciousness and vital signs were monitored continuously by radiology nursing throughout the procedure under my direct supervision. FLUOROSCOPY: Radiation Exposure Index (as provided by the fluoroscopic  device): 1.5 minutes (8 mGy) COMPLICATIONS: None immediate. PROCEDURE: Informed written consent was obtained from the patient after a thorough  discussion of the procedural risks, benefits and alternatives. All questions were addressed. Maximal Sterile Barrier Technique was utilized including caps, mask, sterile gowns, sterile gloves, sterile drape, hand hygiene and skin antiseptic. A timeout was performed prior to the initiation of the procedure. The patient was placed supine on the exam table. The right upper quadrant was prepped and draped in the standard sterile fashion. Ultrasound of the right upper quadrant was performed for planning purposes. This again demonstrated a distended gallbladder with wall edema and sludge, consistent with acute cholecystitis. An infracostal transhepatic approach was planned. Skin entry site was marked, and local analgesia was obtained with 1% lidocaine. Under ultrasound guidance, percutaneous access was obtained into the gallbladder via an infracostal transhepatic approach using a 21 gauge Chiba needle. Access was confirmed with visualization of needle tip within the gallbladder lumen, and free return of bile. An 018 Nitrex wire was then advanced through the access needle and coiled within the gallbladder lumen. A transition dilator was advanced over this wire, through which an antegrade cholecystogram was performed. Antegrade cholecystogram demonstrated appropriate location in the gallbladder lumen and a distended gallbladder with gallstones. The cystic duct was not visualized. Over an Amplatz wire, the percutaneous tract was serially dilated followed by placement of a 10 French locking multipurpose drainage catheter into the gallbladder lumen. Locking loop was formed. Additional biliary sludge was drained. Gentle hand injection of contrast material confirmed location of the gallbladder lumen. The drainage catheter was secured to the skin using silk suture and a dressing. It was  placed to bag drainage. The patient tolerated the procedure well without immediate complication. IMPRESSION: Successful placement of a 10 French percutaneous cholecystostomy tube. Cholecystostomy tube placed to bag drainage. Additional follow-up recommendations per referring surgical service. Electronically Signed   By: Olive Bass M.D.   On: 09/10/2023 11:02   CT ABDOMEN PELVIS W CONTRAST  Result Date: 09/08/2023 CLINICAL DATA:  Elevated liver enzymes, nausea and diarrhea for 3 days, cholelithiasis with acute cholecystitis on previous imaging, history of metastatic adrenal carcinoma EXAM: CT ABDOMEN AND PELVIS WITH CONTRAST TECHNIQUE: Multidetector CT imaging of the abdomen and pelvis was performed using the standard protocol following bolus administration of intravenous contrast. RADIATION DOSE REDUCTION: This exam was performed according to the departmental dose-optimization program which includes automated exposure control, adjustment of the mA and/or kV according to patient size and/or use of iterative reconstruction technique. CONTRAST:  OMNIPAQUE IOHEXOL 300 MG/ML  SOLN COMPARISON:  08/21/2023, 08/20/2023 FINDINGS: Lower chest: Stable bibasilar hypoventilatory changes and scarring. No acute pleural or parenchymal lung disease. Hepatobiliary: There is a new 1 cm hypodensity within the left lobe liver, corresponding to hypoechoic lesion seen on today's ultrasound. Given history of metastatic adrenal carcinoma, new metastatic lesion is suspected. Multiple gallstones are again identified, with progressive gallbladder wall thickening and intramural edema consistent with acute cholecystitis. No evidence of choledocholithiasis. Pancreas: Unremarkable. No pancreatic ductal dilatation or surrounding inflammatory changes. Spleen: Normal in size without focal abnormality. Adrenals/Urinary Tract: The adrenals are unremarkable. The kidneys enhance normally. There is new mild left-sided hydronephrosis, with  mucosal enhancement at the left UPJ suggesting obstructing mucosal lesion, reference image 43/2. No right-sided obstruction. No urinary tract calculi. The bladder is unremarkable. Stomach/Bowel: No bowel obstruction or ileus. Normal appendix right lower quadrant. No bowel wall thickening or inflammatory change. Vascular/Lymphatic: Stable atherosclerosis of the aorta. There is persistent retroperitoneal lymphadenopathy consistent with metastatic disease. Index left para-aortic lymph node image 44/2 measures 8 mm in short axis, stable. No  new adenopathy. Reproductive: 5.1 x 3.9 cm left adnexal mass not appreciably changed since prior exam. Right ovary is unremarkable and stable. Stable uterine fibroid. Other: Trace pelvic free fluid unchanged. No free intraperitoneal gas. No abdominal wall hernia. Musculoskeletal: Diffuse sclerosis throughout the visualized bony structures compatible with bony metastases. No acute or pathologic fracture. Reconstructed images demonstrate no additional findings. IMPRESSION: 1. Cholelithiasis with progressive gallbladder wall thickening and intramural edema consistent with worsening acute cholecystitis. 2. New 1 cm hypodensity within the left lobe liver, concerning for new liver metastasis. This was not evident on recent CT or MRI. 3. Persistent metastatic disease, with diffuse sclerotic bony metastases and retroperitoneal adenopathy unchanged. 4. Stable indeterminate left adnexal mass. 5. New mild left hydronephrosis, with subtle mucosal enhancement within the left ureter at the UPJ. Mucosal lesion cannot be excluded, and urologic consultation may be useful. 6.  Aortic Atherosclerosis (ICD10-I70.0). Electronically Signed   By: Sharlet Salina M.D.   On: 09/08/2023 21:43   US Abdomen Limited RUQ (LIVER/GB)  Result Date: 09/08/2023 CLINICAL DATA:  Quadrant pain for 3 days EXAM: ULTRASOUND ABDOMEN LIMITED RIGHT UPPER QUADRANT COMPARISON:  08/20/2023 FINDINGS: Gallbladder: Multiple  shadowing gallstones are seen filling the gallbladder lumen. Gallbladder wall remains thickened measuring up to 8 mm, with prominent intramural edema now noted. Negative sonographic Murphy sign. Common bile duct: Diameter: 11 mm Liver: Normal liver echotexture. There is a circumscribed 1.3 cm hypoechoic area within the left lobe liver, without associated abnormality on prior CT or MRI. Portal vein is patent on color Doppler imaging with normal direction of blood flow towards the liver. Other: None. IMPRESSION: 1. Cholelithiasis, with worsening gallbladder wall thickening compatible with progressive acute cholecystitis. 2. Stable dilated common bile duct. 3. Indeterminate 1.3 cm hypoechoic area left lobe liver, not clearly seen on previous CT or MRI. Electronically Signed   By: Sharlet Salina M.D.   On: 09/08/2023 20:59   MR ABDOMEN MRCP W WO CONTAST  Result Date: 08/21/2023 CLINICAL DATA:  47 year old female with history of transaminitis and abdominal pain. Gallstones. Evaluate for choledocholithiasis. EXAM: MRI ABDOMEN WITHOUT AND WITH CONTRAST (INCLUDING MRCP) TECHNIQUE: Multiplanar multisequence MR imaging of the abdomen was performed both before and after the administration of intravenous contrast. Heavily T2-weighted images of the biliary and pancreatic ducts were obtained, and three-dimensional MRCP images were rendered by post processing. CONTRAST:  7mL GADAVIST GADOBUTROL 1 MMOL/ML IV SOLN COMPARISON:  No prior abdominal MRI. CT of the abdomen and pelvis 08/20/2023. FINDINGS: Lower chest: Elevation of the right hemidiaphragm. Multiple prominent borderline enlarged juxta diaphragmatic lymph nodes are noted on the right measuring up to 8 mm in short axis (axial image 21 of series 26). Hepatobiliary: Multiple filling defects are noted within the gallbladder, most notably a 1.9 cm filling defect near the neck of the gallbladder, indicative of gallstones. Gallbladder is severely distended. Gallbladder wall  appears thickened and edematous with trace volume of pericholecystic fluid. MRCP images are limited by patient respiratory motion. With this limitation in mind, no definite filling defect within the common bile duct to suggest choledocholithiasis. Common bile duct is dilated measuring up to 11 mm. There is abrupt tapering of the distal common bile duct shortly above the level of the ampulla, suggesting a benign stricture. No intrahepatic biliary ductal dilatation noted at this time. Mild increased T2 signal intensity in a periportal distribution, indicative of periportal edema. No suspicious cystic or solid hepatic lesions. Pancreas: No pancreatic mass. No pancreatic ductal dilatation. No pancreatic or peripancreatic fluid  collections or inflammatory changes. Spleen:  Unremarkable. Adrenals/Urinary Tract: Bilateral kidneys and adrenal glands are normal in appearance. No hydroureteronephrosis in the visualized portions of the abdomen. Stomach/Bowel: Visualized portions are unremarkable. Vascular/Lymphatic: Atherosclerosis of the abdominal aorta. No aneurysm identified in the visualized abdominal vasculature. No lymphadenopathy noted in the abdomen. Other: No significant volume of ascites noted in the visualized portions of the peritoneal cavity. Musculoskeletal: Innumerable small areas of heterogeneous enhancement noted throughout the visualized axial skeleton, indicative of widespread metastatic disease to the bones. Multiple prominent borderline enlarged enhancing lymph nodes in the lower left axial region partially imaged. IMPRESSION: 1. Cholelithiasis with evidence of acute cholecystitis. Surgical consultation is recommended. 2. No choledocholithiasis. 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 This is associated with common bile duct dilatation, but no frank intrahepatic biliary ductal dilatation is noted at this time. 3. Diffuse periportal edema in the liver. 4. Borderline  enlarged juxta diaphragmatic lymph nodes and left axillary lymph nodes, likely metastatic. Widespread metastatic disease to the bones redemonstrated. Electronically Signed   By: Trudie Reed M.D.   On: 08/21/2023 07:41   MR 3D Recon At Scanner  Result Date: 08/21/2023 CLINICAL DATA:  47 year old female with history of transaminitis and abdominal pain. Gallstones. Evaluate for choledocholithiasis. EXAM: MRI ABDOMEN WITHOUT AND WITH CONTRAST (INCLUDING MRCP) TECHNIQUE: Multiplanar multisequence MR imaging of the abdomen was performed both before and after the administration of intravenous contrast. Heavily T2-weighted images of the biliary and pancreatic ducts were obtained, and three-dimensional MRCP images were rendered by post processing. CONTRAST:  7mL GADAVIST GADOBUTROL 1 MMOL/ML IV SOLN COMPARISON:  No prior abdominal MRI. CT of the abdomen and pelvis 08/20/2023. FINDINGS: Lower chest: Elevation of the right hemidiaphragm. Multiple prominent borderline enlarged juxta diaphragmatic lymph nodes are noted on the right measuring up to 8 mm in short axis (axial image 21 of series 26). Hepatobiliary: Multiple filling defects are noted within the gallbladder, most notably a 1.9 cm filling defect near the neck of the gallbladder, indicative of gallstones. Gallbladder is severely distended. Gallbladder wall appears thickened and edematous with trace volume of pericholecystic fluid. MRCP images are limited by patient respiratory motion. With this limitation in mind, no definite filling defect within the common bile duct to suggest choledocholithiasis. Common bile duct is dilated measuring up to 11 mm. There is abrupt tapering of the distal common bile duct shortly above the level of the ampulla, suggesting a benign stricture. No intrahepatic biliary ductal dilatation noted at this time. Mild increased T2 signal intensity in a periportal distribution, indicative of periportal edema. No suspicious cystic or solid  hepatic lesions. Pancreas: No pancreatic mass. No pancreatic ductal dilatation. No pancreatic or peripancreatic fluid collections or inflammatory changes. Spleen:  Unremarkable. Adrenals/Urinary Tract: Bilateral kidneys and adrenal glands are normal in appearance. No hydroureteronephrosis in the visualized portions of the abdomen. Stomach/Bowel: Visualized portions are unremarkable. Vascular/Lymphatic: Atherosclerosis of the abdominal aorta. No aneurysm identified in the visualized abdominal vasculature. No lymphadenopathy noted in the abdomen. Other: No significant volume of ascites noted in the visualized portions of the peritoneal cavity. Musculoskeletal: Innumerable small areas of heterogeneous enhancement noted throughout the visualized axial skeleton, indicative of widespread metastatic disease to the bones. Multiple prominent borderline enlarged enhancing lymph nodes in the lower left axial region partially imaged. IMPRESSION: 1. Cholelithiasis with evidence of acute cholecystitis. Surgical consultation is recommended. 2. No choledocholithiasis. 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 This is associated with common bile duct dilatation, but no frank intrahepatic biliary ductal dilatation is noted at this time. 3. Diffuse periportal edema in the liver. 4. Borderline enlarged juxta diaphragmatic lymph nodes and left axillary lymph nodes, likely metastatic. Widespread metastatic disease to the bones redemonstrated. Electronically Signed   By: Trudie Reed M.D.   On: 08/21/2023 07:41   US Venous Img Lower Unilateral Left  Result Date: 08/21/2023 CLINICAL DATA:  Left lower extremity pain.  Abnormal CT. EXAM: LEFT LOWER EXTREMITY VENOUS DOPPLER ULTRASOUND TECHNIQUE: Gray-scale sonography with compression, as well as color and duplex ultrasound, were performed to evaluate the deep venous system(s) from the level of the common femoral vein through the popliteal and  proximal calf veins. COMPARISON:  None Available. FINDINGS: VENOUS Normal compressibility of the common femoral, superficial femoral, and popliteal veins, as well as the visualized calf veins. Visualized portions of profunda femoral vein and great saphenous vein unremarkable. No filling defects to suggest DVT on grayscale or color Doppler imaging. Doppler waveforms show normal direction of venous flow, normal respiratory plasticity and response to augmentation. Limited views of the contralateral common femoral vein are unremarkable. OTHER None. Limitations: none IMPRESSION: No evidence of left lower extremity DVT. Electronically Signed   By: Narda Rutherford M.D.   On: 08/21/2023 00:08   US ABDOMEN LIMITED RUQ (LIVER/GB)  Result Date: 08/21/2023 CLINICAL DATA:  Abdominal pain. EXAM: ULTRASOUND ABDOMEN LIMITED RIGHT UPPER QUADRANT COMPARISON:  CT earlier today FINDINGS: Gallbladder: Moderately distended containing multiple intraluminal stones and sludge. Edematous gallbladder wall thickening at 5 mm. No sonographic Murphy sign noted by sonographer. Common bile duct: Diameter: 9 mm.  No visualized choledocholithiasis. Liver: There is central intrahepatic biliary ductal dilatation. No focal lesion identified. Within normal limits in parenchymal echogenicity. Portal vein is patent on color Doppler imaging with normal direction of blood flow towards the liver. Other: No definite pericholecystic fluid by ultrasound. IMPRESSION: 1. Sonographic findings suspicious for acute cholecystitis. Gallstones and sludge with edematous gallbladder wall thickening. 2. Dilated common bile duct with intrahepatic biliary ductal dilatation. No visualized choledocholithiasis. MRCP could be performed for further assessment of the biliary tree as clinically indicated. Electronically Signed   By: Narda Rutherford M.D.   On: 08/21/2023 00:07   CT Angio Chest PE W and/or Wo Contrast  Result Date: 08/20/2023 CLINICAL DATA:  Pulmonary  embolus suspected with high probability. Chest pain in the cancer patient. EXAM: CT ANGIOGRAPHY CHEST WITH CONTRAST TECHNIQUE: Multidetector CT imaging of the chest was performed using the standard protocol during bolus administration of intravenous contrast. Multiplanar CT image reconstructions and MIPs were obtained to evaluate the vascular anatomy. RADIATION DOSE REDUCTION: This exam was performed according to the departmental dose-optimization program which includes automated exposure control, adjustment of the mA and/or kV according to patient size and/or use of iterative reconstruction technique. CONTRAST:  OMNIPAQUE IOHEXOL 350 MG/ML SOLN COMPARISON:  CT chest 02/03/2023.  PET-CT 07/06/2023 FINDINGS: Cardiovascular: Technically adequate study with good opacification of the central and segmental pulmonary arteries and mild motion artifact. No focal filling defects are demonstrated. No evidence of significant pulmonary embolus. Mild cardiac enlargement. No pericardial effusions. Normal caliber thoracic aorta. No aortic dissection. 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 Esophagus is decompressed. Thyroid gland is unremarkable. Prominent lymphadenopathy in the left axilla and left supraclavicular region. Left axillary lymph nodes measure up to about 2.3 cm short axis dimension. Lymph nodes are similar in size to prior PET-CT. No significant  mediastinal lymphadenopathy. Lungs/Pleura: Motion artifact limits examination. There is patchy airspace disease throughout both lungs. This could indicate multifocal pneumonia or edema. No pleural effusions. No pneumothorax. Upper Abdomen: Enlarged lymph nodes in the cardiophrenic angle. No acute abnormalities demonstrated in the visualized upper abdomen. 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 Review of the MIP images confirms the above  findings. IMPRESSION: 1. No evidence of significant pulmonary embolus. 2. Patchy airspace disease throughout both lungs likely representing edema or multifocal pneumonia. 3. Metastatic lymphadenopathy in the left axilla and supraclavicular region. 4. Diffuse sclerotic skeletal metastasis. Electronically Signed   By: Burman Nieves M.D.   On: 08/20/2023 21:42   CT ABDOMEN PELVIS W CONTRAST  Result Date: 08/20/2023 CLINICAL DATA:  Abdominal pain, acute, nonlocalized. Current cancer patient. Pain in abdomen. EXAM: CT ABDOMEN AND PELVIS WITH CONTRAST TECHNIQUE: Multidetector CT imaging of the abdomen and pelvis was performed using the standard protocol following bolus administration of intravenous contrast. RADIATION DOSE REDUCTION: This exam was performed according to the departmental dose-optimization program which includes automated exposure control, adjustment of the mA and/or kV according to patient size and/or use of iterative reconstruction technique. CONTRAST:  OMNIPAQUE IOHEXOL 350 MG/ML SOLN COMPARISON:  Ultrasound pelvis 07/22/2023, CT abdomen pelvis 07/22/2023, pet ct 07/06/23 FINDINGS: Lower chest: Please see separately dictated CT angiography chest 08/20/2023 Hepatobiliary: No focal liver abnormality. Multiple calcified gallstones within the gallbladder lumen. Question gallbladder wall thickening and pericholecystic fluid. No biliary dilatation. Pancreas: No focal lesion. Normal pancreatic contour. No surrounding inflammatory changes. No main pancreatic ductal dilatation. Spleen: Normal in size without focal abnormality. Adrenals/Urinary Tract: No adrenal nodule bilaterally. Bilateral kidneys enhance symmetrically. No hydronephrosis. No hydroureter. The urinary bladder is unremarkable. Stomach/Bowel: Stomach is within normal limits. No evidence of bowel wall thickening or dilatation. Appendix appears normal. Vascular/Lymphatic: Asymmetrically hypodense left femoral vein and external iliac vein  compared to the right with interval increase in size compared to prior. No abdominal aorta or iliac aneurysm. Severe atherosclerotic plaque of the aorta and its branches. Persistent prominent but nonenlarged retroperitoneal and mesenteric lymph nodes. No abdominal, pelvic, or inguinal lymphadenopathy. Reproductive: Uterine fibroid (8:83). Right adnexa is unremarkable. Similar-appearing heterogeneous hypodense 5.4 x 4.6 cm left adnexal/ovarian mass. Other: Trace volume rectouterine pouch free fluid. No intraperitoneal free gas. No organized fluid collection. Musculoskeletal: No abdominal wall hernia or abnormality. Redemonstrate of diffuse axial and appendicular sclerotic metastases. No acute displaced fracture. Multilevel degenerative changes of the spine. IMPRESSION: 1. Cholelithiasis with question gallbladder wall thickening and pericholecystic fluid. Recommend right upper quadrant ultrasound for further evaluation. 2. Asymmetrically hypodense left femoral vein and external iliac vein compared to the right with interval increase in size compared to prior. Query underlying thrombus. Consider ultrasound if clinically indicated. 3. Similar-appearing heterogeneous hypodense 5.4 x 4.6 cm left adnexal/ovarian mass. 4. Trace volume rectouterine pouch free fluid. 5. Persistent diffuse axial and appendicular sclerotic metastases. 6. Uterine fibroid. 7. Please see separately dictated CT angiography chest 08/20/2023. Electronically Signed   By: Tish Frederickson M.D.   On: 08/20/2023 21:42   DG Chest 2 View  Result Date: 08/20/2023 CLINICAL DATA:  Chest pain. EXAM: CHEST - 2 VIEW COMPARISON:  07/22/2023. FINDINGS: Bilateral lung fields are clear. Bilateral costophrenic angles are clear. Normal cardio-mediastinal silhouette. No acute osseous abnormalities. Redemonstration of extensive sclerotic metastases throughout the imaged bones. No pathological fracture seen. The soft tissues are within normal limits. Right-sided CT  Port-A-Cath is seen with its tip overlying  the cavoatrial junction region. IMPRESSION: 1. No active cardiopulmonary disease. 2. Diffuse osseous metastases. Electronically Signed   By: Jules Schick M.D.   On: 08/20/2023 15:27    PERFORMANCE STATUS (ECOG) : 2 - Symptomatic, <50% confined to bed  Review of Systems Unless otherwise noted, a complete review of systems is negative.  Physical Exam General: NAD Cardiovascular: regular rate and rhythm Pulmonary: clear ant fields Abdomen: soft, nontender, + bowel sounds GU: no suprapubic tenderness Extremities: no edema, no joint deformities Skin: no rashes Neurological: Weakness but otherwise nonfocal  IMPRESSION: Follow-up visit.  Patient reports that pain is overall improved today.  Now having more generalized/low back pain.  No longer endorses pain at cholecystostomy drain.  Patient also utilizing significantly less hydromorphone.  In total, only 2 mg IV hydromorphone over the past 24 hours.  Discussed pain regimen with patient and brother.  Will discontinue PCA and utilize oxycodone as needed for breakthrough pain.  Will leave as needed IV hydromorphone as backup.  Can further liberalize fentanyl patch as needed.  Patient says that she plans to go to rehab following discharge from the hospital.  She then expects that she will likely move to [place] upon discharge from rehab.  Discussed CT results suggestive of progression.  Patient's goals are aligned with continued cancer treatment, although she has been off treatment now for 2 months.  Patient had bowel movement today  PLAN: -Continue current scope of treatment -Discontinue PCA -Continue transdermal fentanyl  -Oxycodone as needed for breakthrough pain (liberalized to home dosing) -Will leave as needed hydromorphone as backup -Daily bowel regimen  Case and plan discussed with Dr. Orlie Dakin and Dr. Mayford Knife   Time Total: 25 minutes  Visit consisted of counseling and  education dealing with the complex and emotionally intense issues of symptom management and palliative care in the setting of serious and potentially life-threatening illness.Greater than 50%  of this time was spent counseling and coordinating care related to the above assessment and plan.  Signed by: Laurette Schimke, PhD, NP-C

## 2023-09-15 DIAGNOSIS — R1084 Generalized abdominal pain: Secondary | ICD-10-CM | POA: Diagnosis not present

## 2023-09-15 LAB — BASIC METABOLIC PANEL
Anion gap: 8 (ref 5–15)
BUN: 11 mg/dL (ref 6–20)
CO2: 24 mmol/L (ref 22–32)
Calcium: 8.2 mg/dL — ABNORMAL LOW (ref 8.9–10.3)
Chloride: 93 mmol/L — ABNORMAL LOW (ref 98–111)
Creatinine, Ser: 0.43 mg/dL — ABNORMAL LOW (ref 0.44–1.00)
GFR, Estimated: >60 mL/min (ref >60)
Glucose, Bld: 101 mg/dL — ABNORMAL HIGH (ref 70–99)
Potassium: 3.6 mmol/L (ref 3.5–5.1)
Sodium: 125 mmol/L — ABNORMAL LOW (ref 135–145)

## 2023-09-15 LAB — CBC
HCT: 23.5 % — ABNORMAL LOW (ref 36.0–46.0)
Hemoglobin: 7.9 g/dL — ABNORMAL LOW (ref 12.0–15.0)
MCH: 29.7 pg (ref 26.0–34.0)
MCHC: 33.6 g/dL (ref 30.0–36.0)
MCV: 88.3 fL (ref 80.0–100.0)
Platelets: 75 10*3/uL — ABNORMAL LOW (ref 150–400)
RBC: 2.66 MIL/uL — ABNORMAL LOW (ref 3.87–5.11)
RDW: 17.2 % — ABNORMAL HIGH (ref 11.5–15.5)
WBC: 6.3 10*3/uL (ref 4.0–10.5)
nRBC: 1 % — ABNORMAL HIGH (ref 0.0–0.2)

## 2023-09-15 NOTE — TOC Progression Note (Signed)
Transition of Care [facility]) - Progression Note    Patient Details  Name: Sheryl Silva MRN: [id number] Date of Birth: May 21, 1976  Transition of Care [facility]) CM/SW Contact  Allena Katz, LCSW Phone Number: 09/15/2023, 9:05 AM  Clinical Narrative:    CSW spoke with patients brother who reports they are wanting a rehab referral sent and plan to get patient to [facility] with her sister and cousins after rehab. PCA pump discontinued yesterday.          Expected Discharge Plan and Services                                               Social Determinants of Health (SDOH) Interventions SDOH Screenings   Food Insecurity: No Food Insecurity (09/09/2023)  Recent Concern: Food Insecurity - Food Insecurity Present (07/22/2023)  Housing: Low Risk  (09/09/2023)  Transportation Needs: No Transportation Needs (09/09/2023)  Recent Concern: Transportation Needs - Unmet Transportation Needs (07/22/2023)  Utilities: Not At Risk (09/09/2023)  Alcohol Screen: Low Risk  (02/15/2023)  Depression (PHQ2-9): Low Risk  (02/14/2023)  Financial Resource Strain: Low Risk  (05/09/2023)   Received from [facility], [facility]  Recent Concern: Financial Resource Strain - Medium Risk (02/15/2023)  Physical Activity: Inactive (02/15/2023)  Social Connections: Socially Isolated (02/15/2023)  Stress: Stress Concern Present (02/15/2023)  Tobacco Use: Medium Risk (09/08/2023)    Readmission Risk Interventions    09/10/2023    1:52 PM 08/22/2023   10:03 PM 08/04/2023    8:47 AM  Readmission Risk Prevention Plan  Transportation Screening Complete Complete   PCP or Specialist Appt within 3-5 Days Complete Complete Complete  HRI or Home Care Consult Complete Complete   Social Work Consult for Recovery Care Planning/Counseling Complete --   Palliative Care Screening Not Applicable Not Applicable   Medication Review Oceanographer) Complete Complete

## 2023-09-15 NOTE — [place] FL2 (Signed)
[place] MEDICAID FL2 LEVEL OF CARE FORM     IDENTIFICATION  Patient Name: Sheryl Silva Birthdate: 1976-10-23 Sex: female Admission Date (Current Location): 09/08/2023  [place] and [place] Number:  Chiropodist and Address:  [facility], [address], [place], [place] [address]      Provider Number: [id number]  Attending Physician Name and Address:  Charise Killian, MD  Relative Name and Phone Number:  Sherilyn Banker (Daughter)  [phone]    Current Level of Care: Hospital Recommended Level of Care: Skilled Nursing Facility Prior Approval Number:    Date Approved/Denied:   PASRR Number: [id number]  Discharge Plan: SNF    Current Diagnoses: Patient Active Problem List   Diagnosis Date Noted   Pelvic mass 09/14/2023   Generalized abdominal pain 09/14/2023   Nausea and vomiting 09/09/2023   Transaminitis 09/09/2023   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   Acute cholecystitis 08/21/2023   Multifocal pneumonia 08/21/2023   Cholecystitis 08/21/2023   CMV colitis (HCC) 07/30/2023   Palliative care encounter 07/26/2023   Left sided colitis without complications (HCC) 07/25/2023   Colitis 07/22/2023   Dehydration 06/17/2023   Panic disorder (episodic paroxysmal anxiety) 04/25/2023   Signet ring cell adenocarcinoma (HCC) 02/14/2023   Constipation 02/07/2023   Cancer related pain 02/07/2023   Acute back pain 02/04/2023   Anxiety 02/04/2023   Lower abdominal pain 02/04/2023   Ovarian mass, left 02/04/2023   Palliative care by specialist 02/04/2023   Primary cancer of unknown site [facility]) 02/04/2023   Shortness of breath 02/04/2023   Adenocarcinoma (HCC) 02/04/2023   Insurance coverage problems 02/03/2023   Intractable pain 02/03/2023   DDD (degenerative disc disease), cervical 01/27/2023   Degeneration of lumbar intervertebral disc 01/27/2023   Generalized anxiety disorder 01/27/2023   Long term (current)  use of opiate analgesic 01/27/2023   Severe episode of recurrent major depressive disorder, without psychotic features (HCC) 01/27/2023   Buerger's disease (HCC) 01/13/2023   Pain in finger of left hand 12/18/2022   Pain in finger of right hand 12/18/2022   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   Essential (primary) hypertension 12/18/2022   Cyanosis of tip of finger 12/10/2022   Hypertension 12/10/2022   Hypoalbuminemia 06/23/2022   Hypokalemia 06/23/2022   Moderate episode of recurrent major depressive disorder (HCC) 06/23/2022   Stenosis of cervical spine 04/06/2022   Low TSH level 05/23/2017   Tobacco user 05/23/2017    Orientation RESPIRATION BLADDER Height & Weight     Self, Time, Situation, Place  Normal Continent Weight: 135 lb 2.3 oz (61.3 kg) Height:  5\' 6"  (167.6 cm)  BEHAVIORAL SYMPTOMS/MOOD NEUROLOGICAL BOWEL NUTRITION STATUS       (billiary tube)    AMBULATORY STATUS COMMUNICATION OF NEEDS Skin   Extensive Assist   Normal                       Personal Care Assistance Level of Assistance  Bathing, Dressing, Feeding Bathing Assistance: Maximum assistance Feeding assistance: Limited assistance Dressing Assistance: Maximum assistance     Functional Limitations Info  Hearing, Speech, Sight Sight Info: Adequate Hearing Info: Adequate Speech Info: Adequate    SPECIAL CARE FACTORS FREQUENCY  OT (By licensed OT), PT (By licensed PT)     PT Frequency: 5 times a week OT Frequency: 5 times a week            Contractures Contractures Info: Not present    Additional Factors Info  Code Status, Allergies Code Status Info: FULL Allergies Info: Nifedipine           Current Medications (09/15/2023):  This is the current hospital active medication list Current Facility-Administered Medications  Medication Dose Route Frequency Provider Last Rate Last Admin   acetaminophen (TYLENOL) tablet 650 mg  650 mg Oral Q6H PRN Gertha Calkin, MD   650 mg at  09/15/23 0301   Or   acetaminophen (TYLENOL) suppository 650 mg  650 mg Rectal Q6H PRN Gertha Calkin, MD       albuterol (PROVENTIL) (2.5 MG/3ML) 0.083% nebulizer solution 2.5 mg  2.5 mg Inhalation Q6H PRN Gertha Calkin, MD       ALPRAZolam Prudy Feeler) tablet 0.25 mg  0.25 mg Oral TID PRN Darlin Priestly, MD   0.25 mg at 09/14/23 2059   Chlorhexidine Gluconate Cloth 2 % PADS 6 each  6 each Topical Daily Darlin Priestly, MD   6 each at 09/14/23 1033   DULoxetine (CYMBALTA) DR capsule 60 mg  60 mg Oral Daily Irena Cords V, MD   60 mg at 09/15/23 0913   enoxaparin (LOVENOX) injection 40 mg  40 mg Subcutaneous Q24H Darlin Priestly, MD   40 mg at 09/15/23 0913   feeding supplement (ENSURE ENLIVE / ENSURE PLUS) liquid 237 mL  237 mL Oral TID BM Darlin Priestly, MD   237 mL at 09/14/23 2057   fentaNYL (DURAGESIC) 50 MCG/HR 1 patch  1 patch Transdermal Q72H Borders, Daryl Eastern, NP   1 patch at 09/14/23 1337   gabapentin (NEURONTIN) capsule 600 mg  600 mg Oral TID Gertha Calkin, MD   600 mg at 09/15/23 0912   hydrocortisone cream 1 %   Topical BID Charise Killian, MD       HYDROmorphone (DILAUDID) injection 1 mg  1 mg Intravenous Q2H PRN Manuela Schwartz, NP   1 mg at 09/15/23 0601   iodixanol (VISIPAQUE) 320 MG/ML injection 5 mL  5 mL Other Once PRN El-Abd, Aaron Edelman, MD       magnesium hydroxide (MILK OF MAGNESIA) suspension 30 mL  30 mL Oral Daily PRN Darlin Priestly, MD   30 mL at 09/13/23 2112   metoprolol tartrate (LOPRESSOR) tablet 25 mg  25 mg Oral BID Darlin Priestly, MD   25 mg at 09/15/23 0912   ondansetron (ZOFRAN) 8 mg in sodium chloride 0.9 % 50 mL IVPB  8 mg Intravenous Q8H PRN Gertha Calkin, MD 216 mL/hr at 09/14/23 1959 8 mg at 09/14/23 1959   oxyCODONE (Oxy IR/ROXICODONE) immediate release tablet 10 mg  10 mg Oral Q4H PRN Borders, Daryl Eastern, NP   10 mg at 09/15/23 0300   pantoprazole (PROTONIX) injection 40 mg  40 mg Intravenous Q12H Irena Cords V, MD   40 mg at 09/15/23 0913   piperacillin-tazobactam (ZOSYN) IVPB 3.375 g   3.375 g Intravenous Q8H Irena Cords V, MD 12.5 mL/hr at 09/15/23 0243 3.375 g at 09/15/23 0243   prochlorperazine (COMPAZINE) injection 5 mg  5 mg Intravenous Q6H PRN Darlin Priestly, MD       senna Mancel Parsons) tablet 8.6 mg  1 tablet Oral Daily Borders, Joshua R, NP   8.6 mg at 09/15/23 0913   sodium chloride flush (NS) 0.9 % injection 3 mL  3 mL Intravenous Q12H Irena Cords V, MD   3 mL at 09/15/23 0923   sodium chloride flush (NS) 0.9 % injection 5 mL  5 mL Intracatheter Q8H Piscoya, Jose,  MD   5 mL at 09/15/23 9629   Facility-Administered Medications Ordered in Other Encounters  Medication Dose Route Frequency Provider Last Rate Last Admin   heparin lock flush 100 unit/mL  500 Units Intravenous Once Borders, Ivin Booty R, NP       sodium chloride flush (NS) 0.9 % injection 10 mL  10 mL Intracatheter PRN Jeralyn Ruths, MD   10 mL at 06/29/23 1430     Discharge Medications: Please see discharge summary for a list of discharge medications.  Relevant Imaging Results:  Relevant Lab Results:   Additional Information SS [id number]  Allena Katz, LCSW

## 2023-09-15 NOTE — Progress Notes (Signed)
PROGRESS NOTE    Sheryl Silva  ZHY:[id number] DOB: [birth year] DOA: 09/08/2023 PCP: [name], MD    Assessment & Plan:   Principal Problem:   Nausea and vomiting Active Problems:   Hypertension   Signet ring cell adenocarcinoma (HCC)   Acute cholecystitis   Transaminitis   Metastatic signet ring cell carcinoma (HCC)   Pelvic mass   Generalized abdominal pain  Assessment and Plan: Acute cholecystitis: s/p cholecystostomy drain placed by IR on 09/09/23.Continue on IV zosyn. Apparently cultures were not sent so they were ordered next day.     Left groin pain: likely due to ovarian mass. PCA dilaudid pump was d/c. US pelvic with doppler, which showed known left ovary mass with reduced blood flow. Continue on fentanyl patch, gabapentin, oxycodone. Palliative care following and recs apprec. Gyn onc consulted and recs apprec.   Stage IV metastatic signet ring adenocarcinoma: continue w/ pain management.  Currently not on treatment. 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  Most recent CMV DNA from 09/08/23 was neg. Saw ID Dr. Rivka Safer on 09/08/23, and rec holding ganciclovir while not on chemo.  But if she plans to start chemo she will have to be on suppressive therapy with 900mg  Valganciclovir every day.   Normocytic anemia: s/p 1 unit of pRBC transfused so far. Will transfuse if Hb < 7.0    Hyponatremia: labile. Present since August 2024. Will continue to monitor    Constipation: continue on miralax        DVT prophylaxis: lovenox  Code Status: full  Family Communication: discussed pt's care w/ pt's brother, Iantha Fallen and answered his questions Disposition Plan: likely d/c to SNF   Level of care: Med-Surg Status is: Inpatient Remains inpatient appropriate because: severity of illness    Consultants:  Gyn onco  Palliative care   Procedures:  Antimicrobials: zosyn    Subjective: Pt c/o pain still   Objective: Vitals:    09/15/23 0152 09/15/23 0244 09/15/23 0500 09/15/23 0512  BP: (!) 153/97 (!) 154/97  (!) 139/93  Pulse: (!) 104 99  98  Resp: 20   19  Temp: 98.7 F (37.1 C)   98.3 F (36.8 C)  TempSrc:      SpO2: 100%   100%  Weight:   61.3 kg   Height:        Intake/Output Summary (Last 24 hours) at 09/15/2023 0812 Last data filed at 09/15/2023 0203 Gross per 24 hour  Intake 8 ml  Output --  Net 8 ml   Filed Weights   09/12/23 0438 09/14/23 0450 09/15/23 0500  Weight: 61.1 kg 61.2 kg 61.3 kg    Examination:  General exam: appear comfortable  Respiratory system: clear breath sounds b/l  Cardiovascular system: S1/S2+. No rubs or clicks   Gastrointestinal system: Abd is soft, NT, hypoactive bowel sounds  Central nervous system: sleepy. Moves all extremities  Psychiatry: judgement and insight appears at baseline. Flat mood and affect     Data Reviewed: I have personally reviewed following labs and imaging studies  CBC: Recent Labs  Lab 09/08/23 1332 09/09/23 0735 09/10/23 0550 09/11/23 0600 09/12/23 0440 09/12/23 2019 09/13/23 0437 09/14/23 0452  WBC 4.9   < > 4.5 4.7 4.7  --  5.0 5.8  NEUTROABS 3.6  --   --   --   --   --   --   --   HGB 8.1*   < > 7.6* 7.2* 6.9* 8.5* 8.5*  8.1*  HCT 24.9*   < > 23.6* 22.1* 20.9* 25.9* 25.6* 24.9*  MCV 92.2   < > 93.3 92.9 90.9  --  89.5 91.2  PLT 166   < > 125* 113* 114*  --  97* 82*   < > = values in this interval not displayed.   Basic Metabolic Panel: Recent Labs  Lab 09/10/23 0550 09/11/23 0600 09/12/23 0440 09/13/23 0437 09/14/23 0452  NA 129* 127* 129* 126* 129*  K 3.4* 3.7 3.8 3.9 4.1  CL 100 102 102 98 93*  CO2 18* 20* 21* 22 22  GLUCOSE 80 89 94 91 92  BUN 8 10 9 8 9   CREATININE 0.54 0.49 0.44 0.47 0.39*  CALCIUM 8.2* 8.2* 8.3* 8.1* 8.5*  MG 1.8 1.7 1.8 1.9 1.8   GFR: Estimated Creatinine Clearance: 80.5 mL/min (A) (by C-G formula based on SCr of 0.39 mg/dL (L)). Liver Function Tests: Recent Labs  Lab  09/10/23 0550 09/11/23 0600 09/12/23 0440 09/13/23 0437 09/14/23 0452  AST 74* 37 31 30 30   ALT 139* 84* 59* 43 34  ALKPHOS 1,098* 833* 654* 609* 516*  BILITOT 0.7 0.8 0.6 1.0 0.9  PROT 6.5 6.7 6.2* 6.4* 6.5  ALBUMIN 2.8* 2.7* 2.4* 2.6* 2.4*   Recent Labs  Lab 09/08/23 2117  LIPASE 20   No results for input(s): "AMMONIA" in the last 168 hours. Coagulation Profile: Recent Labs  Lab 09/08/23 1948  INR 1.2   Cardiac Enzymes: No results for input(s): "CKTOTAL", "CKMB", "CKMBINDEX", "TROPONINI" in the last 168 hours. BNP (last 3 results) No results for input(s): "PROBNP" in the last 8760 hours. HbA1C: No results for input(s): "HGBA1C" in the last 72 hours. CBG: Recent Labs  Lab 09/10/23 0821 09/14/23 0804  GLUCAP 72 84   Lipid Profile: No results for input(s): "CHOL", "HDL", "LDLCALC", "TRIG", "CHOLHDL", "LDLDIRECT" in the last 72 hours. Thyroid Function Tests: No results for input(s): "TSH", "T4TOTAL", "FREET4", "T3FREE", "THYROIDAB" in the last 72 hours. Anemia Panel: No results for input(s): "VITAMINB12", "FOLATE", "FERRITIN", "TIBC", "IRON", "RETICCTPCT" in the last 72 hours. Sepsis Labs: Recent Labs  Lab 09/08/23 1948  LATICACIDVEN 0.7    Recent Results (from the past 240 hour(s))  Blood culture (routine x 2)     Status: None   Collection Time: 09/08/23  7:48 PM   Specimen: BLOOD  Result Value Ref Range Status   Specimen Description BLOOD BLOOD RIGHT ARM  Final   Special Requests   Final    BOTTLES DRAWN AEROBIC AND ANAEROBIC Blood Culture adequate volume   Culture   Final    NO GROWTH 5 DAYS Performed at [facility], [address], [place], [place] [address]    Report Status 09/13/2023 FINAL  Final  Blood culture (routine x 2)     Status: None   Collection Time: 09/08/23  7:48 PM   Specimen: BLOOD  Result Value Ref Range Status   Specimen Description BLOOD BLOOD LEFT HAND  Final   Special Requests   Final    BOTTLES DRAWN AEROBIC AND  ANAEROBIC Blood Culture adequate volume   Culture   Final    NO GROWTH 5 DAYS Performed at [facility], [address], [place], [place] [address]    Report Status 09/13/2023 FINAL  Final         Radiology Studies: No results found.      Scheduled Meds:  Chlorhexidine Gluconate Cloth  6 each Topical Daily   DULoxetine  60  mg Oral Daily   enoxaparin (LOVENOX) injection  40 mg Subcutaneous Q24H   feeding supplement  237 mL Oral TID BM   fentaNYL  1 patch Transdermal Q72H   gabapentin  600 mg Oral TID   hydrocortisone cream   Topical BID   metoprolol tartrate  25 mg Oral BID   pantoprazole (PROTONIX) IV  40 mg Intravenous Q12H   senna  1 tablet Oral Daily   sodium chloride flush  3 mL Intravenous Q12H   sodium chloride flush  5 mL Intracatheter Q8H   Continuous Infusions:  ondansetron (ZOFRAN) IV 8 mg (09/14/23 1959)   piperacillin-tazobactam (ZOSYN)  IV 3.375 g (09/15/23 0243)     LOS: 6 days        Charise Killian, MD [facility] Pager [phone]  If 7PM-7AM, please contact night-coverage [email or web address] Password [email or web address] 09/15/2023, 8:12 AM

## 2023-09-15 NOTE — Progress Notes (Signed)
OT Cancellation Note  Patient Details Name: Sheryl Silva MRN: [id number] DOB: 1976/06/28   Cancelled Treatment:    Reason Eval/Treat Not Completed: Pain limiting ability to participate. Pt reports recently mobilizing with nursing, reports increased pain/fatigue and defers session at this time.   Kathie Dike, M.S. OTR/L  09/15/23, 3:24 PM  ascom [phone]

## 2023-09-15 NOTE — Plan of Care (Addendum)
Patient is alert and oriented X4. She has been getting prn pain medicine for abdominal pain as order. She has a drain in her right side of abdomen and dressing looks clean, dry and intact.  Problem: Education: Goal: Knowledge of General Education information will improve Description: Including pain rating scale, medication(s)/side effects and non-pharmacologic comfort measures Outcome: Progressing   Problem: Health Behavior/Discharge Planning: Goal: Ability to manage health-related needs will improve Outcome: Progressing   Problem: Clinical Measurements: Goal: Ability to maintain clinical measurements within normal limits will improve Outcome: Progressing Goal: Will remain free from infection Outcome: Progressing Goal: Diagnostic test results will improve Outcome: Progressing Goal: Respiratory complications will improve Outcome: Progressing Goal: Cardiovascular complication will be avoided Outcome: Progressing   Problem: Activity: Goal: Risk for activity intolerance will decrease Outcome: Progressing   Problem: Nutrition: Goal: Adequate nutrition will be maintained Outcome: Progressing   Problem: Coping: Goal: Level of anxiety will decrease Outcome: Progressing   Problem: Elimination: Goal: Will not experience complications related to bowel motility Outcome: Progressing Goal: Will not experience complications related to urinary retention Outcome: Progressing   Problem: Pain Management: Goal: General experience of comfort will improve Outcome: Progressing   Problem: Safety: Goal: Ability to remain free from injury will improve Outcome: Progressing   Problem: Skin Integrity: Goal: Risk for impaired skin integrity will decrease Outcome: Progressing

## 2023-09-15 NOTE — Plan of Care (Signed)

## 2023-09-16 ENCOUNTER — Encounter: Payer: Self-pay | Admitting: Oncology

## 2023-09-16 ENCOUNTER — Inpatient Hospital Stay: Payer: 59 | Admitting: Hospice and Palliative Medicine

## 2023-09-16 DIAGNOSIS — Z515 Encounter for palliative care: Secondary | ICD-10-CM | POA: Diagnosis not present

## 2023-09-16 DIAGNOSIS — R1084 Generalized abdominal pain: Secondary | ICD-10-CM | POA: Diagnosis not present

## 2023-09-16 LAB — CBC
HCT: 23.7 % — ABNORMAL LOW (ref 36.0–46.0)
Hemoglobin: 8 g/dL — ABNORMAL LOW (ref 12.0–15.0)
MCH: 29.9 pg (ref 26.0–34.0)
MCHC: 33.8 g/dL (ref 30.0–36.0)
MCV: 88.4 fL (ref 80.0–100.0)
Platelets: 71 10*3/uL — ABNORMAL LOW (ref 150–400)
RBC: 2.68 MIL/uL — ABNORMAL LOW (ref 3.87–5.11)
RDW: 17 % — ABNORMAL HIGH (ref 11.5–15.5)
WBC: 6.8 10*3/uL (ref 4.0–10.5)
nRBC: 1.2 % — ABNORMAL HIGH (ref 0.0–0.2)

## 2023-09-16 LAB — BASIC METABOLIC PANEL
Anion gap: 11 (ref 5–15)
BUN: 11 mg/dL (ref 6–20)
CO2: 22 mmol/L (ref 22–32)
Calcium: 8.1 mg/dL — ABNORMAL LOW (ref 8.9–10.3)
Chloride: 92 mmol/L — ABNORMAL LOW (ref 98–111)
Creatinine, Ser: 0.38 mg/dL — ABNORMAL LOW (ref 0.44–1.00)
GFR, Estimated: >60 mL/min (ref >60)
Glucose, Bld: 103 mg/dL — ABNORMAL HIGH (ref 70–99)
Potassium: 3.9 mmol/L (ref 3.5–5.1)
Sodium: 125 mmol/L — ABNORMAL LOW (ref 135–145)

## 2023-09-16 MED ORDER — PANTOPRAZOLE SODIUM 40 MG PO TBEC
40.0000 mg | DELAYED_RELEASE_TABLET | Freq: Two times a day (BID) | ORAL | Status: DC
  Administered 2023-09-16 – 2023-10-04 (×36): 40 mg via ORAL
  Filled 2023-09-16 (×36): qty 1

## 2023-09-16 MED ORDER — ADULT MULTIVITAMIN W/MINERALS CH
1.0000 | ORAL_TABLET | Freq: Every day | ORAL | Status: DC
  Administered 2023-09-16 – 2023-10-03 (×18): 1 via ORAL
  Filled 2023-09-16 (×19): qty 1

## 2023-09-16 MED ORDER — OXYCODONE HCL 5 MG PO TABS
15.0000 mg | ORAL_TABLET | Freq: Four times a day (QID) | ORAL | Status: DC | PRN
  Administered 2023-09-16 – 2023-09-17 (×3): 15 mg via ORAL
  Filled 2023-09-16 (×4): qty 3

## 2023-09-16 MED ORDER — FENTANYL 75 MCG/HR TD PT72
1.0000 | MEDICATED_PATCH | TRANSDERMAL | Status: DC
  Administered 2023-09-16 – 2023-09-22 (×3): 1 via TRANSDERMAL
  Filled 2023-09-16 (×3): qty 1

## 2023-09-16 NOTE — Progress Notes (Signed)
PROGRESS NOTE    Sheryl Silva  BJY:[id number] DOB: [birth year] DOA: 09/08/2023 PCP: [name], MD    Assessment & Plan:   Principal Problem:   Nausea and vomiting Active Problems:   Hypertension   Signet ring cell adenocarcinoma (HCC)   Acute cholecystitis   Transaminitis   Metastatic signet ring cell carcinoma (HCC)   Pelvic mass   Generalized abdominal pain  Assessment and Plan: Acute cholecystitis: s/p cholecystostomy drain placed by IR on 09/09/23. Continue on IV zosyn. Apparently cultures were not sent so they were ordered next day but nothing resulted so far but WBC is WNL and no fevers      Left groin pain: likely due to ovarian mass. PCA dilaudid pump was d/c. US pelvic with doppler, which showed known left ovary mass with reduced blood flow. Continue on fentanyl patch, gabapentin, oxycodone. Palliative care following and recs apprec. Gyn onc consulted and recs apprec.   Stage IV metastatic signet ring adenocarcinoma: continue w/ pain management. Increased oxycodone today. Currently not on treatment. 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  Most recent CMV DNA from 09/08/23 was neg. Saw ID Dr. Rivka Safer on 09/08/23, and rec holding ganciclovir while not on chemo.  But if she plans to start chemo she will have to be on suppressive therapy with 900mg  Valganciclovir every day.   Normocytic anemia: s/p 1 unit of pRBCs transfused so far. Will transfuse if Hb < 7.0    Hyponatremia: labile. Present since August 2024. Will continue to monitor    Constipation: continue on miralax         DVT prophylaxis: lovenox  Code Status: full  Family Communication: called pt's brother, Iantha Fallen, no answer so I left a voicemail Disposition Plan: likely d/c to SNF   Level of care: Med-Surg Status is: Inpatient Remains inpatient appropriate because: severity of illness    Consultants:  Gyn onco  Palliative care    Procedures:  Antimicrobials: zosyn    Subjective: Pt is still c/o pain over entire body   Objective: Vitals:   09/15/23 1732 09/15/23 2000 09/15/23 2338 09/16/23 0400  BP: 120/80 (!) 142/99 (!) 149/93 (!) 143/94  Pulse:  (!) 110    Resp:  20 20 19   Temp: 98.6 F (37 C) 98.2 F (36.8 C) 98.6 F (37 C) 99.4 F (37.4 C)  TempSrc: Oral Oral    SpO2:  100% 100% 100%  Weight:      Height:        Intake/Output Summary (Last 24 hours) at 09/16/2023 0803 Last data filed at 09/16/2023 0314 Gross per 24 hour  Intake 5 ml  Output 220 ml  Net -215 ml   Filed Weights   09/12/23 0438 09/14/23 0450 09/15/23 0500  Weight: 61.1 kg 61.2 kg 61.3 kg    Examination:  General exam: appears lethargic  Respiratory system: clear breath sounds b/l  Cardiovascular system: S1 & S2+. No rubs or clicks    Gastrointestinal system: Abd is soft, NT, ND & hypoactive bowel sounds  Central nervous system: lethargic. Moves all extremities  Psychiatry: judgement and insight appears at baseline. Flat mood and affect     Data Reviewed: I have personally reviewed following labs and imaging studies  CBC: Recent Labs  Lab 09/11/23 0600 09/12/23 0440 09/12/23 2019 09/13/23 0437 09/14/23 0452 09/15/23 0812  WBC 4.7 4.7  --  5.0 5.8 6.3  HGB 7.2* 6.9* 8.5* 8.5* 8.1* 7.9*  HCT 22.1* 20.9* 25.9*  25.6* 24.9* 23.5*  MCV 92.9 90.9  --  89.5 91.2 88.3  PLT 113* 114*  --  97* 82* 75*   Basic Metabolic Panel: Recent Labs  Lab 09/10/23 0550 09/11/23 0600 09/12/23 0440 09/13/23 0437 09/14/23 0452 09/15/23 0812  NA 129* 127* 129* 126* 129* 125*  K 3.4* 3.7 3.8 3.9 4.1 3.6  CL 100 102 102 98 93* 93*  CO2 18* 20* 21* 22 22 24   GLUCOSE 80 89 94 91 92 101*  BUN 8 10 9 8 9 11   CREATININE 0.54 0.49 0.44 0.47 0.39* 0.43*  CALCIUM 8.2* 8.2* 8.3* 8.1* 8.5* 8.2*  MG 1.8 1.7 1.8 1.9 1.8  --    GFR: Estimated Creatinine Clearance: 80.5 mL/min (A) (by C-G formula based on SCr of 0.43 mg/dL  (L)). Liver Function Tests: Recent Labs  Lab 09/10/23 0550 09/11/23 0600 09/12/23 0440 09/13/23 0437 09/14/23 0452  AST 74* 37 31 30 30   ALT 139* 84* 59* 43 34  ALKPHOS 1,098* 833* 654* 609* 516*  BILITOT 0.7 0.8 0.6 1.0 0.9  PROT 6.5 6.7 6.2* 6.4* 6.5  ALBUMIN 2.8* 2.7* 2.4* 2.6* 2.4*   No results for input(s): "LIPASE", "AMYLASE" in the last 168 hours.  No results for input(s): "AMMONIA" in the last 168 hours. Coagulation Profile: No results for input(s): "INR", "PROTIME" in the last 168 hours.  Cardiac Enzymes: No results for input(s): "CKTOTAL", "CKMB", "CKMBINDEX", "TROPONINI" in the last 168 hours. BNP (last 3 results) No results for input(s): "PROBNP" in the last 8760 hours. HbA1C: No results for input(s): "HGBA1C" in the last 72 hours. CBG: Recent Labs  Lab 09/10/23 0821 09/14/23 0804  GLUCAP 72 84   Lipid Profile: No results for input(s): "CHOL", "HDL", "LDLCALC", "TRIG", "CHOLHDL", "LDLDIRECT" in the last 72 hours. Thyroid Function Tests: No results for input(s): "TSH", "T4TOTAL", "FREET4", "T3FREE", "THYROIDAB" in the last 72 hours. Anemia Panel: No results for input(s): "VITAMINB12", "FOLATE", "FERRITIN", "TIBC", "IRON", "RETICCTPCT" in the last 72 hours. Sepsis Labs: No results for input(s): "PROCALCITON", "LATICACIDVEN" in the last 168 hours.   Recent Results (from the past 240 hour(s))  Blood culture (routine x 2)     Status: None   Collection Time: 09/08/23  7:48 PM   Specimen: BLOOD  Result Value Ref Range Status   Specimen Description BLOOD BLOOD RIGHT ARM  Final   Special Requests   Final    BOTTLES DRAWN AEROBIC AND ANAEROBIC Blood Culture adequate volume   Culture   Final    NO GROWTH 5 DAYS Performed at [facility], [address], [place], [place] [address]    Report Status 09/13/2023 FINAL  Final  Blood culture (routine x 2)     Status: None   Collection Time: 09/08/23  7:48 PM   Specimen: BLOOD  Result Value Ref Range  Status   Specimen Description BLOOD BLOOD LEFT HAND  Final   Special Requests   Final    BOTTLES DRAWN AEROBIC AND ANAEROBIC Blood Culture adequate volume   Culture   Final    NO GROWTH 5 DAYS Performed at [facility], [address], [place], [place] [address]    Report Status 09/13/2023 FINAL  Final         Radiology Studies: No results found.      Scheduled Meds:  Chlorhexidine Gluconate Cloth  6 each Topical Daily   DULoxetine  60 mg Oral Daily   enoxaparin (LOVENOX) injection  40 mg Subcutaneous Q24H   feeding  supplement  237 mL Oral TID BM   fentaNYL  1 patch Transdermal Q72H   gabapentin  600 mg Oral TID   hydrocortisone cream   Topical BID   metoprolol tartrate  25 mg Oral BID   pantoprazole (PROTONIX) IV  40 mg Intravenous Q12H   senna  1 tablet Oral Daily   sodium chloride flush  3 mL Intravenous Q12H   sodium chloride flush  5 mL Intracatheter Q8H   Continuous Infusions:  ondansetron (ZOFRAN) IV 8 mg (09/14/23 1959)   piperacillin-tazobactam (ZOSYN)  IV 3.375 g (09/16/23 0314)     LOS: 7 days        Charise Killian, MD [facility] Pager [phone]  If 7PM-7AM, please contact night-coverage [email or web address] Password [email or web address] 09/16/2023, 8:03 AM

## 2023-09-16 NOTE — Progress Notes (Signed)
Initial Nutrition Assessment  DOCUMENTATION CODES:   Not applicable  INTERVENTION:   -Continue liberalized diet of regular -MVI with minerals daily -Continue Ensure Enlive po TID, each supplement provides 350 kcal and 20 grams of protein -Magic cup TID with meals, each supplement provides 290 kcal and 9 grams of protein   NUTRITION DIAGNOSIS:   Increased nutrient needs related to cancer and cancer related treatments as evidenced by estimated needs.  GOAL:   Patient will meet greater than or equal to 90% of their needs  MONITOR:   PO intake, Supplement acceptance  REASON FOR ASSESSMENT:   Malnutrition Screening Tool    ASSESSMENT:   Pt with medical history significant for stage IV metastatic adenocarcinoma of GI source on chemo and radiation therapy off chemo since 07/18/2023 due to poor tolerance and multiple hospitalizations , HTN, chronic pain and narcotic dependence, anxiety/depression, GERD,hospitalized 08/21/2023 to 08/23/2023 with cholecystitis, which was conservatively managed with IV antibiotics.  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  In chart review, per ID note patient has metastatic signet ring adenocarcinoma was on FOLFOX and Keytruda and received 7 cycles and palliative radiation therapy.  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  Pt admitted with abdominal pain secondary to acute cholecystitis and metastatic gastric cancer.   11/8- s/p placement of chole tube  11/13- PCA pump d/c  Reviewed I/O's: -161 ml x 24 hours and +4.9 L since admission  Drain output (chole tube): 220 ml x 24 hours  Pt is followed by oncology for stage IV metastatic signet ring carcinoma. Palliative care also  following for pain and symptom management.   Pt sleeping soundly at time of visit. Pt did not respond to voice or touch. No family present to provide additional history.   Pt currently on a regular diet. Noted meal completions 100%. Pt also has Ensure supplements ordered, which pt is accepting. Noted lunch tray on tray table, untouched.   Reviewed wt hx; pt has experienced a 16.6% wt loss over the past month, which is not significant for time frame.   Per TOC notes, plan for SNF placement for rehab once medically stable for discharge. MD reports will likely be sometime next week secondary to pain control.   Medications reviewed and include protonix, neurontin, and senokot.   No results found for: "HGBA1C" PTA DM medications are none.   Labs reviewed: Na: 125, CBGS: 84 (inpatient orders for glycemic control are none).    NUTRITION - FOCUSED PHYSICAL EXAM:  Flowsheet Row Most Recent Value  Orbital Region No depletion  Upper Arm Region No depletion  Thoracic and Lumbar Region No depletion  Buccal Region No depletion  Temple Region No depletion  Clavicle Bone Region No depletion  Clavicle and Acromion Bone Region No depletion  Scapular Bone Region No depletion  Dorsal Hand No depletion  Patellar Region No depletion  Anterior Thigh Region No depletion  Posterior Calf Region No depletion  Edema (RD Assessment) None  Hair Reviewed  Eyes Reviewed  Mouth Reviewed  Skin Reviewed  Nails Reviewed       Diet Order:   Diet Order             Diet regular Room service appropriate? Yes; Fluid consistency: Thin  Diet effective now  EDUCATION NEEDS:   Education needs have been addressed  Skin:  Skin Assessment: Reviewed RN Assessment  Last BM:  09/14/23  Height:   Ht Readings from Last 1 Encounters:  09/08/23 5\' 6"  (1.676 m)    Weight:   Wt Readings from Last 1 Encounters:  09/15/23 61.3 kg    Ideal Body Weight:  59.1 kg  BMI:  Body mass index  is 21.81 kg/m.  Estimated Nutritional Needs:   Kcal:  1800-2000  Protein:  90-105 grams  Fluid:  > 1.8 L    Levada Schilling, RD, LDN, CDCES Registered Dietitian III Certified Diabetes Care and Education Specialist Please refer to [facility] for RD and/or RD on-call/weekend/after hours pager

## 2023-09-16 NOTE — Progress Notes (Signed)
Physical Therapy Treatment Patient Details Name: Sheryl Silva MRN: [id number] DOB: [birth year] Today's Date: 09/16/2023   History of Present Illness Sheryl Silva is a 47 y.o. 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    PT Comments  Pt alert and oriented, reporting 10/10 pain everywhere upon entry and RN notified during session to bring pain meds. Pt agreeable to bed level exercises with education provided regarding importance of maintaining strength even when in bed. Pt tolerated supine therex (see details below), and reported that she felt better afterwards. Pt would continue to benefit from skilled acute therapy to progress toward mobility goals.     If plan is discharge home, recommend the following: Help with stairs or ramp for entrance;Assist for transportation;Assistance with cooking/housework;A lot of help with walking and/or transfers;A little help with bathing/dressing/bathroom   Can travel by private vehicle     No  Equipment Recommendations  Other (comment) (TBD at next venue of care)    Recommendations for Other Services       Precautions / Restrictions Precautions Precautions: Fall Precaution Comments: R biliary drain; R chest port Restrictions Weight Bearing Restrictions: No     Mobility  Bed Mobility               General bed mobility comments: NT- bed level exercises only 2/2 pain    Transfers                         Ambulation/Gait                   Stairs             Wheelchair Mobility     Tilt Bed    Modified Rankin (Stroke Patients Only)       Balance       Sitting balance - Comments: NT- bed level exercises only                                    Cognition Arousal: Alert Behavior During Therapy: WFL for tasks assessed/performed Overall Cognitive Status: Within Functional Limits for tasks assessed                                 General Comments: pleasant and agreeable to bed level exercises        Exercises General Exercises - Lower Extremity Ankle Circles/Pumps: AROM, Strengthening, Supine, Both, 10 reps Quad Sets: 10 reps, Both, Supine, AROM, Strengthening Gluteal Sets: AROM, Strengthening, Supine, Both, 10 reps Heel Slides: AROM, Strengthening, Supine, Both, 10 reps Hip ABduction/ADduction: AROM, Strengthening, Supine, Both, 10 reps    General Comments        Pertinent Vitals/Pain Pain Assessment Pain Assessment: 0-10 Pain Score: 10-Worst pain ever Pain Location: everywhere Pain Descriptors / Indicators: Grimacing, Dull, Sore Pain Intervention(s): Limited activity within patient's tolerance, Monitored during session, Patient requesting pain meds-RN notified    Home  Living                          Prior Function            PT Goals (current goals can now be found in the care plan section) Acute Rehab PT Goals Patient Stated Goal: walk w/o pain, get back to performing adl's like cooking/cleaning PT Goal Formulation: With patient Time For Goal Achievement: 09/27/23 Potential to Achieve Goals: Fair Progress towards PT goals: Progressing toward goals    Frequency    Min 1X/week      PT Plan      Co-evaluation              AM-PAC PT "6 Clicks" Mobility   Outcome Measure  Help needed turning from your back to your side while in a flat bed without using bedrails?: A Little Help needed  moving from lying on your back to sitting on the side of a flat bed without using bedrails?: A Little Help needed moving to and from a bed to a chair (including a wheelchair)?: A Lot Help needed standing up from a chair using your arms (e.g., wheelchair or bedside chair)?: A Lot Help needed to walk in hospital room?: A Lot Help needed climbing 3-5 steps with a railing? : A Lot 6 Click Score: 14    End of Session   Activity Tolerance: Patient limited by pain Patient left: in bed;with family/visitor present;with bed alarm set;with call bell/phone within reach Nurse Communication: Mobility status;Other (comment) (pain status) PT Visit Diagnosis: Muscle weakness (generalized) (M62.81);Pain;Unsteadiness on feet (R26.81);Difficulty in walking, not elsewhere classified (R26.2) Pain - Right/Left:  (everywhere) Pain - part of body: Shoulder;Hip;Knee;Leg     Time: 8295-6213 PT Time Calculation (min) (ACUTE ONLY): 12 min  Charges:    $Therapeutic Exercise: 8-22 mins PT General Charges $$ ACUTE PT VISIT: 1 Visit                      Shauna Hugh, SPT 09/16/2023, 3:14 PM

## 2023-09-16 NOTE — Progress Notes (Signed)
PHARMACIST - PHYSICIAN COMMUNICATION  DR:   Mayford Knife  CONCERNING: IV to Oral Route Change Policy  RECOMMENDATION: This patient is receiving pantoprazole by the intravenous route.  Based on criteria approved by the Pharmacy and Therapeutics Committee, the intravenous medication(s) is/are being converted to the equivalent oral dose form(s).   DESCRIPTION: These criteria include: The patient is eating (either orally or via tube) and/or has been taking other orally administered medications for a least 24 hours The patient has no evidence of active gastrointestinal bleeding or impaired GI absorption (gastrectomy, short bowel, patient on TNA or NPO).  If you have questions about this conversion, please contact the Pharmacy Department  []   [phone] )  [facility] [x]   [phone] )  [facility] []   [phone] )  [facility] []   [phone] )  [facility] []   [phone] )  [facility]   Elliot Gurney, PharmD, BCPS Clinical Pharmacist  09/16/2023 11:58 AM

## 2023-09-16 NOTE — TOC Progression Note (Addendum)
Transition of Care [facility]) - Progression Note    Patient Details  Name: Sheryl Silva MRN: [id number] Date of Birth: 12-04-1975  Transition of Care [facility]) CM/SW Contact  Allena Katz, LCSW Phone Number: 09/16/2023, 1:44 PM  Clinical Narrative:   MD reports pain is not well controlled and wants me to wait until Monday to start authorization for rehab.  CSW met patient at bedside to discuss bed offers. Pt would like to go with either [facility] or [facility] whichever is closest.         Expected Discharge Plan and Services                                               Social Determinants of Health (SDOH) Interventions SDOH Screenings   Food Insecurity: No Food Insecurity (09/09/2023)  Recent Concern: Food Insecurity - Food Insecurity Present (07/22/2023)  Housing: Low Risk  (09/09/2023)  Transportation Needs: No Transportation Needs (09/09/2023)  Recent Concern: Transportation Needs - Unmet Transportation Needs (07/22/2023)  Utilities: Not At Risk (09/09/2023)  Alcohol Screen: Low Risk  (02/15/2023)  Depression (PHQ2-9): Low Risk  (02/14/2023)  Financial Resource Strain: Low Risk  (05/09/2023)   Received from [facility], [facility]  Recent Concern: Financial Resource Strain - Medium Risk (02/15/2023)  Physical Activity: Inactive (02/15/2023)  Social Connections: Socially Isolated (02/15/2023)  Stress: Stress Concern Present (02/15/2023)  Tobacco Use: Medium Risk (09/08/2023)    Readmission Risk Interventions    09/10/2023    1:52 PM 08/22/2023   10:03 PM 08/04/2023    8:47 AM  Readmission Risk Prevention Plan  Transportation Screening Complete Complete   PCP or Specialist Appt within 3-5 Days Complete Complete Complete  HRI or Home Care Consult Complete Complete   Social Work Consult for Recovery Care Planning/Counseling Complete --   Palliative Care Screening Not Applicable Not Applicable   Medication Review Furniture conservator/restorer) Complete Complete

## 2023-09-16 NOTE — Progress Notes (Signed)
Patient consented and enrolled in care on 07/19/23. Initial evaluation scheduled for 07/21/23.

## 2023-09-16 NOTE — Plan of Care (Signed)

## 2023-09-16 NOTE — Progress Notes (Signed)
Palliative Medicine [facility] [phone] [phone]   Name: Sheryl Silva Date: 09/16/2023 MRN: [id number]  DOB: Jan 06, 1976  Patient Care Team: [name], MD as PCP - General (Internal Medicine) Jim Like, RN as Registered Nurse Scarlett Presto, RN (Inactive) as Registered Nurse Benita Gutter, RN as Oncology Nurse Navigator Jeralyn Ruths, MD as Consulting Physician (Oncology) Johnnette Barrios, RN as VBCI Care Management    REASON FOR CONSULTATION: Sheryl Silva is a 47 y.o. 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  Patient recently hospitalized 07/22/2023 to 08/04/2023 with CMV colitis treated with valganciclovir.  She was hospitalized again 08/20/2023 to 08/23/2023 with cholecystitis, which was treated conservatively.  Patient was readmitted 09/08/2023 with abdominal pain and again found to have acute cholecystitis.  Patient underwent cholecystostomy drain placed by IR on 11/8.  She has subsequently had ongoing abdominal pain.  Palliative care was consulted to address goals and manage ongoing symptoms.   CODE STATUS: Full code  PAST MEDICAL HISTORY: Past Medical History:  Diagnosis Date   Anxiety    Cancer (HCC)    Depression    Heart murmur    Hypertension     PAST SURGICAL HISTORY:  Past Surgical History:  Procedure Laterality Date   BIOPSY  07/26/2023   Procedure: BIOPSY;  Surgeon: Toney Reil, MD;  Location: [facility] ENDOSCOPY;  Service: Gastroenterology;;   CESAREAN SECTION     COLONOSCOPY WITH PROPOFOL N/A 02/21/2023   Procedure: COLONOSCOPY WITH PROPOFOL;  Surgeon: Regis Bill, MD;  Location: [facility] ENDOSCOPY;  Service: Endoscopy;  Laterality: N/A;   DILATION AND CURETTAGE OF UTERUS     FLEXIBLE SIGMOIDOSCOPY N/A 07/26/2023   Procedure: FLEXIBLE SIGMOIDOSCOPY;  Surgeon: Toney Reil, MD;  Location:  [facility] ENDOSCOPY;  Service: Gastroenterology;  Laterality: N/A;   IR IMAGING GUIDED PORT INSERTION  02/16/2023   IR PERC CHOLECYSTOSTOMY  09/09/2023    HEMATOLOGY/ONCOLOGY HISTORY:  Oncology History  Signet ring cell adenocarcinoma (HCC)  02/07/2023 Cancer Staging   Staging form: Exocrine Pancreas, AJCC 8th Edition - Clinical stage from 02/07/2023: Stage IV (cTX, cNX, pM1) - Signed by Jeralyn Ruths, MD on 02/14/2023 Stage prefix: Initial diagnosis   02/14/2023 Initial Diagnosis   Signet ring cell adenocarcinoma   03/02/2023 -  Chemotherapy   Patient is on Treatment Plan : GI ORIGIN FOLFOX+KEYTRUDA q21d x12 followed by Gwenlyn Fudge       ALLERGIES:  is allergic to nifedipine.  MEDICATIONS:  Current Facility-Administered Medications  Medication Dose Route Frequency Provider Last Rate Last Admin   acetaminophen (TYLENOL) tablet 650 mg  650 mg Oral Q6H PRN Gertha Calkin, MD   650 mg at 09/15/23 0301   Or   acetaminophen (TYLENOL) suppository 650 mg  650 mg Rectal Q6H PRN Gertha Calkin, MD       albuterol (PROVENTIL) (2.5 MG/3ML) 0.083% nebulizer solution 2.5 mg  2.5 mg Inhalation Q6H PRN Gertha Calkin, MD       ALPRAZolam Prudy Feeler) tablet 0.25 mg  0.25 mg Oral TID PRN Darlin Priestly, MD   0.25 mg at 09/16/23 0640   Chlorhexidine Gluconate Cloth 2 % PADS 6 each  6 each Topical Daily Darlin Priestly, MD   6 each at 09/16/23 1000   DULoxetine (CYMBALTA) DR capsule 60 mg  60 mg Oral Daily Gertha Calkin, MD   60 mg at 09/16/23 1052   enoxaparin (  LOVENOX) injection 40 mg  40 mg Subcutaneous Q24H Darlin Priestly, MD   40 mg at 09/16/23 1052   feeding supplement (ENSURE ENLIVE / ENSURE PLUS) liquid 237 mL  237 mL Oral TID BM Darlin Priestly, MD   237 mL at 09/16/23 1359   fentaNYL (DURAGESIC) 50 MCG/HR 1 patch  1 patch Transdermal Q72H Columbia Pandey, Daryl Eastern, NP   1 patch at 09/14/23 1337   gabapentin (NEURONTIN) capsule 600 mg  600 mg Oral TID Gertha Calkin, MD   600 mg at 09/16/23 1052   hydrocortisone cream 1 %    Topical BID Charise Killian, MD   Given at 09/16/23 1053   HYDROmorphone (DILAUDID) injection 1 mg  1 mg Intravenous Q2H PRN Manuela Schwartz, NP   1 mg at 09/16/23 1400   iodixanol (VISIPAQUE) 320 MG/ML injection 5 mL  5 mL Other Once PRN El-Abd, Aaron Edelman, MD       magnesium hydroxide (MILK OF MAGNESIA) suspension 30 mL  30 mL Oral Daily PRN Darlin Priestly, MD   30 mL at 09/13/23 2112   metoprolol tartrate (LOPRESSOR) tablet 25 mg  25 mg Oral BID Darlin Priestly, MD   25 mg at 09/16/23 1052   ondansetron (ZOFRAN) 8 mg in sodium chloride 0.9 % 50 mL IVPB  8 mg Intravenous Q8H PRN Gertha Calkin, MD   Stopped at 09/14/23 2014   oxyCODONE (Oxy IR/ROXICODONE) immediate release tablet 15 mg  15 mg Oral Q6H PRN Charise Killian, MD       pantoprazole (PROTONIX) EC tablet 40 mg  40 mg Oral BID Cordella Register A, RPH       piperacillin-tazobactam (ZOSYN) IVPB 3.375 g  3.375 g Intravenous Q8H Irena Cords V, MD 12.5 mL/hr at 09/16/23 1051 3.375 g at 09/16/23 1051   prochlorperazine (COMPAZINE) injection 5 mg  5 mg Intravenous Q6H PRN Darlin Priestly, MD   5 mg at 09/16/23 0902   senna (SENOKOT) tablet 8.6 mg  1 tablet Oral Daily Cambreigh Dearing, Daryl Eastern, NP   8.6 mg at 09/16/23 1052   sodium chloride flush (NS) 0.9 % injection 3 mL  3 mL Intravenous Q12H Irena Cords V, MD   3 mL at 09/16/23 1610   sodium chloride flush (NS) 0.9 % injection 5 mL  5 mL Intracatheter Q8H Piscoya, Jose, MD   5 mL at 09/16/23 0908   Facility-Administered Medications Ordered in Other Encounters  Medication Dose Route Frequency Provider Last Rate Last Admin   heparin lock flush 100 unit/mL  500 Units Intravenous Once Aseel Uhde, Daryl Eastern, NP       sodium chloride flush (NS) 0.9 % injection 10 mL  10 mL Intracatheter PRN Jeralyn Ruths, MD   10 mL at 06/29/23 1430    VITAL SIGNS: BP (!) 131/95 (BP Location: Left Arm)   Pulse (!) 109   Temp 98.9 F (37.2 C)   Resp 20   Ht 5\' 6"  (1.676 m)   Wt 135 lb 2.3 oz (61.3 kg)   SpO2 97%   BMI  21.81 kg/m  Filed Weights   09/12/23 0438 09/14/23 0450 09/15/23 0500  Weight: 134 lb 11.2 oz (61.1 kg) 134 lb 14.7 oz (61.2 kg) 135 lb 2.3 oz (61.3 kg)    Estimated body mass index is 21.81 kg/m as calculated from the following:   Height as of this encounter: 5\' 6"  (1.676 m).   Weight as of this encounter: 135 lb 2.3 oz (61.3 kg).  LABS: CBC:    Component Value Date/Time   WBC 6.8 09/16/2023 0746   HGB 8.0 (L) 09/16/2023 0746   HGB 8.1 (L) 09/08/2023 1332   HCT 23.7 (L) 09/16/2023 0746   PLT 71 (L) 09/16/2023 0746   PLT 166 09/08/2023 1332   MCV 88.4 09/16/2023 0746   NEUTROABS 3.6 09/08/2023 1332   LYMPHSABS 0.7 09/08/2023 1332   MONOABS 0.3 09/08/2023 1332   EOSABS 0.0 09/08/2023 1332   BASOSABS 0.0 09/08/2023 1332   Comprehensive Metabolic Panel:    Component Value Date/Time   NA 125 (L) 09/16/2023 0746   K 3.9 09/16/2023 0746   CL 92 (L) 09/16/2023 0746   CO2 22 09/16/2023 0746   BUN 11 09/16/2023 0746   CREATININE 0.38 (L) 09/16/2023 0746   CREATININE 0.50 09/08/2023 1332   GLUCOSE 103 (H) 09/16/2023 0746   CALCIUM 8.1 (L) 09/16/2023 0746   AST 30 09/14/2023 0452   AST 683 (HH) 09/08/2023 1332   ALT 34 09/14/2023 0452   ALT 324 (HH) 09/08/2023 1332   ALKPHOS 516 (H) 09/14/2023 0452   BILITOT 0.9 09/14/2023 0452   BILITOT 1.3 (H) 09/08/2023 1332   PROT 6.5 09/14/2023 0452   ALBUMIN 2.4 (L) 09/14/2023 0452    RADIOGRAPHIC STUDIES: US PELVIC TRANSABD W/PELVIC DOPPLER  Result Date: 09/11/2023 CLINICAL DATA:  Left groin pain. History of stage for metastatic adenocarcinoma of the GI tract. EXAM: TRANSABDOMINAL AND TRANSVAGINAL ULTRASOUND OF PELVIS DOPPLER ULTRASOUND OF OVARIES TECHNIQUE: Both transabdominal and transvaginal ultrasound examinations of the pelvis were performed. Transabdominal technique was performed for global imaging of the pelvis including uterus, ovaries, adnexal regions, and pelvic cul-de-sac. It was necessary to proceed with endovaginal exam  following the transabdominal exam to visualize the uterus and ovaries. Color and duplex Doppler ultrasound was utilized to evaluate blood flow to the ovaries. COMPARISON:  CT AP 09/08/2023 FINDINGS: Uterus Measurements: 8.2 x 4.0 x 5.6 cm = volume: 96 mL. No fibroids or other mass visualized. Endometrium Thickness: 4.1 mm.  No focal abnormality visualized. Right ovary Not visualized. Left ovary Left adnexal mass and left ovary are indistinguishable measuring 5.3 x 5.9 x 5.5 cm. Pulsed Doppler evaluation of both ovaries demonstrates There is decreased color Doppler flow to the left adnexal mass. No arterial waveforms identified within left adnexal mass/left ovary. Normal venous waveforms were noted. Other findings Small volume of free fluid noted within the pelvis. IMPRESSION: 1. Left adnexal mass and left ovary are indistinguishable measuring 5.3 x 5.9 x 5.5 cm. There is decreased color Doppler flow to the left adnexal mass. No arterial waveforms identified within left adnexal mass/left ovary. Normal venous waveforms were noted. Imaging findings are indeterminate but torsion cannot be excluded. 2. Small volume of free fluid noted within the pelvis. 3. Right ovary not visualized. Electronically Signed   By: Signa Kell M.D.   On: 09/11/2023 16:21   IR Perc Cholecystostomy  Result Date: 09/10/2023 INDICATION: Acute cholecystitis EXAM: Placement of percutaneous cholecystostomy tube using ultrasound and fluoroscopic guidance MEDICATIONS: Documented in the EMR ANESTHESIA/SEDATION: Moderate (conscious) sedation was employed during this procedure. A total of Versed 2 mg and Fentanyl 100 mcg was administered intravenously by the radiology nurse. Total intra-service moderate Sedation Time: 18 minutes. The patient's level of consciousness and vital signs were monitored continuously by radiology nursing throughout the procedure under my direct supervision. FLUOROSCOPY: Radiation Exposure Index (as provided by the  fluoroscopic device): 1.5 minutes (8 mGy) COMPLICATIONS: None immediate. PROCEDURE: Informed written consent was obtained  from the patient after a thorough discussion of the procedural risks, benefits and alternatives. All questions were addressed. Maximal Sterile Barrier Technique was utilized including caps, mask, sterile gowns, sterile gloves, sterile drape, hand hygiene and skin antiseptic. A timeout was performed prior to the initiation of the procedure. The patient was placed supine on the exam table. The right upper quadrant was prepped and draped in the standard sterile fashion. Ultrasound of the right upper quadrant was performed for planning purposes. This again demonstrated a distended gallbladder with wall edema and sludge, consistent with acute cholecystitis. An infracostal transhepatic approach was planned. Skin entry site was marked, and local analgesia was obtained with 1% lidocaine. Under ultrasound guidance, percutaneous access was obtained into the gallbladder via an infracostal transhepatic approach using a 21 gauge Chiba needle. Access was confirmed with visualization of needle tip within the gallbladder lumen, and free return of bile. An 018 Nitrex wire was then advanced through the access needle and coiled within the gallbladder lumen. A transition dilator was advanced over this wire, through which an antegrade cholecystogram was performed. Antegrade cholecystogram demonstrated appropriate location in the gallbladder lumen and a distended gallbladder with gallstones. The cystic duct was not visualized. Over an Amplatz wire, the percutaneous tract was serially dilated followed by placement of a 10 French locking multipurpose drainage catheter into the gallbladder lumen. Locking loop was formed. Additional biliary sludge was drained. Gentle hand injection of contrast material confirmed location of the gallbladder lumen. The drainage catheter was secured to the skin using silk suture and a  dressing. It was placed to bag drainage. The patient tolerated the procedure well without immediate complication. IMPRESSION: Successful placement of a 10 French percutaneous cholecystostomy tube. Cholecystostomy tube placed to bag drainage. Additional follow-up recommendations per referring surgical service. Electronically Signed   By: Olive Bass M.D.   On: 09/10/2023 11:02   CT ABDOMEN PELVIS W CONTRAST  Result Date: 09/08/2023 CLINICAL DATA:  Elevated liver enzymes, nausea and diarrhea for 3 days, cholelithiasis with acute cholecystitis on previous imaging, history of metastatic adrenal carcinoma EXAM: CT ABDOMEN AND PELVIS WITH CONTRAST TECHNIQUE: Multidetector CT imaging of the abdomen and pelvis was performed using the standard protocol following bolus administration of intravenous contrast. RADIATION DOSE REDUCTION: This exam was performed according to the departmental dose-optimization program which includes automated exposure control, adjustment of the mA and/or kV according to patient size and/or use of iterative reconstruction technique. CONTRAST:  OMNIPAQUE IOHEXOL 300 MG/ML  SOLN COMPARISON:  08/21/2023, 08/20/2023 FINDINGS: Lower chest: Stable bibasilar hypoventilatory changes and scarring. No acute pleural or parenchymal lung disease. Hepatobiliary: There is a new 1 cm hypodensity within the left lobe liver, corresponding to hypoechoic lesion seen on today's ultrasound. Given history of metastatic adrenal carcinoma, new metastatic lesion is suspected. Multiple gallstones are again identified, with progressive gallbladder wall thickening and intramural edema consistent with acute cholecystitis. No evidence of choledocholithiasis. Pancreas: Unremarkable. No pancreatic ductal dilatation or surrounding inflammatory changes. Spleen: Normal in size without focal abnormality. Adrenals/Urinary Tract: The adrenals are unremarkable. The kidneys enhance normally. There is new mild left-sided  hydronephrosis, with mucosal enhancement at the left UPJ suggesting obstructing mucosal lesion, reference image 43/2. No right-sided obstruction. No urinary tract calculi. The bladder is unremarkable. Stomach/Bowel: No bowel obstruction or ileus. Normal appendix right lower quadrant. No bowel wall thickening or inflammatory change. Vascular/Lymphatic: Stable atherosclerosis of the aorta. There is persistent retroperitoneal lymphadenopathy consistent with metastatic disease. Index left para-aortic lymph node image 44/2 measures 8  mm in short axis, stable. No new adenopathy. Reproductive: 5.1 x 3.9 cm left adnexal mass not appreciably changed since prior exam. Right ovary is unremarkable and stable. Stable uterine fibroid. Other: Trace pelvic free fluid unchanged. No free intraperitoneal gas. No abdominal wall hernia. Musculoskeletal: Diffuse sclerosis throughout the visualized bony structures compatible with bony metastases. No acute or pathologic fracture. Reconstructed images demonstrate no additional findings. IMPRESSION: 1. Cholelithiasis with progressive gallbladder wall thickening and intramural edema consistent with worsening acute cholecystitis. 2. New 1 cm hypodensity within the left lobe liver, concerning for new liver metastasis. This was not evident on recent CT or MRI. 3. Persistent metastatic disease, with diffuse sclerotic bony metastases and retroperitoneal adenopathy unchanged. 4. Stable indeterminate left adnexal mass. 5. New mild left hydronephrosis, with subtle mucosal enhancement within the left ureter at the UPJ. Mucosal lesion cannot be excluded, and urologic consultation may be useful. 6.  Aortic Atherosclerosis (ICD10-I70.0). Electronically Signed   By: Sharlet Salina M.D.   On: 09/08/2023 21:43   US Abdomen Limited RUQ (LIVER/GB)  Result Date: 09/08/2023 CLINICAL DATA:  Quadrant pain for 3 days EXAM: ULTRASOUND ABDOMEN LIMITED RIGHT UPPER QUADRANT COMPARISON:  08/20/2023 FINDINGS:  Gallbladder: Multiple shadowing gallstones are seen filling the gallbladder lumen. Gallbladder wall remains thickened measuring up to 8 mm, with prominent intramural edema now noted. Negative sonographic Murphy sign. Common bile duct: Diameter: 11 mm Liver: Normal liver echotexture. There is a circumscribed 1.3 cm hypoechoic area within the left lobe liver, without associated abnormality on prior CT or MRI. Portal vein is patent on color Doppler imaging with normal direction of blood flow towards the liver. Other: None. IMPRESSION: 1. Cholelithiasis, with worsening gallbladder wall thickening compatible with progressive acute cholecystitis. 2. Stable dilated common bile duct. 3. Indeterminate 1.3 cm hypoechoic area left lobe liver, not clearly seen on previous CT or MRI. Electronically Signed   By: Sharlet Salina M.D.   On: 09/08/2023 20:59   MR ABDOMEN MRCP W WO CONTAST  Result Date: 08/21/2023 CLINICAL DATA:  47 year old female with history of transaminitis and abdominal pain. Gallstones. Evaluate for choledocholithiasis. EXAM: MRI ABDOMEN WITHOUT AND WITH CONTRAST (INCLUDING MRCP) TECHNIQUE: Multiplanar multisequence MR imaging of the abdomen was performed both before and after the administration of intravenous contrast. Heavily T2-weighted images of the biliary and pancreatic ducts were obtained, and three-dimensional MRCP images were rendered by post processing. CONTRAST:  7mL GADAVIST GADOBUTROL 1 MMOL/ML IV SOLN COMPARISON:  No prior abdominal MRI. CT of the abdomen and pelvis 08/20/2023. FINDINGS: Lower chest: Elevation of the right hemidiaphragm. Multiple prominent borderline enlarged juxta diaphragmatic lymph nodes are noted on the right measuring up to 8 mm in short axis (axial image 21 of series 26). Hepatobiliary: Multiple filling defects are noted within the gallbladder, most notably a 1.9 cm filling defect near the neck of the gallbladder, indicative of gallstones. Gallbladder is severely  distended. Gallbladder wall appears thickened and edematous with trace volume of pericholecystic fluid. MRCP images are limited by patient respiratory motion. With this limitation in mind, no definite filling defect within the common bile duct to suggest choledocholithiasis. Common bile duct is dilated measuring up to 11 mm. There is abrupt tapering of the distal common bile duct shortly above the level of the ampulla, suggesting a benign stricture. No intrahepatic biliary ductal dilatation noted at this time. Mild increased T2 signal intensity in a periportal distribution, indicative of periportal edema. No suspicious cystic or solid hepatic lesions. Pancreas: No pancreatic mass. No pancreatic ductal  dilatation. No pancreatic or peripancreatic fluid collections or inflammatory changes. Spleen:  Unremarkable. Adrenals/Urinary Tract: Bilateral kidneys and adrenal glands are normal in appearance. No hydroureteronephrosis in the visualized portions of the abdomen. Stomach/Bowel: Visualized portions are unremarkable. Vascular/Lymphatic: Atherosclerosis of the abdominal aorta. No aneurysm identified in the visualized abdominal vasculature. No lymphadenopathy noted in the abdomen. Other: No significant volume of ascites noted in the visualized portions of the peritoneal cavity. Musculoskeletal: Innumerable small areas of heterogeneous enhancement noted throughout the visualized axial skeleton, indicative of widespread metastatic disease to the bones. Multiple prominent borderline enlarged enhancing lymph nodes in the lower left axial region partially imaged. IMPRESSION: 1. Cholelithiasis with evidence of acute cholecystitis. Surgical consultation is recommended. 2. No choledocholithiasis. 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 This is associated with common bile duct dilatation, but no frank intrahepatic biliary ductal dilatation is noted at this time. 3. Diffuse periportal edema  in the liver. 4. Borderline enlarged juxta diaphragmatic lymph nodes and left axillary lymph nodes, likely metastatic. Widespread metastatic disease to the bones redemonstrated. Electronically Signed   By: Trudie Reed M.D.   On: 08/21/2023 07:41   MR 3D Recon At Scanner  Result Date: 08/21/2023 CLINICAL DATA:  47 year old female with history of transaminitis and abdominal pain. Gallstones. Evaluate for choledocholithiasis. EXAM: MRI ABDOMEN WITHOUT AND WITH CONTRAST (INCLUDING MRCP) TECHNIQUE: Multiplanar multisequence MR imaging of the abdomen was performed both before and after the administration of intravenous contrast. Heavily T2-weighted images of the biliary and pancreatic ducts were obtained, and three-dimensional MRCP images were rendered by post processing. CONTRAST:  7mL GADAVIST GADOBUTROL 1 MMOL/ML IV SOLN COMPARISON:  No prior abdominal MRI. CT of the abdomen and pelvis 08/20/2023. FINDINGS: Lower chest: Elevation of the right hemidiaphragm. Multiple prominent borderline enlarged juxta diaphragmatic lymph nodes are noted on the right measuring up to 8 mm in short axis (axial image 21 of series 26). Hepatobiliary: Multiple filling defects are noted within the gallbladder, most notably a 1.9 cm filling defect near the neck of the gallbladder, indicative of gallstones. Gallbladder is severely distended. Gallbladder wall appears thickened and edematous with trace volume of pericholecystic fluid. MRCP images are limited by patient respiratory motion. With this limitation in mind, no definite filling defect within the common bile duct to suggest choledocholithiasis. Common bile duct is dilated measuring up to 11 mm. There is abrupt tapering of the distal common bile duct shortly above the level of the ampulla, suggesting a benign stricture. No intrahepatic biliary ductal dilatation noted at this time. Mild increased T2 signal intensity in a periportal distribution, indicative of periportal edema. No  suspicious cystic or solid hepatic lesions. Pancreas: No pancreatic mass. No pancreatic ductal dilatation. No pancreatic or peripancreatic fluid collections or inflammatory changes. Spleen:  Unremarkable. Adrenals/Urinary Tract: Bilateral kidneys and adrenal glands are normal in appearance. No hydroureteronephrosis in the visualized portions of the abdomen. Stomach/Bowel: Visualized portions are unremarkable. Vascular/Lymphatic: Atherosclerosis of the abdominal aorta. No aneurysm identified in the visualized abdominal vasculature. No lymphadenopathy noted in the abdomen. Other: No significant volume of ascites noted in the visualized portions of the peritoneal cavity. Musculoskeletal: Innumerable small areas of heterogeneous enhancement noted throughout the visualized axial skeleton, indicative of widespread metastatic disease to the bones. Multiple prominent borderline enlarged enhancing lymph nodes in the lower left axial region partially imaged. IMPRESSION: 1. Cholelithiasis with evidence of acute cholecystitis. Surgical consultation is recommended. 2. No choledocholithiasis. There appears to be a benign stricture of  the distal common bile duct, as above. This is associated with common bile duct dilatation, but no frank intrahepatic biliary ductal dilatation is noted at this time. 3. Diffuse periportal edema in the liver. 4. Borderline enlarged juxta diaphragmatic lymph nodes and left axillary lymph nodes, likely metastatic. Widespread metastatic disease to the bones redemonstrated. Electronically Signed   By: Trudie Reed M.D.   On: 08/21/2023 07:41   US Venous Img Lower Unilateral Left  Result Date: 08/21/2023 CLINICAL DATA:  Left lower extremity pain.  Abnormal CT. EXAM: LEFT LOWER EXTREMITY VENOUS DOPPLER ULTRASOUND TECHNIQUE: Gray-scale sonography with compression, as well as color and duplex ultrasound, were performed to evaluate the deep venous system(s) from the level of the common femoral vein  through the popliteal and proximal calf veins. COMPARISON:  None Available. FINDINGS: VENOUS Normal compressibility of the common femoral, superficial femoral, and popliteal veins, as well as the visualized calf veins. Visualized portions of profunda femoral vein and great saphenous vein unremarkable. No filling defects to suggest DVT on grayscale or color Doppler imaging. Doppler waveforms show normal direction of venous flow, normal respiratory plasticity and response to augmentation. Limited views of the contralateral common femoral vein are unremarkable. OTHER None. Limitations: none IMPRESSION: No evidence of left lower extremity DVT. Electronically Signed   By: Narda Rutherford M.D.   On: 08/21/2023 00:08   US ABDOMEN LIMITED RUQ (LIVER/GB)  Result Date: 08/21/2023 CLINICAL DATA:  Abdominal pain. EXAM: ULTRASOUND ABDOMEN LIMITED RIGHT UPPER QUADRANT COMPARISON:  CT earlier today FINDINGS: Gallbladder: Moderately distended containing multiple intraluminal stones and sludge. Edematous gallbladder wall thickening at 5 mm. No sonographic Murphy sign noted by sonographer. Common bile duct: Diameter: 9 mm.  No visualized choledocholithiasis. Liver: There is central intrahepatic biliary ductal dilatation. No focal lesion identified. Within normal limits in parenchymal echogenicity. Portal vein is patent on color Doppler imaging with normal direction of blood flow towards the liver. Other: No definite pericholecystic fluid by ultrasound. IMPRESSION: 1. Sonographic findings suspicious for acute cholecystitis. Gallstones and sludge with edematous gallbladder wall thickening. 2. Dilated common bile duct with intrahepatic biliary ductal dilatation. No visualized choledocholithiasis. MRCP could be performed for further assessment of the biliary tree as clinically indicated. Electronically Signed   By: Narda Rutherford M.D.   On: 08/21/2023 00:07   CT Angio Chest PE W and/or Wo Contrast  Result Date:  08/20/2023 CLINICAL DATA:  Pulmonary embolus suspected with high probability. Chest pain in the cancer patient. EXAM: CT ANGIOGRAPHY CHEST WITH CONTRAST TECHNIQUE: Multidetector CT imaging of the chest was performed using the standard protocol during bolus administration of intravenous contrast. Multiplanar CT image reconstructions and MIPs were obtained to evaluate the vascular anatomy. RADIATION DOSE REDUCTION: This exam was performed according to the departmental dose-optimization program which includes automated exposure control, adjustment of the mA and/or kV according to patient size and/or use of iterative reconstruction technique. CONTRAST:  OMNIPAQUE IOHEXOL 350 MG/ML SOLN COMPARISON:  CT chest 02/03/2023.  PET-CT 07/06/2023 FINDINGS: Cardiovascular: Technically adequate study with good opacification of the central and segmental pulmonary arteries and mild motion artifact. No focal filling defects are demonstrated. No evidence of significant pulmonary embolus. Mild cardiac enlargement. No pericardial effusions. Normal caliber thoracic aorta. No aortic dissection. 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 Esophagus is decompressed. Thyroid gland is unremarkable. Prominent lymphadenopathy in the left axilla and left supraclavicular region. Left axillary lymph nodes measure up to about 2.3 cm short axis dimension. Lymph nodes are similar in  size to prior PET-CT. No significant mediastinal lymphadenopathy. Lungs/Pleura: Motion artifact limits examination. There is patchy airspace disease throughout both lungs. This could indicate multifocal pneumonia or edema. No pleural effusions. No pneumothorax. Upper Abdomen: Enlarged lymph nodes in the cardiophrenic angle. No acute abnormalities demonstrated in the visualized upper abdomen. 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 Review of the  MIP images confirms the above findings. IMPRESSION: 1. No evidence of significant pulmonary embolus. 2. Patchy airspace disease throughout both lungs likely representing edema or multifocal pneumonia. 3. Metastatic lymphadenopathy in the left axilla and supraclavicular region. 4. Diffuse sclerotic skeletal metastasis. Electronically Signed   By: Burman Nieves M.D.   On: 08/20/2023 21:42   CT ABDOMEN PELVIS W CONTRAST  Result Date: 08/20/2023 CLINICAL DATA:  Abdominal pain, acute, nonlocalized. Current cancer patient. Pain in abdomen. EXAM: CT ABDOMEN AND PELVIS WITH CONTRAST TECHNIQUE: Multidetector CT imaging of the abdomen and pelvis was performed using the standard protocol following bolus administration of intravenous contrast. RADIATION DOSE REDUCTION: This exam was performed according to the departmental dose-optimization program which includes automated exposure control, adjustment of the mA and/or kV according to patient size and/or use of iterative reconstruction technique. CONTRAST:  OMNIPAQUE IOHEXOL 350 MG/ML SOLN COMPARISON:  Ultrasound pelvis 07/22/2023, CT abdomen pelvis 07/22/2023, pet ct 07/06/23 FINDINGS: Lower chest: Please see separately dictated CT angiography chest 08/20/2023 Hepatobiliary: No focal liver abnormality. Multiple calcified gallstones within the gallbladder lumen. Question gallbladder wall thickening and pericholecystic fluid. No biliary dilatation. Pancreas: No focal lesion. Normal pancreatic contour. No surrounding inflammatory changes. No main pancreatic ductal dilatation. Spleen: Normal in size without focal abnormality. Adrenals/Urinary Tract: No adrenal nodule bilaterally. Bilateral kidneys enhance symmetrically. No hydronephrosis. No hydroureter. The urinary bladder is unremarkable. Stomach/Bowel: Stomach is within normal limits. No evidence of bowel wall thickening or dilatation. Appendix appears normal. Vascular/Lymphatic: Asymmetrically hypodense left femoral  vein and external iliac vein compared to the right with interval increase in size compared to prior. No abdominal aorta or iliac aneurysm. Severe atherosclerotic plaque of the aorta and its branches. Persistent prominent but nonenlarged retroperitoneal and mesenteric lymph nodes. No abdominal, pelvic, or inguinal lymphadenopathy. Reproductive: Uterine fibroid (8:83). Right adnexa is unremarkable. Similar-appearing heterogeneous hypodense 5.4 x 4.6 cm left adnexal/ovarian mass. Other: Trace volume rectouterine pouch free fluid. No intraperitoneal free gas. No organized fluid collection. Musculoskeletal: No abdominal wall hernia or abnormality. Redemonstrate of diffuse axial and appendicular sclerotic metastases. No acute displaced fracture. Multilevel degenerative changes of the spine. IMPRESSION: 1. Cholelithiasis with question gallbladder wall thickening and pericholecystic fluid. Recommend right upper quadrant ultrasound for further evaluation. 2. Asymmetrically hypodense left femoral vein and external iliac vein compared to the right with interval increase in size compared to prior. Query underlying thrombus. Consider ultrasound if clinically indicated. 3. Similar-appearing heterogeneous hypodense 5.4 x 4.6 cm left adnexal/ovarian mass. 4. Trace volume rectouterine pouch free fluid. 5. Persistent diffuse axial and appendicular sclerotic metastases. 6. Uterine fibroid. 7. Please see separately dictated CT angiography chest 08/20/2023. Electronically Signed   By: Tish Frederickson M.D.   On: 08/20/2023 21:42   DG Chest 2 View  Result Date: 08/20/2023 CLINICAL DATA:  Chest pain. EXAM: CHEST - 2 VIEW COMPARISON:  07/22/2023. FINDINGS: Bilateral lung fields are clear. Bilateral costophrenic angles are clear. Normal cardio-mediastinal silhouette. No acute osseous abnormalities. Redemonstration of extensive sclerotic metastases throughout the imaged bones. No pathological fracture seen. The soft tissues are within  normal limits. Right-sided CT Port-A-Cath  is seen with its tip overlying the cavoatrial junction region. IMPRESSION: 1. No active cardiopulmonary disease. 2. Diffuse osseous metastases. Electronically Signed   By: Jules Schick M.D.   On: 08/20/2023 15:27    PERFORMANCE STATUS (ECOG) : 2 - Symptomatic, <50% confined to bed  Review of Systems Unless otherwise noted, a complete review of systems is negative.  Physical Exam General: NAD Cardiovascular: regular rate and rhythm Pulmonary: clear ant fields Abdomen: soft, nontender, + bowel sounds GU: no suprapubic tenderness Extremities: no edema, no joint deformities Skin: no rashes Neurological: Weakness but otherwise nonfocal  IMPRESSION: Follow-up visit.  Patient reports significant amount of pain last night.  Better controlled today.  Overall, in past 24 hours, patient has received a total of 10 mg IV hydromorphone.  Patient has also received a total of 20 mg oxycodone.  Will liberalize dosing of fentanyl with hope that this lessens her need for such frequent utilization of hydromorphone and oxycodone.  I would prefer RN to administer oxycodone and utilize hydromorphone only as backup for severe and unrelieved pain.  PLAN: -Continue current scope of treatment -Increase transdermal fentanyl 75 mcg every 72 hours -Oxycodone as needed for breakthrough pain -Will leave as needed hydromorphone as backup but preference should be given to oxycodone for BTP -Daily bowel regimen   Time Total: 20 minutes  Visit consisted of counseling and education dealing with the complex and emotionally intense issues of symptom management and palliative care in the setting of serious and potentially life-threatening illness.Greater than 50%  of this time was spent counseling and coordinating care related to the above assessment and plan.  Signed by: Laurette Schimke, PhD, NP-C

## 2023-09-17 DIAGNOSIS — C801 Malignant (primary) neoplasm, unspecified: Secondary | ICD-10-CM | POA: Diagnosis not present

## 2023-09-17 LAB — BASIC METABOLIC PANEL
Anion gap: 11 (ref 5–15)
BUN: 11 mg/dL (ref 6–20)
CO2: 24 mmol/L (ref 22–32)
Calcium: 8.1 mg/dL — ABNORMAL LOW (ref 8.9–10.3)
Chloride: 91 mmol/L — ABNORMAL LOW (ref 98–111)
Creatinine, Ser: 0.53 mg/dL (ref 0.44–1.00)
GFR, Estimated: >60 mL/min (ref >60)
Glucose, Bld: 110 mg/dL — ABNORMAL HIGH (ref 70–99)
Potassium: 4 mmol/L (ref 3.5–5.1)
Sodium: 126 mmol/L — ABNORMAL LOW (ref 135–145)

## 2023-09-17 LAB — CBC
HCT: 23.5 % — ABNORMAL LOW (ref 36.0–46.0)
Hemoglobin: 7.6 g/dL — ABNORMAL LOW (ref 12.0–15.0)
MCH: 29.6 pg (ref 26.0–34.0)
MCHC: 32.3 g/dL (ref 30.0–36.0)
MCV: 91.4 fL (ref 80.0–100.0)
Platelets: 72 10*3/uL — ABNORMAL LOW (ref 150–400)
RBC: 2.57 MIL/uL — ABNORMAL LOW (ref 3.87–5.11)
RDW: 17.3 % — ABNORMAL HIGH (ref 11.5–15.5)
WBC: 6.9 10*3/uL (ref 4.0–10.5)
nRBC: 2.9 % — ABNORMAL HIGH (ref 0.0–0.2)

## 2023-09-17 MED ORDER — SODIUM CHLORIDE 0.9% FLUSH: 10.0000 mL | INTRAVENOUS | Status: DC | PRN

## 2023-09-17 MED ORDER — SODIUM CHLORIDE 0.9% FLUSH
10.0000 mL | Freq: Two times a day (BID) | INTRAVENOUS | Status: DC
  Administered 2023-09-18 – 2023-10-03 (×29): 10 mL

## 2023-09-17 NOTE — Plan of Care (Signed)
This patient has had complaints of abdominal and generalize pain all day. She has received IV and PO prn medication. Continues to rate her pain 7/10. Encouraged the patient to get out of bed to the chair today when she was ready. She has great family support from her husband.   Problem: Education: Goal: Knowledge of General Education information will improve Description: Including pain rating scale, medication(s)/side effects and non-pharmacologic comfort measures Outcome: Progressing   Problem: Clinical Measurements: Goal: Ability to maintain clinical measurements within normal limits will improve Outcome: Progressing Goal: Will remain free from infection Outcome: Progressing Goal: Diagnostic test results will improve Outcome: Progressing Goal: Respiratory complications will improve Outcome: Progressing Goal: Cardiovascular complication will be avoided Outcome: Progressing   Problem: Activity: Goal: Risk for activity intolerance will decrease Outcome: Progressing   Problem: Nutrition: Goal: Adequate nutrition will be maintained Outcome: Progressing   Problem: Coping: Goal: Level of anxiety will decrease Outcome: Progressing   Problem: Elimination: Goal: Will not experience complications related to bowel motility Outcome: Progressing Goal: Will not experience complications related to urinary retention Outcome: Progressing   Problem: Pain Management: Goal: General experience of comfort will improve Outcome: Progressing   Problem: Safety: Goal: Ability to remain free from injury will improve Outcome: Progressing   Problem: Skin Integrity: Goal: Risk for impaired skin integrity will decrease Outcome: Progressing

## 2023-09-17 NOTE — Progress Notes (Signed)
PROGRESS NOTE    Sheryl Silva  GNF:[id number] DOB: Aug 28, 1976 DOA: 09/08/2023 PCP: [name], MD    Assessment & Plan:   Principal Problem:   Nausea and vomiting Active Problems:   Hypertension   Signet ring cell adenocarcinoma (HCC)   Acute cholecystitis   Transaminitis   Metastatic signet ring cell carcinoma (HCC)   Pelvic mass   Generalized abdominal pain  Assessment and Plan: Acute cholecystitis: s/p cholecystostomy drain placed by IR on 09/09/23. Continue on IV zosyn. Apparently cultures were not sent so they were ordered next day but nothing resulted so far but WBC is WNL and no fevers    Left groin pain: likely due to ovarian mass. PCA dilaudid pump was d/c. US pelvic with doppler, which showed known left ovary mass with reduced blood flow. Continue on fentanyl patch, gabapentin, oxycodone, dilaudid prn. Palliative care following and recs apprec. Gyn onco recs apprec    Stage IV metastatic signet ring adenocarcinoma: continue w/ pain management. Currently not receiving treatment. 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  Most recent CMV DNA from 09/08/23 was neg. Saw ID Dr. Rivka Safer on 09/08/23, and rec holding ganciclovir while not on chemo.  But if she plans to start chemo she will have to be on suppressive therapy with 900mg  Valganciclovir every day.   Normocytic anemia: s/p 1 unit of pRBCs transfused so far. Will transfuse if Hb < 7.0    Hyponatremia: labile. Present since August 2024. Will continue to monitor    Constipation: resolved          DVT prophylaxis: lovenox  Code Status: full  Family Communication: discussed pt's care w/ pt' brother, Iantha Fallen, and answered his questions  Disposition Plan: likely d/c to SNF   Level of care: Med-Surg Status is: Inpatient Remains inpatient appropriate because: severity of illness    Consultants:  Gyn onco  Palliative care   Procedures:  Antimicrobials: zosyn     Subjective: Pt has improved pain today   Objective: Vitals:   09/17/23 0233 09/17/23 0500 09/17/23 0539 09/17/23 0836  BP: (!) 140/94  117/88 125/88  Pulse: (!) 116  (!) 105   Resp:    16  Temp: 98.7 F (37.1 C)  98.7 F (37.1 C) 98.7 F (37.1 C)  TempSrc: Oral  Oral   SpO2: 95%  95% 98%  Weight:  61 kg    Height:        Intake/Output Summary (Last 24 hours) at 09/17/2023 0848 Last data filed at 09/17/2023 0229 Gross per 24 hour  Intake 350 ml  Output 200 ml  Net 150 ml   Filed Weights   09/14/23 0450 09/15/23 0500 09/17/23 0500  Weight: 61.2 kg 61.3 kg 61 kg    Examination:  General exam: appears comfortable  Respiratory system: clear breath sounds b/l  Cardiovascular system: S1/S2+. No rubs or clicks  Gastrointestinal system: Abd is soft, tenderness to palpation & normal bowel sounds  Central nervous system: alert & awake. Moves all extremities  Psychiatry: judgement and insight appears at baseline. Flat mood and affect    Data Reviewed: I have personally reviewed following labs and imaging studies  CBC: Recent Labs  Lab 09/13/23 0437 09/14/23 0452 09/15/23 0812 09/16/23 0746 09/17/23 0451  WBC 5.0 5.8 6.3 6.8 6.9  HGB 8.5* 8.1* 7.9* 8.0* 7.6*  HCT 25.6* 24.9* 23.5* 23.7* 23.5*  MCV 89.5 91.2 88.3 88.4 91.4  PLT 97* 82* 75* 71* 72*  Basic Metabolic Panel: Recent Labs  Lab 09/11/23 0600 09/12/23 0440 09/13/23 0437 09/14/23 0452 09/15/23 0812 09/16/23 0746 09/17/23 0451  NA 127* 129* 126* 129* 125* 125* 126*  K 3.7 3.8 3.9 4.1 3.6 3.9 4.0  CL 102 102 98 93* 93* 92* 91*  CO2 20* 21* 22 22 24 22 24   GLUCOSE 89 94 91 92 101* 103* 110*  BUN 10 9 8 9 11 11 11   CREATININE 0.49 0.44 0.47 0.39* 0.43* 0.38* 0.53  CALCIUM 8.2* 8.3* 8.1* 8.5* 8.2* 8.1* 8.1*  MG 1.7 1.8 1.9 1.8  --   --   --    GFR: Estimated Creatinine Clearance: 80.5 mL/min (by C-G formula based on SCr of 0.53 mg/dL). Liver Function Tests: Recent Labs  Lab 09/11/23 0600  09/12/23 0440 09/13/23 0437 09/14/23 0452  AST 37 31 30 30   ALT 84* 59* 43 34  ALKPHOS 833* 654* 609* 516*  BILITOT 0.8 0.6 1.0 0.9  PROT 6.7 6.2* 6.4* 6.5  ALBUMIN 2.7* 2.4* 2.6* 2.4*   No results for input(s): "LIPASE", "AMYLASE" in the last 168 hours.  No results for input(s): "AMMONIA" in the last 168 hours. Coagulation Profile: No results for input(s): "INR", "PROTIME" in the last 168 hours.  Cardiac Enzymes: No results for input(s): "CKTOTAL", "CKMB", "CKMBINDEX", "TROPONINI" in the last 168 hours. BNP (last 3 results) No results for input(s): "PROBNP" in the last 8760 hours. HbA1C: No results for input(s): "HGBA1C" in the last 72 hours. CBG: Recent Labs  Lab 09/14/23 0804  GLUCAP 84   Lipid Profile: No results for input(s): "CHOL", "HDL", "LDLCALC", "TRIG", "CHOLHDL", "LDLDIRECT" in the last 72 hours. Thyroid Function Tests: No results for input(s): "TSH", "T4TOTAL", "FREET4", "T3FREE", "THYROIDAB" in the last 72 hours. Anemia Panel: No results for input(s): "VITAMINB12", "FOLATE", "FERRITIN", "TIBC", "IRON", "RETICCTPCT" in the last 72 hours. Sepsis Labs: No results for input(s): "PROCALCITON", "LATICACIDVEN" in the last 168 hours.   Recent Results (from the past 240 hour(s))  Blood culture (routine x 2)     Status: None   Collection Time: 09/08/23  7:48 PM   Specimen: BLOOD  Result Value Ref Range Status   Specimen Description BLOOD BLOOD RIGHT ARM  Final   Special Requests   Final    BOTTLES DRAWN AEROBIC AND ANAEROBIC Blood Culture adequate volume   Culture   Final    NO GROWTH 5 DAYS Performed at [facility], [address], [place], [place] [address]    Report Status 09/13/2023 FINAL  Final  Blood culture (routine x 2)     Status: None   Collection Time: 09/08/23  7:48 PM   Specimen: BLOOD  Result Value Ref Range Status   Specimen Description BLOOD BLOOD LEFT HAND  Final   Special Requests   Final    BOTTLES DRAWN AEROBIC AND ANAEROBIC  Blood Culture adequate volume   Culture   Final    NO GROWTH 5 DAYS Performed at [facility], [address] [place], [place] [address]    Report Status 09/13/2023 FINAL  Final         Radiology Studies: No results found.      Scheduled Meds:  Chlorhexidine Gluconate Cloth  6 each Topical Daily   DULoxetine  60 mg Oral Daily   enoxaparin (LOVENOX) injection  40 mg Subcutaneous Q24H   feeding supplement  237 mL Oral TID BM   fentaNYL  1 patch Transdermal Q72H   gabapentin  600 mg Oral TID  hydrocortisone cream   Topical BID   metoprolol tartrate  25 mg Oral BID   multivitamin with minerals  1 tablet Oral Daily   pantoprazole  40 mg Oral BID   senna  1 tablet Oral Daily   sodium chloride flush  3 mL Intravenous Q12H   sodium chloride flush  5 mL Intracatheter Q8H   Continuous Infusions:  ondansetron (ZOFRAN) IV Stopped (09/14/23 2014)   piperacillin-tazobactam (ZOSYN)  IV 3.375 g (09/17/23 0229)     LOS: 8 days        Charise Killian, MD [facility] Pager [phone]  If 7PM-7AM, please contact night-coverage [email or web address] 09/17/2023, 8:48 AM

## 2023-09-17 NOTE — Plan of Care (Signed)

## 2023-09-17 NOTE — Progress Notes (Signed)
Patient c/o of abdomenal/back pain throughout night. RN has utilized PRN pain medication orders to help control patient's pain (See MAR). RN has educated patient and family throughout shift regarding alternative pain therapy such as ice, heat, repositioning. Patient still requests pain medication over alternative therapy. RN will continue to educate patient and family regarding pain management

## 2023-09-17 NOTE — Progress Notes (Signed)
Port-a-cath to be re-accessed by charge RN, Lupita Leash.

## 2023-09-18 ENCOUNTER — Inpatient Hospital Stay: Payer: 59

## 2023-09-18 DIAGNOSIS — R0602 Shortness of breath: Secondary | ICD-10-CM | POA: Diagnosis not present

## 2023-09-18 DIAGNOSIS — C801 Malignant (primary) neoplasm, unspecified: Secondary | ICD-10-CM | POA: Diagnosis not present

## 2023-09-18 LAB — CBC
HCT: 22.5 % — ABNORMAL LOW (ref 36.0–46.0)
Hemoglobin: 7.3 g/dL — ABNORMAL LOW (ref 12.0–15.0)
MCH: 29.7 pg (ref 26.0–34.0)
MCHC: 32.4 g/dL (ref 30.0–36.0)
MCV: 91.5 fL (ref 80.0–100.0)
Platelets: 64 10*3/uL — ABNORMAL LOW (ref 150–400)
RBC: 2.46 MIL/uL — ABNORMAL LOW (ref 3.87–5.11)
RDW: 17.5 % — ABNORMAL HIGH (ref 11.5–15.5)
WBC: 7 10*3/uL (ref 4.0–10.5)
nRBC: 1.9 % — ABNORMAL HIGH (ref 0.0–0.2)

## 2023-09-18 LAB — BASIC METABOLIC PANEL
Anion gap: 9 (ref 5–15)
BUN: 11 mg/dL (ref 6–20)
CO2: 23 mmol/L (ref 22–32)
Calcium: 8.2 mg/dL — ABNORMAL LOW (ref 8.9–10.3)
Chloride: 91 mmol/L — ABNORMAL LOW (ref 98–111)
Creatinine, Ser: <0.3 mg/dL — ABNORMAL LOW (ref 0.44–1.00)
Glucose, Bld: 102 mg/dL — ABNORMAL HIGH (ref 70–99)
Potassium: 4.2 mmol/L (ref 3.5–5.1)
Sodium: 123 mmol/L — ABNORMAL LOW (ref 135–145)

## 2023-09-18 LAB — SODIUM
Sodium: 122 mmol/L — ABNORMAL LOW (ref 135–145)
Sodium: 119 mmol/L — CL (ref 135–145)

## 2023-09-18 MED ORDER — HYDROMORPHONE HCL 2 MG PO TABS
2.0000 mg | ORAL_TABLET | ORAL | Status: DC | PRN
  Administered 2023-09-18 – 2023-09-23 (×30): 2 mg via ORAL
  Filled 2023-09-18 (×31): qty 1

## 2023-09-18 MED ORDER — SODIUM CHLORIDE 0.9 % IV SOLN: INTRAVENOUS | Status: DC

## 2023-09-18 MED ORDER — OXYCODONE HCL 5 MG PO TABS
15.0000 mg | ORAL_TABLET | Freq: Four times a day (QID) | ORAL | Status: DC | PRN
  Administered 2023-09-18 – 2023-09-23 (×16): 15 mg via ORAL
  Filled 2023-09-18 (×16): qty 3

## 2023-09-18 MED ORDER — SODIUM CHLORIDE 1 G PO TABS
1.0000 g | ORAL_TABLET | Freq: Two times a day (BID) | ORAL | Status: DC
  Administered 2023-09-18 – 2023-09-20 (×4): 1 g via ORAL
  Filled 2023-09-18 (×5): qty 1

## 2023-09-18 MED ORDER — OXYCODONE HCL 5 MG PO TABS
15.0000 mg | ORAL_TABLET | Freq: Four times a day (QID) | ORAL | Status: DC | PRN
  Administered 2023-09-18: 15 mg via ORAL
  Filled 2023-09-18 (×2): qty 3

## 2023-09-18 NOTE — Plan of Care (Signed)

## 2023-09-18 NOTE — Progress Notes (Signed)
PROGRESS NOTE    Sheryl Silva  UJW:[id number] DOB: [birth year] DOA: 09/08/2023 PCP: [name], MD    Assessment & Plan:   Principal Problem:   Nausea and vomiting Active Problems:   Hypertension   Signet ring cell adenocarcinoma (HCC)   Acute cholecystitis   Transaminitis   Metastatic signet ring cell carcinoma (HCC)   Pelvic mass   Generalized abdominal pain  Assessment and Plan: Acute cholecystitis: s/p cholecystostomy drain placed by IR on 09/09/23. Continue on IV zosyn. Apparently cultures were not sent so they were ordered next day but nothing resulted so far but WBC is WNL and no fevers    Left groin pain: likely due to ovarian mass. PCA dilaudid pump was d/c. US pelvic with doppler, which showed known left ovary mass with reduced blood flow. Continue on fentanyl patch, gabapentin, oxycodone, dilaudid prn. Palliative care following and recs apprec. Gyn onco recs apprec    Stage IV metastatic signet ring adenocarcinoma: continue w/ pain management. Currently not receiving treatment. Management per gyn onco outpatient   Dyspnea: w/ exertion. Etiology unclear, tachycardia vs pneumonia. CXR shows right base consolidation or volume loss & possible small right sided diffuse osteoblastic metastatic disease. Pt is already on IV zosyn. No fevers. Normal WBC. 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  Most recent CMV DNA from 09/08/23 was neg. Saw ID Dr. Rivka Safer on 09/08/23, and rec holding ganciclovir while not on chemo.  But if she plans to start chemo she will have to be on suppressive therapy with 900mg  Valganciclovir every day.   Normocytic anemia: s/p 1 unit of pRBCs transfused so far. Will transfuse if Hb < 7.0    Hyponatremia: labile. Present since August 2024. Will continue to monitor    Constipation: resolved          DVT prophylaxis: lovenox  Code Status: full  Family Communication: discussed pt's care w/ pt' brother, Iantha Fallen, and  answered his questions  Disposition Plan: likely d/c to SNF   Level of care: Med-Surg Status is: Inpatient Remains inpatient appropriate because: severity of illness    Consultants:  Gyn onco  Palliative care   Procedures:  Antimicrobials: zosyn    Subjective: Pt pain but slightly improved   Objective: Vitals:   09/18/23 0000 09/18/23 0211 09/18/23 0410 09/18/23 0500  BP: (!) 146/93 (!) 129/95 136/81   Pulse: (!) 118 (!) 117 (!) 103   Resp: 20     Temp: 98.2 F (36.8 C) 98.1 F (36.7 C) 98.7 F (37.1 C)   TempSrc:  Oral Oral   SpO2: 99% 99% 99%   Weight:    60.9 kg  Height:        Intake/Output Summary (Last 24 hours) at 09/18/2023 0805 Last data filed at 09/18/2023 0400 Gross per 24 hour  Intake 240 ml  Output 150 ml  Net 90 ml   Filed Weights   09/15/23 0500 09/17/23 0500 09/18/23 0500  Weight: 61.3 kg 61 kg 60.9 kg    Examination:  General exam: appears comfortable  Respiratory system: clear breath sounds b/l Cardiovascular system: S1 & S2+. No rubs or clicks Gastrointestinal system: abd is soft, NT, ND & normal bowel sounds  Central nervous system: alert & awake. Moves all extremities  Psychiatry: judgement and insight appears at baseline.flat mood and affect    Data Reviewed: I have personally reviewed following labs and imaging studies  CBC: Recent Labs  Lab 09/14/23 0452 09/15/23 0812 09/16/23 0746  09/17/23 0451 09/18/23 0604  WBC 5.8 6.3 6.8 6.9 7.0  HGB 8.1* 7.9* 8.0* 7.6* 7.3*  HCT 24.9* 23.5* 23.7* 23.5* 22.5*  MCV 91.2 88.3 88.4 91.4 91.5  PLT 82* 75* 71* 72* 64*   Basic Metabolic Panel: Recent Labs  Lab 09/12/23 0440 09/13/23 0437 09/14/23 0452 09/15/23 0812 09/16/23 0746 09/17/23 0451 09/18/23 0604  NA 129* 126* 129* 125* 125* 126* 123*  K 3.8 3.9 4.1 3.6 3.9 4.0 4.2  CL 102 98 93* 93* 92* 91* 91*  CO2 21* 22 22 24 22 24 23   GLUCOSE 94 91 92 101* 103* 110* 102*  BUN 9 8 9 11 11 11 11   CREATININE 0.44 0.47 0.39*  0.43* 0.38* 0.53 <0.30*  CALCIUM 8.3* 8.1* 8.5* 8.2* 8.1* 8.1* 8.2*  MG 1.8 1.9 1.8  --   --   --   --    GFR: CrCl cannot be calculated (This lab value cannot be used to calculate CrCl because it is not a number: <0.30). Liver Function Tests: Recent Labs  Lab 09/12/23 0440 09/13/23 0437 09/14/23 0452  AST 31 30 30   ALT 59* 43 34  ALKPHOS 654* 609* 516*  BILITOT 0.6 1.0 0.9  PROT 6.2* 6.4* 6.5  ALBUMIN 2.4* 2.6* 2.4*   No results for input(s): "LIPASE", "AMYLASE" in the last 168 hours.  No results for input(s): "AMMONIA" in the last 168 hours. Coagulation Profile: No results for input(s): "INR", "PROTIME" in the last 168 hours.  Cardiac Enzymes: No results for input(s): "CKTOTAL", "CKMB", "CKMBINDEX", "TROPONINI" in the last 168 hours. BNP (last 3 results) No results for input(s): "PROBNP" in the last 8760 hours. HbA1C: No results for input(s): "HGBA1C" in the last 72 hours. CBG: Recent Labs  Lab 09/14/23 0804  GLUCAP 84   Lipid Profile: No results for input(s): "CHOL", "HDL", "LDLCALC", "TRIG", "CHOLHDL", "LDLDIRECT" in the last 72 hours. Thyroid Function Tests: No results for input(s): "TSH", "T4TOTAL", "FREET4", "T3FREE", "THYROIDAB" in the last 72 hours. Anemia Panel: No results for input(s): "VITAMINB12", "FOLATE", "FERRITIN", "TIBC", "IRON", "RETICCTPCT" in the last 72 hours. Sepsis Labs: No results for input(s): "PROCALCITON", "LATICACIDVEN" in the last 168 hours.   Recent Results (from the past 240 hour(s))  Blood culture (routine x 2)     Status: None   Collection Time: 09/08/23  7:48 PM   Specimen: BLOOD  Result Value Ref Range Status   Specimen Description BLOOD BLOOD RIGHT ARM  Final   Special Requests   Final    BOTTLES DRAWN AEROBIC AND ANAEROBIC Blood Culture adequate volume   Culture   Final    NO GROWTH 5 DAYS Performed at [facility], [address] [place], [place] [address]    Report Status 09/13/2023 FINAL  Final  Blood  culture (routine x 2)     Status: None   Collection Time: 09/08/23  7:48 PM   Specimen: BLOOD  Result Value Ref Range Status   Specimen Description BLOOD BLOOD LEFT HAND  Final   Special Requests   Final    BOTTLES DRAWN AEROBIC AND ANAEROBIC Blood Culture adequate volume   Culture   Final    NO GROWTH 5 DAYS Performed at [facility], [address] [place], [place] [address]    Report Status 09/13/2023 FINAL  Final         Radiology Studies: No results found.      Scheduled Meds:  Chlorhexidine Gluconate Cloth  6 each Topical Daily   DULoxetine  60 mg Oral Daily   enoxaparin (LOVENOX) injection  40 mg Subcutaneous Q24H   feeding supplement  237 mL Oral TID BM   fentaNYL  1 patch Transdermal Q72H   gabapentin  600 mg Oral TID   hydrocortisone cream   Topical BID   metoprolol tartrate  25 mg Oral BID   multivitamin with minerals  1 tablet Oral Daily   pantoprazole  40 mg Oral BID   senna  1 tablet Oral Daily   sodium chloride flush  10-40 mL Intracatheter Q12H   sodium chloride flush  3 mL Intravenous Q12H   sodium chloride flush  5 mL Intracatheter Q8H   Continuous Infusions:  sodium chloride     ondansetron (ZOFRAN) IV Stopped (09/14/23 2014)   piperacillin-tazobactam (ZOSYN)  IV 3.375 g (09/18/23 0228)     LOS: 9 days        Charise Killian, MD [facility] Pager [phone]  If 7PM-7AM, please contact night-coverage [email or web address] 09/18/2023, 8:05 AM

## 2023-09-18 NOTE — Plan of Care (Signed)

## 2023-09-18 NOTE — Progress Notes (Signed)
MEWS Progress Note  Patient Details Name: Sheryl Silva MRN: [id number] DOB: February 13, 1976 Today's Date: 09/18/2023   MEWS Flowsheet Documentation:  Assess: MEWS Score Temp: 98.7 F (37.1 C) BP: (!) 149/90 MAP (mmHg): 106 Pulse Rate: (!) 114 ECG Heart Rate: (!) 116 Resp: 18 Level of Consciousness: Alert SpO2: 100 % O2 Device: Room Air Patient Activity (if Appropriate): In bed O2 Flow Rate (L/min): 2 L/min FiO2 (%): (!) 0 % Assess: MEWS Score MEWS Temp: 0 MEWS Systolic: 0 MEWS Pulse: 2 MEWS RR: 0 MEWS LOC: 0 MEWS Score: 2 MEWS Score Color: Yellow Assess: SIRS CRITERIA SIRS Temperature : 0 SIRS Respirations : 0 SIRS Pulse: 1 SIRS WBC: 0 SIRS Score Sum : 1 SIRS Temperature : 0 SIRS Pulse: 1 SIRS Respirations : 0 SIRS WBC: 0 SIRS Score Sum : 1 Assess: if the MEWS score is Yellow or Red Were vital signs accurate and taken at a resting state?: Yes Does the patient meet 2 or more of the SIRS criteria?: No Does the patient have a confirmed or suspected source of infection?: No MEWS guidelines implemented : Yes, yellow Treat MEWS Interventions: Considered administering scheduled or prn medications/treatments as ordered Take Vital Signs Increase Vital Sign Frequency : Yellow: Q2hr x1, continue Q4hrs until patient remains green for 12hrs Escalate MEWS: Escalate: Yellow: Discuss with charge nurse and consider notifying provider and/or RRT        [name] 09/18/2023, 11:56 AM

## 2023-09-19 ENCOUNTER — Ambulatory Visit: Payer: 59 | Admitting: Oncology

## 2023-09-19 ENCOUNTER — Ambulatory Visit: Payer: 59

## 2023-09-19 ENCOUNTER — Other Ambulatory Visit: Payer: 59

## 2023-09-19 DIAGNOSIS — E871 Hypo-osmolality and hyponatremia: Secondary | ICD-10-CM

## 2023-09-19 DIAGNOSIS — R0602 Shortness of breath: Secondary | ICD-10-CM | POA: Diagnosis not present

## 2023-09-19 DIAGNOSIS — C801 Malignant (primary) neoplasm, unspecified: Secondary | ICD-10-CM | POA: Diagnosis not present

## 2023-09-19 LAB — CBC
HCT: 21.8 % — ABNORMAL LOW (ref 36.0–46.0)
Hemoglobin: 7.3 g/dL — ABNORMAL LOW (ref 12.0–15.0)
MCH: 29.6 pg (ref 26.0–34.0)
MCHC: 33.5 g/dL (ref 30.0–36.0)
MCV: 88.3 fL (ref 80.0–100.0)
Platelets: 65 10*3/uL — ABNORMAL LOW (ref 150–400)
RBC: 2.47 MIL/uL — ABNORMAL LOW (ref 3.87–5.11)
RDW: 17.6 % — ABNORMAL HIGH (ref 11.5–15.5)
WBC: 6.1 10*3/uL (ref 4.0–10.5)
nRBC: 3.9 % — ABNORMAL HIGH (ref 0.0–0.2)

## 2023-09-19 LAB — SODIUM, URINE, RANDOM: Sodium, Ur: 127 mmol/L

## 2023-09-19 LAB — BASIC METABOLIC PANEL
Anion gap: 9 (ref 5–15)
BUN: 10 mg/dL (ref 6–20)
CO2: 21 mmol/L — ABNORMAL LOW (ref 22–32)
Calcium: 7.6 mg/dL — ABNORMAL LOW (ref 8.9–10.3)
Chloride: 89 mmol/L — ABNORMAL LOW (ref 98–111)
Creatinine, Ser: 0.48 mg/dL (ref 0.44–1.00)
GFR, Estimated: >60 mL/min (ref >60)
Glucose, Bld: 96 mg/dL (ref 70–99)
Potassium: 4 mmol/L (ref 3.5–5.1)
Sodium: 119 mmol/L — CL (ref 135–145)

## 2023-09-19 LAB — SODIUM
Sodium: 120 mmol/L — ABNORMAL LOW (ref 135–145)
Sodium: 123 mmol/L — ABNORMAL LOW (ref 135–145)
Sodium: 124 mmol/L — ABNORMAL LOW (ref 135–145)
Sodium: 123 mmol/L — ABNORMAL LOW (ref 135–145)
Sodium: 123 mmol/L — ABNORMAL LOW (ref 135–145)

## 2023-09-19 LAB — MRSA NEXT GEN BY PCR, NASAL: MRSA by PCR Next Gen: NOT DETECTED

## 2023-09-19 LAB — GLUCOSE, CAPILLARY: Glucose-Capillary: 86 mg/dL (ref 70–99)

## 2023-09-19 MED ORDER — SODIUM CHLORIDE 0.9 % IV SOLN: INTRAVENOUS | Status: DC

## 2023-09-19 MED ORDER — SODIUM CHLORIDE 3 % IV SOLN
INTRAVENOUS | Status: DC
  Filled 2023-09-19 (×3): qty 500

## 2023-09-19 MED ORDER — ORAL CARE MOUTH RINSE: 15.0000 mL | OROMUCOSAL | Status: DC | PRN

## 2023-09-19 NOTE — Progress Notes (Signed)
MEDICATION RELATED CONSULT NOTE   Pharmacy Consult for hypertonic saline Indication: hyponatremia   Allergies  Allergen Reactions   Nifedipine Palpitations    Patient Measurements: Height: 5\' 6"  (167.6 cm) Weight: 60.9 kg (134 lb 4.2 oz) IBW/kg (Calculated) : 59.3  Vital Signs: Temp: 99.3 F (37.4 C) (11/18 0821) Temp Source: Oral (11/18 0821) BP: 127/93 (11/18 0821) Pulse Rate: 109 (11/18 0821) Intake/Output from previous day: 11/17 0701 - 11/18 0700 In: 1156.3 [P.O.:540; I.V.:82.7; IV Piggyback:520.7] Out: 170 [Drains:170] Intake/Output from this shift: Total I/O In: -  Out: 50 [Drains:50]  Labs: Recent Labs    09/17/23 0451 09/18/23 0604 09/19/23 0445  WBC 6.9 7.0 6.1  HGB 7.6* 7.3* 7.3*  HCT 23.5* 22.5* 21.8*  PLT 72* 64* 65*  CREATININE 0.53 <0.30* 0.48   Estimated Creatinine Clearance: 80.5 mL/min (by C-G formula based on SCr of 0.48 mg/dL).   Microbiology: Recent Results (from the past 720 hour(s))  SARS Coronavirus 2 by RT PCR (hospital order, performed in [facility] hospital lab) *cepheid single result test* Anterior Nasal Swab     Status: None   Collection Time: 08/20/23  7:15 PM   Specimen: Anterior Nasal Swab  Result Value Ref Range Status   SARS Coronavirus 2 by RT PCR NEGATIVE NEGATIVE Final    Comment: (NOTE) SARS-CoV-2 target nucleic acids are NOT DETECTED.  The SARS-CoV-2 RNA is generally detectable in upper and lower respiratory specimens during the acute phase of infection. The lowest concentration of SARS-CoV-2 viral copies this assay can detect is 250 copies / mL. A negative result does not preclude SARS-CoV-2 infection and should not be used as the sole basis for treatment or other patient management decisions.  A negative result may occur with improper specimen collection / handling, submission of specimen other than nasopharyngeal swab, presence of viral mutation(s) within the areas targeted by this assay, and inadequate number  of viral copies (<250 copies / mL). A negative result must be combined with clinical observations, patient history, and epidemiological information.  Fact Sheet for Patients:   RoadLapTop.co.za  Fact Sheet for Healthcare Providers: http://kim-miller.com/  This test is not yet approved or  cleared by the [place] FDA and has been authorized for detection and/or diagnosis of SARS-CoV-2 by FDA under an Emergency Use Authorization (EUA).  This EUA will remain in effect (meaning this test can be used) for the duration of the COVID-19 declaration under Section 564(b)(1) of the Act, 21 U.S.C. section 360bbb-3(b)(1), unless the authorization is terminated or revoked sooner.  Performed at [facility], [address] [place], [place] [address]   Blood Culture (routine x 2)     Status: None   Collection Time: 08/21/23 12:21 AM   Specimen: BLOOD  Result Value Ref Range Status   Specimen Description BLOOD BLOOD LEFT ARM  Final   Special Requests   Final    BOTTLES DRAWN AEROBIC AND ANAEROBIC Blood Culture adequate volume   Culture   Final    NO GROWTH 5 DAYS Performed at [facility], [address], [place], [place] [address]    Report Status 08/26/2023 FINAL  Final  Blood Culture (routine x 2)     Status: None   Collection Time: 08/21/23  2:38 AM   Specimen: BLOOD  Result Value Ref Range Status   Specimen Description BLOOD BLOOD RIGHT ARM  Final   Special Requests   Final    BOTTLES DRAWN AEROBIC AND ANAEROBIC Blood Culture adequate volume  Culture   Final    NO GROWTH 5 DAYS Performed at [facility], [address] [place] [place], [place] [address]    Report Status 08/26/2023 FINAL  Final  Blood culture (routine x 2)     Status: None   Collection Time: 09/08/23  7:48 PM   Specimen: BLOOD  Result Value Ref Range Status   Specimen Description BLOOD BLOOD RIGHT ARM  Final   Special Requests   Final     BOTTLES DRAWN AEROBIC AND ANAEROBIC Blood Culture adequate volume   Culture   Final    NO GROWTH 5 DAYS Performed at [facility], [address], [place], [place] [address]    Report Status 09/13/2023 FINAL  Final  Blood culture (routine x 2)     Status: None   Collection Time: 09/08/23  7:48 PM   Specimen: BLOOD  Result Value Ref Range Status   Specimen Description BLOOD BLOOD LEFT HAND  Final   Special Requests   Final    BOTTLES DRAWN AEROBIC AND ANAEROBIC Blood Culture adequate volume   Culture   Final    NO GROWTH 5 DAYS Performed at [facility], [address], [place], [place] [address]    Report Status 09/13/2023 FINAL  Final    Medical History: Past Medical History:  Diagnosis Date   Anxiety    Cancer (HCC)    Depression    Heart murmur    Hypertension     Medications:  Sodium chloride tablets  Assessment: 47 year old female admitted with acute cholecystitis and hyponatremia - worsening despite salt tabs. Nephrology consulted.   Goal of Therapy: Increase Na by 4-6 mEq/L in 4-6 hours  Plan:  Hypertonic saline started at 62mL/hr.  Increase Na by 4-6 mEq/L in 4-6 hours Call RN to stop infusion and notify MD if Na increases by 4 mEq/L or more in the first 2 hours or Na increases by 6 mEq/L or more in the first 4 hours   Elliot Gurney, PharmD, BCPS Clinical Pharmacist  09/19/2023 9:01 AM

## 2023-09-19 NOTE — Plan of Care (Signed)
  Problem: Education: Goal: Knowledge of General Education information will improve Description: Including pain rating scale, medication(s)/side effects and non-pharmacologic comfort measures Outcome: Progressing   Problem: Health Behavior/Discharge Planning: Goal: Ability to manage health-related needs will improve Outcome: Progressing   Problem: Clinical Measurements: Goal: Ability to maintain clinical measurements within normal limits will improve Outcome: Progressing Goal: Respiratory complications will improve Outcome: Progressing Goal: Cardiovascular complication will be avoided Outcome: Progressing   Problem: Activity: Goal: Risk for activity intolerance will decrease Outcome: Progressing   Problem: Coping: Goal: Level of anxiety will decrease Outcome: Progressing   Problem: Elimination: Goal: Will not experience complications related to bowel motility Outcome: Progressing Goal: Will not experience complications related to urinary retention Outcome: Progressing

## 2023-09-19 NOTE — Progress Notes (Signed)
MEDICATION RELATED CONSULT NOTE   Pharmacy Consult for 3% NaCl Indication: Hyponatremia   Patient Measurements: Height: 5\' 6"  (167.6 cm) Weight: 69.6 kg (153 lb 7 oz) IBW/kg (Calculated) : 59.3  Intake/Output from previous day: 11/17 0701 - 11/18 0700 In: 1156.3 [P.O.:540; I.V.:82.7; IV Piggyback:520.7] Out: 170 [Drains:170] Intake/Output from this shift: Total I/O In: 133.9 [I.V.:94.8; IV Piggyback:39.2] Out: 50 [Drains:50]  Labs: Recent Labs    09/17/23 0451 09/18/23 0604 09/19/23 0445  WBC 6.9 7.0 6.1  HGB 7.6* 7.3* 7.3*  HCT 23.5* 22.5* 21.8*  PLT 72* 64* 65*  CREATININE 0.53 <0.30* 0.48   Estimated Creatinine Clearance: 80.5 mL/min (by C-G formula based on SCr of 0.48 mg/dL).  Medical History: Past Medical History:  Diagnosis Date   Anxiety    Cancer (HCC)    Depression    Heart murmur    Hypertension     Medications:  1.8L fluid restriction NaCl 1 g BIDM 3% NaCl at 30 cc/hr  Assessment: 47 year old female admitted with acute cholecystitis and hyponatremia - worsening despite salt tabs. Nephrology consulted.   1118 0919 Na 120 1118 0948 3% NaCl started at 30 cc/hr 1118 1114 Na 123 1118 1418 Na 124  Goal of Therapy: Increase Na by 4-6 mmol/L in 4-6 hours, 8 mmol/L in 24h  Plan:  Continue hypertonic saline at 30 cc/hr. Na has increased ~ 4 mmol in 4 hours Continue Na checks q4h while on hypertonic saline  Tressie Ellis 09/19/2023 2:55 PM

## 2023-09-19 NOTE — Plan of Care (Signed)

## 2023-09-19 NOTE — Progress Notes (Signed)
       CROSS COVER NOTE  NAME: Sheryl Silva MRN: [id number] DOB : 1976-10-17    Date of Service   09/19/2023   HPI/Events of Note   Nurse paged for critical sodium of 119 on recent chemistry lab. Chart review showed that patient has been hyponatremic since presentation but values have been trending lower.  Interventions   Orders placed for serum osmolality, urine osmolality, and urine sodium to try to ascertain the type of hyponatremia.  Will follow-up on results and treat accordingly.       Jahir Halt Lamin Geradine Girt, MSN, APRN, AGACNP-BC [facility] [place] Pager: [phone]. Check Amion for Availability

## 2023-09-19 NOTE — Progress Notes (Signed)
PROGRESS NOTE    Sheryl Silva  WJX:[id number] DOB: 05/05/1976 DOA: 09/08/2023 PCP: [name], MD    Assessment & Plan:   Principal Problem:   Nausea and vomiting Active Problems:   Hypertension   Signet ring cell adenocarcinoma (HCC)   Acute cholecystitis   Transaminitis   Metastatic signet ring cell carcinoma (HCC)   Pelvic mass   Generalized abdominal pain  Assessment and Plan: Acute cholecystitis: s/p cholecystostomy drain placed by IR on 09/09/23. Continue on IV zosyn. Apparently cultures were not sent so they were ordered next day but nothing resulted so far but WBC is WNL and no fevers    Left groin pain: likely due to ovarian mass. PCA dilaudid pump was d/c. US pelvic with doppler, which showed known left ovary mass with reduced blood flow.  Continue on fentanyl patch, gabapentin, oxycodone, dilaudid prn. Palliative care following and recs apprec. Gyn onco recs apprec   Stage IV metastatic signet ring adenocarcinoma: continue w/ pain management. Not currently receiving treatment. Management per gyn onco   Dyspnea: w/ exertion. Etiology unclear, tachycardia vs pneumonia. CXR shows right base consolidation or volume loss & possible small right sided diffuse osteoblastic metastatic disease. Continue on IV zosyn. No fevers. WBC is WNL. 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  Most recent CMV DNA from 09/08/23 was neg. Saw ID Dr. Rivka Safer on 09/08/23, and rec holding ganciclovir while not on chemo.  But if she plans to start chemo she will have to be on suppressive therapy with 900mg  Valganciclovir every day.   Normocytic anemia: s/p 1 unit of pRBC transfused so far. Will transfuse if Hb < 7.0.    Hyponatremia: trending down consistently so nephro consulted. Started on 3% and transferred to stepdown. Continue on NaCl tabs and started fluid restriction. Present since August 2024. Monitor sodium levels q4hrs.   Constipation: resolved          DVT  prophylaxis: lovenox  Code Status: full  Family Communication: called pt's brother, Iantha Fallen, no answer so I left a voicemail  Disposition Plan: likely d/c to SNF   Level of care: Med-Surg Status is: Inpatient Remains inpatient appropriate because: severity of illness    Consultants:  Gyn onco  Palliative care  Nephro   Procedures:  Antimicrobials: zosyn    Subjective: Pt c/o malaise   Objective: Vitals:   09/18/23 1756 09/18/23 1930 09/18/23 2330 09/19/23 0230  BP: 105/77 (!) 153/94 (!) 151/92 (!) 147/90  Pulse: (!) 117 (!) 121 (!) 110 (!) 111  Resp: 18 18 18 18   Temp: 99 F (37.2 C) (!) 100.8 F (38.2 C) (!) 100.7 F (38.2 C) 98.6 F (37 C)  TempSrc:   Oral Oral  SpO2: 100% 97% 96% 98%  Weight:      Height:        Intake/Output Summary (Last 24 hours) at 09/19/2023 0808 Last data filed at 09/19/2023 0456 Gross per 24 hour  Intake 1156.33 ml  Output 170 ml  Net 986.33 ml   Filed Weights   09/15/23 0500 09/17/23 0500 09/18/23 0500  Weight: 61.3 kg 61 kg 60.9 kg    Examination:  General exam: appears calm & comfortable  Respiratory system: decreased breath sounds b/l  Cardiovascular system: S1/S2+. No rubs or clicks  Gastrointestinal system: abd is soft, NT, ND & normal bowel sounds  Central nervous system: alert & awake. Moves all extremities  Psychiatry: judgement and insight appears at baseline. Flat mood and affect  Data Reviewed: I have personally reviewed following labs and imaging studies  CBC: Recent Labs  Lab 09/15/23 0812 09/16/23 0746 09/17/23 0451 09/18/23 0604 09/19/23 0445  WBC 6.3 6.8 6.9 7.0 6.1  HGB 7.9* 8.0* 7.6* 7.3* 7.3*  HCT 23.5* 23.7* 23.5* 22.5* 21.8*  MCV 88.3 88.4 91.4 91.5 88.3  PLT 75* 71* 72* 64* 65*   Basic Metabolic Panel: Recent Labs  Lab 09/13/23 0437 09/14/23 0452 09/15/23 0812 09/16/23 0746 09/17/23 0451 09/18/23 0604 09/18/23 1501 09/18/23 2247 09/19/23 0445  NA 126* 129* 125* 125* 126*  123* 122* 119* 119*  K 3.9 4.1 3.6 3.9 4.0 4.2  --   --  4.0  CL 98 93* 93* 92* 91* 91*  --   --  89*  CO2 22 22 24 22 24 23   --   --  21*  GLUCOSE 91 92 101* 103* 110* 102*  --   --  96  BUN 8 9 11 11 11 11   --   --  10  CREATININE 0.47 0.39* 0.43* 0.38* 0.53 <0.30*  --   --  0.48  CALCIUM 8.1* 8.5* 8.2* 8.1* 8.1* 8.2*  --   --  7.6*  MG 1.9 1.8  --   --   --   --   --   --   --    GFR: Estimated Creatinine Clearance: 80.5 mL/min (by C-G formula based on SCr of 0.48 mg/dL). Liver Function Tests: Recent Labs  Lab 09/13/23 0437 09/14/23 0452  AST 30 30  ALT 43 34  ALKPHOS 609* 516*  BILITOT 1.0 0.9  PROT 6.4* 6.5  ALBUMIN 2.6* 2.4*   No results for input(s): "LIPASE", "AMYLASE" in the last 168 hours.  No results for input(s): "AMMONIA" in the last 168 hours. Coagulation Profile: No results for input(s): "INR", "PROTIME" in the last 168 hours.  Cardiac Enzymes: No results for input(s): "CKTOTAL", "CKMB", "CKMBINDEX", "TROPONINI" in the last 168 hours. BNP (last 3 results) No results for input(s): "PROBNP" in the last 8760 hours. HbA1C: No results for input(s): "HGBA1C" in the last 72 hours. CBG: Recent Labs  Lab 09/14/23 0804  GLUCAP 84   Lipid Profile: No results for input(s): "CHOL", "HDL", "LDLCALC", "TRIG", "CHOLHDL", "LDLDIRECT" in the last 72 hours. Thyroid Function Tests: No results for input(s): "TSH", "T4TOTAL", "FREET4", "T3FREE", "THYROIDAB" in the last 72 hours. Anemia Panel: No results for input(s): "VITAMINB12", "FOLATE", "FERRITIN", "TIBC", "IRON", "RETICCTPCT" in the last 72 hours. Sepsis Labs: No results for input(s): "PROCALCITON", "LATICACIDVEN" in the last 168 hours.   No results found for this or any previous visit (from the past 240 hour(s)).        Radiology Studies: DG Chest Port 1 View  Result Date: 09/18/2023 CLINICAL DATA:  Metastatic adenocarcinoma, dyspnea. EXAM: PORTABLE CHEST 1 VIEW COMPARISON:  08/20/2023. FINDINGS:  Unremarkable cardiac silhouette. Right base consolidation or volume loss. Right-sided Port-A-Cath tip distal SVC. No pneumothorax. There may be small right-sided pleural effusion. Diffuse osteoblastic metastatic disease. IMPRESSION: Right base consolidation or volume loss. Possible small right-sided diffuse osteoblastic metastatic disease. Electronically Signed   By: Layla Maw M.D.   On: 09/18/2023 10:32        Scheduled Meds:  Chlorhexidine Gluconate Cloth  6 each Topical Daily   DULoxetine  60 mg Oral Daily   enoxaparin (LOVENOX) injection  40 mg Subcutaneous Q24H   feeding supplement  237 mL Oral TID BM   fentaNYL  1 patch Transdermal Q72H   gabapentin  600 mg Oral TID   hydrocortisone cream   Topical BID   metoprolol tartrate  25 mg Oral BID   multivitamin with minerals  1 tablet Oral Daily   pantoprazole  40 mg Oral BID   senna  1 tablet Oral Daily   sodium chloride flush  10-40 mL Intracatheter Q12H   sodium chloride flush  3 mL Intravenous Q12H   sodium chloride flush  5 mL Intracatheter Q8H   sodium chloride  1 g Oral BID WC   Continuous Infusions:  ondansetron (ZOFRAN) IV Stopped (09/14/23 2014)   piperacillin-tazobactam (ZOSYN)  IV 12.5 mL/hr at 09/19/23 0456     LOS: 10 days        Charise Killian, MD [facility] Pager [phone]  If 7PM-7AM, please contact night-coverage [email or web address] 09/19/2023, 8:08 AM

## 2023-09-19 NOTE — Progress Notes (Signed)
PT Cancellation Note  Patient Details Name: Sheryl Silva MRN: [id number] DOB: 05/02/1976   Cancelled Treatment:    Reason Eval/Treat Not Completed: Other (comment) Pt transferred to ICU for medical complications, no order to continue therapy at transfer. PT will sign off at this time, please re-consult when pt is appropriate for therapy intervention.   Shauna Hugh 09/19/2023, 12:52 PM

## 2023-09-20 DIAGNOSIS — E871 Hypo-osmolality and hyponatremia: Secondary | ICD-10-CM | POA: Diagnosis not present

## 2023-09-20 DIAGNOSIS — R0602 Shortness of breath: Secondary | ICD-10-CM | POA: Diagnosis not present

## 2023-09-20 DIAGNOSIS — C801 Malignant (primary) neoplasm, unspecified: Secondary | ICD-10-CM | POA: Diagnosis not present

## 2023-09-20 LAB — BASIC METABOLIC PANEL
Anion gap: 7 (ref 5–15)
BUN: 10 mg/dL (ref 6–20)
CO2: 20 mmol/L — ABNORMAL LOW (ref 22–32)
Calcium: 7.7 mg/dL — ABNORMAL LOW (ref 8.9–10.3)
Chloride: 95 mmol/L — ABNORMAL LOW (ref 98–111)
Creatinine, Ser: 0.48 mg/dL (ref 0.44–1.00)
GFR, Estimated: >60 mL/min (ref >60)
Glucose, Bld: 98 mg/dL (ref 70–99)
Potassium: 3.8 mmol/L (ref 3.5–5.1)
Sodium: 122 mmol/L — ABNORMAL LOW (ref 135–145)

## 2023-09-20 LAB — CBC
HCT: 20.4 % — ABNORMAL LOW (ref 36.0–46.0)
Hemoglobin: 6.8 g/dL — ABNORMAL LOW (ref 12.0–15.0)
MCH: 30.2 pg (ref 26.0–34.0)
MCHC: 33.3 g/dL (ref 30.0–36.0)
MCV: 90.7 fL (ref 80.0–100.0)
Platelets: 66 10*3/uL — ABNORMAL LOW (ref 150–400)
RBC: 2.25 MIL/uL — ABNORMAL LOW (ref 3.87–5.11)
RDW: 17.6 % — ABNORMAL HIGH (ref 11.5–15.5)
WBC: 5.2 10*3/uL (ref 4.0–10.5)
nRBC: 4.8 % — ABNORMAL HIGH (ref 0.0–0.2)

## 2023-09-20 LAB — SODIUM
Sodium: 126 mmol/L — ABNORMAL LOW (ref 135–145)
Sodium: 128 mmol/L — ABNORMAL LOW (ref 135–145)
Sodium: 125 mmol/L — ABNORMAL LOW (ref 135–145)
Sodium: 125 mmol/L — ABNORMAL LOW (ref 135–145)
Sodium: 125 mmol/L — ABNORMAL LOW (ref 135–145)

## 2023-09-20 LAB — HEMOGLOBIN AND HEMATOCRIT, BLOOD
HCT: 25.4 % — ABNORMAL LOW (ref 36.0–46.0)
Hemoglobin: 8.5 g/dL — ABNORMAL LOW (ref 12.0–15.0)

## 2023-09-20 LAB — OSMOLALITY: Osmolality: 251 mosm/kg — ABNORMAL LOW (ref 275–295)

## 2023-09-20 LAB — PREPARE RBC (CROSSMATCH)

## 2023-09-20 LAB — OSMOLALITY, URINE: Osmolality, Ur: 663 mosm/kg (ref 300–900)

## 2023-09-20 MED ORDER — FUROSEMIDE 20 MG PO TABS
20.0000 mg | ORAL_TABLET | Freq: Every day | ORAL | Status: DC
  Administered 2023-09-20 – 2023-10-04 (×15): 20 mg via ORAL
  Filled 2023-09-20 (×16): qty 1

## 2023-09-20 MED ORDER — SODIUM CHLORIDE 0.9% IV SOLUTION: Freq: Once | INTRAVENOUS | Status: DC

## 2023-09-20 MED ORDER — SODIUM CHLORIDE 0.9% IV SOLUTION: Freq: Once | INTRAVENOUS | Status: AC

## 2023-09-20 MED ORDER — SODIUM CHLORIDE 1 G PO TABS
1.0000 g | ORAL_TABLET | Freq: Three times a day (TID) | ORAL | Status: DC
  Administered 2023-09-20 – 2023-10-04 (×39): 1 g via ORAL
  Filled 2023-09-20 (×40): qty 1

## 2023-09-20 NOTE — Progress Notes (Signed)
OT Cancellation Note  Patient Details Name: Mylena Bleck MRN: [id number] DOB: 19-Oct-1976   Cancelled Treatment:    Reason Eval/Treat Not Completed: Other (comment). Chart reviewed - pt has transitioned to higher level of care. Per therapy protocols, will require new orders or continue at transfer orders to initiate therapy services. Will sign off and await new orders as pt appropriate.   Kathie Dike, M.S. OTR/L  09/20/23, 1:06 PM  ascom [phone]

## 2023-09-20 NOTE — Plan of Care (Signed)

## 2023-09-20 NOTE — Progress Notes (Signed)
PROGRESS NOTE   HPI was taken from Dr. Irena Cords:  Sheryl Silva is a 47 y.o. 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 In chart review, per ID note patient has metastatic signet ring adenocarcinoma was on FOLFOX and Keytruda and received 7 cycles and palliative radiation therapy.  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 If pt is not chemo then she may not need valganciclovir. Otherwise she will need suppressive therapy.   As per Dr. Mayford Knife 11/13-11/14/24: Pt has significant pain over entire body that now seems to be better controlled on current pain regimen. Also, pt has required 2 units of pRBCs for Hb < 7.0 which has improved. Furthermore, pt had to be transferred to stepdown for 3% for hyponatremia of 119. Nephro was consulted and pt is currently on NaCl tabs and fluid restriction as per nephro. See nephro notes for more info. PT/OT recs SNF. Hopefully pt can d/c to SNF in 24-48 hrs.    Camilla Hendrixson  BMW:[id number] DOB: 09-17-1976 DOA: 09/08/2023 PCP: [name], MD    Assessment & Plan:   Principal Problem:   Nausea and vomiting Active Problems:   Hypertension   Signet ring cell adenocarcinoma (HCC)   Acute cholecystitis   Transaminitis   Metastatic signet ring cell carcinoma (HCC)   Pelvic mass   Generalized abdominal  pain  Assessment and Plan: Acute cholecystitis: s/p cholecystostomy drain placed by IR on 09/09/23. Continue on IV zosyn x 14 days. Apparently cultures were not sent so they were ordered next day but nothing resulted so far but WBC is WNL and no fevers    Left groin pain: likely due to ovarian mass. PCA dilaudid pump was d/c. US pelvic with doppler, which showed known left ovary mass with reduced blood flow.  Continue on fentanyl patch, gabapentin, oxycodone, dilaudid prn. Palliative care following and recs apprec. Gyn onco recs apprec   Stage IV metastatic signet ring adenocarcinoma: continue w/ pain management. Not currently receiving treatment. Management per gyn onco   Dyspnea: w/ exertion. Etiology unclear, tachycardia vs pneumonia. CXR shows right base consolidation or volume loss & possible small right sided diffuse osteoblastic metastatic disease. Continue on IV zosyn x 14 days. No fevers. WBC is WNL. 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  Most recent CMV DNA from 09/08/23 was neg. Saw ID Dr. Rivka Safer on 09/08/23, and rec holding ganciclovir while not on chemo.  But if she plans to start chemo she will have to be on suppressive therapy with 900mg  Valganciclovir every day.   Normocytic anemia: s/p 2 units of pRBCs transfused so far. Will transfuse if Hb <7.0    Hyponatremia: improved. D/c 3% and increase NaCl tabs and continue on fluid restriction as per nephro.    Constipation: resolved          DVT prophylaxis: lovenox  Code Status: full  Family Communication: called pt's brother, Iantha Fallen, no answer so I left a voicemail  Disposition Plan: likely d/c to SNF   Level of care: Telemetry Medical Status is: Inpatient Remains inpatient appropriate because: severity of illness    Consultants:  Gyn onco  Palliative care  Nephro   Procedures:  Antimicrobials: zosyn    Subjective: Pt c/o fatigue   Objective: Vitals:   09/20/23 1100 09/20/23 1124  09/20/23 1200 09/20/23 1400  BP: 136/89  (!) 146/96 (!) 142/95  Pulse: 99 95 94 99  Resp: (!) 24 (!) 23 20 19   Temp:  98.4 F (36.9 C)    TempSrc:  Oral    SpO2: 92% 93% 94% 92%  Weight:      Height:        Intake/Output Summary (Last 24 hours) at 09/20/2023 1536 Last data filed at 09/20/2023 1418 Gross per 24 hour  Intake 1897.26 ml  Output 200 ml  Net 1697.26 ml   Filed Weights   09/18/23 0500 09/19/23 0916 09/20/23 0500  Weight: 60.9 kg 69.6 kg 70 kg    Examination:  General exam: appears comfortable  Respiratory system: diminished breath sounds b/l  Cardiovascular system: S1/S2 +. No rubs or gallops  Gastrointestinal system: abd is soft, NT, ND & hypoactive bowel sounds Central nervous system: alert & awake. Moves all extremities Psychiatry: judgement and insight appears at baseline. Flat mood and affect     Data Reviewed: I have personally reviewed following labs and imaging studies  CBC: Recent Labs  Lab 09/16/23 0746 09/17/23 0451 09/18/23 0604 09/19/23 0445 09/20/23 0229 09/20/23 1424  WBC 6.8 6.9 7.0 6.1 5.2  --   HGB 8.0* 7.6* 7.3* 7.3* 6.8* 8.5*  HCT 23.7* 23.5* 22.5* 21.8* 20.4* 25.4*  MCV 88.4 91.4 91.5 88.3 90.7  --   PLT 71* 72* 64* 65* 66*  --    Basic Metabolic Panel: Recent Labs  Lab 09/14/23 0452 09/15/23 0812 09/16/23 0746 09/17/23 0451 09/18/23 0604 09/18/23 1501 09/19/23 0445 09/19/23 0919 09/19/23 2129 09/20/23 0229 09/20/23 0835 09/20/23 1028 09/20/23 1424  NA 129*   < > 125* 126* 123*   < > 119*   < > 123* 122* 126* 128* 125*  K 4.1   < > 3.9 4.0 4.2  --  4.0  --   --  3.8  --   --   --   CL 93*   < > 92* 91* 91*  --  89*  --   --  95*  --   --   --   CO2 22   < > 22 24 23   --  21*  --   --  20*  --   --   --   GLUCOSE 92   < > 103* 110* 102*  --  96  --   --  98  --   --   --   BUN 9   < > 11 11 11   --  10  --   --  10  --   --   --   CREATININE 0.39*   < > 0.38* 0.53 <0.30*  --  0.48  --   --  0.48  --   --   --    CALCIUM 8.5*   < > 8.1* 8.1* 8.2*  --  7.6*  --   --  7.7*  --   --   --   MG 1.8  --   --   --   --   --   --   --   --   --   --   --   --    < > =  values in this interval not displayed.   GFR: Estimated Creatinine Clearance: 80.5 mL/min (by C-G formula based on SCr of 0.48 mg/dL). Liver Function Tests: Recent Labs  Lab 09/14/23 0452  AST 30  ALT 34  ALKPHOS 516*  BILITOT 0.9  PROT 6.5  ALBUMIN 2.4*   No results for input(s): "LIPASE", "AMYLASE" in the last 168 hours.  No results for input(s): "AMMONIA" in the last 168 hours. Coagulation Profile: No results for input(s): "INR", "PROTIME" in the last 168 hours.  Cardiac Enzymes: No results for input(s): "CKTOTAL", "CKMB", "CKMBINDEX", "TROPONINI" in the last 168 hours. BNP (last 3 results) No results for input(s): "PROBNP" in the last 8760 hours. HbA1C: No results for input(s): "HGBA1C" in the last 72 hours. CBG: Recent Labs  Lab 09/14/23 0804 09/19/23 0917  GLUCAP 84 86   Lipid Profile: No results for input(s): "CHOL", "HDL", "LDLCALC", "TRIG", "CHOLHDL", "LDLDIRECT" in the last 72 hours. Thyroid Function Tests: No results for input(s): "TSH", "T4TOTAL", "FREET4", "T3FREE", "THYROIDAB" in the last 72 hours. Anemia Panel: No results for input(s): "VITAMINB12", "FOLATE", "FERRITIN", "TIBC", "IRON", "RETICCTPCT" in the last 72 hours. Sepsis Labs: No results for input(s): "PROCALCITON", "LATICACIDVEN" in the last 168 hours.   Recent Results (from the past 240 hour(s))  MRSA Next Gen by PCR, Nasal     Status: None   Collection Time: 09/19/23  9:19 AM   Specimen: Nasal Mucosa; Nasal Swab  Result Value Ref Range Status   MRSA by PCR Next Gen NOT DETECTED NOT DETECTED Final    Comment: (NOTE) The GeneXpert MRSA Assay (FDA approved for NASAL specimens only), is one component of a comprehensive MRSA colonization surveillance program. It is not intended to diagnose MRSA infection nor to guide or monitor treatment for  MRSA infections. Test performance is not FDA approved in patients less than 18 years old. Performed at [facility], [address] [place], [place] [address]           Radiology Studies: No results found.      Scheduled Meds:  sodium chloride   Intravenous Once   Chlorhexidine Gluconate Cloth  6 each Topical Daily   DULoxetine  60 mg Oral Daily   enoxaparin (LOVENOX) injection  40 mg Subcutaneous Q24H   feeding supplement  237 mL Oral TID BM   fentaNYL  1 patch Transdermal Q72H   furosemide  20 mg Oral Daily   gabapentin  600 mg Oral TID   hydrocortisone cream   Topical BID   metoprolol tartrate  25 mg Oral BID   multivitamin with minerals  1 tablet Oral Daily   pantoprazole  40 mg Oral BID   senna  1 tablet Oral Daily   sodium chloride flush  10-40 mL Intracatheter Q12H   sodium chloride flush  3 mL Intravenous Q12H   sodium chloride flush  5 mL Intracatheter Q8H   sodium chloride  1 g Oral TID WC   Continuous Infusions:  ondansetron (ZOFRAN) IV Stopped (09/14/23 2014)   piperacillin-tazobactam (ZOSYN)  IV 12.5 mL/hr at 09/20/23 1300     LOS: 11 days        Charise Killian, MD [facility] Pager [phone]  If 7PM-7AM, please contact night-coverage [email or web address] 09/20/2023, 3:36 PM

## 2023-09-20 NOTE — Progress Notes (Signed)
MEDICATION RELATED CONSULT NOTE   Pharmacy Consult for 3% NaCl Indication: Hyponatremia   Patient Measurements: Height: 5\' 6"  (167.6 cm) Weight: 69.6 kg (153 lb 7 oz) IBW/kg (Calculated) : 59.3  Intake/Output from previous day: 11/18 0701 - 11/19 0700 In: 657 [I.V.:467.9; IV Piggyback:189.2] Out: 250 [Urine:200; Drains:50] Intake/Output from this shift: Total I/O In: 523.1 [I.V.:373.1; IV Piggyback:150] Out: 200 [Urine:200]  Labs: Recent Labs    09/18/23 0604 09/19/23 0445 09/20/23 0229  WBC 7.0 6.1 5.2  HGB 7.3* 7.3* 6.8*  HCT 22.5* 21.8* 20.4*  PLT 64* 65* 66*  CREATININE <0.30* 0.48 0.48   Estimated Creatinine Clearance: 80.5 mL/min (by C-G formula based on SCr of 0.48 mg/dL).  Medical History: Past Medical History:  Diagnosis Date   Anxiety    Cancer (HCC)    Depression    Heart murmur    Hypertension     Medications:  1.8L fluid restriction NaCl 1 g BIDM 3% NaCl at 30 cc/hr  Assessment: 47 year old female admitted with acute cholecystitis and hyponatremia - worsening despite salt tabs. Nephrology consulted.   1118 0919 Na 120 1118 0948 3% NaCl started at 30 cc/hr 1118 1114 Na 123 1118 1418 Na 124 1118 1717 Na 123  1118 2129 Na 123 1119 0229 Na 122   Goal of Therapy: Increase Na by 4-6 mmol/L in 4-6 hours, 8 mmol/L in 24h  Plan:  Continue hypertonic saline at 30 cc/hr. Na has increased ~ 4 mmol in 4 hours Continue Na checks q4h while on hypertonic saline  Usher Hedberg D 09/20/2023 2:58 AM
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
## 2023-09-20 NOTE — Progress Notes (Signed)
MEDICATION RELATED CONSULT NOTE   Pharmacy Consult for 3% NaCl Indication: Hyponatremia   Patient Measurements: Height: 5\' 6"  (167.6 cm) Weight: 70 kg (154 lb 5.2 oz) IBW/kg (Calculated) : 59.3  Intake/Output from previous day: 11/18 0701 - 11/19 0700 In: 891.2 [I.V.:639.5; IV Piggyback:251.7] Out: 250 [Urine:200; Drains:50] Intake/Output from this shift: Total I/O In: 487.3 [I.V.:75.7; Blood:386.7; IV Piggyback:25] Out: -   Labs: Recent Labs    09/18/23 0604 09/19/23 0445 09/20/23 0229  WBC 7.0 6.1 5.2  HGB 7.3* 7.3* 6.8*  HCT 22.5* 21.8* 20.4*  PLT 64* 65* 66*  CREATININE <0.30* 0.48 0.48   Estimated Creatinine Clearance: 80.5 mL/min (by C-G formula based on SCr of 0.48 mg/dL).  Medical History: Past Medical History:  Diagnosis Date   Anxiety    Cancer (HCC)    Depression    Heart murmur    Hypertension    Medications:  1.8L fluid restriction NaCl 1 g BIDM 3% NaCl at 30 cc/hr  Assessment: 47 year old female admitted with acute cholecystitis and hyponatremia - worsening despite salt tabs. Nephrology consulted.   1118 0919 Na 120 1118 0948 3% NaCl started at 30 cc/hr 1118 1114 Na 123 1118 1418 Na 124 1118 1717 Na 123  1118 2129 Na 123 1119 0229 Na 122 1119 0835 Na 126  Goal of Therapy: Increase Na by 4-6 mmol/L in 4-6 hours, 8 mmol/L in 24h  Plan:  Na has increased by 6 mmol over last 24h Continue hypertonic saline at 30 cc/hr Continue Na checks q4h while on hypertonic saline  Tressie Ellis 09/20/2023 9:18 AM

## 2023-09-21 DIAGNOSIS — C799 Secondary malignant neoplasm of unspecified site: Secondary | ICD-10-CM | POA: Diagnosis not present

## 2023-09-21 DIAGNOSIS — R1114 Bilious vomiting: Secondary | ICD-10-CM | POA: Diagnosis not present

## 2023-09-21 LAB — BPAM RBC
Blood Product Expiration Date: 202412152359
ISSUE DATE / TIME: 202411190524
Unit Type and Rh: 5100

## 2023-09-21 LAB — CBC
HCT: 24.7 % — ABNORMAL LOW (ref 36.0–46.0)
Hemoglobin: 8.4 g/dL — ABNORMAL LOW (ref 12.0–15.0)
MCH: 29.6 pg (ref 26.0–34.0)
MCHC: 34 g/dL (ref 30.0–36.0)
MCV: 87 fL (ref 80.0–100.0)
Platelets: 55 10*3/uL — ABNORMAL LOW (ref 150–400)
RBC: 2.84 MIL/uL — ABNORMAL LOW (ref 3.87–5.11)
RDW: 17.4 % — ABNORMAL HIGH (ref 11.5–15.5)
WBC: 6.1 10*3/uL (ref 4.0–10.5)
nRBC: 3 % — ABNORMAL HIGH (ref 0.0–0.2)

## 2023-09-21 LAB — TYPE AND SCREEN
ABO/RH(D): O POS
Antibody Screen: NEGATIVE
Unit division: 0

## 2023-09-21 LAB — BASIC METABOLIC PANEL
Anion gap: 6 (ref 5–15)
BUN: 9 mg/dL (ref 6–20)
CO2: 21 mmol/L — ABNORMAL LOW (ref 22–32)
Calcium: 7.7 mg/dL — ABNORMAL LOW (ref 8.9–10.3)
Chloride: 95 mmol/L — ABNORMAL LOW (ref 98–111)
Creatinine, Ser: 0.52 mg/dL (ref 0.44–1.00)
GFR, Estimated: >60 mL/min (ref >60)
Glucose, Bld: 96 mg/dL (ref 70–99)
Potassium: 3.4 mmol/L — ABNORMAL LOW (ref 3.5–5.1)
Sodium: 122 mmol/L — ABNORMAL LOW (ref 135–145)

## 2023-09-21 LAB — SODIUM: Sodium: 123 mmol/L — ABNORMAL LOW (ref 135–145)

## 2023-09-21 NOTE — Progress Notes (Signed)
[facility]  ROUNDING NOTE   Subjective:   Patient was moved to floor care. However serum sodium dropped back down to 122. Previously treated with 3% saline with good response.  Objective:  Vital signs in last 24 hours:  Temp:  [98.4 F (36.9 C)-99.1 F (37.3 C)] 99.1 F (37.3 C) (11/20 0744) Pulse Rate:  [94-118] 102 (11/20 0744) Resp:  [18-24] 18 (11/20 0518) BP: (132-154)/(71-97) 136/89 (11/20 0744) SpO2:  [92 %-97 %] 96 % (11/20 0744) Weight:  [72 kg] 72 kg (11/20 0441)  Weight change: 2.4 kg Filed Weights   09/19/23 0916 09/20/23 0500 09/21/23 0441  Weight: 69.6 kg 70 kg 72 kg    Intake/Output: I/O last 3 completed shifts: In: 2080.2 [P.O.:560; I.V.:809.5; Blood:386.7; Other:15; IV Piggyback:309.1] Out: 1430 [Urine:1200; Drains:230]   Intake/Output this shift:  No intake/output data recorded.  Physical Exam: General: No acute distress  Head: Normocephalic, atraumatic. Moist oral mucosal membranes  Neck: Supple  Lungs:  Clear to auscultation, normal effort  Heart: S1S2 no rubs  Abdomen:  Soft, nontender, bowel sounds present  Extremities: No peripheral edema.  Neurologic: Awake, alert, following commands  Skin: No acute rash  Access: No hemodialysis access    Basic Metabolic Panel: Recent Labs  Lab 09/17/23 0451 09/18/23 0604 09/18/23 1501 09/19/23 0445 09/19/23 0919 09/20/23 0229 09/20/23 0835 09/20/23 1028 09/20/23 1424 09/20/23 1945 09/20/23 2250 09/21/23 0449  NA 126* 123*   < > 119*   < > 122*   < > 128* 125* 125* 125* 122*  K 4.0 4.2  --  4.0  --  3.8  --   --   --   --   --  3.4*  CL 91* 91*  --  89*  --  95*  --   --   --   --   --  95*  CO2 24 23  --  21*  --  20*  --   --   --   --   --  21*  GLUCOSE 110* 102*  --  96  --  98  --   --   --   --   --  96  BUN 11 11  --  10  --  10  --   --   --   --   --  9  CREATININE 0.53 <0.30*  --  0.48  --  0.48  --   --   --   --   --  0.52  CALCIUM 8.1* 8.2*  --  7.6*  --  7.7*   --   --   --   --   --  7.7*   < > = values in this interval not displayed.    Liver Function Tests: No results for input(s): "AST", "ALT", "ALKPHOS", "BILITOT", "PROT", "ALBUMIN" in the last 168 hours. No results for input(s): "LIPASE", "AMYLASE" in the last 168 hours. No results for input(s): "AMMONIA" in the last 168 hours.  CBC: Recent Labs  Lab 09/17/23 0451 09/18/23 0604 09/19/23 0445 09/20/23 0229 09/20/23 1424 09/21/23 0449  WBC 6.9 7.0 6.1 5.2  --  6.1  HGB 7.6* 7.3* 7.3* 6.8* 8.5* 8.4*  HCT 23.5* 22.5* 21.8* 20.4* 25.4* 24.7*  MCV 91.4 91.5 88.3 90.7  --  87.0  PLT 72* 64* 65* 66*  --  55*    Cardiac Enzymes: No results for input(s): "CKTOTAL", "CKMB", "CKMBINDEX", "TROPONINI" in the last 168 hours.  BNP: Invalid input(s): "  POCBNP"  CBG: Recent Labs  Lab 09/19/23 0917  GLUCAP 86    Microbiology: Results for orders placed or performed during the hospital encounter of 09/08/23  Blood culture (routine x 2)     Status: None   Collection Time: 09/08/23  7:48 PM   Specimen: BLOOD  Result Value Ref Range Status   Specimen Description BLOOD BLOOD RIGHT ARM  Final   Special Requests   Final    BOTTLES DRAWN AEROBIC AND ANAEROBIC Blood Culture adequate volume   Culture   Final    NO GROWTH 5 DAYS Performed at [facility], [address], [place], [place] [address]    Report Status 09/13/2023 FINAL  Final  Blood culture (routine x 2)     Status: None   Collection Time: 09/08/23  7:48 PM   Specimen: BLOOD  Result Value Ref Range Status   Specimen Description BLOOD BLOOD LEFT HAND  Final   Special Requests   Final    BOTTLES DRAWN AEROBIC AND ANAEROBIC Blood Culture adequate volume   Culture   Final    NO GROWTH 5 DAYS Performed at [facility], [address] [place], [place] [address]    Report Status 09/13/2023 FINAL  Final  MRSA Next Gen by PCR, Nasal     Status: None   Collection Time: 09/19/23  9:19 AM   Specimen: Nasal  Mucosa; Nasal Swab  Result Value Ref Range Status   MRSA by PCR Next Gen NOT DETECTED NOT DETECTED Final    Comment: (NOTE) The GeneXpert MRSA Assay (FDA approved for NASAL specimens only), is one component of a comprehensive MRSA colonization surveillance program. It is not intended to diagnose MRSA infection nor to guide or monitor treatment for MRSA infections. Test performance is not FDA approved in patients less than 39 years old. Performed at [facility], [address] [place], [place] [address]     Coagulation Studies: No results for input(s): "LABPROT", "INR" in the last 72 hours.  Urinalysis: No results for input(s): "COLORURINE", "LABSPEC", "PHURINE", "GLUCOSEU", "HGBUR", "BILIRUBINUR", "KETONESUR", "PROTEINUR", "UROBILINOGEN", "NITRITE", "LEUKOCYTESUR" in the last 72 hours.  Invalid input(s): "APPERANCEUR"    Imaging: No results found.   Medications:    ondansetron (ZOFRAN) IV Stopped (09/14/23 2014)   piperacillin-tazobactam (ZOSYN)  IV 3.375 g (09/21/23 0917)    sodium chloride   Intravenous Once   Chlorhexidine Gluconate Cloth  6 each Topical Daily   DULoxetine  60 mg Oral Daily   enoxaparin (LOVENOX) injection  40 mg Subcutaneous Q24H   feeding supplement  237 mL Oral TID BM   fentaNYL  1 patch Transdermal Q72H   furosemide  20 mg Oral Daily   gabapentin  600 mg Oral TID   hydrocortisone cream   Topical BID   metoprolol tartrate  25 mg Oral BID   multivitamin with minerals  1 tablet Oral Daily   pantoprazole  40 mg Oral BID   senna  1 tablet Oral Daily   sodium chloride flush  10-40 mL Intracatheter Q12H   sodium chloride flush  3 mL Intravenous Q12H   sodium chloride flush  5 mL Intracatheter Q8H   sodium chloride  1 g Oral TID WC   acetaminophen **OR** acetaminophen, albuterol, ALPRAZolam, HYDROmorphone, iodixanol, magnesium hydroxide, ondansetron (ZOFRAN) IV, mouth rinse, [DISCONTINUED] oxyCODONE-acetaminophen **AND** oxyCODONE,  prochlorperazine, sodium chloride flush  Assessment/ Plan:  47 y.o. 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 Mild acute metabolic acidosis. 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   Plan: Patient did have satisfactory response to 3% saline previously.  Serum sodium was up to 128 but now has dropped back down to 122.  For now continue sodium chloride 1 g 3 times daily as well as Lasix 20 mg daily.  Cannot give tolvaptan given transaminitis.  We also again counseled her on being quite strict with her fluid restriction.  Fluid restriction to be enhanced to 1500 mL daily.   LOS: 12 Sheryl Silva 11/20/20249:22 AM

## 2023-09-21 NOTE — TOC Progression Note (Signed)
Transition of Care [facility]) - Progression Note    Patient Details  Name: Sheryl Silva MRN: [id number] Date of Birth: 08/16/1976  [facility]) CM/SW Contact  Chapman Fitch, RN Phone Number: 09/21/2023, 2:16 PM  Clinical Narrative:     Per MD not medically ready for auth to be initiated for SNF       Expected Discharge Plan and Services                                               Social Determinants of Health (SDOH) Interventions SDOH Screenings   Food Insecurity: No Food Insecurity (09/09/2023)  Recent Concern: Food Insecurity - Food Insecurity Present (07/22/2023)  Housing: Low Risk  (09/09/2023)  Transportation Needs: No Transportation Needs (09/09/2023)  Recent Concern: Transportation Needs - Unmet Transportation Needs (07/22/2023)  Utilities: Not At Risk (09/09/2023)  Alcohol Screen: Low Risk  (02/15/2023)  Depression (PHQ2-9): Low Risk  (02/14/2023)  Financial Resource Strain: Low Risk  (05/09/2023)   Received from [facility], [facility]  Recent Concern: Financial Resource Strain - Medium Risk (02/15/2023)  Physical Activity: Inactive (02/15/2023)  Social Connections: Socially Isolated (02/15/2023)  Stress: Stress Concern Present (02/15/2023)  Tobacco Use: Medium Risk (09/08/2023)    Readmission Risk Interventions    09/10/2023    1:52 PM 08/22/2023   10:03 PM 08/04/2023    8:47 AM  Readmission Risk Prevention Plan  Transportation Screening Complete Complete   PCP or Specialist Appt within 3-5 Days Complete Complete Complete  HRI or Home Care Consult Complete Complete   Social Work Consult for Recovery Care Planning/Counseling Complete --   Palliative Care Screening Not Applicable Not Applicable   Medication Review Oceanographer) Complete Complete

## 2023-09-21 NOTE — TOC Progression Note (Signed)
Transition of Care [facility]) - Progression Note    Patient Details  Name: Sheryl Silva MRN: [id number] Date of Birth: Jul 04, 1976  Transition of Care [facility]) CM/SW Contact  Chapman Fitch, RN Phone Number: 09/21/2023, 12:07 PM  Clinical Narrative:      This RNCM received patient 11/20.  Per [facility] handoff patient has accepted bed at [facility].  Secure message sent to MD to determine if patient is medically appropriate for insurance auth to be initiated       Expected Discharge Plan and Services                                               Social Determinants of Health (SDOH) Interventions SDOH Screenings   Food Insecurity: No Food Insecurity (09/09/2023)  Recent Concern: Food Insecurity - Food Insecurity Present (07/22/2023)  Housing: Low Risk  (09/09/2023)  Transportation Needs: No Transportation Needs (09/09/2023)  Recent Concern: Transportation Needs - Unmet Transportation Needs (07/22/2023)  Utilities: Not At Risk (09/09/2023)  Alcohol Screen: Low Risk  (02/15/2023)  Depression (PHQ2-9): Low Risk  (02/14/2023)  Financial Resource Strain: Low Risk  (05/09/2023)   Received from [facility], [facility]  Recent Concern: Financial Resource Strain - Medium Risk (02/15/2023)  Physical Activity: Inactive (02/15/2023)  Social Connections: Socially Isolated (02/15/2023)  Stress: Stress Concern Present (02/15/2023)  Tobacco Use: Medium Risk (09/08/2023)    Readmission Risk Interventions    09/10/2023    1:52 PM 08/22/2023   10:03 PM 08/04/2023    8:47 AM  Readmission Risk Prevention Plan  Transportation Screening Complete Complete   PCP or Specialist Appt within 3-5 Days Complete Complete Complete  HRI or Home Care Consult Complete Complete   Social Work Consult for Recovery Care Planning/Counseling Complete --   Palliative Care Screening Not Applicable Not Applicable   Medication Review Oceanographer) Complete Complete

## 2023-09-21 NOTE — Progress Notes (Signed)
[facility]  [phone]) [phone] [phone]  ID: Elsie Lincoln OB: Nov 22, 1975  MR#: [id number]  AOZ#:[id number]  Patient Care Team: [name], MD as PCP - General (Internal Medicine) Benita Gutter, RN as Oncology Nurse Navigator Orlie Dakin, Tollie Pizza, MD as Consulting Physician (Oncology) Johnnette Barrios, RN as VBCI Care Management  CHIEF COMPLAINT: Metastatic signet ring adenocarcinoma, intractable pain, hyponatremia.  INTERVAL HISTORY: Patient resting in bed.  Appears comfortable.  States her pain has improved.  Continues to have significant weakness and fatigue.  Offers no further complaints.  REVIEW OF SYSTEMS:   Review of Systems  Constitutional:  Positive for malaise/fatigue. Negative for fever and weight loss.  Respiratory: Negative.  Negative for cough, hemoptysis and shortness of breath.   Cardiovascular: Negative.  Negative for chest pain and leg swelling.  Gastrointestinal:  Positive for abdominal pain. Negative for nausea.  Genitourinary: Negative.  Negative for dysuria.  Musculoskeletal:  Positive for back pain.  Skin: Negative.  Negative for rash.  Neurological:  Positive for weakness. Negative for dizziness, focal weakness and headaches.  Psychiatric/Behavioral: Negative.  The patient is not nervous/anxious.     As per HPI. Otherwise, a complete review of systems is negative.  PAST MEDICAL HISTORY: Past Medical History:  Diagnosis Date   Anxiety    Cancer (HCC)    Depression    Heart murmur    Hypertension     PAST SURGICAL HISTORY: Past Surgical History:  Procedure Laterality Date   BIOPSY  07/26/2023   Procedure: BIOPSY;  Surgeon: Toney Reil, MD;  Location: [facility] ENDOSCOPY;  Service: Gastroenterology;;   CESAREAN SECTION     COLONOSCOPY WITH PROPOFOL N/A 02/21/2023   Procedure: COLONOSCOPY WITH PROPOFOL;  Surgeon: Regis Bill, MD;  Location: [facility] ENDOSCOPY;  Service: Endoscopy;  Laterality: N/A;    DILATION AND CURETTAGE OF UTERUS     FLEXIBLE SIGMOIDOSCOPY N/A 07/26/2023   Procedure: FLEXIBLE SIGMOIDOSCOPY;  Surgeon: Toney Reil, MD;  Location: [facility] ENDOSCOPY;  Service: Gastroenterology;  Laterality: N/A;   IR IMAGING GUIDED PORT INSERTION  02/16/2023   IR PERC CHOLECYSTOSTOMY  09/09/2023    FAMILY HISTORY: Family History  Problem Relation Age of Onset   Cancer Maternal Grandmother     ADVANCED DIRECTIVES (Y/N):  @ADVDIR @  HEALTH MAINTENANCE: Social History   Tobacco Use   Smoking status: Former    Current packs/day: 0.00    Types: Cigarettes    Quit date: 12/19/2022    Years since quitting: 0.7   Smokeless tobacco: Never   Tobacco comments:    Quit smoking 12/2022  Vaping Use   Vaping status: Never Used  Substance Use Topics   Alcohol use: Yes    Comment: occ   Drug use: No     Colonoscopy:  PAP:  Bone density:  Lipid panel:  Allergies  Allergen Reactions   Nifedipine Palpitations    Current Facility-Administered Medications  Medication Dose Route Frequency Provider Last Rate Last Admin   0.9 %  sodium chloride infusion (Manually program via Guardrails IV Fluids)   Intravenous Once Charise Killian, MD       acetaminophen (TYLENOL) tablet 650 mg  650 mg Oral Q6H PRN Gertha Calkin, MD   650 mg at 09/15/23 0301   Or   acetaminophen (TYLENOL) suppository 650 mg  650 mg Rectal Q6H PRN Gertha Calkin, MD       albuterol (PROVENTIL) (2.5 MG/3ML) 0.083% nebulizer solution 2.5 mg  2.5 mg Inhalation Q6H  PRN Gertha Calkin, MD       ALPRAZolam Prudy Feeler) tablet 0.25 mg  0.25 mg Oral TID PRN Darlin Priestly, MD   0.25 mg at 09/21/23 1037   Chlorhexidine Gluconate Cloth 2 % PADS 6 each  6 each Topical Daily Darlin Priestly, MD   6 each at 09/21/23 0921   DULoxetine (CYMBALTA) DR capsule 60 mg  60 mg Oral Daily Gertha Calkin, MD   60 mg at 09/21/23 0914   enoxaparin (LOVENOX) injection 40 mg  40 mg Subcutaneous Q24H Darlin Priestly, MD   40 mg at 09/21/23 0914   feeding  supplement (ENSURE ENLIVE / ENSURE PLUS) liquid 237 mL  237 mL Oral TID BM Darlin Priestly, MD   237 mL at 09/19/23 2108   fentaNYL (DURAGESIC) 75 MCG/HR 1 patch  1 patch Transdermal Q72H Borders, Daryl Eastern, NP   1 patch at 09/19/23 1551   furosemide (LASIX) tablet 20 mg  20 mg Oral Daily Lateef, Munsoor, MD   20 mg at 09/21/23 0914   gabapentin (NEURONTIN) capsule 600 mg  600 mg Oral TID Gertha Calkin, MD   600 mg at 09/21/23 0912   hydrocortisone cream 1 %   Topical BID Charise Killian, MD   Given at 09/17/23 0940   HYDROmorphone (DILAUDID) tablet 2 mg  2 mg Oral Q2H PRN Charise Killian, MD   2 mg at 09/21/23 0913   iodixanol (VISIPAQUE) 320 MG/ML injection 5 mL  5 mL Other Once PRN El-Abd, Aaron Edelman, MD       magnesium hydroxide (MILK OF MAGNESIA) suspension 30 mL  30 mL Oral Daily PRN Darlin Priestly, MD   30 mL at 09/13/23 2112   metoprolol tartrate (LOPRESSOR) tablet 25 mg  25 mg Oral BID Darlin Priestly, MD   25 mg at 09/21/23 0914   multivitamin with minerals tablet 1 tablet  1 tablet Oral Daily Charise Killian, MD   1 tablet at 09/21/23 0915   ondansetron (ZOFRAN) 8 mg in sodium chloride 0.9 % 50 mL IVPB  8 mg Intravenous Q8H PRN Gertha Calkin, MD   Stopped at 09/14/23 2014   Oral care mouth rinse  15 mL Mouth Rinse PRN Charise Killian, MD       oxyCODONE (Oxy IR/ROXICODONE) immediate release tablet 15 mg  15 mg Oral Q6H PRN Charise Killian, MD   15 mg at 09/21/23 1031   pantoprazole (PROTONIX) EC tablet 40 mg  40 mg Oral BID Jaynie Bream, RPH   40 mg at 09/21/23 0914   piperacillin-tazobactam (ZOSYN) IVPB 3.375 g  3.375 g Intravenous Q8H Charise Killian, MD 12.5 mL/hr at 09/21/23 0917 3.375 g at 09/21/23 0917   prochlorperazine (COMPAZINE) injection 5 mg  5 mg Intravenous Q6H PRN Darlin Priestly, MD   5 mg at 09/16/23 0902   senna (SENOKOT) tablet 8.6 mg  1 tablet Oral Daily Borders, Joshua R, NP   8.6 mg at 09/20/23 0943   sodium chloride flush (NS) 0.9 % injection 10-40 mL  10-40 mL  Intracatheter Q12H Charise Killian, MD   10 mL at 09/21/23 0915   sodium chloride flush (NS) 0.9 % injection 10-40 mL  10-40 mL Intracatheter PRN Charise Killian, MD       sodium chloride flush (NS) 0.9 % injection 3 mL  3 mL Intravenous Q12H Irena Cords V, MD   3 mL at 09/21/23 0915   sodium chloride flush (NS) 0.9 %  injection 5 mL  5 mL Intracatheter Q8H Piscoya, Jose, MD   5 mL at 09/21/23 0916   sodium chloride tablet 1 g  1 g Oral TID WC Lateef, Munsoor, MD   1 g at 09/21/23 1914   Facility-Administered Medications Ordered in Other Encounters  Medication Dose Route Frequency Provider Last Rate Last Admin   heparin lock flush 100 unit/mL  500 Units Intravenous Once Borders, Ivin Booty R, NP       sodium chloride flush (NS) 0.9 % injection 10 mL  10 mL Intracatheter PRN Jeralyn Ruths, MD   10 mL at 06/29/23 1430    OBJECTIVE: Vitals:   09/21/23 0518 09/21/23 0744  BP: (!) 147/89 136/89  Pulse: (!) 103 (!) 102  Resp: 18   Temp: 98.7 F (37.1 C) 99.1 F (37.3 C)  SpO2: 93% 96%     Body mass index is 25.62 kg/m.    ECOG FS:4 - Bedbound  General: Ill-appearing, no acute distress. Eyes: Pink conjunctiva, anicteric sclera. HEENT: Normocephalic, moist mucous membranes. Lungs: No audible wheezing or coughing. Heart: Regular rate and rhythm. Abdomen: Soft, nontender, no obvious distention. Musculoskeletal: No edema, cyanosis, or clubbing. Neuro: Alert, answering all questions appropriately. Cranial nerves grossly intact. Skin: No rashes or petechiae noted. Psych: Normal affect.  LAB RESULTS:  Lab Results  Component Value Date   NA 122 (L) 09/21/2023   K 3.4 (L) 09/21/2023   CL 95 (L) 09/21/2023   CO2 21 (L) 09/21/2023   GLUCOSE 96 09/21/2023   BUN 9 09/21/2023   CREATININE 0.52 09/21/2023   CALCIUM 7.7 (L) 09/21/2023   PROT 6.5 09/14/2023   ALBUMIN 2.4 (L) 09/14/2023   AST 30 09/14/2023   ALT 34 09/14/2023   ALKPHOS 516 (H) 09/14/2023   BILITOT 0.9 09/14/2023    GFRNONAA >60 09/21/2023   GFRAA >60 09/27/2018    Lab Results  Component Value Date   WBC 6.1 09/21/2023   NEUTROABS 3.6 09/08/2023   HGB 8.4 (L) 09/21/2023   HCT 24.7 (L) 09/21/2023   MCV 87.0 09/21/2023   PLT 55 (L) 09/21/2023     STUDIES: DG Chest Port 1 View  Result Date: 09/18/2023 CLINICAL DATA:  Metastatic adenocarcinoma, dyspnea. EXAM: PORTABLE CHEST 1 VIEW COMPARISON:  08/20/2023. FINDINGS: Unremarkable cardiac silhouette. Right base consolidation or volume loss. Right-sided Port-A-Cath tip distal SVC. No pneumothorax. There may be small right-sided pleural effusion. Diffuse osteoblastic metastatic disease. IMPRESSION: Right base consolidation or volume loss. Possible small right-sided diffuse osteoblastic metastatic disease. Electronically Signed   By: Layla Maw M.D.   On: 09/18/2023 10:32   US PELVIC TRANSABD W/PELVIC DOPPLER  Result Date: 09/11/2023 CLINICAL DATA:  Left groin pain. History of stage for metastatic adenocarcinoma of the GI tract. EXAM: TRANSABDOMINAL AND TRANSVAGINAL ULTRASOUND OF PELVIS DOPPLER ULTRASOUND OF OVARIES TECHNIQUE: Both transabdominal and transvaginal ultrasound examinations of the pelvis were performed. Transabdominal technique was performed for global imaging of the pelvis including uterus, ovaries, adnexal regions, and pelvic cul-de-sac. It was necessary to proceed with endovaginal exam following the transabdominal exam to visualize the uterus and ovaries. Color and duplex Doppler ultrasound was utilized to evaluate blood flow to the ovaries. COMPARISON:  CT AP 09/08/2023 FINDINGS: Uterus Measurements: 8.2 x 4.0 x 5.6 cm = volume: 96 mL. No fibroids or other mass visualized. Endometrium Thickness: 4.1 mm.  No focal abnormality visualized. Right ovary Not visualized. Left ovary Left adnexal mass and left ovary are indistinguishable measuring 5.3 x 5.9 x 5.5 cm. Pulsed  Doppler evaluation of both ovaries demonstrates There is decreased color  Doppler flow to the left adnexal mass. No arterial waveforms identified within left adnexal mass/left ovary. Normal venous waveforms were noted. Other findings Small volume of free fluid noted within the pelvis. IMPRESSION: 1. Left adnexal mass and left ovary are indistinguishable measuring 5.3 x 5.9 x 5.5 cm. There is decreased color Doppler flow to the left adnexal mass. No arterial waveforms identified within left adnexal mass/left ovary. Normal venous waveforms were noted. Imaging findings are indeterminate but torsion cannot be excluded. 2. Small volume of free fluid noted within the pelvis. 3. Right ovary not visualized. Electronically Signed   By: Signa Kell M.D.   On: 09/11/2023 16:21   IR Perc Cholecystostomy  Result Date: 09/10/2023 INDICATION: Acute cholecystitis EXAM: Placement of percutaneous cholecystostomy tube using ultrasound and fluoroscopic guidance MEDICATIONS: Documented in the EMR ANESTHESIA/SEDATION: Moderate (conscious) sedation was employed during this procedure. A total of Versed 2 mg and Fentanyl 100 mcg was administered intravenously by the radiology nurse. Total intra-service moderate Sedation Time: 18 minutes. The patient's level of consciousness and vital signs were monitored continuously by radiology nursing throughout the procedure under my direct supervision. FLUOROSCOPY: Radiation Exposure Index (as provided by the fluoroscopic device): 1.5 minutes (8 mGy) COMPLICATIONS: None immediate. PROCEDURE: Informed written consent was obtained from the patient after a thorough discussion of the procedural risks, benefits and alternatives. All questions were addressed. Maximal Sterile Barrier Technique was utilized including caps, mask, sterile gowns, sterile gloves, sterile drape, hand hygiene and skin antiseptic. A timeout was performed prior to the initiation of the procedure. The patient was placed supine on the exam table. The right upper quadrant was prepped and draped in the  standard sterile fashion. Ultrasound of the right upper quadrant was performed for planning purposes. This again demonstrated a distended gallbladder with wall edema and sludge, consistent with acute cholecystitis. An infracostal transhepatic approach was planned. Skin entry site was marked, and local analgesia was obtained with 1% lidocaine. Under ultrasound guidance, percutaneous access was obtained into the gallbladder via an infracostal transhepatic approach using a 21 gauge Chiba needle. Access was confirmed with visualization of needle tip within the gallbladder lumen, and free return of bile. An 018 Nitrex wire was then advanced through the access needle and coiled within the gallbladder lumen. A transition dilator was advanced over this wire, through which an antegrade cholecystogram was performed. Antegrade cholecystogram demonstrated appropriate location in the gallbladder lumen and a distended gallbladder with gallstones. The cystic duct was not visualized. Over an Amplatz wire, the percutaneous tract was serially dilated followed by placement of a 10 French locking multipurpose drainage catheter into the gallbladder lumen. Locking loop was formed. Additional biliary sludge was drained. Gentle hand injection of contrast material confirmed location of the gallbladder lumen. The drainage catheter was secured to the skin using silk suture and a dressing. It was placed to bag drainage. The patient tolerated the procedure well without immediate complication. IMPRESSION: Successful placement of a 10 French percutaneous cholecystostomy tube. Cholecystostomy tube placed to bag drainage. Additional follow-up recommendations per referring surgical service. Electronically Signed   By: Olive Bass M.D.   On: 09/10/2023 11:02   CT ABDOMEN PELVIS W CONTRAST  Result Date: 09/08/2023 CLINICAL DATA:  Elevated liver enzymes, nausea and diarrhea for 3 days, cholelithiasis with acute cholecystitis on previous imaging,  history of metastatic adrenal carcinoma EXAM: CT ABDOMEN AND PELVIS WITH CONTRAST TECHNIQUE: Multidetector CT imaging of the abdomen and pelvis  was performed using the standard protocol following bolus administration of intravenous contrast. RADIATION DOSE REDUCTION: This exam was performed according to the departmental dose-optimization program which includes automated exposure control, adjustment of the mA and/or kV according to patient size and/or use of iterative reconstruction technique. CONTRAST:  OMNIPAQUE IOHEXOL 300 MG/ML  SOLN COMPARISON:  08/21/2023, 08/20/2023 FINDINGS: Lower chest: Stable bibasilar hypoventilatory changes and scarring. No acute pleural or parenchymal lung disease. Hepatobiliary: There is a new 1 cm hypodensity within the left lobe liver, corresponding to hypoechoic lesion seen on today's ultrasound. Given history of metastatic adrenal carcinoma, new metastatic lesion is suspected. Multiple gallstones are again identified, with progressive gallbladder wall thickening and intramural edema consistent with acute cholecystitis. No evidence of choledocholithiasis. Pancreas: Unremarkable. No pancreatic ductal dilatation or surrounding inflammatory changes. Spleen: Normal in size without focal abnormality. Adrenals/Urinary Tract: The adrenals are unremarkable. The kidneys enhance normally. There is new mild left-sided hydronephrosis, with mucosal enhancement at the left UPJ suggesting obstructing mucosal lesion, reference image 43/2. No right-sided obstruction. No urinary tract calculi. The bladder is unremarkable. Stomach/Bowel: No bowel obstruction or ileus. Normal appendix right lower quadrant. No bowel wall thickening or inflammatory change. Vascular/Lymphatic: Stable atherosclerosis of the aorta. There is persistent retroperitoneal lymphadenopathy consistent with metastatic disease. Index left para-aortic lymph node image 44/2 measures 8 mm in short axis, stable. No new adenopathy.  Reproductive: 5.1 x 3.9 cm left adnexal mass not appreciably changed since prior exam. Right ovary is unremarkable and stable. Stable uterine fibroid. Other: Trace pelvic free fluid unchanged. No free intraperitoneal gas. No abdominal wall hernia. Musculoskeletal: Diffuse sclerosis throughout the visualized bony structures compatible with bony metastases. No acute or pathologic fracture. Reconstructed images demonstrate no additional findings. IMPRESSION: 1. Cholelithiasis with progressive gallbladder wall thickening and intramural edema consistent with worsening acute cholecystitis. 2. New 1 cm hypodensity within the left lobe liver, concerning for new liver metastasis. This was not evident on recent CT or MRI. 3. Persistent metastatic disease, with diffuse sclerotic bony metastases and retroperitoneal adenopathy unchanged. 4. Stable indeterminate left adnexal mass. 5. New mild left hydronephrosis, with subtle mucosal enhancement within the left ureter at the UPJ. Mucosal lesion cannot be excluded, and urologic consultation may be useful. 6.  Aortic Atherosclerosis (ICD10-I70.0). Electronically Signed   By: Sharlet Salina M.D.   On: 09/08/2023 21:43   US Abdomen Limited RUQ (LIVER/GB)  Result Date: 09/08/2023 CLINICAL DATA:  Quadrant pain for 3 days EXAM: ULTRASOUND ABDOMEN LIMITED RIGHT UPPER QUADRANT COMPARISON:  08/20/2023 FINDINGS: Gallbladder: Multiple shadowing gallstones are seen filling the gallbladder lumen. Gallbladder wall remains thickened measuring up to 8 mm, with prominent intramural edema now noted. Negative sonographic Murphy sign. Common bile duct: Diameter: 11 mm Liver: Normal liver echotexture. There is a circumscribed 1.3 cm hypoechoic area within the left lobe liver, without associated abnormality on prior CT or MRI. Portal vein is patent on color Doppler imaging with normal direction of blood flow towards the liver. Other: None. IMPRESSION: 1. Cholelithiasis, with worsening gallbladder  wall thickening compatible with progressive acute cholecystitis. 2. Stable dilated common bile duct. 3. Indeterminate 1.3 cm hypoechoic area left lobe liver, not clearly seen on previous CT or MRI. Electronically Signed   By: Sharlet Salina M.D.   On: 09/08/2023 20:59    ASSESSMENT: Metastatic signet ring adenocarcinoma, intractable pain, hyponatremia.  PLAN:    Signet ring adenocarcinoma: Patient has not received chemotherapy since July 18, 2023.  Recent imaging studies suggest progressive disease.  Patient does  not have any scheduled treatments at this time.  She reiterated the fact that she will likely move back to [place] in the near future. Intractable pain: Appreciate palliative care input.  Patient is now off PCA.  Continue current narcotics as prescribed. Hyponatremia: Patient briefly transferred to the ICU to receive hypertonic saline.  Sodium improved to 128, but now her most recent result has decreased back to 122.  Continue salt tablets and fluid restriction.  Appreciate nephrology input. Anemia: Chronic unchanged.  Patient's hemoglobin is 8.4.  She has required transfusions during this hospital admission. Thrombocytopenia: Patient's platelet count has slowly declined to 55.  Monitor. Transaminitis: Resolved. 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  Continue to hold valganciclovir. Acute cholecystitis: Patient had drain placed on September 09, 2023.  Plan is to continue Zosyn for a total of 14 days.  Will follow.  Jeralyn Ruths, MD   09/21/2023 1:23 PM

## 2023-09-21 NOTE — Plan of Care (Signed)
  Problem: Pain Management: Goal: General experience of comfort will improve Outcome: Progressing   Problem: Nutrition: Goal: Adequate nutrition will be maintained Outcome: Progressing   Problem: Education: Goal: Knowledge of General Education information will improve Description: Including pain rating scale, medication(s)/side effects and non-pharmacologic comfort measures Outcome: Progressing

## 2023-09-21 NOTE — Progress Notes (Signed)
PROGRESS NOTE    Sheryl Silva  EAV:[id number] DOB: Jul 10, 1976 DOA: 09/08/2023 PCP: [name], MD  [id number]  LOS: 12 days   Brief hospital course: Sheryl Silva is a 47 y.o. 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  Also with recent hospitalization for CMV colitis and has been managed on valganciclovir by ID presented to the emergency room with abnormal labs, generalized abdominal pain, nausea, poor appetite.   S/p chole drain placement.  Worsening abdominal pain difficult to control, she was requesting IV pain meds (although she hadn't maximized her home oxycodone), later started on Dilaudid PCA which has since weaned off.  Palliative care was consulted.  Pt had to be transferred to stepdown for 3% for hyponatremia of 119. Nephro was consulted and pt is currently on NaCl tabs and fluid restriction as per nephro.   Assessment & Plan:   Acute cholecystitis  Concern of acute cholecystitis with history of stage IV signet cell adenocarcinoma, general surgery was consulted but she is not a good candidate for any surgical intervention. S/p cholecystostomy drain placement by IR on 11/8, draining bile, apparently cultures were not sent so they were ordered next day.  Improving transaminitis. Plan: --cont zosyn for 14 days --cont drain management.  Persistent pain left groin pain likely due to ovarian mass Complaining of worsening pain was started on PCA dilaudid on 11/9, however, pain does not appear to be from upper abdomen.  possibly due to left adnexal mass that caused torsion and cut off blood supply to ovary.  US pelvic with doppler, which showed  known left ovary mass with reduced blood flow. --Onc palliative care consulted, started on Fentanyl patch.  Weaned off PCA dilaudid --Gyn consulted and rec GynOnc to see pt Plan: --cont Fentanyl patch, gabapentin, oxycodone, oral dilaudid PRN. 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  Most recent CMV DNA from 09/08/23 was neg.  Pt saw ID Dr. Rivka Safer on 09/08/23, and rec holding ganciclovir while not on chemo.  But if she plans to start chemo she will have to be on suppressive therapy with 900mg  VALGanciclovir every day.  Anemia: --s/p 2u pRBC so far --monitor Hgb and transfuse to keep Hgb >7  Hyponatremia 2/2 SIADH --persistent since Aug 2024, nadir-ed to 119 on 09/18/23.  Did improve with 3% saline, however, Na dropped back down to 122 after stopping 3% saline. --Cannot give tolvaptan given transaminitis.  --cont salt tabs 1g TID for now --lasix 20 mg daily --fluid restriction to 1500 ml daily  Constipation, resolved  Hypokalemia --monitor and supplement PRN   DVT prophylaxis: Lovenox SQ Code Status: Full code  Family Communication: daughter updated at bedside today Level of care: Telemetry Medical Dispo:   The patient is from: home Anticipated d/c is to: SNF rehab Anticipated d/c date is: to be determined   Subjective and Interval History:  Pt still complained of a lot of pain, though Xanax helped.  Having BM's.   Objective: Vitals:   09/21/23 0118 09/21/23 0441 09/21/23 0518 09/21/23 0744  BP: (!) 150/93  (!) 147/89 136/89  Pulse: (!) 109  (!) 103 (!) 102  Resp:  18  18   Temp: 99.1 F (37.3 C)  98.7 F (37.1 C) 99.1 F (37.3 C)  TempSrc: Oral  Oral Oral  SpO2: 95%  93% 96%  Weight:  72 kg    Height:        Intake/Output Summary (Last 24 hours) at 09/21/2023 1915 Last data filed at 09/21/2023 0451 Gross  per 24 hour  Intake 10 ml  Output 130 ml  Net -120 ml   Filed Weights   09/19/23 0916 09/20/23 0500 09/21/23 0441  Weight: 69.6 kg 70 kg 72 kg    Examination:   Constitutional: NAD, AAOx3 HEENT: conjunctivae and lids normal, EOMI CV: No cyanosis.   RESP: normal respiratory effort, on RA Neuro: II - XII grossly intact.   Psych: Normal mood and affect.  Appropriate judgement and reason   Data Reviewed: I have personally reviewed labs and imaging studies  Time spent: 50 minutes  Darlin Priestly, MD [facility] If 7PM-7AM, please contact night-coverage 09/21/2023, 7:15 PM

## 2023-09-22 DIAGNOSIS — R1114 Bilious vomiting: Secondary | ICD-10-CM | POA: Diagnosis not present

## 2023-09-22 LAB — BASIC METABOLIC PANEL
Anion gap: 11 (ref 5–15)
BUN: 9 mg/dL (ref 6–20)
CO2: 21 mmol/L — ABNORMAL LOW (ref 22–32)
Calcium: 8.1 mg/dL — ABNORMAL LOW (ref 8.9–10.3)
Chloride: 94 mmol/L — ABNORMAL LOW (ref 98–111)
Creatinine, Ser: 0.45 mg/dL (ref 0.44–1.00)
GFR, Estimated: >60 mL/min (ref >60)
Glucose, Bld: 85 mg/dL (ref 70–99)
Potassium: 3.3 mmol/L — ABNORMAL LOW (ref 3.5–5.1)
Sodium: 126 mmol/L — ABNORMAL LOW (ref 135–145)

## 2023-09-22 LAB — MAGNESIUM: Magnesium: 1.9 mg/dL (ref 1.7–2.4)

## 2023-09-22 MED ORDER — POTASSIUM CHLORIDE CRYS ER 20 MEQ PO TBCR
40.0000 meq | EXTENDED_RELEASE_TABLET | Freq: Once | ORAL | Status: AC
  Administered 2023-09-22: 40 meq via ORAL
  Filled 2023-09-22: qty 2

## 2023-09-22 NOTE — Progress Notes (Signed)
PROGRESS NOTE    Sheryl Silva  WUX:[id number] DOB: 1976-03-04 DOA: 09/08/2023 PCP: [name], MD  [id number]  LOS: 13 days   Brief hospital course: Sheryl Silva is a 47 y.o. 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  Also with recent hospitalization for CMV colitis and has been managed on valganciclovir by ID presented to the emergency room with abnormal labs, generalized abdominal pain, nausea, poor appetite.   S/p chole drain placement.  Worsening abdominal pain difficult to control, she was requesting IV pain meds (although she hadn't maximized her home oxycodone), later started on Dilaudid PCA which has since weaned off.  Palliative care was consulted.  Pt had to be transferred to stepdown for 3% for hyponatremia of 119. Nephro was consulted and pt is currently on NaCl tabs and fluid restriction as per nephro.   Assessment & Plan:   Acute cholecystitis  Concern of acute cholecystitis with history of stage IV signet cell adenocarcinoma, general surgery was consulted but she is not a good candidate for any surgical intervention. S/p cholecystostomy drain placement by IR on 11/8, draining bile, apparently cultures were not sent so they were ordered next day.  Improving transaminitis. Plan: --cont zosyn for 14 days --cont drain management.  Persistent pain left groin pain likely due to ovarian mass Complaining of worsening pain was started on PCA dilaudid on 11/9, however, pain does not appear to be from upper abdomen.  possibly due to left adnexal mass that caused torsion and cut off blood supply to ovary.  US pelvic with doppler, which showed  known left ovary mass with reduced blood flow. --Onc palliative care consulted, started on Fentanyl patch.  Weaned off PCA dilaudid --Gyn consulted and rec GynOnc to see pt Plan: --cont Fentanyl patch, gabapentin, oxycodone, oral dilaudid PRN. 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  Most recent CMV DNA from 09/08/23 was neg.  Pt saw ID Dr. Rivka Safer on 09/08/23, and rec holding ganciclovir while not on chemo.  But if she plans to start chemo she will have to be on suppressive therapy with 900mg  VALGanciclovir every day.  Anemia: --s/p 2u pRBC so far --monitor Hgb and transfuse to keep Hgb >7  Hyponatremia 2/2 SIADH --persistent since Aug 2024, nadir-ed to 119 on 09/18/23.  Did improve with 3% saline, however, Na dropped back down to 122 after stopping 3% saline. --Cannot give tolvaptan given transaminitis.  --Na improved to 126 this morning. Plan: --cont salt tabs 1g TID for now --lasix 20 mg daily --cont fluid restriction 1500 ml daily  Constipation, resolved  Hypokalemia --monitor and supplement PRN   DVT prophylaxis: Lovenox SQ Code Status: Full code  Family Communication:  Level of care: Telemetry Medical Dispo:   The patient is from: home Anticipated d/c is to: SNF rehab Anticipated d/c date is: 1-2 days   Subjective and Interval History:  Pt complained of nausea today.  Pain appeared stable on current regimen.   Objective: Vitals:   09/22/23 0326 09/22/23 0530 09/22/23 0951 09/22/23 1507  BP: 139/88 (!) 137/91 128/80 139/84  Pulse: (!) 115 (!) 109 (!) 103 96  Resp: 18  18 18 16   Temp: 99 F (37.2 C) 99.5 F (37.5 C) 97.9 F (36.6 C) 98.3 F (36.8 C)  TempSrc: Oral Oral Oral Oral  SpO2: 95% 93% 99% 94%  Weight:      Height:        Intake/Output Summary (Last 24 hours) at 09/22/2023 1829 Last data  filed at 09/22/2023 0820 Gross per 24 hour  Intake 240 ml  Output 250 ml  Net -10 ml   Filed Weights   09/20/23 0500 09/21/23 0441 09/22/23 0324  Weight: 70 kg 72 kg 70.7 kg    Examination:   Constitutional: NAD, AAOx3, sitting in recliner HEENT: conjunctivae and lids normal, EOMI CV: No cyanosis.   RESP: normal respiratory effort, on RA Neuro: II - XII grossly intact.     Data Reviewed: I have personally reviewed labs and imaging studies  Time spent: 35 minutes  Darlin Priestly, MD [facility] If 7PM-7AM, please contact night-coverage 09/22/2023, 6:29 PM

## 2023-09-22 NOTE — TOC Progression Note (Signed)
Transition of Care [facility]) - Progression Note    Patient Details  Name: Sheryl Silva MRN: [id number] Date of Birth: [birth year]  [facility]) CM/SW Contact  Chapman Fitch, RN Phone Number: 09/22/2023, 10:17 AM  Clinical Narrative:        Per Georgina Peer at [facility] they started insurance auth on 11/19, and it is pending     Expected Discharge Plan and Services                                               Social Determinants of Health (SDOH) Interventions SDOH Screenings   Food Insecurity: No Food Insecurity (09/09/2023)  Recent Concern: Food Insecurity - Food Insecurity Present (07/22/2023)  Housing: Low Risk  (09/09/2023)  Transportation Needs: No Transportation Needs (09/09/2023)  Recent Concern: Transportation Needs - Unmet Transportation Needs (07/22/2023)  Utilities: Not At Risk (09/09/2023)  Alcohol Screen: Low Risk  (02/15/2023)  Depression (PHQ2-9): Low Risk  (02/14/2023)  Financial Resource Strain: Low Risk  (05/09/2023)   [facility], [facility]  Recent Concern: Financial Resource Strain - Medium Risk (02/15/2023)  Physical Activity: Inactive (02/15/2023)  Social Connections: Socially Isolated (02/15/2023)  Stress: Stress Concern Present (02/15/2023)  Tobacco Use: Medium Risk (09/08/2023)    Readmission Risk Interventions    09/10/2023    1:52 PM 08/22/2023   10:03 PM 08/04/2023    8:47 AM  Readmission Risk Prevention Plan  Transportation Screening Complete Complete   PCP or Specialist Appt within 3-5 Days Complete Complete Complete  HRI or Home Care Consult Complete Complete   Social Work Consult for Recovery Care Planning/Counseling Complete --   Palliative Care Screening Not Applicable Not Applicable   Medication Review Oceanographer) Complete Complete

## 2023-09-22 NOTE — Plan of Care (Signed)
  Problem: Clinical Measurements: Goal: Ability to maintain clinical measurements within normal limits will improve Outcome: Progressing   Problem: Clinical Measurements: Goal: Diagnostic test results will improve Outcome: Progressing   Problem: Clinical Measurements: Goal: Respiratory complications will improve Outcome: Progressing   Problem: Clinical Measurements: Goal: Cardiovascular complication will be avoided Outcome: Progressing

## 2023-09-22 NOTE — Plan of Care (Signed)
  Problem: Nutrition: Goal: Adequate nutrition will be maintained Outcome: Progressing   Problem: Activity: Goal: Risk for activity intolerance will decrease Outcome: Progressing   

## 2023-09-22 NOTE — Progress Notes (Signed)
[facility]  ROUNDING NOTE   Subjective:   Serum sodium was improved significantly this a.m. to 126. Patient states that she did do a better job of limiting her fluid intake yesterday.  Objective:  Vital signs in last 24 hours:  Temp:  [97.9 F (36.6 C)-99.5 F (37.5 C)] 98.3 F (36.8 C) (11/21 1507) Pulse Rate:  [96-115] 96 (11/21 1507) Resp:  [16-18] 16 (11/21 1507) BP: (128-139)/(80-91) 139/84 (11/21 1507) SpO2:  [93 %-99 %] 94 % (11/21 1507) Weight:  [70.7 kg] 70.7 kg (11/21 0324)  Weight change: -1.3 kg Filed Weights   09/20/23 0500 09/21/23 0441 09/22/23 0324  Weight: 70 kg 72 kg 70.7 kg    Intake/Output: I/O last 3 completed shifts: In: 250 [P.O.:240; Other:10] Out: 280 [Drains:280]   Intake/Output this shift:  Total I/O In: -  Out: 100 [Drains:100]  Physical Exam: General: No acute distress  Head: Normocephalic, atraumatic. Moist oral mucosal membranes  Neck: Supple  Lungs:  Clear to auscultation, normal effort  Heart: S1S2 no rubs  Abdomen:  Soft, nontender, bowel sounds present  Extremities: No peripheral edema.  Neurologic: Awake, alert, following commands  Skin: No acute rash  Access: No hemodialysis access    Basic Metabolic Panel: Recent Labs  Lab 09/18/23 0604 09/18/23 1501 09/19/23 0445 09/19/23 0919 09/20/23 0229 09/20/23 0835 09/20/23 1945 09/20/23 2250 09/21/23 0449 09/21/23 1416 09/22/23 0454  NA 123*   < > 119*   < > 122*   < > 125* 125* 122* 123* 126*  K 4.2  --  4.0  --  3.8  --   --   --  3.4*  --  3.3*  CL 91*  --  89*  --  95*  --   --   --  95*  --  94*  CO2 23  --  21*  --  20*  --   --   --  21*  --  21*  GLUCOSE 102*  --  96  --  98  --   --   --  96  --  85  BUN 11  --  10  --  10  --   --   --  9  --  9  CREATININE <0.30*  --  0.48  --  0.48  --   --   --  0.52  --  0.45  CALCIUM 8.2*  --  7.6*  --  7.7*  --   --   --  7.7*  --  8.1*  MG  --   --   --   --   --   --   --   --   --   --  1.9   < > =  values in this interval not displayed.    Liver Function Tests: No results for input(s): "AST", "ALT", "ALKPHOS", "BILITOT", "PROT", "ALBUMIN" in the last 168 hours. No results for input(s): "LIPASE", "AMYLASE" in the last 168 hours. No results for input(s): "AMMONIA" in the last 168 hours.  CBC: Recent Labs  Lab 09/17/23 0451 09/18/23 0604 09/19/23 0445 09/20/23 0229 09/20/23 1424 09/21/23 0449  WBC 6.9 7.0 6.1 5.2  --  6.1  HGB 7.6* 7.3* 7.3* 6.8* 8.5* 8.4*  HCT 23.5* 22.5* 21.8* 20.4* 25.4* 24.7*  MCV 91.4 91.5 88.3 90.7  --  87.0  PLT 72* 64* 65* 66*  --  55*    Cardiac Enzymes: No results for input(s): "CKTOTAL", "CKMB", "CKMBINDEX", "  TROPONINI" in the last 168 hours.  BNP: Invalid input(s): "POCBNP"  CBG: Recent Labs  Lab 09/19/23 0917  GLUCAP 86    Microbiology: Results for orders placed or performed during the hospital encounter of 09/08/23  Blood culture (routine x 2)     Status: None   Collection Time: 09/08/23  7:48 PM   Specimen: BLOOD  Result Value Ref Range Status   Specimen Description BLOOD BLOOD RIGHT ARM  Final   Special Requests   Final    BOTTLES DRAWN AEROBIC AND ANAEROBIC Blood Culture adequate volume   Culture   Final    NO GROWTH 5 DAYS Performed at [facility], [address] [place], [place] [address]    Report Status 09/13/2023 FINAL  Final  Blood culture (routine x 2)     Status: None   Collection Time: 09/08/23  7:48 PM   Specimen: BLOOD  Result Value Ref Range Status   Specimen Description BLOOD BLOOD LEFT HAND  Final   Special Requests   Final    BOTTLES DRAWN AEROBIC AND ANAEROBIC Blood Culture adequate volume   Culture   Final    NO GROWTH 5 DAYS Performed at [facility], [address] [place], [place] [address]    Report Status 09/13/2023 FINAL  Final  MRSA Next Gen by PCR, Nasal     Status: None   Collection Time: 09/19/23  9:19 AM   Specimen: Nasal Mucosa; Nasal Swab  Result Value Ref Range  Status   MRSA by PCR Next Gen NOT DETECTED NOT DETECTED Final    Comment: (NOTE) The GeneXpert MRSA Assay (FDA approved for NASAL specimens only), is one component of a comprehensive MRSA colonization surveillance program. It is not intended to diagnose MRSA infection nor to guide or monitor treatment for MRSA infections. Test performance is not FDA approved in patients less than 63 years old. Performed at [facility], [address] [place], [place] [address]     Coagulation Studies: No results for input(s): "LABPROT", "INR" in the last 72 hours.  Urinalysis: No results for input(s): "COLORURINE", "LABSPEC", "PHURINE", "GLUCOSEU", "HGBUR", "BILIRUBINUR", "KETONESUR", "PROTEINUR", "UROBILINOGEN", "NITRITE", "LEUKOCYTESUR" in the last 72 hours.  Invalid input(s): "APPERANCEUR"    Imaging: No results found.   Medications:    ondansetron (ZOFRAN) IV 8 mg (09/22/23 1307)   piperacillin-tazobactam (ZOSYN)  IV 3.375 g (09/22/23 9147)    sodium chloride   Intravenous Once   Chlorhexidine Gluconate Cloth  6 each Topical Daily   DULoxetine  60 mg Oral Daily   enoxaparin (LOVENOX) injection  40 mg Subcutaneous Q24H   feeding supplement  237 mL Oral TID BM   fentaNYL  1 patch Transdermal Q72H   furosemide  20 mg Oral Daily   gabapentin  600 mg Oral TID   hydrocortisone cream   Topical BID   metoprolol tartrate  25 mg Oral BID   multivitamin with minerals  1 tablet Oral Daily   pantoprazole  40 mg Oral BID   senna  1 tablet Oral Daily   sodium chloride flush  10-40 mL Intracatheter Q12H   sodium chloride flush  3 mL Intravenous Q12H   sodium chloride flush  5 mL Intracatheter Q8H   sodium chloride  1 g Oral TID WC   acetaminophen **OR** acetaminophen, albuterol, ALPRAZolam, HYDROmorphone, iodixanol, magnesium hydroxide, ondansetron (ZOFRAN) IV, mouth rinse, [DISCONTINUED] oxyCODONE-acetaminophen **AND** oxyCODONE, prochlorperazine, sodium chloride flush  Assessment/  Plan:  47 y.o. female  with a  PMHx of stage IV metastatic adenocarcinoma of GI source, hypertension, chronic pain syndrome, anxiety/depression, GERD, history of cholecystitis, CMV colitis, who was admitted to [facility] on 09/08/2023 for evaluation of generalized abdominal pain, nausea, and decreased appetite.   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 Mild acute metabolic acidosis. 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   Plan: Patient serum sodium was up to 126 earlier this a.m.  We commended the patient on her efforts at fluid restriction.  Doubt that we will reach a normal serum sodium but target sodium would be between 1 28-130.  Continue sodium chloride 1 g 3 times daily as well as fluid restriction of 1.5 L/day.  Hesitant to add tolvaptan given recent transaminitis.   LOS: [address] [place] 11/21/20244:16 PM

## 2023-09-23 ENCOUNTER — Inpatient Hospital Stay: Payer: 59

## 2023-09-23 DIAGNOSIS — R1084 Generalized abdominal pain: Secondary | ICD-10-CM | POA: Diagnosis not present

## 2023-09-23 DIAGNOSIS — Z515 Encounter for palliative care: Secondary | ICD-10-CM | POA: Diagnosis not present

## 2023-09-23 DIAGNOSIS — G893 Neoplasm related pain (acute) (chronic): Secondary | ICD-10-CM | POA: Diagnosis not present

## 2023-09-23 LAB — BASIC METABOLIC PANEL
Anion gap: 11 (ref 5–15)
BUN: 9 mg/dL (ref 6–20)
CO2: 21 mmol/L — ABNORMAL LOW (ref 22–32)
Calcium: 8.1 mg/dL — ABNORMAL LOW (ref 8.9–10.3)
Chloride: 96 mmol/L — ABNORMAL LOW (ref 98–111)
Creatinine, Ser: 0.59 mg/dL (ref 0.44–1.00)
GFR, Estimated: >60 mL/min (ref >60)
Glucose, Bld: 83 mg/dL (ref 70–99)
Potassium: 3.3 mmol/L — ABNORMAL LOW (ref 3.5–5.1)
Sodium: 128 mmol/L — ABNORMAL LOW (ref 135–145)

## 2023-09-23 MED ORDER — OXYCODONE HCL 5 MG PO TABS
15.0000 mg | ORAL_TABLET | ORAL | Status: DC | PRN
Start: 2023-09-23 — End: 2023-10-06
  Administered 2023-09-23 – 2023-10-04 (×47): 15 mg via ORAL
  Filled 2023-09-23 (×48): qty 3

## 2023-09-23 MED ORDER — FENTANYL 100 MCG/HR TD PT72
1.0000 | MEDICATED_PATCH | TRANSDERMAL | Status: DC
Start: 2023-09-23 — End: 2023-09-26
  Administered 2023-09-23 – 2023-09-25 (×2): 1 via TRANSDERMAL
  Filled 2023-09-23 (×4): qty 1

## 2023-09-23 MED ORDER — POTASSIUM CHLORIDE CRYS ER 20 MEQ PO TBCR
40.0000 meq | EXTENDED_RELEASE_TABLET | Freq: Once | ORAL | Status: AC
  Administered 2023-09-23: 40 meq via ORAL
  Filled 2023-09-23: qty 2

## 2023-09-23 NOTE — TOC Progression Note (Signed)
Transition of Care [facility]) - Progression Note    Patient Details  Name: [name] MRN: [id number] Date of Birth: 1976/10/31  Transition of Care [facility]) CM/SW Contact  Chapman Fitch, RN Phone Number: 09/23/2023, 3:11 PM  Clinical Narrative:      Received call from [name] [phone]) with [employer] stating that Berkley Harvey had been denied since patient transferred to ICU. She states there is not an option for auth to be started and that a peer to peer must be completed.    Peer to peer information provided to Dr Fran Lowes      Expected Discharge Plan and Services                                               Social Determinants of Health (SDOH) Interventions SDOH Screenings   Food Insecurity: No Food Insecurity (09/09/2023)  Recent Concern: Food Insecurity - Food Insecurity Present (07/22/2023)  Housing: Low Risk  (09/09/2023)  Transportation Needs: No Transportation Needs (09/09/2023)  Recent Concern: Transportation Needs - Unmet Transportation Needs (07/22/2023)  Utilities: Not At Risk (09/09/2023)  Alcohol Screen: Low Risk  (02/15/2023)  Depression (PHQ2-9): Low Risk  (02/14/2023)  Financial Resource Strain: Low Risk  (05/09/2023)   Received from [facility], [facility]  Recent Concern: Financial Resource Strain - Medium Risk (02/15/2023)  Physical Activity: Inactive (02/15/2023)  Social Connections: Socially Isolated (02/15/2023)  Stress: Stress Concern Present (02/15/2023)  Tobacco Use: Medium Risk (09/08/2023)    Readmission Risk Interventions    09/10/2023    1:52 PM 08/22/2023   10:03 PM 08/04/2023    8:47 AM  Readmission Risk Prevention Plan  Transportation Screening Complete Complete   PCP or Specialist Appt within 3-5 Days Complete Complete Complete  HRI or Home Care Consult Complete Complete   Social Work Consult for Recovery Care Planning/Counseling Complete --   Palliative Care Screening Not Applicable Not Applicable    Medication Review Oceanographer) Complete Complete

## 2023-09-23 NOTE — Plan of Care (Signed)
  Problem: Clinical Measurements: Goal: Ability to maintain clinical measurements within normal limits will improve Outcome: Progressing   Problem: Clinical Measurements: Goal: Will remain free from infection Outcome: Progressing   Problem: Clinical Measurements: Goal: Diagnostic test results will improve Outcome: Progressing   Problem: Clinical Measurements: Goal: Respiratory complications will improve Outcome: Progressing   

## 2023-09-23 NOTE — TOC Progression Note (Signed)
Transition of Care [facility]) - Progression Note    Patient Details  Name: Lillette Rissmiller MRN: [id number] Date of Birth: June 07, 1976  Transition of Care [facility]) CM/SW Contact  Chapman Fitch, RN Phone Number: 09/23/2023, 8:57 AM  Clinical Narrative:     Message sent to [facility] to determine status of authorization        Expected Discharge Plan and Services                                               Social Determinants of Health (SDOH) Interventions SDOH Screenings   Food Insecurity: No Food Insecurity (09/09/2023)  Recent Concern: Food Insecurity - Food Insecurity Present (07/22/2023)  Housing: Low Risk  (09/09/2023)  Transportation Needs: No Transportation Needs (09/09/2023)  Recent Concern: Transportation Needs - Unmet Transportation Needs (07/22/2023)  Utilities: Not At Risk (09/09/2023)  Alcohol Screen: Low Risk  (02/15/2023)  Depression (PHQ2-9): Low Risk  (02/14/2023)  Financial Resource Strain: Low Risk  (05/09/2023)   Received from [facility], [facility]  Recent Concern: Financial Resource Strain - Medium Risk (02/15/2023)  Physical Activity: Inactive (02/15/2023)  Social Connections: Socially Isolated (02/15/2023)  Stress: Stress Concern Present (02/15/2023)  Tobacco Use: Medium Risk (09/08/2023)    Readmission Risk Interventions    09/10/2023    1:52 PM 08/22/2023   10:03 PM 08/04/2023    8:47 AM  Readmission Risk Prevention Plan  Transportation Screening Complete Complete   PCP or Specialist Appt within 3-5 Days Complete Complete Complete  HRI or Home Care Consult Complete Complete   Social Work Consult for Recovery Care Planning/Counseling Complete --   Palliative Care Screening Not Applicable Not Applicable   Medication Review Oceanographer) Complete Complete

## 2023-09-23 NOTE — Progress Notes (Signed)
Palliative Medicine [facility] at [facility] [phone] [phone]   Name: Sheryl Silva Date: 09/23/2023 MRN: [id number]  DOB: [birth year]  Patient Care Team: [name], MD as PCP - General (Internal Medicine) Benita Gutter, RN as Oncology Nurse Navigator Jeralyn Ruths, MD as Consulting Physician (Oncology) Johnnette Barrios, RN as VBC[facility] Care Management    REASON FOR CONSULTATION: Sheryl Silva is a 47 y.o. 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  Patient recently hospitalized 07/22/2023 to 08/04/2023 with CMV colitis treated with valganciclovir.  She was hospitalized again 08/20/2023 to 08/23/2023 with cholecystitis, which was treated conservatively.  Patient was readmitted 09/08/2023 with abdominal pain and again found to have acute cholecystitis.  Patient underwent cholecystostomy drain placed by IR on 11/8.  She has subsequently had ongoing abdominal pain.  Palliative care was consulted to address goals and manage ongoing symptoms.   CODE STATUS: Full code  PAST MEDICAL HISTORY: Past Medical History:  Diagnosis Date   Anxiety    Cancer (HCC)    Depression    Heart murmur    Hypertension     PAST SURGICAL HISTORY:  Past Surgical History:  Procedure Laterality Date   BIOPSY  07/26/2023   Procedure: BIOPSY;  Surgeon: Toney Reil, MD;  Location: [facility] ENDOSCOPY;  Service: Gastroenterology;;   CESAREAN SECTION     COLONOSCOPY WITH PROPOFOL N/A 02/21/2023   Procedure: COLONOSCOPY WITH PROPOFOL;  Surgeon: Regis Bill, MD;  Location: [facility] ENDOSCOPY;  Service: Endoscopy;  Laterality: N/A;   DILATION AND CURETTAGE OF UTERUS     FLEXIBLE SIGMOIDOSCOPY N/A 07/26/2023   Procedure: FLEXIBLE SIGMOIDOSCOPY;  Surgeon: Toney Reil, MD;  Location: [facility] ENDOSCOPY;  Service: Gastroenterology;  Laterality: N/A;   IR IMAGING GUIDED PORT  INSERTION  02/16/2023   IR PERC CHOLECYSTOSTOMY  09/09/2023    HEMATOLOGY/ONCOLOGY HISTORY:  Oncology History  Signet ring cell adenocarcinoma (HCC)  02/07/2023 Cancer Staging   Staging form: Exocrine Pancreas, AJCC 8th Edition - Clinical stage from 02/07/2023: Stage IV (cTX, cNX, pM1) - Signed by Jeralyn Ruths, MD on 02/14/2023 Stage prefix: Initial diagnosis   02/14/2023 Initial Diagnosis   Signet ring cell adenocarcinoma   03/02/2023 -  Chemotherapy   Patient is on Treatment Plan : GI ORIGIN FOLFOX+KEYTRUDA q21d x12 followed by Gwenlyn Fudge       ALLERGIES:  is allergic to nifedipine.  MEDICATIONS:  Current Facility-Administered Medications  Medication Dose Route Frequency Provider Last Rate Last Admin   0.9 %  sodium chloride infusion (Manually program via Guardrails IV Fluids)   Intravenous Once Charise Killian, MD       acetaminophen (TYLENOL) tablet 650 mg  650 mg Oral Q6H PRN Gertha Calkin, MD   650 mg at 09/23/23 1037   Or   acetaminophen (TYLENOL) suppository 650 mg  650 mg Rectal Q6H PRN Gertha Calkin, MD       albuterol (PROVENTIL) (2.5 MG/3ML) 0.083% nebulizer solution 2.5 mg  2.5 mg Inhalation Q6H PRN Gertha Calkin, MD       ALPRAZolam Prudy Feeler) tablet 0.25 mg  0.25 mg Oral TID PRN Darlin Priestly, MD   0.25 mg at 09/23/23 0645   Chlorhexidine Gluconate Cloth 2 % PADS 6 each  6 each Topical Daily Darlin Priestly, MD   6 each at 09/23/23 1015   DULoxetine (CYMBALTA) DR capsule 60 mg  60 mg Oral Daily Gertha Calkin,  MD   60 mg at 09/23/23 1014   enoxaparin (LOVENOX) injection 40 mg  40 mg Subcutaneous Q24H Darlin Priestly, MD   40 mg at 09/23/23 1015   feeding supplement (ENSURE ENLIVE / ENSURE PLUS) liquid 237 mL  237 mL Oral TID BM Darlin Priestly, MD   237 mL at 09/23/23 1018   fentaNYL (DURAGESIC) 100 MCG/HR 1 patch  1 patch Transdermal Q72H Theola Cuellar, Daryl Eastern, NP       furosemide (LASIX) tablet 20 mg  20 mg Oral Daily Lateef, Munsoor, MD   20 mg at 09/23/23 1013   gabapentin  (NEURONTIN) capsule 600 mg  600 mg Oral TID Gertha Calkin, MD   600 mg at 09/23/23 1014   hydrocortisone cream 1 %   Topical BID Charise Killian, MD   Given at 09/17/23 0940   iodixanol (VISIPAQUE) 320 MG/ML injection 5 mL  5 mL Other Once PRN El-Abd, Aaron Edelman, MD       magnesium hydroxide (MILK OF MAGNESIA) suspension 30 mL  30 mL Oral Daily PRN Darlin Priestly, MD   30 mL at 09/13/23 2112   metoprolol tartrate (LOPRESSOR) tablet 25 mg  25 mg Oral BID Darlin Priestly, MD   25 mg at 09/23/23 1014   multivitamin with minerals tablet 1 tablet  1 tablet Oral Daily Charise Killian, MD   1 tablet at 09/23/23 1014   ondansetron (ZOFRAN) 8 mg in sodium chloride 0.9 % 50 mL IVPB  8 mg Intravenous Q8H PRN Gertha Calkin, MD 216 mL/hr at 09/22/23 1307 8 mg at 09/22/23 1307   Oral care mouth rinse  15 mL Mouth Rinse PRN Charise Killian, MD       oxyCODONE (Oxy IR/ROXICODONE) immediate release tablet 15 mg  15 mg Oral Q4H PRN Coral Soler, Daryl Eastern, NP       pantoprazole (PROTONIX) EC tablet 40 mg  40 mg Oral BID Cordella Register A, RPH   40 mg at 09/23/23 1013   prochlorperazine (COMPAZINE) injection 5 mg  5 mg Intravenous Q6H PRN Darlin Priestly, MD   5 mg at 09/16/23 0902   senna (SENOKOT) tablet 8.6 mg  1 tablet Oral Daily Noya Santarelli R, NP   8.6 mg at 09/23/23 1013   sodium chloride flush (NS) 0.9 % injection 10-40 mL  10-40 mL Intracatheter Q12H Charise Killian, MD   10 mL at 09/23/23 1017   sodium chloride flush (NS) 0.9 % injection 10-40 mL  10-40 mL Intracatheter PRN Charise Killian, MD       sodium chloride flush (NS) 0.9 % injection 3 mL  3 mL Intravenous Q12H Irena Cords V, MD   3 mL at 09/22/23 1610   sodium chloride flush (NS) 0.9 % injection 5 mL  5 mL Intracatheter Q8H Piscoya, Jose, MD   5 mL at 09/23/23 1017   sodium chloride tablet 1 g  1 g Oral TID WC Lateef, Munsoor, MD   1 g at 09/23/23 1200   Facility-Administered Medications Ordered in Other Encounters  Medication Dose Route Frequency  Provider Last Rate Last Admin   heparin lock flush 100 unit/mL  500 Units Intravenous Once Leora Platt, Ivin Booty R, NP       sodium chloride flush (NS) 0.9 % injection 10 mL  10 mL Intracatheter PRN Jeralyn Ruths, MD   10 mL at 06/29/23 1430    VITAL SIGNS: BP 134/87 (BP Location: Right Arm)   Pulse (!) 109  Temp 99.2 F (37.3 C)   Resp 18   Ht 5\' 6"  (1.676 m)   Wt 152 lb 5.4 oz (69.1 kg)   SpO2 90%   BMI 24.59 kg/m  Filed Weights   09/21/23 0441 09/22/23 0324 09/23/23 0218  Weight: 158 lb 11.7 oz (72 kg) 155 lb 13.8 oz (70.7 kg) 152 lb 5.4 oz (69.1 kg)    Estimated body mass index is 24.59 kg/m as calculated from the following:   Height as of this encounter: 5\' 6"  (1.676 m).   Weight as of this encounter: 152 lb 5.4 oz (69.1 kg).  LABS: CBC:    Component Value Date/Time   WBC 6.1 09/21/2023 0449   HGB 8.4 (L) 09/21/2023 0449   HGB 8.1 (L) 09/08/2023 1332   HCT 24.7 (L) 09/21/2023 0449   PLT 55 (L) 09/21/2023 0449   PLT 166 09/08/2023 1332   MCV 87.0 09/21/2023 0449   NEUTROABS 3.6 09/08/2023 1332   LYMPHSABS 0.7 09/08/2023 1332   MONOABS 0.3 09/08/2023 1332   EOSABS 0.0 09/08/2023 1332   BASOSABS 0.0 09/08/2023 1332   Comprehensive Metabolic Panel:    Component Value Date/Time   NA 128 (L) 09/23/2023 0623   K 3.3 (L) 09/23/2023 0623   CL 96 (L) 09/23/2023 0623   CO2 21 (L) 09/23/2023 0623   BUN 9 09/23/2023 0623   CREATININE 0.59 09/23/2023 0623   CREATININE 0.50 09/08/2023 1332   GLUCOSE 83 09/23/2023 0623   CALCIUM 8.1 (L) 09/23/2023 0623   AST 30 09/14/2023 0452   AST 683 (HH) 09/08/2023 1332   ALT 34 09/14/2023 0452   ALT 324 (HH) 09/08/2023 1332   ALKPHOS 516 (H) 09/14/2023 0452   BILITOT 0.9 09/14/2023 0452   BILITOT 1.3 (H) 09/08/2023 1332   PROT 6.5 09/14/2023 0452   ALBUMIN 2.4 (L) 09/14/2023 0452    RADIOGRAPHIC STUDIES: DG Chest Port 1 View  Result Date: 09/18/2023 CLINICAL DATA:  Metastatic adenocarcinoma, dyspnea. EXAM: PORTABLE  CHEST 1 VIEW COMPARISON:  08/20/2023. FINDINGS: Unremarkable cardiac silhouette. Right base consolidation or volume loss. Right-sided Port-A-Cath tip distal SVC. No pneumothorax. There may be small right-sided pleural effusion. Diffuse osteoblastic metastatic disease. IMPRESSION: Right base consolidation or volume loss. Possible small right-sided diffuse osteoblastic metastatic disease. Electronically Signed   By: Layla Maw M.D.   On: 09/18/2023 10:32   US PELVIC TRANSABD W/PELVIC DOPPLER  Result Date: 09/11/2023 CLINICAL DATA:  Left groin pain. History of stage for metastatic adenocarcinoma of the GI tract. EXAM: TRANSABDOMINAL AND TRANSVAGINAL ULTRASOUND OF PELVIS DOPPLER ULTRASOUND OF OVARIES TECHNIQUE: Both transabdominal and transvaginal ultrasound examinations of the pelvis were performed. Transabdominal technique was performed for global imaging of the pelvis including uterus, ovaries, adnexal regions, and pelvic cul-de-sac. It was necessary to proceed with endovaginal exam following the transabdominal exam to visualize the uterus and ovaries. Color and duplex Doppler ultrasound was utilized to evaluate blood flow to the ovaries. COMPARISON:  CT AP 09/08/2023 FINDINGS: Uterus Measurements: 8.2 x 4.0 x 5.6 cm = volume: 96 mL. No fibroids or other mass visualized. Endometrium Thickness: 4.1 mm.  No focal abnormality visualized. Right ovary Not visualized. Left ovary Left adnexal mass and left ovary are indistinguishable measuring 5.3 x 5.9 x 5.5 cm. Pulsed Doppler evaluation of both ovaries demonstrates There is decreased color Doppler flow to the left adnexal mass. No arterial waveforms identified within left adnexal mass/left ovary. Normal venous waveforms were noted. Other findings Small volume of free fluid noted within the pelvis.  IMPRESSION: 1. Left adnexal mass and left ovary are indistinguishable measuring 5.3 x 5.9 x 5.5 cm. There is decreased color Doppler flow to the left adnexal mass. No  arterial waveforms identified within left adnexal mass/left ovary. Normal venous waveforms were noted. Imaging findings are indeterminate but torsion cannot be excluded. 2. Small volume of free fluid noted within the pelvis. 3. Right ovary not visualized. Electronically Signed   By: Signa Kell M.D.   On: 09/11/2023 16:21   IR Perc Cholecystostomy  Result Date: 09/10/2023 INDICATION: Acute cholecystitis EXAM: Placement of percutaneous cholecystostomy tube using ultrasound and fluoroscopic guidance MEDICATIONS: Documented in the EMR ANESTHESIA/SEDATION: Moderate (conscious) sedation was employed during this procedure. A total of Versed 2 mg and Fentanyl 100 mcg was administered intravenously by the radiology nurse. Total intra-service moderate Sedation Time: 18 minutes. The patient's level of consciousness and vital signs were monitored continuously by radiology nursing throughout the procedure under my direct supervision. FLUOROSCOPY: Radiation Exposure Index (as provided by the fluoroscopic device): 1.5 minutes (8 mGy) COMPLICATIONS: None immediate. PROCEDURE: Informed written consent was obtained from the patient after a thorough discussion of the procedural risks, benefits and alternatives. All questions were addressed. Maximal Sterile Barrier Technique was utilized including caps, mask, sterile gowns, sterile gloves, sterile drape, hand hygiene and skin antiseptic. A timeout was performed prior to the initiation of the procedure. The patient was placed supine on the exam table. The right upper quadrant was prepped and draped in the standard sterile fashion. Ultrasound of the right upper quadrant was performed for planning purposes. This again demonstrated a distended gallbladder with wall edema and sludge, consistent with acute cholecystitis. An infracostal transhepatic approach was planned. Skin entry site was marked, and local analgesia was obtained with 1% lidocaine. Under ultrasound guidance,  percutaneous access was obtained into the gallbladder via an infracostal transhepatic approach using a 21 gauge Chiba needle. Access was confirmed with visualization of needle tip within the gallbladder lumen, and free return of bile. An 018 Nitrex wire was then advanced through the access needle and coiled within the gallbladder lumen. A transition dilator was advanced over this wire, through which an antegrade cholecystogram was performed. Antegrade cholecystogram demonstrated appropriate location in the gallbladder lumen and a distended gallbladder with gallstones. The cystic duct was not visualized. Over an Amplatz wire, the percutaneous tract was serially dilated followed by placement of a 10 French locking multipurpose drainage catheter into the gallbladder lumen. Locking loop was formed. Additional biliary sludge was drained. Gentle hand injection of contrast material confirmed location of the gallbladder lumen. The drainage catheter was secured to the skin using silk suture and a dressing. It was placed to bag drainage. The patient tolerated the procedure well without immediate complication. IMPRESSION: Successful placement of a 10 French percutaneous cholecystostomy tube. Cholecystostomy tube placed to bag drainage. Additional follow-up recommendations per referring surgical service. Electronically Signed   By: Olive Bass M.D.   On: 09/10/2023 11:02   CT ABDOMEN PELVIS W CONTRAST  Result Date: 09/08/2023 CLINICAL DATA:  Elevated liver enzymes, nausea and diarrhea for 3 days, cholelithiasis with acute cholecystitis on previous imaging, history of metastatic adrenal carcinoma EXAM: CT ABDOMEN AND PELVIS WITH CONTRAST TECHNIQUE: Multidetector CT imaging of the abdomen and pelvis was performed using the standard protocol following bolus administration of intravenous contrast. RADIATION DOSE REDUCTION: This exam was performed according to the departmental dose-optimization program which includes  automated exposure control, adjustment of the mA and/or kV according to patient size and/or use  of iterative reconstruction technique. CONTRAST:  OMNIPAQUE IOHEXOL 300 MG/ML  SOLN COMPARISON:  08/21/2023, 08/20/2023 FINDINGS: Lower chest: Stable bibasilar hypoventilatory changes and scarring. No acute pleural or parenchymal lung disease. Hepatobiliary: There is a new 1 cm hypodensity within the left lobe liver, corresponding to hypoechoic lesion seen on today's ultrasound. Given history of metastatic adrenal carcinoma, new metastatic lesion is suspected. Multiple gallstones are again identified, with progressive gallbladder wall thickening and intramural edema consistent with acute cholecystitis. No evidence of choledocholithiasis. Pancreas: Unremarkable. No pancreatic ductal dilatation or surrounding inflammatory changes. Spleen: Normal in size without focal abnormality. Adrenals/Urinary Tract: The adrenals are unremarkable. The kidneys enhance normally. There is new mild left-sided hydronephrosis, with mucosal enhancement at the left UPJ suggesting obstructing mucosal lesion, reference image 43/2. No right-sided obstruction. No urinary tract calculi. The bladder is unremarkable. Stomach/Bowel: No bowel obstruction or ileus. Normal appendix right lower quadrant. No bowel wall thickening or inflammatory change. Vascular/Lymphatic: Stable atherosclerosis of the aorta. There is persistent retroperitoneal lymphadenopathy consistent with metastatic disease. Index left para-aortic lymph node image 44/2 measures 8 mm in short axis, stable. No new adenopathy. Reproductive: 5.1 x 3.9 cm left adnexal mass not appreciably changed since prior exam. Right ovary is unremarkable and stable. Stable uterine fibroid. Other: Trace pelvic free fluid unchanged. No free intraperitoneal gas. No abdominal wall hernia. Musculoskeletal: Diffuse sclerosis throughout the visualized bony structures compatible with bony metastases. No  acute or pathologic fracture. Reconstructed images demonstrate no additional findings. IMPRESSION: 1. Cholelithiasis with progressive gallbladder wall thickening and intramural edema consistent with worsening acute cholecystitis. 2. New 1 cm hypodensity within the left lobe liver, concerning for new liver metastasis. This was not evident on recent CT or MRI. 3. Persistent metastatic disease, with diffuse sclerotic bony metastases and retroperitoneal adenopathy unchanged. 4. Stable indeterminate left adnexal mass. 5. New mild left hydronephrosis, with subtle mucosal enhancement within the left ureter at the UPJ. Mucosal lesion cannot be excluded, and urologic consultation may be useful. 6.  Aortic Atherosclerosis (ICD10-I70.0). Electronically Signed   By: Sharlet Salina M.D.   On: 09/08/2023 21:43   US Abdomen Limited RUQ (LIVER/GB)  Result Date: 09/08/2023 CLINICAL DATA:  Quadrant pain for 3 days EXAM: ULTRASOUND ABDOMEN LIMITED RIGHT UPPER QUADRANT COMPARISON:  08/20/2023 FINDINGS: Gallbladder: Multiple shadowing gallstones are seen filling the gallbladder lumen. Gallbladder wall remains thickened measuring up to 8 mm, with prominent intramural edema now noted. Negative sonographic Murphy sign. Common bile duct: Diameter: 11 mm Liver: Normal liver echotexture. There is a circumscribed 1.3 cm hypoechoic area within the left lobe liver, without associated abnormality on prior CT or MRI. Portal vein is patent on color Doppler imaging with normal direction of blood flow towards the liver. Other: None. IMPRESSION: 1. Cholelithiasis, with worsening gallbladder wall thickening compatible with progressive acute cholecystitis. 2. Stable dilated common bile duct. 3. Indeterminate 1.3 cm hypoechoic area left lobe liver, not clearly seen on previous CT or MRI. Electronically Signed   By: Sharlet Salina M.D.   On: 09/08/2023 20:59    PERFORMANCE STATUS (ECOG) : 2 - Symptomatic, <50% confined to bed  Review of  Systems Unless otherwise noted, a complete review of systems is negative.  Physical Exam General: NAD Cardiovascular: regular rate and rhythm Pulmonary: clear ant fields Abdomen: soft, nontender, + bowel sounds GU: no suprapubic tenderness Extremities: no edema, no joint deformities Skin: no rashes Neurological: Weakness but otherwise nonfocal  IMPRESSION: Follow-up visit.  Patient continues to endorse persistent and poorly controlled generalized  pain.  No specific focal area of increased pain.  In past 24 hours, patient has received 14 mg oral hydromorphone and 60 mg oxycodone.  Long conversation with patient regarding options of adjusting her pain regimen.  Patient would like to restart Percocet.  However, explained that her home dosing of Percocet is significantly lower than current oral medications for breakthrough pain.  Patient cannot tell much of any improvement on oral hydromorphone.  She feels like oxycodone works better.  Will therefore discontinue hydromorphone and liberalize frequency of oxycodone.  Will also increase dosage of transdermal fentanyl.  Ideally, we will have her on 1 short acting and 1 long-acting opioid.  PLAN: -Continue current scope of treatment -Increase transdermal fentanyl 100 mcg every 72 hours -Increase frequency oxycodone to every 4 hours as needed for breakthrough pain -Discontinue hydromorphone-Daily bowel regimen  Case and plan discussed with Dr. Fran Lowes  Time Total: 20 minutes  Visit consisted of counseling and education dealing with the complex and emotionally intense issues of symptom management and palliative care in the setting of serious and potentially life-threatening illness.Greater than 50%  of this time was spent counseling and coordinating care related to the above assessment and plan.  Signed by: Laurette Schimke, PhD, NP-C

## 2023-09-23 NOTE — Progress Notes (Signed)
Nutrition Follow-up  DOCUMENTATION CODES:   Not applicable  INTERVENTION:   Ensure Enlive po TID, each supplement provides 350 kcal and 20 grams of protein.  Magic cup TID with meals, each supplement provides 290 kcal and 9 grams of protein  MVI po daily   Pt remains at high refeed risk; recommend monitor potassium, magnesium and phosphorus labs daily until stable  Daily weights   NUTRITION DIAGNOSIS:   Increased nutrient needs related to cancer and cancer related treatments as evidenced by estimated needs. -ongoing   GOAL:   Patient will meet greater than or equal to 90% of their needs -not met   MONITOR:   PO intake, Supplement acceptance, Labs, Weight trends, I & O's, Skin  ASSESSMENT:   47 y.o. 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  Met with pt in room today. Pt reports decreased appetite and oral intake for several months r/t chemotherapy, abdominal pain and multiple hospital admissions. Pt reports significant weight loss but is unsure how much. Per chart, pt is down 29lbs(16%) over the past 3 months; this is significant weight loss. Pt is being followed by the RD at the cancer center. Pt reports that she has been drinking Ensure (vanilla). Pt reports poor oral intake in hospital; pt's lunch tray was sitting untouched on her side table. Pt reports ongoing nausea and abdominal pain. Pt also reports early satiety. Pt does report drinking an Ensure today. RD discussed with pt the importance of adequate nutrition needed to preserve lean muscle. Pt is going to try and drink three Ensure per day. Discussed with patient ways to increase her calorie intake by eating foods and drinking supplements that will not take up so much space in her  stomach. Pt will try the Magic Cups. Pt remains at high refeed risk. Palliative care is following. Pt does appear to be at her UBW currently.   Medications reviewed and include: lovenox, lasix, MVI, protonix, senokot, NaCl tabs  Labs reviewed: Na 128(L), K 3.3(L) Mg 1.9 wnl- 11/21 Hgb 8.4(L), Hct 24.7(L)  Drains-   NUTRITION - FOCUSED PHYSICAL EXAM:  Flowsheet Row Most Recent Value  Orbital Region No depletion  Upper Arm Region Moderate depletion  Thoracic and Lumbar Region No depletion  Buccal Region No depletion  Temple Region No depletion  Clavicle Bone Region Mild depletion  Clavicle and Acromion Bone Region Mild depletion  Scapular Bone Region No depletion  Dorsal Hand No depletion  Patellar Region Moderate depletion  Anterior Thigh Region Moderate depletion  Posterior Calf Region Moderate depletion  Edema (RD Assessment) None  Hair Reviewed  Eyes Reviewed  Mouth Reviewed  Skin Reviewed  Nails Reviewed   Diet Order:   Diet Order             Diet regular Room service appropriate? Yes; Fluid consistency: Thin; Fluid restriction: 1500 mL Fluid  Diet effective now                  EDUCATION NEEDS:   Education needs have been addressed  Skin:  Skin Assessment: Reviewed RN Assessment  Last BM:  11/20- type 6  Height:   Ht Readings from Last 1 Encounters:  09/19/23 5\' 6"  (1.676 m)    Weight:   Wt Readings from Last 1 Encounters:  09/23/23 69.1  kg    Ideal Body Weight:  59.1 kg  BMI:  Body mass index is 24.59 kg/m.  Estimated Nutritional Needs:   Kcal:  1800-2100kcal/day  Protein:  90-105 grams  Fluid:  1.8-2.1L/day  Betsey Holiday MS, RD, LDN Please refer to [facility] for RD and/or RD on-call/weekend/after hours pager

## 2023-09-23 NOTE — Progress Notes (Signed)
PROGRESS NOTE    Sheryl Silva  ZOX:[id number] DOB: 1976/05/29 DOA: 09/08/2023 PCP: [name], MD  201A/201A-AA  LOS: 14 days   Brief hospital course: Sheryl Silva is a 47 y.o. 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  Also with recent hospitalization for CMV colitis and has been managed on valganciclovir by ID presented to the emergency room with abnormal labs, generalized abdominal pain, nausea, poor appetite.   S/p chole drain placement.  Worsening abdominal pain difficult to control, she was requesting IV pain meds (although she hadn't maximized her home oxycodone), later started on Dilaudid PCA which has since weaned off.  Palliative care was consulted.  Pt had to be transferred to stepdown for 3% for hyponatremia of 119. Nephro was consulted and pt is currently on NaCl tabs and fluid restriction as per nephro.   Assessment & Plan:   Acute cholecystitis  Concern of acute cholecystitis with history of stage IV signet cell adenocarcinoma, general surgery was consulted but she is not a good candidate for any surgical intervention. S/p cholecystostomy drain placement by IR on 11/8, draining bile, apparently cultures were not sent so they were ordered next day.  Improving transaminitis. --completed 14 days of zosyn Plan: --cont drain management.  Persistent pain left groin pain likely due to ovarian mass Complaining of worsening pain was started on PCA dilaudid on 11/9, however, pain does not appear to be from upper abdomen.  possibly due to left adnexal mass that caused torsion and cut off blood supply to ovary.  US pelvic with doppler, which showed  known left ovary mass with reduced blood flow. --Onc palliative care consulted, started on Fentanyl patch.  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  Most recent CMV DNA from 09/08/23 was neg.  Pt saw ID Dr. Rivka Safer on 09/08/23, and rec holding ganciclovir while not on chemo.  But if she plans to start chemo she will have to be on suppressive therapy with 900mg  VALGanciclovir every day.  Anemia: --s/p 2u pRBC so far --monitor Hgb and transfuse to keep Hgb >7  Hyponatremia 2/2 SIADH --persistent since Aug 2024, nadir-ed to 119 on 09/18/23.  Did improve with 3% saline, however, Na dropped back down to 122 after stopping 3% saline. --Cannot give tolvaptan given transaminitis.  --Na improving on current regimen Plan: --cont salt tabs 1g TID --lasix 20 mg daily --cont fluid restriction 1500 ml daily  Constipation, resolved  Hypokalemia --monitor and supplement PRN  Painful and blackened left 2nd toe --discussed with vascular surgery NP --obtain ABI   DVT prophylaxis: Lovenox SQ Code Status: Full code  Family Communication:  Level of care: Telemetry Medical Dispo:   The patient is from: home Anticipated d/c is to: SNF rehab Anticipated d/c date is: 1-2 days   Subjective and Interval History:  Pt had temp 100.2 early this morning.  Pt reported feeling sweaty.  When asked about any new lesions, pt reported pain in left 2nd  toe that's turning black.    I was notified of need to do peer to peer around mid-afternoon.  Made the call, and was told I will get a call-back from physician before 5 pm or next Monday morning.  Did not receive physician  callback today.   Objective: Vitals:   09/23/23 0325 09/23/23 0620 09/23/23 0757 09/23/23 1554  BP: 131/79 133/80 134/87 (!) 114/97  Pulse: (!) 117 (!) 117 (!) 109 95  Resp: 18 18 18 18   Temp: 100.1 F (37.8 C) 99.5 F (37.5 C) 99.2 F (37.3 C) (!) 97.5 F (36.4 C)  TempSrc: Oral Oral    SpO2: 92% 93% 90% 98%  Weight:      Height:        Intake/Output Summary (Last 24 hours) at 09/23/2023 1904 Last data filed at 09/23/2023 0644 Gross per 24 hour  Intake 10 ml  Output 200 ml  Net -190 ml   Filed Weights   09/21/23 0441 09/22/23 0324 09/23/23 0218  Weight: 72 kg 70.7 kg 69.1 kg    Examination:   Constitutional: NAD, AAOx3 HEENT: conjunctivae and lids normal, EOMI CV: No cyanosis.   RESP: normal respiratory effort, on RA Extremities: tip of left 2nd toe black and painful to touch SKIN: warm, dry Neuro: II - XII grossly intact.     Data Reviewed: I have personally reviewed labs and imaging studies  Time spent: 50 minutes  Darlin Priestly, MD [facility] If 7PM-7AM, please contact night-coverage 09/23/2023, 7:04 PM

## 2023-09-24 DIAGNOSIS — G893 Neoplasm related pain (acute) (chronic): Secondary | ICD-10-CM | POA: Diagnosis not present

## 2023-09-24 LAB — CBC
HCT: 23.9 % — ABNORMAL LOW (ref 36.0–46.0)
Hemoglobin: 8 g/dL — ABNORMAL LOW (ref 12.0–15.0)
MCH: 29.7 pg (ref 26.0–34.0)
MCHC: 33.5 g/dL (ref 30.0–36.0)
MCV: 88.8 fL (ref 80.0–100.0)
Platelets: 67 10*3/uL — ABNORMAL LOW (ref 150–400)
RBC: 2.69 MIL/uL — ABNORMAL LOW (ref 3.87–5.11)
RDW: 17.5 % — ABNORMAL HIGH (ref 11.5–15.5)
WBC: 7.1 10*3/uL (ref 4.0–10.5)
nRBC: 3.3 % — ABNORMAL HIGH (ref 0.0–0.2)

## 2023-09-24 LAB — BASIC METABOLIC PANEL
Anion gap: 6 (ref 5–15)
BUN: 11 mg/dL (ref 6–20)
CO2: 25 mmol/L (ref 22–32)
Calcium: 8.1 mg/dL — ABNORMAL LOW (ref 8.9–10.3)
Chloride: 98 mmol/L (ref 98–111)
Creatinine, Ser: 0.38 mg/dL — ABNORMAL LOW (ref 0.44–1.00)
GFR, Estimated: >60 mL/min (ref >60)
Glucose, Bld: 128 mg/dL — ABNORMAL HIGH (ref 70–99)
Potassium: 3.5 mmol/L (ref 3.5–5.1)
Sodium: 129 mmol/L — ABNORMAL LOW (ref 135–145)

## 2023-09-24 LAB — PHOSPHORUS: Phosphorus: 2.5 mg/dL (ref 2.5–4.6)

## 2023-09-24 LAB — SARS CORONAVIRUS 2 BY RT PCR: SARS Coronavirus 2 by RT PCR: NEGATIVE

## 2023-09-24 LAB — MAGNESIUM: Magnesium: 1.8 mg/dL (ref 1.7–2.4)

## 2023-09-24 NOTE — Plan of Care (Signed)
Problem: Activity: Goal: Risk for activity intolerance will decrease Outcome: Progressing   Problem: Elimination: Goal: Will not experience complications related to bowel motility Outcome: Progressing   Problem: Pain Management: Goal: General experience of comfort will improve Outcome: Progressing

## 2023-09-24 NOTE — TOC Progression Note (Signed)
Transition of Care [facility]) - Progression Note    Patient Details  Name: Sheryl Silva MRN: [id number] Date of Birth: Mar 17, 1976  Transition of Care [facility]) CM/SW Contact  Liliana Cline, LCSW Phone Number: 09/24/2023, 9:19 AM  Clinical Narrative:    Checked with MD, per MD, called for peer to peer yesterday afternoon and was told would receive a call back Friday before 5 pm or Monday. MD states no call back was received Friday. Peer to peer to be completed Monday when they call back.        Expected Discharge Plan and Services                                               Social Determinants of Health (SDOH) Interventions SDOH Screenings   Food Insecurity: No Food Insecurity (09/09/2023)  Recent Concern: Food Insecurity - Food Insecurity Present (07/22/2023)  Housing: Low Risk  (09/09/2023)  Transportation Needs: No Transportation Needs (09/09/2023)  Recent Concern: Transportation Needs - Unmet Transportation Needs (07/22/2023)  Utilities: Not At Risk (09/09/2023)  Alcohol Screen: Low Risk  (02/15/2023)  Depression (PHQ2-9): Low Risk  (02/14/2023)  Financial Resource Strain: Low Risk  (05/09/2023)   Received from [facility], [facility]  Recent Concern: Financial Resource Strain - Medium Risk (02/15/2023)  Physical Activity: Inactive (02/15/2023)  Social Connections: Socially Isolated (02/15/2023)  Stress: Stress Concern Present (02/15/2023)  Tobacco Use: Medium Risk (09/08/2023)    Readmission Risk Interventions    09/10/2023    1:52 PM 08/22/2023   10:03 PM 08/04/2023    8:47 AM  Readmission Risk Prevention Plan  Transportation Screening Complete Complete   PCP or Specialist Appt within 3-5 Days Complete Complete Complete  HRI or Home Care Consult Complete Complete   Social Work Consult for Recovery Care Planning/Counseling Complete --   Palliative Care Screening Not Applicable Not Applicable   Medication Review Furniture conservator/restorer) Complete Complete

## 2023-09-24 NOTE — Progress Notes (Signed)
[facility]  PROGRESS NOTE   Subjective:   Patient seen at bedside comfortable.  Sodium has improved to 129 today.  Objective:  Vital signs: Blood pressure 124/87, pulse (!) 117, temperature 98.6 F (37 C), temperature source Oral, resp. rate 16, height 5\' 6"  (1.676 m), weight 66.8 kg, SpO2 98%.  Intake/Output Summary (Last 24 hours) at 09/24/2023 1522 Last data filed at 09/24/2023 0356 Gross per 24 hour  Intake --  Output 120 ml  Net -120 ml   Filed Weights   09/22/23 0324 09/23/23 0218 09/24/23 0417  Weight: 70.7 kg 69.1 kg 66.8 kg     Physical Exam: General:  No acute distress  Head:  Normocephalic, atraumatic. Moist oral mucosal membranes  Eyes:  Anicteric  Neck:  Supple  Lungs:   Clear to auscultation, normal effort  Heart:  S1S2 no rubs  Abdomen:   Soft, nontender, bowel sounds present  Extremities:  peripheral edema.  Neurologic:  Awake, alert, following commands  Skin:  No lesions  Access:     Basic Metabolic Panel: Recent Labs  Lab 09/20/23 0229 09/20/23 0835 09/21/23 0449 09/21/23 1416 09/22/23 0454 09/23/23 0623 09/24/23 0443  NA 122*   < > 122* 123* 126* 128* 129*  K 3.8  --  3.4*  --  3.3* 3.3* 3.5  CL 95*  --  95*  --  94* 96* 98  CO2 20*  --  21*  --  21* 21* 25  GLUCOSE 98  --  96  --  85 83 128*  BUN 10  --  9  --  9 9 11   CREATININE 0.48  --  0.52  --  0.45 0.59 0.38*  CALCIUM 7.7*  --  7.7*  --  8.1* 8.1* 8.1*  MG  --   --   --   --  1.9  --  1.8  PHOS  --   --   --   --   --   --  2.5   < > = values in this interval not displayed.   GFR: Estimated Creatinine Clearance: 80.5 mL/min (A) (by C-G formula based on SCr of 0.38 mg/dL (L)).  Liver Function Tests: No results for input(s): "AST", "ALT", "ALKPHOS", "BILITOT", "PROT", "ALBUMIN" in the last 168 hours. No results for input(s): "LIPASE", "AMYLASE" in the last 168 hours. No results for input(s): "AMMONIA" in the last 168 hours.  CBC: Recent Labs  Lab  09/18/23 0604 09/19/23 0445 09/20/23 0229 09/20/23 1424 09/21/23 0449 09/24/23 0443  WBC 7.0 6.1 5.2  --  6.1 7.1  HGB 7.3* 7.3* 6.8* 8.5* 8.4* 8.0*  HCT 22.5* 21.8* 20.4* 25.4* 24.7* 23.9*  MCV 91.5 88.3 90.7  --  87.0 88.8  PLT 64* 65* 66*  --  55* 67*     HbA1C: No results found for: "HGBA1C"  Urinalysis: No results for input(s): "COLORURINE", "LABSPEC", "PHURINE", "GLUCOSEU", "HGBUR", "BILIRUBINUR", "KETONESUR", "PROTEINUR", "UROBILINOGEN", "NITRITE", "LEUKOCYTESUR" in the last 72 hours.  Invalid input(s): "APPERANCEUR"    Imaging: US ARTERIAL ABI (SCREENING LOWER EXTREMITY)  Result Date: 09/24/2023 CLINICAL DATA:  47 year old female with left toe pain. EXAM: NONINVASIVE PHYSIOLOGIC VASCULAR STUDY OF BILATERAL LOWER EXTREMITIES TECHNIQUE: Evaluation of both lower extremities were performed at rest, including calculation of ankle-brachial indices with single level Doppler, pressure and pulse volume recording. COMPARISON:  None Available. FINDINGS: Right ABI:  0.97 Left ABI:  0.60 Right Lower Extremity:  Normal arterial waveforms at the ankle. Left Lower Extremity:  Monophasic arterial  waveforms at the ankle. IMPRESSION: 1. Left ankle-brachial index of 0.60, consistent with moderate arterial occlusive disease. 2. Normal right ankle-brachial index. Marliss Coots, MD [facility] Electronically Signed   By: Marliss Coots M.D.   On: 09/24/2023 07:23     Medications:    ondansetron (ZOFRAN) IV 8 mg (09/22/23 1307)    sodium chloride   Intravenous Once   Chlorhexidine Gluconate Cloth  6 each Topical Daily   DULoxetine  60 mg Oral Daily   enoxaparin (LOVENOX) injection  40 mg Subcutaneous Q24H   feeding supplement  237 mL Oral TID BM   fentaNYL  1 patch Transdermal Q72H   furosemide  20 mg Oral Daily   gabapentin  600 mg Oral TID   hydrocortisone cream   Topical BID   metoprolol tartrate  25 mg Oral BID   multivitamin  with minerals  1 tablet Oral Daily   pantoprazole  40 mg Oral BID   senna  1 tablet Oral Daily   sodium chloride flush  10-40 mL Intracatheter Q12H   sodium chloride flush  3 mL Intravenous Q12H   sodium chloride flush  5 mL Intracatheter Q8H   sodium chloride  1 g Oral TID WC    Assessment/ Plan:     47 y.o. female  with a PMHx of stage IV metastatic adenocarcinoma of GI source, hypertension, chronic pain syndrome, anxiety/depression, GERD, history of cholecystitis, CMV colitis, who was admitted to [facility] on 09/08/2023 for evaluation of generalized abdominal pain, nausea, and decreased appetite.   #1: Hyponatremia: Sodium has improved to 129 now.  Continue oral sodium chloride supplementation. Hyponatremia is most likely secondary to SIADH.  Continue supportive care.  Maintain fluid restriction at 1500 cc daily.   Labs and medications reviewed. Will continue to follow along with you.   LOS: 15 Lorain Childes, MD [facility] 11/23/20243:22 PM

## 2023-09-24 NOTE — Progress Notes (Signed)
PROGRESS NOTE    Sheryl Silva  ZOX:[id number] DOB: 1976/01/04 DOA: 09/08/2023 PCP: [name], MD  201A/201A-AA  LOS: 15 days   Brief hospital course: Sheryl Silva is a 47 y.o. 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  Also with recent hospitalization for CMV colitis and has been managed on valganciclovir by ID presented to the emergency room with abnormal labs, generalized abdominal pain, nausea, poor appetite.   S/p chole drain placement.  Worsening abdominal pain difficult to control, she was requesting IV pain meds (although she hadn't maximized her home oxycodone), later started on Dilaudid PCA which has since weaned off.  Palliative care was consulted.  Pt had to be transferred to stepdown for 3% for hyponatremia of 119. Nephro was consulted and pt is currently on NaCl tabs and fluid restriction as per nephro.   Assessment & Plan:   Acute cholecystitis  S/p chole tube Concern of acute cholecystitis with history of stage IV signet cell adenocarcinoma, general surgery was consulted but she is not a good candidate for any surgical intervention. S/p cholecystostomy drain placement by IR on 11/8, draining bile, apparently cultures were not sent so they were ordered next day.  Improving transaminitis. --completed 14 days of zosyn Plan: --cont drain management.  Persistent pain left groin pain likely due to ovarian mass Complaining of worsening pain was started on PCA dilaudid on 11/9, however, pain does not appear to be from upper abdomen.  possibly due to left adnexal mass that caused torsion and cut off blood supply to ovary.  US pelvic with  doppler, which showed known left ovary mass with reduced blood flow. --Onc palliative care consulted, started on Fentanyl patch.  Weaned off PCA dilaudid --Gyn consulted and rec GynOnc to see pt Plan: --cont Fentanyl patch, gabapentin and oxycodone PRN --pain regimen per onc palliative care provider   Stage IV metastatic signet ring adenocarcinoma: --pain management per palliative care provider   H/O CMV colitis: Pt was on valganciclovir.  Most recent CMV DNA from 09/08/23 was neg.  Pt saw ID Dr. Rivka Safer on 09/08/23, and rec holding ganciclovir while not on chemo.  But if she plans to start chemo she will have to be on suppressive therapy with 900mg  VALGanciclovir every day.  Anemia: --s/p 2u pRBC so far --monitor Hgb and transfuse to keep Hgb >7  Hyponatremia 2/2 SIADH --persistent since Aug 2024, nadir-ed to 119 on 09/18/23.  Did improve with 3% saline, however, Na dropped back down to 122 after stopping 3% saline. --Cannot give tolvaptan given transaminitis.  --Na improving on current regimen Plan: --cont salt tab --lasix 20 mg daily --cont fluid restriction 1500 ml daily  Constipation, resolved  Hypokalemia --monitor and supplement PRN  Painful and blackened left 2nd toe --discussed with vascular surgery NP --ABI showed Left ankle-brachial index of 0.60, consistent with moderate arterial occlusive disease. --vascular surgery consult  Elevated temp --has been having tempt around 100 for the past couple of days, not quite a fever. --blood cx today --check covid --avoid scheduled tylenol   DVT prophylaxis: Lovenox SQ Code Status: Full code  Family Communication:  Level of care: Telemetry Medical Dispo:   The patient is from: home Anticipated d/c is to: SNF rehab Anticipated d/c date is: 1-2 days  Subjective and Interval History:  Another tempt of 100.1 early this morning.  Pt reported pain fairly controlled today.   Objective: Vitals:   09/24/23 0340  09/24/23 0417 09/24/23 0925 09/24/23 1611  BP: 134/85  124/87 117/85  Pulse: (!) 106  (!) 117 99  Resp: 20  16 16   Temp: 100.1 F (37.8 C)  98.6 F (37 C) 97.7 F (36.5 C)  TempSrc: Oral  Oral Oral  SpO2: 99%  98% 90%  Weight:  66.8 kg    Height:        Intake/Output Summary (Last 24 hours) at 09/24/2023 1857 Last data filed at 09/24/2023 1821 Gross per 24 hour  Intake 5 ml  Output 170 ml  Net -165 ml   Filed Weights   09/22/23 0324 09/23/23 0218 09/24/23 0417  Weight: 70.7 kg 69.1 kg 66.8 kg    Examination:   Constitutional: NAD, AAOx3 HEENT: conjunctivae and lids normal, EOMI CV: No cyanosis.   RESP: normal respiratory effort, on RA Neuro: II - XII grossly intact.   Psych: depressed mood and affect.     Data Reviewed: I have personally reviewed labs and imaging studies  Time spent: 35 minutes  Darlin Priestly, MD [facility] If 7PM-7AM, please contact night-coverage 09/24/2023, 6:57 PM

## 2023-09-25 DIAGNOSIS — G893 Neoplasm related pain (acute) (chronic): Secondary | ICD-10-CM | POA: Diagnosis not present

## 2023-09-25 NOTE — Plan of Care (Signed)

## 2023-09-25 NOTE — Progress Notes (Signed)
[facility]  PROGRESS NOTE   Subjective:   Patient seen at bedside feeling better.  Afebrile.  Objective:  Vital signs: Blood pressure 122/85, pulse (!) 103, temperature 98.9 F (37.2 C), temperature source Oral, resp. rate 16, height 5\' 6"  (1.676 m), weight 71.2 kg, SpO2 90%.  Intake/Output Summary (Last 24 hours) at 09/25/2023 1438 Last data filed at 09/25/2023 0850 Gross per 24 hour  Intake 10 ml  Output 200 ml  Net -190 ml   Filed Weights   09/23/23 0218 09/24/23 0417 09/25/23 0254  Weight: 69.1 kg 66.8 kg 71.2 kg     Physical Exam: General:  No acute distress  Head:  Normocephalic, atraumatic. Moist oral mucosal membranes  Eyes:  Anicteric  Neck:  Supple  Lungs:   Clear to auscultation, normal effort  Heart:  S1S2 no rubs  Abdomen:   Soft, nontender, bowel sounds present  Extremities:  peripheral edema.  Neurologic:  Awake, alert, following commands  Skin:  No lesions  Access:     Basic Metabolic Panel: Recent Labs  Lab 09/20/23 0229 09/20/23 0835 09/21/23 0449 09/21/23 1416 09/22/23 0454 09/23/23 0623 09/24/23 0443  NA 122*   < > 122* 123* 126* 128* 129*  K 3.8  --  3.4*  --  3.3* 3.3* 3.5  CL 95*  --  95*  --  94* 96* 98  CO2 20*  --  21*  --  21* 21* 25  GLUCOSE 98  --  96  --  85 83 128*  BUN 10  --  9  --  9 9 11   CREATININE 0.48  --  0.52  --  0.45 0.59 0.38*  CALCIUM 7.7*  --  7.7*  --  8.1* 8.1* 8.1*  MG  --   --   --   --  1.9  --  1.8  PHOS  --   --   --   --   --   --  2.5   < > = values in this interval not displayed.   GFR: Estimated Creatinine Clearance: 87 mL/min (A) (by C-G formula based on SCr of 0.38 mg/dL (L)).  Liver Function Tests: No results for input(s): "AST", "ALT", "ALKPHOS", "BILITOT", "PROT", "ALBUMIN" in the last 168 hours. No results for input(s): "LIPASE", "AMYLASE" in the last 168 hours. No results for input(s): "AMMONIA" in the last 168 hours.  CBC: Recent Labs  Lab 09/19/23 0445 09/20/23 0229  09/20/23 1424 09/21/23 0449 09/24/23 0443  WBC 6.1 5.2  --  6.1 7.1  HGB 7.3* 6.8* 8.5* 8.4* 8.0*  HCT 21.8* 20.4* 25.4* 24.7* 23.9*  MCV 88.3 90.7  --  87.0 88.8  PLT 65* 66*  --  55* 67*     HbA1C: No results found for: "HGBA1C"  Urinalysis: No results for input(s): "COLORURINE", "LABSPEC", "PHURINE", "GLUCOSEU", "HGBUR", "BILIRUBINUR", "KETONESUR", "PROTEINUR", "UROBILINOGEN", "NITRITE", "LEUKOCYTESUR" in the last 72 hours.  Invalid input(s): "APPERANCEUR"    Imaging: US ARTERIAL ABI (SCREENING LOWER EXTREMITY)  Result Date: 09/24/2023 CLINICAL DATA:  47 year old female with left toe pain. EXAM: NONINVASIVE PHYSIOLOGIC VASCULAR STUDY OF BILATERAL LOWER EXTREMITIES TECHNIQUE: Evaluation of both lower extremities were performed at rest, including calculation of ankle-brachial indices with single level Doppler, pressure and pulse volume recording. COMPARISON:  None Available. FINDINGS: Right ABI:  0.97 Left ABI:  0.60 Right Lower Extremity:  Normal arterial waveforms at the ankle. Left Lower Extremity:  Monophasic arterial waveforms at the ankle. IMPRESSION: 1. Left ankle-brachial index of  0.60, consistent with moderate arterial occlusive disease. 2. Normal right ankle-brachial index. Marliss Coots, MD [facility] Electronically Signed   By: Marliss Coots M.D.   On: 09/24/2023 07:23     Medications:    ondansetron (ZOFRAN) IV 8 mg (09/22/23 1307)    sodium chloride   Intravenous Once   Chlorhexidine Gluconate Cloth  6 each Topical Daily   DULoxetine  60 mg Oral Daily   enoxaparin (LOVENOX) injection  40 mg Subcutaneous Q24H   feeding supplement  237 mL Oral TID BM   fentaNYL  1 patch Transdermal Q72H   furosemide  20 mg Oral Daily   gabapentin  600 mg Oral TID   hydrocortisone cream   Topical BID   metoprolol tartrate  25 mg Oral BID   multivitamin with minerals  1 tablet Oral Daily   pantoprazole  40 mg Oral BID    senna  1 tablet Oral Daily   sodium chloride flush  10-40 mL Intracatheter Q12H   sodium chloride flush  3 mL Intravenous Q12H   sodium chloride flush  5 mL Intracatheter Q8H   sodium chloride  1 g Oral TID WC    Assessment/ Plan:     47 y.o. 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    #1: Hyponatremia: Sodium has improved to 129 now.  Continue oral sodium chloride supplementation. Hyponatremia is most likely secondary to SIADH.  Continue supportive care.  Maintain fluid restriction at 1500 cc daily.  #2: Acute cholecystitis: Status post cholecystotomy.  #3: Metastatic signet ring adenocarcinoma: Palliative care.  #4: Anemia: Status post 2 units of PRBC so far.  Labs  and medications reviewed. Will continue to follow along with you.   LOS: 16 Lorain Childes, MD [facility] 11/24/20242:38 PM

## 2023-09-25 NOTE — Progress Notes (Signed)
PROGRESS NOTE    Sheryl Silva  QVZ:[id number] DOB: [birth year] DOA: 09/08/2023 PCP: [name], MD  [id number]  LOS: 16 days   Brief hospital course: Sheryl Silva is a 47 y.o. 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  Also with recent hospitalization for CMV colitis and has been managed on valganciclovir by ID presented to the emergency room with abnormal labs, generalized abdominal pain, nausea, poor appetite.   S/p chole drain placement.  Worsening abdominal pain difficult to control, she was requesting IV pain meds (although she hadn't maximized her home oxycodone), later started on Dilaudid PCA which has since weaned off.  Palliative care was consulted.  Pt had to be transferred to stepdown for 3% for hyponatremia of 119. Nephro was consulted and pt is currently on NaCl tabs and fluid restriction as per nephro.   Assessment & Plan:   Acute cholecystitis  S/p chole tube Concern of acute cholecystitis with history of stage IV signet cell adenocarcinoma, general surgery was consulted but she is not a good candidate for any surgical intervention. S/p cholecystostomy drain placement by IR on 11/8, draining bile, apparently cultures were not sent so they were ordered next day.  Improving transaminitis. --completed 14 days of zosyn Plan: --cont drain management.  Persistent pain left groin pain likely due to ovarian mass Complaining of worsening pain was started on PCA dilaudid on 11/9, however, pain does not appear to be from upper abdomen.  possibly due to left adnexal mass that caused torsion and cut off blood supply to ovary.  US pelvic with  doppler, which showed known left ovary mass with reduced blood flow. --Onc palliative care consulted, started on Fentanyl patch.  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  Most recent CMV DNA from 09/08/23 was neg.  Pt saw ID Dr. Rivka Safer on 09/08/23, and rec holding ganciclovir while not on chemo.  But if she plans to start chemo she will have to be on suppressive therapy with 900mg  VALGanciclovir every day.  Anemia: --s/p 2u pRBC so far --monitor Hgb and transfuse to keep Hgb >7  Hyponatremia 2/2 SIADH --persistent since Aug 2024, nadir-ed to 119 on 09/18/23.  Did improve with 3% saline, however, Na dropped back down to 122 after stopping 3% saline. --Cannot give tolvaptan given transaminitis.  --Na improving on current regimen Plan: --cont salt tab --lasix 20 mg daily --cont fluid restriction 1500 ml daily  Constipation, resolved  Hypokalemia --monitor and supplement PRN  Painful and blackened left 2nd toe --discussed with vascular surgery NP --ABI showed Left ankle-brachial index of 0.60, consistent with moderate arterial occlusive disease. --vascular surgery consult  Elevated temp --has been having tempt around 100 for the past couple of days, not quite a fever. --blood cx obtained.  Covid neg Plan: --avoid scheduled tylenol in order to monitor for fever   DVT prophylaxis: Lovenox SQ Code Status: Full code  Family Communication:  Level of care: Telemetry Medical Dispo:   The patient is from: home Anticipated d/c is  to: SNF rehab Anticipated d/c date is: 1-2 days   Subjective and Interval History:  Pt reported pain controlled since she just received pain med.  Reported not eating much  but drank her Ensure.   Objective: Vitals:   09/25/23 0451 09/25/23 0830 09/25/23 1336 09/25/23 1844  BP: 130/78 129/83 122/85 123/80  Pulse: (!) 120 (!) 124 (!) 103 (!) 115  Resp: 18 16 16 16   Temp: 99.3 F (37.4 C) 99.7 F (37.6 C) 98.9 F (37.2 C) 99.2 F (37.3 C)  TempSrc:  Oral Oral Oral  SpO2: 100% 100% 90% (!) 85%  Weight:      Height:        Intake/Output Summary (Last 24 hours) at 09/25/2023 2036 Last data filed at 09/25/2023 0850 Gross per 24 hour  Intake 5 ml  Output 150 ml  Net -145 ml   Filed Weights   09/23/23 0218 09/24/23 0417 09/25/23 0254  Weight: 69.1 kg 66.8 kg 71.2 kg    Examination:   Constitutional: NAD, AAOx3 HEENT: conjunctivae and lids normal, EOMI CV: No cyanosis.   RESP: normal respiratory effort, on RA Neuro: II - XII grossly intact.   Psych: depressed mood and affect.     Data Reviewed: I have personally reviewed labs and imaging studies  Time spent: 35 minutes  Darlin Priestly, MD [facility] If 7PM-7AM, please contact night-coverage 09/25/2023, 8:36 PM

## 2023-09-26 ENCOUNTER — Ambulatory Visit: Payer: 59 | Admitting: Oncology

## 2023-09-26 ENCOUNTER — Ambulatory Visit: Payer: 59

## 2023-09-26 ENCOUNTER — Other Ambulatory Visit: Payer: 59

## 2023-09-26 DIAGNOSIS — I70262 Atherosclerosis of native arteries of extremities with gangrene, left leg: Secondary | ICD-10-CM

## 2023-09-26 DIAGNOSIS — I739 Peripheral vascular disease, unspecified: Secondary | ICD-10-CM | POA: Diagnosis not present

## 2023-09-26 DIAGNOSIS — G893 Neoplasm related pain (acute) (chronic): Secondary | ICD-10-CM | POA: Diagnosis not present

## 2023-09-26 LAB — MAGNESIUM: Magnesium: 1.8 mg/dL (ref 1.7–2.4)

## 2023-09-26 LAB — CBC
HCT: 22.9 % — ABNORMAL LOW (ref 36.0–46.0)
Hemoglobin: 7.4 g/dL — ABNORMAL LOW (ref 12.0–15.0)
MCH: 29.1 pg (ref 26.0–34.0)
MCHC: 32.3 g/dL (ref 30.0–36.0)
MCV: 90.2 fL (ref 80.0–100.0)
Platelets: 48 10*3/uL — ABNORMAL LOW (ref 150–400)
RBC: 2.54 MIL/uL — ABNORMAL LOW (ref 3.87–5.11)
RDW: 17.7 % — ABNORMAL HIGH (ref 11.5–15.5)
WBC: 8.5 10*3/uL (ref 4.0–10.5)
nRBC: 4.8 % — ABNORMAL HIGH (ref 0.0–0.2)

## 2023-09-26 LAB — BASIC METABOLIC PANEL
Anion gap: 9 (ref 5–15)
BUN: 13 mg/dL (ref 6–20)
CO2: 25 mmol/L (ref 22–32)
Calcium: 8 mg/dL — ABNORMAL LOW (ref 8.9–10.3)
Chloride: 97 mmol/L — ABNORMAL LOW (ref 98–111)
Creatinine, Ser: 0.43 mg/dL — ABNORMAL LOW (ref 0.44–1.00)
GFR, Estimated: >60 mL/min (ref >60)
Glucose, Bld: 100 mg/dL — ABNORMAL HIGH (ref 70–99)
Potassium: 4 mmol/L (ref 3.5–5.1)
Sodium: 131 mmol/L — ABNORMAL LOW (ref 135–145)

## 2023-09-26 MED ORDER — FENTANYL 100 MCG/HR TD PT72
1.0000 | MEDICATED_PATCH | TRANSDERMAL | Status: DC
Start: 2023-09-28 — End: 2023-09-28

## 2023-09-26 NOTE — TOC Progression Note (Signed)
Transition of Care [facility]) - Progression Note    Patient Details  Name: Sheryl Silva MRN: [id number] Date of Birth: 02/21/1976  Transition of Care [facility]) CM/SW Contact  Chapman Fitch, RN Phone Number: 09/26/2023, 3:54 PM  Clinical Narrative:     Per MD she spoke with insurance in regards to peer to peer.  She states that the provider told her that they are in agreement that patient needs SNF, and that when patient is medically ready for discharge for an expedited appeal to be completed  Plan for angiogram tomorrow        Expected Discharge Plan and Services                                               Social Determinants of Health (SDOH) Interventions SDOH Screenings   Food Insecurity: No Food Insecurity (09/09/2023)  Recent Concern: Food Insecurity - Food Insecurity Present (07/22/2023)  Housing: Low Risk  (09/09/2023)  Transportation Needs: No Transportation Needs (09/09/2023)  Recent Concern: Transportation Needs - Unmet Transportation Needs (07/22/2023)  Utilities: Not At Risk (09/09/2023)  Alcohol Screen: Low Risk  (02/15/2023)  Depression (PHQ2-9): Low Risk  (02/14/2023)  Financial Resource Strain: Low Risk  (05/09/2023)   Received from [facility], [facility]  Recent Concern: Financial Resource Strain - Medium Risk (02/15/2023)  Physical Activity: Inactive (02/15/2023)  Social Connections: Socially Isolated (02/15/2023)  Stress: Stress Concern Present (02/15/2023)  Tobacco Use: Medium Risk (09/08/2023)    Readmission Risk Interventions    09/10/2023    1:52 PM 08/22/2023   10:03 PM 08/04/2023    8:47 AM  Readmission Risk Prevention Plan  Transportation Screening Complete Complete   PCP or Specialist Appt within 3-5 Days Complete Complete Complete  HRI or Home Care Consult Complete Complete   Social Work Consult for Recovery Care Planning/Counseling Complete --   Palliative Care Screening Not Applicable Not  Applicable   Medication Review Oceanographer) Complete Complete
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
## 2023-09-26 NOTE — Progress Notes (Signed)
[facility]  PROGRESS NOTE   Subjective:   Sodium 131  Objective:  Vital signs: Blood pressure (!) 129/95, pulse (!) 116, temperature 98.9 F (37.2 C), temperature source Oral, resp. rate 18, height 5\' 6"  (1.676 m), weight 72.3 kg, SpO2 100%.  Intake/Output Summary (Last 24 hours) at 09/26/2023 1219 Last data filed at 09/26/2023 0935 Gross per 24 hour  Intake 410 ml  Output 40 ml  Net 370 ml   Filed Weights   09/24/23 0417 09/25/23 0254 09/26/23 0500  Weight: 66.8 kg 71.2 kg 72.3 kg     Physical Exam: General:  No acute distress  Head:  Normocephalic, atraumatic. Moist oral mucosal membranes  Eyes:  Anicteric  Lungs:   Clear to auscultation, normal effort  Heart:  S1S2 no rubs  Abdomen:   Soft, nontender, bowel sounds present  Extremities:  No peripheral edema.  Neurologic:  Awake, alert, following commands  Skin:  No lesions  Access: None    Basic Metabolic Panel: Recent Labs  Lab 09/21/23 0449 09/21/23 1416 09/22/23 0454 09/23/23 0623 09/24/23 0443 09/26/23 0538  NA 122* 123* 126* 128* 129* 131*  K 3.4*  --  3.3* 3.3* 3.5 4.0  CL 95*  --  94* 96* 98 97*  CO2 21*  --  21* 21* 25 25  GLUCOSE 96  --  85 83 128* 100*  BUN 9  --  9 9 11 13   CREATININE 0.52  --  0.45 0.59 0.38* 0.43*  CALCIUM 7.7*  --  8.1* 8.1* 8.1* 8.0*  MG  --   --  1.9  --  1.8 1.8  PHOS  --   --   --   --  2.5  --    GFR: Estimated Creatinine Clearance: 87.6 mL/min (A) (by C-G formula based on SCr of 0.43 mg/dL (L)).  Liver Function Tests: No results for input(s): "AST", "ALT", "ALKPHOS", "BILITOT", "PROT", "ALBUMIN" in the last 168 hours. No results for input(s): "LIPASE", "AMYLASE" in the last 168 hours. No results for input(s): "AMMONIA" in the last 168 hours.  CBC: Recent Labs  Lab 09/20/23 0229 09/20/23 1424 09/21/23 0449 09/24/23 0443 09/26/23 0538  WBC 5.2  --  6.1 7.1 8.5  HGB 6.8* 8.5* 8.4* 8.0* 7.4*  HCT 20.4* 25.4* 24.7* 23.9* 22.9*  MCV 90.7  --   87.0 88.8 90.2  PLT 66*  --  55* 67* 48*     HbA1C: No results found for: "HGBA1C"  Urinalysis: No results for input(s): "COLORURINE", "LABSPEC", "PHURINE", "GLUCOSEU", "HGBUR", "BILIRUBINUR", "KETONESUR", "PROTEINUR", "UROBILINOGEN", "NITRITE", "LEUKOCYTESUR" in the last 72 hours.  Invalid input(s): "APPERANCEUR"    Imaging: No results found.   Medications:    ondansetron (ZOFRAN) IV 8 mg (09/22/23 1307)    Chlorhexidine Gluconate Cloth  6 each Topical Daily   DULoxetine  60 mg Oral Daily   enoxaparin (LOVENOX) injection  40 mg Subcutaneous Q24H   feeding supplement  237 mL Oral TID BM   fentaNYL  1 patch Transdermal Q72H   furosemide  20 mg Oral Daily   gabapentin  600 mg Oral TID   hydrocortisone cream   Topical BID   metoprolol tartrate  25 mg Oral BID   multivitamin with minerals  1 tablet Oral Daily   pantoprazole  40 mg Oral BID   senna  1 tablet Oral Daily   sodium chloride flush  10-40 mL Intracatheter Q12H   sodium chloride flush  3 mL Intravenous Q12H   sodium  chloride flush  5 mL Intracatheter Q8H   sodium chloride  1 g Oral TID WC    Assessment/ Plan:     47 y.o. 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    #1: Hyponatremia: Hyponatremia is most likely secondary to SIADH.  Sodium corrected to 131. Continue oral sodium chloride supplementation with fluid restriction.  #2: Acute cholecystitis: Status post cholecystotomy.  #3: Metastatic signet ring adenocarcinoma: Palliative care.following  #4: Anemia: Status post 2 units of PRBC. Hgb 7.4.  Due to corrected sodium levels, we will sign off at this time.    LOS: 17 [facility] 11/25/202412:19 PM

## 2023-09-26 NOTE — Plan of Care (Signed)
Problem: Activity: Goal: Risk for activity intolerance will decrease Outcome: Progressing   Problem: Nutrition: Goal: Adequate nutrition will be maintained Outcome: Progressing   Problem: Coping: Goal: Level of anxiety will decrease Outcome: Progressing   Problem: Pain Management: Goal: General experience of comfort will improve Outcome: Progressing   Problem: Safety: Goal: Ability to remain free from injury will improve Outcome: Progressing   Problem: Skin Integrity: Goal: Risk for impaired skin integrity will decrease Outcome: Progressing
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
## 2023-09-26 NOTE — TOC Progression Note (Signed)
Transition of Care [facility]) - Progression Note    Patient Details  Name: Sheryl Silva MRN: [id number] Date of Birth: 08-Jan-1976  Transition of Care [facility]) CM/SW Contact  Chapman Fitch, RN Phone Number: 09/26/2023, 9:37 AM  Clinical Narrative:     MD to notify [facility] when peer to peer completed, so TOC can update facility       Expected Discharge Plan and Services                                               Social Determinants of Health (SDOH) Interventions SDOH Screenings   Food Insecurity: No Food Insecurity (09/09/2023)  Recent Concern: Food Insecurity - Food Insecurity Present (07/22/2023)  Housing: Low Risk  (09/09/2023)  Transportation Needs: No Transportation Needs (09/09/2023)  Recent Concern: Transportation Needs - Unmet Transportation Needs (07/22/2023)  Utilities: Not At Risk (09/09/2023)  Alcohol Screen: Low Risk  (02/15/2023)  Depression (PHQ2-9): Low Risk  (02/14/2023)  Financial Resource Strain: Low Risk  (05/09/2023)   Received from [facility], [facility]  Recent Concern: Financial Resource Strain - Medium Risk (02/15/2023)  Physical Activity: Inactive (02/15/2023)  Social Connections: Socially Isolated (02/15/2023)  Stress: Stress Concern Present (02/15/2023)  Tobacco Use: Medium Risk (09/08/2023)    Readmission Risk Interventions    09/10/2023    1:52 PM 08/22/2023   10:03 PM 08/04/2023    8:47 AM  Readmission Risk Prevention Plan  Transportation Screening Complete Complete   PCP or Specialist Appt within 3-5 Days Complete Complete Complete  HRI or Home Care Consult Complete Complete   Social Work Consult for Recovery Care Planning/Counseling Complete --   Palliative Care Screening Not Applicable Not Applicable   Medication Review Oceanographer) Complete Complete

## 2023-09-26 NOTE — Progress Notes (Signed)
PROGRESS NOTE    Sheryl Silva  UEA:[id number] DOB: 1976/11/01 DOA: 09/08/2023 PCP: [name], MD  [id number]-[id number]  LOS: 17 days   Brief hospital course: Sheryl Silva is a 47 y.o. 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  Also with recent hospitalization for CMV colitis and has been managed on valganciclovir by ID presented to the emergency room with abnormal labs, generalized abdominal pain, nausea, poor appetite.   S/p chole drain placement.  Worsening abdominal pain difficult to control, she was requesting IV pain meds (although she hadn't maximized her home oxycodone), later started on Dilaudid PCA which has since weaned off.  Palliative care was consulted.  Pt had to be transferred to stepdown for 3% for hyponatremia of 119. Nephro was consulted and pt is currently on NaCl tabs and fluid restriction as per nephro.   Assessment & Plan:   Acute cholecystitis  S/p chole tube Concern of acute cholecystitis with history of stage IV signet cell adenocarcinoma, general surgery was consulted but she is not a good candidate for any surgical intervention. S/p cholecystostomy drain placement by IR on 11/8, draining bile, apparently cultures were not sent so they were ordered next day.  Improving transaminitis. --completed 14 days of zosyn Plan: --cont drain management.  Persistent pain left groin pain likely due to ovarian mass Complaining of worsening pain was started on PCA dilaudid on 11/9, however, pain does not appear to be from upper abdomen.  possibly due to left adnexal mass that caused torsion and cut off blood supply to ovary.  US pelvic with  doppler, which showed known left ovary mass with reduced blood flow. --Onc palliative care consulted, started on Fentanyl patch.  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  Most recent CMV DNA from 09/08/23 was neg.  Pt saw ID Dr. Rivka Safer on 09/08/23, and rec holding ganciclovir while not on chemo.  But if she plans to start chemo she will have to be on suppressive therapy with 900mg  VALGanciclovir every day.  Anemia: --s/p 2u pRBC so far --monitor Hgb and transfuse to keep Hgb >7  Hyponatremia 2/2 SIADH --persistent since Aug 2024, nadir-ed to 119 on 09/18/23.  Did improve with 3% saline, however, Na dropped back down to 122 after stopping 3% saline. --Cannot give tolvaptan given transaminitis.  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 --vascular surgery consulted --plan for angiogram tomorrow  Elevated temp --has been having tempt around 100 for the past couple of days, not quite a fever. --blood cx obtained.  Covid neg Plan: --avoid scheduled tylenol in order to monitor for fever  Tachycardia --likely due to demand and cancer --cont Lopressor   DVT prophylaxis: Lovenox SQ Code Status: Full code  Family Communication:  Level of care:  Telemetry Medical Dispo:   The patient is from: home Anticipated d/c is to: SNF rehab Anticipated d/c date is: 1-2 days   Subjective and Interval History:   Had peer to peer this morning.  Pt is approved for SNF rehab once medically stable for discharge.  After discussing with vascular surgery, pt decided to go for angiogram.   Objective: Vitals:   09/26/23 0733 09/26/23 1235 09/26/23 1504 09/26/23 2004  BP: (!) 129/95 (!) 134/92 (!) 139/97 (!) 141/87  Pulse: (!) 116 (!) 104 (!) 110 (!) 128  Resp: 18 18  (!) 24  Temp: 98.9 F (37.2 C) 97.8 F (36.6 C) 97.6 F (36.4 C) 98.7 F (37.1 C)  TempSrc: Oral Oral Oral Oral  SpO2: 100% 100% 100% 99%  Weight:      Height:        Intake/Output Summary (Last 24 hours) at 09/26/2023 2047 Last data filed at 09/26/2023 1713 Gross per 24 hour  Intake 155 ml  Output 115 ml  Net 40 ml   Filed Weights   09/24/23 0417 09/25/23 0254 09/26/23 0500  Weight: 66.8 kg 71.2 kg 72.3 kg    Examination:   Constitutional: NAD, AAOx3 RESP: normal respiratory effort, on RA Neuro: II - XII grossly intact.   Psych: Normal mood and affect.  Appropriate judgement and reason   Data Reviewed: I have personally reviewed labs and imaging studies  Time spent: 35 minutes  Darlin Priestly, MD [facility] If 7PM-7AM, please contact night-coverage 09/26/2023, 8:47 PM

## 2023-09-26 NOTE — Plan of Care (Signed)
  Problem: Education: Goal: Knowledge of General Education information will improve Description: Including pain rating scale, medication(s)/side effects and non-pharmacologic comfort measures 09/26/2023 0402 by Gwenlyn Saran, RN Outcome: Progressing 09/26/2023 0402 by Gwenlyn Saran, RN Outcome: Progressing   Problem: Health Behavior/Discharge Planning: Goal: Ability to manage health-related needs will improve Outcome: Progressing   Problem: Clinical Measurements: Goal: Ability to maintain clinical measurements within normal limits will improve Outcome: Progressing Goal: Will remain free from infection Outcome: Progressing Goal: Diagnostic test results will improve Outcome: Progressing Goal: Respiratory complications will improve Outcome: Progressing Goal: Cardiovascular complication will be avoided Outcome: Progressing   Problem: Activity: Goal: Risk for activity intolerance will decrease Outcome: Progressing   Problem: Nutrition: Goal: Adequate nutrition will be maintained Outcome: Progressing   Problem: Coping: Goal: Level of anxiety will decrease Outcome: Progressing   Problem: Elimination: Goal: Will not experience complications related to bowel motility Outcome: Progressing Goal: Will not experience complications related to urinary retention Outcome: Progressing   Problem: Pain Management: Goal: General experience of comfort will improve Outcome: Progressing   Problem: Safety: Goal: Ability to remain free from injury will improve Outcome: Progressing   Problem: Skin Integrity: Goal: Risk for impaired skin integrity will decrease Outcome: Progressing

## 2023-09-27 ENCOUNTER — Encounter
Admission: EM | Disposition: A | Discharge status: Hospice | Payer: Self-pay | Source: Home / Self Care | Attending: Hospitalist

## 2023-09-27 ENCOUNTER — Encounter: Payer: Self-pay | Admitting: Vascular Surgery

## 2023-09-27 DIAGNOSIS — I70262 Atherosclerosis of native arteries of extremities with gangrene, left leg: Secondary | ICD-10-CM

## 2023-09-27 DIAGNOSIS — I701 Atherosclerosis of renal artery: Secondary | ICD-10-CM

## 2023-09-27 DIAGNOSIS — I70221 Atherosclerosis of native arteries of extremities with rest pain, right leg: Secondary | ICD-10-CM | POA: Diagnosis not present

## 2023-09-27 DIAGNOSIS — G893 Neoplasm related pain (acute) (chronic): Secondary | ICD-10-CM | POA: Diagnosis not present

## 2023-09-27 HISTORY — PX: LOWER EXTREMITY ANGIOGRAPHY: CATH118251

## 2023-09-27 LAB — BASIC METABOLIC PANEL
Anion gap: 10 (ref 5–15)
BUN: 13 mg/dL (ref 6–20)
CO2: 24 mmol/L (ref 22–32)
Calcium: 8.2 mg/dL — ABNORMAL LOW (ref 8.9–10.3)
Chloride: 97 mmol/L — ABNORMAL LOW (ref 98–111)
Creatinine, Ser: 0.48 mg/dL (ref 0.44–1.00)
GFR, Estimated: >60 mL/min (ref >60)
Glucose, Bld: 103 mg/dL — ABNORMAL HIGH (ref 70–99)
Potassium: 4.3 mmol/L (ref 3.5–5.1)
Sodium: 131 mmol/L — ABNORMAL LOW (ref 135–145)

## 2023-09-27 LAB — CBC
HCT: 23.5 % — ABNORMAL LOW (ref 36.0–46.0)
Hemoglobin: 7.6 g/dL — ABNORMAL LOW (ref 12.0–15.0)
MCH: 29.7 pg (ref 26.0–34.0)
MCHC: 32.3 g/dL (ref 30.0–36.0)
MCV: 91.8 fL (ref 80.0–100.0)
Platelets: 46 10*3/uL — ABNORMAL LOW (ref 150–400)
RBC: 2.56 MIL/uL — ABNORMAL LOW (ref 3.87–5.11)
RDW: 17.6 % — ABNORMAL HIGH (ref 11.5–15.5)
WBC: 9.9 10*3/uL (ref 4.0–10.5)
nRBC: 4.7 % — ABNORMAL HIGH (ref 0.0–0.2)

## 2023-09-27 LAB — MAGNESIUM: Magnesium: 1.9 mg/dL (ref 1.7–2.4)

## 2023-09-27 SURGERY — LOWER EXTREMITY ANGIOGRAPHY
Anesthesia: Moderate Sedation | Laterality: Left

## 2023-09-27 MED ORDER — SODIUM CHLORIDE 0.9 % IV SOLN: 250.0000 mL | INTRAVENOUS | Status: AC | PRN

## 2023-09-27 MED ORDER — SODIUM CHLORIDE 0.9% FLUSH
3.0000 mL | Freq: Two times a day (BID) | INTRAVENOUS | Status: DC
Start: 2023-09-27 — End: 2023-10-02
  Administered 2023-09-27 – 2023-10-01 (×5): 3 mL via INTRAVENOUS

## 2023-09-27 MED ORDER — SODIUM CHLORIDE 0.9 % IV SOLN: INTRAVENOUS | Status: AC

## 2023-09-27 MED ORDER — CEFAZOLIN SODIUM-DEXTROSE 2-4 GM/100ML-% IV SOLN
INTRAVENOUS | Status: AC
  Filled 2023-09-27: qty 100

## 2023-09-27 MED ORDER — FENTANYL CITRATE (PF) 100 MCG/2ML IJ SOLN: 12.5000 ug | Freq: Once | INTRAMUSCULAR | Status: DC | PRN

## 2023-09-27 MED ORDER — SODIUM CHLORIDE 0.9 % IV SOLN
INTRAVENOUS | Status: DC
Start: 2023-09-27 — End: 2023-09-27

## 2023-09-27 MED ORDER — HEPARIN SODIUM (PORCINE) 1000 UNIT/ML IJ SOLN
INTRAMUSCULAR | Status: AC
Start: 2023-09-27 — End: ?
  Filled 2023-09-27: qty 10

## 2023-09-27 MED ORDER — HEPARIN SODIUM (PORCINE) 1000 UNIT/ML IJ SOLN
INTRAMUSCULAR | Status: DC | PRN
  Administered 2023-09-27: 3000 [IU] via INTRAVENOUS

## 2023-09-27 MED ORDER — MIDAZOLAM HCL 5 MG/5ML IJ SOLN
INTRAMUSCULAR | Status: AC
  Filled 2023-09-27: qty 5

## 2023-09-27 MED ORDER — HEPARIN (PORCINE) IN NACL 1000-0.9 UT/500ML-% IV SOLN
INTRAVENOUS | Status: DC | PRN
  Administered 2023-09-27 (×2): 500 mL

## 2023-09-27 MED ORDER — MIDAZOLAM HCL 2 MG/2ML IJ SOLN
INTRAMUSCULAR | Status: DC | PRN
  Administered 2023-09-27: 2 mg via INTRAVENOUS
  Administered 2023-09-27: 1 mg via INTRAVENOUS

## 2023-09-27 MED ORDER — FAMOTIDINE 20 MG PO TABS: 40.0000 mg | ORAL_TABLET | Freq: Once | ORAL | Status: DC | PRN

## 2023-09-27 MED ORDER — FENTANYL CITRATE (PF) 100 MCG/2ML IJ SOLN
INTRAMUSCULAR | Status: DC | PRN
  Administered 2023-09-27: 25 ug via INTRAVENOUS
  Administered 2023-09-27: 50 ug via INTRAVENOUS

## 2023-09-27 MED ORDER — FENTANYL CITRATE (PF) 100 MCG/2ML IJ SOLN
INTRAMUSCULAR | Status: AC
  Filled 2023-09-27: qty 2

## 2023-09-27 MED ORDER — MIDAZOLAM HCL 2 MG/ML PO SYRP: 8.0000 mg | ORAL_SOLUTION | Freq: Once | ORAL | Status: DC | PRN

## 2023-09-27 MED ORDER — LIDOCAINE HCL (PF) 1 % IJ SOLN
INTRAMUSCULAR | Status: DC | PRN
  Administered 2023-09-27: 10 mL via INTRADERMAL

## 2023-09-27 MED ORDER — CEFAZOLIN SODIUM-DEXTROSE 2-4 GM/100ML-% IV SOLN
2.0000 g | INTRAVENOUS | Status: AC
  Administered 2023-09-27: 2 g via INTRAVENOUS

## 2023-09-27 MED ORDER — DIPHENHYDRAMINE HCL 50 MG/ML IJ SOLN: 50.0000 mg | Freq: Once | INTRAMUSCULAR | Status: DC | PRN

## 2023-09-27 MED ORDER — METHYLPREDNISOLONE SODIUM SUCC 125 MG IJ SOLR: 125.0000 mg | Freq: Once | INTRAMUSCULAR | Status: DC | PRN

## 2023-09-27 MED ORDER — SODIUM CHLORIDE 0.9% FLUSH: 3.0000 mL | INTRAVENOUS | Status: DC | PRN

## 2023-09-27 MED ORDER — IODIXANOL 320 MG/ML IV SOLN
INTRAVENOUS | Status: DC | PRN
  Administered 2023-09-27: 65 mL via INTRA_ARTERIAL

## 2023-09-27 SURGICAL SUPPLY — 19 items
BALLN LUTONIX DCB 6X60X130 (BALLOONS) ×1 IMPLANT
BALLOON LUTONIX DCB 6X60X130 (BALLOONS) IMPLANT
CATH ANGIO 5F PIGTAIL 65CM (CATHETERS) IMPLANT
COVER PROBE ULTRASOUND 5X96 (MISCELLANEOUS) IMPLANT
DEVICE PRESTO INFLATION (MISCELLANEOUS) IMPLANT
DEVICE STARCLOSE SE CLOSURE (Vascular Products) IMPLANT
GLIDEWIRE ADV .035X260CM (WIRE) IMPLANT
GOWN STRL REUS W/ TWL LRG LVL3 (GOWN DISPOSABLE) ×1 IMPLANT
NDL ENTRY 21GA 7CM ECHOTIP (NEEDLE) IMPLANT
NEEDLE ENTRY 21GA 7CM ECHOTIP (NEEDLE) ×1 IMPLANT
PACK ANGIOGRAPHY (CUSTOM PROCEDURE TRAY) ×1 IMPLANT
SET INTRO CAPELLA COAXIAL (SET/KITS/TRAYS/PACK) IMPLANT
SHEATH BALKIN 6FR (SHEATH) IMPLANT
SHEATH BRITE TIP 5FRX11 (SHEATH) IMPLANT
STENT LIFESTAR 7X40X80 (Permanent Stent) IMPLANT
SUT MNCRL AB 4-0 PS2 18 (SUTURE) IMPLANT
SYR MEDRAD MARK 7 150ML (SYRINGE) IMPLANT
TUBING CONTRAST HIGH PRESS 72 (TUBING) IMPLANT
WIRE GUIDERIGHT .035X150 (WIRE) IMPLANT

## 2023-09-27 NOTE — Interval H&P Note (Signed)
History and Physical Interval Note:  09/27/2023 12:30 PM  Sheryl Silva  has presented today for surgery, with the diagnosis of Pad with Gangrene.  The various methods of treatment have been discussed with the patient and family. After consideration of risks, benefits and other options for treatment, the patient has consented to  Procedure(s): Lower Extremity Angiography (Left) as a surgical intervention.  The patient's history has been reviewed, patient examined, no change in status, stable for surgery.  I have reviewed the patient's chart and labs.  Questions were answered to the patient's satisfaction.     [name]

## 2023-09-27 NOTE — Progress Notes (Signed)
PROGRESS NOTE    Sheryl Silva  ZOX:[id number] DOB: [birth year] DOA: 09/08/2023 PCP: [name], MD  201A/201A-AA  LOS: 18 days   Brief hospital course: Mercedez Roundtree is a 47 y.o. 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  Also with recent hospitalization for CMV colitis and has been managed on valganciclovir by ID presented to the emergency room with abnormal labs, generalized abdominal pain, nausea, poor appetite.   S/p chole drain placement.  Worsening abdominal pain difficult to control, she was requesting IV pain meds (although she hadn't maximized her home oxycodone), later started on Dilaudid PCA which has since weaned off.  Palliative care was consulted.  Pt had to be transferred to stepdown for 3% for hyponatremia of 119. Nephro was consulted and pt is currently on NaCl tabs and fluid restriction as per nephro.   Assessment & Plan:   Acute cholecystitis  S/p chole tube Concern of acute cholecystitis with history of stage IV signet cell adenocarcinoma, general surgery was consulted but she is not a good candidate for any surgical intervention. S/p cholecystostomy drain placement by IR on 11/8, draining bile, apparently cultures were not sent so they were ordered next day.  Improving transaminitis. --completed 14 days of zosyn Plan: --cont drain management.  left groin pain likely due to ovarian mass Complaining of worsening pain was started on PCA dilaudid on 11/9, however, pain does not appear to be from upper abdomen.  possibly due to left adnexal mass that caused torsion and cut off blood supply to ovary.  US pelvic with doppler, which showed  known left ovary mass with reduced blood flow. --Onc palliative care consulted, started on Fentanyl patch.  Weaned off PCA dilaudid --GynOnc consulted, did not rec surgery. 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  Most recent CMV DNA from 09/08/23 was neg.  Pt saw ID Dr. Rivka Safer on 09/08/23, and rec holding ganciclovir while not on chemo.  But if she plans to start chemo she will have to be on suppressive therapy with 900mg  VALGanciclovir every day.  Anemia: --s/p 2u pRBC so far --monitor Hgb and transfuse to keep Hgb >7  Hyponatremia 2/2 SIADH --persistent since Aug 2024, nadir-ed to 119 on 09/18/23.  Did improve with 3% saline, however, Na dropped back down to 122 after stopping 3% saline. --Cannot give tolvaptan given transaminitis.  --Na improving on current regimen Plan: --cont salt tab --lasix 20 mg daily --cont fluid restriction 1500 ml daily  PAD with ischemic left 2nd toe --painful and blackened toe --ABI showed Left ankle-brachial index of 0.60, consistent with moderate arterial occlusive disease. --angiogram today with intervention  Constipation, resolved  Hypokalemia --monitor and supplement PRN  Elevated temp --has been having tempt around 100 for the past couple of days, not quite a fever. --blood cx obtained.  Covid neg Plan: --avoid scheduled tylenol in order to monitor for fever  Tachycardia --likely due to demand and cancer --cont Lopressor   DVT prophylaxis: Lovenox SQ Code Status: Full code  Family Communication: husband updated at  bedside today Level of care: Telemetry Medical Dispo:   The patient is from: home Anticipated d/c is to: SNF rehab Anticipated d/c date is: 1-2  days   Subjective and Interval History:  Pt went for angiogram today.  Pt complained again of uncontrolled whole-body pain.   Objective: Vitals:   09/27/23 1345 09/27/23 1400 09/27/23 1421 09/27/23 1933  BP: 117/81 119/82 113/87 128/89  Pulse: (!) 108 (!) 107 (!) 109 (!) 124  Resp: (!) 29 (!) 34  20  Temp:   98.4 F (36.9 C) 98.9 F (37.2 C)  TempSrc:   Oral Oral  SpO2: 94% 93% 100% 96%  Weight:      Height:        Intake/Output Summary (Last 24 hours) at 09/27/2023 2021 Last data filed at 09/27/2023 1634 Gross per 24 hour  Intake 101.7 ml  Output 265 ml  Net -163.3 ml   Filed Weights   09/25/23 0254 09/26/23 0500 09/27/23 0500  Weight: 71.2 kg 72.3 kg 70.5 kg    Examination:   Constitutional: NAD, AAOx3 HEENT: conjunctivae and lids normal, EOMI CV: No cyanosis.   RESP: normal respiratory effort, on RA Neuro: II - XII grossly intact.   Psych: depressed mood and affect.     Data Reviewed: I have personally reviewed labs and imaging studies  Time spent: 35 minutes  Darlin Priestly, MD [facility] If 7PM-7AM, please contact night-coverage 09/27/2023, 8:21 PM

## 2023-09-27 NOTE — Op Note (Signed)
[facility]  Percutaneous Study/Intervention Procedural Note   Date of Surgery: 09/27/2023,1:47 PM  Surgeon:Phelan Schadt, Latina Craver   Pre-operative Diagnosis: Atherosclerotic occlusive disease bilateral lower extremities with rest pain and gangrenous changes of the left foot  Post-operative diagnosis:  Same  Procedure(s) Performed:  1.  Abdominal aortogram  2.  Left lower extremity distal runoff second-order catheter placement  3.  Percutaneous transluminal angioplasty and stent placement left common iliac artery  4.  StarClose right common femoral   Anesthesia: Conscious sedation was administered by the interventional radiology RN under my direct supervision. IV Versed plus fentanyl were utilized. Continuous ECG, pulse oximetry and blood pressure was monitored throughout the entire procedure. Conscious sedation was administered for a total of 40 minutes.  Sheath: 6 [place] Balkan right common femoral retrograde  Contrast: 65 cc   Fluoroscopy Time: 5.1 minutes  Indications: Patient presented to the hospital with multiple medical problems including metastatic cancer.  Is one of the related issues she was noted to have nonpalpable left pedal pulses with gangrenous changes to the toe and severe rest pain symptoms.  CT angiogram was obtained which demonstrates bulky plaque bilateral common iliac arteries with what appears to be a subtotal occlusion of the left common iliac artery.  Risks and benefits for angiography with hope for intervention for relief of rest pain was reviewed all questions answered patient agrees to proceed  Procedure:  Sheryl Brownis a 47 y.o. female who was identified and appropriate procedural time out was performed.  The patient was then placed supine on the table and prepped and draped in the usual sterile fashion.  Ultrasound was used to evaluate the right common femoral artery.  It was echolucent and pulsatile indicating it is patent .  An  ultrasound image was acquired for the permanent record.  A micropuncture needle was used to access the right common femoral artery under direct ultrasound guidance.  The microwire was then advanced under fluoroscopic guidance without difficulty followed by the micro-sheath.  A 0.035 J wire was advanced without resistance and a 5Fr sheath was placed.    Pigtail catheter was advanced to the level of T12 and AP projection of the aorta is obtained.  Pigtail catheter was repositioned to above the bifurcation and bilateral oblique views of the pelvis are obtained.  3000 units of heparin was given secondary to the patient's low platelet count.  Using the pigtail catheter and an advantage wire the aortic bifurcation was crossed and a Balkan sheath was advanced up and over and positioned with its tip in the proximal left common iliac artery.  After appropriate measurements a 7 x 60 life star stent was selected and then deployed across the subtotal occlusion in the common iliac.  It was postdilated with a 6 mm x 60 mm Lutonix drug-eluting balloon inflated to 10 atm for approximately 1 minute.  Follow-up imaging demonstrated less than 10% residual stenosis with rapid flow of contrast through the iliac system.  The dilator was reintroduced and under fluoroscopic visualization the sheath advanced down into the left common femoral artery.  Hand-injection of contrast was then used to create distal runoff.  After review of these images the sheath was pulled back into the right external iliac artery.  RAO projection of the right common femoral was then obtained and subsequently a StarClose device deployed without difficulty.  Findings:   Aortogram: The abdominal aorta is opacified with a bolus injection contrast.  There is mild calcific disease but there are  no hemodynamically significant stenoses.  The right common iliac artery demonstrates calcific changes but no hemodynamically significant lesions.  The left common iliac  artery demonstrates calcific changes proximally and then a subtotal occlusion in its midportion (greater than 95% stenosis).  Bilateral external iliac arteries are widely patent.  Bilateral internal iliac arteries are patent although the left is somewhat small compared to the right.  Right Lower Extremity: Visualized portions of the right common femoral proximal profunda femoris and proximal SFA are widely patent.  Left Lower Extremity: The left common femoral profunda femoris superficial femoral and popliteal arteries demonstrate mild calcific changes but there are no hemodynamically significant stenoses.  Trifurcation is heavily diseased with occlusion of the anterior tibial artery shortly after its origin.  It remains occluded throughout its entirety.  The tibioperoneal trunk is patent.  The peroneal is patent but very small and does not contribute to the foot significantly.  The posterior tibial is patent and free of hemodynamically significant stenosis.  It fills the plantar arteries and the pedal arch.  Over the great.  Following angioplasty and stent placement the left common and external iliac arteries are widely patent with less than 10% residual stenosis there is preservation of the distal runoff.    Disposition: Patient was taken to the recovery room in stable condition having tolerated the procedure well.  Sheryl Silva 09/27/2023,1:47 PM

## 2023-09-27 NOTE — Progress Notes (Signed)
Patient's husband at bedside overnight. At 3:30AM he came to desk upset about nurse not coming in the room since 7PM.. Then when I went into room at 5:20AM husband stating that I did not give her any of her regular medications, she did not get gabapentin, she went too long without her pain meds and did not get her xanax. I let him know I did come in with her night medications at 2052 and gabapentin was given during that round. I let them know that the oxycodone and xanax are PRN medications and we bring them when the patient calls out for them and lets [place] know. He states no one else has done it this way. I attempted to explain she could speak with her doctor about making these medications scheduled if she takes them on a schedule at home but he interrupted saying "she does not need to do that because everyone knows she has cancer and brings her meds around the clock". A call was made to desk for pain medication that was given at 2237.  Staff members were in the room at the following times: 1900, 2000, 2100, 2230, 0016, 0345 and 0520 as shown by documentation.

## 2023-09-27 NOTE — Progress Notes (Signed)
Patient received for Specials, dressing to right groin CDI. Patient flat. Oxygen at 1 LPM. Patient in no acute distress. VS Stable. Call light in reach. Family at bedside.  Cornell Barman Hiliana Eilts

## 2023-09-28 DIAGNOSIS — R1114 Bilious vomiting: Secondary | ICD-10-CM | POA: Diagnosis not present

## 2023-09-28 LAB — CBC
HCT: 24 % — ABNORMAL LOW (ref 36.0–46.0)
Hemoglobin: 7.8 g/dL — ABNORMAL LOW (ref 12.0–15.0)
MCH: 29.9 pg (ref 26.0–34.0)
MCHC: 32.5 g/dL (ref 30.0–36.0)
MCV: 92 fL (ref 80.0–100.0)
Platelets: 45 10*3/uL — ABNORMAL LOW (ref 150–400)
RBC: 2.61 MIL/uL — ABNORMAL LOW (ref 3.87–5.11)
RDW: 18.1 % — ABNORMAL HIGH (ref 11.5–15.5)
WBC: 9.7 10*3/uL (ref 4.0–10.5)
nRBC: 6 % — ABNORMAL HIGH (ref 0.0–0.2)

## 2023-09-28 LAB — BASIC METABOLIC PANEL
Anion gap: 8 (ref 5–15)
BUN: 13 mg/dL (ref 6–20)
CO2: 24 mmol/L (ref 22–32)
Calcium: 8 mg/dL — ABNORMAL LOW (ref 8.9–10.3)
Chloride: 99 mmol/L (ref 98–111)
Creatinine, Ser: 0.43 mg/dL — ABNORMAL LOW (ref 0.44–1.00)
GFR, Estimated: >60 mL/min (ref >60)
Glucose, Bld: 104 mg/dL — ABNORMAL HIGH (ref 70–99)
Potassium: 3.9 mmol/L (ref 3.5–5.1)
Sodium: 131 mmol/L — ABNORMAL LOW (ref 135–145)

## 2023-09-28 LAB — MAGNESIUM: Magnesium: 2.1 mg/dL (ref 1.7–2.4)

## 2023-09-28 MED ORDER — CLOPIDOGREL BISULFATE 75 MG PO TABS
75.0000 mg | ORAL_TABLET | Freq: Every day | ORAL | Status: DC
  Administered 2023-09-28 – 2023-10-04 (×7): 75 mg via ORAL
  Filled 2023-09-28 (×7): qty 1

## 2023-09-28 MED ORDER — OXYCODONE HCL ER 15 MG PO T12A
60.0000 mg | EXTENDED_RELEASE_TABLET | Freq: Two times a day (BID) | ORAL | Status: DC
  Administered 2023-09-29 – 2023-10-04 (×12): 60 mg via ORAL
  Filled 2023-09-28 (×13): qty 4

## 2023-09-28 MED ORDER — ASPIRIN 81 MG PO TBEC
81.0000 mg | DELAYED_RELEASE_TABLET | Freq: Every day | ORAL | Status: DC
  Administered 2023-09-28 – 2023-10-04 (×7): 81 mg via ORAL
  Filled 2023-09-28 (×7): qty 1

## 2023-09-28 NOTE — Progress Notes (Signed)
Nutrition Follow-up  DOCUMENTATION CODES:   Not applicable  INTERVENTION:   Ensure Enlive po TID, each supplement provides 350 kcal and 20 grams of protein.  Magic cup TID with meals, each supplement provides 290 kcal and 9 grams of protein  Liberalize diet   MVI po daily   Pt remains at high refeed risk; recommend monitor potassium, magnesium and phosphorus labs daily until stable  Daily weights   NUTRITION DIAGNOSIS:   Increased nutrient needs related to cancer and cancer related treatments as evidenced by estimated needs. -ongoing   GOAL:   Patient will meet greater than or equal to 90% of their needs -not met   MONITOR:   PO intake, Supplement acceptance, Labs, Weight trends, I & O's, Skin  ASSESSMENT:   47 y.o. 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  -Pt s/p abdominal aortogram with left lower extremity angiogram with angioplasty and stent placement of the left common iliac artery 11/26  Pt with ongoing poor appetite and oral intake but oral intake does seem improved since last week. Pt is drinking most of her Ensure supplements. Recommend continue supplements. RD will liberalize pt's diet as pt is not eating enough to exceed any nutrient limits. Pt remains at refeed risk. Per chart, pt is weight stable since admission.   Medications reviewed and include: aspirin, plavix, lovenox, lasix, MVI, protonix, senokot, NaCl tabs  Labs reviewed: Na 131(L), K 3.9 wnl, Mg 2.1 wnl Hgb 7.8(L), Hct 24.0(L)  Drains-   Diet Order:   Diet Order             Diet Heart Room service appropriate? Yes; Fluid consistency: Thin; Fluid restriction: 1500 mL Fluid  Diet effective now                  EDUCATION NEEDS:   Education  needs have been addressed  Skin:  Skin Assessment: Reviewed RN Assessment  Last BM:  11/25  Height:   Ht Readings from Last 1 Encounters:  09/19/23 5\' 6"  (1.676 m)    Weight:   Wt Readings from Last 1 Encounters:  09/27/23 70.5 kg    Ideal Body Weight:  59.1 kg  BMI:  Body mass index is 25.09 kg/m.  Estimated Nutritional Needs:   Kcal:  1800-2100kcal/day  Protein:  90-105 grams  Fluid:  1.8-2.1L/day  Betsey Holiday MS, RD, LDN Please refer to [facility] for RD and/or RD on-call/weekend/after hours pager

## 2023-09-28 NOTE — TOC Progression Note (Signed)
Transition of Care [facility]) - Progression Note    Patient Details  Name: Sheryl Silva MRN: [id number] Date of Birth: 31-Jul-1976  Transition of [facility]) CM/SW Contact  Chapman Fitch, RN Phone Number: 09/28/2023, 9:10 AM  Clinical Narrative:          Message sent to MD to determine when it is anticipated that patient will me medically stable for discharge  Once patient is deemed appropriate for discharge   TOC to call [phone] to completed expedited appeal for patient to discharge to [facility]    Previous MD spoke with Candise Bowens MD and was told that once appeal process was completed denial would be overturned   Expected Discharge Plan and Services                                               Social Determinants of Health (SDOH) Interventions SDOH Screenings   Food Insecurity: No Food Insecurity (09/09/2023)  Recent Concern: Food Insecurity - Food Insecurity Present (07/22/2023)  Housing: Low Risk  (09/09/2023)  Transportation Needs: No Transportation Needs (09/09/2023)  Recent Concern: Transportation Needs - Unmet Transportation Needs (07/22/2023)  Utilities: Not At Risk (09/09/2023)  Alcohol Screen: Low Risk  (02/15/2023)  Depression (PHQ2-9): Low Risk  (02/14/2023)  Financial Resource Strain: Low Risk  (05/09/2023)   Received from [facility], [facility]  Recent Concern: Financial Resource Strain - Medium Risk (02/15/2023)  Physical Activity: Inactive (02/15/2023)  Social Connections: Socially Isolated (02/15/2023)  Stress: Stress Concern Present (02/15/2023)  Tobacco Use: Medium Risk (09/08/2023)    Readmission Risk Interventions    09/10/2023    1:52 PM 08/22/2023   10:03 PM 08/04/2023    8:47 AM  Readmission Risk Prevention Plan  Transportation Screening Complete Complete   PCP or Specialist Appt within 3-5 Days Complete Complete Complete  HRI or Home Care Consult Complete Complete   Social Work Consult for  Recovery Care Planning/Counseling Complete --   Palliative Care Screening Not Applicable Not Applicable   Medication Review Oceanographer) Complete Complete

## 2023-09-28 NOTE — TOC Progression Note (Signed)
Transition of Care [facility]) - Progression Note    Patient Details  Name: Sheryl Silva MRN: [id number] Date of Birth: 12/12/1975  Transition [facility] Care [facility]) CM/SW Contact  Chapman Fitch, RN Phone Number: 09/28/2023, 2:33 PM  Clinical Narrative:     Met with patient at bedside.  She confirms she still wishes to go to SNF for STR  Received call from [employer] Last therapy note 11/19  MD notified that new PT OT eval would have to be ordered.  If SNF is still the appropriate level of care, after there are new therapy notes, TOC to call fast track appeal line at [phone], and leave a message requesting fast track appeal        Expected Discharge Plan and Services                                               Social Determinants of Health (SDOH) Interventions SDOH Screenings   Food Insecurity: No Food Insecurity (09/09/2023)  Recent Concern: Food Insecurity - Food Insecurity Present (07/22/2023)  Housing: Low Risk  (09/09/2023)  Transportation Needs: No Transportation Needs (09/09/2023)  Recent Concern: Transportation Needs - Unmet Transportation Needs (07/22/2023)  Utilities: Not At Risk (09/09/2023)  Alcohol Screen: Low Risk  (02/15/2023)  Depression (PHQ2-9): Low Risk  (02/14/2023)  Financial Resource Strain: Low Risk  (05/09/2023)   Received from [facility], [facility]  Recent Concern: Financial Resource Strain - Medium Risk (02/15/2023)  Physical Activity: Inactive (02/15/2023)  Social Connections: Socially Isolated (02/15/2023)  Stress: Stress Concern Present (02/15/2023)  Tobacco Use: Medium Risk (09/08/2023)    Readmission Risk Interventions    09/10/2023    1:52 PM 08/22/2023   10:03 PM 08/04/2023    8:47 AM  Readmission Risk Prevention Plan  Transportation Screening Complete Complete   PCP or Specialist Appt within 3-5 Days Complete Complete Complete  HRI or Home Care Consult Complete Complete   Social Work  Consult for Recovery Care Planning/Counseling Complete --   Palliative Care Screening Not Applicable Not Applicable   Medication Review Oceanographer) Complete Complete

## 2023-09-28 NOTE — Progress Notes (Signed)
PROGRESS NOTE    Sheryl Silva  ZOX:[id number] DOB: 1976-07-24 DOA: 09/08/2023 PCP: [name], MD  201A/201A-AA  LOS: 19 days   Brief hospital course: Sheryl Silva is a 47 y.o. 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  Also with recent hospitalization for CMV colitis and has been managed on valganciclovir by ID presented to the emergency room with abnormal labs, generalized abdominal pain, nausea, poor appetite.   S/p chole drain placement.  Worsening abdominal pain difficult to control, she was requesting IV pain meds (although she hadn't maximized her home oxycodone), later started on Dilaudid PCA which has since weaned off.  Palliative care was consulted.  Pt had to be transferred to stepdown for 3% for hyponatremia of 119. Nephro was consulted and pt is currently on NaCl tabs and fluid restriction as per nephro.   Assessment & Plan:   Acute cholecystitis  S/p chole tube Concern of acute cholecystitis with history of stage IV signet cell adenocarcinoma, general surgery was consulted but she is not a good candidate for any surgical intervention. S/p cholecystostomy drain placement by IR on 11/8, draining bile, apparently cultures were not sent so they were ordered next day.  Improving transaminitis. --completed 14 days of zosyn Plan: --cont drain management. - gen surg f/u 6 weeks, ir f/u prn for drain issues  left groin pain likely due to ovarian mass Complaining of worsening pain was started on PCA dilaudid on 11/9, however, pain does not appear to be from upper abdomen.  possibly due to left adnexal mass that caused torsion and cut off blood  supply to ovary.  US pelvic with doppler, which showed known left ovary mass with reduced blood flow. --Onc palliative care consulted, started on Fentanyl patch.  Weaned off PCA dilaudid --GynOnc consulted, did not rec surgery. Plan: --pain management per onc palliative care provider. Patient reports ongoing pain control problems so after discussion w/ josh borders today will d/c fentanyl patch and substitute oxycontin 60 mg bid   Cancer pain Stage IV metastatic signet ring adenocarcinoma: --Pt reported pain still uncontrolled despite being on a large amount of opioids --pain management per onc palliative care provider, see above   H/O CMV colitis: Pt was on valganciclovir.  Most recent CMV DNA from 09/08/23 was neg.  Pt saw ID Dr. Rivka Safer on 09/08/23, and rec holding ganciclovir while not on chemo.  But if she plans to start chemo she will have to be on suppressive therapy with 900mg  VALGanciclovir every day.  Anemia: --s/p 2u pRBC so far --monitor Hgb and transfuse to keep Hgb >7  Hyponatremia 2/2 SIADH --persistent since Aug 2024, nadir-ed to 119 on 09/18/23.  Did improve with 3% saline, however, Na dropped back down to 122 after stopping 3% saline. --Cannot give tolvaptan given transaminitis.  --Na improving on current regimen Plan: --cont salt tab --lasix 20 mg daily --cont fluid restriction 1500 ml daily  PAD with ischemic left 2nd toe --painful and blackened toe --ABI showed Left ankle-brachial index of 0.60, consistent with moderate arterial occlusive disease. --angiogram 11/26 with intervention - continue aspirin/plavix, f/u vascular 3 mo  Constipation, resolved  Hypokalemia --monitor and supplement PRN  Elevated temp --has been having tempt around 100 for the past couple of days, not quite  a fever. Today wnl --blood cx obtained.  Covid neg Plan: --avoid scheduled tylenol in order to monitor for fever  Tachycardia --likely due to demand and cancer --cont  Lopressor  Disposition Plan today was for care mgmt to submit expedited appeal so that she can hopefully go to snf. TOC messaged me later in the day saying absence of pt/ot notes for the past week means we can't appeal today. Thus I will order pt/ot eval, once those notes are in we can then submit expedited appeal.   DVT prophylaxis: Lovenox SQ Code Status: Full code  Family Communication: none at bedside Level of care: Telemetry Medical Dispo:   The patient is from: home Anticipated d/c is to: SNF rehab Anticipated d/c date is: pending results of appeal   Subjective and Interval History:  Pain stable, tolerating some diet   Objective: Vitals:   09/27/23 1933 09/27/23 2343 09/28/23 0319 09/28/23 0806  BP: 128/89 (!) 136/90 (!) 136/99 (!) 139/94  Pulse: (!) 124 (!) 113 (!) 123 (!) 124  Resp: 20 (!) 22 20 18   Temp: 98.9 F (37.2 C) 99.1 F (37.3 C) 98 F (36.7 C) 98 F (36.7 C)  TempSrc: Oral Oral Oral Oral  SpO2: 96% 98% 100% 99%  Weight:      Height:        Intake/Output Summary (Last 24 hours) at 09/28/2023 1456 Last data filed at 09/28/2023 1610 Gross per 24 hour  Intake 153.7 ml  Output 125 ml  Net 28.7 ml   Filed Weights   09/25/23 0254 09/26/23 0500 09/27/23 0500  Weight: 71.2 kg 72.3 kg 70.5 kg    Examination:   Constitutional: NAD, AAOx3 HEENT: conjunctivae and lids normal, EOMI CV: No cyanosis.   RESP: normal respiratory effort, on RA Neuro: II - XII grossly intact.   Psych: depressed mood and affect.     Data Reviewed: I have personally reviewed labs and imaging studies  Time spent: 35 minutes  Silvano Bilis, MD [facility] If 7PM-7AM, please contact night-coverage 09/28/2023, 2:56 PM

## 2023-09-28 NOTE — TOC Progression Note (Signed)
Transition of Care [facility]) - Progression Note    Patient Details  Name: Sheryl Silva MRN: [id number] Date of Birth: 1976-03-31  Transition of Care [facility]) CM/SW Contact  Chapman Fitch, RN Phone Number: 09/28/2023, 2:10 PM  Clinical Narrative:      Per MD patient is medically ready for expedited appeal to be started  Call placed to [employer] to initiate appeal.  Was transferred to 4 different departments, and was finally transferred to [phone].  I was instructed to leave a detailed message with request.  Required information was left on message, and it states to allow 24 hours for response.    MD updated Patient updated  Notified [name] with [facility] [phone]      Expected Discharge Plan and Services                                               Social Determinants of Health (SDOH) Interventions SDOH Screenings   Food Insecurity: No Food Insecurity (09/09/2023)  Recent Concern: Food Insecurity - Food Insecurity Present (07/22/2023)  Housing: Low Risk  (09/09/2023)  Transportation Needs: No Transportation Needs (09/09/2023)  Recent Concern: Transportation Needs - Unmet Transportation Needs (07/22/2023)  Utilities: Not At Risk (09/09/2023)  Alcohol Screen: Low Risk  (02/15/2023)  Depression (PHQ2-9): Low Risk  (02/14/2023)  Financial Resource Strain: Low Risk  (05/09/2023)   Received from [facility], [facility]  Recent Concern: Financial Resource Strain - Medium Risk (02/15/2023)  Physical Activity: Inactive (02/15/2023)  Social Connections: Socially Isolated (02/15/2023)  Stress: Stress Concern Present (02/15/2023)  Tobacco Use: Medium Risk (09/08/2023)    Readmission Risk Interventions    09/10/2023    1:52 PM 08/22/2023   10:03 PM 08/04/2023    8:47 AM  Readmission Risk Prevention Plan  Transportation Screening Complete Complete   PCP or Specialist Appt within 3-5 Days Complete Complete Complete  HRI or Home  Care Consult Complete Complete   Social Work Consult for Recovery Care Planning/Counseling Complete --   Palliative Care Screening Not Applicable Not Applicable   Medication Review Oceanographer) Complete Complete

## 2023-09-28 NOTE — Progress Notes (Signed)
  Progress Note    09/28/2023 10:40 AM 1 Day Post-Op  Subjective:  Sheryl Silva is a 47 yo female now POD #1 from abdominal aortogram with left lower extremity angiogram with angioplasty and stent placement of the left common iliac artery.  Patient is recovering as expected this morning.  Patient is sitting in a chair relaxing.  Patient endorses that her left lower extremity feels better.  She endorses she still has some of the sharp shooting pains to her toe.  We discussed in detail that this may take a month or 2 in order to resolve due to her neuropathy.  Patient verbalized her understanding.  No other complaints overnight vitals all remained stable.   Vitals:   09/28/23 0319 09/28/23 0806  BP: (!) 136/99 (!) 139/94  Pulse: (!) 123 (!) 124  Resp: 20 18  Temp: 98 F (36.7 C) 98 F (36.7 C)  SpO2: 100% 99%   Physical Exam: Cardiac:  RRR, Normal S1, S2. No murmurs Lungs:  Clear on auscultation, Non labored, no rales rhonchi or wheezing.  Incisions:  Right groin incision site with dressing. Clean dry and intact. No hematoma or seroma to note.  Extremities:  Bilateral lower extremities warm to touch. Tender left 2nd toe. Palpable equal Dorsalis Pedis pulses.  Abdomen:  Positive high-pitched bowel sounds throughout, soft, positive tenderness on palpation and slight distention., no masses  Neurologic: A&O X 3; No focal weakness or paresthesias are detected; speech is fluent/normal   CBC    Component Value Date/Time   WBC 9.7 09/28/2023 0310   RBC 2.61 (L) 09/28/2023 0310   HGB 7.8 (L) 09/28/2023 0310   HGB 8.1 (L) 09/08/2023 1332   HCT 24.0 (L) 09/28/2023 0310   PLT 45 (L) 09/28/2023 0310   PLT 166 09/08/2023 1332   MCV 92.0 09/28/2023 0310   MCH 29.9 09/28/2023 0310   MCHC 32.5 09/28/2023 0310   RDW 18.1 (H) 09/28/2023 0310   LYMPHSABS 0.7 09/08/2023 1332   MONOABS 0.3 09/08/2023 1332   EOSABS 0.0 09/08/2023 1332   BASOSABS 0.0 09/08/2023 1332    BMET    Component  Value Date/Time   NA 131 (L) 09/28/2023 0310   K 3.9 09/28/2023 0310   CL 99 09/28/2023 0310   CO2 24 09/28/2023 0310   GLUCOSE 104 (H) 09/28/2023 0310   BUN 13 09/28/2023 0310   CREATININE 0.43 (L) 09/28/2023 0310   CREATININE 0.50 09/08/2023 1332   CALCIUM 8.0 (L) 09/28/2023 0310   GFRNONAA >60 09/28/2023 0310   GFRNONAA >60 09/08/2023 1332   GFRAA >60 09/27/2018 2052    INR    Component Value Date/Time   INR 1.2 09/08/2023 1948     Intake/Output Summary (Last 24 hours) at 09/28/2023 1040 Last data filed at 09/28/2023 0865 Gross per 24 hour  Intake 153.7 ml  Output 125 ml  Net 28.7 ml     Assessment/Plan:  47 y.o. female is s/p abdominal aortogram with left lower extremity angiogram with angioplasty and stent placement of the left common iliac artery. 1 Day Post-Op   PLAN: Started on ASA 81 mg daily and Plavix 75 mg daily.  Okay per Vascular Surgery for patient to discharge when medically stable and ready.  Follow up with [facility] in 3 months.   DVT prophylaxis:  Lovenox 40 mg Sq Q 24 hrs   Sheryl Silva Sheryl [facility] 09/28/2023 10:40 AM

## 2023-09-28 NOTE — Progress Notes (Signed)
Patient refusing vital signs at this time.

## 2023-09-28 NOTE — Evaluation (Signed)
Occupational Therapy Re-Evaluation Patient Details Name: Sheryl Silva MRN: [id number] DOB: May 10, 1976 Today's Date: 09/28/2023   History of Present Illness Pt is a 47 y.o. female admitted with acute cholecystitis (s/p cole tube drain placed 11/8), L groin pain due to ovarian mass, s/p LLE angiogram 11/26.   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 Also with recent hospitalization for CMV colitis.   Clinical Impression   PTA, pt independent and occasionally uses SPC or RW for mobility. Pt received attempting to get OOB with bed alarm going off. OT assists with stand pivot transfer to [facility] with HHA and CGA, pt performing anterior pericare using lateral leans, and stand pivot back to bed with CGA + assist for line mgmt. +2 required to scoot up in bed with pads due to pt fatigued. Pt reporting 8/10 pain "everywhere". Decreased activity tolerance, pain and functional strength/balance deficits impact safe, efficient ADL performance. Pt SOB after exertion on 2L O2, difficult to obtain O2 levels due to poor pleth reading (RN in room and aware). RN administering pain meds. Pt educated on waiting for staff to assist with toileting needs, verbalizes understanding. Discharge recommendation and goals remain appropriate. Patient will benefit from continued inpatient follow up therapy, <3 hours/day.       If plan is discharge home, recommend the following: A little help with walking and/or transfers;A little help with bathing/dressing/bathroom;Help with stairs or ramp for entrance;Assistance with cooking/housework    Functional Status Assessment  Patient has had a recent decline in their functional status and demonstrates the ability to make  significant improvements in function in a reasonable and predictable amount of time.  Equipment Recommendations  BSC/3in1    Recommendations for Other Services PT consult     Precautions / Restrictions Precautions Precautions: Fall Precaution Comments: R biliary drain; R chest port, s/p LLE angiogram (okay to mobilize per Dr. Ashok Pall 11/27) Restrictions Weight Bearing Restrictions: No      Mobility Bed Mobility Overal bed mobility: Needs Assistance Bed Mobility: Rolling Rolling: Contact guard assist Sidelying to sit: Min assist   Sit to supine: Contact guard assist Sit to sidelying: Contact guard assist      Transfers Overall transfer level: Needs assistance Equipment used: 1 person hand held assist Transfers: Bed to chair/wheelchair/BSC Sit to Stand: Contact guard assist     Step pivot transfers: Contact guard assist     General transfer comment: Transfers with assist for lines/HHA to step pivot bed <>bsc      Balance Overall balance assessment: Needs assistance Sitting-balance support: No upper extremity supported, Feet supported Sitting balance-Leahy Scale: Good     Standing balance support: During functional activity, Bilateral upper extremity supported Standing balance-Leahy Scale: Fair Standing balance comment: stands only to transfer                           ADL either performed or assessed with clinical judgement   ADL Overall ADL's : Needs assistance/impaired Eating/Feeding: Set up;Sitting   Grooming: Set up;Sitting   Upper Body Bathing: Minimal assistance;Sitting   Lower Body Bathing: Maximal assistance;Sitting/lateral leans;Sit to/from stand   Upper Body Dressing : Minimal assistance;Sitting   Lower Body Dressing: Maximal assistance;Sit to/from stand;Sitting/lateral leans   Toilet Transfer: Contact guard Engineer, manufacturing systems Details (indicate cue type  and reason): pt found attempting to get OOB with bed alarm going off,  requests to use [facility] Toileting- Clothing Manipulation and Hygiene: Sitting/lateral lean;Contact guard assist       Functional mobility during ADLs: Cueing for sequencing;Cueing for safety;Contact guard assist       Vision Baseline Vision/History: 0 No visual deficits Ability to See in Adequate Light: 0 Adequate Patient Visual Report: No change from baseline              Pertinent Vitals/Pain Pain Assessment Pain Assessment: 0-10 Pain Score: 8  Pain Location: everywhere Pain Descriptors / Indicators: Grimacing, Dull, Sore Pain Intervention(s): Monitored during session, RN gave pain meds during session, Limited activity within patient's tolerance     Extremity/Trunk Assessment Upper Extremity Assessment Upper Extremity Assessment: Generalized weakness   Lower Extremity Assessment Lower Extremity Assessment: Generalized weakness       Communication Communication Communication: No apparent difficulties   Cognition Arousal: Alert Behavior During Therapy: WFL for tasks assessed/performed Overall Cognitive Status: Within Functional Limits for tasks assessed                                       General Comments  RN present for medications, lines/leads intact start of session. Informed RN of poor pleth reading for SpO2%            Home Living Family/patient expects to be discharged to:: Private residence Living Arrangements: Alone Available Help at Discharge: Available 24 hours/day;Friend(s);Other (Comment) (significant other) Type of Home: Apartment Home Access: Stairs to enter Entrance Stairs-Number of Steps: flight Entrance Stairs-Rails: Right;Left Home Layout: One level     Bathroom Shower/Tub: Engineer, production Accessibility: Yes   Home Equipment: Agricultural consultant (2 wheels);Cane - single point          Prior Functioning/Environment Prior Level of Function : Independent/Modified Independent             Mobility  Comments: Uses SPC at baseline/daily, uses RW when she feels the need to. Pt denies hx of falls, does not drive ADLs Comments: mod independent to independent (depends on day)        OT Problem List: Decreased strength;Decreased activity tolerance;Impaired balance (sitting and/or standing);Pain      OT Treatment/Interventions: Self-care/ADL training;Therapeutic exercise;Energy conservation;DME and/or AE instruction;Therapeutic activities    OT Goals(Current goals can be found in the care plan section) Acute Rehab OT Goals Patient Stated Goal: to go to rehab OT Goal Formulation: With patient Time For Goal Achievement: 10/12/23 Potential to Achieve Goals: Good  OT Frequency: Min 1X/week       AM-PAC OT "6 Clicks" Daily Activity     Outcome Measure Help from another person eating meals?: None Help from another person taking care of personal grooming?: A Little Help from another person toileting, which includes using toliet, bedpan, or urinal?: A Little Help from another person bathing (including washing, rinsing, drying)?: A Lot Help from another person to put on and taking off regular upper body clothing?: A Little Help from another person to put on and taking off regular lower body clothing?: A Lot 6 Click Score: 17   End of Session Nurse Communication: Mobility status  Activity Tolerance: Patient limited by fatigue Patient left: in bed;with call bell/phone within reach;with bed alarm set  OT Visit Diagnosis: Other abnormalities of gait and mobility (R26.89);Muscle weakness (generalized) (M62.81)  Time: 5784-6962 OT Time Calculation (min): 23 min Charges:  OT General Charges $OT Visit: 1 Visit OT Evaluation $OT Re-eval: 1 Re-eval OT Treatments $Self Care/Home Management : 8-22 mins  Hawk Mones L. Ellias Mcelreath, OTR/L  09/28/23, 5:06 PM

## 2023-09-29 DIAGNOSIS — G893 Neoplasm related pain (acute) (chronic): Secondary | ICD-10-CM | POA: Diagnosis not present

## 2023-09-29 LAB — CULTURE, BLOOD (ROUTINE X 2)
Culture: NO GROWTH
Culture: NO GROWTH
Special Requests: ADEQUATE
Special Requests: ADEQUATE

## 2023-09-29 NOTE — Progress Notes (Signed)
PROGRESS NOTE    Sheryl Silva  RUE:[id number] DOB: 1976-09-09 DOA: 09/08/2023 PCP: [name], MD  [id number]-AA  LOS: 20 days   Brief hospital course: Sheryl Silva is a 47 y.o. 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  Also with recent hospitalization for CMV colitis and has been managed on valganciclovir by ID presented to the emergency room with abnormal labs, generalized abdominal pain, nausea, poor appetite.   S/p chole drain placement.  Worsening abdominal pain difficult to control, she was requesting IV pain meds (although she hadn't maximized her home oxycodone), later started on Dilaudid PCA which has since weaned off.  Palliative care was consulted.  Pt had to be transferred to stepdown for 3% for hyponatremia of 119. Nephro was consulted and pt is currently on NaCl tabs and fluid restriction as per nephro.   Assessment & Plan:   Acute cholecystitis  S/p chole tube Concern of acute cholecystitis with history of stage IV signet cell adenocarcinoma, general surgery was consulted but she is not a good candidate for any surgical intervention. S/p cholecystostomy drain placement by IR on 11/8, draining bile, apparently cultures were not sent so they were ordered next day.  Improving transaminitis. --completed 14 days of zosyn Plan: --cont drain management - gen surg f/u 6 weeks, ir f/u prn for drain issues   left groin pain likely due to ovarian mass Complaining of worsening pain was started on PCA dilaudid on 11/9, however, pain does not appear to be from upper abdomen.  possibly due to left adnexal mass that caused torsion and cut off blood  supply to ovary.  US pelvic with doppler, which showed known left ovary mass with reduced blood flow. --Onc palliative care consulted, started on Fentanyl patch.  Weaned off PCA dilaudid --GynOnc consulted, did not rec surgery. Plan: --pain management per onc palliative care provider   Cancer pain Stage IV metastatic signet ring adenocarcinoma: --Pt reported pain still uncontrolled despite being on a large amount of opioids --switched from Fentanyl patch to oxycontin 60 mg BID --pain management per onc palliative care provider   H/O CMV colitis: Pt was on valganciclovir.  Most recent CMV DNA from 09/08/23 was neg.  Pt saw ID Dr. Rivka Safer on 09/08/23, and rec holding ganciclovir while not on chemo.  But if she plans to start chemo she will have to be on suppressive therapy with 900mg  VALGanciclovir every day.  Anemia: --s/p 2u pRBC so far --monitor Hgb and transfuse to keep Hgb >7  Hyponatremia 2/2 SIADH --persistent since Aug 2024, nadir-ed to 119 on 09/18/23.  Did improve with 3% saline, however, Na dropped back down to 122 after stopping 3% saline. --Cannot give tolvaptan given transaminitis.  --Na improving on current regimen Plan: --cont salt tab --lasix 20 mg daily --cont fluid restriction 1500 ml daily  PAD with ischemic left 2nd toe --painful and blackened toe --ABI showed Left ankle-brachial index of 0.60, consistent with moderate arterial occlusive disease. --angiogram on 11/26 with intervention --cont ASA and plavix --f/u with vascular in 3 months  Constipation, resolved  Hypokalemia --monitor and supplement PRN  Elevated temp --has been having tempt around 100 for the past couple of days, not quite a fever. --blood cx obtained.  Covid neg Plan: --avoid scheduled  tylenol in order to monitor for fever  Tachycardia --likely due to demand and cancer --cont Lopressor   DVT prophylaxis: Lovenox SQ Code Status: Full code  Family Communication:  Level of  care: Telemetry Medical Dispo:   The patient is from: home Anticipated d/c is to: SNF rehab Anticipated d/c date is: whenever bed available   Subjective and Interval History:  Pt reported pain better controlled on oxycontin.  Pt now wants to go to a SNF in [place] to be with her family.   Objective: Vitals:   09/28/23 2326 09/29/23 0402 09/29/23 0756 09/29/23 1513  BP:  (!) 149/101 (!) 127/94 (!) 125/90  Pulse: (!) 104 (!) 122 (!) 126 (!) 105  Resp:  16 17 16   Temp:  99 F (37.2 C) 99.1 F (37.3 C) 98.5 F (36.9 C)  TempSrc:  Oral    SpO2: 96% 94% 95% 100%  Weight:      Height:        Intake/Output Summary (Last 24 hours) at 09/29/2023 1843 Last data filed at 09/29/2023 0123 Gross per 24 hour  Intake --  Output 100 ml  Net -100 ml   Filed Weights   09/25/23 0254 09/26/23 0500 09/27/23 0500  Weight: 71.2 kg 72.3 kg 70.5 kg    Examination:   Constitutional: NAD, AAOx3 HEENT: conjunctivae and lids normal, EOMI CV: No cyanosis.   RESP: normal respiratory effort Neuro: II - XII grossly intact.   Psych: depressed mood and affect.     Data Reviewed: I have personally reviewed labs and imaging studies  Time spent: 35 minutes  Darlin Priestly, MD [facility] If 7PM-7AM, please contact night-coverage 09/29/2023, 6:43 PM

## 2023-09-29 NOTE — Progress Notes (Signed)
PT Cancellation Note  Patient Details Name: Sheryl Silva MRN: [id number] DOB: 21-Jun-1976   Cancelled Treatment:    Reason Eval/Treat Not Completed: Other (comment).  PT consult received.  Chart reviewed.  Pt resting in bed upon PT arrival and declining therapy session (pt reporting recently walking with  nursing with RW to bathroom and declining anymore activity at this time; pt reports she is planning on discharging to her daughter's in [place] once discharged from hospital).  Will re-attempt PT session at a later date/time.  Hendricks Limes, PT 09/29/23, 1:13 PM

## 2023-09-30 DIAGNOSIS — G893 Neoplasm related pain (acute) (chronic): Secondary | ICD-10-CM | POA: Diagnosis not present

## 2023-09-30 LAB — BASIC METABOLIC PANEL
Anion gap: 10 (ref 5–15)
BUN: 12 mg/dL (ref 6–20)
CO2: 24 mmol/L (ref 22–32)
Calcium: 8.2 mg/dL — ABNORMAL LOW (ref 8.9–10.3)
Chloride: 98 mmol/L (ref 98–111)
Creatinine, Ser: 0.55 mg/dL (ref 0.44–1.00)
GFR, Estimated: >60 mL/min (ref >60)
Glucose, Bld: 100 mg/dL — ABNORMAL HIGH (ref 70–99)
Potassium: 4.2 mmol/L (ref 3.5–5.1)
Sodium: 132 mmol/L — ABNORMAL LOW (ref 135–145)

## 2023-09-30 LAB — CBC
HCT: 22.6 % — ABNORMAL LOW (ref 36.0–46.0)
Hemoglobin: 7.2 g/dL — ABNORMAL LOW (ref 12.0–15.0)
MCH: 29.1 pg (ref 26.0–34.0)
MCHC: 31.9 g/dL (ref 30.0–36.0)
MCV: 91.5 fL (ref 80.0–100.0)
Platelets: 39 10*3/uL — ABNORMAL LOW (ref 150–400)
RBC: 2.47 MIL/uL — ABNORMAL LOW (ref 3.87–5.11)
RDW: 18.2 % — ABNORMAL HIGH (ref 11.5–15.5)
WBC: 10.2 10*3/uL (ref 4.0–10.5)
nRBC: 4.8 % — ABNORMAL HIGH (ref 0.0–0.2)

## 2023-09-30 LAB — MAGNESIUM: Magnesium: 2 mg/dL (ref 1.7–2.4)

## 2023-09-30 LAB — PHOSPHORUS: Phosphorus: 3.4 mg/dL (ref 2.5–4.6)

## 2023-09-30 MED ORDER — METOPROLOL TARTRATE 50 MG PO TABS
50.0000 mg | ORAL_TABLET | Freq: Two times a day (BID) | ORAL | Status: DC
  Administered 2023-09-30 – 2023-10-04 (×8): 50 mg via ORAL
  Filled 2023-09-30 (×8): qty 1

## 2023-09-30 NOTE — Plan of Care (Signed)

## 2023-09-30 NOTE — Plan of Care (Addendum)
Fentanyl patched removed and wasted ,same verified by Bill RN Problem: Education: Goal: Knowledge of General Education information will improve Description: Including pain rating scale, medication(s)/side effects and non-pharmacologic comfort measures Outcome: Progressing   Problem: Clinical Measurements: Goal: Ability to maintain clinical measurements within normal limits will improve Outcome: Progressing Goal: Will remain free from infection Outcome: Progressing Goal: Diagnostic test results will improve Outcome: Progressing Goal: Respiratory complications will improve Outcome: Progressing Goal: Cardiovascular complication will be avoided Outcome: Progressing   Problem: Activity: Goal: Risk for activity intolerance will decrease Outcome: Progressing

## 2023-09-30 NOTE — TOC Progression Note (Addendum)
Transition of Care [facility]) - Progression Note    Patient Details  Name: Sheryl Silva MRN: [id number] Date of Birth: [birth year]  Transition of Care [facility]) CM/SW Contact  Margarito Liner, LCSW Phone Number: 09/30/2023, 1:40 PM  Clinical Narrative:   CSW left voicemail for [employer] requesting fast track appeal. Patient is considering going to a SNF in [place] instead because none of her family is here in [place]. CSW explained that if insurance authorization is approved, she may have to go to [facility] due to extensive referral process to get her to a SNF in [place]. CSW gave her CMS scores for facilities within 25 miles of [place], [place]. CSW encouraged her to pick top 3 preference and if they cannot accept her, will move forward with [facility] if approved. Patient stated her daughter would transport her to the SNF. Patient confirmed her current oxygen is acute. CSW also explained that we may not be able to order home oxygen if she is going to SNF.   3:54 pm: Received voicemail from [place] with the [employer]. They are processing appeal under expedited timeframe. CSW faxed clinicals for review and left Sue Lush a voicemail to notify.  Expected Discharge Plan and Services                                               Social Determinants of Health (SDOH) Interventions SDOH Screenings   Food Insecurity: No Food Insecurity (09/09/2023)  Recent Concern: Food Insecurity - Food Insecurity Present (07/22/2023)  Housing: Low Risk  (09/09/2023)  Transportation Needs: No Transportation Needs (09/09/2023)  Recent Concern: Transportation Needs - Unmet Transportation Needs (07/22/2023)  Utilities: Not At Risk (09/09/2023)  Alcohol Screen: Low Risk  (02/15/2023)  Depression (PHQ2-9): Low Risk  (02/14/2023)  Financial Resource Strain: Low Risk  (05/09/2023)   Received from [facility]  Recent Concern: Financial Resource Strain - Medium Risk  (02/15/2023)  Physical Activity: Inactive (02/15/2023)  Social Connections: Socially Isolated (02/15/2023)  Stress: Stress Concern Present (02/15/2023)  Tobacco Use: Medium Risk (09/08/2023)    Readmission Risk Interventions    09/10/2023    1:52 PM 08/22/2023   10:03 PM 08/04/2023    8:47 AM  Readmission Risk Prevention Plan  Transportation Screening Complete Complete   PCP or Specialist Appt within 3-5 Days Complete Complete Complete  HRI or Home Care Consult Complete Complete   Social Work Consult for Recovery Care Planning/Counseling Complete --   Palliative Care Screening Not Applicable Not Applicable   Medication Review Oceanographer) Complete Complete

## 2023-09-30 NOTE — Progress Notes (Signed)
PROGRESS NOTE    Sheryl Silva  ZOX:[id number] DOB: 1976/08/24 DOA: 09/08/2023 PCP: [name], MD  201A/201A-AA  LOS: 21 days   Brief hospital course: Sheryl Silva is a 47 y.o. 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  Also with recent hospitalization for CMV colitis and has been managed on valganciclovir by ID presented to the emergency room with abnormal labs, generalized abdominal pain, nausea, poor appetite.   S/p chole drain placement.  Worsening abdominal pain difficult to control, she was requesting IV pain meds (although she hadn't maximized her home oxycodone), later started on Dilaudid PCA which has since weaned off.  Palliative care was consulted.  Pt had to be transferred to stepdown for 3% for hyponatremia of 119. Nephro was consulted and pt is currently on NaCl tabs and fluid restriction as per nephro.   Assessment & Plan:   Acute cholecystitis  S/p chole tube Concern of acute cholecystitis with history of stage IV signet cell adenocarcinoma, general surgery was consulted but she is not a good candidate for any surgical intervention. S/p cholecystostomy drain placement by IR on 11/8, draining bile, apparently cultures were not sent so they were ordered next day.  Improving transaminitis. --completed 14 days of zosyn Plan: --cont drain management - gen surg f/u 6 weeks, ir f/u prn for drain issues   left groin pain likely due to ovarian mass Complaining of worsening pain was started on PCA dilaudid on 11/9, however, pain does not appear to be from upper abdomen.  possibly due to left adnexal mass that caused torsion and cut off blood  supply to ovary.  US pelvic with doppler, which showed known left ovary mass with reduced blood flow. --Onc palliative care consulted, started on Fentanyl patch.  Weaned off PCA dilaudid --GynOnc consulted, did not rec surgery. Plan: --pain management per onc palliative care provider   Cancer pain Stage IV metastatic signet ring adenocarcinoma: --Pt reported pain still uncontrolled despite being on a large amount of opioids --switched from Fentanyl patch to oxycontin 60 mg BID with better pain control --cont oxycontin 60 mg BID and oxycodone 15 mg PRN. 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  Most recent CMV DNA from 09/08/23 was neg.  Pt saw ID Dr. Rivka Safer on 09/08/23, and rec holding ganciclovir while not on chemo.  But if she plans to start chemo she will have to be on suppressive therapy with 900mg  VALGanciclovir every day.  Anemia: --s/p 2u pRBC so far --monitor Hgb and transfuse to keep Hgb >7  Hyponatremia 2/2 SIADH --persistent since Aug 2024, nadir-ed to 119 on 09/18/23.  Did improve with 3% saline, however, Na dropped back down to 122 after stopping 3% saline. --Cannot give tolvaptan given transaminitis.  --Na improving on current regimen Plan: --cont salt tab --lasix 20 mg daily --cont fluid restriction 1500 ml daily  PAD with ischemic left 2nd toe --painful and blackened toe --ABI showed Left ankle-brachial index of 0.60, consistent with moderate arterial occlusive disease. --angiogram on 11/26 with stent placement --cont ASA and plavix --f/u with vascular in 3 months  Constipation, resolved  Hypokalemia --monitor and supplement PRN  Elevated temp --has been having tempt around 100 for the past couple  of days, not quite a fever. --blood cx obtained.  Covid neg Plan: --avoid scheduled tylenol in order to monitor for fever  Tachycardia --likely due to demand and cancer --increase lopressor to 50 mg  BID   DVT prophylaxis: Lovenox SQ Code Status: Full code  Family Communication:  Level of care: Telemetry Medical Dispo:   The patient is from: home Anticipated d/c is to: SNF rehab Anticipated d/c date is: whenever bed available   Subjective and Interval History:  Pt reported pain better controlled on oxycontin.  TOC to help with looking for SNF in [place].   Objective: Vitals:   09/30/23 0500 09/30/23 0844 09/30/23 1655 09/30/23 1931  BP:  (!) 137/98 (!) 152/111 (!) 159/106  Pulse:  (!) 122 (!) 122 (!) 130  Resp:  17  (!) 24  Temp:  99.4 F (37.4 C) 98.3 F (36.8 C) (!) 97.5 F (36.4 C)  TempSrc:      SpO2:  99% 100% 98%  Weight: 70.9 kg     Height:        Intake/Output Summary (Last 24 hours) at 09/30/2023 1941 Last data filed at 09/29/2023 2300 Gross per 24 hour  Intake 200 ml  Output 100 ml  Net 100 ml   Filed Weights   09/26/23 0500 09/27/23 0500 09/30/23 0500  Weight: 72.3 kg 70.5 kg 70.9 kg    Examination:   Constitutional: NAD, AAOx3 HEENT: conjunctivae and lids normal, EOMI CV: No cyanosis.   RESP: normal respiratory effort, on [place] Neuro: II - XII grossly intact.   Psych: depressed mood and affect.  Appropriate judgement and reason   Data Reviewed: I have personally reviewed labs and imaging studies  Time spent: 35 minutes  Darlin Priestly, MD [facility] If 7PM-7AM, please contact night-coverage 09/30/2023, 7:41 PM

## 2023-09-30 NOTE — Evaluation (Signed)
Physical Therapy Re-Evaluation Patient Details Name: Sheryl Silva MRN: [id number] DOB: 04-07-1976 Today's Date: 09/30/2023  History of Present Illness  Pt is a 47 y.o. female admitted with acute cholecystitis (s/p cole tube drain placed 11/8), L groin pain due to ovarian mass, s/p LLE angiogram 11/26.   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 Also with recent hospitalization for CMV colitis. Pt transferred to CCU 09/19/23.  S/p angio with intervention 09/27/23.  Clinical Impression  Pt seen for PT re-evaluation.  Prior to recent medical concerns, pt was ambulatory.  9/10 generalized body pain reported during session (nurse present end of session and aware).  Currently pt is CGA with transfers and ambulation 40 feet with RW use.  Limited distance ambulating d/t pt fatigue/SOB (SpO2 sats 90% on 3 L O2 via nasal cannula post ambulation with HR 130 bpm; SpO2 sats 92% or greater on 3 L O2 and HR around 116 bpm at rest).  Pt would currently benefit from skilled PT to address noted impairments and functional limitations (see below for any additional details).  Upon hospital discharge, pt would benefit from ongoing therapy.  PT POC reviewed and updated.    If plan is discharge home, recommend the following: A little help with walking and/or transfers;A little help with bathing/dressing/bathroom;Assistance with cooking/housework;Assist for transportation;Help with stairs or ramp for entrance   Can travel by private vehicle        Equipment Recommendations Rolling walker (2 wheels);BSC/3in1;Wheelchair (measurements PT);Wheelchair cushion (measurements PT)  Recommendations for Other Services       Functional Status Assessment Patient has had  a recent decline in their functional status and demonstrates the ability to make significant improvements in function in a reasonable and predictable amount of time.     Precautions / Restrictions Precautions Precautions: Fall Precaution Comments: R biliary drain; R chest port; s/p LLE angiogram (okay to mobilize per Dr. Ashok Pall 11/27) Restrictions Weight Bearing Restrictions: No      Mobility  Bed Mobility               General bed mobility comments: Deferred (pt in recliner beginning/end of session)    Transfers Overall transfer level: Needs assistance Equipment used: Rolling walker (2 wheels) Transfers: Sit to/from Stand Sit to Stand: Contact guard assist           General transfer comment: mild increased effort to stand from recliner up to RW; vc's for UE placement    Ambulation/Gait Ambulation/Gait assistance: Contact guard assist Gait Distance (Feet): 40 Feet Assistive device: Rolling walker (2 wheels) Gait Pattern/deviations: Step-through pattern, Decreased step length - right, Decreased step length - left       General Gait Details: limited d/t SOB  Stairs            Wheelchair Mobility     Tilt Bed    Modified Rankin (Stroke Patients Only)       Balance Overall balance assessment: Needs assistance Sitting-balance support: No upper extremity supported, Feet supported Sitting balance-Leahy Scale: Good Sitting balance - Comments: steady reaching within BOS   Standing balance support: During functional activity, Bilateral upper extremity supported, Reliant on assistive device for balance Standing balance-Leahy Scale: Good Standing balance comment: steady ambulating with RW use  Pertinent Vitals/Pain Pain Assessment Pain Assessment: 0-10 Pain Score: 9  Pain Location: Everywhere Pain Descriptors / Indicators: Discomfort, Sore Pain Intervention(s): Limited activity within patient's tolerance,  Monitored during session, Premedicated before session, Repositioned (RN present end of session giving pt pain medication)    Home Living Family/patient expects to be discharged to:: Private residence Living Arrangements: Alone Available Help at Discharge: Available 24 hours/day;Friend(s);Other (Comment) (significant other) Type of Home: Apartment Home Access: Stairs to enter Entrance Stairs-Rails: Right;Left Entrance Stairs-Number of Steps: flight   Home Layout: One level Home Equipment: Agricultural consultant (2 wheels);Cane - single point      Prior Function Prior Level of Function : Independent/Modified Independent             Mobility Comments: Uses SPC at baseline/daily; uses RW when she feels the need to. Pt denies hx of falls, does not drive. ADLs Comments: Mod independent to independent (depends on day).     Extremity/Trunk Assessment   Upper Extremity Assessment Upper Extremity Assessment: Generalized weakness    Lower Extremity Assessment Lower Extremity Assessment: Generalized weakness    Cervical / Trunk Assessment Cervical / Trunk Assessment: Normal  Communication   Communication Communication: No apparent difficulties Cueing Techniques: Verbal cues  Cognition Arousal: Alert Behavior During Therapy: WFL for tasks assessed/performed Overall Cognitive Status: Within Functional Limits for tasks assessed                                          General Comments  Nursing cleared pt for participation in physical therapy.  Pt agreeable to PT session with encouragement.  Pt's fiance present in room during session.    Exercises     Assessment/Plan    PT Assessment Patient needs continued PT services  PT Problem List Decreased strength;Decreased mobility;Decreased balance;Decreased activity tolerance;Pain;Cardiopulmonary status limiting activity       PT Treatment Interventions DME instruction;Balance training;Gait training;Stair  training;Functional mobility training;Patient/family education;Therapeutic activities;Therapeutic exercise    PT Goals (Current goals can be found in the Care Plan section)  Acute Rehab PT Goals Patient Stated Goal: Walk without pain PT Goal Formulation: With patient Time For Goal Achievement: 10/14/23 Potential to Achieve Goals: Fair    Frequency Min 1X/week     Co-evaluation               AM-PAC PT "6 Clicks" Mobility  Outcome Measure Help needed turning from your back to your side while in a flat bed without using bedrails?: None Help needed moving from lying on your back to sitting on the side of a flat bed without using bedrails?: A Little Help needed moving to and from a bed to a chair (including a wheelchair)?: A Little Help needed standing up from a chair using your arms (e.g., wheelchair or bedside chair)?: A Little Help needed to walk in hospital room?: A Little Help needed climbing 3-5 steps with a railing? : A Lot 6 Click Score: 18    End of Session Equipment Utilized During Treatment: Oxygen (3 L via nasal cannula) Activity Tolerance: Other (comment) (limited d/t SOB with activity) Patient left: in chair;with call bell/phone within reach;with nursing/sitter in room;with family/visitor present;Other (comment) (Pt's fiance present) Nurse Communication: Mobility status;Precautions;Other (comment) (pt's vitals and SOB with activity; nurse present and aware of pt's pain status; nurse cleared pt to stay in chair without chair alarm) PT Visit Diagnosis: Muscle weakness (generalized) (M62.81);Pain;Unsteadiness  on feet (R26.81);Difficulty in walking, not elsewhere classified (R26.2) Pain - part of body:  (Everywhere)    Time: 9563-8756 PT Time Calculation (min) (ACUTE ONLY): 20 min   Charges:   PT Evaluation $PT Re-evaluation: 1 Re-eval   PT General Charges $$ ACUTE PT VISIT: 1 Visit        Hendricks Limes, PT 09/30/23, 1:02 PM

## 2023-10-01 DIAGNOSIS — G893 Neoplasm related pain (acute) (chronic): Secondary | ICD-10-CM | POA: Diagnosis not present

## 2023-10-01 NOTE — Plan of Care (Signed)

## 2023-10-01 NOTE — TOC Progression Note (Addendum)
Transition of Care [facility]) - Progression Note    Patient Details  Name: Sheryl Silva MRN: [id number] Date of Birth: 09-13-1976  [facility] CM/SW Contact  Liliana Cline, LCSW Phone Number: 10/01/2023, 12:25 PM  Clinical Narrative:    Met with patient at bedside. She states she would like a SNF in either [address] or [place]. She is not sure of her top 3 choices. Provided new list of SNFs within 5 miles of [place] from [place].gov for patient to choose top 3. No SNFs with [facility] address listed.        Expected Discharge Plan and Services                                               Social Determinants of Health (SDOH) Interventions SDOH Screenings   Food Insecurity: No Food Insecurity (09/09/2023)  Recent Concern: Food Insecurity - Food Insecurity Present (07/22/2023)  Housing: Low Risk  (09/09/2023)  Transportation Needs: No Transportation Needs (09/09/2023)  Recent Concern: Transportation Needs - Unmet Transportation Needs (07/22/2023)  Utilities: Not At Risk (09/09/2023)  Alcohol Screen: Low Risk  (02/15/2023)  Depression (PHQ2-9): Low Risk  (02/14/2023)  Financial Resource Strain: Low Risk  (05/09/2023)   Received from [facility], [facility]  Recent Concern: Financial Resource Strain - Medium Risk (02/15/2023)  Physical Activity: Inactive (02/15/2023)  Social Connections: Socially Isolated (02/15/2023)  Stress: Stress Concern Present (02/15/2023)  Tobacco Use: Medium Risk (09/08/2023)    Readmission Risk Interventions    09/10/2023    1:52 PM 08/22/2023   10:03 PM 08/04/2023    8:47 AM  Readmission Risk Prevention Plan  Transportation Screening Complete Complete   PCP or Specialist Appt within 3-5 Days Complete Complete Complete  HRI or Home Care Consult Complete Complete   Social Work Consult for Recovery Care Planning/Counseling Complete --   Palliative Care Screening Not  Applicable Not Applicable   Medication Review Oceanographer) Complete Complete

## 2023-10-01 NOTE — Progress Notes (Signed)
PROGRESS NOTE    Sheryl Silva  VZD:[id number] DOB: Aug 02, 1976 DOA: 09/08/2023 PCP: [name], MD  [id number]/[id number]-AA  LOS: 22 days   Brief hospital course: Sheryl Silva is a 47 y.o. 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  Also with recent hospitalization for CMV colitis and has been managed on valganciclovir by ID presented to the emergency room with abnormal labs, generalized abdominal pain, nausea, poor appetite.   S/p chole drain placement.  Worsening abdominal pain difficult to control, she was requesting IV pain meds (although she hadn't maximized her home oxycodone), later started on Dilaudid PCA which has since weaned off.  Palliative care was consulted.  Pt had to be transferred to stepdown for 3% for hyponatremia of 119. Nephro was consulted and pt is currently on NaCl tabs and fluid restriction as per nephro.  Na improving.    Assessment & Plan:   Acute cholecystitis  S/p chole tube Concern of acute cholecystitis with history of stage IV signet cell adenocarcinoma, general surgery was consulted but she is not a good candidate for any surgical intervention. S/p cholecystostomy drain placement by IR on 11/8, draining bile, apparently cultures were not sent so they were ordered next day.  Improving transaminitis. --completed 14 days of zosyn Plan: --cont drain management - gen surg f/u 6 weeks, ir f/u prn for drain issues   left groin pain likely due to ovarian mass Complaining of worsening pain was started on PCA dilaudid on 11/9, however, pain does not appear to be from upper abdomen.  possibly due to left adnexal mass that caused torsion and  cut off blood supply to ovary.  US pelvic with doppler, which showed known left ovary mass with reduced blood flow. --Onc palliative care consulted, started on Fentanyl patch.  Weaned off PCA dilaudid --GynOnc consulted, did not rec surgery. Plan: --pain management per onc palliative care provider   Cancer pain Stage IV metastatic signet ring adenocarcinoma: --Pt reported pain still uncontrolled despite being on a large amount of opioids --switched from Fentanyl patch to oxycontin 60 mg BID with better pain control Plan: --cont oxycontin 60 mg BID and oxycodone 15 mg PRN.  Anxiety --cont Xanax PRN   H/O CMV colitis: Pt was on valganciclovir.  Most recent CMV DNA from 09/08/23 was neg.  Pt saw ID Dr. Rivka Safer on 09/08/23, and rec holding ganciclovir while not on chemo.  But if she plans to start chemo she will have to be on suppressive therapy with 900mg  VALGanciclovir every day.  Anemia: --s/p 2u pRBC so far --monitor Hgb and transfuse to keep Hgb >7  Hyponatremia 2/2 SIADH --persistent since Aug 2024, nadir-ed to 119 on 09/18/23.  Did improve with 3% saline, however, Na dropped back down to 122 after stopping 3% saline. --Cannot give tolvaptan given transaminitis.  --Na improving on current regimen, now low 130's. Plan: --cont salt tab --lasix 20 mg daily --cont fluid restriction 1500 ml daily  PAD with ischemic left 2nd toe S/p angiogram on 11/26 with stent placement --painful and blackened toe --ABI showed Left ankle-brachial index of 0.60, consistent with moderate arterial occlusive disease.   Plan: --cont ASA and plavix --f/u with vascular in 3 months  Constipation, resolved  Hypokalemia --monitor and supplement PRN  Elevated temp,  resolved --blood cx neg.  Covid neg Plan: --avoid scheduled tylenol in order to monitor for fever  Tachycardia  --likely due to demand and cancer --cont Lopressor    DVT prophylaxis: Lovenox SQ Code Status: Full code  Family  Communication:  Level of care: Telemetry Medical Dispo:   The patient is from: home Anticipated d/c is to: SNF rehab.  Pt now wants to find a SNF in [place] to be closer to her family.  TOC is working on this. Anticipated d/c date is: whenever bed available   Subjective and Interval History:  Pt reported pain managed now, but continued to complain of abdominal soreness.  Pt has been wearing 3L O2 due to dyspnea.   Objective: Vitals:   10/01/23 1211 10/01/23 1602 10/01/23 1942 10/01/23 2111  BP: (!) 150/97 (!) 147/89 (!) 155/99 (!) 151/89  Pulse: (!) 123 (!) 126 (!) 106 (!) 125  Resp: 18 18 20    Temp: 98.3 F (36.8 C) 98.1 F (36.7 C) 98.7 F (37.1 C)   TempSrc:   Oral   SpO2: 100% 100% 100%   Weight:      Height:        Intake/Output Summary (Last 24 hours) at 10/01/2023 2113 Last data filed at 10/01/2023 0134 Gross per 24 hour  Intake 5 ml  Output 50 ml  Net -45 ml   Filed Weights   09/30/23 0500 10/01/23 0348 10/01/23 0502  Weight: 70.9 kg 71.2 kg 71.2 kg    Examination:   Constitutional: NAD, AAOx3 HEENT: conjunctivae and lids normal, EOMI CV: No cyanosis.   RESP: normal respiratory effort Neuro: II - XII grossly intact.   Psych: depressed mood and affect.     Data Reviewed: I have personally reviewed labs and imaging studies  Time spent: 35 minutes  Darlin Priestly, MD [facility] If 7PM-7AM, please contact night-coverage 10/01/2023, 9:13 PM

## 2023-10-02 DIAGNOSIS — K810 Acute cholecystitis: Secondary | ICD-10-CM | POA: Diagnosis not present

## 2023-10-02 LAB — GLUCOSE, CAPILLARY: Glucose-Capillary: 98 mg/dL (ref 70–99)

## 2023-10-02 NOTE — Hospital Course (Addendum)
HPI: Sheryl Silva is a 47 y.o. 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  Also with recent hospitalization for CMV colitis and has been managed on valganciclovir by ID presented to the emergency room with abnormal labs, generalized abdominal pain, nausea, poor appetite.     Hospital course / significant events:  11/07: admitted to hospitalist service w/ acute cholecystitis  11/08: chole drain placed  Worsening abdominal pain difficult to control, she was requesting IV pain meds (although she hadn't maximized her home oxycodone), later started on Dilaudid PCA which has since weaned off.  Palliative care was consulted.   11/18: hyponatremia to 119, to SDU for hypertonic saline, nephrology consulted  Sodium improving/stable Awaiting SNF in [place] to be closer to family, TOC working on this see notes from today 10/03/23  12/02: temp and HR up, WBC 12.6, infectious w/u   Consultants:  General Surgery Oncology Palliative Care Interventional Radiology Infectious Disease  Nephrology Vascular Surgery  GYN Oncology  Procedures/Surgeries: 11/08: Chole tube placement 10 Fr to bag drain - Dr. Juliette Alcide (IR) 11/26: angioplasty and stent placement left common iliac artery - Dr Gilda Crease (Vascular)      ASSESSMENT & PLAN:   Fever, malaise, leukocytosis (+)SIRS Pt denies SOB/cough, dysuria, diarrhea, rash, sinus congestion / sore throat, headache   CXR BCx UA w/ reflex to culture Follow CBC/BMP in am   Acute cholecystitis  S/p chole tube 11/08 Concern of acute cholecystitis complicated by history of stage IV GI signet cell adenocarcinoma completed 14 days of  zosyn not a good candidate for surgical intervention. S/p cholecystostomy drain placement by IR on 11/8 cont drain management gen surg f/u 6 weeks IR f/u prn for drain issues    left groin pain likely due to ovarian mass Complaining of worsening pain was started on PCA dilaudid on 11/9, however, pain does not appear to be from upper abdomen.  possibly due to left adnexal mass that caused torsion and cut off blood supply to ovary.  US pelvic with doppler, which showed known left ovary mass with reduced blood flow. Onc palliative care consulted GynOnc consulted, did not rec surgery. Continue pain management in cooperation w/ onc palliative care provider   Cancer pain Stage IV metastatic signet ring adenocarcinoma: Pt reported pain still uncontrolled despite being on a high dose opioids Fentanyl patch switched to oxycontin 60 mg BID with better pain control cont oxycontin 60 mg BID and oxycodone 15 mg PRN. Palliative care following    Anxiety cont Xanax PRN   H/O CMV colitis: Pt was on valganciclovir.  Most recent CMV DNA from 09/08/23 was neg.  Pt saw ID Dr. Rivka Safer on 09/08/23,  holding ganciclovir while not on chemo.  But if she plans to start chemo she will have to be on suppressive therapy with 900mg  VALGanciclovir every day.   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 Na improving on current regimen, now low 130's. Cannot give tolvaptan given transaminitis.  cont salt tab lasix 20 mg daily cont fluid restriction 1500 ml daily  PAD with ischemic left 2nd toe S/p angiogram on 11/26 with stent placement ABI showed Left ankle-brachial index of 0.60, consistent with moderate arterial occlusive disease.   ASA and plavix f/u with vascular in 3 months    Constipation, resolved Bowel regimen as needed  Hypokalemia Replace as needed Monitor BMP   Elevated temp, resolved blood cx neg.  Covid neg avoid scheduled tylenol in order to monitor for fever   Tachycardia  likely due to demand and cancer cont Lopressor     Normal weight based on BMI: Body mass index is 24.98 kg/m.  Underweight - under 18.5  normal weight - 18.5 to 24.9 overweight - 25 to 29.9 obese - 30 or more   DVT prophylaxis: lovenox IV fluids: no continuous IV fluids  Nutrition: regular diet, fluid restricted  Central lines / invasive devices: biliary tube  Code Status: FULL CODE ACP documentation reviewed: 10/02/23 and none on file in [facility]  TOC needs: placement Barriers to dispo / significant pending items: placement

## 2023-10-02 NOTE — Progress Notes (Addendum)
PAC needle due to be reaccessed today.  D/W Dr Lyn Hollingshead, pt for dc to SNF and labs due in the am.  Dr Lyn Hollingshead states ok to deaccess Monday AM after labs and leave deaccessed.  No IV meds ordered. No need for dressing change until deaccess in am.

## 2023-10-02 NOTE — TOC Progression Note (Addendum)
Transition of Care [facility]) - Progression Note    Patient Details  Name: Sheryl Silva MRN: [id number] Date of Birth: [birth year]  Transition of [facility]) CM/SW Contact  Liliana Cline, LCSW Phone Number: 10/02/2023, 11:12 AM  Clinical Narrative:    Sent Medicare Care Compare list by email to patient's daughter Sheryl Silva per her request. Patient and daughter will review the options in [place]. They are aware we need the top 3 choices in [place] that CSW can reach out to them. Sheryl Silva states she lives in [place] and patient wants to be closer to her. Lamoni inquired about transport to a [place] SNF, explained that insurance would likely not cover this and that for safety EMS would likely be recommended. Patient and Sheryl Silva state they would feel comfortable taking patient by private vehicle with an oxygen tank if possible. Lamoni and patient are aware that patient has a bed offer at a SNF in [place], they prefer [place].  Also received VM from Lauren with [employer] [employer] [phone]) stating the expedited appeal was denied due to patient not being medically stable for transfer to SNF per [employer]'s MD "due to high levels of pain and hypoxia and tachycardia during PT." TOC will restart auth when appropriate.   3:25- Call from daughter Sheryl Silva. She states the only SNF in [place] they are currently interested in is [facility] [phone]). CSW called [facility], left VM for Admissions requesting a return call.         Expected Discharge Plan and Services                                               Social Determinants of Health (SDOH) Interventions SDOH Screenings   Food Insecurity: No Food Insecurity (09/09/2023)  Recent Concern: Food Insecurity - Food Insecurity Present (07/22/2023)  Housing: Low Risk  (09/09/2023)  Transportation Needs: No Transportation Needs (09/09/2023)  Recent Concern: Transportation Needs - Unmet Transportation Needs  (07/22/2023)  Utilities: Not At Risk (09/09/2023)  Alcohol Screen: Low Risk  (02/15/2023)  Depression (PHQ2-9): Low Risk  (02/14/2023)  Financial Resource Strain: Low Risk  (05/09/2023)   Received from [facility], [facility]  Recent Concern: Financial Resource Strain - Medium Risk (02/15/2023)  Physical Activity: Inactive (02/15/2023)  Social Connections: Socially Isolated (02/15/2023)  Stress: Stress Concern Present (02/15/2023)  Tobacco Use: Medium Risk (09/08/2023)    Readmission Risk Interventions    09/10/2023    1:52 PM 08/22/2023   10:03 PM 08/04/2023    8:47 AM  Readmission Risk Prevention Plan  Transportation Screening Complete Complete   PCP or Specialist Appt within 3-5 Days Complete Complete Complete  HRI or Home Care Consult Complete Complete   Social Work Consult for Recovery Care Planning/Counseling Complete --   Palliative Care Screening Not Applicable Not Applicable   Medication Review Oceanographer) Complete Complete

## 2023-10-02 NOTE — Progress Notes (Signed)
PROGRESS NOTE    Sheryl Silva   ION:[id number] DOB: [birth year]  DOA: 09/08/2023 Date of Service: 10/02/23 which is hospital day 23  PCP: [name], MD    HPI: Sheryl Silva is a 47 y.o. 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  Also with recent hospitalization for CMV colitis and has been managed on valganciclovir by ID presented to the emergency room with abnormal labs, generalized abdominal pain, nausea, poor appetite.     Hospital course / significant events:  11/07: admitted to hospitalist service w/ acute cholecystitis  11/08: chole drain placed  Worsening abdominal pain difficult to control, she was requesting IV pain meds (although she hadn't maximized her home oxycodone), later started on Dilaudid PCA which has since weaned off.  Palliative care was consulted.   11/18: hyponatremia to 119, to SDU for hypertonic saline, nephrology consulted  Sodium improving/stable Awaiting SNF in [place] to be closer to family, TOC working on this    Consultants:  General Surgery Oncology Palliative Care Interventional Radiology Infectious Disease  Nephrology Vascular Surgery  GYN Oncology  Procedures/Surgeries: 11/08: Chole tube placement 10 Fr to bag drain - Dr. Juliette Alcide (IR) 11/26: angioplasty and stent placement left common iliac artery - Dr Gilda Crease (Vascular)      ASSESSMENT & PLAN:   Acute cholecystitis  S/p chole tube 11/08 Concern of acute cholecystitis complicated by history of stage IV GI signet cell adenocarcinoma completed 14 days of zosyn not a good candidate for surgical intervention. S/p cholecystostomy drain placement  by IR on 11/8 cont drain management gen surg f/u 6 weeks IR f/u prn for drain issues    left groin pain likely due to ovarian mass Complaining of worsening pain was started on PCA dilaudid on 11/9, however, pain does not appear to be from upper abdomen.  possibly due to left adnexal mass that caused torsion and cut off blood supply to ovary.  US pelvic with doppler, which showed known left ovary mass with reduced blood flow. Onc palliative care consulted GynOnc consulted, did not rec surgery. Continue pain management in cooperation w/ onc palliative care provider   Cancer pain Stage IV metastatic signet ring adenocarcinoma: Pt reported pain still uncontrolled despite being on a high dose opioids Fentanyl patch switched to oxycontin 60 mg BID with better pain control cont oxycontin 60 mg BID and oxycodone 15 mg PRN. Palliative care following    Anxiety cont Xanax PRN   H/O CMV colitis: Pt was on valganciclovir.  Most recent CMV DNA from 09/08/23 was neg.  Pt saw ID Dr. Rivka Safer on 09/08/23,  holding ganciclovir while not on chemo.  But if she plans to start chemo she will have to be on suppressive therapy with 900mg  VALGanciclovir every day.   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 Na improving on current regimen, now low 130's. Cannot give tolvaptan given transaminitis.  cont salt tab lasix 20 mg daily cont fluid restriction 1500 ml daily   PAD with ischemic left 2nd toe S/p angiogram on 11/26 with stent placement ABI  showed Left ankle-brachial index of 0.60, consistent with moderate arterial occlusive disease.   ASA and plavix f/u with vascular in 3 months   Constipation, resolved Bowel regimen as needed  Hypokalemia Replace as needed Monitor BMP   Elevated temp,  resolved blood cx neg.  Covid neg avoid scheduled tylenol in order to monitor for fever   Tachycardia  likely due to demand and cancer cont Lopressor     Normal weight based on BMI: Body mass index is 24.98 kg/m.  Underweight - under 18.5  normal weight - 18.5 to 24.9 overweight - 25 to 29.9 obese - 30 or more   DVT prophylaxis: lovenox IV fluids: no continuous IV fluids  Nutrition: regular diet, fluid restricted  Central lines / invasive devices:   Code Status: FULL CODE ACP documentation reviewed: 10/02/23 and none on file in [facility]  TOC needs: placement Barriers to dispo / significant pending items: placement              Subjective / Brief ROS:  Patient reports pain, no change from her usual and she is due for medication Denies CP/SOB.  Pain controlled fairly well overall but still has significant pain chronically Denies new weakness.  Tolerating diet.  Reports no concerns w/ urination/defecation.   Family Communication: support person at bedside on rounds     Objective Findings:  Vitals:   10/02/23 0330 10/02/23 0500 10/02/23 0755 10/02/23 1141  BP: (!) 133/99  (!) 151/101 (!) 146/86  Pulse:   (!) 113 (!) 108  Resp:      Temp:   98.6 F (37 C) (!) 97.5 F (36.4 C)  TempSrc:   Oral   SpO2:   100% 100%  Weight:  70.2 kg    Height:        Intake/Output Summary (Last 24 hours) at 10/02/2023 1413 Last data filed at 10/02/2023 1610 Gross per 24 hour  Intake 240 ml  Output 200 ml  Net 40 ml   Filed Weights   10/01/23 0348 10/01/23 0502 10/02/23 0500  Weight: 71.2 kg 71.2 kg 70.2 kg    Examination:  Physical Exam Constitutional:      General: She is not in acute distress. Cardiovascular:     Rate and Rhythm: Normal rate and regular rhythm.  Pulmonary:     Effort: Pulmonary effort is normal.     Breath sounds: Normal breath sounds.  Abdominal:     General: Abdomen is flat.     Palpations: Abdomen is soft.  Neurological:      General: No focal deficit present.     Mental Status: She is alert. She is disoriented.  Psychiatric:        Mood and Affect: Mood normal.        Behavior: Behavior normal.          Scheduled Medications:   aspirin EC  81 mg Oral Daily   Chlorhexidine Gluconate Cloth  6 each Topical Daily   clopidogrel  75 mg Oral Daily   DULoxetine  60 mg Oral Daily   enoxaparin (LOVENOX) injection  40 mg Subcutaneous Q24H   feeding supplement  237 mL Oral TID BM   furosemide  20 mg Oral Daily   gabapentin  600 mg Oral TID   hydrocortisone cream   Topical BID   metoprolol tartrate  50 mg Oral BID   multivitamin with minerals  1 tablet Oral Daily   oxyCODONE  60 mg Oral Q12H   pantoprazole  40  mg Oral BID   senna  1 tablet Oral Daily   sodium chloride flush  10-40 mL Intracatheter Q12H   sodium chloride flush  5 mL Intracatheter Q8H   sodium chloride  1 g Oral TID WC    Continuous Infusions:  ondansetron (ZOFRAN) IV Stopped (09/28/23 0900)    PRN Medications:  acetaminophen **OR** acetaminophen, albuterol, ALPRAZolam, iodixanol, magnesium hydroxide, ondansetron (ZOFRAN) IV, mouth rinse, [DISCONTINUED] oxyCODONE-acetaminophen **AND** oxyCODONE, prochlorperazine, sodium chloride flush, sodium chloride flush  Antimicrobials from admission:  Anti-infectives (From admission, onward)    Start     Dose/Rate Route Frequency Ordered Stop   09/27/23 1158  ceFAZolin (ANCEF) IVPB 2g/100 mL premix        2 g 200 mL/hr over 30 Minutes Intravenous 30 min pre-op 09/27/23 1158 09/27/23 1302   09/09/23 1515  cefOXitin (MEFOXIN) 2 g in sodium chloride 0.9 % 100 mL IVPB  Status:  Discontinued        2 g 200 mL/hr over 30 Minutes Intravenous On call 09/09/23 1507 09/10/23 0929   09/09/23 0600  piperacillin-tazobactam (ZOSYN) IVPB 3.375 g        3.375 g 12.5 mL/hr over 240 Minutes Intravenous Every 8 hours 09/09/23 0303 09/23/23 0513   09/08/23 2200  piperacillin-tazobactam (ZOSYN) IVPB 3.375 g   Status:  Discontinued        3.375 g 12.5 mL/hr over 240 Minutes Intravenous Every 8 hours 09/08/23 2128 09/08/23 2139   09/08/23 2200  piperacillin-tazobactam (ZOSYN) IVPB 3.375 g        3.375 g 12.5 mL/hr over 240 Minutes Intravenous  Once 09/08/23 2139 09/09/23 0232           Data Reviewed:  I have personally reviewed the following...  CBC: Recent Labs  Lab 09/26/23 0538 09/27/23 0527 09/28/23 0310 09/30/23 0518  WBC 8.5 9.9 9.7 10.2  HGB 7.4* 7.6* 7.8* 7.2*  HCT 22.9* 23.5* 24.0* 22.6*  MCV 90.2 91.8 92.0 91.5  PLT 48* 46* 45* 39*   Basic Metabolic Panel: Recent Labs  Lab 09/26/23 0538 09/27/23 0527 09/28/23 0310 09/30/23 0518  NA 131* 131* 131* 132*  K 4.0 4.3 3.9 4.2  CL 97* 97* 99 98  CO2 25 24 24 24   GLUCOSE 100* 103* 104* 100*  BUN 13 13 13 12   CREATININE 0.43* 0.48 0.43* 0.55  CALCIUM 8.0* 8.2* 8.0* 8.2*  MG 1.8 1.9 2.1 2.0  PHOS  --   --   --  3.4   GFR: Estimated Creatinine Clearance: 80.5 mL/min (by C-G formula based on SCr of 0.55 mg/dL). Liver Function Tests: No results for input(s): "AST", "ALT", "ALKPHOS", "BILITOT", "PROT", "ALBUMIN" in the last 168 hours. No results for input(s): "LIPASE", "AMYLASE" in the last 168 hours. No results for input(s): "AMMONIA" in the last 168 hours. Coagulation Profile: No results for input(s): "INR", "PROTIME" in the last 168 hours. Cardiac Enzymes: No results for input(s): "CKTOTAL", "CKMB", "CKMBINDEX", "TROPONINI" in the last 168 hours. BNP (last 3 results) No results for input(s): "PROBNP" in the last 8760 hours. HbA1C: No results for input(s): "HGBA1C" in the last 72 hours. CBG: Recent Labs  Lab 10/02/23 0249  GLUCAP 98   Lipid Profile: No results for input(s): "CHOL", "HDL", "LDLCALC", "TRIG", "CHOLHDL", "LDLDIRECT" in the last 72 hours. Thyroid Function Tests: No results for input(s): "TSH", "T4TOTAL", "FREET4", "T3FREE", "THYROIDAB" in the last 72 hours. Anemia Panel: No results for  input(s): "VITAMINB12", "FOLATE", "FERRITIN", "TIBC", "IRON", "RETICCTPCT" in the last 72 hours. Most  Recent Urinalysis On File:     Component Value Date/Time   COLORURINE YELLOW (A) 07/22/2023 0349   APPEARANCEUR HAZY (A) 07/22/2023 0349   LABSPEC >1.046 (H) 07/22/2023 0349   PHURINE 6.0 07/22/2023 0349   GLUCOSEU NEGATIVE 07/22/2023 0349   HGBUR NEGATIVE 07/22/2023 0349   BILIRUBINUR NEGATIVE 07/22/2023 0349   KETONESUR NEGATIVE 07/22/2023 0349   PROTEINUR NEGATIVE 07/22/2023 0349   NITRITE NEGATIVE 07/22/2023 0349   LEUKOCYTESUR NEGATIVE 07/22/2023 0349   Sepsis Labs: @LABRCNTIP (procalcitonin:4,lacticidven:4) Microbiology: Recent Results (from the past 240 hour(s))  Culture, blood (Routine X 2) w Reflex to ID Panel     Status: None   Collection Time: 09/24/23 10:51 AM   Specimen: BLOOD  Result Value Ref Range Status   Specimen Description BLOOD LEFT ANTECUBITAL  Final   Special Requests   Final    BOTTLES DRAWN AEROBIC AND ANAEROBIC Blood Culture adequate volume   Culture   Final    NO GROWTH 5 DAYS Performed at [facility], [address] [place], [place] [address]    Report Status 09/29/2023 FINAL  Final  Culture, blood (Routine X 2) w Reflex to ID Panel     Status: None   Collection Time: 09/24/23 10:55 AM   Specimen: BLOOD  Result Value Ref Range Status   Specimen Description BLOOD RIGHT ANTECUBITAL  Final   Special Requests   Final    BOTTLES DRAWN AEROBIC AND ANAEROBIC Blood Culture adequate volume   Culture   Final    NO GROWTH 5 DAYS Performed at [facility], [address] [place], [place] [address]    Report Status 09/29/2023 FINAL  Final  SARS Coronavirus 2 by RT PCR (hospital order, performed in [facility] hospital lab) *cepheid single result test* Anterior Nasal Swab     Status: None   Collection Time: 09/24/23  8:30 PM   Specimen: Anterior Nasal Swab  Result Value Ref Range Status   SARS Coronavirus 2 by RT PCR NEGATIVE  NEGATIVE Final    Comment: (NOTE) SARS-CoV-2 target nucleic acids are NOT DETECTED.  The SARS-CoV-2 RNA is generally detectable in upper and lower respiratory specimens during the acute phase of infection. The lowest concentration of SARS-CoV-2 viral copies this assay can detect is 250 copies / mL. A negative result does not preclude SARS-CoV-2 infection and should not be used as the sole basis for treatment or other patient management decisions.  A negative result may occur with improper specimen collection / handling, submission of specimen other than nasopharyngeal swab, presence of viral mutation(s) within the areas targeted by this assay, and inadequate number of viral copies (<250 copies / mL). A negative result must be combined with clinical observations, patient history, and epidemiological information.  Fact Sheet for Patients:   RoadLapTop.co.za  Fact Sheet for Healthcare Providers: http://kim-miller.com/  This test is not yet approved or  cleared by the [place] FDA and has been authorized for detection and/or diagnosis of SARS-CoV-2 by FDA under an Emergency Use Authorization (EUA).  This EUA will remain in effect (meaning this test can be used) for the duration of the COVID-19 declaration under Section 564(b)(1) of the Act, 21 U.S.C. section 360bbb-3(b)(1), unless the authorization is terminated or revoked sooner.  Performed at [facility], [address], [place], [place] [address]       Radiology Studies last 3 days: No results found.     Time spent: 50 min    Sunnie Nielsen, DO [facility] 10/02/2023,  2:13 PM    Dictation software may have been used to generate the above note. Typos may occur and escape review in typed/dictated notes. Please contact Dr Lyn Hollingshead directly for clarity if needed.  Staff may message me via secure chat in Epic  but this may not receive an immediate  response,  please page me for urgent matters!  If 7PM-7AM, please contact night coverage [email or web address]

## 2023-10-02 NOTE — Plan of Care (Signed)
  Problem: Pain Management: Goal: General experience of comfort will improve Outcome: Progressing   Problem: Nutrition: Goal: Adequate nutrition will be maintained Outcome: Progressing

## 2023-10-03 ENCOUNTER — Ambulatory Visit: Payer: 59

## 2023-10-03 ENCOUNTER — Inpatient Hospital Stay: Payer: 59

## 2023-10-03 ENCOUNTER — Encounter: Payer: Self-pay | Admitting: Oncology

## 2023-10-03 ENCOUNTER — Ambulatory Visit: Payer: 59 | Admitting: Oncology

## 2023-10-03 ENCOUNTER — Other Ambulatory Visit: Payer: 59

## 2023-10-03 DIAGNOSIS — C799 Secondary malignant neoplasm of unspecified site: Secondary | ICD-10-CM | POA: Diagnosis not present

## 2023-10-03 DIAGNOSIS — K810 Acute cholecystitis: Secondary | ICD-10-CM | POA: Diagnosis not present

## 2023-10-03 LAB — URINALYSIS, W/ REFLEX TO CULTURE (INFECTION SUSPECTED)
Bilirubin Urine: NEGATIVE
Glucose, UA: NEGATIVE mg/dL
Ketones, ur: NEGATIVE mg/dL
Leukocytes,Ua: NEGATIVE
Nitrite: NEGATIVE
Protein, ur: 30 mg/dL — AB
RBC / HPF: >50 RBC/hpf (ref 0–5)
Specific Gravity, Urine: 1.029 (ref 1.005–1.030)
pH: 5 (ref 5.0–8.0)

## 2023-10-03 LAB — CBC
HCT: 23.1 % — ABNORMAL LOW (ref 36.0–46.0)
Hemoglobin: 7.3 g/dL — ABNORMAL LOW (ref 12.0–15.0)
MCH: 29.3 pg (ref 26.0–34.0)
MCHC: 31.6 g/dL (ref 30.0–36.0)
MCV: 92.8 fL (ref 80.0–100.0)
Platelets: 28 10*3/uL — CL (ref 150–400)
RBC: 2.49 MIL/uL — ABNORMAL LOW (ref 3.87–5.11)
RDW: 18.3 % — ABNORMAL HIGH (ref 11.5–15.5)
WBC: 12.6 10*3/uL — ABNORMAL HIGH (ref 4.0–10.5)
nRBC: 6.1 % — ABNORMAL HIGH (ref 0.0–0.2)

## 2023-10-03 LAB — BASIC METABOLIC PANEL
Anion gap: 11 (ref 5–15)
BUN: 19 mg/dL (ref 6–20)
CO2: 24 mmol/L (ref 22–32)
Calcium: 8 mg/dL — ABNORMAL LOW (ref 8.9–10.3)
Chloride: 95 mmol/L — ABNORMAL LOW (ref 98–111)
Creatinine, Ser: 0.49 mg/dL (ref 0.44–1.00)
GFR, Estimated: >60 mL/min (ref >60)
Glucose, Bld: 110 mg/dL — ABNORMAL HIGH (ref 70–99)
Potassium: 4.4 mmol/L (ref 3.5–5.1)
Sodium: 130 mmol/L — ABNORMAL LOW (ref 135–145)

## 2023-10-03 LAB — MAGNESIUM: Magnesium: 1.9 mg/dL (ref 1.7–2.4)

## 2023-10-03 MED ORDER — SODIUM CHLORIDE 0.9% FLUSH: 10.0000 mL | INTRAVENOUS | Status: DC | PRN

## 2023-10-03 MED ORDER — IOHEXOL 350 MG/ML SOLN
75.0000 mL | Freq: Once | INTRAVENOUS | Status: AC | PRN
  Administered 2023-10-03: 75 mL via INTRAVENOUS

## 2023-10-03 MED ORDER — HEPARIN SOD (PORK) LOCK FLUSH 100 UNIT/ML IV SOLN: 500.0000 [IU] | INTRAVENOUS | Status: DC

## 2023-10-03 MED ORDER — SODIUM CHLORIDE 0.9% FLUSH
10.0000 mL | Freq: Two times a day (BID) | INTRAVENOUS | Status: DC
  Administered 2023-10-03 – 2023-10-06 (×4): 10 mL

## 2023-10-03 MED ORDER — HEPARIN SOD (PORK) LOCK FLUSH 100 UNIT/ML IV SOLN
500.0000 [IU] | INTRAVENOUS | Status: DC | PRN
  Administered 2023-10-03: 500 [IU]

## 2023-10-03 NOTE — Progress Notes (Signed)
[facility]  Telephone:([phone]) [phone] Fax:(336) [phone]  ID: Sheryl Silva OB: 08/08/1976  MR#: [id number]  ACZ#:[id number]  Patient Care Team: [name], MD as PCP - General (Internal Medicine) Benita Gutter, RN as Oncology Nurse Navigator Orlie Dakin, Tollie Pizza, MD as Consulting Physician (Oncology) Johnnette Barrios, RN as VBCI Care Management  CHIEF COMPLAINT: Metastatic signet ring adenocarcinoma, intractable pain, hyponatremia.  INTERVAL HISTORY: Patient up in chair, appears comfortable, but continues to complain of persistent abdominal and back pain.  She continues to have significant weakness and fatigue as well as a decreased performance status  Patient offers no further specific complaints.    REVIEW OF SYSTEMS:   Review of Systems  Constitutional:  Positive for malaise/fatigue. Negative for fever and weight loss.  Respiratory: Negative.  Negative for cough, hemoptysis and shortness of breath.   Cardiovascular: Negative.  Negative for chest pain and leg swelling.  Gastrointestinal:  Positive for abdominal pain. Negative for nausea.  Genitourinary: Negative.  Negative for dysuria.  Musculoskeletal:  Positive for back pain.  Skin: Negative.  Negative for rash.  Neurological:  Positive for weakness. Negative for dizziness, focal weakness and headaches.  Psychiatric/Behavioral: Negative.  The patient is not nervous/anxious.     As per HPI. Otherwise, a complete review of systems is negative.  PAST MEDICAL HISTORY: Past Medical History:  Diagnosis Date   Anxiety    Cancer (HCC)    Depression    Heart murmur    Hypertension     PAST SURGICAL HISTORY: Past Surgical History:  Procedure Laterality Date   BIOPSY  07/26/2023   Procedure: BIOPSY;  Surgeon: Toney Reil, MD;  Location: [facility] ENDOSCOPY;  Service: Gastroenterology;;   CESAREAN SECTION     COLONOSCOPY WITH PROPOFOL N/A 02/21/2023   Procedure: COLONOSCOPY WITH PROPOFOL;   Surgeon: Regis Bill, MD;  Location: [facility] ENDOSCOPY;  Service: Endoscopy;  Laterality: N/A;   DILATION AND CURETTAGE OF UTERUS     FLEXIBLE SIGMOIDOSCOPY N/A 07/26/2023   Procedure: FLEXIBLE SIGMOIDOSCOPY;  Surgeon: Toney Reil, MD;  Location: [facility] ENDOSCOPY;  Service: Gastroenterology;  Laterality: N/A;   IR IMAGING GUIDED PORT INSERTION  02/16/2023   IR PERC CHOLECYSTOSTOMY  09/09/2023   LOWER EXTREMITY ANGIOGRAPHY Left 09/27/2023   Procedure: Lower Extremity Angiography;  Surgeon: Renford Dills, MD;  Location: [facility] INVASIVE CV LAB;  Service: Cardiovascular;  Laterality: Left;    FAMILY HISTORY: Family History  Problem Relation Age of Onset   Cancer Maternal Grandmother     ADVANCED DIRECTIVES (Y/N):  @ADVDIR @  HEALTH MAINTENANCE: Social History   Tobacco Use   Smoking status: Former    Current packs/day: 0.00    Types: Cigarettes    Quit date: 12/19/2022    Years since quitting: 0.7   Smokeless tobacco: Never   Tobacco comments:    Quit smoking 12/2022  Vaping Use   Vaping status: Never Used  Substance Use Topics   Alcohol use: Yes    Comment: occ   Drug use: No     Colonoscopy:  PAP:  Bone density:  Lipid panel:  Allergies  Allergen Reactions   Nifedipine Palpitations    Current Facility-Administered Medications  Medication Dose Route Frequency Provider Last Rate Last Admin   acetaminophen (TYLENOL) tablet 650 mg  650 mg Oral Q6H PRN Schnier, Latina Craver, MD   650 mg at 09/23/23 1037   Or   acetaminophen (TYLENOL) suppository 650 mg  650 mg Rectal Q6H PRN Schnier, Latina Craver, MD  albuterol (PROVENTIL) (2.5 MG/3ML) 0.083% nebulizer solution 2.5 mg  2.5 mg Inhalation Q6H PRN Schnier, Latina Craver, MD       ALPRAZolam Prudy Feeler) tablet 0.25 mg  0.25 mg Oral TID PRN Gilda Crease Latina Craver, MD   0.25 mg at 10/03/23 0601   aspirin EC tablet 81 mg  81 mg Oral Daily Pace, Brien R, NP   81 mg at 10/03/23 2951   Chlorhexidine Gluconate Cloth 2 % PADS 6 each   6 each Topical Daily Schnier, Latina Craver, MD   6 each at 10/02/23 1032   clopidogrel (PLAVIX) tablet 75 mg  75 mg Oral Daily Pace, Huel Cote R, NP   75 mg at 10/03/23 0853   DULoxetine (CYMBALTA) DR capsule 60 mg  60 mg Oral Daily Renford Dills, MD   60 mg at 10/03/23 0853   enoxaparin (LOVENOX) injection 40 mg  40 mg Subcutaneous Q24H Schnier, Latina Craver, MD   40 mg at 10/02/23 1031   feeding supplement (ENSURE ENLIVE / ENSURE PLUS) liquid 237 mL  237 mL Oral TID BM Schnier, Latina Craver, MD   237 mL at 10/03/23 1031   furosemide (LASIX) tablet 20 mg  20 mg Oral Daily Schnier, Latina Craver, MD   20 mg at 10/03/23 8841   gabapentin (NEURONTIN) capsule 600 mg  600 mg Oral TID Renford Dills, MD   600 mg at 10/03/23 0853   heparin lock flush 100 unit/mL  500 Units Intracatheter Q30 days Sunnie Nielsen, DO       And   heparin lock flush 100 unit/mL  500 Units Intracatheter PRN Sunnie Nielsen, DO   500 Units at 10/03/23 1037   hydrocortisone cream 1 %   Topical BID Schnier, Latina Craver, MD   Given at 10/03/23 1030   iodixanol (VISIPAQUE) 320 MG/ML injection 5 mL  5 mL Other Once PRN Schnier, Latina Craver, MD       magnesium hydroxide (MILK OF MAGNESIA) suspension 30 mL  30 mL Oral Daily PRN Schnier, Latina Craver, MD   30 mL at 09/13/23 2112   metoprolol tartrate (LOPRESSOR) tablet 50 mg  50 mg Oral BID Darlin Priestly, MD   50 mg at 10/03/23 6606   multivitamin with minerals tablet 1 tablet  1 tablet Oral Daily Schnier, Latina Craver, MD   1 tablet at 10/03/23 0853   ondansetron (ZOFRAN) 8 mg in sodium chloride 0.9 % 50 mL IVPB  8 mg Intravenous Q8H PRN Schnier, Latina Craver, MD   Stopped at 09/28/23 0900   Oral care mouth rinse  15 mL Mouth Rinse PRN Schnier, Latina Craver, MD       oxyCODONE (Oxy IR/ROXICODONE) immediate release tablet 15 mg  15 mg Oral Q4H PRN Schnier, Latina Craver, MD   15 mg at 10/03/23 0339   oxyCODONE (OXYCONTIN) 12 hr tablet 60 mg  60 mg Oral Q12H Kathrynn Running, MD   60 mg at 10/03/23 0853    pantoprazole (PROTONIX) EC tablet 40 mg  40 mg Oral BID Renford Dills, MD   40 mg at 10/03/23 3016   prochlorperazine (COMPAZINE) injection 5 mg  5 mg Intravenous Q6H PRN Schnier, Latina Craver, MD   5 mg at 09/28/23 1026   senna (SENOKOT) tablet 8.6 mg  1 tablet Oral Daily Schnier, Latina Craver, MD   8.6 mg at 10/03/23 0853   sodium chloride flush (NS) 0.9 % injection 10-40 mL  10-40 mL Intracatheter Q12H Schnier, Latina Craver, MD  10 mL at 10/03/23 1030   sodium chloride flush (NS) 0.9 % injection 10-40 mL  10-40 mL Intracatheter PRN Schnier, Latina Craver, MD       sodium chloride flush (NS) 0.9 % injection 3 mL  3 mL Intravenous PRN Schnier, Latina Craver, MD       sodium chloride flush (NS) 0.9 % injection 5 mL  5 mL Intracatheter Q8H Schnier, Latina Craver, MD   5 mL at 10/03/23 1031   sodium chloride tablet 1 g  1 g Oral TID WC Schnier, Latina Craver, MD   1 g at 10/03/23 4098   Facility-Administered Medications Ordered in Other Encounters  Medication Dose Route Frequency Provider Last Rate Last Admin   heparin lock flush 100 unit/mL  500 Units Intravenous Once Borders, Ivin Booty R, NP       sodium chloride flush (NS) 0.9 % injection 10 mL  10 mL Intracatheter PRN Jeralyn Ruths, MD   10 mL at 06/29/23 1430    OBJECTIVE: Vitals:   10/03/23 0328 10/03/23 0818  BP: (!) 160/111 (!) 157/88  Pulse: (!) 121 (!) 122  Resp: 17 18  Temp: 99 F (37.2 C) (!) 100.6 F (38.1 C)  SpO2: 96% 100%     Body mass index is 25.12 kg/m.    ECOG FS:4 - Bedbound  General: Well-developed, well-nourished, no acute distress. Eyes: Pink conjunctiva, anicteric sclera. HEENT: Normocephalic, moist mucous membranes. Lungs: No audible wheezing or coughing. Heart: Regular rate and rhythm. Abdomen: Soft, nontender, no obvious distention. Musculoskeletal: No edema, cyanosis, or clubbing. Neuro: Alert, answering all questions appropriately. Cranial nerves grossly intact. Skin: No rashes or petechiae noted. Psych: Normal  affect.   LAB RESULTS:  Lab Results  Component Value Date   NA 130 (L) 10/03/2023   K 4.4 10/03/2023   CL 95 (L) 10/03/2023   CO2 24 10/03/2023   GLUCOSE 110 (H) 10/03/2023   BUN 19 10/03/2023   CREATININE 0.49 10/03/2023   CALCIUM 8.0 (L) 10/03/2023   PROT 6.5 09/14/2023   ALBUMIN 2.4 (L) 09/14/2023   AST 30 09/14/2023   ALT 34 09/14/2023   ALKPHOS 516 (H) 09/14/2023   BILITOT 0.9 09/14/2023   GFRNONAA >60 10/03/2023   GFRAA >60 09/27/2018    Lab Results  Component Value Date   WBC 12.6 (H) 10/03/2023   NEUTROABS 3.6 09/08/2023   HGB 7.3 (L) 10/03/2023   HCT 23.1 (L) 10/03/2023   MCV 92.8 10/03/2023   PLT 28 (LL) 10/03/2023     STUDIES: PERIPHERAL VASCULAR CATHETERIZATION  Result Date: 09/27/2023 See surgical note for result.  US ARTERIAL ABI (SCREENING LOWER EXTREMITY)  Result Date: 09/24/2023 CLINICAL DATA:  47 year old female with left toe pain. EXAM: NONINVASIVE PHYSIOLOGIC VASCULAR STUDY OF BILATERAL LOWER EXTREMITIES TECHNIQUE: Evaluation of both lower extremities were performed at rest, including calculation of ankle-brachial indices with single level Doppler, pressure and pulse volume recording. COMPARISON:  None Available. FINDINGS: Right ABI:  0.97 Left ABI:  0.60 Right Lower Extremity:  Normal arterial waveforms at the ankle. Left Lower Extremity:  Monophasic arterial waveforms at the ankle. IMPRESSION: 1. Left ankle-brachial index of 0.60, consistent with moderate arterial occlusive disease. 2. Normal right ankle-brachial index. Marliss Coots, MD [facility] Radiology Electronically Signed   By: Marliss Coots M.D.   On: 09/24/2023 07:23   DG Chest Port 1 View  Result Date: 09/18/2023 CLINICAL DATA:  Metastatic adenocarcinoma, dyspnea. EXAM: PORTABLE CHEST 1 VIEW COMPARISON:  08/20/2023. FINDINGS: Unremarkable  cardiac silhouette. Right base consolidation or volume loss. Right-sided Port-A-Cath tip distal  SVC. No pneumothorax. There may be small right-sided pleural effusion. Diffuse osteoblastic metastatic disease. IMPRESSION: Right base consolidation or volume loss. Possible small right-sided diffuse osteoblastic metastatic disease. Electronically Signed   By: Layla Maw M.D.   On: 09/18/2023 10:32   US PELVIC TRANSABD W/PELVIC DOPPLER  Result Date: 09/11/2023 CLINICAL DATA:  Left groin pain. History of stage for metastatic adenocarcinoma of the GI tract. EXAM: TRANSABDOMINAL AND TRANSVAGINAL ULTRASOUND OF PELVIS DOPPLER ULTRASOUND OF OVARIES TECHNIQUE: Both transabdominal and transvaginal ultrasound examinations of the pelvis were performed. Transabdominal technique was performed for global imaging of the pelvis including uterus, ovaries, adnexal regions, and pelvic cul-de-sac. It was necessary to proceed with endovaginal exam following the transabdominal exam to visualize the uterus and ovaries. Color and duplex Doppler ultrasound was utilized to evaluate blood flow to the ovaries. COMPARISON:  CT AP 09/08/2023 FINDINGS: Uterus Measurements: 8.2 x 4.0 x 5.6 cm = volume: 96 mL. No fibroids or other mass visualized. Endometrium Thickness: 4.1 mm.  No focal abnormality visualized. Right ovary Not visualized. Left ovary Left adnexal mass and left ovary are indistinguishable measuring 5.3 x 5.9 x 5.5 cm. Pulsed Doppler evaluation of both ovaries demonstrates There is decreased color Doppler flow to the left adnexal mass. No arterial waveforms identified within left adnexal mass/left ovary. Normal venous waveforms were noted. Other findings Small volume of free fluid noted within the pelvis. IMPRESSION: 1. Left adnexal mass and left ovary are indistinguishable measuring 5.3 x 5.9 x 5.5 cm. There is decreased color Doppler flow to the left adnexal mass. No arterial waveforms identified within left adnexal mass/left ovary. Normal venous waveforms were noted. Imaging findings are indeterminate but torsion cannot  be excluded. 2. Small volume of free fluid noted within the pelvis. 3. Right ovary not visualized. Electronically Signed   By: Signa Kell M.D.   On: 09/11/2023 16:21   IR Perc Cholecystostomy  Result Date: 09/10/2023 INDICATION: Acute cholecystitis EXAM: Placement of percutaneous cholecystostomy tube using ultrasound and fluoroscopic guidance MEDICATIONS: Documented in the EMR ANESTHESIA/SEDATION: Moderate (conscious) sedation was employed during this procedure. A total of Versed 2 mg and Fentanyl 100 mcg was administered intravenously by the radiology nurse. Total intra-service moderate Sedation Time: 18 minutes. The patient's level of consciousness and vital signs were monitored continuously by radiology nursing throughout the procedure under my direct supervision. FLUOROSCOPY: Radiation Exposure Index (as provided by the fluoroscopic device): 1.5 minutes (8 mGy) COMPLICATIONS: None immediate. PROCEDURE: Informed written consent was obtained from the patient after a thorough discussion of the procedural risks, benefits and alternatives. All questions were addressed. Maximal Sterile Barrier Technique was utilized including caps, mask, sterile gowns, sterile gloves, sterile drape, hand hygiene and skin antiseptic. A timeout was performed prior to the initiation of the procedure. The patient was placed supine on the exam table. The right upper quadrant was prepped and draped in the standard sterile fashion. Ultrasound of the right upper quadrant was performed for planning purposes. This again demonstrated a distended gallbladder with wall edema and sludge, consistent with acute cholecystitis. An infracostal transhepatic approach was planned. Skin entry site was marked, and local analgesia was obtained with 1% lidocaine. Under ultrasound guidance, percutaneous access was obtained into the gallbladder via an infracostal transhepatic approach using a 21 gauge Chiba needle. Access was confirmed with visualization  of needle tip within the gallbladder lumen, and free return of bile. An 018 Nitrex wire was then advanced  through the access needle and coiled within the gallbladder lumen. A transition dilator was advanced over this wire, through which an antegrade cholecystogram was performed. Antegrade cholecystogram demonstrated appropriate location in the gallbladder lumen and a distended gallbladder with gallstones. The cystic duct was not visualized. Over an Amplatz wire, the percutaneous tract was serially dilated followed by placement of a 10 French locking multipurpose drainage catheter into the gallbladder lumen. Locking loop was formed. Additional biliary sludge was drained. Gentle hand injection of contrast material confirmed location of the gallbladder lumen. The drainage catheter was secured to the skin using silk suture and a dressing. It was placed to bag drainage. The patient tolerated the procedure well without immediate complication. IMPRESSION: Successful placement of a 10 French percutaneous cholecystostomy tube. Cholecystostomy tube placed to bag drainage. Additional follow-up recommendations per referring surgical service. Electronically Signed   By: Olive Bass M.D.   On: 09/10/2023 11:02   CT ABDOMEN PELVIS W CONTRAST  Result Date: 09/08/2023 CLINICAL DATA:  Elevated liver enzymes, nausea and diarrhea for 3 days, cholelithiasis with acute cholecystitis on previous imaging, history of metastatic adrenal carcinoma EXAM: CT ABDOMEN AND PELVIS WITH CONTRAST TECHNIQUE: Multidetector CT imaging of the abdomen and pelvis was performed using the standard protocol following bolus administration of intravenous contrast. RADIATION DOSE REDUCTION: This exam was performed according to the departmental dose-optimization program which includes automated exposure control, adjustment of the mA and/or kV according to patient size and/or use of iterative reconstruction technique. CONTRAST:  OMNIPAQUE IOHEXOL 300  MG/ML  SOLN COMPARISON:  08/21/2023, 08/20/2023 FINDINGS: Lower chest: Stable bibasilar hypoventilatory changes and scarring. No acute pleural or parenchymal lung disease. Hepatobiliary: There is a new 1 cm hypodensity within the left lobe liver, corresponding to hypoechoic lesion seen on today's ultrasound. Given history of metastatic adrenal carcinoma, new metastatic lesion is suspected. Multiple gallstones are again identified, with progressive gallbladder wall thickening and intramural edema consistent with acute cholecystitis. No evidence of choledocholithiasis. Pancreas: Unremarkable. No pancreatic ductal dilatation or surrounding inflammatory changes. Spleen: Normal in size without focal abnormality. Adrenals/Urinary Tract: The adrenals are unremarkable. The kidneys enhance normally. There is new mild left-sided hydronephrosis, with mucosal enhancement at the left UPJ suggesting obstructing mucosal lesion, reference image 43/2. No right-sided obstruction. No urinary tract calculi. The bladder is unremarkable. Stomach/Bowel: No bowel obstruction or ileus. Normal appendix right lower quadrant. No bowel wall thickening or inflammatory change. Vascular/Lymphatic: Stable atherosclerosis of the aorta. There is persistent retroperitoneal lymphadenopathy consistent with metastatic disease. Index left para-aortic lymph node image 44/2 measures 8 mm in short axis, stable. No new adenopathy. Reproductive: 5.1 x 3.9 cm left adnexal mass not appreciably changed since prior exam. Right ovary is unremarkable and stable. Stable uterine fibroid. Other: Trace pelvic free fluid unchanged. No free intraperitoneal gas. No abdominal wall hernia. Musculoskeletal: Diffuse sclerosis throughout the visualized bony structures compatible with bony metastases. No acute or pathologic fracture. Reconstructed images demonstrate no additional findings. IMPRESSION: 1. Cholelithiasis with progressive gallbladder wall thickening and intramural  edema consistent with worsening acute cholecystitis. 2. New 1 cm hypodensity within the left lobe liver, concerning for new liver metastasis. This was not evident on recent CT or MRI. 3. Persistent metastatic disease, with diffuse sclerotic bony metastases and retroperitoneal adenopathy unchanged. 4. Stable indeterminate left adnexal mass. 5. New mild left hydronephrosis, with subtle mucosal enhancement within the left ureter at the UPJ. Mucosal lesion cannot be excluded, and urologic consultation may be useful. 6.  Aortic Atherosclerosis (ICD10-I70.0). Electronically Signed  By: Sharlet Salina M.D.   On: 09/08/2023 21:43   US Abdomen Limited RUQ (LIVER/GB)  Result Date: 09/08/2023 CLINICAL DATA:  Quadrant pain for 3 days EXAM: ULTRASOUND ABDOMEN LIMITED RIGHT UPPER QUADRANT COMPARISON:  08/20/2023 FINDINGS: Gallbladder: Multiple shadowing gallstones are seen filling the gallbladder lumen. Gallbladder wall remains thickened measuring up to 8 mm, with prominent intramural edema now noted. Negative sonographic Murphy sign. Common bile duct: Diameter: 11 mm Liver: Normal liver echotexture. There is a circumscribed 1.3 cm hypoechoic area within the left lobe liver, without associated abnormality on prior CT or MRI. Portal vein is patent on color Doppler imaging with normal direction of blood flow towards the liver. Other: None. IMPRESSION: 1. Cholelithiasis, with worsening gallbladder wall thickening compatible with progressive acute cholecystitis. 2. Stable dilated common bile duct. 3. Indeterminate 1.3 cm hypoechoic area left lobe liver, not clearly seen on previous CT or MRI. Electronically Signed   By: Sharlet Salina M.D.   On: 09/08/2023 20:59    ASSESSMENT: Metastatic signet ring adenocarcinoma, intractable pain, hyponatremia.  PLAN:    Signet ring adenocarcinoma: Patient has not received chemotherapy since July 18, 2023.  Recent imaging studies suggest progressive disease.  Patient does not have  any scheduled treatments at this time.  Patient reports the plan now is to discharge to rehab facility in [place] where she can be closer to family.   Intractable pain: Appreciate palliative care input.  Continue current narcotics as prescribed. Hyponatremia: Chronic and unchanged.  Patient's hemoglobin remains decreased, but relatively stable at 130.  Continue salt tablets and fluid restriction.  Appreciate nephrology input. Anemia: Hemoglobin trended down to 7.3.  Proceed with transfusion if hemoglobin falls below 7.0.   Thrombocytopenia: Platelet count continues to trend down and is now 28.  Monitor. Disposition: Plan is to transfer to rehab facility in [place] to be near family.  No follow-up has been scheduled in the cancer center at this point.  Will follow.  Jeralyn Ruths, MD   10/03/2023 1:02 PM

## 2023-10-03 NOTE — TOC Progression Note (Addendum)
Transition of Care [facility]) - Progression Note    Patient Details  Name: Sheryl Silva MRN: [id number] Date of Birth: February 26, 1976  [facility] CM/SW Contact  Liliana Cline, LCSW Phone Number: 10/03/2023, 9:29 AM  Clinical Narrative:    Attempted call to [facility] [phone]). Left another VM for Admissions requesting a return call so that referral can be sent.  Attempted call to Lauren with [employer] [phone]) to confirm that we can re start auth when appropriate since expedited appeal was denied, left a VM requesting a return call.   3:40- Attempted another call to [facility] [employer]. Left another VM. Updated daughter Donovan Kail. Informed her we may need to go with [facility] and the SW there could work on transferring to a [place] facility if needed. Also inquired if there are any other facilities in [place] that she would be interested in, she states she is going to do more research today.        Expected Discharge Plan and Services                                               Social Determinants of Health (SDOH) Interventions SDOH Screenings   Food Insecurity: No Food Insecurity (09/09/2023)  Recent Concern: Food Insecurity - Food Insecurity Present (07/22/2023)  Housing: Low Risk  (09/09/2023)  Transportation Needs: No Transportation Needs (09/09/2023)  Recent Concern: Transportation Needs - Unmet Transportation Needs (07/22/2023)  Utilities: Not At Risk (09/09/2023)  Alcohol Screen: Low Risk  (02/15/2023)  Depression (PHQ2-9): Low Risk  (02/14/2023)  Financial Resource Strain: Low Risk  (05/09/2023)   Received from [facility], [facility]  Recent Concern: Financial Resource Strain - Medium Risk (02/15/2023)  Physical Activity: Inactive (02/15/2023)  Social Connections: Socially Isolated (02/15/2023)  Stress: Stress Concern Present (02/15/2023)  Tobacco Use: Medium Risk (09/08/2023)     Readmission Risk Interventions    09/10/2023    1:52 PM 08/22/2023   10:03 PM 08/04/2023    8:47 AM  Readmission Risk Prevention Plan  Transportation Screening Complete Complete   PCP or Specialist Appt within 3-5 Days Complete Complete Complete  HRI or Home Care Consult Complete Complete   Social Work Consult for Recovery Care Planning/Counseling Complete --   Palliative Care Screening Not Applicable Not Applicable   Medication Review Oceanographer) Complete Complete

## 2023-10-03 NOTE — Progress Notes (Signed)
Occupational Therapy Treatment Patient Details Name: Sheryl Silva MRN: [id number] DOB: 02-11-1976 Today's Date: 10/03/2023   History of present illness Pt is a 47 y.o. female admitted with acute cholecystitis (s/p cole tube drain placed 11/8), L groin pain due to ovarian mass, s/p LLE angiogram 11/26.   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 Also with recent hospitalization for CMV colitis. Pt transferred to CCU 09/19/23.  S/p angio with intervention 09/27/23.   OT comments  Chart reviewed to date, pt greeted in bed, agreeable to OT tx session targeting improved activity tolerance/Adl performance with encouragement and increased time. Pt is making slow progress towards goals, noted slight increase in assist required for bed mobility requiring MIN A, STS on this date requiring CGA-MIN A. Grooming/feeding tasks completed with SET UP, amb in room with CGA with RW. Pt will continue to benefit from ongoing OT to address deficits and to facilitate optimal ADL completion.       If plan is discharge home, recommend the following:  A little help with walking and/or transfers;A little help with bathing/dressing/bathroom;Help with stairs or ramp for entrance;Assistance with cooking/housework   Equipment Recommendations  BSC/3in1    Recommendations for Other Services      Precautions / Restrictions Precautions Precautions: Fall Precaution Comments: R biliary drain; R chest port;       Mobility Bed Mobility Overal bed mobility: Needs Assistance Bed Mobility: Supine to Sit     Supine to sit: Min assist, HOB elevated          Transfers Overall transfer level: Needs assistance Equipment used: Rolling walker (2  wheels) Transfers: Sit to/from Stand Sit to Stand: Min assist, Contact guard assist                 Balance Overall balance assessment: Needs assistance Sitting-balance support: Single extremity supported Sitting balance-Leahy Scale: Good     Standing balance support: During functional activity, Bilateral upper extremity supported, Reliant on assistive device for balance Standing balance-Leahy Scale: Good                             ADL either performed or assessed with clinical judgement   ADL Overall ADL's : Needs assistance/impaired Eating/Feeding: Set up;Sitting   Grooming: Set up;Sitting               Lower Body Dressing: Maximal assistance   Toilet Transfer: Contact guard assist;Rolling walker (2 wheels);Ambulation           Functional mobility during ADLs: Contact guard assist;Rolling walker (2 wheels) (approx 15' in the room)      Extremity/Trunk Assessment              Vision       Perception     Praxis      Cognition Arousal: Alert Behavior During Therapy: WFL for tasks assessed/performed, Flat affect Overall Cognitive Status: Impaired/Different from baseline Area of Impairment: Following commands, Safety/judgement, Awareness, Problem solving                       Following Commands: Follows one step commands consistently Safety/Judgement: Decreased awareness of deficits Awareness: Emergent Problem Solving: Slow processing, Requires verbal cues, Requires tactile cues  Exercises Other Exercises Other Exercises: edu re: importance of progressing mobility, participating in ADLs    Shoulder Instructions       General Comments BP 145/100, spo2 95% on 4L via [place], HR in the 90s post mobility;    Pertinent Vitals/ Pain       Pain Assessment Pain Assessment: Faces Faces Pain Scale: Hurts a little bit Pain Location: stomach Pain Descriptors / Indicators: Discomfort Pain Intervention(s): Monitored  during session, Repositioned, Premedicated before session  Home Living                                          Prior Functioning/Environment              Frequency  Min 1X/week        Progress Toward Goals  OT Goals(current goals can now be found in the care plan section)  Progress towards OT goals: Progressing toward goals     Plan      Co-evaluation                 AM-PAC OT "6 Clicks" Daily Activity     Outcome Measure   Help from another person eating meals?: None Help from another person taking care of personal grooming?: A Little Help from another person toileting, which includes using toliet, bedpan, or urinal?: A Little Help from another person bathing (including washing, rinsing, drying)?: A Lot Help from another person to put on and taking off regular upper body clothing?: A Little Help from another person to put on and taking off regular lower body clothing?: A Lot 6 Click Score: 17    End of Session Equipment Utilized During Treatment: Rolling walker (2 wheels);Oxygen  OT Visit Diagnosis: Other abnormalities of gait and mobility (R26.89);Muscle weakness (generalized) (M62.81)   Activity Tolerance Patient limited by fatigue   Patient Left in chair;with call bell/phone within reach   Nurse Communication Mobility status        Time: 1125-1201 OT Time Calculation (min): 36 min  Charges: OT General Charges $OT Visit: 1 Visit OT Treatments $Self Care/Home Management : 8-22 mins $Therapeutic Activity: 8-22 mins  Oleta Mouse, OTD OTR/L  10/03/23, 12:32 PM

## 2023-10-03 NOTE — Progress Notes (Signed)
Clinical Summary 2024-10 Name: Sheryl Silva, Sheryl Silva DOB: [birth year] Patient MRN: [id number] Referring Provider: Gerarda Fraction [facility] Name: Marissa Nestle Date of Last Psychiatric Consultant Review: 2023-09-01 Completed Behavioral Health Coach Visits Completed [facility] 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 No new action requested. Please reference last Care Plan for most up-to-date behavioral health recommendations. Safety Plan: No Safety Plan Date:

## 2023-10-03 NOTE — Progress Notes (Signed)
PROGRESS NOTE    Sheryl Silva   OZH:[id number] DOB: [birth year]  DOA: 09/08/2023 Date of Service: 10/03/23 which is hospital day 24  PCP: [name], MD    HPI: Sheryl Silva is a 47 y.o. 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  Also with recent hospitalization for CMV colitis and has been managed on valganciclovir by ID presented to the emergency room with abnormal labs, generalized abdominal pain, nausea, poor appetite.     Hospital course / significant events:  11/07: admitted to hospitalist service w/ acute cholecystitis  11/08: chole drain placed  Worsening abdominal pain difficult to control, she was requesting IV pain meds (although she hadn't maximized her home oxycodone), later started on Dilaudid PCA which has since weaned off.  Palliative care was consulted.   11/18: hyponatremia to 119, to SDU for hypertonic saline, nephrology consulted  Sodium improving/stable Awaiting SNF in [place] to be closer to family, TOC working on this see notes from today 10/03/23  12/02: temp and HR up, WBC 12.6, infectious w/u   Consultants:  General Surgery Oncology Palliative Care Interventional Radiology Infectious Disease  Nephrology Vascular Surgery  GYN Oncology  Procedures/Surgeries: 11/08: Chole tube placement 10 Fr to bag drain - Dr. Juliette Alcide (IR) 11/26: angioplasty and stent placement left common iliac artery - Dr Gilda Crease (Vascular)      ASSESSMENT & PLAN:   Fever, malaise, leukocytosis (+)SIRS Pt denies SOB/cough, dysuria, diarrhea, rash, sinus congestion / sore throat, headache   CXR BCx UA w/ reflex to culture Follow  CBC/BMP in am   Acute cholecystitis  S/p chole tube 11/08 Concern of acute cholecystitis complicated by history of stage IV GI signet cell adenocarcinoma completed 14 days of zosyn not a good candidate for surgical intervention. S/p cholecystostomy drain placement by IR on 11/8 cont drain management gen surg f/u 6 weeks IR f/u prn for drain issues    left groin pain likely due to ovarian mass Complaining of worsening pain was started on PCA dilaudid on 11/9, however, pain does not appear to be from upper abdomen.  possibly due to left adnexal mass that caused torsion and cut off blood supply to ovary.  US pelvic with doppler, which showed known left ovary mass with reduced blood flow. Onc palliative care consulted GynOnc consulted, did not rec surgery. Continue pain management in cooperation w/ onc palliative care provider   Cancer pain Stage IV metastatic signet ring adenocarcinoma: Pt reported pain still uncontrolled despite being on a high dose opioids Fentanyl patch switched to oxycontin 60 mg BID with better pain control cont oxycontin 60 mg BID and oxycodone 15 mg PRN. Palliative care following    Anxiety cont Xanax PRN   H/O CMV colitis: Pt was on valganciclovir.  Most recent CMV DNA from 09/08/23 was neg.  Pt saw ID Dr. Rivka Safer on 09/08/23,  holding ganciclovir while not on chemo.  But if she plans to start chemo she will have to be on suppressive therapy with 900mg  VALGanciclovir every day.   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 Na improving on current regimen, now low 130's. Cannot give tolvaptan given transaminitis.  cont salt tab lasix 20 mg daily cont fluid restriction 1500 ml daily   PAD with ischemic left 2nd toe S/p angiogram on 11/26 with  stent placement ABI showed Left ankle-brachial index of 0.60, consistent with moderate arterial occlusive disease.   ASA and plavix f/u with vascular in 3 months   Constipation, resolved Bowel regimen as needed  Hypokalemia Replace as needed Monitor BMP   Elevated temp, resolved blood cx neg.  Covid neg avoid scheduled tylenol in order to monitor for fever   Tachycardia  likely due to demand and cancer cont Lopressor     Normal weight based on BMI: Body mass index is 24.98 kg/m.  Underweight - under 18.5  normal weight - 18.5 to 24.9 overweight - 25 to 29.9 obese - 30 or more   DVT prophylaxis: lovenox IV fluids: no continuous IV fluids  Nutrition: regular diet, fluid restricted  Central lines / invasive devices: biliary tube  Code Status: FULL CODE ACP documentation reviewed: 10/02/23 and none on file in [facility]  TOC needs: placement Barriers to dispo / significant pending items: placement              Subjective / Brief ROS:  Patient reports pain about same She denies SOB/cough, dysuria, diarrhea, rash, sinus congestion / sore throat, headache   Denies new weakness - reports R shoulder pain makes it difficult to move, initially described this as weakness but on further questioning seems to be describing pain w/ movement  Tolerating diet.  Reports no concerns w/ urination/defecation.   Family Communication: support person at bedside on rounds - he is concerned about her being more fatigued today     Objective Findings:  Vitals:   10/02/23 1927 10/03/23 0052 10/03/23 0328 10/03/23 0818  BP: (!) 143/88  (!) 160/111 (!) 157/88  Pulse: (!) 109  (!) 121 (!) 122  Resp: 20  17 18   Temp: 98.8 F (37.1 C)  99 F (37.2 C) (!) 100.6 F (38.1 C)  TempSrc: Oral   Oral  SpO2: 99%  96% 100%  Weight:  70.6 kg    Height:        Intake/Output Summary (Last 24 hours) at 10/03/2023 1547 Last data filed at 10/03/2023 0058 Gross per 24 hour  Intake 65 ml   Output 90 ml  Net -25 ml   Filed Weights   10/01/23 0502 10/02/23 0500 10/03/23 0052  Weight: 71.2 kg 70.2 kg 70.6 kg    Examination:  Physical Exam Constitutional:      General: She is not in acute distress. Cardiovascular:     Rate and Rhythm: Regular rhythm. Tachycardia present.     Heart sounds: Normal heart sounds.  Pulmonary:     Effort: Pulmonary effort is normal.     Breath sounds: Normal breath sounds.  Abdominal:     General: Abdomen is flat. Bowel sounds are normal.     Palpations: Abdomen is soft.     Tenderness: There is no abdominal tenderness.  Musculoskeletal:     Right lower leg: No edema.     Left lower leg: No edema.  Skin:    General: Skin is warm and dry.  Neurological:     General: No focal deficit present.     Mental Status: She is alert and oriented to person, place, and time.     Sensory: No sensory deficit.  Psychiatric:        Mood and Affect: Mood normal.        Behavior: Behavior normal.          Scheduled Medications:   aspirin EC  81 mg Oral Daily   Chlorhexidine Gluconate Cloth  6 each Topical Daily   clopidogrel  75 mg Oral Daily   DULoxetine  60 mg Oral Daily   enoxaparin (LOVENOX) injection  40 mg Subcutaneous Q24H   feeding supplement  237 mL Oral TID BM   furosemide  20 mg Oral Daily   gabapentin  600 mg Oral TID   heparin lock flush  500 Units Intracatheter Q30 days   hydrocortisone cream   Topical BID   metoprolol tartrate  50 mg Oral BID   multivitamin with minerals  1 tablet Oral Daily   oxyCODONE  60 mg Oral Q12H   pantoprazole  40 mg Oral BID   senna  1 tablet Oral Daily   sodium chloride flush  10-40 mL Intracatheter Q12H   sodium chloride flush  5 mL Intracatheter Q8H   sodium chloride  1 g Oral TID WC    Continuous Infusions:  ondansetron (ZOFRAN) IV Stopped (09/28/23 0900)    PRN Medications:  acetaminophen **OR** acetaminophen, albuterol, ALPRAZolam, heparin lock flush **AND** heparin lock flush,  iodixanol, magnesium hydroxide, ondansetron (ZOFRAN) IV, mouth rinse, [DISCONTINUED] oxyCODONE-acetaminophen **AND** oxyCODONE, prochlorperazine, sodium chloride flush, sodium chloride flush  Antimicrobials from admission:  Anti-infectives (From admission, onward)    Start     Dose/Rate Route Frequency Ordered Stop   09/27/23 1158  ceFAZolin (ANCEF) IVPB 2g/100 mL premix        2 g 200 mL/hr over 30 Minutes Intravenous 30 min pre-op 09/27/23 1158 09/27/23 1302   09/09/23 1515  cefOXitin (MEFOXIN) 2 g in sodium chloride 0.9 % 100 mL IVPB  Status:  Discontinued        2 g 200 mL/hr over 30 Minutes Intravenous On call 09/09/23 1507 09/10/23 0929   09/09/23 0600  piperacillin-tazobactam (ZOSYN) IVPB 3.375 g        3.375 g 12.5 mL/hr over 240 Minutes Intravenous Every 8 hours 09/09/23 0303 09/23/23 0513   09/08/23 2200  piperacillin-tazobactam (ZOSYN) IVPB 3.375 g  Status:  Discontinued        3.375 g 12.5 mL/hr over 240 Minutes Intravenous Every 8 hours 09/08/23 2128 09/08/23 2139   09/08/23 2200  piperacillin-tazobactam (ZOSYN) IVPB 3.375 g        3.375 g 12.5 mL/hr over 240 Minutes Intravenous  Once 09/08/23 2139 09/09/23 0232           Data Reviewed:  I have personally reviewed the following...  CBC: Recent Labs  Lab 09/27/23 0527 09/28/23 0310 09/30/23 0518 10/03/23 0314  WBC 9.9 9.7 10.2 12.6*  HGB 7.6* 7.8* 7.2* 7.3*  HCT 23.5* 24.0* 22.6* 23.1*  MCV 91.8 92.0 91.5 92.8  PLT 46* 45* 39* 28*   Basic Metabolic Panel: Recent Labs  Lab 09/27/23 0527 09/28/23 0310 09/30/23 0518 10/03/23 0314  NA 131* 131* 132* 130*  K 4.3 3.9 4.2 4.4  CL 97* 99 98 95*  CO2 24 24 24 24   GLUCOSE 103* 104* 100* 110*  BUN 13 13 12 19   CREATININE 0.48 0.43* 0.55 0.49  CALCIUM 8.2* 8.0* 8.2* 8.0*  MG 1.9 2.1 2.0 1.9  PHOS  --   --  3.4  --    GFR: Estimated Creatinine Clearance: 80.5 mL/min (by C-G formula  based on SCr of 0.49 mg/dL). Liver Function Tests: No results for  input(s): "AST", "ALT", "ALKPHOS", "BILITOT", "PROT", "ALBUMIN" in the last 168 hours. No results for input(s): "LIPASE", "AMYLASE" in the last 168 hours. No results for input(s): "AMMONIA" in the last 168 hours. Coagulation Profile: No results for input(s): "INR", "PROTIME" in the last 168 hours. Cardiac Enzymes: No results for input(s): "CKTOTAL", "CKMB", "CKMBINDEX", "TROPONINI" in the last 168 hours. BNP (last 3 results) No results for input(s): "PROBNP" in the last 8760 hours. HbA1C: No results for input(s): "HGBA1C" in the last 72 hours. CBG: Recent Labs  Lab 10/02/23 0249  GLUCAP 98   Lipid Profile: No results for input(s): "CHOL", "HDL", "LDLCALC", "TRIG", "CHOLHDL", "LDLDIRECT" in the last 72 hours. Thyroid Function Tests: No results for input(s): "TSH", "T4TOTAL", "FREET4", "T3FREE", "THYROIDAB" in the last 72 hours. Anemia Panel: No results for input(s): "VITAMINB12", "FOLATE", "FERRITIN", "TIBC", "IRON", "RETICCTPCT" in the last 72 hours. Most Recent Urinalysis On File:     Component Value Date/Time   COLORURINE YELLOW (A) 07/22/2023 0349   APPEARANCEUR HAZY (A) 07/22/2023 0349   LABSPEC >1.046 (H) 07/22/2023 0349   PHURINE 6.0 07/22/2023 0349   GLUCOSEU NEGATIVE 07/22/2023 0349   HGBUR NEGATIVE 07/22/2023 0349   BILIRUBINUR NEGATIVE 07/22/2023 0349   KETONESUR NEGATIVE 07/22/2023 0349   PROTEINUR NEGATIVE 07/22/2023 0349   NITRITE NEGATIVE 07/22/2023 0349   LEUKOCYTESUR NEGATIVE 07/22/2023 0349   Sepsis Labs: @LABRCNTIP (procalcitonin:4,lacticidven:4) Microbiology: Recent Results (from the past 240 hour(s))  Culture, blood (Routine X 2) w Reflex to ID Panel     Status: None   Collection Time: 09/24/23 10:51 AM   Specimen: BLOOD  Result Value Ref Range Status   Specimen Description BLOOD LEFT ANTECUBITAL  Final   Special Requests   Final    BOTTLES DRAWN AEROBIC AND ANAEROBIC Blood Culture adequate volume   Culture   Final    NO GROWTH 5 DAYS Performed at  [facility], [address], [place], [place] [address]    Report Status 09/29/2023 FINAL  Final  Culture, blood (Routine X 2) w Reflex to ID Panel     Status: None   Collection Time: 09/24/23 10:55 AM   Specimen: BLOOD  Result Value Ref Range Status   Specimen Description BLOOD RIGHT ANTECUBITAL  Final   Special Requests   Final    BOTTLES DRAWN AEROBIC AND ANAEROBIC Blood Culture adequate volume   Culture   Final    NO GROWTH 5 DAYS Performed at [facility], [address] [place], [place] [address]    Report Status 09/29/2023 FINAL  Final  SARS Coronavirus 2 by RT PCR (hospital order, performed in [facility] hospital lab) *cepheid single result test* Anterior Nasal Swab     Status: None   Collection Time: 09/24/23  8:30 PM   Specimen: Anterior Nasal Swab  Result Value Ref Range Status   SARS Coronavirus 2 by RT PCR NEGATIVE NEGATIVE Final    Comment: (NOTE) SARS-CoV-2 target nucleic acids are NOT DETECTED.  The SARS-CoV-2 RNA is generally detectable in upper and lower respiratory specimens during the acute phase of infection. The lowest concentration of SARS-CoV-2 viral copies this assay can detect is 250 copies / mL. A negative result does not preclude SARS-CoV-2 infection and should not be used as the sole basis for treatment or other patient management decisions.  A negative result may occur with improper specimen collection / handling, submission of specimen other than nasopharyngeal swab, presence of  viral mutation(s) within the areas targeted by this assay, and inadequate number of viral copies (<250 copies / mL). A negative result must be combined with clinical observations, patient history, and epidemiological information.  Fact Sheet for Patients:   RoadLapTop.co.za  Fact Sheet for Healthcare Providers: http://kim-miller.com/  This test is not yet approved or  cleared by the [place] FDA  and has been authorized for detection and/or diagnosis of SARS-CoV-2 by FDA under an Emergency Use Authorization (EUA).  This EUA will remain in effect (meaning this test can be used) for the duration of the COVID-19 declaration under Section 564(b)(1) of the Act, 21 U.S.C. section 360bbb-3(b)(1), unless the authorization is terminated or revoked sooner.  Performed at [facility], [address], [place], [place] [address]       Radiology Studies last 3 days: No results found.     Time spent: 50 min    Sunnie Nielsen, DO [facility] 10/03/2023, 3:47 PM    Dictation software may have been used to generate the above note. Typos may occur and escape review in typed/dictated notes. Please contact Dr Lyn Hollingshead directly for clarity if needed.  Staff may message me via secure chat in Epic  but this may not receive an immediate response,  please page me for urgent matters!  If 7PM-7AM, please contact night coverage [email or web address]

## 2023-10-04 ENCOUNTER — Inpatient Hospital Stay: Payer: 59

## 2023-10-04 DIAGNOSIS — Z515 Encounter for palliative care: Secondary | ICD-10-CM | POA: Diagnosis not present

## 2023-10-04 DIAGNOSIS — C799 Secondary malignant neoplasm of unspecified site: Secondary | ICD-10-CM | POA: Diagnosis not present

## 2023-10-04 DIAGNOSIS — K810 Acute cholecystitis: Secondary | ICD-10-CM | POA: Diagnosis not present

## 2023-10-04 LAB — CBC WITH DIFFERENTIAL/PLATELET
Abs Immature Granulocytes: 0.26 10*3/uL — ABNORMAL HIGH (ref 0.00–0.07)
Basophils Absolute: 0 10*3/uL (ref 0.0–0.1)
Basophils Relative: 0 %
Eosinophils Absolute: 0 10*3/uL (ref 0.0–0.5)
Eosinophils Relative: 0 %
HCT: 22.4 % — ABNORMAL LOW (ref 36.0–46.0)
Hemoglobin: 7.3 g/dL — ABNORMAL LOW (ref 12.0–15.0)
Immature Granulocytes: 5 %
Lymphocytes Relative: 7 %
Lymphs Abs: 0.4 10*3/uL — ABNORMAL LOW (ref 0.7–4.0)
MCH: 29.3 pg (ref 26.0–34.0)
MCHC: 32.6 g/dL (ref 30.0–36.0)
MCV: 90 fL (ref 80.0–100.0)
Monocytes Absolute: 0.1 10*3/uL (ref 0.1–1.0)
Monocytes Relative: 2 %
Neutro Abs: 5 10*3/uL (ref 1.7–7.7)
Neutrophils Relative %: 86 %
Platelets: 23 10*3/uL — CL (ref 150–400)
RBC: 2.49 MIL/uL — ABNORMAL LOW (ref 3.87–5.11)
RDW: 18.5 % — ABNORMAL HIGH (ref 11.5–15.5)
Smear Review: NORMAL
WBC: 5.7 10*3/uL (ref 4.0–10.5)
nRBC: 13.9 % — ABNORMAL HIGH (ref 0.0–0.2)

## 2023-10-04 LAB — BASIC METABOLIC PANEL
Anion gap: 10 (ref 5–15)
BUN: 22 mg/dL — ABNORMAL HIGH (ref 6–20)
CO2: 24 mmol/L (ref 22–32)
Calcium: 8.2 mg/dL — ABNORMAL LOW (ref 8.9–10.3)
Chloride: 96 mmol/L — ABNORMAL LOW (ref 98–111)
Creatinine, Ser: 0.63 mg/dL (ref 0.44–1.00)
GFR, Estimated: >60 mL/min (ref >60)
Glucose, Bld: 99 mg/dL (ref 70–99)
Potassium: 4.5 mmol/L (ref 3.5–5.1)
Sodium: 130 mmol/L — ABNORMAL LOW (ref 135–145)

## 2023-10-04 LAB — BODY FLUID CELL COUNT WITH DIFFERENTIAL
Eos, Fluid: 0 %
Lymphs, Fluid: 25 %
Monocyte-Macrophage-Serous Fluid: 68 % (ref 50–90)
Neutrophil Count, Fluid: 7 % (ref 0–25)
Total Nucleated Cell Count, Fluid: 1815 uL — ABNORMAL HIGH (ref 0–1000)

## 2023-10-04 LAB — LACTATE DEHYDROGENASE, PLEURAL OR PERITONEAL FLUID: LD, Fluid: 376 U/L — ABNORMAL HIGH (ref 3–23)

## 2023-10-04 LAB — GLUCOSE, PLEURAL OR PERITONEAL FLUID: Glucose, Fluid: 76 mg/dL

## 2023-10-04 LAB — PROTEIN, PLEURAL OR PERITONEAL FLUID: Total protein, fluid: 3.8 g/dL

## 2023-10-04 MED ORDER — SODIUM CHLORIDE 0.9 % IV BOLUS
1000.0000 mL | Freq: Once | INTRAVENOUS | Status: AC
  Administered 2023-10-04: 1000 mL via INTRAVENOUS

## 2023-10-04 MED ORDER — PIPERACILLIN-TAZOBACTAM 3.375 G IVPB 30 MIN: 3.3750 g | Freq: Once | INTRAVENOUS | Status: DC

## 2023-10-04 MED ORDER — OXYCODONE HCL ER 15 MG PO T12A
40.0000 mg | EXTENDED_RELEASE_TABLET | Freq: Three times a day (TID) | ORAL | Status: DC
  Filled 2023-10-04 (×2): qty 1

## 2023-10-04 MED ORDER — HYDROMORPHONE HCL 1 MG/ML IJ SOLN
0.5000 mg | INTRAMUSCULAR | Status: DC | PRN
  Administered 2023-10-04: 0.5 mg via INTRAVENOUS
  Filled 2023-10-04: qty 0.5

## 2023-10-04 MED ORDER — PIPERACILLIN-TAZOBACTAM 3.375 G IVPB
3.3750 g | Freq: Three times a day (TID) | INTRAVENOUS | Status: DC
  Administered 2023-10-04 – 2023-10-05 (×4): 3.375 g via INTRAVENOUS
  Filled 2023-10-04 (×4): qty 50

## 2023-10-04 MED ORDER — LIDOCAINE HCL (PF) 1 % IJ SOLN
10.0000 mL | Freq: Once | INTRAMUSCULAR | Status: AC
  Administered 2023-10-04: 10 mL via SUBCUTANEOUS
  Filled 2023-10-04: qty 10

## 2023-10-04 MED ORDER — HYDROMORPHONE HCL 1 MG/ML IJ SOLN
1.0000 mg | INTRAMUSCULAR | Status: DC | PRN
  Administered 2023-10-04 – 2023-10-05 (×6): 1 mg via INTRAVENOUS
  Filled 2023-10-04 (×7): qty 1

## 2023-10-04 NOTE — Progress Notes (Signed)
Pharmacy Antibiotic Note  Sheryl Silva is a 47 y.o. female admitted on 09/08/2023 with pneumonia.  Pharmacy has been consulted for Zosyn dosing.  Plan: Zosyn 3.375g IV q8h (4 hour infusion).  Height: 5\' 6"  (167.6 cm) Weight: 70.6 kg (155 lb 10.3 oz) IBW/kg (Calculated) : 59.3  Temp (24hrs), Avg:100.6 F (38.1 C), Min:100.6 F (38.1 C), Max:100.6 F (38.1 C)  Recent Labs  Lab 09/28/23 0310 09/30/23 0518 10/03/23 0314  WBC 9.7 10.2 12.6*  CREATININE 0.43* 0.55 0.49    Estimated Creatinine Clearance: 80.5 mL/min (by C-G formula based on SCr of 0.49 mg/dL).    Allergies  Allergen Reactions   Nifedipine Palpitations    Antimicrobials this admission:   >>    >>   Dose adjustments this admission:   Microbiology results:  BCx:   UCx:    Sputum:    MRSA PCR:   Thank you for allowing pharmacy to be a part of this patient's care.  Jearlean Demauro D 10/04/2023 5:55 AM

## 2023-10-04 NOTE — Progress Notes (Signed)
Palliative Medicine [facility] at [facility] [phone] [phone]   Name: Sheryl Silva Date: 10/04/2023 MRN: [id number]  DOB: Sep 05, 1976  Patient Care Team: [name], MD as PCP - General (Internal Medicine) Benita Gutter, RN as Oncology Nurse Navigator Jeralyn Ruths, MD as Consulting Physician (Oncology) Johnnette Barrios, RN as VBCI Care Management    REASON FOR CONSULTATION: Sheryl Silva is a 47 y.o. 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  Patient recently hospitalized 07/22/2023 to 08/04/2023 with CMV colitis treated with valganciclovir.  She was hospitalized again 08/20/2023 to 08/23/2023 with cholecystitis, which was treated conservatively.  Patient was readmitted 09/08/2023 with abdominal pain and again found to have acute cholecystitis.  Patient underwent cholecystostomy drain placed by IR on 11/8.  She has subsequently had ongoing abdominal pain.  Palliative care was consulted to address goals and manage ongoing symptoms.   CODE STATUS: DNR  PAST MEDICAL HISTORY: Past Medical History:  Diagnosis Date   Anxiety    Cancer (HCC)    Depression    Heart murmur    Hypertension     PAST SURGICAL HISTORY:  Past Surgical History:  Procedure Laterality Date   BIOPSY  07/26/2023   Procedure: BIOPSY;  Surgeon: Toney Reil, MD;  Location: [facility] ENDOSCOPY;  Service: Gastroenterology;;   CESAREAN SECTION     COLONOSCOPY WITH PROPOFOL N/A 02/21/2023   Procedure: COLONOSCOPY WITH PROPOFOL;  Surgeon: Regis Bill, MD;  Location: [facility] ENDOSCOPY;  Service: Endoscopy;  Laterality: N/A;   DILATION AND CURETTAGE OF UTERUS     FLEXIBLE SIGMOIDOSCOPY N/A 07/26/2023   Procedure: FLEXIBLE SIGMOIDOSCOPY;  Surgeon: Toney Reil, MD;  Location: [facility] ENDOSCOPY;  Service: Gastroenterology;  Laterality: N/A;   IR IMAGING GUIDED PORT INSERTION   02/16/2023   IR PERC CHOLECYSTOSTOMY  09/09/2023   LOWER EXTREMITY ANGIOGRAPHY Left 09/27/2023   Procedure: Lower Extremity Angiography;  Surgeon: Renford Dills, MD;  Location: [facility] INVASIVE CV LAB;  Service: Cardiovascular;  Laterality: Left;    HEMATOLOGY/ONCOLOGY HISTORY:  Oncology History  Signet ring cell adenocarcinoma (HCC)  02/07/2023 Cancer Staging   Staging form: Exocrine Pancreas, AJCC 8th Edition - Clinical stage from 02/07/2023: Stage IV (cTX, cNX, pM1) - Signed by Jeralyn Ruths, MD on 02/14/2023 Stage prefix: Initial diagnosis   02/14/2023 Initial Diagnosis   Signet ring cell adenocarcinoma   03/02/2023 -  Chemotherapy   Patient is on Treatment Plan : GI ORIGIN FOLFOX+KEYTRUDA q21d x12 followed by Gwenlyn Fudge       ALLERGIES:  is allergic to nifedipine.  MEDICATIONS:  Current Facility-Administered Medications  Medication Dose Route Frequency Provider Last Rate Last Admin   acetaminophen (TYLENOL) tablet 650 mg  650 mg Oral Q6H PRN Schnier, Latina Craver, MD   650 mg at 09/23/23 1037   Or   acetaminophen (TYLENOL) suppository 650 mg  650 mg Rectal Q6H PRN Schnier, Latina Craver, MD       albuterol (PROVENTIL) (2.5 MG/3ML) 0.083% nebulizer solution 2.5 mg  2.5 mg Inhalation Q6H PRN Schnier, Latina Craver, MD       ALPRAZolam Prudy Feeler) tablet 0.25 mg  0.25 mg Oral TID PRN Gilda Crease Latina Craver, MD   0.25 mg at 10/04/23 0551   aspirin EC tablet 81 mg  81 mg Oral Daily Pace, Brien R, NP   81 mg at 10/04/23 0900   Chlorhexidine Gluconate Cloth 2 % PADS 6 each  6 each  Topical Daily Schnier, Latina Craver, MD   6 each at 10/04/23 [phone]   clopidogrel (PLAVIX) tablet 75 mg  75 mg Oral Daily Rolla Plate R, NP   75 mg at 10/04/23 4742   DULoxetine (CYMBALTA) DR capsule 60 mg  60 mg Oral Daily Schnier, Latina Craver, MD   60 mg at 10/04/23 0902   enoxaparin (LOVENOX) injection 40 mg  40 mg Subcutaneous Q24H Schnier, Latina Craver, MD   40 mg at 10/04/23 5956   feeding supplement (ENSURE ENLIVE /  ENSURE PLUS) liquid 237 mL  237 mL Oral TID BM Schnier, Latina Craver, MD   237 mL at 10/03/23 1312   furosemide (LASIX) tablet 20 mg  20 mg Oral Daily Schnier, Latina Craver, MD   20 mg at 10/04/23 3875   gabapentin (NEURONTIN) capsule 600 mg  600 mg Oral TID Renford Dills, MD   600 mg at 10/04/23 0859   heparin lock flush 100 unit/mL  500 Units Intracatheter Q30 days Sunnie Nielsen, DO       And   heparin lock flush 100 unit/mL  500 Units Intracatheter PRN Sunnie Nielsen, DO   500 Units at 10/03/23 1037   hydrocortisone cream 1 %   Topical BID Schnier, Latina Craver, MD   Given at 10/03/23 1030   iodixanol (VISIPAQUE) 320 MG/ML injection 5 mL  5 mL Other Once PRN Schnier, Latina Craver, MD       magnesium hydroxide (MILK OF MAGNESIA) suspension 30 mL  30 mL Oral Daily PRN Schnier, Latina Craver, MD   30 mL at 09/13/23 2112   metoprolol tartrate (LOPRESSOR) tablet 50 mg  50 mg Oral BID Darlin Priestly, MD   50 mg at 10/04/23 0859   multivitamin with minerals tablet 1 tablet  1 tablet Oral Daily Schnier, Latina Craver, MD   1 tablet at 10/03/23 0853   ondansetron (ZOFRAN) 8 mg in sodium chloride 0.9 % 50 mL IVPB  8 mg Intravenous Q8H PRN Schnier, Latina Craver, MD   Stopped at 09/28/23 0900   Oral care mouth rinse  15 mL Mouth Rinse PRN Schnier, Latina Craver, MD       oxyCODONE (Oxy IR/ROXICODONE) immediate release tablet 15 mg  15 mg Oral Q4H PRN Schnier, Latina Craver, MD   15 mg at 10/04/23 6433   oxyCODONE (OXYCONTIN) 12 hr tablet 40 mg  40 mg Oral Q8H Dodger Sinning, Daryl Eastern, NP       pantoprazole (PROTONIX) EC tablet 40 mg  40 mg Oral BID Renford Dills, MD   40 mg at 10/04/23 0900   piperacillin-tazobactam (ZOSYN) IVPB 3.375 g  3.375 g Intravenous Q8H Sunnie Nielsen, DO 12.5 mL/hr at 10/04/23 0629 3.375 g at 10/04/23 2951   prochlorperazine (COMPAZINE) injection 5 mg  5 mg Intravenous Q6H PRN Schnier, Latina Craver, MD   5 mg at 09/28/23 1026   senna (SENOKOT) tablet 8.6 mg  1 tablet Oral Daily Schnier, Latina Craver, MD    8.6 mg at 10/03/23 0853   sodium chloride flush (NS) 0.9 % injection 10-40 mL  10-40 mL Intracatheter Q12H Sunnie Nielsen, DO   10 mL at 10/03/23 2232   sodium chloride flush (NS) 0.9 % injection 10-40 mL  10-40 mL Intracatheter PRN Sunnie Nielsen, DO       sodium chloride tablet 1 g  1 g Oral TID WC Schnier, Latina Craver, MD   1 g at 10/04/23 0901   Facility-Administered Medications Ordered in Other Encounters  Medication  Dose Route Frequency Provider Last Rate Last Admin   heparin lock flush 100 unit/mL  500 Units Intravenous Once Remona Boom, Daryl Eastern, NP       sodium chloride flush (NS) 0.9 % injection 10 mL  10 mL Intracatheter PRN Jeralyn Ruths, MD   10 mL at 06/29/23 1430    VITAL SIGNS: BP (!) 157/96 (BP Location: Left Arm)   Pulse (!) 116   Temp 98.3 F (36.8 C) (Oral)   Resp 18   Ht 5\' 6"  (1.676 m)   Wt 155 lb 10.3 oz (70.6 kg)   SpO2 94%   BMI 25.12 kg/m  Filed Weights   10/01/23 0502 10/02/23 0500 10/03/23 0052  Weight: 156 lb 15.5 oz (71.2 kg) 154 lb 12.2 oz (70.2 kg) 155 lb 10.3 oz (70.6 kg)    Estimated body mass index is 25.12 kg/m as calculated from the following:   Height as of this encounter: 5\' 6"  (1.676 m).   Weight as of this encounter: 155 lb 10.3 oz (70.6 kg).  LABS: CBC:    Component Value Date/Time   WBC 12.6 (H) 10/03/2023 0314   HGB 7.3 (L) 10/03/2023 0314   HGB 8.1 (L) 09/08/2023 1332   HCT 23.1 (L) 10/03/2023 0314   PLT 28 (LL) 10/03/2023 0314   PLT 166 09/08/2023 1332   MCV 92.8 10/03/2023 0314   NEUTROABS 3.6 09/08/2023 1332   LYMPHSABS 0.7 09/08/2023 1332   MONOABS 0.3 09/08/2023 1332   EOSABS 0.0 09/08/2023 1332   BASOSABS 0.0 09/08/2023 1332   Comprehensive Metabolic Panel:    Component Value Date/Time   NA 130 (L) 10/03/2023 0314   K 4.4 10/03/2023 0314   CL 95 (L) 10/03/2023 0314   CO2 24 10/03/2023 0314   BUN 19 10/03/2023 0314   CREATININE 0.49 10/03/2023 0314   CREATININE 0.50 09/08/2023 1332   GLUCOSE 110 (H)  10/03/2023 0314   CALCIUM 8.0 (L) 10/03/2023 0314   AST 30 09/14/2023 0452   AST 683 (HH) 09/08/2023 1332   ALT 34 09/14/2023 0452   ALT 324 (HH) 09/08/2023 1332   ALKPHOS 516 (H) 09/14/2023 0452   BILITOT 0.9 09/14/2023 0452   BILITOT 1.3 (H) 09/08/2023 1332   PROT 6.5 09/14/2023 0452   ALBUMIN 2.4 (L) 09/14/2023 0452    RADIOGRAPHIC STUDIES: CT Angio Chest Pulmonary Embolism (PE) W or WO Contrast  Result Date: 10/03/2023 CLINICAL DATA:  Pulmonary embolus suspected with high probability. Shortness of breath and tachycardia. Cancer patient. EXAM: CT ANGIOGRAPHY CHEST WITH CONTRAST TECHNIQUE: Multidetector CT imaging of the chest was performed using the standard protocol during bolus administration of intravenous contrast. Multiplanar CT image reconstructions and MIPs were obtained to evaluate the vascular anatomy. RADIATION DOSE REDUCTION: This exam was performed according to the departmental dose-optimization program which includes automated exposure control, adjustment of the mA and/or kV according to patient size and/or use of iterative reconstruction technique. CONTRAST:  75mL OMNIPAQUE IOHEXOL 350 MG/ML SOLN COMPARISON:  Chest radiograph 10/03/2023.  CT chest 08/20/2023 FINDINGS: Cardiovascular: Technically adequate study with good opacification of the central and segmental pulmonary arteries. No focal filling defects. No evidence of significant pulmonary embolus. Normal heart size. Small pericardial effusion. Normal caliber thoracic aorta. No aortic dissection. Great vessel origins are patent. Right central venous catheter with tip in the right atrium. Mediastinum/Nodes: Esophagus is decompressed. Thyroid gland is unremarkable. Prominent lymph nodes are demonstrated in the axilla bilaterally and extending into the supraclavicular regions. Largest left axillary lymph nodes measure  2.3 cm short axis dimension. Scattered mediastinal lymph nodes are also mildly enlarged. These are likely  metastatic. Similar appearance to previous study. Lungs/Pleura: Large left pleural effusion. Atelectasis or consolidation in both lung bases, greater on the left. Patchy airspace disease demonstrated throughout the remainder of both lungs. This could represent multifocal pneumonia or aspiration. The parenchymal process is progressing since the prior study. Upper Abdomen: Moderate upper abdominal ascites. Poorly defined hypodense lesion demonstrated in the lateral segment left lobe of liver measuring 2 cm diameter. This is incompletely included but may represent a metastatic focus. Musculoskeletal: Diffuse heterogeneous sclerosis throughout the visualized skeleton consistent with diffuse bone metastasis. Review of the MIP images confirms the above findings. IMPRESSION: 1. No evidence of significant pulmonary embolus. 2. Large left pleural effusion. Basilar atelectasis or consolidation in both lower lungs. 3. Progressing patchy airspace disease throughout both lungs likely representing multifocal pneumonia or possibly aspiration. 4. Metastatic lymphadenopathy in the chest is similar to prior study. 5. Focal liver lesion is possibly metastatic. Incompletely included within this study. 6. Moderate upper abdominal ascites.  Small pericardial effusion. 7. Diffuse skeletal metastasis. Electronically Signed   By: Burman Nieves M.D.   On: 10/03/2023 22:40   DG Chest Port 1 View  Result Date: 10/03/2023 CLINICAL DATA:  Fever EXAM: PORTABLE CHEST 1 VIEW COMPARISON:  09/18/2023 FINDINGS: Right-sided central venous port tip at the right atrium. Low lung volumes. Increasing consolidation at left base. Small left-sided pleural effusion. Stable cardiomediastinal silhouette. Diffuse sclerosis corresponding to history of metastatic disease. IMPRESSION: Increasing consolidation at left base, atelectasis versus pneumonia with interim small left effusion. Electronically Signed   By: Jasmine Pang M.D.   On: 10/03/2023 21:12    PERIPHERAL VASCULAR CATHETERIZATION  Result Date: 09/27/2023 See surgical note for result.  US ARTERIAL ABI (SCREENING LOWER EXTREMITY)  Result Date: 09/24/2023 CLINICAL DATA:  47 year old female with left toe pain. EXAM: NONINVASIVE PHYSIOLOGIC VASCULAR STUDY OF BILATERAL LOWER EXTREMITIES TECHNIQUE: Evaluation of both lower extremities were performed at rest, including calculation of ankle-brachial indices with single level Doppler, pressure and pulse volume recording. COMPARISON:  None Available. FINDINGS: Right ABI:  0.97 Left ABI:  0.60 Right Lower Extremity:  Normal arterial waveforms at the ankle. Left Lower Extremity:  Monophasic arterial waveforms at the ankle. IMPRESSION: 1. Left ankle-brachial index of 0.60, consistent with moderate arterial occlusive disease. 2. Normal right ankle-brachial index. Marliss Coots, MD [facility] Electronically Signed   By: Marliss Coots M.D.   On: 09/24/2023 07:23   DG Chest Port 1 View  Result Date: 09/18/2023 CLINICAL DATA:  Metastatic adenocarcinoma, dyspnea. EXAM: PORTABLE CHEST 1 VIEW COMPARISON:  08/20/2023. FINDINGS: Unremarkable cardiac silhouette. Right base consolidation or volume loss. Right-sided Port-A-Cath tip distal SVC. No pneumothorax. There may be small right-sided pleural effusion. Diffuse osteoblastic metastatic disease. IMPRESSION: Right base consolidation or volume loss. Possible small right-sided diffuse osteoblastic metastatic disease. Electronically Signed   By: Layla Maw M.D.   On: 09/18/2023 10:32   US PELVIC TRANSABD W/PELVIC DOPPLER  Result Date: 09/11/2023 CLINICAL DATA:  Left groin pain. History of stage for metastatic adenocarcinoma of the GI tract. EXAM: TRANSABDOMINAL AND TRANSVAGINAL ULTRASOUND OF PELVIS DOPPLER ULTRASOUND OF OVARIES TECHNIQUE: Both transabdominal and transvaginal ultrasound examinations of the pelvis were performed. Transabdominal  technique was performed for global imaging of the pelvis including uterus, ovaries, adnexal regions, and pelvic cul-de-sac. It was necessary to proceed with endovaginal exam following the transabdominal exam to visualize the uterus and  ovaries. Color and duplex Doppler ultrasound was utilized to evaluate blood flow to the ovaries. COMPARISON:  CT AP 09/08/2023 FINDINGS: Uterus Measurements: 8.2 x 4.0 x 5.6 cm = volume: 96 mL. No fibroids or other mass visualized. Endometrium Thickness: 4.1 mm.  No focal abnormality visualized. Right ovary Not visualized. Left ovary Left adnexal mass and left ovary are indistinguishable measuring 5.3 x 5.9 x 5.5 cm. Pulsed Doppler evaluation of both ovaries demonstrates There is decreased color Doppler flow to the left adnexal mass. No arterial waveforms identified within left adnexal mass/left ovary. Normal venous waveforms were noted. Other findings Small volume of free fluid noted within the pelvis. IMPRESSION: 1. Left adnexal mass and left ovary are indistinguishable measuring 5.3 x 5.9 x 5.5 cm. There is decreased color Doppler flow to the left adnexal mass. No arterial waveforms identified within left adnexal mass/left ovary. Normal venous waveforms were noted. Imaging findings are indeterminate but torsion cannot be excluded. 2. Small volume of free fluid noted within the pelvis. 3. Right ovary not visualized. Electronically Signed   By: Signa Kell M.D.   On: 09/11/2023 16:21   IR Perc Cholecystostomy  Result Date: 09/10/2023 INDICATION: Acute cholecystitis EXAM: Placement of percutaneous cholecystostomy tube using ultrasound and fluoroscopic guidance MEDICATIONS: Documented in the EMR ANESTHESIA/SEDATION: Moderate (conscious) sedation was employed during this procedure. A total of Versed 2 mg and Fentanyl 100 mcg was administered intravenously by the radiology nurse. Total intra-service moderate Sedation Time: 18 minutes. The patient's level of consciousness and vital  signs were monitored continuously by radiology nursing throughout the procedure under my direct supervision. FLUOROSCOPY: Radiation Exposure Index (as provided by the fluoroscopic device): 1.5 minutes (8 mGy) COMPLICATIONS: None immediate. PROCEDURE: Informed written consent was obtained from the patient after a thorough discussion of the procedural risks, benefits and alternatives. All questions were addressed. Maximal Sterile Barrier Technique was utilized including caps, mask, sterile gowns, sterile gloves, sterile drape, hand hygiene and skin antiseptic. A timeout was performed prior to the initiation of the procedure. The patient was placed supine on the exam table. The right upper quadrant was prepped and draped in the standard sterile fashion. Ultrasound of the right upper quadrant was performed for planning purposes. This again demonstrated a distended gallbladder with wall edema and sludge, consistent with acute cholecystitis. An infracostal transhepatic approach was planned. Skin entry site was marked, and local analgesia was obtained with 1% lidocaine. Under ultrasound guidance, percutaneous access was obtained into the gallbladder via an infracostal transhepatic approach using a 21 gauge Chiba needle. Access was confirmed with visualization of needle tip within the gallbladder lumen, and free return of bile. An 018 Nitrex wire was then advanced through the access needle and coiled within the gallbladder lumen. A transition dilator was advanced over this wire, through which an antegrade cholecystogram was performed. Antegrade cholecystogram demonstrated appropriate location in the gallbladder lumen and a distended gallbladder with gallstones. The cystic duct was not visualized. Over an Amplatz wire, the percutaneous tract was serially dilated followed by placement of a 10 French locking multipurpose drainage catheter into the gallbladder lumen. Locking loop was formed. Additional biliary sludge was  drained. Gentle hand injection of contrast material confirmed location of the gallbladder lumen. The drainage catheter was secured to the skin using silk suture and a dressing. It was placed to bag drainage. The patient tolerated the procedure well without immediate complication. IMPRESSION: Successful placement of a 10 French percutaneous cholecystostomy tube. Cholecystostomy tube placed to bag drainage. Additional follow-up recommendations  per referring surgical service. Electronically Signed   By: Olive Bass M.D.   On: 09/10/2023 11:02   CT ABDOMEN PELVIS W CONTRAST  Result Date: 09/08/2023 CLINICAL DATA:  Elevated liver enzymes, nausea and diarrhea for 3 days, cholelithiasis with acute cholecystitis on previous imaging, history of metastatic adrenal carcinoma EXAM: CT ABDOMEN AND PELVIS WITH CONTRAST TECHNIQUE: Multidetector CT imaging of the abdomen and pelvis was performed using the standard protocol following bolus administration of intravenous contrast. RADIATION DOSE REDUCTION: This exam was performed according to the departmental dose-optimization program which includes automated exposure control, adjustment of the mA and/or kV according to patient size and/or use of iterative reconstruction technique. CONTRAST:  OMNIPAQUE IOHEXOL 300 MG/ML  SOLN COMPARISON:  08/21/2023, 08/20/2023 FINDINGS: Lower chest: Stable bibasilar hypoventilatory changes and scarring. No acute pleural or parenchymal lung disease. Hepatobiliary: There is a new 1 cm hypodensity within the left lobe liver, corresponding to hypoechoic lesion seen on today's ultrasound. Given history of metastatic adrenal carcinoma, new metastatic lesion is suspected. Multiple gallstones are again identified, with progressive gallbladder wall thickening and intramural edema consistent with acute cholecystitis. No evidence of choledocholithiasis. Pancreas: Unremarkable. No pancreatic ductal dilatation or surrounding inflammatory changes.  Spleen: Normal in size without focal abnormality. Adrenals/Urinary Tract: The adrenals are unremarkable. The kidneys enhance normally. There is new mild left-sided hydronephrosis, with mucosal enhancement at the left UPJ suggesting obstructing mucosal lesion, reference image 43/2. No right-sided obstruction. No urinary tract calculi. The bladder is unremarkable. Stomach/Bowel: No bowel obstruction or ileus. Normal appendix right lower quadrant. No bowel wall thickening or inflammatory change. Vascular/Lymphatic: Stable atherosclerosis of the aorta. There is persistent retroperitoneal lymphadenopathy consistent with metastatic disease. Index left para-aortic lymph node image 44/2 measures 8 mm in short axis, stable. No new adenopathy. Reproductive: 5.1 x 3.9 cm left adnexal mass not appreciably changed since prior exam. Right ovary is unremarkable and stable. Stable uterine fibroid. Other: Trace pelvic free fluid unchanged. No free intraperitoneal gas. No abdominal wall hernia. Musculoskeletal: Diffuse sclerosis throughout the visualized bony structures compatible with bony metastases. No acute or pathologic fracture. Reconstructed images demonstrate no additional findings. IMPRESSION: 1. Cholelithiasis with progressive gallbladder wall thickening and intramural edema consistent with worsening acute cholecystitis. 2. New 1 cm hypodensity within the left lobe liver, concerning for new liver metastasis. This was not evident on recent CT or MRI. 3. Persistent metastatic disease, with diffuse sclerotic bony metastases and retroperitoneal adenopathy unchanged. 4. Stable indeterminate left adnexal mass. 5. New mild left hydronephrosis, with subtle mucosal enhancement within the left ureter at the UPJ. Mucosal lesion cannot be excluded, and urologic consultation may be useful. 6.  Aortic Atherosclerosis (ICD10-I70.0). Electronically Signed   By: Sharlet Salina M.D.   On: 09/08/2023 21:43   US Abdomen Limited RUQ  (LIVER/GB)  Result Date: 09/08/2023 CLINICAL DATA:  Quadrant pain for 3 days EXAM: ULTRASOUND ABDOMEN LIMITED RIGHT UPPER QUADRANT COMPARISON:  08/20/2023 FINDINGS: Gallbladder: Multiple shadowing gallstones are seen filling the gallbladder lumen. Gallbladder wall remains thickened measuring up to 8 mm, with prominent intramural edema now noted. Negative sonographic Murphy sign. Common bile duct: Diameter: 11 mm Liver: Normal liver echotexture. There is a circumscribed 1.3 cm hypoechoic area within the left lobe liver, without associated abnormality on prior CT or MRI. Portal vein is patent on color Doppler imaging with normal direction of blood flow towards the liver. Other: None. IMPRESSION: 1. Cholelithiasis, with worsening gallbladder wall thickening compatible with progressive acute cholecystitis. 2. Stable dilated common bile duct.  3. Indeterminate 1.3 cm hypoechoic area left lobe liver, not clearly seen on previous CT or MRI. Electronically Signed   By: Sharlet Salina M.D.   On: 09/08/2023 20:59    PERFORMANCE STATUS (ECOG) : 2 - Symptomatic, <50% confined to bed  Review of Systems Unless otherwise noted, a complete review of systems is negative.  Physical Exam General: NAD Cardiovascular: regular rate and rhythm Pulmonary: clear ant fields Abdomen: soft, nontender, + bowel sounds GU: no suprapubic tenderness Extremities: no edema, no joint deformities Skin: no rashes Neurological: Weakness but otherwise nonfocal  IMPRESSION: Follow-up visit.  Prolonged hospitalization has been complicated by poorly controlled pain, hyponatremia, ischemic toe requiring angioplasty and iliac stent placement, and now with multifocal pneumonia/pleural effusion.  Patient hypoxic this morning.  On O2.  On O2.  CTA of the chest showed multifocal pneumonia and large left-sided pleural effusion.  Patient is pending thoracentesis.  She continues to endorse poorly controlled generalized pain.  Patient was  rotated from fentanyl to OxyContin last week, which initially improved her pain.  However, patient now says that the OxyContin does not last a full 12 hours.  Will rotate from OxyContin 60 mg every 12 hours to 40 mg every 8 hours.  If ineffective, would consider 60 mg every 8 hours.  Continue Oxy IR as needed for breakthrough pain.  I had a long conversation with patient, significant other, and daughter (on the phone).  Patient has had a prolonged hospitalization without significant improvement.  She has been off chemotherapy for several months and almost certainly has had disease progression over that time.  Anemia and thrombocytopenia likely reflect marrow infiltration from the cancer.  Patient would not be considered a treatment candidate at this point.  Hospice would be appropriate.  Patient and family would like to continue current scope of treatment with the goal being to transport her to [place] to be with family.  They understand that patient could further decompensate during this hospitalization.  We discussed CODE STATUS at length.  Patient stated clearly and repeatedly that she would not want to be resuscitated nor have her life prolonged artificially on machines.  She was in agreement with DNR/DNI.  Family also agreed with this decision.  Patient does not have advanced directives but would want her daughter Marya Amsler) to be her decision-maker if needed.  PLAN: -Continue current scope of treatment -Change OxyContin 40 mg every 8 hours -Continue oxycodone IR as needed for breakthrough pain -Daily bowel regimen -DNR/DNI  Case and plan discussed with Drs. Orlie Dakin and Alexander  Time Total: 45 minutes  Visit consisted of counseling and education dealing with the complex and emotionally intense issues of symptom management and palliative care in the setting of serious and potentially life-threatening illness.Greater than 50%  of this time was spent counseling and coordinating care related to  the above assessment and plan.  Signed by: Laurette Schimke, PhD, NP-C

## 2023-10-04 NOTE — Progress Notes (Signed)
PT Cancellation Note  Patient Details Name: Sheryl Silva MRN: [id number] DOB: Jun 25, 1976   Cancelled Treatment:    Reason Eval/Treat Not Completed: Medical issues which prohibited therapy  Upon entering pt room, pt reported feeling SOB. Noted pt nasal cannula not correctly placed in nose, SPO2 80%.  Adjusted pt's cannula and increased O2 to 4 L O2, after > 2 min SPO2 increased to 90%.  NP came into room and this author reported SPO2 and increasing supplemental O2 to 4 L.  NP recommends keeping pt on 4 LO2 and holding Therapy for today due to pt's hypoxia.   Hortencia Conradi, PTA  10/04/23, 9:39 AM

## 2023-10-04 NOTE — Plan of Care (Signed)
Patient alert and oriented X 4, significant other stayed overnight. Patient received PRN and scheduled pain medication throughout night. Sent UA and Culture during night, started zosynn IV AM. Problem: Education: Goal: Knowledge of General Education information will improve Description: Including pain rating scale, medication(s)/side effects and non-pharmacologic comfort measures Outcome: Not Progressing   Problem: Health Behavior/Discharge Planning: Goal: Ability to manage health-related needs will improve Outcome: Not Progressing   Problem: Clinical Measurements: Goal: Ability to maintain clinical measurements within normal limits will improve Outcome: Not Progressing Goal: Will remain free from infection Outcome: Not Progressing Goal: Diagnostic test results will improve Outcome: Not Progressing Goal: Respiratory complications will improve Outcome: Not Progressing Goal: Cardiovascular complication will be avoided Outcome: Not Progressing   Problem: Activity: Goal: Risk for activity intolerance will decrease Outcome: Not Progressing   Problem: Nutrition: Goal: Adequate nutrition will be maintained Outcome: Not Progressing   Problem: Coping: Goal: Level of anxiety will decrease Outcome: Not Progressing   Problem: Elimination: Goal: Will not experience complications related to bowel motility Outcome: Not Progressing Goal: Will not experience complications related to urinary retention Outcome: Not Progressing   Problem: Pain Management: Goal: General experience of comfort will improve Outcome: Not Progressing   Problem: Safety: Goal: Ability to remain free from injury will improve Outcome: Not Progressing   Problem: Skin Integrity: Goal: Risk for impaired skin integrity will decrease Outcome: Not Progressing   Problem: Education: Goal: Understanding of CV disease, CV risk reduction, and recovery process will improve Outcome: Not Progressing Goal: Individualized  Educational Video(s) Outcome: Not Progressing   Problem: Activity: Goal: Ability to return to baseline activity level will improve Outcome: Not Progressing   Problem: Cardiovascular: Goal: Ability to achieve and maintain adequate cardiovascular perfusion will improve Outcome: Not Progressing Goal: Vascular access site(s) Level 0-1 will be maintained Outcome: Not Progressing   Problem: Health Behavior/Discharge Planning: Goal: Ability to safely manage health-related needs after discharge will improve Outcome: Not Progressing

## 2023-10-04 NOTE — Progress Notes (Addendum)
Pt tachycardic, came to assess at bedside   Pt alert but not very conversational, not offering much history or detailed answers to questions. I asked about chest pain, she nods, I asked where and she said nothing, when prompted she shaked head 'yes' to pain in entire chest, pain everywhere, nausea; shakes head 'no' to worsening breathing  Tachycardic VS otherwise stable (43% SpO2 assumed to be in error)  CV: reg rhythm, tachycardic L: poor inspiratory effort but otherwise clear  Abd: nontender, nondistended  A/P:  EKG Place on telemetry  1L NS bolus  Pain meds IV - RN reports she is not taking po meds   EKG no obvious ACS, troponin would almost certainly be high and given her platelets and overall functional status she would be high risk for cardiac intervention or heparin. Will not pursue ACS w/u. Family in agreement. We discussed will trial fluids and pain control to treat likely underlying cause, monitor on telemetry. Risk of cardiac decompensation is high. Confirmed NO CPR.   Can trial IV metoprolol to see if this breaks the tachycardia but I'm cautious here to reduce BP, will see how VS are looking w/ 1L administered.   See IPAL note   Critical care time 45 min

## 2023-10-04 NOTE — Progress Notes (Signed)
Nutrition Follow-up  DOCUMENTATION CODES:   Not applicable  INTERVENTION:   Ensure Enlive po TID, each supplement provides 350 kcal and 20 grams of protein.  Magic cup TID with meals, each supplement provides 290 kcal and 9 grams of protein  Liberalize diet   MVI po daily   Pt remains at high refeed risk; recommend monitor potassium, magnesium and phosphorus labs daily until stable  Daily weights   NUTRITION DIAGNOSIS:   Increased nutrient needs related to cancer and cancer related treatments as evidenced by estimated needs. -ongoing   GOAL:   Patient will meet greater than or equal to 90% of their needs -not met   MONITOR:   PO intake, Supplement acceptance, Labs, Weight trends, I & O's, Skin  ASSESSMENT:   47 y.o. 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  -Pt s/p abdominal aortogram with left lower extremity angiogram with angioplasty and stent placement of the left common iliac artery 11/26 -Pt s/p thoracentesis today with output     Visited pt's room today. Pt sleeping at time of RD visit and is unable to arouse. Pt with ongoing poor appetite and oral intake but oral intake does seem to be improving some. Pt is drinking some of her Ensure supplements. Recommend continue supplements and liberal diet. Pt remains at refeed risk. Per chart, pt is weight stable since admission. Palliative care is following.   Medications reviewed and include: aspirin, plavix, lovenox, lasix, heparin, MVI, protonix, oxycodone, senokot, NaCl tabs, zosyn   Labs reviewed: Na 130(L), K 4.5 wnl Mg 1.9 wnl- 12/2 P 3.4 wnl- 11/29 Hgb 7.3(L), Hct 22.4(L)  Drains-   Diet Order:   Diet Order             Diet regular Fluid consistency:  Thin; Fluid restriction: 1500 mL Fluid  Diet effective now                  EDUCATION NEEDS:   Education needs have been addressed  Skin:  Skin Assessment: Reviewed RN Assessment  Last BM:  11/30- per pt report  Height:   Ht Readings from Last 1 Encounters:  09/19/23 5\' 6"  (1.676 m)    Weight:   Wt Readings from Last 1 Encounters:  10/03/23 70.6 kg    Ideal Body Weight:  59.1 kg  BMI:  Body mass index is 25.12 kg/m.  Estimated Nutritional Needs:   Kcal:  1800-2100kcal/day  Protein:  90-105 grams  Fluid:  1.8-2.1L/day  Betsey Holiday MS, RD, LDN Please refer to [facility] for RD and/or RD on-call/weekend/after hours pager

## 2023-10-04 NOTE — IPAL (Signed)
  Interdisciplinary Goals of Care Family Meeting   Date carried out: 10/04/2023  Location of the meeting: Bedside  Member's involved: Physician and Family Member or next of kin (patient's SO in person and patient's daughter on the phone)   Durable Power of Attorney or Environmental health practitioner: NOK    Discussion: We discussed goals of care for [employer] .   New onset tachycardia see progress note. Pt reports pain all over, no localized chest pain. We discussed best case scenario - continue treatment for pneumonia/tachycardia and possibly she will stabilize. However I am not optimistic for this given her overall decline, low functional reserve, and she has not been eating/drinking. I am anticipating a "worst case" in which she continues to deteriorate despite best efforts and may have a cardiac or respiratory arrest, or other organ failure. If this worst case becomes reality such that we would consider treating her pain/suffering primarily and foregoing other aggressive measures, a comfort-based-only approach is always available. SO and daughter indicate understanding and acceptance of the possibility that we have done all we can do for her. For now will continue aggressive measures. Pt is making minimal conversation but nods in apparent understanding and asks no questions. Family's questions all answered.     Code status:   Code Status: Limited: Do not attempt resuscitation (DNR) -DNR-LIMITED -Do Not Intubate/DNI    Disposition: Continue current acute care  Time spent for the meeting: 35 min     Sunnie Nielsen, DO  10/04/2023, 6:37 PM

## 2023-10-04 NOTE — Procedures (Signed)
PROCEDURE SUMMARY:  Successful US guided left thoracentesis. Yielded 600 mL of amber fluid. Patient tolerated procedure well. No immediate complications. EBL = trace  Post procedure chest X-ray reveals no pneumothorax  Jakyrah Holladay S Milon Dethloff PA-C 10/04/2023 12:44 PM

## 2023-10-04 NOTE — Progress Notes (Signed)
PROGRESS NOTE    Sheryl Silva   UJW:[id number] DOB: [birth year]  DOA: 09/08/2023 Date of Service: 10/04/23 which is hospital day 25  PCP: [name], MD    HPI: Sheryl Silva is a 47 y.o. 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  Also with recent hospitalization for CMV colitis and has been managed on valganciclovir by ID presented to the emergency room with abnormal labs, generalized abdominal pain, nausea, poor appetite.     Hospital course / significant events:  11/07: admitted to hospitalist service w/ acute cholecystitis  11/08: chole drain placed  Worsening abdominal pain difficult to control, she was requesting IV pain meds (although she hadn't maximized her home oxycodone), later started on Dilaudid PCA which has since weaned off.  Palliative care was consulted.   11/18: hyponatremia to 119, to SDU for hypertonic saline, nephrology consulted  Sodium improving/stable Awaiting SNF in [place] to be closer to family, TOC working on this see notes from today 10/03/23  12/02: temp and HR up, WBC 12.6, infectious w/u --> (+)pneumonia started Zosyn, (+)pleural effusion, also ruled out PE.  12/03: thoracentesis yield 600 mL. Palliative d/w patient, code status updated to DNR     Consultants:  General Surgery Oncology Palliative Care Interventional Radiology Infectious Disease  Nephrology Vascular Surgery  GYN Oncology  Procedures/Surgeries: 11/08: Chole tube placement 10 Fr to bag drain - Dr. Juliette Alcide (IR) 11/26: angioplasty and stent placement left common iliac artery - Dr Gilda Crease (Vascular) 12/03: L thoracentesis       ASSESSMENT &  PLAN:   Sepsis d/t HCAP Fever, malaise, leukocytosis  Zosyn Supplemental O2  L pleural effusion Thoracentesis labs pending, malignant vs d/t pneumonia   Acute cholecystitis  S/p chole tube 11/08 Concern of acute cholecystitis complicated by history of stage IV GI signet cell adenocarcinoma completed 14 days of zosyn not a good candidate for surgical intervention. S/p cholecystostomy drain placement by IR on 11/8 cont drain management gen surg f/u 6 weeks IR f/u prn for drain issues    left groin pain likely due to ovarian mass Complaining of worsening pain was started on PCA dilaudid on 11/9, however, pain does not appear to be from upper abdomen.  possibly due to left adnexal mass that caused torsion and cut off blood supply to ovary.  US pelvic with doppler, which showed known left ovary mass with reduced blood flow. Onc palliative care consulted GynOnc consulted, did not rec surgery. Continue pain management in cooperation w/ onc palliative care provider   Cancer pain Stage IV metastatic signet ring adenocarcinoma: Pt reported pain still uncontrolled despite being on a high dose opioids Fentanyl patch switched to oxycontin 60 mg BID with better pain control cont oxycontin 60 mg BID and oxycodone 15 mg PRN. Palliative care following    Anxiety cont Xanax PRN   H/O CMV colitis: Pt was on valganciclovir.  Most recent CMV DNA from 09/08/23 was neg.  Pt saw ID Dr. Rivka Safer on 09/08/23,  holding ganciclovir while not on chemo.  But if she plans to start chemo she will have to be on suppressive therapy with 900mg  VALGanciclovir every day.   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 Na improving on current regimen, now low 130's. Cannot give tolvaptan given transaminitis.  cont salt tab lasix 20 mg daily cont fluid restriction 1500 ml daily   PAD with ischemic left 2nd toe S/p angiogram on 11/26 with stent placement ABI showed Left ankle-brachial index of 0.60, consistent with moderate arterial occlusive disease.   ASA and plavix f/u with vascular in 3 months   Constipation, resolved Bowel regimen as needed  Hypokalemia Replace as needed Monitor BMP   Elevated temp, resolved blood cx neg.  Covid neg avoid scheduled tylenol in order to monitor for fever   Tachycardia  likely due to demand and cancer cont Lopressor     Normal weight based on BMI: Body mass index is 24.98 kg/m.  Underweight - under 18.5  normal weight - 18.5 to 24.9 overweight - 25 to 29.9 obese - 30 or more   DVT prophylaxis: lovenox IV fluids: no continuous IV fluids  Nutrition: regular diet, fluid restricted  Central lines / invasive devices: biliary tube  Code Status: FULL CODE ACP documentation reviewed: 10/02/23 and none on file in [facility]  TOC needs: placement Barriers to dispo / significant pending items: placement, now w/ pneumonia, if stable may be able to go next few days on po abx              Subjective / Brief ROS:  Patient reports no concerns at this time  Feeling weak Tolerating diet.  Reports no concerns w/ urination/defecation.   Family Communication: support person at bedside on rounds and daughter on the phone     Objective Findings:  Vitals:   10/03/23 2041 10/04/23 0828 10/04/23 1209 10/04/23 1228  BP: (!) 149/101 (!) 157/96 132/86 135/89  Pulse: (!) 120 (!) 116 86 89  Resp:  18    Temp:  98.3 F (36.8 C)    TempSrc:  Oral    SpO2:  94% (!) 87% (!) 89%  Weight:      Height:        Intake/Output Summary (Last 24 hours) at 10/04/2023 1349 Last data filed at 10/03/2023 2000 Gross per 24 hour  Intake --   Output 125 ml  Net -125 ml   Filed Weights   10/01/23 0502 10/02/23 0500 10/03/23 0052  Weight: 71.2 kg 70.2 kg 70.6 kg    Examination:  Physical Exam Constitutional:      General: She is not in acute distress. Cardiovascular:     Rate and Rhythm: Normal rate and regular rhythm.  Pulmonary:     Effort: Pulmonary effort is normal.  Abdominal:     General: Abdomen is flat. Bowel sounds are normal.     Palpations: Abdomen is soft.     Tenderness: There is no abdominal tenderness.  Skin:    General: Skin is warm and dry.  Neurological:     Mental Status: She is alert. Mental status is at baseline.          Scheduled Medications:   aspirin EC  81 mg Oral Daily   Chlorhexidine Gluconate Cloth  6 each Topical Daily   clopidogrel  75 mg Oral Daily   DULoxetine  60 mg Oral Daily   enoxaparin (LOVENOX) injection  40 mg Subcutaneous Q24H   feeding  supplement  237 mL Oral TID BM   furosemide  20 mg Oral Daily   gabapentin  600 mg Oral TID   heparin lock flush  500 Units Intracatheter Q30 days   hydrocortisone cream   Topical BID   metoprolol tartrate  50 mg Oral BID   multivitamin with minerals  1 tablet Oral Daily   oxyCODONE  40 mg Oral Q8H   pantoprazole  40 mg Oral BID   senna  1 tablet Oral Daily   sodium chloride flush  10-40 mL Intracatheter Q12H   sodium chloride  1 g Oral TID WC    Continuous Infusions:  ondansetron (ZOFRAN) IV Stopped (09/28/23 0900)   piperacillin-tazobactam (ZOSYN)  IV 3.375 g (10/04/23 0629)    PRN Medications:  acetaminophen **OR** acetaminophen, albuterol, ALPRAZolam, heparin lock flush **AND** heparin lock flush, iodixanol, magnesium hydroxide, ondansetron (ZOFRAN) IV, mouth rinse, [DISCONTINUED] oxyCODONE-acetaminophen **AND** oxyCODONE, prochlorperazine, sodium chloride flush  Antimicrobials from admission:  Anti-infectives (From admission, onward)    Start     Dose/Rate Route Frequency Ordered Stop   10/04/23 0645   piperacillin-tazobactam (ZOSYN) IVPB 3.375 g  Status:  Discontinued        3.375 g 100 mL/hr over 30 Minutes Intravenous  Once 10/04/23 0549 10/04/23 0553   10/04/23 0645  piperacillin-tazobactam (ZOSYN) IVPB 3.375 g        3.375 g 12.5 mL/hr over 240 Minutes Intravenous Every 8 hours 10/04/23 0554     09/27/23 1158  ceFAZolin (ANCEF) IVPB 2g/100 mL premix        2 g 200 mL/hr over 30 Minutes Intravenous 30 min pre-op 09/27/23 1158 09/27/23 1302   09/09/23 1515  cefOXitin (MEFOXIN) 2 g in sodium chloride 0.9 % 100 mL IVPB  Status:  Discontinued        2 g 200 mL/hr over 30 Minutes Intravenous On call 09/09/23 1507 09/10/23 0929   09/09/23 0600  piperacillin-tazobactam (ZOSYN) IVPB 3.375 g        3.375 g 12.5 mL/hr over 240 Minutes Intravenous Every 8 hours 09/09/23 0303 09/23/23 0513   09/08/23 2200  piperacillin-tazobactam (ZOSYN) IVPB 3.375 g  Status:  Discontinued        3.375 g 12.5 mL/hr over 240 Minutes Intravenous Every 8 hours 09/08/23 2128 09/08/23 2139   09/08/23 2200  piperacillin-tazobactam (ZOSYN) IVPB 3.375 g        3.375 g 12.5 mL/hr over 240 Minutes Intravenous  Once 09/08/23 2139 09/09/23 0232           Data Reviewed:  I have personally reviewed the following...  CBC: Recent Labs  Lab 09/28/23 0310 09/30/23 0518 10/03/23 0314 10/04/23 1100  WBC 9.7 10.2 12.6* 5.7  NEUTROABS  --   --   --  5.0  HGB 7.8* 7.2* 7.3* 7.3*  HCT 24.0* 22.6* 23.1* 22.4*  MCV 92.0 91.5 92.8 90.0  PLT 45* 39* 28* 23*   Basic Metabolic Panel: Recent Labs  Lab 09/28/23 0310 09/30/23 0518 10/03/23 0314 10/04/23 1100  NA 131* 132* 130* 130*  K 3.9 4.2 4.4 4.5  CL 99 98 95* 96*  CO2 24 24 24 24   GLUCOSE 104* 100* 110* 99  BUN 13 12 19  22*  CREATININE 0.43* 0.55 0.49 0.63  CALCIUM 8.0* 8.2* 8.0* 8.2*  MG 2.1 2.0 1.9  --   PHOS  --  3.4  --   --    GFR: Estimated Creatinine Clearance: 80.5 mL/min (by C-G formula based on SCr of  0.63 mg/dL). Liver Function Tests: No  results for input(s): "AST", "ALT", "ALKPHOS", "BILITOT", "PROT", "ALBUMIN" in the last 168 hours. No results for input(s): "LIPASE", "AMYLASE" in the last 168 hours. No results for input(s): "AMMONIA" in the last 168 hours. Coagulation Profile: No results for input(s): "INR", "PROTIME" in the last 168 hours. Cardiac Enzymes: No results for input(s): "CKTOTAL", "CKMB", "CKMBINDEX", "TROPONINI" in the last 168 hours. BNP (last 3 results) No results for input(s): "PROBNP" in the last 8760 hours. HbA1C: No results for input(s): "HGBA1C" in the last 72 hours. CBG: Recent Labs  Lab 10/02/23 0249  GLUCAP 98   Lipid Profile: No results for input(s): "CHOL", "HDL", "LDLCALC", "TRIG", "CHOLHDL", "LDLDIRECT" in the last 72 hours. Thyroid Function Tests: No results for input(s): "TSH", "T4TOTAL", "FREET4", "T3FREE", "THYROIDAB" in the last 72 hours. Anemia Panel: No results for input(s): "VITAMINB12", "FOLATE", "FERRITIN", "TIBC", "IRON", "RETICCTPCT" in the last 72 hours. Most Recent Urinalysis On File:     Component Value Date/Time   COLORURINE AMBER (A) 10/03/2023 2035   APPEARANCEUR HAZY (A) 10/03/2023 2035   LABSPEC 1.029 10/03/2023 2035   PHURINE 5.0 10/03/2023 2035   GLUCOSEU NEGATIVE 10/03/2023 2035   HGBUR LARGE (A) 10/03/2023 2035   BILIRUBINUR NEGATIVE 10/03/2023 2035   KETONESUR NEGATIVE 10/03/2023 2035   PROTEINUR 30 (A) 10/03/2023 2035   NITRITE NEGATIVE 10/03/2023 2035   LEUKOCYTESUR NEGATIVE 10/03/2023 2035   Sepsis Labs: @LABRCNTIP (procalcitonin:4,lacticidven:4) Microbiology: Recent Results (from the past 240 hour(s))  SARS Coronavirus 2 by RT PCR (hospital order, performed in [facility] hospital lab) *cepheid single result test* Anterior Nasal Swab     Status: None   Collection Time: 09/24/23  8:30 PM   Specimen: Anterior Nasal Swab  Result Value Ref Range Status   SARS Coronavirus 2 by RT PCR NEGATIVE NEGATIVE Final    Comment: (NOTE) SARS-CoV-2 target nucleic  acids are NOT DETECTED.  The SARS-CoV-2 RNA is generally detectable in upper and lower respiratory specimens during the acute phase of infection. The lowest concentration of SARS-CoV-2 viral copies this assay can detect is 250 copies / mL. A negative result does not preclude SARS-CoV-2 infection and should not be used as the sole basis for treatment or other patient management decisions.  A negative result may occur with improper specimen collection / handling, submission of specimen other than nasopharyngeal swab, presence of viral mutation(s) within the areas targeted by this assay, and inadequate number of viral copies (<250 copies / mL). A negative result must be combined with clinical observations, patient history, and epidemiological information.  Fact Sheet for Patients:   RoadLapTop.co.za  Fact Sheet for Healthcare Providers: http://kim-miller.com/  This test is not yet approved or  cleared by the [place] FDA and has been authorized for detection and/or diagnosis of SARS-CoV-2 by FDA under an Emergency Use Authorization (EUA).  This EUA will remain in effect (meaning this test can be used) for the duration of the COVID-19 declaration under Section 564(b)(1) of the Act, 21 U.S.C. section 360bbb-3(b)(1), unless the authorization is terminated or revoked sooner.  Performed at [facility], [address] [place], [place] [address]   Culture, blood (Routine X 2) w Reflex to ID Panel     Status: None (Preliminary result)   Collection Time: 10/03/23  5:27 PM   Specimen: BLOOD  Result Value Ref Range Status   Specimen Description BLOOD RIGHT ANTECUBITAL  Final   Special Requests   Final    BOTTLES DRAWN AEROBIC AND ANAEROBIC Blood  Culture adequate volume   Culture   Final    NO GROWTH < 24 HOURS Performed at [facility], [address] [place], [place] [address]    Report Status PENDING  Incomplete   Culture, blood (Routine X 2) w Reflex to ID Panel     Status: None (Preliminary result)   Collection Time: 10/03/23 11:00 PM   Specimen: BLOOD  Result Value Ref Range Status   Specimen Description BLOOD BLOOD LEFT ARM LAC  Final   Special Requests   Final    BOTTLES DRAWN AEROBIC AND ANAEROBIC Blood Culture results may not be optimal due to an inadequate volume of blood received in culture bottles   Culture   Final    NO GROWTH < 12 HOURS Performed at [facility], [address], [place], [place] [address]    Report Status PENDING  Incomplete      Radiology Studies last 3 days: DG Chest Port 1 View  Result Date: 10/04/2023 CLINICAL DATA:  Pleural effusion.  Status post thoracentesis. EXAM: PORTABLE CHEST 1 VIEW COMPARISON:  October 03, 2023. FINDINGS: No pneumothorax is noted status post thoracentesis. No significant pleural effusion is noted currently. Diffuse osseous metastatic disease is noted. IMPRESSION: No pneumothorax status post thoracentesis. Electronically Signed   By: Lupita Raider M.D.   On: 10/04/2023 13:05   CT Angio Chest Pulmonary Embolism (PE) W or WO Contrast  Result Date: 10/03/2023 CLINICAL DATA:  Pulmonary embolus suspected with high probability. Shortness of breath and tachycardia. Cancer patient. EXAM: CT ANGIOGRAPHY CHEST WITH CONTRAST TECHNIQUE: Multidetector CT imaging of the chest was performed using the standard protocol during bolus administration of intravenous contrast. Multiplanar CT image reconstructions and MIPs were obtained to evaluate the vascular anatomy. RADIATION DOSE REDUCTION: This exam was performed according to the departmental dose-optimization program which includes automated exposure control, adjustment of the mA and/or kV according to patient size and/or use of iterative reconstruction technique. CONTRAST:  75mL OMNIPAQUE IOHEXOL 350 MG/ML SOLN COMPARISON:  Chest radiograph 10/03/2023.  CT chest 08/20/2023 FINDINGS: Cardiovascular:  Technically adequate study with good opacification of the central and segmental pulmonary arteries. No focal filling defects. No evidence of significant pulmonary embolus. Normal heart size. Small pericardial effusion. Normal caliber thoracic aorta. No aortic dissection. Great vessel origins are patent. Right central venous catheter with tip in the right atrium. Mediastinum/Nodes: Esophagus is decompressed. Thyroid gland is unremarkable. Prominent lymph nodes are demonstrated in the axilla bilaterally and extending into the supraclavicular regions. Largest left axillary lymph nodes measure 2.3 cm short axis dimension. Scattered mediastinal lymph nodes are also mildly enlarged. These are likely metastatic. Similar appearance to previous study. Lungs/Pleura: Large left pleural effusion. Atelectasis or consolidation in both lung bases, greater on the left. Patchy airspace disease demonstrated throughout the remainder of both lungs. This could represent multifocal pneumonia or aspiration. The parenchymal process is progressing since the prior study. Upper Abdomen: Moderate upper abdominal ascites. Poorly defined hypodense lesion demonstrated in the lateral segment left lobe of liver measuring 2 cm diameter. This is incompletely included but may represent a metastatic focus. Musculoskeletal: Diffuse heterogeneous sclerosis throughout the visualized skeleton consistent with diffuse bone metastasis. Review of the MIP images confirms the above findings. IMPRESSION: 1. No evidence of significant pulmonary embolus. 2. Large left pleural effusion. Basilar atelectasis or consolidation in both lower lungs. 3. Progressing patchy airspace disease throughout both lungs likely representing multifocal pneumonia or possibly aspiration. 4. Metastatic lymphadenopathy in the chest is similar to  prior study. 5. Focal liver lesion is possibly metastatic. Incompletely included within this study. 6. Moderate upper abdominal ascites.  Small  pericardial effusion. 7. Diffuse skeletal metastasis. Electronically Signed   By: Burman Nieves M.D.   On: 10/03/2023 22:40   DG Chest Port 1 View  Result Date: 10/03/2023 CLINICAL DATA:  Fever EXAM: PORTABLE CHEST 1 VIEW COMPARISON:  09/18/2023 FINDINGS: Right-sided central venous port tip at the right atrium. Low lung volumes. Increasing consolidation at left base. Small left-sided pleural effusion. Stable cardiomediastinal silhouette. Diffuse sclerosis corresponding to history of metastatic disease. IMPRESSION: Increasing consolidation at left base, atelectasis versus pneumonia with interim small left effusion. Electronically Signed   By: Jasmine Pang M.D.   On: 10/03/2023 21:12       Time spent: 50 min    Sunnie Nielsen, DO [facility] 10/04/2023, 1:49 PM    Dictation software may have been used to generate the above note. Typos may occur and escape review in typed/dictated notes. Please contact Dr Lyn Hollingshead directly for clarity if needed.  Staff may message me via secure chat in Epic  but this may not receive an immediate response,  please page me for urgent matters!  If 7PM-7AM, please contact night coverage [email or web address]

## 2023-10-04 NOTE — TOC Progression Note (Signed)
Transition of Care [facility]) - Progression Note    Patient Details  Name: Sheryl Silva MRN: [id number] Date of Birth: 1976/05/13  [facility]) CM/SW Contact  Chapman Fitch, RN Phone Number: 10/04/2023, 10:02 AM  Clinical Narrative:       call to [facility] [phone]), spoke with [place] in admissions.  She confirms that they are in network with patient's insurance.  Referral secure emailed to [email or web address]  Per MD patient not medically stable for discharge       Expected Discharge Plan and Services                                               Social Determinants of Health (SDOH) Interventions SDOH Screenings   Food Insecurity: No Food Insecurity (09/09/2023)  Recent Concern: Food Insecurity - Food Insecurity Present (07/22/2023)  Housing: Low Risk  (09/09/2023)  Transportation Needs: No Transportation Needs (09/09/2023)  Recent Concern: Transportation Needs - Unmet Transportation Needs (07/22/2023)  Utilities: Not At Risk (09/09/2023)  Alcohol Screen: Low Risk  (02/15/2023)  Depression (PHQ2-9): Low Risk  (02/14/2023)  Financial Resource Strain: Low Risk  (05/09/2023)   [facility] from [facility]  Recent Concern: Financial Resource Strain - Medium Risk (02/15/2023)  Physical Activity: Inactive (02/15/2023)  Social Connections: Socially Isolated (02/15/2023)  Stress: Stress Concern Present (02/15/2023)  Tobacco Use: Medium Risk (09/08/2023)    Readmission Risk Interventions    09/10/2023    1:52 PM 08/22/2023   10:03 PM 08/04/2023    8:47 AM  Readmission Risk Prevention Plan  Transportation Screening Complete Complete   PCP or Specialist Appt within 3-5 Days Complete Complete Complete  HRI or Home Care Consult Complete Complete   Social Work Consult for Recovery Care Planning/Counseling Complete --   Palliative Care Screening Not Applicable Not Applicable   Medication Review  Oceanographer) Complete Complete

## 2023-10-05 DIAGNOSIS — C801 Malignant (primary) neoplasm, unspecified: Secondary | ICD-10-CM | POA: Diagnosis not present

## 2023-10-05 DIAGNOSIS — C799 Secondary malignant neoplasm of unspecified site: Secondary | ICD-10-CM | POA: Diagnosis not present

## 2023-10-05 DIAGNOSIS — Z515 Encounter for palliative care: Secondary | ICD-10-CM | POA: Diagnosis not present

## 2023-10-05 LAB — CBC
HCT: 22.1 % — ABNORMAL LOW (ref 36.0–46.0)
Hemoglobin: 7 g/dL — ABNORMAL LOW (ref 12.0–15.0)
MCH: 29.3 pg (ref 26.0–34.0)
MCHC: 31.7 g/dL (ref 30.0–36.0)
MCV: 92.5 fL (ref 80.0–100.0)
Platelets: 25 10*3/uL — CL (ref 150–400)
RBC: 2.39 MIL/uL — ABNORMAL LOW (ref 3.87–5.11)
RDW: 18.7 % — ABNORMAL HIGH (ref 11.5–15.5)
WBC: 11.4 10*3/uL — ABNORMAL HIGH (ref 4.0–10.5)
nRBC: 8.8 % — ABNORMAL HIGH (ref 0.0–0.2)

## 2023-10-05 LAB — BLOOD CULTURE ID PANEL (REFLEXED) - BCID2

## 2023-10-05 LAB — BASIC METABOLIC PANEL
Anion gap: 11 (ref 5–15)
BUN: 39 mg/dL — ABNORMAL HIGH (ref 6–20)
CO2: 22 mmol/L (ref 22–32)
Calcium: 7.3 mg/dL — ABNORMAL LOW (ref 8.9–10.3)
Chloride: 99 mmol/L (ref 98–111)
Creatinine, Ser: 1.86 mg/dL — ABNORMAL HIGH (ref 0.44–1.00)
GFR, Estimated: 33 mL/min — ABNORMAL LOW (ref >60)
Glucose, Bld: 104 mg/dL — ABNORMAL HIGH (ref 70–99)
Potassium: 5.5 mmol/L — ABNORMAL HIGH (ref 3.5–5.1)
Sodium: 132 mmol/L — ABNORMAL LOW (ref 135–145)

## 2023-10-05 LAB — GLUCOSE, CAPILLARY: Glucose-Capillary: 78 mg/dL (ref 70–99)

## 2023-10-05 LAB — MAGNESIUM: Magnesium: 2.1 mg/dL (ref 1.7–2.4)

## 2023-10-05 MED ORDER — DEXTROSE-SODIUM CHLORIDE 5-0.9 % IV SOLN: INTRAVENOUS | Status: AC

## 2023-10-05 MED ORDER — ACETAMINOPHEN 10 MG/ML IV SOLN: 1000.0000 mg | Freq: Four times a day (QID) | INTRAVENOUS | Status: AC | PRN

## 2023-10-05 MED ORDER — ACETAMINOPHEN 10 MG/ML IV SOLN
1000.0000 mg | Freq: Once | INTRAVENOUS | Status: AC
  Administered 2023-10-05: 1000 mg via INTRAVENOUS
  Filled 2023-10-05: qty 100

## 2023-10-05 MED ORDER — HYDROMORPHONE HCL 1 MG/ML IJ SOLN
2.0000 mg | INTRAMUSCULAR | Status: DC | PRN
  Administered 2023-10-05 – 2023-10-06 (×6): 2 mg via INTRAVENOUS
  Filled 2023-10-05 (×7): qty 2

## 2023-10-05 MED ORDER — SODIUM CHLORIDE 0.9 % IV BOLUS
500.0000 mL | Freq: Once | INTRAVENOUS | Status: AC
  Administered 2023-10-05: 500 mL via INTRAVENOUS

## 2023-10-05 NOTE — Progress Notes (Signed)
The registered nurse (RN) discussed the concept of comfort care with the patient and their family, explaining its focus on prioritizing the patient's comfort and quality of life at this stage. The RN clarified the distinction between comfort measures, which aim to alleviate symptoms and ensure comfort, and treatment measures, which focus on curing or managing the underlying disease. The disease process was reviewed to provide a clearer understanding of the patient's current condition, emphasizing that the primary goal is to maintain comfort and dignity. The RN reassured the patient and family of continued support during this challenging time.

## 2023-10-05 NOTE — Progress Notes (Signed)
PT Cancellation Note  Patient Details Name: Sheryl Silva MRN: [id number] DOB: [birth year]   Cancelled Treatment:    Reason Eval/Treat Not Completed: Medical issues which prohibited therapy Pt with red MEWS (HR 120-140s, BP 80s/60s, increased O2 need, etc) as well as HGB of 7.0.  Spoke with nurse who agrees pt is not appropriate for PT at this time and generally not doing well.  Will continue to follow from a distance and attempt to see pt at such time that she is appropriate for therapy.    Malachi Pro, DPT 10/05/2023, 8:49 AM

## 2023-10-05 NOTE — Progress Notes (Signed)
Palliative Medicine [facility] at [facility] [phone]) [phone] [phone]   Name: Sheryl Silva Date: 10/05/2023 MRN: [id number]  DOB: 03/02/1976  Patient Care Team: [name], MD as PCP - General (Internal Medicine) Benita Gutter, RN as Oncology Nurse Navigator Sheryl Ruths, MD as Consulting Physician (Oncology) Johnnette Barrios, RN as VBCI Care Management    REASON FOR CONSULTATION: Sheryl Silva is a 47 y.o. 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  Patient recently hospitalized 07/22/2023 to 08/04/2023 with CMV colitis treated with valganciclovir.  She was hospitalized again 08/20/2023 to 08/23/2023 with cholecystitis, which was treated conservatively.  Patient was readmitted 09/08/2023 with abdominal pain and again found to have acute cholecystitis.  Patient underwent cholecystostomy drain placed by IR on 11/8.  She has subsequently had ongoing abdominal pain.  Palliative care was consulted to address goals and manage ongoing symptoms.   CODE STATUS: DNR  PAST MEDICAL HISTORY: Past Medical History:  Diagnosis Date   Anxiety    Cancer (HCC)    Depression    Heart murmur    Hypertension     PAST SURGICAL HISTORY:  Past Surgical History:  Procedure Laterality Date   BIOPSY  07/26/2023   Procedure: BIOPSY;  Surgeon: Toney Reil, MD;  Location: [facility] ENDOSCOPY;  Service: Gastroenterology;;   CESAREAN SECTION     COLONOSCOPY WITH PROPOFOL N/A 02/21/2023   Procedure: COLONOSCOPY WITH PROPOFOL;  Surgeon: Regis Bill, MD;  Location: [facility] ENDOSCOPY;  Service: Endoscopy;  Laterality: N/A;   DILATION AND CURETTAGE OF UTERUS     FLEXIBLE SIGMOIDOSCOPY N/A 07/26/2023   Procedure: FLEXIBLE SIGMOIDOSCOPY;  Surgeon: Toney Reil, MD;  Location: [facility] ENDOSCOPY;  Service: Gastroenterology;  Laterality: N/A;   IR IMAGING GUIDED PORT INSERTION   02/16/2023   IR PERC CHOLECYSTOSTOMY  09/09/2023   LOWER EXTREMITY ANGIOGRAPHY Left 09/27/2023   Procedure: Lower Extremity Angiography;  Surgeon: Renford Dills, MD;  Location: [facility] INVASIVE CV LAB;  Service: Cardiovascular;  Laterality: Left;    HEMATOLOGY/ONCOLOGY HISTORY:  Oncology History  Signet ring cell adenocarcinoma (HCC)  02/07/2023 Cancer Staging   Staging form: Exocrine Pancreas, AJCC 8th Edition - Clinical stage from 02/07/2023: Stage IV (cTX, cNX, pM1) - Signed by Sheryl Ruths, MD on 02/14/2023 Stage prefix: Initial diagnosis   02/14/2023 Initial Diagnosis   Signet ring cell adenocarcinoma   03/02/2023 -  Chemotherapy   Patient is on Treatment Plan : GI ORIGIN FOLFOX+KEYTRUDA q21d x12 followed by Gwenlyn Fudge       ALLERGIES:  is allergic to nifedipine.  MEDICATIONS:  Current Facility-Administered Medications  Medication Dose Route Frequency Provider Last Rate Last Admin   acetaminophen (OFIRMEV) IV 1,000 mg  1,000 mg Intravenous Q6H PRN Sheryl Santa, MD       acetaminophen (TYLENOL) tablet 650 mg  650 mg Oral Q6H PRN Schnier, Latina Craver, MD   650 mg at 09/23/23 1037   Or   acetaminophen (TYLENOL) suppository 650 mg  650 mg Rectal Q6H PRN Schnier, Latina Craver, MD       albuterol (PROVENTIL) (2.5 MG/3ML) 0.083% nebulizer solution 2.5 mg  2.5 mg Inhalation Q6H PRN Schnier, Latina Craver, MD       ALPRAZolam Prudy Feeler) tablet 0.25 mg  0.25 mg Oral TID PRN Gilda Crease Latina Craver, MD   0.25 mg at 10/04/23 0551   Chlorhexidine Gluconate Cloth 2 % PADS 6 each  6 each Topical Daily Schnier,  Latina Craver, MD   6 each at 10/04/23 0903   dextrose 5 %-0.9 % sodium chloride infusion   Intravenous Continuous Sheryl Santa, MD 75 mL/hr at 10/05/23 1403 New Bag at 10/05/23 1403   feeding supplement (ENSURE ENLIVE / ENSURE PLUS) liquid 237 mL  237 mL Oral TID BM Schnier, Latina Craver, MD   237 mL at 10/03/23 1312   heparin lock flush 100 unit/mL  500 Units Intracatheter Q30 days Sheryl Nielsen, DO       And   heparin lock flush 100 unit/mL  500 Units Intracatheter PRN Sheryl Nielsen, DO   500 Units at 10/03/23 1037   hydrocortisone cream 1 %   Topical BID Schnier, Latina Craver, MD   Given at 10/03/23 1030   HYDROmorphone (DILAUDID) injection 2 mg  2 mg Intravenous Q2H PRN Sheryl Santa, MD   2 mg at 10/05/23 1529   iodixanol (VISIPAQUE) 320 MG/ML injection 5 mL  5 mL Other Once PRN Schnier, Latina Craver, MD       magnesium hydroxide (MILK OF MAGNESIA) suspension 30 mL  30 mL Oral Daily PRN Schnier, Latina Craver, MD   30 mL at 09/13/23 2112   ondansetron (ZOFRAN) 8 mg in sodium chloride 0.9 % 50 mL IVPB  8 mg Intravenous Q8H PRN Renford Dills, MD   Stopped at 09/28/23 0900   Oral care mouth rinse  15 mL Mouth Rinse PRN Schnier, Latina Craver, MD       oxyCODONE (Oxy IR/ROXICODONE) immediate release tablet 15 mg  15 mg Oral Q4H PRN Schnier, Latina Craver, MD   15 mg at 10/04/23 1148   prochlorperazine (COMPAZINE) injection 5 mg  5 mg Intravenous Q6H PRN Renford Dills, MD   5 mg at 09/28/23 1026   sodium chloride flush (NS) 0.9 % injection 10-40 mL  10-40 mL Intracatheter Q12H Sheryl Nielsen, DO   10 mL at 10/04/23 2058   sodium chloride flush (NS) 0.9 % injection 10-40 mL  10-40 mL Intracatheter PRN Sheryl Nielsen, DO       sodium chloride tablet 1 g  1 g Oral TID WC Schnier, Latina Craver, MD   1 g at 10/04/23 0901   Facility-Administered Medications Ordered in Other Encounters  Medication Dose Route Frequency Provider Last Rate Last Admin   heparin lock flush 100 unit/mL  500 Units Intravenous Once Gimena Buick, Sheryl Eastern, NP       sodium chloride flush (NS) 0.9 % injection 10 mL  10 mL Intracatheter PRN Sheryl Ruths, MD   10 mL at 06/29/23 1430    VITAL SIGNS: BP 106/72   Pulse (!) 115   Temp 98.5 F (36.9 C) (Axillary)   Resp (!) 24   Ht 5\' 6"  (1.676 m)   Wt 155 lb 10.3 oz (70.6 kg)   SpO2 90%   BMI 25.12 kg/m  Filed Weights   10/01/23 0502 10/02/23 0500  10/03/23 0052  Weight: 156 lb 15.5 oz (71.2 kg) 154 lb 12.2 oz (70.2 kg) 155 lb 10.3 oz (70.6 kg)    Estimated body mass index is 25.12 kg/m as calculated from the following:   Height as of this encounter: 5\' 6"  (1.676 m).   Weight as of this encounter: 155 lb 10.3 oz (70.6 kg).  LABS: CBC:    Component Value Date/Time   WBC 11.4 (H) 10/05/2023 0530   HGB 7.0 (L) 10/05/2023 0530   HGB 8.1 (L) 09/08/2023 1332   HCT 22.1 (L) 10/05/2023  0530   PLT 25 (LL) 10/05/2023 0530   PLT 166 09/08/2023 1332   MCV 92.5 10/05/2023 0530   NEUTROABS 5.0 10/04/2023 1100   LYMPHSABS 0.4 (L) 10/04/2023 1100   MONOABS 0.1 10/04/2023 1100   EOSABS 0.0 10/04/2023 1100   BASOSABS 0.0 10/04/2023 1100   Comprehensive Metabolic Panel:    Component Value Date/Time   NA 132 (L) 10/05/2023 0530   K 5.5 (H) 10/05/2023 0530   CL 99 10/05/2023 0530   CO2 22 10/05/2023 0530   BUN 39 (H) 10/05/2023 0530   CREATININE 1.86 (H) 10/05/2023 0530   CREATININE 0.50 09/08/2023 1332   GLUCOSE 104 (H) 10/05/2023 0530   CALCIUM 7.3 (L) 10/05/2023 0530   AST 30 09/14/2023 0452   AST 683 (HH) 09/08/2023 1332   ALT 34 09/14/2023 0452   ALT 324 (HH) 09/08/2023 1332   ALKPHOS 516 (H) 09/14/2023 0452   BILITOT 0.9 09/14/2023 0452   BILITOT 1.3 (H) 09/08/2023 1332   PROT 6.5 09/14/2023 0452   ALBUMIN 2.4 (L) 09/14/2023 0452    RADIOGRAPHIC STUDIES: US THORACENTESIS ASP PLEURAL SPACE W/IMG GUIDE  Result Date: 10/04/2023 INDICATION: Metastatic signet ring adenocarcinoma with left pleural effusion. Request for diagnostic and therapeutic thoracentesis. EXAM: ULTRASOUND GUIDED LEFT THORACENTESIS MEDICATIONS: 1% lidocaine 10 mL COMPLICATIONS: None immediate. PROCEDURE: An ultrasound guided thoracentesis was thoroughly discussed with the patient and questions answered. The benefits, risks, alternatives and complications were also discussed. The patient understands and wishes to proceed with the procedure. Written consent was  obtained. Ultrasound was performed to localize and mark an adequate pocket of fluid in the left chest. The area was then prepped and draped in the normal sterile fashion. 1% Lidocaine was used for local anesthesia. Under ultrasound guidance a 6 Fr Safe-T-Centesis catheter was introduced. Thoracentesis was performed. The catheter was removed and a dressing applied. FINDINGS: A total of approximately 600 mL of clear amber fluid was removed. Samples were sent to the laboratory as requested by the clinical team. IMPRESSION: Successful ultrasound guided left thoracentesis yielding 600 mL of pleural fluid. No pneumothorax on post-procedure chest x-ray. Procedure performed by: Corrin Parker, PA-C Electronically Signed   By: Gilmer Mor D.O.   On: 10/04/2023 13:54   DG Chest Port 1 View  Result Date: 10/04/2023 CLINICAL DATA:  Pleural effusion.  Status post thoracentesis. EXAM: PORTABLE CHEST 1 VIEW COMPARISON:  October 03, 2023. FINDINGS: No pneumothorax is noted status post thoracentesis. No significant pleural effusion is noted currently. Diffuse osseous metastatic disease is noted. IMPRESSION: No pneumothorax status post thoracentesis. Electronically Signed   By: Lupita Raider M.D.   On: 10/04/2023 13:05   CT Angio Chest Pulmonary Embolism (PE) W or WO Contrast  Result Date: 10/03/2023 CLINICAL DATA:  Pulmonary embolus suspected with high probability. Shortness of breath and tachycardia. Cancer patient. EXAM: CT ANGIOGRAPHY CHEST WITH CONTRAST TECHNIQUE: Multidetector CT imaging of the chest was performed using the standard protocol during bolus administration of intravenous contrast. Multiplanar CT image reconstructions and MIPs were obtained to evaluate the vascular anatomy. RADIATION DOSE REDUCTION: This exam was performed according to the departmental dose-optimization program which includes automated exposure control, adjustment of the mA and/or kV according to patient size and/or use of iterative  reconstruction technique. CONTRAST:  75mL OMNIPAQUE IOHEXOL 350 MG/ML SOLN COMPARISON:  Chest radiograph 10/03/2023.  CT chest 08/20/2023 FINDINGS: Cardiovascular: Technically adequate study with good opacification of the central and segmental pulmonary arteries. No focal filling defects. No evidence of significant pulmonary  embolus. Normal heart size. Small pericardial effusion. Normal caliber thoracic aorta. No aortic dissection. Great vessel origins are patent. Right central venous catheter with tip in the right atrium. Mediastinum/Nodes: Esophagus is decompressed. Thyroid gland is unremarkable. Prominent lymph nodes are demonstrated in the axilla bilaterally and extending into the supraclavicular regions. Largest left axillary lymph nodes measure 2.3 cm short axis dimension. Scattered mediastinal lymph nodes are also mildly enlarged. These are likely metastatic. Similar appearance to previous study. Lungs/Pleura: Large left pleural effusion. Atelectasis or consolidation in both lung bases, greater on the left. Patchy airspace disease demonstrated throughout the remainder of both lungs. This could represent multifocal pneumonia or aspiration. The parenchymal process is progressing since the prior study. Upper Abdomen: Moderate upper abdominal ascites. Poorly defined hypodense lesion demonstrated in the lateral segment left lobe of liver measuring 2 cm diameter. This is incompletely included but may represent a metastatic focus. Musculoskeletal: Diffuse heterogeneous sclerosis throughout the visualized skeleton consistent with diffuse bone metastasis. Review of the MIP images confirms the above findings. IMPRESSION: 1. No evidence of significant pulmonary embolus. 2. Large left pleural effusion. Basilar atelectasis or consolidation in both lower lungs. 3. Progressing patchy airspace disease throughout both lungs likely representing multifocal pneumonia or possibly aspiration. 4. Metastatic lymphadenopathy in the  chest is similar to prior study. 5. Focal liver lesion is possibly metastatic. Incompletely included within this study. 6. Moderate upper abdominal ascites.  Small pericardial effusion. 7. Diffuse skeletal metastasis. Electronically Signed   By: Burman Nieves M.D.   On: 10/03/2023 22:40   DG Chest Port 1 View  Result Date: 10/03/2023 CLINICAL DATA:  Fever EXAM: PORTABLE CHEST 1 VIEW COMPARISON:  09/18/2023 FINDINGS: Right-sided central venous port tip at the right atrium. Low lung volumes. Increasing consolidation at left base. Small left-sided pleural effusion. Stable cardiomediastinal silhouette. Diffuse sclerosis corresponding to history of metastatic disease. IMPRESSION: Increasing consolidation at left base, atelectasis versus pneumonia with interim small left effusion. Electronically Signed   By: Jasmine Pang M.D.   On: 10/03/2023 21:12   PERIPHERAL VASCULAR CATHETERIZATION  Result Date: 09/27/2023 See surgical note for result.  US ARTERIAL ABI (SCREENING LOWER EXTREMITY)  Result Date: 09/24/2023 CLINICAL DATA:  47 year old female with left toe pain. EXAM: NONINVASIVE PHYSIOLOGIC VASCULAR STUDY OF BILATERAL LOWER EXTREMITIES TECHNIQUE: Evaluation of both lower extremities were performed at rest, including calculation of ankle-brachial indices with single level Doppler, pressure and pulse volume recording. COMPARISON:  None Available. FINDINGS: Right ABI:  0.97 Left ABI:  0.60 Right Lower Extremity:  Normal arterial waveforms at the ankle. Left Lower Extremity:  Monophasic arterial waveforms at the ankle. IMPRESSION: 1. Left ankle-brachial index of 0.60, consistent with moderate arterial occlusive disease. 2. Normal right ankle-brachial index. Marliss Coots, MD [facility] Electronically Signed   By: Marliss Coots M.D.   On: 09/24/2023 07:23   DG Chest Port 1 View  Result Date: 09/18/2023 CLINICAL DATA:  Metastatic adenocarcinoma,  dyspnea. EXAM: PORTABLE CHEST 1 VIEW COMPARISON:  08/20/2023. FINDINGS: Unremarkable cardiac silhouette. Right base consolidation or volume loss. Right-sided Port-A-Cath tip distal SVC. No pneumothorax. There may be small right-sided pleural effusion. Diffuse osteoblastic metastatic disease. IMPRESSION: Right base consolidation or volume loss. Possible small right-sided diffuse osteoblastic metastatic disease. Electronically Signed   By: Layla Maw M.D.   On: 09/18/2023 10:32   US PELVIC TRANSABD W/PELVIC DOPPLER  Result Date: 09/11/2023 CLINICAL DATA:  Left groin pain. History of stage for metastatic adenocarcinoma of the GI tract. EXAM:  TRANSABDOMINAL AND TRANSVAGINAL ULTRASOUND OF PELVIS DOPPLER ULTRASOUND OF OVARIES TECHNIQUE: Both transabdominal and transvaginal ultrasound examinations of the pelvis were performed. Transabdominal technique was performed for global imaging of the pelvis including uterus, ovaries, adnexal regions, and pelvic cul-de-sac. It was necessary to proceed with endovaginal exam following the transabdominal exam to visualize the uterus and ovaries. Color and duplex Doppler ultrasound was utilized to evaluate blood flow to the ovaries. COMPARISON:  CT AP 09/08/2023 FINDINGS: Uterus Measurements: 8.2 x 4.0 x 5.6 cm = volume: 96 mL. No fibroids or other mass visualized. Endometrium Thickness: 4.1 mm.  No focal abnormality visualized. Right ovary Not visualized. Left ovary Left adnexal mass and left ovary are indistinguishable measuring 5.3 x 5.9 x 5.5 cm. Pulsed Doppler evaluation of both ovaries demonstrates There is decreased color Doppler flow to the left adnexal mass. No arterial waveforms identified within left adnexal mass/left ovary. Normal venous waveforms were noted. Other findings Small volume of free fluid noted within the pelvis. IMPRESSION: 1. Left adnexal mass and left ovary are indistinguishable measuring 5.3 x 5.9 x 5.5 cm. There is decreased color Doppler flow to  the left adnexal mass. No arterial waveforms identified within left adnexal mass/left ovary. Normal venous waveforms were noted. Imaging findings are indeterminate but torsion cannot be excluded. 2. Small volume of free fluid noted within the pelvis. 3. Right ovary not visualized. Electronically Signed   By: Signa Kell M.D.   On: 09/11/2023 16:21   IR Perc Cholecystostomy  Result Date: 09/10/2023 INDICATION: Acute cholecystitis EXAM: Placement of percutaneous cholecystostomy tube using ultrasound and fluoroscopic guidance MEDICATIONS: Documented in the EMR ANESTHESIA/SEDATION: Moderate (conscious) sedation was employed during this procedure. A total of Versed 2 mg and Fentanyl 100 mcg was administered intravenously by the radiology nurse. Total intra-service moderate Sedation Time: 18 minutes. The patient's level of consciousness and vital signs were monitored continuously by radiology nursing throughout the procedure under my direct supervision. FLUOROSCOPY: Radiation Exposure Index (as provided by the fluoroscopic device): 1.5 minutes (8 mGy) COMPLICATIONS: None immediate. PROCEDURE: Informed written consent was obtained from the patient after a thorough discussion of the procedural risks, benefits and alternatives. All questions were addressed. Maximal Sterile Barrier Technique was utilized including caps, mask, sterile gowns, sterile gloves, sterile drape, hand hygiene and skin antiseptic. A timeout was performed prior to the initiation of the procedure. The patient was placed supine on the exam table. The right upper quadrant was prepped and draped in the standard sterile fashion. Ultrasound of the right upper quadrant was performed for planning purposes. This again demonstrated a distended gallbladder with wall edema and sludge, consistent with acute cholecystitis. An infracostal transhepatic approach was planned. Skin entry site was marked, and local analgesia was obtained with 1% lidocaine. Under  ultrasound guidance, percutaneous access was obtained into the gallbladder via an infracostal transhepatic approach using a 21 gauge Chiba needle. Access was confirmed with visualization of needle tip within the gallbladder lumen, and free return of bile. An 018 Nitrex wire was then advanced through the access needle and coiled within the gallbladder lumen. A transition dilator was advanced over this wire, through which an antegrade cholecystogram was performed. Antegrade cholecystogram demonstrated appropriate location in the gallbladder lumen and a distended gallbladder with gallstones. The cystic duct was not visualized. Over an Amplatz wire, the percutaneous tract was serially dilated followed by placement of a 10 French locking multipurpose drainage catheter into the gallbladder lumen. Locking loop was formed. Additional biliary sludge was drained. Gentle hand injection  of contrast material confirmed location of the gallbladder lumen. The drainage catheter was secured to the skin using silk suture and a dressing. It was placed to bag drainage. The patient tolerated the procedure well without immediate complication. IMPRESSION: Successful placement of a 10 French percutaneous cholecystostomy tube. Cholecystostomy tube placed to bag drainage. Additional follow-up recommendations per referring surgical service. Electronically Signed   By: Olive Bass M.D.   On: 09/10/2023 11:02   CT ABDOMEN PELVIS W CONTRAST  Result Date: 09/08/2023 CLINICAL DATA:  Elevated liver enzymes, nausea and diarrhea for 3 days, cholelithiasis with acute cholecystitis on previous imaging, history of metastatic adrenal carcinoma EXAM: CT ABDOMEN AND PELVIS WITH CONTRAST TECHNIQUE: Multidetector CT imaging of the abdomen and pelvis was performed using the standard protocol following bolus administration of intravenous contrast. RADIATION DOSE REDUCTION: This exam was performed according to the departmental dose-optimization program  which includes automated exposure control, adjustment of the mA and/or kV according to patient size and/or use of iterative reconstruction technique. CONTRAST:  OMNIPAQUE IOHEXOL 300 MG/ML  SOLN COMPARISON:  08/21/2023, 08/20/2023 FINDINGS: Lower chest: Stable bibasilar hypoventilatory changes and scarring. No acute pleural or parenchymal lung disease. Hepatobiliary: There is a new 1 cm hypodensity within the left lobe liver, corresponding to hypoechoic lesion seen on today's ultrasound. Given history of metastatic adrenal carcinoma, new metastatic lesion is suspected. Multiple gallstones are again identified, with progressive gallbladder wall thickening and intramural edema consistent with acute cholecystitis. No evidence of choledocholithiasis. Pancreas: Unremarkable. No pancreatic ductal dilatation or surrounding inflammatory changes. Spleen: Normal in size without focal abnormality. Adrenals/Urinary Tract: The adrenals are unremarkable. The kidneys enhance normally. There is new mild left-sided hydronephrosis, with mucosal enhancement at the left UPJ suggesting obstructing mucosal lesion, reference image 43/2. No right-sided obstruction. No urinary tract calculi. The bladder is unremarkable. Stomach/Bowel: No bowel obstruction or ileus. Normal appendix right lower quadrant. No bowel wall thickening or inflammatory change. Vascular/Lymphatic: Stable atherosclerosis of the aorta. There is persistent retroperitoneal lymphadenopathy consistent with metastatic disease. Index left para-aortic lymph node image 44/2 measures 8 mm in short axis, stable. No new adenopathy. Reproductive: 5.1 x 3.9 cm left adnexal mass not appreciably changed since prior exam. Right ovary is unremarkable and stable. Stable uterine fibroid. Other: Trace pelvic free fluid unchanged. No free intraperitoneal gas. No abdominal wall hernia. Musculoskeletal: Diffuse sclerosis throughout the visualized bony structures compatible with bony  metastases. No acute or pathologic fracture. Reconstructed images demonstrate no additional findings. IMPRESSION: 1. Cholelithiasis with progressive gallbladder wall thickening and intramural edema consistent with worsening acute cholecystitis. 2. New 1 cm hypodensity within the left lobe liver, concerning for new liver metastasis. This was not evident on recent CT or MRI. 3. Persistent metastatic disease, with diffuse sclerotic bony metastases and retroperitoneal adenopathy unchanged. 4. Stable indeterminate left adnexal mass. 5. New mild left hydronephrosis, with subtle mucosal enhancement within the left ureter at the UPJ. Mucosal lesion cannot be excluded, and urologic consultation may be useful. 6.  Aortic Atherosclerosis (ICD10-I70.0). Electronically Signed   By: Sharlet Salina M.D.   On: 09/08/2023 21:43   US Abdomen Limited RUQ (LIVER/GB)  Result Date: 09/08/2023 CLINICAL DATA:  Quadrant pain for 3 days EXAM: ULTRASOUND ABDOMEN LIMITED RIGHT UPPER QUADRANT COMPARISON:  08/20/2023 FINDINGS: Gallbladder: Multiple shadowing gallstones are seen filling the gallbladder lumen. Gallbladder wall remains thickened measuring up to 8 mm, with prominent intramural edema now noted. Negative sonographic Murphy sign. Common bile duct: Diameter: 11 mm Liver: Normal liver echotexture. There is  a circumscribed 1.3 cm hypoechoic area within the left lobe liver, without associated abnormality on prior CT or MRI. Portal vein is patent on color Doppler imaging with normal direction of blood flow towards the liver. Other: None. IMPRESSION: 1. Cholelithiasis, with worsening gallbladder wall thickening compatible with progressive acute cholecystitis. 2. Stable dilated common bile duct. 3. Indeterminate 1.3 cm hypoechoic area left lobe liver, not clearly seen on previous CT or MRI. Electronically Signed   By: Sharlet Salina M.D.   On: 09/08/2023 20:59    PERFORMANCE STATUS (ECOG) : 2 - Symptomatic, <50% confined to  bed  Review of Systems Unless otherwise noted, a complete review of systems is negative.  Physical Exam General: NAD Cardiovascular: regular rate and rhythm Pulmonary: clear ant fields Abdomen: soft, nontender, + bowel sounds GU: no suprapubic tenderness Extremities: no edema, no joint deformities Skin: no rashes Neurological: Weakness but otherwise nonfocal  IMPRESSION: Prolonged hospitalization has been complicated by poorly controlled pain, hyponatremia, ischemic toe requiring angioplasty and iliac stent placement, and now with multifocal pneumonia/pleural effusion.  Follow-up visit.  Patient doing poorly.  Hypotensive overnight.  More lethargic and confused this morning.  Met with patient's daughter and sister.  Patient appears to be declining and likely approaching end-of-life.  If family opted to escalate care, patient would likely require transfer to ICU given hypotension and need for fluid resuscitation.  However, patient's overall prognosis is poor.  She is no longer a candidate for any cancer treatment.  Comfort care and hospice options were discussed in detail.  Ultimately, family interested in speaking with hospice liaison to explore option of hospice facility.  PLAN: -Recommend best supportive care and transfer to hospice when bed is available -DNR/DNI  Case and plan discussed with Dr. Orlie Dakin  Time Total: 45 minutes  Visit consisted of counseling and education dealing with the complex and emotionally intense issues of symptom management and palliative care in the setting of serious and potentially life-threatening illness.Greater than 50%  of this time was spent counseling and coordinating care related to the above assessment and plan.  Signed by: Laurette Schimke, PhD, NP-C

## 2023-10-05 NOTE — Progress Notes (Signed)
PHARMACY - PHYSICIAN COMMUNICATION CRITICAL VALUE ALERT - BLOOD CULTURE IDENTIFICATION (BCID)  Sheryl Silva is an 47 y.o. female who has had prolonged hospital stay and resume on antibiotics for fever  Assessment:  Blood culture from 12/2 with Gram negative diplococci in 1 of 4 bottles, BCID did not detect any organisms.  (N. Meningitidis is onlky gram neg diplococcus on panel).   Name of physician (or Provider) Contacted: Dr Dannial Monarch  Current antibiotics: piperacillin/tazobactam  Changes to prescribed antibiotics recommended:  Goals of care being finalized - possible Hospice care  Results for orders placed or performed during the hospital encounter of 09/08/23  Blood Culture ID Panel (Reflexed) (Collected: 10/03/2023  5:27 PM)  Result Value Ref Range   Enterococcus faecalis NOT DETECTED NOT DETECTED   Enterococcus Faecium NOT DETECTED NOT DETECTED   Listeria monocytogenes NOT DETECTED NOT DETECTED   Staphylococcus species NOT DETECTED NOT DETECTED   Staphylococcus aureus (BCID) NOT DETECTED NOT DETECTED   Staphylococcus epidermidis NOT DETECTED NOT DETECTED   Staphylococcus lugdunensis NOT DETECTED NOT DETECTED   Streptococcus species NOT DETECTED NOT DETECTED   Streptococcus agalactiae NOT DETECTED NOT DETECTED   Streptococcus pneumoniae NOT DETECTED NOT DETECTED   Streptococcus pyogenes NOT DETECTED NOT DETECTED   A.calcoaceticus-baumannii NOT DETECTED NOT DETECTED   Bacteroides fragilis NOT DETECTED NOT DETECTED   Enterobacterales NOT DETECTED NOT DETECTED   Enterobacter cloacae complex NOT DETECTED NOT DETECTED   Escherichia coli NOT DETECTED NOT DETECTED   Klebsiella aerogenes NOT DETECTED NOT DETECTED   Klebsiella oxytoca NOT DETECTED NOT DETECTED   Klebsiella pneumoniae NOT DETECTED NOT DETECTED   Proteus species NOT DETECTED NOT DETECTED   Salmonella species NOT DETECTED NOT DETECTED   Serratia marcescens NOT DETECTED NOT DETECTED   Haemophilus influenzae NOT DETECTED  NOT DETECTED   Neisseria meningitidis NOT DETECTED NOT DETECTED   Pseudomonas aeruginosa NOT DETECTED NOT DETECTED   Stenotrophomonas maltophilia NOT DETECTED NOT DETECTED   Candida albicans NOT DETECTED NOT DETECTED   Candida auris NOT DETECTED NOT DETECTED   Candida glabrata NOT DETECTED NOT DETECTED   Candida krusei NOT DETECTED NOT DETECTED   Candida parapsilosis NOT DETECTED NOT DETECTED   Candida tropicalis NOT DETECTED NOT DETECTED   Cryptococcus neoformans/gattii NOT DETECTED NOT DETECTED    Juliette Alcide, PharmD, BCPS, BCIDP Work Cell: [phone] 10/05/2023 11:46 AM

## 2023-10-05 NOTE — Progress Notes (Signed)
[facility] Progress Note  Patient: Sheryl Silva    GEX:[id number]  DOA: 09/08/2023     Date of Service: the patient was seen and examined on 10/05/2023  Chief Complaint  Patient presents with   Abnormal Labs   Brief hospital course: Tuwana Lofthouse is a 47 y.o. 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  Also with recent hospitalization for CMV colitis and has been managed on valganciclovir by ID presented to the emergency room with abnormal labs, generalized abdominal pain, nausea, poor appetite.        Hospital course / significant events:  11/07: admitted to hospitalist service w/ acute cholecystitis  11/08: chole drain placed  Worsening abdominal pain difficult to control, she was requesting IV pain meds (although she hadn't maximized her home oxycodone), later started on Dilaudid PCA which has since weaned off.  Palliative care was consulted.   11/18: hyponatremia to 119, to SDU for hypertonic saline, nephrology consulted  Sodium improving/stable Awaiting SNF in [place] to be closer to family, TOC working on this see notes from today 10/03/23  12/02: temp and HR up, WBC 12.6, infectious w/u --> (+)pneumonia started Zosyn, (+)pleural effusion, also ruled out PE.  12/03: thoracentesis yield 600 mL. Palliative d/w patient, code status updated to DNR   12/4 d/w family and agreed with hospice care placement.  Patient got excepted, awaiting for bed availability.  Comfort measure orders placed, RN may pronounce death.  Discontinued unnecessary medications, keep her comfortable and continue IV fluid and IV medications for pain  control.  Procedures/Surgeries: 11/08: Chole tube placement 10 Fr to bag drain - Dr. Juliette Alcide (IR) 11/26: angioplasty and stent placement left common iliac artery - Dr Gilda Crease (Vascular) 12/03: L thoracentesis   Assessment and Plan:  Comfort measures only started on 12/4 Discontinued all unnecessary medications, Continue IV fluid for hydration and continue IV pain medication for comfort   Patient was admitted on 09/08/2023 and I started taking care of her on 10/05/2023 During hospital stay patient was managed as below.  Sepsis d/t HCAP Fever, malaise, leukocytosis  Zosyn Supplemental O2   L pleural effusion Thoracentesis labs pending, malignant vs d/t pneumonia    Acute cholecystitis  S/p chole tube 11/08 Concern of acute cholecystitis complicated by history of stage IV GI signet cell adenocarcinoma completed 14 days of zosyn not a good candidate for surgical intervention. S/p cholecystostomy drain placement by IR on 11/8 cont drain management gen surg f/u 6 weeks IR f/u prn for drain issues    left groin pain likely due to ovarian mass Complaining of worsening pain was started on PCA dilaudid on 11/9, however, pain does not appear to be from upper abdomen.  possibly due to left adnexal mass that caused torsion and cut off blood supply to ovary.  US pelvic with doppler, which showed known left ovary mass with reduced blood flow. Onc palliative care consulted GynOnc consulted, did not rec surgery. Continue pain management in cooperation w/ onc palliative care provider   Cancer pain Stage IV metastatic signet ring adenocarcinoma: Pt reported pain still uncontrolled despite being on a high dose opioids Fentanyl patch switched to oxycontin 60 mg BID with better pain control cont oxycontin 60 mg BID and oxycodone 15 mg PRN. 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  Most recent CMV DNA from 09/08/23 was neg.  Pt saw ID  Dr. Rivka Safer on 09/08/23,  holding ganciclovir while not on chemo.  But if she plans to start chemo she will have to be on suppressive therapy with 900mg  VALGanciclovir every day.   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 Na improving on current regimen, now low 130's. Cannot give tolvaptan given transaminitis.  cont salt tab lasix 20 mg daily cont fluid restriction 1500 ml daily   PAD with ischemic left 2nd toe S/p angiogram on 11/26 with stent placement ABI showed Left ankle-brachial index of 0.60, consistent with moderate arterial occlusive disease.   ASA and plavix f/u with vascular in 3 months   Constipation, resolved Bowel regimen as needed   Hypokalemia Replace as needed Monitor BMP   Elevated temp, resolved blood cx neg.  Covid neg avoid scheduled tylenol in order to monitor for fever   Tachycardia  likely due to demand and cancer cont Lopressor     Body mass index is 25.12 kg/m.  Nutrition Problem: Increased nutrient needs Etiology: cancer and cancer related treatments Interventions: Interventions: Ensure Enlive (each supplement provides 350kcal and 20 grams of protein), MVI, Liberalize Diet      Diet: NPO, unable to tolerate DVT Prophylaxis: Comfort measures only  Advance goals of care discussion: DNR/DNI, comfort measures  Family Communication: family was present at bedside, at the time of interview.  The pt provided permission to discuss medical plan with the family. Opportunity was given to ask question and all questions were answered satisfactorily.   Disposition:  Pt is from Home, admitted with sepsis, metastatic adenocarcinoma,  condition gradually deteriorated, so goals of care discussed and patient was made DNR/DNI, and transition to comfort measures only. Patient got accepted for hospice, waiting for bed availability. Discharge to Hospice, when bed will be available.  Subjective: No significant overnight events, patient was lying comfortably, sleepy, she woke up by calling her name, she is AO x 2, seems lethargic and mildly confused. Family was at bedside, discussed management plan and agreed with hospice care.  Physical Exam: General: NAD, lying comfortably, Mild SOB Eyes: PERRLA ENT: Oral Mucosa Clear, dry Neck: no JVD,  Cardiovascular: S1 and S2 Present, no Murmur,  Respiratory: Equal air entry bilaterally, mild bibasilar crackles, no wheezing. Abdomen: Bowel Sound present, Soft and mild tenderness,  Skin: no rashes Extremities: no Pedal edema, no calf tenderness Neurologic: without any new focal findings, sleepy and lethargic, dozing off Gait not checked due to patient safety concerns  Vitals:   10/05/23 1111 10/05/23 1219 10/05/23 1233 10/05/23 1530  BP: 94/71 98/73 99/73  106/72  Pulse: (!) 115 (!) 115 (!) 39 (!) 115  Resp: 20 (!) 24    Temp: 98.4 F (36.9 C) 98.5 F (36.9 C)    TempSrc:  Axillary    SpO2: 92% 90% 90%   Weight:      Height:        Intake/Output Summary (Last 24 hours) at 10/05/2023 1636 Last data filed at 10/04/2023 2339 Gross per 24 hour  Intake --  Output 200 ml  Net -200 ml   Filed Weights   10/01/23 0502 10/02/23 0500 10/03/23 0052  Weight: 71.2 kg 70.2 kg 70.6 kg    Data Reviewed: I have personally reviewed and interpreted daily labs, tele strips, imagings as discussed above. I reviewed all nursing notes, pharmacy notes, vitals, pertinent old records I have discussed plan of care as described above with RN and patient/family.  CBC: Recent Labs  Lab 09/30/23 0518 10/03/23 0314 10/04/23 1100 10/05/23 0530  WBC 10.2 12.6* 5.7 11.4*  NEUTROABS  --   --  5.0   --   HGB 7.2* 7.3* 7.3* 7.0*  HCT 22.6* 23.1* 22.4* 22.1*  MCV 91.5 92.8 90.0 92.5  PLT 39* 28* 23* 25*   Basic Metabolic Panel: Recent Labs  Lab 09/30/23 0518 10/03/23 0314 10/04/23 1100 10/05/23 0530  NA 132* 130* 130* 132*  K 4.2 4.4 4.5 5.5*  CL 98 95* 96* 99  CO2 24 24 24 22   GLUCOSE 100* 110* 99 104*  BUN 12 19 22* 39*  CREATININE 0.55 0.49 0.63 1.86*  CALCIUM 8.2* 8.0* 8.2* 7.3*  MG 2.0 1.9  --  2.1  PHOS 3.4  --   --   --     Studies: No results found.  Scheduled Meds:  Chlorhexidine Gluconate Cloth  6 each Topical Daily   feeding supplement  237 mL Oral TID BM   heparin lock flush  500 Units Intracatheter Q30 days   hydrocortisone cream   Topical BID   sodium chloride flush  10-40 mL Intracatheter Q12H   sodium chloride  1 g Oral TID WC   Continuous Infusions:  acetaminophen     dextrose 5 % and 0.9 % NaCl 75 mL/hr at 10/05/23 1403   ondansetron (ZOFRAN) IV Stopped (09/28/23 0900)   PRN Meds: acetaminophen, acetaminophen **OR** acetaminophen, albuterol, ALPRAZolam, heparin lock flush **AND** heparin lock flush, HYDROmorphone (DILAUDID) injection, iodixanol, magnesium hydroxide, ondansetron (ZOFRAN) IV, mouth rinse, [DISCONTINUED] oxyCODONE-acetaminophen **AND** oxyCODONE, prochlorperazine, sodium chloride flush  Time spent: 35 minutes  Author: Gillis Santa. MD [facility] 10/05/2023 4:36 PM  To reach On-call, see care teams to locate the attending and reach out to them via [email or web address]. If 7PM-7AM, please contact night-coverage If you still have difficulty reaching the attending provider, please page the [facility] (Director on Call) for [facility] on amion for assistance.

## 2023-10-05 NOTE — Progress Notes (Signed)
The patient is in RED MEWS due to tachycardia and fever. The on-call nurse practitioner and charge RN were notified, and new orders were entered. The RED MEWS protocol will be followed accordingly to address the patient's condition.   10/05/23 0426  Assess: MEWS Score  Temp 98.3 F (36.8 C)  BP (!) 87/63  MAP (mmHg) 72  Pulse Rate (!) 141  ECG Heart Rate (!) 136  Resp (!) 32  Level of Consciousness Alert  SpO2 90 %  O2 Device Nasal Cannula  O2 Flow Rate (L/min) 5 L/min  Assess: MEWS Score  MEWS Temp 0  MEWS Systolic 1  MEWS Pulse 3  MEWS RR 2  MEWS LOC 0  MEWS Score 6  MEWS Score Color Red  Assess: if the MEWS score is Yellow or Red  Were vital signs accurate and taken at a resting state? Yes  Does the patient meet 2 or more of the SIRS criteria? Yes  Does the patient have a confirmed or suspected source of infection? No  MEWS guidelines implemented  Yes, red  Treat  MEWS Interventions Considered administering scheduled or prn medications/treatments as ordered  Take Vital Signs  Increase Vital Sign Frequency  Red: Q1hr x2, continue Q4hrs until patient remains green for 12hrs  Escalate  MEWS: Escalate Red: Discuss with charge nurse and notify provider. Consider notifying RRT. If remains red for 2 hours consider need for higher level of care  Notify: Charge Nurse/RN  Name of Charge Nurse/RN Notified Jode  Provider Notification  Provider Name/Title Modou, NP  Date Provider Notified 10/05/23  Time Provider Notified [phone]  Method of Notification Call;Page  Notification Reason Other (Comment) (RED MEWS)  Provider response See new orders  Date of Provider Response 10/05/23  Time of Provider Response 0458  Notify: Rapid Response  Name of Rapid Response RN Notified  (No need, NP notified)  Assess: SIRS CRITERIA  SIRS Temperature  0  SIRS Pulse 1  SIRS Respirations  1  SIRS WBC 0  SIRS Score Sum  2

## 2023-10-05 NOTE — Plan of Care (Signed)
  Problem: Education: Goal: Knowledge of General Education information will improve Description: Including pain rating scale, medication(s)/side effects and non-pharmacologic comfort measures Outcome: Not Progressing   Problem: Health Behavior/Discharge Planning: Goal: Ability to manage health-related needs will improve Outcome: Not Progressing   Problem: Clinical Measurements: Goal: Ability to maintain clinical measurements within normal limits will improve Outcome: Progressing Goal: Will remain free from infection Outcome: Progressing Goal: Diagnostic test results will improve Outcome: Progressing Goal: Respiratory complications will improve Outcome: Progressing Goal: Cardiovascular complication will be avoided Outcome: Progressing   Problem: Activity: Goal: Risk for activity intolerance will decrease Outcome: Not Progressing   Problem: Nutrition: Goal: Adequate nutrition will be maintained Outcome: Progressing   Problem: Coping: Goal: Level of anxiety will decrease Outcome: Progressing   Problem: Elimination: Goal: Will not experience complications related to bowel motility Outcome: Progressing Goal: Will not experience complications related to urinary retention Outcome: Progressing   Problem: Pain Management: Goal: General experience of comfort will improve Outcome: Progressing   Problem: Safety: Goal: Ability to remain free from injury will improve Outcome: Progressing   Problem: Skin Integrity: Goal: Risk for impaired skin integrity will decrease Outcome: Progressing   Problem: Cardiovascular: Goal: Ability to achieve and maintain adequate cardiovascular perfusion will improve Outcome: Progressing Goal: Vascular access site(s) Level 0-1 will be maintained Outcome: Progressing   Problem: Health Behavior/Discharge Planning: Goal: Ability to safely manage health-related needs after discharge will improve Outcome: Progressing

## 2023-10-05 NOTE — Progress Notes (Signed)
[facility]  [phone] [phone]  ID: Sheryl Silva OB: [birth year]  MR#: [id number]  AOZ#:[id number]  Patient Care Team: [name], MD as PCP - General (Internal Medicine) Benita Gutter, RN as Oncology Nurse Navigator Orlie Dakin, Tollie Pizza, MD as Consulting Physician (Oncology) Johnnette Barrios, RN as VBCI Care Management  CHIEF COMPLAINT: Metastatic signet ring adenocarcinoma, intractable pain, hyponatremia.  INTERVAL HISTORY: Patient more lethargic today.  Family at bedside.  Patient now on comfort care and awaiting bed at hospice home.    REVIEW OF SYSTEMS:   Review of Systems  Unable to perform ROS: Medical condition    PAST MEDICAL HISTORY: Past Medical History:  Diagnosis Date   Anxiety    Cancer (HCC)    Depression    Heart murmur    Hypertension     PAST SURGICAL HISTORY: Past Surgical History:  Procedure Laterality Date   BIOPSY  07/26/2023   Procedure: BIOPSY;  Surgeon: Toney Reil, MD;  Location: [facility] ENDOSCOPY;  Service: Gastroenterology;;   CESAREAN SECTION     COLONOSCOPY WITH PROPOFOL N/A 02/21/2023   Procedure: COLONOSCOPY WITH PROPOFOL;  Surgeon: Regis Bill, MD;  Location: [facility] ENDOSCOPY;  Service: Endoscopy;  Laterality: N/A;   DILATION AND CURETTAGE OF UTERUS     FLEXIBLE SIGMOIDOSCOPY N/A 07/26/2023   Procedure: FLEXIBLE SIGMOIDOSCOPY;  Surgeon: Toney Reil, MD;  Location: [facility] ENDOSCOPY;  Service: Gastroenterology;  Laterality: N/A;   IR IMAGING GUIDED PORT INSERTION  02/16/2023   IR PERC CHOLECYSTOSTOMY  09/09/2023   LOWER EXTREMITY ANGIOGRAPHY Left 09/27/2023   Procedure: Lower Extremity Angiography;  Surgeon: Renford Dills, MD;  Location: [facility] INVASIVE CV LAB;  Service: Cardiovascular;  Laterality: Left;    FAMILY HISTORY: Family History  Problem Relation Age of Onset   Cancer Maternal Grandmother     ADVANCED DIRECTIVES (Y/N):  @ADVDIR @  HEALTH  MAINTENANCE: Social History   Tobacco Use   Smoking status: Former    Current packs/day: 0.00    Types: Cigarettes    Quit date: 12/19/2022    Years since quitting: 0.7   Smokeless tobacco: Never   Tobacco comments:    Quit smoking 12/2022  Vaping Use   Vaping status: Never Used  Substance Use Topics   Alcohol use: Yes    Comment: occ   Drug use: No     Colonoscopy:  PAP:  Bone density:  Lipid panel:  Allergies  Allergen Reactions   Nifedipine Palpitations    Current Facility-Administered Medications  Medication Dose Route Frequency Provider Last Rate Last Admin   acetaminophen (OFIRMEV) IV 1,000 mg  1,000 mg Intravenous Q6H PRN Gillis Santa, MD       acetaminophen (TYLENOL) tablet 650 mg  650 mg Oral Q6H PRN Schnier, Latina Craver, MD   650 mg at 09/23/23 1037   Or   acetaminophen (TYLENOL) suppository 650 mg  650 mg Rectal Q6H PRN Schnier, Latina Craver, MD       albuterol (PROVENTIL) (2.5 MG/3ML) 0.083% nebulizer solution 2.5 mg  2.5 mg Inhalation Q6H PRN Schnier, Latina Craver, MD       ALPRAZolam Prudy Feeler) tablet 0.25 mg  0.25 mg Oral TID PRN Gilda Crease Latina Craver, MD   0.25 mg at 10/04/23 0551   Chlorhexidine Gluconate Cloth 2 % PADS 6 each  6 each Topical Daily Schnier, Latina Craver, MD   6 each at 10/04/23 0903   dextrose 5 %-0.9 % sodium chloride infusion   Intravenous Continuous Gillis Santa,  MD 75 mL/hr at 10/05/23 1403 New Bag at 10/05/23 1403   feeding supplement (ENSURE ENLIVE / ENSURE PLUS) liquid 237 mL  237 mL Oral TID BM Schnier, Latina Craver, MD   237 mL at 10/03/23 1312   heparin lock flush 100 unit/mL  500 Units Intracatheter Q30 days Sunnie Nielsen, DO       And   heparin lock flush 100 unit/mL  500 Units Intracatheter PRN Sunnie Nielsen, DO   500 Units at 10/03/23 1037   hydrocortisone cream 1 %   Topical BID Schnier, Latina Craver, MD   Given at 10/03/23 1030   HYDROmorphone (DILAUDID) injection 2 mg  2 mg Intravenous Q2H PRN Gillis Santa, MD   2 mg at 10/05/23 1529    iodixanol (VISIPAQUE) 320 MG/ML injection 5 mL  5 mL Other Once PRN Schnier, Latina Craver, MD       magnesium hydroxide (MILK OF MAGNESIA) suspension 30 mL  30 mL Oral Daily PRN Schnier, Latina Craver, MD   30 mL at 09/13/23 2112   ondansetron (ZOFRAN) 8 mg in sodium chloride 0.9 % 50 mL IVPB  8 mg Intravenous Q8H PRN Schnier, Latina Craver, MD   Stopped at 09/28/23 0900   Oral care mouth rinse  15 mL Mouth Rinse PRN Schnier, Latina Craver, MD       oxyCODONE (Oxy IR/ROXICODONE) immediate release tablet 15 mg  15 mg Oral Q4H PRN Schnier, Latina Craver, MD   15 mg at 10/04/23 1148   prochlorperazine (COMPAZINE) injection 5 mg  5 mg Intravenous Q6H PRN Schnier, Latina Craver, MD   5 mg at 09/28/23 1026   sodium chloride flush (NS) 0.9 % injection 10-40 mL  10-40 mL Intracatheter Q12H Sunnie Nielsen, DO   10 mL at 10/04/23 2058   sodium chloride flush (NS) 0.9 % injection 10-40 mL  10-40 mL Intracatheter PRN Sunnie Nielsen, DO       sodium chloride tablet 1 g  1 g Oral TID WC Schnier, Latina Craver, MD   1 g at 10/04/23 0901   Facility-Administered Medications Ordered in Other Encounters  Medication Dose Route Frequency Provider Last Rate Last Admin   heparin lock flush 100 unit/mL  500 Units Intravenous Once Borders, Ivin Booty R, NP       sodium chloride flush (NS) 0.9 % injection 10 mL  10 mL Intracatheter PRN Jeralyn Ruths, MD   10 mL at 06/29/23 1430    OBJECTIVE: Vitals:   10/05/23 1233 10/05/23 1530  BP: 99/73 106/72  Pulse: (!) 39 (!) 115  Resp:    Temp:    SpO2: 90%      Body mass index is 25.12 kg/m.    ECOG FS:4 - Bedbound   LAB RESULTS:  Lab Results  Component Value Date   NA 132 (L) 10/05/2023   K 5.5 (H) 10/05/2023   CL 99 10/05/2023   CO2 22 10/05/2023   GLUCOSE 104 (H) 10/05/2023   BUN 39 (H) 10/05/2023   CREATININE 1.86 (H) 10/05/2023   CALCIUM 7.3 (L) 10/05/2023   PROT 6.5 09/14/2023   ALBUMIN 2.4 (L) 09/14/2023   AST 30 09/14/2023   ALT 34 09/14/2023   ALKPHOS 516 (H)  09/14/2023   BILITOT 0.9 09/14/2023   GFRNONAA 33 (L) 10/05/2023   GFRAA >60 09/27/2018    Lab Results  Component Value Date   WBC 11.4 (H) 10/05/2023   NEUTROABS 5.0 10/04/2023   HGB 7.0 (L) 10/05/2023   HCT 22.1 (L)  10/05/2023   MCV 92.5 10/05/2023   PLT 25 (LL) 10/05/2023     STUDIES: US THORACENTESIS ASP PLEURAL SPACE W/IMG GUIDE  Result Date: 10/04/2023 INDICATION: Metastatic signet ring adenocarcinoma with left pleural effusion. Request for diagnostic and therapeutic thoracentesis. EXAM: ULTRASOUND GUIDED LEFT THORACENTESIS MEDICATIONS: 1% lidocaine 10 mL COMPLICATIONS: None immediate. PROCEDURE: An ultrasound guided thoracentesis was thoroughly discussed with the patient and questions answered. The benefits, risks, alternatives and complications were also discussed. The patient understands and wishes to proceed with the procedure. Written consent was obtained. Ultrasound was performed to localize and mark an adequate pocket of fluid in the left chest. The area was then prepped and draped in the normal sterile fashion. 1% Lidocaine was used for local anesthesia. Under ultrasound guidance a 6 Fr Safe-T-Centesis catheter was introduced. Thoracentesis was performed. The catheter was removed and a dressing applied. FINDINGS: A total of approximately 600 mL of clear amber fluid was removed. Samples were sent to the laboratory as requested by the clinical team. IMPRESSION: Successful ultrasound guided left thoracentesis yielding 600 mL of pleural fluid. No pneumothorax on post-procedure chest x-ray. Procedure performed by: Corrin Parker, PA-C Electronically Signed   By: Gilmer Mor D.O.   On: 10/04/2023 13:54   DG Chest Port 1 View  Result Date: 10/04/2023 CLINICAL DATA:  Pleural effusion.  Status post thoracentesis. EXAM: PORTABLE CHEST 1 VIEW COMPARISON:  October 03, 2023. FINDINGS: No pneumothorax is noted status post thoracentesis. No significant pleural effusion is noted currently.  Diffuse osseous metastatic disease is noted. IMPRESSION: No pneumothorax status post thoracentesis. Electronically Signed   By: Lupita Raider M.D.   On: 10/04/2023 13:05   CT Angio Chest Pulmonary Embolism (PE) W or WO Contrast  Result Date: 10/03/2023 CLINICAL DATA:  Pulmonary embolus suspected with high probability. Shortness of breath and tachycardia. Cancer patient. EXAM: CT ANGIOGRAPHY CHEST WITH CONTRAST TECHNIQUE: Multidetector CT imaging of the chest was performed using the standard protocol during bolus administration of intravenous contrast. Multiplanar CT image reconstructions and MIPs were obtained to evaluate the vascular anatomy. RADIATION DOSE REDUCTION: This exam was performed according to the departmental dose-optimization program which includes automated exposure control, adjustment of the mA and/or kV according to patient size and/or use of iterative reconstruction technique. CONTRAST:  75mL OMNIPAQUE IOHEXOL 350 MG/ML SOLN COMPARISON:  Chest radiograph 10/03/2023.  CT chest 08/20/2023 FINDINGS: Cardiovascular: Technically adequate study with good opacification of the central and segmental pulmonary arteries. No focal filling defects. No evidence of significant pulmonary embolus. Normal heart size. Small pericardial effusion. Normal caliber thoracic aorta. No aortic dissection. Great vessel origins are patent. Right central venous catheter with tip in the right atrium. Mediastinum/Nodes: Esophagus is decompressed. Thyroid gland is unremarkable. Prominent lymph nodes are demonstrated in the axilla bilaterally and extending into the supraclavicular regions. Largest left axillary lymph nodes measure 2.3 cm short axis dimension. Scattered mediastinal lymph nodes are also mildly enlarged. These are likely metastatic. Similar appearance to previous study. Lungs/Pleura: Large left pleural effusion. Atelectasis or consolidation in both lung bases, greater on the left. Patchy airspace disease  demonstrated throughout the remainder of both lungs. This could represent multifocal pneumonia or aspiration. The parenchymal process is progressing since the prior study. Upper Abdomen: Moderate upper abdominal ascites. Poorly defined hypodense lesion demonstrated in the lateral segment left lobe of liver measuring 2 cm diameter. This is incompletely included but may represent a metastatic focus. Musculoskeletal: Diffuse heterogeneous sclerosis throughout the visualized skeleton consistent with diffuse bone metastasis. Review  of the MIP images confirms the above findings. IMPRESSION: 1. No evidence of significant pulmonary embolus. 2. Large left pleural effusion. Basilar atelectasis or consolidation in both lower lungs. 3. Progressing patchy airspace disease throughout both lungs likely representing multifocal pneumonia or possibly aspiration. 4. Metastatic lymphadenopathy in the chest is similar to prior study. 5. Focal liver lesion is possibly metastatic. Incompletely included within this study. 6. Moderate upper abdominal ascites.  Small pericardial effusion. 7. Diffuse skeletal metastasis. Electronically Signed   By: Burman Nieves M.D.   On: 10/03/2023 22:40   DG Chest Port 1 View  Result Date: 10/03/2023 CLINICAL DATA:  Fever EXAM: PORTABLE CHEST 1 VIEW COMPARISON:  09/18/2023 FINDINGS: Right-sided central venous port tip at the right atrium. Low lung volumes. Increasing consolidation at left base. Small left-sided pleural effusion. Stable cardiomediastinal silhouette. Diffuse sclerosis corresponding to history of metastatic disease. IMPRESSION: Increasing consolidation at left base, atelectasis versus pneumonia with interim small left effusion. Electronically Signed   By: Jasmine Pang M.D.   On: 10/03/2023 21:12   PERIPHERAL VASCULAR CATHETERIZATION  Result Date: 09/27/2023 See surgical note for result.  US ARTERIAL ABI (SCREENING LOWER EXTREMITY)  Result Date: 09/24/2023 CLINICAL DATA:   47 year old female with left toe pain. EXAM: NONINVASIVE PHYSIOLOGIC VASCULAR STUDY OF BILATERAL LOWER EXTREMITIES TECHNIQUE: Evaluation of both lower extremities were performed at rest, including calculation of ankle-brachial indices with single level Doppler, pressure and pulse volume recording. COMPARISON:  None Available. FINDINGS: Right ABI:  0.97 Left ABI:  0.60 Right Lower Extremity:  Normal arterial waveforms at the ankle. Left Lower Extremity:  Monophasic arterial waveforms at the ankle. IMPRESSION: 1. Left ankle-brachial index of 0.60, consistent with moderate arterial occlusive disease. 2. Normal right ankle-brachial index. Marliss Coots, MD [facility]iology Electronically Signed   By: Marliss Coots M.D.   On: 09/24/2023 07:23   DG Chest Port 1 View  Result Date: 09/18/2023 CLINICAL DATA:  Metastatic adenocarcinoma, dyspnea. EXAM: PORTABLE CHEST 1 VIEW COMPARISON:  08/20/2023. FINDINGS: Unremarkable cardiac silhouette. Right base consolidation or volume loss. Right-sided Port-A-Cath tip distal SVC. No pneumothorax. There may be small right-sided pleural effusion. Diffuse osteoblastic metastatic disease. IMPRESSION: Right base consolidation or volume loss. Possible small right-sided diffuse osteoblastic metastatic disease. Electronically Signed   By: Layla Maw M.D.   On: 09/18/2023 10:32   US PELVIC TRANSABD W/PELVIC DOPPLER  Result Date: 09/11/2023 CLINICAL DATA:  Left groin pain. History of stage for metastatic adenocarcinoma of the GI tract. EXAM: TRANSABDOMINAL AND TRANSVAGINAL ULTRASOUND OF PELVIS DOPPLER ULTRASOUND OF OVARIES TECHNIQUE: Both transabdominal and transvaginal ultrasound examinations of the pelvis were performed. Transabdominal technique was performed for global imaging of the pelvis including uterus, ovaries, adnexal regions, and pelvic cul-de-sac. It was necessary to proceed with endovaginal exam following the  transabdominal exam to visualize the uterus and ovaries. Color and duplex Doppler ultrasound was utilized to evaluate blood flow to the ovaries. COMPARISON:  CT AP 09/08/2023 FINDINGS: Uterus Measurements: 8.2 x 4.0 x 5.6 cm = volume: 96 mL. No fibroids or other mass visualized. Endometrium Thickness: 4.1 mm.  No focal abnormality visualized. Right ovary Not visualized. Left ovary Left adnexal mass and left ovary are indistinguishable measuring 5.3 x 5.9 x 5.5 cm. Pulsed Doppler evaluation of both ovaries demonstrates There is decreased color Doppler flow to the left adnexal mass. No arterial waveforms identified within left adnexal mass/left ovary. Normal venous waveforms were noted. Other findings Small volume of free fluid noted within the pelvis.  IMPRESSION: 1. Left adnexal mass and left ovary are indistinguishable measuring 5.3 x 5.9 x 5.5 cm. There is decreased color Doppler flow to the left adnexal mass. No arterial waveforms identified within left adnexal mass/left ovary. Normal venous waveforms were noted. Imaging findings are indeterminate but torsion cannot be excluded. 2. Small volume of free fluid noted within the pelvis. 3. Right ovary not visualized. Electronically Signed   By: Signa Kell M.D.   On: 09/11/2023 16:21   IR Perc Cholecystostomy  Result Date: 09/10/2023 INDICATION: Acute cholecystitis EXAM: Placement of percutaneous cholecystostomy tube using ultrasound and fluoroscopic guidance MEDICATIONS: Documented in the EMR ANESTHESIA/SEDATION: Moderate (conscious) sedation was employed during this procedure. A total of Versed 2 mg and Fentanyl 100 mcg was administered intravenously by the radiology nurse. Total intra-service moderate Sedation Time: 18 minutes. The patient's level of consciousness and vital signs were monitored continuously by radiology nursing throughout the procedure under my direct supervision. FLUOROSCOPY: Radiation Exposure Index (as provided by the fluoroscopic  device): 1.5 minutes (8 mGy) COMPLICATIONS: None immediate. PROCEDURE: Informed written consent was obtained from the patient after a thorough discussion of the procedural risks, benefits and alternatives. All questions were addressed. Maximal Sterile Barrier Technique was utilized including caps, mask, sterile gowns, sterile gloves, sterile drape, hand hygiene and skin antiseptic. A timeout was performed prior to the initiation of the procedure. The patient was placed supine on the exam table. The right upper quadrant was prepped and draped in the standard sterile fashion. Ultrasound of the right upper quadrant was performed for planning purposes. This again demonstrated a distended gallbladder with wall edema and sludge, consistent with acute cholecystitis. An infracostal transhepatic approach was planned. Skin entry site was marked, and local analgesia was obtained with 1% lidocaine. Under ultrasound guidance, percutaneous access was obtained into the gallbladder via an infracostal transhepatic approach using a 21 gauge Chiba needle. Access was confirmed with visualization of needle tip within the gallbladder lumen, and free return of bile. An 018 Nitrex wire was then advanced through the access needle and coiled within the gallbladder lumen. A transition dilator was advanced over this wire, through which an antegrade cholecystogram was performed. Antegrade cholecystogram demonstrated appropriate location in the gallbladder lumen and a distended gallbladder with gallstones. The cystic duct was not visualized. Over an Amplatz wire, the percutaneous tract was serially dilated followed by placement of a 10 French locking multipurpose drainage catheter into the gallbladder lumen. Locking loop was formed. Additional biliary sludge was drained. Gentle hand injection of contrast material confirmed location of the gallbladder lumen. The drainage catheter was secured to the skin using silk suture and a dressing. It was  placed to bag drainage. The patient tolerated the procedure well without immediate complication. IMPRESSION: Successful placement of a 10 French percutaneous cholecystostomy tube. Cholecystostomy tube placed to bag drainage. Additional follow-up recommendations per referring surgical service. Electronically Signed   By: Olive Bass M.D.   On: 09/10/2023 11:02   CT ABDOMEN PELVIS W CONTRAST  Result Date: 09/08/2023 CLINICAL DATA:  Elevated liver enzymes, nausea and diarrhea for 3 days, cholelithiasis with acute cholecystitis on previous imaging, history of metastatic adrenal carcinoma EXAM: CT ABDOMEN AND PELVIS WITH CONTRAST TECHNIQUE: Multidetector CT imaging of the abdomen and pelvis was performed using the standard protocol following bolus administration of intravenous contrast. RADIATION DOSE REDUCTION: This exam was performed according to the departmental dose-optimization program which includes automated exposure control, adjustment of the mA and/or kV according to patient size and/or use of  iterative reconstruction technique. CONTRAST:  OMNIPAQUE IOHEXOL 300 MG/ML  SOLN COMPARISON:  08/21/2023, 08/20/2023 FINDINGS: Lower chest: Stable bibasilar hypoventilatory changes and scarring. No acute pleural or parenchymal lung disease. Hepatobiliary: There is a new 1 cm hypodensity within the left lobe liver, corresponding to hypoechoic lesion seen on today's ultrasound. Given history of metastatic adrenal carcinoma, new metastatic lesion is suspected. Multiple gallstones are again identified, with progressive gallbladder wall thickening and intramural edema consistent with acute cholecystitis. No evidence of choledocholithiasis. Pancreas: Unremarkable. No pancreatic ductal dilatation or surrounding inflammatory changes. Spleen: Normal in size without focal abnormality. Adrenals/Urinary Tract: The adrenals are unremarkable. The kidneys enhance normally. There is new mild left-sided hydronephrosis, with  mucosal enhancement at the left UPJ suggesting obstructing mucosal lesion, reference image 43/2. No right-sided obstruction. No urinary tract calculi. The bladder is unremarkable. Stomach/Bowel: No bowel obstruction or ileus. Normal appendix right lower quadrant. No bowel wall thickening or inflammatory change. Vascular/Lymphatic: Stable atherosclerosis of the aorta. There is persistent retroperitoneal lymphadenopathy consistent with metastatic disease. Index left para-aortic lymph node image 44/2 measures 8 mm in short axis, stable. No new adenopathy. Reproductive: 5.1 x 3.9 cm left adnexal mass not appreciably changed since prior exam. Right ovary is unremarkable and stable. Stable uterine fibroid. Other: Trace pelvic free fluid unchanged. No free intraperitoneal gas. No abdominal wall hernia. Musculoskeletal: Diffuse sclerosis throughout the visualized bony structures compatible with bony metastases. No acute or pathologic fracture. Reconstructed images demonstrate no additional findings. IMPRESSION: 1. Cholelithiasis with progressive gallbladder wall thickening and intramural edema consistent with worsening acute cholecystitis. 2. New 1 cm hypodensity within the left lobe liver, concerning for new liver metastasis. This was not evident on recent CT or MRI. 3. Persistent metastatic disease, with diffuse sclerotic bony metastases and retroperitoneal adenopathy unchanged. 4. Stable indeterminate left adnexal mass. 5. New mild left hydronephrosis, with subtle mucosal enhancement within the left ureter at the UPJ. Mucosal lesion cannot be excluded, and urologic consultation may be useful. 6.  Aortic Atherosclerosis (ICD10-I70.0). Electronically Signed   By: Sharlet Salina M.D.   On: 09/08/2023 21:43   US Abdomen Limited RUQ (LIVER/GB)  Result Date: 09/08/2023 CLINICAL DATA:  Quadrant pain for 3 days EXAM: ULTRASOUND ABDOMEN LIMITED RIGHT UPPER QUADRANT COMPARISON:  08/20/2023 FINDINGS: Gallbladder: Multiple  shadowing gallstones are seen filling the gallbladder lumen. Gallbladder wall remains thickened measuring up to 8 mm, with prominent intramural edema now noted. Negative sonographic Murphy sign. Common bile duct: Diameter: 11 mm Liver: Normal liver echotexture. There is a circumscribed 1.3 cm hypoechoic area within the left lobe liver, without associated abnormality on prior CT or MRI. Portal vein is patent on color Doppler imaging with normal direction of blood flow towards the liver. Other: None. IMPRESSION: 1. Cholelithiasis, with worsening gallbladder wall thickening compatible with progressive acute cholecystitis. 2. Stable dilated common bile duct. 3. Indeterminate 1.3 cm hypoechoic area left lobe liver, not clearly seen on previous CT or MRI. Electronically Signed   By: Sharlet Salina M.D.   On: 09/08/2023 20:59    ASSESSMENT: Metastatic signet ring adenocarcinoma, intractable pain, hyponatremia.  PLAN:    Signet ring adenocarcinoma: Patient has not received chemotherapy since July 18, 2023.  Patient now comfort care.  Awaiting bed at hospice home. Intractable pain: Appreciate palliative care input.  Continue current narcotics as prescribed. Hyponatremia: No further labs necessary. Anemia/thrombocytopenia: No further labs necessary.  DNR/DNI  Jeralyn Ruths, MD   10/05/2023 3:37 PM

## 2023-10-05 NOTE — Progress Notes (Signed)
[facility]- [employer] engineer, contracting    Received request from Transitions of Care Manger for family interest in [facility]. Met with sister and daughter to confirm interest and explain services. Unfortunately [facility] is not able to offer a room today. Family and Transitions of Care Manager aware hospital liaison will follow up tomorrow or sooner if bed becomes available.   Please do not hesitate to call with any Hospice related questions. Thank you  [facility] Liaison [phone]

## 2023-10-06 DIAGNOSIS — R1114 Bilious vomiting: Secondary | ICD-10-CM | POA: Diagnosis not present

## 2023-10-06 LAB — CYTOLOGY - NON PAP

## 2023-10-06 MED ORDER — ACETAMINOPHEN 325 MG PO TABS: 650.0000 mg | ORAL_TABLET | Freq: Four times a day (QID) | ORAL | Status: DC | PRN

## 2023-10-06 NOTE — TOC Transition Note (Signed)
Transition of Care [facility]) - CM/SW Discharge Note   Patient Details  Name: Sheryl Silva MRN: [id number] Date of Birth: [birth year]  Transition of Care [facility]) CM/SW Contact:  Chapman Fitch, RN Phone Number: 10/06/2023, 1:59 PM   Clinical Narrative:      Notified that patient to discharge today to [facility] Ree Kida with [employer] is coordinating.  EMS packet and signed DNR on chart        Patient Goals and CMS Choice      Discharge Placement                         Discharge Plan and Services Additional resources added to the After Visit Summary for                                       Social Determinants of Health (SDOH) Interventions SDOH Screenings   Food Insecurity: No Food Insecurity (09/09/2023)  Recent Concern: Food Insecurity - Food Insecurity Present (07/22/2023)  Housing: Low Risk  (09/09/2023)  Transportation Needs: No Transportation Needs (09/09/2023)  Recent Concern: Transportation Needs - Unmet Transportation Needs (07/22/2023)  Utilities: Not At Risk (09/09/2023)  Alcohol Screen: Low Risk  (02/15/2023)  Depression (PHQ2-9): Low Risk  (02/14/2023)  Financial Resource Strain: Low Risk  (05/09/2023)   [facility] from [facility], [facility]  Recent Concern: Financial Resource Strain - Medium Risk (02/15/2023)  Physical Activity: Inactive (02/15/2023)  Social Connections: Socially Isolated (02/15/2023)  Stress: Stress Concern Present (02/15/2023)  Tobacco Use: Medium Risk (09/08/2023)     Readmission Risk Interventions    09/10/2023    1:52 PM 08/22/2023   10:03 PM 08/04/2023    8:47 AM  Readmission Risk Prevention Plan  Transportation Screening Complete Complete   PCP or Specialist Appt within 3-5 Days Complete Complete Complete  HRI or Home Care Consult Complete Complete   Social Work Consult for Recovery Care Planning/Counseling Complete --   Palliative Care Screening Not  Applicable Not Applicable   Medication Review Oceanographer) Complete Complete

## 2023-10-06 NOTE — TOC Progression Note (Signed)
Transition of Care [facility]) - Progression Note    Patient Details  Name: Sheryl Silva MRN: [id number] Date of Birth: April 13, 1976  Transition of Care [facility]) CM/SW Contact  Chapman Fitch, RN Phone Number: 10/06/2023, 11:07 AM  Clinical Narrative:      Per Ree Kida with [facility] hospice home has a bed today, however patient and family are wanting to discuss IV fluids before making a decision  EMS packet on chart DNR on chart for MD to sign      Expected Discharge Plan and Services                                               Social Determinants of Health (SDOH) Interventions SDOH Screenings   Food Insecurity: No Food Insecurity (09/09/2023)  Recent Concern: Food Insecurity - Food Insecurity Present (07/22/2023)  Housing: Low Risk  (09/09/2023)  Transportation Needs: No Transportation Needs (09/09/2023)  Recent Concern: Transportation Needs - Unmet Transportation Needs (07/22/2023)  Utilities: Not At Risk (09/09/2023)  Alcohol Screen: Low Risk  (02/15/2023)  Depression (PHQ2-9): Low Risk  (02/14/2023)  Financial Resource Strain: Low Risk  (05/09/2023)   Received from [facility], [facility]  Recent Concern: Financial Resource Strain - Medium Risk (02/15/2023)  Physical Activity: Inactive (02/15/2023)  Social Connections: Socially Isolated (02/15/2023)  Stress: Stress Concern Present (02/15/2023)  Tobacco Use: Medium Risk (09/08/2023)    Readmission Risk Interventions    09/10/2023    1:52 PM 08/22/2023   10:03 PM 08/04/2023    8:47 AM  Readmission Risk Prevention Plan  Transportation Screening Complete Complete   PCP or Specialist Appt within 3-5 Days Complete Complete Complete  HRI or Home Care Consult Complete Complete   Social Work Consult for Recovery Care Planning/Counseling Complete --   Palliative Care Screening Not Applicable Not Applicable   Medication Review Oceanographer) Complete Complete

## 2023-10-06 NOTE — Progress Notes (Signed)
Unable to establish stable pleth for o2 sat r/t acrylic nails. Oxygen sat of 72, inaccurate. Patient is on 4 lpm . Resting quietly. Fingers and toes cool. MD made aware.  Cornell Barman Vasti Yagi

## 2023-10-06 NOTE — Progress Notes (Signed)
OT Cancellation Note  Patient Details Name: Sheryl Silva MRN: [id number] DOB: May 16, 1976   Cancelled Treatment:    Reason Eval/Treat Not Completed: Other (comment) (per notes, pt has transitioned to comfort care. OT will sign off at this time.)  Oleta Mouse, OTD OTR/L  10/06/23, 8:10 AM

## 2023-10-06 NOTE — Progress Notes (Signed)
Discharging to hospice. Report called to Hospice. [facility] port accessed, ok'd for discharge with port accessed per MD. Transporting via EMS.  [name]

## 2023-10-06 NOTE — Plan of Care (Signed)
  Problem: Education: Goal: Knowledge of General Education information will improve Description: Including pain rating scale, medication(s)/side effects and non-pharmacologic comfort measures Outcome: Progressing   Problem: Health Behavior/Discharge Planning: Goal: Ability to manage health-related needs will improve Outcome: Not Progressing   Problem: Clinical Measurements: Goal: Ability to maintain clinical measurements within normal limits will improve Outcome: Progressing Goal: Will remain free from infection Outcome: Progressing Goal: Cardiovascular complication will be avoided Outcome: Progressing   Problem: Activity: Goal: Risk for activity intolerance will decrease Outcome: Progressing   Problem: Nutrition: Goal: Adequate nutrition will be maintained Outcome: Not Progressing   Problem: Coping: Goal: Level of anxiety will decrease Outcome: Not Progressing   Problem: Elimination: Goal: Will not experience complications related to urinary retention Outcome: Progressing   Problem: Pain Management: Goal: General experience of comfort will improve Outcome: Progressing   Problem: Skin Integrity: Goal: Risk for impaired skin integrity will decrease Outcome: Progressing   Problem: Education: Goal: Understanding of CV disease, CV risk reduction, and recovery process will improve Outcome: Progressing Goal: Individualized Educational Video(s) Outcome: Progressing   Problem: Activity: Goal: Ability to return to baseline activity level will improve Outcome: Progressing

## 2023-10-06 NOTE — Discharge Summary (Signed)
[facility] Discharge Summary   Patient: Sheryl Silva WUJ:[id number]  PCP: [name], MD  Date of admission: 09/08/2023   Date of discharge:  10/06/2023     Discharge Diagnoses:  Principal Problem:   Nausea and vomiting Active Problems:   Hypertension   Signet ring cell adenocarcinoma (HCC)   Acute cholecystitis   Transaminitis   Metastatic signet ring cell carcinoma (HCC)   Pelvic mass   Generalized abdominal pain   PAD (peripheral artery disease) (HCC)   Atherosclerosis of native artery of left lower extremity with gangrene (HCC)   Admitted From: Home Disposition: Inpatient hospice care facility  Recommendations for Outpatient Follow-up:  Hospice care Follow up LABS/TEST:  None Diet recommendation: Advance as tolerated  Activity: The patient is advised to gradually reintroduce usual activities, as tolerated  Discharge Condition: stable  Code Status: DNR/DNI/comfort care  History of present illness: As per the H and P dictated on admission Hospital Course:  Sheryl Silva is a 47 y.o. 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  Also with recent hospitalization for CMV colitis and has been managed on valganciclovir by ID presented to the emergency room with abnormal labs, generalized abdominal pain, nausea, poor appetite.     Hospital course / significant events:  11/07: admitted to hospitalist service w/ acute cholecystitis  11/08: chole drain placed  Worsening abdominal pain difficult to control, she was requesting IV pain meds (although she hadn't maximized her home oxycodone), later started on Dilaudid PCA which has since  weaned off.  Palliative care was consulted.   11/18: hyponatremia to 119, to SDU for hypertonic saline, nephrology consulted  Sodium improving/stable Awaiting SNF in [place] to be closer to family, TOC working on this see notes from today 10/03/23  12/02: temp and HR up, WBC 12.6, infectious w/u --> (+)pneumonia started Zosyn, (+)pleural effusion, also ruled out PE.  12/03: thoracentesis yield 600 mL. Palliative d/w patient, code status updated to DNR   12/4 d/w family and agreed with hospice care placement.  Patient got excepted, awaiting for bed availability.  Comfort measure orders placed, RN may pronounce death.  Discontinued unnecessary medications, keep her comfortable and continue IV fluid and IV medications for pain control. 12/5 patient seems stable to transfer to hospice care today.    Assessment and Plan:   Comfort measures only started on 12/4 Discontinued all unnecessary medications, S/p IV fluid for hydration and continue IV pain medication for comfort Transfer to hospice care today   Patient was admitted on 09/08/2023 and I started taking care of her on 10/05/2023 During hospital stay patient was managed as below.   # Sepsis d/t HCAP Presented with Fever, malaise, leukocytosis  S/p Zosyn, continue supplemental O2 for comfort # L pleural effusion: s/p Thoracentesis, malignant vs d/t pneumonia  # Acute cholecystitis: S/p chole tube 11/08 Concern of acute cholecystitis complicated by history of stage IV GI signet cell adenocarcinoma completed 14 days of zosyn. not a good candidate for surgical intervention.  S/p cholecystostomy drain placement by IR on 11/8. cont drain management # left groin pain likely due to ovarian mass: Complaining of worsening pain was started on PCA dilaudid on 11/9, however, pain does not appear to be from upper abdomen.  possibly due to left  adnexal mass that caused torsion and cut off blood supply to ovary.  US pelvic with doppler, which showed known  left ovary mass with reduced blood flow.  GYN was consulted.  Palliative care consulted.  Continue symptomatic management. # Cancer pain # Stage IV metastatic signet ring adenocarcinoma: Pt reported pain still uncontrolled despite being on a high dose opioids. S/p Fentanyl patch switched to oxycontin 60 mg BID with better pain control. S/p oxycontin 60 mg BID and oxycodone 15 mg PRN. 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  Most recent CMV DNA from 09/08/23 was neg.  Pt saw ID Dr. Rivka Safer on 09/08/23, holding ganciclovir while not on chemo.  But if she plans to start chemo she will have to be on suppressive therapy with 900mg  VALGanciclovir every day. # Anemia: s/p 2u pRBC so far this hospitalization. Hb 7.0, continue comfort measures # Thrombocytopenia, plts 25k, no active bleeding # Hyponatremia 2/2 SIADH: persistent since Aug 2024, nadir to 119 on 09/18/23, improve with 3% saline, however, Na dropped back down to 122 after stopping 3% saline. Na improving on current regimen, now low 130's. Cannot give tolvaptan given transaminitis. S/p salt tab, lasix 20 mg daily and fluid restriction 1500 ml daily # PAD with ischemic left 2nd toe S/p angiogram on 11/26 with stent placement ABI showed Left ankle-brachial index of 0.60, consistent with moderate arterial occlusive disease.   S/p ASA and plavix.  Continue comfort measures now # Constipation, resolved. Bowel regimen as needed # Hypokalemia: Resolved # Hyperkalemia due to renal failure/AKI: Continue comfort measures # Tachycardia: likely due to demand and cancer pain. S/p Lopressor    Body mass index is 25.12 kg/m.  Nutrition Problem: Increased nutrient needs Etiology: cancer and cancer related treatments Interventions: Ensure Enlive (each supplement provides 350kcal and 20 grams of protein), MVI, Liberalize Diet   Overall poor prognosis, palliative care help appreciated.  Family  decided for hospice care.  Patient is stable to transfer to hospice facility today.   Consultants: Nephrology, GYN, oncologist, general surgery, IR Procedures:  11/08: Chole tube placement 10 Fr to bag drain - Dr. Juliette Alcide (IR) 11/26: angioplasty and stent placement left common iliac artery - Dr Gilda Crease (Vascular) 12/03: L thoracentesis   Discharge Exam: General: Mild to moderate respiratory distress due to anxiety  Cardiovascular: S1 and S2 Present, no Murmur, Respiratory: Shortness of breath, increased respiratory rate due to anxiety, bilateral crackles, no wheezing  Abdomen: Bowel Sound present, Soft and Mild tenderness Extremities: mild Pedal edema, no calf tenderness Neurology: alert and oriented to time, place, and person affect anxious and depressed  Filed Weights   10/01/23 0502 10/02/23 0500 10/03/23 0052  Weight: 71.2 kg 70.2 kg 70.6 kg   Vitals:   10/05/23 1957 10/06/23 0742  BP: 118/85 (!) 129/92  Pulse:  68  Resp: (!) 22 16  Temp: 98.6 F (37 C) 98.9 F (37.2 C)  SpO2:  (!) 72%    DISCHARGE MEDICATION: Allergies as of 10/06/2023       Reactions   Nifedipine Palpitations        Medication List     STOP taking these medications    albuterol 108 (90 Base) MCG/ACT inhaler Commonly known as: VENTOLIN HFA   amLODipine 10 MG tablet Commonly known as: NORVASC   cyanocobalamin 500 MCG tablet Commonly known as: VITAMIN B12   cyclobenzaprine 10 MG tablet Commonly known as: FLEXERIL   Doxepin HCl 6  MG Tabs   DULoxetine 60 MG capsule Commonly known as: CYMBALTA   feeding supplement Liqd   gabapentin 300 MG capsule Commonly known as: Neurontin   hydrocortisone 2.5 % cream   lidocaine-prilocaine cream Commonly known as: EMLA   megestrol 40 MG tablet Commonly known as: MEGACE   multivitamin tablet   naloxone 4 MG/0.1ML Liqd nasal spray kit Commonly known as: NARCAN   polyethylene glycol 17 g packet Commonly known as: MIRALAX / GLYCOLAX    prochlorperazine 10 MG tablet Commonly known as: COMPAZINE   traMADol 300 MG 24 hr tablet Commonly known as: ULTRAM-ER   valGANciclovir 450 MG tablet Commonly known as: VALCYTE   Vitamin D (Ergocalciferol) 1.25 MG (50000 UNIT) Caps capsule Commonly known as: DRISDOL       TAKE these medications    acetaminophen 325 MG tablet Commonly known as: TYLENOL Take 2 tablets (650 mg total) by mouth every 6 (six) hours as needed for mild pain (pain score 1-3), moderate pain (pain score 4-6), fever or headache (or Fever >/= 101).   ondansetron 8 MG tablet Commonly known as: Zofran Take 1 tablet (8 mg total) by mouth every 8 (eight) hours as needed for nausea or vomiting. Start on the third day after chemotherapy.   oxyCODONE-acetaminophen 10-325 MG tablet Commonly known as: PERCOCET Take 1 tablet by mouth every 6 (six) hours as needed for pain.       Allergies  Allergen Reactions   Nifedipine Palpitations   Discharge Instructions     Diet - low sodium heart healthy   Complete by: As directed    Discharge instructions   Complete by: As directed    Follow with hospice care   Increase activity slowly   Complete by: As directed        The results of significant diagnostics from this hospitalization (including imaging, microbiology, ancillary and laboratory) are listed below for reference.    Significant Diagnostic Studies: US THORACENTESIS ASP PLEURAL SPACE W/IMG GUIDE  Result Date: 10/04/2023 INDICATION: Metastatic signet ring adenocarcinoma with left pleural effusion. Request for diagnostic and therapeutic thoracentesis. EXAM: ULTRASOUND GUIDED LEFT THORACENTESIS MEDICATIONS: 1% lidocaine 10 mL COMPLICATIONS: None immediate. PROCEDURE: An ultrasound guided thoracentesis was thoroughly discussed with the patient and questions answered. The benefits, risks, alternatives and complications were also discussed. The patient understands and wishes to proceed with the procedure.  Written consent was obtained. Ultrasound was performed to localize and mark an adequate pocket of fluid in the left chest. The area was then prepped and draped in the normal sterile fashion. 1% Lidocaine was used for local anesthesia. Under ultrasound guidance a 6 Fr Safe-T-Centesis catheter was introduced. Thoracentesis was performed. The catheter was removed and a dressing applied. FINDINGS: A total of approximately 600 mL of clear amber fluid was removed. Samples were sent to the laboratory as requested by the clinical team. IMPRESSION: Successful ultrasound guided left thoracentesis yielding 600 mL of pleural fluid. No pneumothorax on post-procedure chest x-ray. Procedure performed by: Corrin Parker, PA-C Electronically Signed   By: Gilmer Mor D.O.   On: 10/04/2023 13:54   DG Chest Port 1 View  Result Date: 10/04/2023 CLINICAL DATA:  Pleural effusion.  Status post thoracentesis. EXAM: PORTABLE CHEST 1 VIEW COMPARISON:  October 03, 2023. FINDINGS: No pneumothorax is noted status post thoracentesis. No significant pleural effusion is noted currently. Diffuse osseous metastatic disease is noted. IMPRESSION: No pneumothorax status post thoracentesis. Electronically Signed   By: Lupita Raider M.D.   On:  10/04/2023 13:05   CT Angio Chest Pulmonary Embolism (PE) W or WO Contrast  Result Date: 10/03/2023 CLINICAL DATA:  Pulmonary embolus suspected with high probability. Shortness of breath and tachycardia. Cancer patient. EXAM: CT ANGIOGRAPHY CHEST WITH CONTRAST TECHNIQUE: Multidetector CT imaging of the chest was performed using the standard protocol during bolus administration of intravenous contrast. Multiplanar CT image reconstructions and MIPs were obtained to evaluate the vascular anatomy. RADIATION DOSE REDUCTION: This exam was performed according to the departmental dose-optimization program which includes automated exposure control, adjustment of the mA and/or kV according to patient size and/or use  of iterative reconstruction technique. CONTRAST:  75mL OMNIPAQUE IOHEXOL 350 MG/ML SOLN COMPARISON:  Chest radiograph 10/03/2023.  CT chest 08/20/2023 FINDINGS: Cardiovascular: Technically adequate study with good opacification of the central and segmental pulmonary arteries. No focal filling defects. No evidence of significant pulmonary embolus. Normal heart size. Small pericardial effusion. Normal caliber thoracic aorta. No aortic dissection. Great vessel origins are patent. Right central venous catheter with tip in the right atrium. Mediastinum/Nodes: Esophagus is decompressed. Thyroid gland is unremarkable. Prominent lymph nodes are demonstrated in the axilla bilaterally and extending into the supraclavicular regions. Largest left axillary lymph nodes measure 2.3 cm short axis dimension. Scattered mediastinal lymph nodes are also mildly enlarged. These are likely metastatic. Similar appearance to previous study. Lungs/Pleura: Large left pleural effusion. Atelectasis or consolidation in both lung bases, greater on the left. Patchy airspace disease demonstrated throughout the remainder of both lungs. This could represent multifocal pneumonia or aspiration. The parenchymal process is progressing since the prior study. Upper Abdomen: Moderate upper abdominal ascites. Poorly defined hypodense lesion demonstrated in the lateral segment left lobe of liver measuring 2 cm diameter. This is incompletely included but may represent a metastatic focus. Musculoskeletal: Diffuse heterogeneous sclerosis throughout the visualized skeleton consistent with diffuse bone metastasis. Review of the MIP images confirms the above findings. IMPRESSION: 1. No evidence of significant pulmonary embolus. 2. Large left pleural effusion. Basilar atelectasis or consolidation in both lower lungs. 3. Progressing patchy airspace disease throughout both lungs likely representing multifocal pneumonia or possibly aspiration. 4. Metastatic  lymphadenopathy in the chest is similar to prior study. 5. Focal liver lesion is possibly metastatic. Incompletely included within this study. 6. Moderate upper abdominal ascites.  Small pericardial effusion. 7. Diffuse skeletal metastasis. Electronically Signed   By: Burman Nieves M.D.   On: 10/03/2023 22:40   DG Chest Port 1 View  Result Date: 10/03/2023 CLINICAL DATA:  Fever EXAM: PORTABLE CHEST 1 VIEW COMPARISON:  09/18/2023 FINDINGS: Right-sided central venous port tip at the right atrium. Low lung volumes. Increasing consolidation at left base. Small left-sided pleural effusion. Stable cardiomediastinal silhouette. Diffuse sclerosis corresponding to history of metastatic disease. IMPRESSION: Increasing consolidation at left base, atelectasis versus pneumonia with interim small left effusion. Electronically Signed   By: Jasmine Pang M.D.   On: 10/03/2023 21:12   PERIPHERAL VASCULAR CATHETERIZATION  Result Date: 09/27/2023 See surgical note for result.  US ARTERIAL ABI (SCREENING LOWER EXTREMITY)  Result Date: 09/24/2023 CLINICAL DATA:  47 year old female with left toe pain. EXAM: NONINVASIVE PHYSIOLOGIC VASCULAR STUDY OF BILATERAL LOWER EXTREMITIES TECHNIQUE: Evaluation of both lower extremities were performed at rest, including calculation of ankle-brachial indices with single level Doppler, pressure and pulse volume recording. COMPARISON:  None Available. FINDINGS: Right ABI:  0.97 Left ABI:  0.60 Right Lower Extremity:  Normal arterial waveforms at the ankle. Left Lower Extremity:  Monophasic arterial waveforms at the ankle. IMPRESSION: 1. Left ankle-brachial  index of 0.60, consistent with moderate arterial occlusive disease. 2. Normal right ankle-brachial index. Marliss Coots, MD [facility] Electronically Signed   By: Marliss Coots M.D.   On: 09/24/2023 07:23   DG Chest Port 1 View  Result Date: 09/18/2023 CLINICAL DATA:   Metastatic adenocarcinoma, dyspnea. EXAM: PORTABLE CHEST 1 VIEW COMPARISON:  08/20/2023. FINDINGS: Unremarkable cardiac silhouette. Right base consolidation or volume loss. Right-sided Port-A-Cath tip distal SVC. No pneumothorax. There may be small right-sided pleural effusion. Diffuse osteoblastic metastatic disease. IMPRESSION: Right base consolidation or volume loss. Possible small right-sided diffuse osteoblastic metastatic disease. Electronically Signed   By: Layla Maw M.D.   On: 09/18/2023 10:32   US PELVIC TRANSABD W/PELVIC DOPPLER  Result Date: 09/11/2023 CLINICAL DATA:  Left groin pain. History of stage for metastatic adenocarcinoma of the GI tract. EXAM: TRANSABDOMINAL AND TRANSVAGINAL ULTRASOUND OF PELVIS DOPPLER ULTRASOUND OF OVARIES TECHNIQUE: Both transabdominal and transvaginal ultrasound examinations of the pelvis were performed. Transabdominal technique was performed for global imaging of the pelvis including uterus, ovaries, adnexal regions, and pelvic cul-de-sac. It was necessary to proceed with endovaginal exam following the transabdominal exam to visualize the uterus and ovaries. Color and duplex Doppler ultrasound was utilized to evaluate blood flow to the ovaries. COMPARISON:  CT AP 09/08/2023 FINDINGS: Uterus Measurements: 8.2 x 4.0 x 5.6 cm = volume: 96 mL. No fibroids or other mass visualized. Endometrium Thickness: 4.1 mm.  No focal abnormality visualized. Right ovary Not visualized. Left ovary Left adnexal mass and left ovary are indistinguishable measuring 5.3 x 5.9 x 5.5 cm. Pulsed Doppler evaluation of both ovaries demonstrates There is decreased color Doppler flow to the left adnexal mass. No arterial waveforms identified within left adnexal mass/left ovary. Normal venous waveforms were noted. Other findings Small volume of free fluid noted within the pelvis. IMPRESSION: 1. Left adnexal mass and left ovary are indistinguishable measuring 5.3 x 5.9 x 5.5 cm. There is  decreased color Doppler flow to the left adnexal mass. No arterial waveforms identified within left adnexal mass/left ovary. Normal venous waveforms were noted. Imaging findings are indeterminate but torsion cannot be excluded. 2. Small volume of free fluid noted within the pelvis. 3. Right ovary not visualized. Electronically Signed   By: Signa Kell M.D.   On: 09/11/2023 16:21   IR Perc Cholecystostomy  Result Date: 09/10/2023 INDICATION: Acute cholecystitis EXAM: Placement of percutaneous cholecystostomy tube using ultrasound and fluoroscopic guidance MEDICATIONS: Documented in the EMR ANESTHESIA/SEDATION: Moderate (conscious) sedation was employed during this procedure. A total of Versed 2 mg and Fentanyl 100 mcg was administered intravenously by the radiology nurse. Total intra-service moderate Sedation Time: 18 minutes. The patient's level of consciousness and vital signs were monitored continuously by radiology nursing throughout the procedure under my direct supervision. FLUOROSCOPY: Radiation Exposure Index (as provided by the fluoroscopic device): 1.5 minutes (8 mGy) COMPLICATIONS: None immediate. PROCEDURE: Informed written consent was obtained from the patient after a thorough discussion of the procedural risks, benefits and alternatives. All questions were addressed. Maximal Sterile Barrier Technique was utilized including caps, mask, sterile gowns, sterile gloves, sterile drape, hand hygiene and skin antiseptic. A timeout was performed prior to the initiation of the procedure. The patient was placed supine on the exam table. The right upper quadrant was prepped and draped in the standard sterile fashion. Ultrasound of the right upper quadrant was performed for planning purposes. This again demonstrated a distended gallbladder with wall edema and sludge, consistent with acute cholecystitis.  An infracostal transhepatic approach was planned. Skin entry site was marked, and local analgesia was  obtained with 1% lidocaine. Under ultrasound guidance, percutaneous access was obtained into the gallbladder via an infracostal transhepatic approach using a 21 gauge Chiba needle. Access was confirmed with visualization of needle tip within the gallbladder lumen, and free return of bile. An 018 Nitrex wire was then advanced through the access needle and coiled within the gallbladder lumen. A transition dilator was advanced over this wire, through which an antegrade cholecystogram was performed. Antegrade cholecystogram demonstrated appropriate location in the gallbladder lumen and a distended gallbladder with gallstones. The cystic duct was not visualized. Over an Amplatz wire, the percutaneous tract was serially dilated followed by placement of a 10 French locking multipurpose drainage catheter into the gallbladder lumen. Locking loop was formed. Additional biliary sludge was drained. Gentle hand injection of contrast material confirmed location of the gallbladder lumen. The drainage catheter was secured to the skin using silk suture and a dressing. It was placed to bag drainage. The patient tolerated the procedure well without immediate complication. IMPRESSION: Successful placement of a 10 French percutaneous cholecystostomy tube. Cholecystostomy tube placed to bag drainage. Additional follow-up recommendations per referring surgical service. Electronically Signed   By: Olive Bass M.D.   On: 09/10/2023 11:02   CT ABDOMEN PELVIS W CONTRAST  Result Date: 09/08/2023 CLINICAL DATA:  Elevated liver enzymes, nausea and diarrhea for 3 days, cholelithiasis with acute cholecystitis on previous imaging, history of metastatic adrenal carcinoma EXAM: CT ABDOMEN AND PELVIS WITH CONTRAST TECHNIQUE: Multidetector CT imaging of the abdomen and pelvis was performed using the standard protocol following bolus administration of intravenous contrast. RADIATION DOSE REDUCTION: This exam was performed according to the  departmental dose-optimization program which includes automated exposure control, adjustment of the mA and/or kV according to patient size and/or use of iterative reconstruction technique. CONTRAST:  OMNIPAQUE IOHEXOL 300 MG/ML  SOLN COMPARISON:  08/21/2023, 08/20/2023 FINDINGS: Lower chest: Stable bibasilar hypoventilatory changes and scarring. No acute pleural or parenchymal lung disease. Hepatobiliary: There is a new 1 cm hypodensity within the left lobe liver, corresponding to hypoechoic lesion seen on today's ultrasound. Given history of metastatic adrenal carcinoma, new metastatic lesion is suspected. Multiple gallstones are again identified, with progressive gallbladder wall thickening and intramural edema consistent with acute cholecystitis. No evidence of choledocholithiasis. Pancreas: Unremarkable. No pancreatic ductal dilatation or surrounding inflammatory changes. Spleen: Normal in size without focal abnormality. Adrenals/Urinary Tract: The adrenals are unremarkable. The kidneys enhance normally. There is new mild left-sided hydronephrosis, with mucosal enhancement at the left UPJ suggesting obstructing mucosal lesion, reference image 43/2. No right-sided obstruction. No urinary tract calculi. The bladder is unremarkable. Stomach/Bowel: No bowel obstruction or ileus. Normal appendix right lower quadrant. No bowel wall thickening or inflammatory change. Vascular/Lymphatic: Stable atherosclerosis of the aorta. There is persistent retroperitoneal lymphadenopathy consistent with metastatic disease. Index left para-aortic lymph node image 44/2 measures 8 mm in short axis, stable. No new adenopathy. Reproductive: 5.1 x 3.9 cm left adnexal mass not appreciably changed since prior exam. Right ovary is unremarkable and stable. Stable uterine fibroid. Other: Trace pelvic free fluid unchanged. No free intraperitoneal gas. No abdominal wall hernia. Musculoskeletal: Diffuse sclerosis throughout the visualized  bony structures compatible with bony metastases. No acute or pathologic fracture. Reconstructed images demonstrate no additional findings. IMPRESSION: 1. Cholelithiasis with progressive gallbladder wall thickening and intramural edema consistent with worsening acute cholecystitis. 2. New 1 cm hypodensity within the left lobe liver, concerning for  new liver metastasis. This was not evident on recent CT or MRI. 3. Persistent metastatic disease, with diffuse sclerotic bony metastases and retroperitoneal adenopathy unchanged. 4. Stable indeterminate left adnexal mass. 5. New mild left hydronephrosis, with subtle mucosal enhancement within the left ureter at the UPJ. Mucosal lesion cannot be excluded, and urologic consultation may be useful. 6.  Aortic Atherosclerosis (ICD10-I70.0). Electronically Signed   By: Sharlet Salina M.D.   On: 09/08/2023 21:43   US Abdomen Limited RUQ (LIVER/GB)  Result Date: 09/08/2023 CLINICAL DATA:  Quadrant pain for 3 days EXAM: ULTRASOUND ABDOMEN LIMITED RIGHT UPPER QUADRANT COMPARISON:  08/20/2023 FINDINGS: Gallbladder: Multiple shadowing gallstones are seen filling the gallbladder lumen. Gallbladder wall remains thickened measuring up to 8 mm, with prominent intramural edema now noted. Negative sonographic Murphy sign. Common bile duct: Diameter: 11 mm Liver: Normal liver echotexture. There is a circumscribed 1.3 cm hypoechoic area within the left lobe liver, without associated abnormality on prior CT or MRI. Portal vein is patent on color Doppler imaging with normal direction of blood flow towards the liver. Other: None. IMPRESSION: 1. Cholelithiasis, with worsening gallbladder wall thickening compatible with progressive acute cholecystitis. 2. Stable dilated common bile duct. 3. Indeterminate 1.3 cm hypoechoic area left lobe liver, not clearly seen on previous CT or MRI. Electronically Signed   By: Sharlet Salina M.D.   On: 09/08/2023 20:59    Microbiology: Recent Results (from  the past 240 hour(s))  Culture, blood (Routine X 2) w Reflex to ID Panel     Status: None (Preliminary result)   Collection Time: 10/03/23  5:27 PM   Specimen: BLOOD  Result Value Ref Range Status   Specimen Description   Final    BLOOD RIGHT ANTECUBITAL Performed at [facility], [address], [place], [place] [address]    Special Requests   Final    BOTTLES DRAWN AEROBIC AND ANAEROBIC Blood Culture adequate volume Performed at [facility], [address] [place], [place] [address]    Culture  Setup Time   Final    GRAM POSITIVE COCCI IN CLUSTERS AEROBIC BOTTLE ONLY Organism ID to follow CRITICAL RESULT CALLED TO, READ BACK BY AND VERIFIED WITH: WILL ANDERSON, PHARMD AT 1017 ON 10/05/23 BY GM PREVIOUSLY REPORTED AS: GRAM NEGATIVE DIPLOCOCCI CORRECTED RESULTS CALLED TO: PHARMD ERIN MEEGAN ON 10/05/23 @ 1740 BY DRT Performed at [facility], [address] [place], [place] [address]    Culture GRAM POSITIVE COCCI IN CLUSTERS  Final   Report Status PENDING  Incomplete  Blood Culture ID Panel (Reflexed)     Status: None   Collection Time: 10/03/23  5:27 PM  Result Value Ref Range Status   Enterococcus faecalis NOT DETECTED NOT DETECTED Final   Enterococcus Faecium NOT DETECTED NOT DETECTED Final   Listeria monocytogenes NOT DETECTED NOT DETECTED Final   Staphylococcus species NOT DETECTED NOT DETECTED Final   Staphylococcus aureus (BCID) NOT DETECTED NOT DETECTED Final   Staphylococcus epidermidis NOT DETECTED NOT DETECTED Final   Staphylococcus lugdunensis NOT DETECTED NOT DETECTED Final   Streptococcus species NOT DETECTED NOT DETECTED Final   Streptococcus agalactiae NOT DETECTED NOT DETECTED Final   Streptococcus pneumoniae NOT DETECTED NOT DETECTED Final   Streptococcus pyogenes NOT DETECTED NOT DETECTED Final   A.calcoaceticus-baumannii NOT DETECTED NOT DETECTED Final   Bacteroides fragilis NOT DETECTED NOT DETECTED Final   Enterobacterales NOT  DETECTED NOT DETECTED Final   Enterobacter cloacae complex NOT DETECTED NOT DETECTED Final  Escherichia coli NOT DETECTED NOT DETECTED Final   Klebsiella aerogenes NOT DETECTED NOT DETECTED Final   Klebsiella oxytoca NOT DETECTED NOT DETECTED Final   Klebsiella pneumoniae NOT DETECTED NOT DETECTED Final   Proteus species NOT DETECTED NOT DETECTED Final   Salmonella species NOT DETECTED NOT DETECTED Final   Serratia marcescens NOT DETECTED NOT DETECTED Final   Haemophilus influenzae NOT DETECTED NOT DETECTED Final   Neisseria meningitidis NOT DETECTED NOT DETECTED Final   Pseudomonas aeruginosa NOT DETECTED NOT DETECTED Final   Stenotrophomonas maltophilia NOT DETECTED NOT DETECTED Final   Candida albicans NOT DETECTED NOT DETECTED Final   Candida auris NOT DETECTED NOT DETECTED Final   Candida glabrata NOT DETECTED NOT DETECTED Final   Candida krusei NOT DETECTED NOT DETECTED Final   Candida parapsilosis NOT DETECTED NOT DETECTED Final   Candida tropicalis NOT DETECTED NOT DETECTED Final   Cryptococcus neoformans/gattii NOT DETECTED NOT DETECTED Final    Comment: Performed at [facility], [address] [place], [place] [address]  Culture, blood (Routine X 2) w Reflex to ID Panel     Status: None (Preliminary result)   Collection Time: 10/03/23 11:00 PM   Specimen: BLOOD  Result Value Ref Range Status   Specimen Description BLOOD BLOOD LEFT ARM LAC  Final   Special Requests   Final    BOTTLES DRAWN AEROBIC AND ANAEROBIC Blood Culture results may not be optimal due to an inadequate volume of blood received in culture bottles   Culture   Final    NO GROWTH 2 DAYS Performed at [facility], [address] [place], [place] [address]    Report Status PENDING  Incomplete  Body fluid culture w Gram Stain     Status: None (Preliminary result)   Collection Time: 10/04/23 12:52 PM   Specimen: PATH Cytology Pleural fluid  Result Value Ref Range Status   Specimen  Description   Final    PLEURAL Performed at [facility], [address] [place], [place] [address]    Special Requests   Final    NONE Performed at [facility], [address] [place], [place] [address]    Gram Stain   Final    WBC PRESENT, PREDOMINANTLY MONONUCLEAR NO ORGANISMS SEEN CYTOSPIN SMEAR    Culture   Final    NO GROWTH 2 DAYS Performed at [facility], [address], [place], [place] [address]    Report Status PENDING  Incomplete     Labs: CBC: Recent Labs  Lab 09/30/23 0518 10/03/23 0314 10/04/23 1100 10/05/23 0530  WBC 10.2 12.6* 5.7 11.4*  NEUTROABS  --   --  5.0  --   HGB 7.2* 7.3* 7.3* 7.0*  HCT 22.6* 23.1* 22.4* 22.1*  MCV 91.5 92.8 90.0 92.5  PLT 39* 28* 23* 25*   Basic Metabolic Panel: Recent Labs  Lab 09/30/23 0518 10/03/23 0314 10/04/23 1100 10/05/23 0530  NA 132* 130* 130* 132*  K 4.2 4.4 4.5 5.5*  CL 98 95* 96* 99  CO2 24 24 24 22   GLUCOSE 100* 110* 99 104*  BUN 12 19 22* 39*  CREATININE 0.55 0.49 0.63 1.86*  CALCIUM 8.2* 8.0* 8.2* 7.3*  MG 2.0 1.9  --  2.1  PHOS 3.4  --   --   --    Liver Function Tests: No results for input(s): "AST", "ALT", "ALKPHOS", "BILITOT", "PROT", "ALBUMIN" in the last 168 hours. No results for input(s): "LIPASE", "AMYLASE" in the  last 168 hours. No results for input(s): "AMMONIA" in the last 168 hours. Cardiac Enzymes: No results for input(s): "CKTOTAL", "CKMB", "CKMBINDEX", "TROPONINI" in the last 168 hours. BNP (last 3 results) Recent Labs    07/22/23 0225  BNP 33.9   CBG: Recent Labs  Lab 10/02/23 0249 10/05/23 1240  GLUCAP 98 78    Time spent: 35 minutes  Signed:  Gillis Santa  [facility] 10/06/2023 12:01 PM

## 2023-10-06 NOTE — Progress Notes (Signed)
[facility]- [employer], contracting   Patient discharging via EMS today to [facility].  Consents for admission have been signed.     Please don't hesitate to call with any Hospice related questions or concerns.    Thank you for the opportunity to participate in this patient's care. [facility] Liaison [phone]

## 2023-10-06 NOTE — Plan of Care (Signed)
Adequate for discharge.

## 2023-10-07 LAB — CULTURE, BLOOD (ROUTINE X 2): Special Requests: ADEQUATE

## 2023-10-08 LAB — CULTURE, BLOOD (ROUTINE X 2): Culture: NO GROWTH

## 2023-10-08 LAB — BODY FLUID CULTURE W GRAM STAIN: Culture: NO GROWTH

## 2023-10-10 ENCOUNTER — Ambulatory Visit: Payer: 59 | Admitting: Oncology

## 2023-10-10 ENCOUNTER — Other Ambulatory Visit: Payer: 59

## 2023-10-10 ENCOUNTER — Ambulatory Visit: Payer: 59

## 2023-11-01 NOTE — Progress Notes (Signed)
 [facility] Discharge  Member has been discharged from [facility]. If you have any questions, please contact our Clinical Team at [phone].  Name: Sheryl Silva, Sheryl Silva Date of Birth: 1976/08/24 Discharge Date: 2023-11-01 Discharged Reason: No longer eligible: Deceased Discharging to: Referring Specialist Referring Provider: Evalene Reusing, MD  Current Psychiatric Medications N/A  Referrals: N/A  Discharge Summary Member connected with onboarding specialists and Care Manager over the course of 07/18/2023 - 09/13/2023. We were unfortunately unable to complete an Intake due to Member being hospitalized.   Final Intake Summary Intake not complete.  [facility] Provider:  Rilley Stash [facility] Manager

## 2023-11-02 DEATH — deceased

## 2024-06-12 ENCOUNTER — Other Ambulatory Visit: Payer: Self-pay
